# Patient Record
Sex: Female | Born: 1960 | Race: Black or African American | Hispanic: No | Smoking: Former smoker
Health system: Southern US, Community
[De-identification: ages and names within clinical notes are randomized; demographics above are authoritative.]

## PROBLEM LIST (undated history)

## (undated) DIAGNOSIS — F329 Major depressive disorder, single episode, unspecified: Secondary | ICD-10-CM

## (undated) DIAGNOSIS — M1990 Unspecified osteoarthritis, unspecified site: Secondary | ICD-10-CM

## (undated) DIAGNOSIS — N183 Chronic kidney disease, stage 3 unspecified: Secondary | ICD-10-CM

## (undated) DIAGNOSIS — Z9889 Other specified postprocedural states: Secondary | ICD-10-CM

## (undated) DIAGNOSIS — I2699 Other pulmonary embolism without acute cor pulmonale: Secondary | ICD-10-CM

## (undated) DIAGNOSIS — I639 Cerebral infarction, unspecified: Secondary | ICD-10-CM

## (undated) DIAGNOSIS — F4310 Post-traumatic stress disorder, unspecified: Secondary | ICD-10-CM

## (undated) DIAGNOSIS — E119 Type 2 diabetes mellitus without complications: Secondary | ICD-10-CM

## (undated) DIAGNOSIS — J45909 Unspecified asthma, uncomplicated: Secondary | ICD-10-CM

## (undated) DIAGNOSIS — Z86711 Personal history of pulmonary embolism: Secondary | ICD-10-CM

## (undated) DIAGNOSIS — J189 Pneumonia, unspecified organism: Secondary | ICD-10-CM

## (undated) DIAGNOSIS — F319 Bipolar disorder, unspecified: Secondary | ICD-10-CM

## (undated) DIAGNOSIS — T7840XA Allergy, unspecified, initial encounter: Secondary | ICD-10-CM

## (undated) DIAGNOSIS — H409 Unspecified glaucoma: Secondary | ICD-10-CM

## (undated) DIAGNOSIS — G4730 Sleep apnea, unspecified: Secondary | ICD-10-CM

## (undated) DIAGNOSIS — I10 Essential (primary) hypertension: Secondary | ICD-10-CM

## (undated) DIAGNOSIS — I2510 Atherosclerotic heart disease of native coronary artery without angina pectoris: Secondary | ICD-10-CM

## (undated) DIAGNOSIS — I219 Acute myocardial infarction, unspecified: Secondary | ICD-10-CM

## (undated) DIAGNOSIS — F419 Anxiety disorder, unspecified: Secondary | ICD-10-CM

## (undated) DIAGNOSIS — K219 Gastro-esophageal reflux disease without esophagitis: Secondary | ICD-10-CM

## (undated) DIAGNOSIS — G709 Myoneural disorder, unspecified: Secondary | ICD-10-CM

## (undated) DIAGNOSIS — G629 Polyneuropathy, unspecified: Secondary | ICD-10-CM

## (undated) DIAGNOSIS — F32A Depression, unspecified: Secondary | ICD-10-CM

## (undated) DIAGNOSIS — H269 Unspecified cataract: Secondary | ICD-10-CM

## (undated) DIAGNOSIS — M549 Dorsalgia, unspecified: Secondary | ICD-10-CM

## (undated) DIAGNOSIS — D649 Anemia, unspecified: Secondary | ICD-10-CM

## (undated) DIAGNOSIS — F1410 Cocaine abuse, uncomplicated: Secondary | ICD-10-CM

## (undated) HISTORY — DX: Gastro-esophageal reflux disease without esophagitis: K21.9

## (undated) HISTORY — DX: Chronic kidney disease, stage 3 unspecified: N18.30

## (undated) HISTORY — DX: Unspecified cataract: H26.9

## (undated) HISTORY — DX: Allergy, unspecified, initial encounter: T78.40XA

## (undated) HISTORY — DX: Major depressive disorder, single episode, unspecified: F32.9

## (undated) HISTORY — PX: COLONOSCOPY W/ POLYPECTOMY: SHX1380

## (undated) HISTORY — DX: Cocaine abuse, uncomplicated: F14.10

## (undated) HISTORY — PX: COLONOSCOPY: SHX174

## (undated) HISTORY — DX: Cerebral infarction, unspecified: I63.9

## (undated) HISTORY — DX: Unspecified glaucoma: H40.9

## (undated) HISTORY — DX: Type 2 diabetes mellitus without complications: E11.9

## (undated) HISTORY — DX: Chronic kidney disease, stage 3 (moderate): N18.3

## (undated) HISTORY — DX: Anemia, unspecified: D64.9

## (undated) HISTORY — DX: Depression, unspecified: F32.A

## (undated) HISTORY — DX: Atherosclerotic heart disease of native coronary artery without angina pectoris: I25.10

## (undated) HISTORY — PX: DENTAL SURGERY: SHX609

## (undated) HISTORY — PX: TONSILLECTOMY: SUR1361

## (undated) HISTORY — PX: TUBAL LIGATION: SHX77

## (undated) HISTORY — DX: Sleep apnea, unspecified: G47.30

## (undated) HISTORY — DX: Other pulmonary embolism without acute cor pulmonale: I26.99

## (undated) HISTORY — DX: Dorsalgia, unspecified: M54.9

## (undated) HISTORY — PX: POLYPECTOMY: SHX149

## (undated) HISTORY — DX: Personal history of pulmonary embolism: Z86.711

## (undated) NOTE — Telephone Encounter (Signed)
Formatting of this note might be different from the original.  Received new consult from [facility] for worsening renal function. Room 136A  Electronically signed by Eddie Dibbles, LPN at 78/29/5621 12:31 PM EST

## (undated) NOTE — Telephone Encounter (Signed)
Formatting of this note might be different from the original.  Dr. Jomarie Longs of [facility]'s patient   Electronically signed by Officer, Maisie Fus, MD at 11/14/2021  4:14 PM EST

---

## 2003-09-12 DIAGNOSIS — I639 Cerebral infarction, unspecified: Secondary | ICD-10-CM

## 2003-09-12 HISTORY — DX: Cerebral infarction, unspecified: I63.9

## 2012-10-25 ENCOUNTER — Emergency Department (HOSPITAL_COMMUNITY): Payer: Medicaid - Out of State

## 2012-10-25 ENCOUNTER — Emergency Department (HOSPITAL_COMMUNITY)
Admission: EM | Admit: 2012-10-25 | Discharge: 2012-10-26 | Disposition: A | Discharge status: Home / Self Care | Payer: Medicaid - Out of State | Attending: Emergency Medicine | Admitting: Emergency Medicine

## 2012-10-25 ENCOUNTER — Encounter (HOSPITAL_COMMUNITY): Payer: Self-pay | Admitting: Emergency Medicine

## 2012-10-25 DIAGNOSIS — R51 Headache: Secondary | ICD-10-CM | POA: Insufficient documentation

## 2012-10-25 DIAGNOSIS — K219 Gastro-esophageal reflux disease without esophagitis: Secondary | ICD-10-CM | POA: Insufficient documentation

## 2012-10-25 DIAGNOSIS — R197 Diarrhea, unspecified: Secondary | ICD-10-CM | POA: Insufficient documentation

## 2012-10-25 DIAGNOSIS — I10 Essential (primary) hypertension: Secondary | ICD-10-CM | POA: Insufficient documentation

## 2012-10-25 DIAGNOSIS — E119 Type 2 diabetes mellitus without complications: Secondary | ICD-10-CM | POA: Insufficient documentation

## 2012-10-25 DIAGNOSIS — Z87891 Personal history of nicotine dependence: Secondary | ICD-10-CM | POA: Insufficient documentation

## 2012-10-25 DIAGNOSIS — Z8701 Personal history of pneumonia (recurrent): Secondary | ICD-10-CM | POA: Insufficient documentation

## 2012-10-25 DIAGNOSIS — E7800 Pure hypercholesterolemia, unspecified: Secondary | ICD-10-CM | POA: Insufficient documentation

## 2012-10-25 DIAGNOSIS — R42 Dizziness and giddiness: Secondary | ICD-10-CM | POA: Insufficient documentation

## 2012-10-25 DIAGNOSIS — Z760 Encounter for issue of repeat prescription: Secondary | ICD-10-CM

## 2012-10-25 DIAGNOSIS — Z8669 Personal history of other diseases of the nervous system and sense organs: Secondary | ICD-10-CM | POA: Insufficient documentation

## 2012-10-25 DIAGNOSIS — Z79899 Other long term (current) drug therapy: Secondary | ICD-10-CM | POA: Insufficient documentation

## 2012-10-25 HISTORY — DX: Gastro-esophageal reflux disease without esophagitis: K21.9

## 2012-10-25 HISTORY — DX: Pneumonia, unspecified organism: J18.9

## 2012-10-25 HISTORY — DX: Polyneuropathy, unspecified: G62.9

## 2012-10-25 HISTORY — DX: Essential (primary) hypertension: I10

## 2012-10-25 MED ORDER — HYDROMORPHONE HCL PF 1 MG/ML IJ SOLN
1.0000 mg | Freq: Once | INTRAMUSCULAR | Status: AC
  Administered 2012-10-26: 1 mg via INTRAVENOUS
  Filled 2012-10-25: qty 1

## 2012-10-25 MED ORDER — SODIUM CHLORIDE 0.9 % IV SOLN: INTRAVENOUS | Status: DC

## 2012-10-25 MED ORDER — ONDANSETRON HCL 4 MG/2ML IJ SOLN
4.0000 mg | Freq: Once | INTRAMUSCULAR | Status: AC
  Administered 2012-10-26: 4 mg via INTRAVENOUS
  Filled 2012-10-25: qty 2

## 2012-10-25 MED ORDER — SODIUM CHLORIDE 0.9 % IV BOLUS (SEPSIS)
1000.0000 mL | Freq: Once | INTRAVENOUS | Status: AC
  Administered 2012-10-26: 1000 mL via INTRAVENOUS

## 2012-10-25 NOTE — ED Notes (Signed)
PT reports out of medication x 3 weeks.  Recently moved to [facility]- no PCP and No refills on medication.  Reports dizziness x 2 days that pt relates to not taking medication.  No neuro deficits noted.

## 2012-10-25 NOTE — ED Notes (Addendum)
Pt out of all medications, c/o dizziness today "and I about blacked out yesterday but I didn't come." Denies pain. Pt states dizziness is intermittent, denies any dizziness today. Pt states that nothing makes dizziness better or worse.

## 2012-10-26 LAB — CBC WITH DIFFERENTIAL/PLATELET
Eosinophils Absolute: 0.1 10*3/uL (ref 0.0–0.7)
Eosinophils Relative: 1 % (ref 0–5)
HCT: 35.5 % — ABNORMAL LOW (ref 36.0–46.0)
Hemoglobin: 11.9 g/dL — ABNORMAL LOW (ref 12.0–15.0)
Lymphs Abs: 3.1 10*3/uL (ref 0.7–4.0)
MCH: 29.7 pg (ref 26.0–34.0)
MCV: 88.5 fL (ref 78.0–100.0)
Monocytes Absolute: 0.7 10*3/uL (ref 0.1–1.0)
Monocytes Relative: 6 % (ref 3–12)
Neutrophils Relative %: 62 % (ref 43–77)
RBC: 4.01 MIL/uL (ref 3.87–5.11)

## 2012-10-26 LAB — BASIC METABOLIC PANEL
BUN: 27 mg/dL — ABNORMAL HIGH (ref 6–23)
GFR calc non Af Amer: 71 mL/min — ABNORMAL LOW (ref >90)
Glucose, Bld: 208 mg/dL — ABNORMAL HIGH (ref 70–99)
Potassium: 3.9 mEq/L (ref 3.5–5.1)

## 2012-10-26 LAB — URINALYSIS, ROUTINE W REFLEX MICROSCOPIC
Bilirubin Urine: NEGATIVE
Glucose, UA: NEGATIVE mg/dL
Hgb urine dipstick: NEGATIVE
Specific Gravity, Urine: 1.019 (ref 1.005–1.030)
pH: 5 (ref 5.0–8.0)

## 2012-10-26 LAB — URINE MICROSCOPIC-ADD ON

## 2012-10-26 MED ORDER — OMEPRAZOLE 20 MG PO CPDR: 20.0000 mg | DELAYED_RELEASE_CAPSULE | Freq: Every day | ORAL | Status: DC

## 2012-10-26 MED ORDER — HYDROMORPHONE HCL PF 1 MG/ML IJ SOLN: 1.0000 mg | Freq: Once | INTRAMUSCULAR | Status: DC

## 2012-10-26 MED ORDER — AMLODIPINE BESYLATE 5 MG PO TABS
5.0000 mg | ORAL_TABLET | Freq: Once | ORAL | Status: AC
  Administered 2012-10-26: 5 mg via ORAL
  Filled 2012-10-26: qty 1

## 2012-10-26 MED ORDER — GLYBURIDE 5 MG PO TABS: 2.5000 mg | ORAL_TABLET | Freq: Every day | ORAL | Status: DC

## 2012-10-26 MED ORDER — AMLODIPINE BESYLATE 10 MG PO TABS: 5.0000 mg | ORAL_TABLET | Freq: Every day | ORAL | Status: DC

## 2012-10-26 MED ORDER — ONDANSETRON HCL 8 MG PO TABS
4.0000 mg | ORAL_TABLET | Freq: Once | ORAL | Status: AC
  Administered 2012-10-26: 02:00:00 via ORAL
  Filled 2012-10-26: qty 2

## 2012-10-26 NOTE — ED Provider Notes (Signed)
History     CSN: [id number]  Arrival date & time 10/25/12  7829   First MD Initiated Contact with Patient 10/25/12 2309      Chief Complaint  Patient presents with  . Dizziness  . out of medication     (Consider location/radiation/quality/duration/timing/severity/associated sxs/prior treatment) The history is provided by the patient.   52 year old female new to the area is been a cone of her medications for the past 3 weeks. Has had gradual onset of dizziness and mild headache for the past several days. Worse yesterday. No chest pain no shortness of breath no abdominal pain. Has baseline loose bowel movements described as diarrhea. No nausea and vomiting positive urinary frequency. Headache is described as mild 5/10. All of the head.   Patient has a history of hypertension and diabetes. She does not have a doctor locally.  Past Medical History  Diagnosis Date  . Hypertension   . High cholesterol   . Diabetes mellitus without complication   . Neuropathy   . Acid reflux   . Pneumonia     Past Surgical History  Procedure Laterality Date  . Tubal ligation      No family history on file.  History  Substance Use Topics  . Smoking status: Former Games developer  . Smokeless tobacco: Not on file  . Alcohol Use: No    OB History   Grav Para Term Preterm Abortions TAB SAB Ect Mult Living                  Review of Systems  Constitutional: Negative for fever and diaphoresis.  HENT: Negative for ear pain, congestion and neck pain.   Eyes: Negative for visual disturbance.  Respiratory: Negative for shortness of breath.   Cardiovascular: Negative for chest pain and leg swelling.  Gastrointestinal: Positive for diarrhea. Negative for nausea, vomiting and abdominal pain.  Genitourinary: Positive for frequency. Negative for dysuria.  Musculoskeletal: Negative for back pain.  Skin: Negative for rash.  Neurological: Positive for dizziness and headaches. Negative for syncope, speech  difficulty, weakness and numbness.    Allergies  Lisinopril; Metformin and related; and Sulfa antibiotics  Home Medications   Current Outpatient Rx  Name  Route  Sig  Dispense  Refill  . amLODipine (NORVASC) 5 MG tablet   Oral   Take 5 mg by mouth daily.         . ATORVASTATIN CALCIUM PO   Oral   Take 1 tablet by mouth daily.         Marland Kitchen glyBURIDE (DIABETA) 2.5 MG tablet   Oral   Take 2.5 mg by mouth daily with breakfast.         . loperamide (IMODIUM) 2 MG capsule   Oral   Take 2 mg by mouth 4 (four) times daily as needed for diarrhea or loose stools.         Marland Kitchen MAGNESIUM PO   Oral   Take 1 tablet by mouth daily.         Marland Kitchen omeprazole (PRILOSEC) 20 MG capsule   Oral   Take 20 mg by mouth daily.         Marland Kitchen amLODipine (NORVASC) 10 MG tablet   Oral   Take 0.5 tablets (5 mg total) by mouth daily.   30 tablet   3   . glyBURIDE (DIABETA) 5 MG tablet   Oral   Take 0.5 tablets (2.5 mg total) by mouth daily with breakfast.   30 tablet  3   . omeprazole (PRILOSEC) 20 MG capsule   Oral   Take 1 capsule (20 mg total) by mouth daily.   30 capsule   3     BP 150/94  Pulse 93  Temp(Src) 97.7 F (36.5 C) (Oral)  Resp 18  SpO2 97%  LMP 10/18/2012  Physical Exam  Constitutional: She is oriented to person, place, and time. She appears well-developed and well-nourished.  HENT:  Head: Normocephalic and atraumatic.  Eyes: Conjunctivae and EOM are normal. Pupils are equal, round, and reactive to light.  Neck: Normal range of motion. Neck supple.  Cardiovascular: Normal rate, regular rhythm, normal heart sounds and intact distal pulses.   No murmur heard. Pulmonary/Chest: Effort normal and breath sounds normal. No respiratory distress.  Abdominal: Soft. Bowel sounds are normal. There is no tenderness.  Musculoskeletal: Normal range of motion. She exhibits no edema.  Neurological: She is alert and oriented to person, place, and time. No cranial nerve deficit.  She exhibits normal muscle tone. Coordination normal.  Skin: Skin is warm.    ED Course  Procedures (including critical care time)  Labs Reviewed  GLUCOSE, CAPILLARY - Abnormal; Notable for the following:    Glucose-Capillary 154 (*)    All other components within normal limits  URINALYSIS, ROUTINE W REFLEX MICROSCOPIC - Abnormal; Notable for the following:    Protein, ur 100 (*)    All other components within normal limits  CBC WITH DIFFERENTIAL - Abnormal; Notable for the following:    Hemoglobin 11.9 (*)    HCT 35.5 (*)    All other components within normal limits  BASIC METABOLIC PANEL - Abnormal; Notable for the following:    Sodium 133 (*)    Glucose, Bld 208 (*)    BUN 27 (*)    GFR calc non Af Amer 71 (*)    GFR calc Af Amer 82 (*)    All other components within normal limits  URINE MICROSCOPIC-ADD ON - Abnormal; Notable for the following:    Squamous Epithelial / LPF MANY (*)    All other components within normal limits   Ct Head Wo Contrast  10/26/2012  *RADIOLOGY REPORT*  Clinical Data: Frontal headache and dizziness; blurred vision.  CT HEAD WITHOUT CONTRAST  Technique:  Contiguous axial images were obtained from the base of the skull through the vertex without contrast.  Comparison: None.  Findings: There is no evidence of acute infarction, mass lesion, or intra- or extra-axial hemorrhage on CT.  The posterior fossa, including the cerebellum, brainstem and fourth ventricle, is within normal limits.  The third and lateral ventricles, and basal ganglia are unremarkable in appearance.  The cerebral hemispheres are symmetric in appearance, with normal gray- white differentiation.  No mass effect or midline shift is seen.  There is no evidence of fracture; visualized osseous structures are unremarkable in appearance.  The visualized portions of the orbits are within normal limits.  The paranasal sinuses and mastoid air cells are well-aerated.  No significant soft tissue  abnormalities are seen.  IMPRESSION: Unremarkable noncontrast CT of the head.   Original Report Authenticated By: Tonia Ghent, M.D.    Results for orders placed during the hospital encounter of 10/25/12  GLUCOSE, CAPILLARY      Result Value Range   Glucose-Capillary 154 (*) 70 - 99 mg/dL   Comment 1 Notify RN     Comment 2 Documented in Chart    URINALYSIS, ROUTINE W REFLEX MICROSCOPIC  Result Value Range   Color, Urine YELLOW  YELLOW   APPearance CLEAR  CLEAR   Specific Gravity, Urine 1.019  1.005 - 1.030   pH 5.0  5.0 - 8.0   Glucose, UA NEGATIVE  NEGATIVE mg/dL   Hgb urine dipstick NEGATIVE  NEGATIVE   Bilirubin Urine NEGATIVE  NEGATIVE   Ketones, ur NEGATIVE  NEGATIVE mg/dL   Protein, ur 161 (*) NEGATIVE mg/dL   Urobilinogen, UA 0.2  0.0 - 1.0 mg/dL   Nitrite NEGATIVE  NEGATIVE   Leukocytes, UA NEGATIVE  NEGATIVE  CBC WITH DIFFERENTIAL      Result Value Range   WBC 10.3  4.0 - 10.5 K/uL   RBC 4.01  3.87 - 5.11 MIL/uL   Hemoglobin 11.9 (*) 12.0 - 15.0 g/dL   HCT 09.6 (*) 04.5 - 40.9 %   MCV 88.5  78.0 - 100.0 fL   MCH 29.7  26.0 - 34.0 pg   MCHC 33.5  30.0 - 36.0 g/dL   RDW 81.1  91.4 - 78.2 %   Platelets 214  150 - 400 K/uL   Neutrophils Relative 62  43 - 77 %   Neutro Abs 6.4  1.7 - 7.7 K/uL   Lymphocytes Relative 30  12 - 46 %   Lymphs Abs 3.1  0.7 - 4.0 K/uL   Monocytes Relative 6  3 - 12 %   Monocytes Absolute 0.7  0.1 - 1.0 K/uL   Eosinophils Relative 1  0 - 5 %   Eosinophils Absolute 0.1  0.0 - 0.7 K/uL   Basophils Relative 0  0 - 1 %   Basophils Absolute 0.0  0.0 - 0.1 K/uL  BASIC METABOLIC PANEL      Result Value Range   Sodium 133 (*) 135 - 145 mEq/L   Potassium 3.9  3.5 - 5.1 mEq/L   Chloride 99  96 - 112 mEq/L   CO2 19  19 - 32 mEq/L   Glucose, Bld 208 (*) 70 - 99 mg/dL   BUN 27 (*) 6 - 23 mg/dL   Creatinine, Ser 9.56  0.50 - 1.10 mg/dL   Calcium 8.8  8.4 - 21.3 mg/dL   GFR calc non Af Amer 71 (*) >90 mL/min   GFR calc Af Amer 82 (*) >90  mL/min  URINE MICROSCOPIC-ADD ON      Result Value Range   Squamous Epithelial / LPF MANY (*) RARE   WBC, UA 0-2  <3 WBC/hpf   RBC / HPF 0-2  <3 RBC/hpf   Bacteria, UA RARE  RARE     1. Dizziness   2. Hypertension   3. Medication refill       MDM   Patient new to area out of medications for the past 3 weeks. Complaint of mild headache and dizziness for the past 2 days. No focal neuro deficits. Head CT was negative a sig labs without significant abnormalities. Patient's medications renewed that her Norvasc for her hypertension the parietal her diabetes and Prilosec. Resource guide provided. Patient in no acute distress nontoxic. Blood pressure was elevated to improve some in the ED without treatment. Patient given first dose in Norvasc here in the emergency department prior to discharge. She'll start taking her diabetic medications in the morning. No vertigo.     Shelda Jakes, MD 10/26/12 [phone]

## 2012-12-12 ENCOUNTER — Encounter (HOSPITAL_COMMUNITY): Payer: Self-pay | Admitting: Emergency Medicine

## 2012-12-12 ENCOUNTER — Emergency Department (HOSPITAL_COMMUNITY): Payer: Medicaid - Out of State

## 2012-12-12 ENCOUNTER — Emergency Department (HOSPITAL_COMMUNITY)
Admission: EM | Admit: 2012-12-12 | Discharge: 2012-12-12 | Disposition: A | Discharge status: Home / Self Care | Payer: Medicaid - Out of State | Attending: Emergency Medicine | Admitting: Emergency Medicine

## 2012-12-12 DIAGNOSIS — Z3202 Encounter for pregnancy test, result negative: Secondary | ICD-10-CM | POA: Insufficient documentation

## 2012-12-12 DIAGNOSIS — R0602 Shortness of breath: Secondary | ICD-10-CM | POA: Insufficient documentation

## 2012-12-12 DIAGNOSIS — Q674 Other congenital deformities of skull, face and jaw: Secondary | ICD-10-CM | POA: Insufficient documentation

## 2012-12-12 DIAGNOSIS — K219 Gastro-esophageal reflux disease without esophagitis: Secondary | ICD-10-CM | POA: Insufficient documentation

## 2012-12-12 DIAGNOSIS — Z7982 Long term (current) use of aspirin: Secondary | ICD-10-CM | POA: Insufficient documentation

## 2012-12-12 DIAGNOSIS — Z87828 Personal history of other (healed) physical injury and trauma: Secondary | ICD-10-CM | POA: Insufficient documentation

## 2012-12-12 DIAGNOSIS — M79609 Pain in unspecified limb: Secondary | ICD-10-CM | POA: Insufficient documentation

## 2012-12-12 DIAGNOSIS — E1139 Type 2 diabetes mellitus with other diabetic ophthalmic complication: Secondary | ICD-10-CM | POA: Insufficient documentation

## 2012-12-12 DIAGNOSIS — E7800 Pure hypercholesterolemia, unspecified: Secondary | ICD-10-CM | POA: Insufficient documentation

## 2012-12-12 DIAGNOSIS — E11319 Type 2 diabetes mellitus with unspecified diabetic retinopathy without macular edema: Secondary | ICD-10-CM | POA: Insufficient documentation

## 2012-12-12 DIAGNOSIS — Z8701 Personal history of pneumonia (recurrent): Secondary | ICD-10-CM | POA: Insufficient documentation

## 2012-12-12 DIAGNOSIS — R079 Chest pain, unspecified: Secondary | ICD-10-CM | POA: Insufficient documentation

## 2012-12-12 DIAGNOSIS — I10 Essential (primary) hypertension: Secondary | ICD-10-CM | POA: Insufficient documentation

## 2012-12-12 DIAGNOSIS — E669 Obesity, unspecified: Secondary | ICD-10-CM | POA: Insufficient documentation

## 2012-12-12 DIAGNOSIS — Z79899 Other long term (current) drug therapy: Secondary | ICD-10-CM | POA: Insufficient documentation

## 2012-12-12 DIAGNOSIS — Z87891 Personal history of nicotine dependence: Secondary | ICD-10-CM | POA: Insufficient documentation

## 2012-12-12 DIAGNOSIS — Z8669 Personal history of other diseases of the nervous system and sense organs: Secondary | ICD-10-CM | POA: Insufficient documentation

## 2012-12-12 LAB — CBC
MCH: 28.9 pg (ref 26.0–34.0)
Platelets: 228 10*3/uL (ref 150–400)
RBC: 4.18 MIL/uL (ref 3.87–5.11)
RDW: 13.8 % (ref 11.5–15.5)
WBC: 7.4 10*3/uL (ref 4.0–10.5)

## 2012-12-12 LAB — RAPID URINE DRUG SCREEN, HOSP PERFORMED
Cocaine: NOT DETECTED
Opiates: POSITIVE — AB
Tetrahydrocannabinol: NOT DETECTED

## 2012-12-12 LAB — POCT PREGNANCY, URINE: Preg Test, Ur: NEGATIVE

## 2012-12-12 LAB — TROPONIN I: Troponin I: <0.3 ng/mL (ref <0.30)

## 2012-12-12 LAB — BASIC METABOLIC PANEL
CO2: 23 mEq/L (ref 19–32)
Calcium: 8.8 mg/dL (ref 8.4–10.5)
Creatinine, Ser: 0.79 mg/dL (ref 0.50–1.10)
GFR calc Af Amer: >90 mL/min (ref >90)
Sodium: 132 mEq/L — ABNORMAL LOW (ref 135–145)

## 2012-12-12 LAB — POCT I-STAT TROPONIN I

## 2012-12-12 MED ORDER — MORPHINE SULFATE 4 MG/ML IJ SOLN
INTRAMUSCULAR | Status: AC
  Filled 2012-12-12: qty 1

## 2012-12-12 MED ORDER — MORPHINE SULFATE 4 MG/ML IJ SOLN
4.0000 mg | Freq: Once | INTRAMUSCULAR | Status: AC
  Administered 2012-12-12: 4 mg via INTRAVENOUS
  Filled 2012-12-12: qty 1

## 2012-12-12 MED ORDER — MORPHINE SULFATE 4 MG/ML IJ SOLN
4.0000 mg | Freq: Once | INTRAMUSCULAR | Status: AC
  Administered 2012-12-12: 4 mg via INTRAVENOUS

## 2012-12-12 MED ORDER — ASPIRIN 81 MG PO CHEW
324.0000 mg | CHEWABLE_TABLET | Freq: Once | ORAL | Status: AC
  Administered 2012-12-12: 324 mg via ORAL
  Filled 2012-12-12: qty 4

## 2012-12-12 MED ORDER — POTASSIUM CHLORIDE 20 MEQ/15ML (10%) PO LIQD
40.0000 meq | Freq: Once | ORAL | Status: AC
  Administered 2012-12-12: 40 meq via ORAL

## 2012-12-12 MED ORDER — NITROGLYCERIN 0.4 MG SL SUBL
0.4000 mg | SUBLINGUAL_TABLET | SUBLINGUAL | Status: AC | PRN
  Administered 2012-12-12 (×3): 0.4 mg via SUBLINGUAL

## 2012-12-12 MED ORDER — POTASSIUM CHLORIDE 20 MEQ/15ML (10%) PO LIQD
ORAL | Status: AC
  Filled 2012-12-12: qty 30

## 2012-12-12 MED ORDER — HYDROCODONE-ACETAMINOPHEN 5-325 MG PO TABS: 1.0000 | ORAL_TABLET | Freq: Four times a day (QID) | ORAL | Status: DC | PRN

## 2012-12-12 NOTE — ED Notes (Signed)
Patient transported to X-ray 

## 2012-12-12 NOTE — ED Notes (Signed)
Patient c/o of substernal left sided chest pain that radiates there her left arm and neck.  Patient reports chest pain started yesterday evening but got progressively worse this morning. Patient complaining of nausea and SOB.  Hx of hypertension, diabetes elevated cholesterol.

## 2012-12-12 NOTE — ED Notes (Signed)
Pt dc'd home w/all belongings, alert and ambulatory upon dc, pt verbalizes understanding of dc instructions, 1 new rx prescribed, bus pass given

## 2012-12-12 NOTE — ED Notes (Signed)
Ice chips given per EDPA Cammy Copa

## 2012-12-12 NOTE — ED Notes (Signed)
Patient is sleeping comfortably. This RN woke pt to assess her pain, pt reports pain a 7/10. Pt originally answered "better" and when asked for a number scale pt states "7"
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
## 2012-12-16 NOTE — ED Provider Notes (Signed)
Medical screening examination/treatment/procedure(s) were performed by non-physician practitioner and as supervising physician I was immediately available for consultation/collaboration.  Juliet Rude. Rubin Payor, MD 12/16/12 [phone]

## 2013-04-23 ENCOUNTER — Encounter (HOSPITAL_COMMUNITY): Payer: Self-pay | Admitting: Emergency Medicine

## 2013-04-23 ENCOUNTER — Emergency Department (HOSPITAL_COMMUNITY)
Admission: EM | Admit: 2013-04-23 | Discharge: 2013-04-24 | Disposition: A | Discharge status: Home / Self Care | Payer: Self-pay | Attending: Emergency Medicine | Admitting: Emergency Medicine

## 2013-04-23 ENCOUNTER — Emergency Department (HOSPITAL_COMMUNITY): Payer: Self-pay

## 2013-04-23 DIAGNOSIS — R197 Diarrhea, unspecified: Secondary | ICD-10-CM | POA: Insufficient documentation

## 2013-04-23 DIAGNOSIS — R1110 Vomiting, unspecified: Secondary | ICD-10-CM

## 2013-04-23 DIAGNOSIS — R05 Cough: Secondary | ICD-10-CM

## 2013-04-23 DIAGNOSIS — Z8669 Personal history of other diseases of the nervous system and sense organs: Secondary | ICD-10-CM | POA: Insufficient documentation

## 2013-04-23 DIAGNOSIS — Z79899 Other long term (current) drug therapy: Secondary | ICD-10-CM | POA: Insufficient documentation

## 2013-04-23 DIAGNOSIS — Z7982 Long term (current) use of aspirin: Secondary | ICD-10-CM | POA: Insufficient documentation

## 2013-04-23 DIAGNOSIS — R112 Nausea with vomiting, unspecified: Secondary | ICD-10-CM | POA: Insufficient documentation

## 2013-04-23 DIAGNOSIS — I10 Essential (primary) hypertension: Secondary | ICD-10-CM | POA: Insufficient documentation

## 2013-04-23 DIAGNOSIS — E7800 Pure hypercholesterolemia, unspecified: Secondary | ICD-10-CM | POA: Insufficient documentation

## 2013-04-23 DIAGNOSIS — Z87891 Personal history of nicotine dependence: Secondary | ICD-10-CM | POA: Insufficient documentation

## 2013-04-23 DIAGNOSIS — R059 Cough, unspecified: Secondary | ICD-10-CM | POA: Insufficient documentation

## 2013-04-23 DIAGNOSIS — K219 Gastro-esophageal reflux disease without esophagitis: Secondary | ICD-10-CM | POA: Insufficient documentation

## 2013-04-23 DIAGNOSIS — E119 Type 2 diabetes mellitus without complications: Secondary | ICD-10-CM | POA: Insufficient documentation

## 2013-04-23 DIAGNOSIS — Z8701 Personal history of pneumonia (recurrent): Secondary | ICD-10-CM | POA: Insufficient documentation

## 2013-04-23 LAB — URINALYSIS, ROUTINE W REFLEX MICROSCOPIC
Glucose, UA: 100 mg/dL — AB
Ketones, ur: NEGATIVE mg/dL
Nitrite: NEGATIVE
Specific Gravity, Urine: 1.018 (ref 1.005–1.030)
pH: 5.5 (ref 5.0–8.0)

## 2013-04-23 LAB — COMPREHENSIVE METABOLIC PANEL
ALT: 12 U/L (ref 0–35)
Alkaline Phosphatase: 101 U/L (ref 39–117)
BUN: 17 mg/dL (ref 6–23)
CO2: 19 mEq/L (ref 19–32)
Chloride: 100 mEq/L (ref 96–112)
GFR calc Af Amer: 76 mL/min — ABNORMAL LOW (ref >90)
Glucose, Bld: 297 mg/dL — ABNORMAL HIGH (ref 70–99)
Potassium: 3.8 mEq/L (ref 3.5–5.1)
Sodium: 133 mEq/L — ABNORMAL LOW (ref 135–145)
Total Bilirubin: 0.1 mg/dL — ABNORMAL LOW (ref 0.3–1.2)
Total Protein: 7.3 g/dL (ref 6.0–8.3)

## 2013-04-23 LAB — URINE MICROSCOPIC-ADD ON

## 2013-04-23 LAB — CBC WITH DIFFERENTIAL/PLATELET
Hemoglobin: 12.6 g/dL (ref 12.0–15.0)
Lymphocytes Relative: 32 % (ref 12–46)
Lymphs Abs: 3.1 10*3/uL (ref 0.7–4.0)
MCH: 29.8 pg (ref 26.0–34.0)
Monocytes Relative: 5 % (ref 3–12)
Neutro Abs: 6 10*3/uL (ref 1.7–7.7)
Neutrophils Relative %: 62 % (ref 43–77)
Platelets: 228 10*3/uL (ref 150–400)
RBC: 4.23 MIL/uL (ref 3.87–5.11)
WBC: 9.7 10*3/uL (ref 4.0–10.5)

## 2013-04-23 MED ORDER — BENZONATATE 100 MG PO CAPS
200.0000 mg | ORAL_CAPSULE | Freq: Three times a day (TID) | ORAL | Status: DC | PRN
  Administered 2013-04-24: 200 mg via ORAL
  Filled 2013-04-23: qty 2

## 2013-04-23 MED ORDER — GUAIFENESIN ER 600 MG PO TB12
1200.0000 mg | ORAL_TABLET | Freq: Once | ORAL | Status: AC
Start: 2013-04-24 — End: 2013-04-24
  Administered 2013-04-24: 1200 mg via ORAL
  Filled 2013-04-23: qty 2

## 2013-04-23 NOTE — ED Notes (Signed)
Lower abd pain x 2 weeks  Hurts to pee and lots of diarrhea

## 2013-04-23 NOTE — ED Notes (Signed)
NURSE FIRST ROUNDS : NURSE EXPLAINED DELAY , WAIT TIME AND PROCESS TO PT. RESPIRATIONS UNLABORED / DENIES PAIN AT THIS TIME .  

## 2013-04-24 MED ORDER — GUAIFENESIN ER 600 MG PO TB12: 1200.0000 mg | ORAL_TABLET | Freq: Two times a day (BID) | ORAL | Status: DC

## 2013-04-24 MED ORDER — BENZONATATE 200 MG PO CAPS: 200.0000 mg | ORAL_CAPSULE | Freq: Three times a day (TID) | ORAL | Status: DC | PRN

## 2013-04-24 MED ORDER — HYDROCOD POLST-CHLORPHEN POLST 10-8 MG/5ML PO LQCR: 5.0000 mL | Freq: Two times a day (BID) | ORAL | Status: DC | PRN

## 2013-04-24 NOTE — ED Provider Notes (Signed)
CSN: [id number]     Arrival date & time 04/23/13  1809 History     First MD Initiated Contact with Patient 04/23/13 2259     Chief Complaint  Patient presents with  . Nausea  . Emesis  . Abdominal Pain   (Consider location/radiation/quality/duration/timing/severity/associated sxs/prior Treatment) HPI 52 yo female presents to the ER with complaint of nausea, post tussive emesis, diarrhea, and lower abdominal pain ongoing for the last 2 weeks.  Pt is tolerating food and liquids, only vomiting up clear sputum/phlegm after coughing.  Stools are loose, mucousy.  She reports she was seen by her doctor at [facility] family practice today who sent her to the ER for "xrays to rule out a blockage".  No fevers.  Coughing and phlegm is worse with lying down.  No OTC cold medications taken over the last 2 weeks.  Pt is not a smoker.  Pain in lower abdomen is in bilateral lower abd.  She has slight pain with urination.   Past Medical History  Diagnosis Date  . Hypertension   . High cholesterol   . Diabetes mellitus without complication   . Neuropathy   . Acid reflux   . Pneumonia    Past Surgical History  Procedure Laterality Date  . Tubal ligation     No family history on file. History  Substance Use Topics  . Smoking status: Former Games developer  . Smokeless tobacco: Not on file  . Alcohol Use: No   OB History   Grav Para Term Preterm Abortions TAB SAB Ect Mult Living                 Review of Systems  All other systems reviewed and are negative.    Allergies  Lisinopril; Metformin and related; and Sulfa antibiotics  Home Medications   Current Outpatient Rx  Name  Route  Sig  Dispense  Refill  . amLODipine (NORVASC) 5 MG tablet   Oral   Take 5 mg by mouth daily.         Marland Kitchen aspirin 325 MG tablet   Oral   Take 325 mg by mouth daily.         . cholecalciferol (VITAMIN D) 1000 UNITS tablet   Oral   Take 1,000 Units by mouth 2 (two) times daily.         Marland Kitchen gabapentin  (NEURONTIN) 300 MG capsule   Oral   Take 300 mg by mouth 2 (two) times daily.         Marland Kitchen glipiZIDE (GLUCOTROL) 10 MG tablet   Oral   Take 10 mg by mouth 2 (two) times daily before a meal.         . MAGNESIUM PO   Oral   Take 1 tablet by mouth daily.         . Melatonin 3 MG CAPS   Oral   Take 3 mg by mouth at bedtime.         . Multiple Vitamins-Calcium (ONE-A-DAY WOMENS PO)   Oral   Take 1 tablet by mouth daily.         . Multiple Vitamins-Minerals (MULTIVITAMIN WITH MINERALS) tablet   Oral   Take 1 tablet by mouth daily.         Marland Kitchen ofloxacin (OCUFLOX) 0.3 % ophthalmic solution   Left Eye   Place 1 drop into the left eye 4 (four) times daily.         Marland Kitchen omeprazole (PRILOSEC) 20 MG capsule  Oral   Take 20 mg by mouth daily.         Marland Kitchen PARoxetine (PAXIL) 20 MG tablet   Oral   Take 20 mg by mouth every morning.          BP 144/85  Pulse 88  Temp(Src) 98 F (36.7 C) (Oral)  Resp 22  SpO2 99%  LMP 04/09/2013 Physical Exam  Nursing note and vitals reviewed. Constitutional: She is oriented to person, place, and time. She appears well-developed and well-nourished.  HENT:  Head: Normocephalic and atraumatic.  Nose: Nose normal.  Mouth/Throat: Oropharynx is clear and moist.  Eyes: Conjunctivae and EOM are normal. Pupils are equal, round, and reactive to light.  Neck: Normal range of motion. Neck supple. No JVD present. No tracheal deviation present. No thyromegaly present.  Cardiovascular: Normal rate, regular rhythm, normal heart sounds and intact distal pulses.  Exam reveals no gallop and no friction rub.   No murmur heard. Pulmonary/Chest: Effort normal and breath sounds normal. No stridor. No respiratory distress. She has no wheezes. She has no rales. She exhibits no tenderness.  Abdominal: Soft. Bowel sounds are normal. She exhibits no distension and no mass. There is tenderness (mild lower abd pain, RLQ and LLQ, not so much suprapubic). There is no  rebound and no guarding.  Musculoskeletal: Normal range of motion. She exhibits no edema and no tenderness.  Lymphadenopathy:    She has no cervical adenopathy.  Neurological: She is alert and oriented to person, place, and time. She exhibits normal muscle tone. Coordination normal.  Skin: Skin is warm and dry. No rash noted. No erythema. No pallor.  Psychiatric: She has a normal mood and affect. Her behavior is normal. Judgment and thought content normal.    ED Course   Procedures (including critical care time)  Labs Reviewed  COMPREHENSIVE METABOLIC PANEL - Abnormal; Notable for the following:    Sodium 133 (*)    Glucose, Bld 297 (*)    Albumin 2.9 (*)    Total Bilirubin 0.1 (*)    GFR calc non Af Amer 66 (*)    GFR calc Af Amer 76 (*)    All other components within normal limits  URINALYSIS, ROUTINE W REFLEX MICROSCOPIC - Abnormal; Notable for the following:    Glucose, UA 100 (*)    Hgb urine dipstick SMALL (*)    Leukocytes, UA TRACE (*)    All other components within normal limits  URINE MICROSCOPIC-ADD ON - Abnormal; Notable for the following:    Squamous Epithelial / LPF FEW (*)    Bacteria, UA FEW (*)    All other components within normal limits  URINE CULTURE  CBC WITH DIFFERENTIAL  LIPASE, BLOOD   Dg Chest 2 View  04/23/2013   *RADIOLOGY REPORT*  Clinical Data: Cough and shortness of breath.  Abdominal pain.  CHEST - 2 VIEW  Comparison: Chest x-ray 12/12/2012.  Findings: Lung volumes are normal.  No consolidative airspace disease.  No pleural effusions.  No pneumothorax.  No pulmonary nodule or mass noted.  Pulmonary vasculature and the cardiomediastinal silhouette are within normal limits.  IMPRESSION: 1. No radiographic evidence of acute cardiopulmonary disease.   Original Report Authenticated By: Trudie Reed, M.D.   Dg Abd 2 Views  04/23/2013   *RADIOLOGY REPORT*  Clinical Data: Abdominal pain.  Nausea and vomiting.  ABDOMEN - 2 VIEW  Comparison: No priors.   Findings: Gas and stool are seen scattered throughout the colon extending to the level  of the distal rectum.  No pathologic distension of small bowel is noted.  No gross evidence of pneumoperitoneum.  IMPRESSION: 1.  Nonobstructive bowel gas pattern. 2.  No pneumoperitoneum.   Original Report Authenticated By: Trudie Reed, M.D.   1. Diarrhea   2. Post-tussive emesis   3. Cough     MDM  52 yo female with 2 weeks of cough, post tussive emesis along with lower abdominal pain, diarrhea.  Labs reassuring.  Some LE, few wbc, will hold off on treatment of UTI until culture is positive.  No pna or sbo noted.  Will treat with tessalon, tussinex, mucinex.  Tamara Mackie, MD 04/24/13 0030

## 2013-04-25 LAB — URINE CULTURE

## 2013-06-20 ENCOUNTER — Emergency Department (INDEPENDENT_AMBULATORY_CARE_PROVIDER_SITE_OTHER)
Admission: EM | Admit: 2013-06-20 | Discharge: 2013-06-20 | Disposition: A | Discharge status: Home / Self Care | Payer: No Typology Code available for payment source | Source: Home / Self Care | Attending: Family Medicine | Admitting: Family Medicine

## 2013-06-20 ENCOUNTER — Encounter (HOSPITAL_COMMUNITY): Payer: Self-pay | Admitting: Emergency Medicine

## 2013-06-20 ENCOUNTER — Emergency Department (HOSPITAL_COMMUNITY)
Admission: EM | Admit: 2013-06-20 | Discharge: 2013-06-20 | Disposition: A | Discharge status: Home / Self Care | Payer: No Typology Code available for payment source | Attending: Emergency Medicine | Admitting: Emergency Medicine

## 2013-06-20 ENCOUNTER — Emergency Department (HOSPITAL_COMMUNITY): Payer: No Typology Code available for payment source

## 2013-06-20 DIAGNOSIS — R05 Cough: Secondary | ICD-10-CM | POA: Insufficient documentation

## 2013-06-20 DIAGNOSIS — I10 Essential (primary) hypertension: Secondary | ICD-10-CM | POA: Insufficient documentation

## 2013-06-20 DIAGNOSIS — R112 Nausea with vomiting, unspecified: Secondary | ICD-10-CM | POA: Insufficient documentation

## 2013-06-20 DIAGNOSIS — K219 Gastro-esophageal reflux disease without esophagitis: Secondary | ICD-10-CM | POA: Insufficient documentation

## 2013-06-20 DIAGNOSIS — R197 Diarrhea, unspecified: Secondary | ICD-10-CM | POA: Insufficient documentation

## 2013-06-20 DIAGNOSIS — Z87891 Personal history of nicotine dependence: Secondary | ICD-10-CM | POA: Insufficient documentation

## 2013-06-20 DIAGNOSIS — Z3202 Encounter for pregnancy test, result negative: Secondary | ICD-10-CM | POA: Insufficient documentation

## 2013-06-20 DIAGNOSIS — Z79899 Other long term (current) drug therapy: Secondary | ICD-10-CM | POA: Insufficient documentation

## 2013-06-20 DIAGNOSIS — R059 Cough, unspecified: Secondary | ICD-10-CM | POA: Insufficient documentation

## 2013-06-20 DIAGNOSIS — E119 Type 2 diabetes mellitus without complications: Secondary | ICD-10-CM | POA: Insufficient documentation

## 2013-06-20 DIAGNOSIS — R1110 Vomiting, unspecified: Secondary | ICD-10-CM

## 2013-06-20 DIAGNOSIS — Z7982 Long term (current) use of aspirin: Secondary | ICD-10-CM | POA: Insufficient documentation

## 2013-06-20 DIAGNOSIS — Z8701 Personal history of pneumonia (recurrent): Secondary | ICD-10-CM | POA: Insufficient documentation

## 2013-06-20 DIAGNOSIS — Z8669 Personal history of other diseases of the nervous system and sense organs: Secondary | ICD-10-CM | POA: Insufficient documentation

## 2013-06-20 LAB — URINALYSIS, ROUTINE W REFLEX MICROSCOPIC
Bilirubin Urine: NEGATIVE
Glucose, UA: 500 mg/dL — AB
Ketones, ur: NEGATIVE mg/dL
Protein, ur: >300 mg/dL — AB
pH: 5 (ref 5.0–8.0)

## 2013-06-20 LAB — COMPREHENSIVE METABOLIC PANEL
AST: 12 U/L (ref 0–37)
Albumin: 3.2 g/dL — ABNORMAL LOW (ref 3.5–5.2)
Alkaline Phosphatase: 112 U/L (ref 39–117)
BUN: 24 mg/dL — ABNORMAL HIGH (ref 6–23)
CO2: 20 mEq/L (ref 19–32)
Chloride: 97 mEq/L (ref 96–112)
Creatinine, Ser: 1.15 mg/dL — ABNORMAL HIGH (ref 0.50–1.10)
GFR calc non Af Amer: 55 mL/min — ABNORMAL LOW (ref >90)
Potassium: 3.8 mEq/L (ref 3.5–5.1)
Total Bilirubin: 0.2 mg/dL — ABNORMAL LOW (ref 0.3–1.2)

## 2013-06-20 LAB — URINE MICROSCOPIC-ADD ON

## 2013-06-20 LAB — CBC WITH DIFFERENTIAL/PLATELET
Basophils Relative: 0 % (ref 0–1)
HCT: 36.6 % (ref 36.0–46.0)
Hemoglobin: 12.2 g/dL (ref 12.0–15.0)
Lymphocytes Relative: 29 % (ref 12–46)
Monocytes Absolute: 0.6 10*3/uL (ref 0.1–1.0)
Monocytes Relative: 6 % (ref 3–12)
Neutro Abs: 6 10*3/uL (ref 1.7–7.7)
Neutrophils Relative %: 64 % (ref 43–77)
RBC: 4.15 MIL/uL (ref 3.87–5.11)
WBC: 9.4 10*3/uL (ref 4.0–10.5)

## 2013-06-20 LAB — LIPASE, BLOOD: Lipase: 41 U/L (ref 11–59)

## 2013-06-20 MED ORDER — ONDANSETRON HCL 4 MG PO TABS: 4.0000 mg | ORAL_TABLET | Freq: Four times a day (QID) | ORAL | Status: DC

## 2013-06-20 MED ORDER — SODIUM CHLORIDE 0.9 % IV BOLUS (SEPSIS)
500.0000 mL | Freq: Once | INTRAVENOUS | Status: AC
  Administered 2013-06-20: 500 mL via INTRAVENOUS

## 2013-06-20 MED ORDER — ONDANSETRON HCL 4 MG/2ML IJ SOLN
4.0000 mg | Freq: Once | INTRAMUSCULAR | Status: AC
  Administered 2013-06-20: 4 mg via INTRAVENOUS
  Filled 2013-06-20: qty 2

## 2013-06-20 NOTE — ED Notes (Signed)
Pt sent from [facility] for further eval. Pt reports vomiting mucous for past several months, states she vomits so hard it makes her incontinent of stool and urine. Pt had scans and tests at her PCP with no diagnosis. States symptoms are worse today

## 2013-06-20 NOTE — ED Notes (Signed)
vomiting for several months; unable to get in to see regular MD today; sleeping w trash can because she vomits so often

## 2013-06-20 NOTE — ED Notes (Signed)
Diet tray ordered 

## 2013-06-20 NOTE — ED Provider Notes (Signed)
CSN: [id number]     Arrival date & time 06/20/13  1332 History   First MD Initiated Contact with Patient 06/20/13 1410     Chief Complaint  Patient presents with  . Emesis   (Consider location/radiation/quality/duration/timing/severity/associated sxs/prior Treatment) Patient is a 52 y.o. female presenting with cough. The history is provided by the patient.  Cough Cough characteristics:  Productive Sputum characteristics:  Clear Severity:  Severe Onset quality:  Gradual Duration: since jan 2014. Timing:  Constant Progression:  Unchanged Chronicity:  Chronic Smoker: no   Context comment:  Liquids trigger a tickle in throat which causes a cough assoc with copious clear mucous, no blood, no vomiting from stomach, sx 24/7.   Past Medical History  Diagnosis Date  . Hypertension   . High cholesterol   . Diabetes mellitus without complication   . Neuropathy   . Acid reflux   . Pneumonia    Past Surgical History  Procedure Laterality Date  . Tubal ligation     History reviewed. No pertinent family history. History  Substance Use Topics  . Smoking status: Former Games developer  . Smokeless tobacco: Not on file  . Alcohol Use: No   OB History   Grav Para Term Preterm Abortions TAB SAB Ect Mult Living                 Review of Systems  Constitutional: Negative.   Respiratory: Positive for cough.     Allergies  Lisinopril; Metformin and related; and Sulfa antibiotics  Home Medications   Current Outpatient Rx  Name  Route  Sig  Dispense  Refill  . amLODipine (NORVASC) 5 MG tablet   Oral   Take 5 mg by mouth daily.         Marland Kitchen aspirin 325 MG tablet   Oral   Take 325 mg by mouth daily.         . benzonatate (TESSALON) 200 MG capsule   Oral   Take 1 capsule (200 mg total) by mouth 3 (three) times daily as needed for cough.   20 capsule   0   . chlorpheniramine-HYDROcodone (TUSSIONEX PENNKINETIC ER) 10-8 MG/5ML LQCR   Oral   Take 5 mL by mouth every 12 (twelve)  hours as needed.   140 mL   0   . cholecalciferol (VITAMIN D) 1000 UNITS tablet   Oral   Take 1,000 Units by mouth 2 (two) times daily.         Marland Kitchen gabapentin (NEURONTIN) 300 MG capsule   Oral   Take 300 mg by mouth 2 (two) times daily.         Marland Kitchen glipiZIDE (GLUCOTROL) 10 MG tablet   Oral   Take 10 mg by mouth 2 (two) times daily before a meal.         . guaiFENesin (MUCINEX) 600 MG 12 hr tablet   Oral   Take 2 tablets (1,200 mg total) by mouth 2 (two) times daily.   30 tablet   0   . MAGNESIUM PO   Oral   Take 1 tablet by mouth daily.         . Melatonin 3 MG CAPS   Oral   Take 3 mg by mouth at bedtime.         . Multiple Vitamins-Calcium (ONE-A-DAY WOMENS PO)   Oral   Take 1 tablet by mouth daily.         . Multiple Vitamins-Minerals (MULTIVITAMIN WITH MINERALS) tablet  Oral   Take 1 tablet by mouth daily.         Marland Kitchen ofloxacin (OCUFLOX) 0.3 % ophthalmic solution   Left Eye   Place 1 drop into the left eye 4 (four) times daily.         Marland Kitchen omeprazole (PRILOSEC) 20 MG capsule   Oral   Take 20 mg by mouth daily.         Marland Kitchen PARoxetine (PAXIL) 20 MG tablet   Oral   Take 20 mg by mouth every morning.          BP 175/89  Pulse 127  Temp(Src) 98.6 F (37 C) (Oral)  Resp 16  SpO2 98% Physical Exam  Nursing note and vitals reviewed. Constitutional: She is oriented to person, place, and time. She appears well-developed and well-nourished. She appears distressed.  Spasmodic coughing.  HENT:  Head: Normocephalic.  Right Ear: External ear normal.  Left Ear: External ear normal.  Mouth/Throat: Oropharynx is clear and moist.  Eyes: Conjunctivae are normal. Pupils are equal, round, and reactive to light.  Neck: Normal range of motion. Neck supple. No thyromegaly present.  Cardiovascular: Normal heart sounds, intact distal pulses and normal pulses.  Tachycardia present.   Pulmonary/Chest: Effort normal and breath sounds normal.  Musculoskeletal: She  exhibits no edema.  Lymphadenopathy:    She has no cervical adenopathy.  Neurological: She is alert and oriented to person, place, and time.  Skin: Skin is warm and dry.    ED Course  Procedures (including critical care time) Labs Review Labs Reviewed - No data to display Imaging Review No results found.  EKG Interpretation     Ventricular Rate:    PR Interval:    QRS Duration:   QT Interval:    QTC Calculation:   R Axis:     Text Interpretation:              MDM  Sent for ent eval of throat irritation with assoc cough and copious mucous production, no blood since jan., constant daily sx.    Linna Hoff, MD 06/20/13 [phone]

## 2013-06-20 NOTE — ED Provider Notes (Signed)
CSN: [id number]     Arrival date & time 06/20/13  1458 History   First MD Initiated Contact with Patient 06/20/13 1524     Chief Complaint  Patient presents with  . Emesis   (Consider location/radiation/quality/duration/timing/severity/associated sxs/prior Treatment) HPI Comments: Patient presents to the ER for evaluation. She was seen previously at urgent care and referred to the ER for further evaluation. Patient reports that she has had nausea and vomiting for the past several months. This reports that the symptoms began with a tickle in her throat and that she starts having coughing spells. She then vomits and has had incontinence of urine and stool secondary to forceful coughing. Patient denies chest pain. She is not short of breath. She has not had any abdominal pain associated with the symptoms. There is no fever.  Patient is a 52 y.o. female presenting with vomiting.  Emesis Associated symptoms: diarrhea   Associated symptoms: no abdominal pain     Past Medical History  Diagnosis Date  . Hypertension   . High cholesterol   . Diabetes mellitus without complication   . Neuropathy   . Acid reflux   . Pneumonia    Past Surgical History  Procedure Laterality Date  . Tubal ligation     History reviewed. No pertinent family history. History  Substance Use Topics  . Smoking status: Former Games developer  . Smokeless tobacco: Not on file  . Alcohol Use: No   OB History   Grav Para Term Preterm Abortions TAB SAB Ect Mult Living                 Review of Systems  Constitutional: Negative for fever.  Respiratory: Positive for cough.   Cardiovascular: Negative for chest pain.  Gastrointestinal: Positive for vomiting and diarrhea. Negative for abdominal pain.  All other systems reviewed and are negative.    Allergies  Lisinopril; Metformin and related; and Sulfa antibiotics  Home Medications   Current Outpatient Rx  Name  Route  Sig  Dispense  Refill  . amLODipine  (NORVASC) 5 MG tablet   Oral   Take 5 mg by mouth daily.         Marland Kitchen aspirin 325 MG tablet   Oral   Take 325 mg by mouth daily.         . cholecalciferol (VITAMIN D) 1000 UNITS tablet   Oral   Take 1,000 Units by mouth 2 (two) times daily.         Marland Kitchen gabapentin (NEURONTIN) 300 MG capsule   Oral   Take 300 mg by mouth 2 (two) times daily.         Marland Kitchen glipiZIDE (GLUCOTROL) 10 MG tablet   Oral   Take 10 mg by mouth 2 (two) times daily before a meal.         . guaiFENesin (MUCINEX) 600 MG 12 hr tablet   Oral   Take 2 tablets (1,200 mg total) by mouth 2 (two) times daily.   30 tablet   0   . MAGNESIUM PO   Oral   Take 1 tablet by mouth daily.         . Melatonin 3 MG CAPS   Oral   Take 3 mg by mouth at bedtime.         . Multiple Vitamins-Calcium (ONE-A-DAY WOMENS PO)   Oral   Take 1 tablet by mouth daily.         . Multiple Vitamins-Minerals (MULTIVITAMIN WITH MINERALS) tablet  Oral   Take 1 tablet by mouth daily.         Marland Kitchen ofloxacin (OCUFLOX) 0.3 % ophthalmic solution   Left Eye   Place 1 drop into the left eye 4 (four) times daily.         Marland Kitchen omeprazole (PRILOSEC) 20 MG capsule   Oral   Take 20 mg by mouth daily.         Marland Kitchen PARoxetine (PAXIL) 20 MG tablet   Oral   Take 20 mg by mouth every morning.          BP 159/73  Pulse 98  Temp(Src) 97.8 F (36.6 C) (Oral)  Resp 24  Ht 5\' 4"  (1.626 m)  Wt 213 lb 8 oz (96.843 kg)  BMI 36.63 kg/m2  SpO2 98% Physical Exam  Constitutional: She is oriented to person, place, and time. She appears well-developed and well-nourished. No distress.  HENT:  Head: Normocephalic and atraumatic.  Right Ear: Hearing normal.  Left Ear: Hearing normal.  Nose: Nose normal.  Mouth/Throat: Oropharynx is clear and moist and mucous membranes are normal.  Eyes: Conjunctivae and EOM are normal. Pupils are equal, round, and reactive to light.  Neck: Normal range of motion. Neck supple.  Cardiovascular: Regular  rhythm, S1 normal and S2 normal.  Exam reveals no gallop and no friction rub.   No murmur heard. Pulmonary/Chest: Effort normal and breath sounds normal. No respiratory distress. She exhibits no tenderness.  Abdominal: Soft. Normal appearance and bowel sounds are normal. There is no hepatosplenomegaly. There is no tenderness. There is no rebound, no guarding, no tenderness at McBurney's point and negative Murphy's sign. No hernia.  Musculoskeletal: Normal range of motion.  Neurological: She is alert and oriented to person, place, and time. She has normal strength. No cranial nerve deficit or sensory deficit. Coordination normal. GCS eye subscore is 4. GCS verbal subscore is 5. GCS motor subscore is 6.  Skin: Skin is warm, dry and intact. No rash noted. No cyanosis.  Psychiatric: She has a normal mood and affect. Her speech is normal and behavior is normal. Thought content normal.    ED Course  Procedures (including critical care time) Labs Review Labs Reviewed  COMPREHENSIVE METABOLIC PANEL - Abnormal; Notable for the following:    Sodium 131 (*)    Glucose, Bld 330 (*)    BUN 24 (*)    Creatinine, Ser 1.15 (*)    Albumin 3.2 (*)    Total Bilirubin 0.2 (*)    GFR calc non Af Amer 55 (*)    GFR calc Af Amer 64 (*)    All other components within normal limits  CBC WITH DIFFERENTIAL  LIPASE, BLOOD  URINALYSIS, ROUTINE W REFLEX MICROSCOPIC  POCT PREGNANCY, URINE   Imaging Review No results found.  EKG Interpretation   None       MDM   1. Vomiting    Patient has had persistent symptoms of nausea, vomiting and cough for several weeks or longer. Patient has had nausea and vomiting which has been associated with her cough. Chest x-ray does not show any evidence of pneumonia. Remainder of the workup was unremarkable. Patient will be treated symptomatically.   Gilda Crease, MD 06/22/13 [phone]

## 2013-12-18 DIAGNOSIS — IMO0002 Reserved for concepts with insufficient information to code with codable children: Secondary | ICD-10-CM | POA: Insufficient documentation

## 2013-12-18 DIAGNOSIS — E119 Type 2 diabetes mellitus without complications: Secondary | ICD-10-CM | POA: Insufficient documentation

## 2014-02-24 DIAGNOSIS — E113599 Type 2 diabetes mellitus with proliferative diabetic retinopathy without macular edema, unspecified eye: Secondary | ICD-10-CM | POA: Insufficient documentation

## 2015-01-13 ENCOUNTER — Encounter (HOSPITAL_COMMUNITY): Payer: Self-pay | Admitting: *Deleted

## 2015-01-13 ENCOUNTER — Emergency Department (HOSPITAL_COMMUNITY): Payer: Self-pay

## 2015-01-13 ENCOUNTER — Encounter (HOSPITAL_COMMUNITY): Payer: Self-pay | Admitting: Emergency Medicine

## 2015-01-13 ENCOUNTER — Emergency Department (INDEPENDENT_AMBULATORY_CARE_PROVIDER_SITE_OTHER)
Admission: EM | Admit: 2015-01-13 | Discharge: 2015-01-13 | Payer: No Typology Code available for payment source | Source: Home / Self Care | Attending: Family Medicine | Admitting: Family Medicine

## 2015-01-13 ENCOUNTER — Inpatient Hospital Stay (HOSPITAL_COMMUNITY)
Admission: EM | Admit: 2015-01-13 | Discharge: 2015-01-15 | DRG: 074 | Disposition: A | Discharge status: Home / Self Care | Payer: Self-pay | Attending: Internal Medicine | Admitting: Internal Medicine

## 2015-01-13 DIAGNOSIS — R339 Retention of urine, unspecified: Secondary | ICD-10-CM | POA: Diagnosis present

## 2015-01-13 DIAGNOSIS — Z87891 Personal history of nicotine dependence: Secondary | ICD-10-CM

## 2015-01-13 DIAGNOSIS — E119 Type 2 diabetes mellitus without complications: Secondary | ICD-10-CM | POA: Insufficient documentation

## 2015-01-13 DIAGNOSIS — E1122 Type 2 diabetes mellitus with diabetic chronic kidney disease: Secondary | ICD-10-CM | POA: Diagnosis present

## 2015-01-13 DIAGNOSIS — E780 Pure hypercholesterolemia: Secondary | ICD-10-CM | POA: Diagnosis present

## 2015-01-13 DIAGNOSIS — M549 Dorsalgia, unspecified: Secondary | ICD-10-CM | POA: Diagnosis present

## 2015-01-13 DIAGNOSIS — Z888 Allergy status to other drugs, medicaments and biological substances status: Secondary | ICD-10-CM

## 2015-01-13 DIAGNOSIS — W19XXXA Unspecified fall, initial encounter: Secondary | ICD-10-CM

## 2015-01-13 DIAGNOSIS — R159 Full incontinence of feces: Secondary | ICD-10-CM | POA: Diagnosis present

## 2015-01-13 DIAGNOSIS — G834 Cauda equina syndrome: Secondary | ICD-10-CM

## 2015-01-13 DIAGNOSIS — R946 Abnormal results of thyroid function studies: Secondary | ICD-10-CM | POA: Diagnosis present

## 2015-01-13 DIAGNOSIS — I10 Essential (primary) hypertension: Secondary | ICD-10-CM | POA: Diagnosis present

## 2015-01-13 DIAGNOSIS — M4804 Spinal stenosis, thoracic region: Secondary | ICD-10-CM | POA: Diagnosis present

## 2015-01-13 DIAGNOSIS — W109XXA Fall (on) (from) unspecified stairs and steps, initial encounter: Secondary | ICD-10-CM | POA: Diagnosis present

## 2015-01-13 DIAGNOSIS — E1165 Type 2 diabetes mellitus with hyperglycemia: Secondary | ICD-10-CM | POA: Diagnosis present

## 2015-01-13 DIAGNOSIS — I129 Hypertensive chronic kidney disease with stage 1 through stage 4 chronic kidney disease, or unspecified chronic kidney disease: Secondary | ICD-10-CM | POA: Diagnosis present

## 2015-01-13 DIAGNOSIS — M4806 Spinal stenosis, lumbar region: Secondary | ICD-10-CM | POA: Diagnosis present

## 2015-01-13 DIAGNOSIS — E860 Dehydration: Secondary | ICD-10-CM | POA: Diagnosis present

## 2015-01-13 DIAGNOSIS — W010XXA Fall on same level from slipping, tripping and stumbling without subsequent striking against object, initial encounter: Secondary | ICD-10-CM | POA: Diagnosis present

## 2015-01-13 DIAGNOSIS — R200 Anesthesia of skin: Secondary | ICD-10-CM | POA: Diagnosis present

## 2015-01-13 DIAGNOSIS — G950 Syringomyelia and syringobulbia: Secondary | ICD-10-CM | POA: Diagnosis present

## 2015-01-13 DIAGNOSIS — E785 Hyperlipidemia, unspecified: Secondary | ICD-10-CM | POA: Diagnosis present

## 2015-01-13 DIAGNOSIS — M4802 Spinal stenosis, cervical region: Secondary | ICD-10-CM | POA: Diagnosis present

## 2015-01-13 DIAGNOSIS — Z9181 History of falling: Secondary | ICD-10-CM

## 2015-01-13 DIAGNOSIS — R202 Paresthesia of skin: Secondary | ICD-10-CM

## 2015-01-13 DIAGNOSIS — N183 Chronic kidney disease, stage 3 (moderate): Secondary | ICD-10-CM | POA: Diagnosis present

## 2015-01-13 DIAGNOSIS — M533 Sacrococcygeal disorders, not elsewhere classified: Secondary | ICD-10-CM | POA: Diagnosis present

## 2015-01-13 DIAGNOSIS — R29898 Other symptoms and signs involving the musculoskeletal system: Secondary | ICD-10-CM | POA: Diagnosis present

## 2015-01-13 DIAGNOSIS — M792 Neuralgia and neuritis, unspecified: Principal | ICD-10-CM | POA: Diagnosis present

## 2015-01-13 DIAGNOSIS — N179 Acute kidney failure, unspecified: Secondary | ICD-10-CM | POA: Diagnosis present

## 2015-01-13 DIAGNOSIS — Z7982 Long term (current) use of aspirin: Secondary | ICD-10-CM

## 2015-01-13 DIAGNOSIS — K219 Gastro-esophageal reflux disease without esophagitis: Secondary | ICD-10-CM | POA: Diagnosis present

## 2015-01-13 DIAGNOSIS — Z882 Allergy status to sulfonamides status: Secondary | ICD-10-CM

## 2015-01-13 LAB — I-STAT CHEM 8, ED
BUN: 34 mg/dL — AB (ref 6–20)
Calcium, Ion: 1.15 mmol/L (ref 1.12–1.23)
Chloride: 103 mmol/L (ref 101–111)
Creatinine, Ser: 1.6 mg/dL — ABNORMAL HIGH (ref 0.44–1.00)
GLUCOSE: 150 mg/dL — AB (ref 70–99)
HCT: 43 % (ref 36.0–46.0)
HEMOGLOBIN: 14.6 g/dL (ref 12.0–15.0)
Potassium: 3.5 mmol/L (ref 3.5–5.1)
Sodium: 139 mmol/L (ref 135–145)
TCO2: 22 mmol/L (ref 0–100)

## 2015-01-13 LAB — CBC WITH DIFFERENTIAL/PLATELET
BASOS ABS: 0 10*3/uL (ref 0.0–0.1)
Basophils Relative: 0 % (ref 0–1)
EOS ABS: 0.1 10*3/uL (ref 0.0–0.7)
EOS PCT: 1 % (ref 0–5)
HCT: 38.8 % (ref 36.0–46.0)
Hemoglobin: 12.6 g/dL (ref 12.0–15.0)
LYMPHS ABS: 2.5 10*3/uL (ref 0.7–4.0)
Lymphocytes Relative: 25 % (ref 12–46)
MCH: 28.3 pg (ref 26.0–34.0)
MCHC: 32.5 g/dL (ref 30.0–36.0)
MCV: 87 fL (ref 78.0–100.0)
Monocytes Absolute: 0.6 10*3/uL (ref 0.1–1.0)
Monocytes Relative: 6 % (ref 3–12)
Neutro Abs: 6.8 10*3/uL (ref 1.7–7.7)
Neutrophils Relative %: 68 % (ref 43–77)
PLATELETS: 233 10*3/uL (ref 150–400)
RBC: 4.46 MIL/uL (ref 3.87–5.11)
RDW: 13.7 % (ref 11.5–15.5)
WBC: 10 10*3/uL (ref 4.0–10.5)

## 2015-01-13 LAB — URINE MICROSCOPIC-ADD ON

## 2015-01-13 LAB — URINALYSIS, ROUTINE W REFLEX MICROSCOPIC
Bilirubin Urine: NEGATIVE
Glucose, UA: NEGATIVE mg/dL
Hgb urine dipstick: NEGATIVE
Ketones, ur: NEGATIVE mg/dL
Leukocytes, UA: NEGATIVE
Nitrite: NEGATIVE
Protein, ur: 100 mg/dL — AB
Specific Gravity, Urine: 1.016 (ref 1.005–1.030)
Urobilinogen, UA: 0.2 mg/dL (ref 0.0–1.0)
pH: 5 (ref 5.0–8.0)

## 2015-01-13 LAB — POC OCCULT BLOOD, ED: Fecal Occult Bld: NEGATIVE

## 2015-01-13 MED ORDER — MORPHINE SULFATE 4 MG/ML IJ SOLN
6.0000 mg | Freq: Once | INTRAMUSCULAR | Status: AC
  Administered 2015-01-13: 6 mg via INTRAVENOUS
  Filled 2015-01-13: qty 2

## 2015-01-13 MED ORDER — SODIUM CHLORIDE 0.9 % IV SOLN
INTRAVENOUS | Status: DC
  Administered 2015-01-13: 19:00:00 via INTRAVENOUS

## 2015-01-13 MED ORDER — HYDROMORPHONE HCL 1 MG/ML IJ SOLN
INTRAMUSCULAR | Status: AC
  Filled 2015-01-13: qty 1

## 2015-01-13 MED ORDER — HYDROMORPHONE HCL 1 MG/ML IJ SOLN
1.0000 mg | Freq: Once | INTRAMUSCULAR | Status: AC
  Administered 2015-01-13: 1 mg via INTRAMUSCULAR

## 2015-01-13 NOTE — ED Notes (Addendum)
Pt slipped down 5 stairs on bottom; has tailbone pain tingling in R arm now. Lower spine pain. Also states having watering diarr. Since the fall. Fell Thurs morning. Is able to walk but states standing up and sitting down is very hard.

## 2015-01-13 NOTE — ED Provider Notes (Signed)
CSN: [id number]     Arrival date & time 01/13/15  1038 History   First MD Initiated Contact with Patient 01/13/15 1232     Chief Complaint  Patient presents with  . Fall  . Tailbone Pain   (Consider location/radiation/quality/duration/timing/severity/associated sxs/prior Treatment) HPI     54 year old female presents complaining of a fall, now with pain in her back and tailbone, also paresthesias in her right arm. Fall was 6 days ago. Immediately she felt okay but the next day she began to feel very sore. She has bad pain in her tailbone and lower back and right arm paresthesias. This all started developing the next day. Also she admits to not having any control of her bowels in the past few days, she has started wearing diapers because of this. No loss of bladder control. No leg numbness or weakness. No history of back injuries or surgeries.  Past Medical History  Diagnosis Date  . Hypertension   . High cholesterol   . Diabetes mellitus without complication   . Neuropathy   . Acid reflux   . Pneumonia    Past Surgical History  Procedure Laterality Date  . Tubal ligation     No family history on file. History  Substance Use Topics  . Smoking status: Former Research scientist (life sciences)  . Smokeless tobacco: Not on file  . Alcohol Use: No   OB History    No data available     Review of Systems  Gastrointestinal:       Bowel incontinence  Musculoskeletal: Positive for back pain.       Pain in low back and coccyx  Neurological: Positive for numbness.  All other systems reviewed and are negative.   Allergies  Lisinopril; Metformin and related; and Sulfa antibiotics  Home Medications   Prior to Admission medications   Medication Sig Start Date End Date Taking? Authorizing Provider  amLODipine (NORVASC) 5 MG tablet Take 5 mg by mouth daily.   Yes Historical Provider, MD  aspirin 325 MG tablet Take 325 mg by mouth daily.   Yes Historical Provider, MD  cholecalciferol (VITAMIN D) 1000 UNITS  tablet Take 1,000 Units by mouth 2 (two) times daily.   Yes Historical Provider, MD  gabapentin (NEURONTIN) 300 MG capsule Take 300 mg by mouth 2 (two) times daily.   Yes Historical Provider, MD  glipiZIDE (GLUCOTROL) 10 MG tablet Take 10 mg by mouth 2 (two) times daily before a meal.   Yes Historical Provider, MD  omeprazole (PRILOSEC) 20 MG capsule Take 20 mg by mouth daily. 10/26/12  Yes Fredia Sorrow, MD  PARoxetine (PAXIL) 20 MG tablet Take 20 mg by mouth every morning.   Yes Historical Provider, MD  guaiFENesin (MUCINEX) 600 MG 12 hr tablet Take 2 tablets (1,200 mg total) by mouth 2 (two) times daily. 04/24/13   Linton Flemings, MD  MAGNESIUM PO Take 1 tablet by mouth daily.    Historical Provider, MD  Melatonin 3 MG CAPS Take 3 mg by mouth at bedtime.    Historical Provider, MD  Multiple Vitamins-Calcium (ONE-A-DAY WOMENS PO) Take 1 tablet by mouth daily.    Historical Provider, MD  Multiple Vitamins-Minerals (MULTIVITAMIN WITH MINERALS) tablet Take 1 tablet by mouth daily.    Historical Provider, MD  ofloxacin (OCUFLOX) 0.3 % ophthalmic solution Place 1 drop into the left eye 4 (four) times daily.    Historical Provider, MD  ondansetron (ZOFRAN) 4 MG tablet Take 1 tablet (4 mg total) by mouth every 6 (  six) hours. 06/20/13   Orpah Greek, MD   BP 142/85 mmHg  Pulse 110  Temp(Src) 97.6 F (36.4 C) (Oral)  Resp 18  SpO2 95%  LMP 12/15/2014 Physical Exam  Constitutional: She is oriented to person, place, and time. Vital signs are normal. She appears well-developed and well-nourished. She appears distressed (uncomfortable appearing ,laying on right side).  HENT:  Head: Normocephalic and atraumatic.  Pulmonary/Chest: Effort normal. No respiratory distress.  Neurological: She is alert and oriented to person, place, and time. She has normal strength. Coordination normal.  Skin: Skin is warm and dry. No rash noted. She is not diaphoretic.  Psychiatric: She has a normal mood and affect.  Judgment normal.  Nursing note and vitals reviewed.   ED Course  Procedures (including critical care time) Labs Review Labs Reviewed - No data to display  Imaging Review No results found.   MDM   1. Fall, initial encounter   2. Bowel incontinence   3. Arm paresthesia, right    Back pain with bowel incontinence, needs MRI to rule out cauda equina syndrome, transferred to ED. She is ambulatory and this was 6 days ago, we will transfer via [place], PA-C 01/13/15 1254

## 2015-01-13 NOTE — ED Notes (Signed)
C/o coccyx pain onset 1 week; fell down steps Also reports numbness on right arm Pain increases w/activity Alert, no signs of acute distress.

## 2015-01-13 NOTE — ED Provider Notes (Signed)
CSN: [id number]     Arrival date & time 01/13/15  1325 History   First MD Initiated Contact with Patient 01/13/15 1430     Chief Complaint  Patient presents with  . Fall    HPI    54 year old female presents today status post fall. Patient reports that 7 days ago she was walking down concrete steps and she fell landing backwards. She reports immediate pain to her lower lumbar sacral area with acute onset of loss of bowel control. She reports that since the fall she has been wearing diapers for the persistent diarrhea and bowel incontinence. She reports that she has no loss of distal strength, sensation, perfusion, or function. She reports full flexion and extension of hip knee ankles with minimal pain. Patient reports pain is worse when sitting down or laying on her back. Patient denies saddle anesthesia. Patient reports that she's had normal urinary habits frequencies until today reporting she has been unable to urinate even with increasing bladder discomfort. Pt notes that approx. 1 month ago she began two new medications, but was unable to recall their names during my evaluation. She denies fever, chills, HA, chest pain, SOB, abdominal pain or distention, exposure to abnormal food or drink, or close contact experiencing similar symptoms.    Past Medical History  Diagnosis Date  . Hypertension   . High cholesterol   . Diabetes mellitus without complication   . Neuropathy   . Acid reflux   . Pneumonia    Past Surgical History  Procedure Laterality Date  . Tubal ligation     No family history on file. History  Substance Use Topics  . Smoking status: Former Research scientist (life sciences)  . Smokeless tobacco: Not on file  . Alcohol Use: No   OB History    No data available     Review of Systems  All other systems reviewed and are negative.   Allergies  Lisinopril; Metformin and related; and Sulfa antibiotics  Home Medications   Prior to Admission medications   Medication Sig Start Date End Date  Taking? Authorizing Provider  amLODipine (NORVASC) 5 MG tablet Take 5 mg by mouth daily.    Historical Provider, MD  aspirin 325 MG tablet Take 325 mg by mouth daily.    Historical Provider, MD  cholecalciferol (VITAMIN D) 1000 UNITS tablet Take 1,000 Units by mouth 2 (two) times daily.    Historical Provider, MD  gabapentin (NEURONTIN) 300 MG capsule Take 300 mg by mouth 2 (two) times daily.    Historical Provider, MD  glipiZIDE (GLUCOTROL) 10 MG tablet Take 10 mg by mouth 2 (two) times daily before a meal.    Historical Provider, MD  guaiFENesin (MUCINEX) 600 MG 12 hr tablet Take 2 tablets (1,200 mg total) by mouth 2 (two) times daily. 04/24/13   Linton Flemings, MD  MAGNESIUM PO Take 1 tablet by mouth daily.    Historical Provider, MD  Melatonin 3 MG CAPS Take 3 mg by mouth at bedtime.    Historical Provider, MD  Multiple Vitamins-Calcium (ONE-A-DAY WOMENS PO) Take 1 tablet by mouth daily.    Historical Provider, MD  Multiple Vitamins-Minerals (MULTIVITAMIN WITH MINERALS) tablet Take 1 tablet by mouth daily.    Historical Provider, MD  ofloxacin (OCUFLOX) 0.3 % ophthalmic solution Place 1 drop into the left eye 4 (four) times daily.    Historical Provider, MD  omeprazole (PRILOSEC) 20 MG capsule Take 20 mg by mouth daily. 10/26/12   Fredia Sorrow, MD  ondansetron [facility])  4 MG tablet Take 1 tablet (4 mg total) by mouth every 6 (six) hours. 06/20/13   Orpah Greek, MD  PARoxetine (PAXIL) 20 MG tablet Take 20 mg by mouth every morning.    Historical Provider, MD   BP 157/86 mmHg  Pulse 72  Temp(Src) 99.4 F (37.4 C) (Oral)  Resp 16  SpO2 92%  LMP 12/15/2014   Physical Exam  Constitutional: She is oriented to person, place, and time. She appears well-developed and well-nourished.  HENT:  Head: Normocephalic and atraumatic.  Eyes: Conjunctivae are normal. Pupils are equal, round, and reactive to light. Right eye exhibits no discharge. Left eye exhibits no discharge. No scleral icterus.   Neck: Normal range of motion. No JVD present. No tracheal deviation present.  Cardiovascular: Normal rate, regular rhythm, normal heart sounds and intact distal pulses.   Pulmonary/Chest: Effort normal. No stridor.  Abdominal: Soft. She exhibits no distension and no mass. There is no tenderness. There is no rebound and no guarding.  Genitourinary: Rectal exam shows anal tone abnormal. Rectal exam shows no mass and no tenderness.  Musculoskeletal:  No C-spine, T-spine tenderness. Patient is tender to palpation lumbar T-spine and paraspinal muscles, with increasing tenderness down into the sacrum. Bruising left midline sacral area. Patient has full flexion and extension and hip knee and ankle, full strength 5/5. Sensation intact to her back hip groin and extremities. Patellar reflexes 2+ No pain with palpation of head neck or right shoulder, no signs of trauma. Sensation, perfusion, and strength intact to left upper extremity, equal bilateral.   Neurological: She is alert and oriented to person, place, and time. Coordination normal.  Psychiatric: She has a normal mood and affect. Her behavior is normal. Judgment and thought content normal.  Nursing note and vitals reviewed.   ED Course  Procedures (including critical care time) Labs Review Labs Reviewed  CBC WITH DIFFERENTIAL/PLATELET  POC OCCULT BLOOD, ED  I-STAT CHEM 8, ED    Imaging Review Dg Lumbar Spine Complete  01/13/2015   CLINICAL DATA:  54 year old female who fell downstairs this morning. Pain. Initial encounter.  EXAM: LUMBAR SPINE - COMPLETE 4+ VIEW  COMPARISON:  Abdominal radiographs 06/20/2013 and earlier.  FINDINGS: Bone mineralization is within normal limits. Normal lumbar segmentation. Normal lumbar vertebral height and alignment and preserved disc spaces. Suboptimal oblique views, no pars fracture identified. Sacral ala and SI joints appear stable and within normal limits. Visible lower thoracic levels appear intact.   IMPRESSION: No acute fracture or listhesis identified in the lumbar spine.   Electronically Signed   By: Genevie Ann M.D.   On: 01/13/2015 16:07   Dg Sacrum/coccyx  01/13/2015   CLINICAL DATA:  54 year old female who fell downstairs this morning. Pain. Initial encounter.  EXAM: SACRUM AND COCCYX - 2+ VIEW  COMPARISON:  Lumbar radiographs from today reported separately. Abdominal radiographs 06/20/2013 and earlier.  FINDINGS: Bone mineralization is within normal limits. Sacral ala and SI joints appear stable and intact. Visible pelvis appears intact. Chronic pelvic phleboliths appear stable. Sacral and coccygeal segments on the lateral view appear within normal limits.  IMPRESSION: No acute fracture or dislocation identified about the sacrum or coccyx.   Electronically Signed   By: Genevie Ann M.D.   On: 01/13/2015 16:08   Dg Hips Bilat With Pelvis 3-4 Views  01/13/2015   CLINICAL DATA:  Fall downstairs this morning with persistent back and leg pain, initial encounter  EXAM: BILATERAL HIP (WITH PELVIS) 3-4 VIEWS  COMPARISON:  None.  FINDINGS: Pelvic ring is intact. No fracture or dislocation is seen. No gross soft tissue abnormality is noted.  IMPRESSION: No acute abnormality noted.   Electronically Signed   By: Inez Catalina M.D.   On: 01/13/2015 16:07     EKG Interpretation None      MDM   Final diagnoses:  Fall  Bowel incontinence    Labs: CBC  Imaging: CT cervical spine, DG lumbar spine, DG hips bilateral- no significant findings  MRI lumbar- pending  Consults: Dr. Saintclair Halsted neurosurgery  Therapeutics: Dilaudid at urgent care  Assessment: Fall   Plan: Pt presents with pain from fall with subsequent bowel incontinence. Today she presents with the addition of urinary retention. Plain films of lumbar and hips show no acute findings. Bladder scan and foley with MRI study pending. Pt was signed out to Domenic Moras PA-C pending MRI. I spoke with Dr. Saintclair Halsted of neurology who asked to be contacted if concerns  for CE were found, if no MRI findings no need for neurosurgical intervention.  If MRI is negative for cauda equina, urology consult for urinary retention. Pt was also found to have elevated creatinine, no recent labs for comparison so unsure if this is due to urinary retention or another process.       Okey Regal, PA-C 01/14/15 [place], DO 01/17/15 1531

## 2015-01-13 NOTE — ED Notes (Signed)
Patient placed on 3L oxygen via n.c.

## 2015-01-13 NOTE — ED Notes (Signed)
Patient returned to room from MRI, PA in room to speak with patient.

## 2015-01-13 NOTE — ED Provider Notes (Signed)
Pt here with bowel incontinence x 6 days and now new urinary retention.  This all started when she had a mechanical fall 6 days ago.  Lspine MRI without evidence of cord compression.  No caudal equina.  Does report R arm tinglings.  5/5 strength to all 4 extremities.  Subjective paresthesia to R arm as compare to L arm.  Abdomen non tender but bladder scan with >600cc retained urine.  Will place foley.    9:32 PM UA shows no evidence of urinary tract infection. I have consult with on-call neurologist, Dr. Aram Beecham and discussed patient's symptoms including right arm paresthesia, urinary retention and bowel incontinence. He recommended thoracic MRI for further evaluation. Patient agrees with plan.  11:24 PM Thoracic MRI shows no acute fractures are malalignment. There is equivocal finding for cervical spinal cord edema/myelomalacia, this could be artifact though giving symptoms of right arm pain. Trapezius MRI of the cervical spine should be considered. 3 cm left thyroid nodule for which follow-up thyroid sonogram is recommended on a nonemergent basis.  Patient has been in the ED for 10 hours. I do not think an additional MRI tonight would be appropriate. I will consult neurology for recommendation. Care discussed with Dr. Tomi Bamberger.  11:41 PM Appreciate consultation from neurologist, Dr. Aram Beecham who acknowledged the finding of the thoracic MRI and recommend medicine admission and he will follow-up with patient to determine appropriate plan. Patient voiced understanding. Will consult for admission.  12:31 AM Appreciate consultation from hospitalist Dr. Roel Cluck who agrees to admit pt for further care.  Pt agrees with plan.    BP 132/63 mmHg  Pulse 92  Temp(Src) 98.3 F (36.8 C) (Oral)  Resp 16  SpO2 97%  LMP 12/15/2014  I have reviewed nursing notes and vital signs. I personally reviewed the imaging tests through PACS system  I reviewed available ER/hospitalization records thought the  EMR  Results for orders placed or performed during the hospital encounter of 01/13/15  CBC with Differential  Result Value Ref Range   WBC 10.0 4.0 - 10.5 K/uL   RBC 4.46 3.87 - 5.11 MIL/uL   Hemoglobin 12.6 12.0 - 15.0 g/dL   HCT 38.8 36.0 - 46.0 %   MCV 87.0 78.0 - 100.0 fL   MCH 28.3 26.0 - 34.0 pg   MCHC 32.5 30.0 - 36.0 g/dL   RDW 13.7 11.5 - 15.5 %   Platelets 233 150 - 400 K/uL   Neutrophils Relative % 68 43 - 77 %   Neutro Abs 6.8 1.7 - 7.7 K/uL   Lymphocytes Relative 25 12 - 46 %   Lymphs Abs 2.5 0.7 - 4.0 K/uL   Monocytes Relative 6 3 - 12 %   Monocytes Absolute 0.6 0.1 - 1.0 K/uL   Eosinophils Relative 1 0 - 5 %   Eosinophils Absolute 0.1 0.0 - 0.7 K/uL   Basophils Relative 0 0 - 1 %   Basophils Absolute 0.0 0.0 - 0.1 K/uL  Urinalysis, Routine w reflex microscopic  Result Value Ref Range   Color, Urine GREEN (A) YELLOW   APPearance CLEAR CLEAR   Specific Gravity, Urine 1.016 1.005 - 1.030   pH 5.0 5.0 - 8.0   Glucose, UA NEGATIVE NEGATIVE mg/dL   Hgb urine dipstick NEGATIVE NEGATIVE   Bilirubin Urine NEGATIVE NEGATIVE   Ketones, ur NEGATIVE NEGATIVE mg/dL   Protein, ur 100 (A) NEGATIVE mg/dL   Urobilinogen, UA 0.2 0.0 - 1.0 mg/dL   Nitrite NEGATIVE NEGATIVE   Leukocytes, UA  NEGATIVE NEGATIVE  Urine microscopic-add on  Result Value Ref Range   Squamous Epithelial / LPF RARE RARE   WBC, UA 0-2 <3 WBC/hpf   Bacteria, UA FEW (A) RARE   Urine-Other AMORPHOUS URATES/PHOSPHATES   POC occult blood, ED  Result Value Ref Range   Fecal Occult Bld NEGATIVE NEGATIVE  I-stat Chem 8, ED  Result Value Ref Range   Sodium 139 135 - 145 mmol/L   Potassium 3.5 3.5 - 5.1 mmol/L   Chloride 103 101 - 111 mmol/L   BUN 34 (H) 6 - 20 mg/dL   Creatinine, Ser 1.60 (H) 0.44 - 1.00 mg/dL   Glucose, Bld 150 (H) 70 - 99 mg/dL   Calcium, Ion 1.15 1.12 - 1.23 mmol/L   TCO2 22 0 - 100 mmol/L   Hemoglobin 14.6 12.0 - 15.0 g/dL   HCT 43.0 36.0 - 46.0 %   Dg Lumbar Spine  Complete  01/13/2015   CLINICAL DATA:  54 year old female who fell downstairs this morning. Pain. Initial encounter.  EXAM: LUMBAR SPINE - COMPLETE 4+ VIEW  COMPARISON:  Abdominal radiographs 06/20/2013 and earlier.  FINDINGS: Bone mineralization is within normal limits. Normal lumbar segmentation. Normal lumbar vertebral height and alignment and preserved disc spaces. Suboptimal oblique views, no pars fracture identified. Sacral ala and SI joints appear stable and within normal limits. Visible lower thoracic levels appear intact.  IMPRESSION: No acute fracture or listhesis identified in the lumbar spine.   Electronically Signed   By: Genevie Ann M.D.   On: 01/13/2015 16:07   Dg Sacrum/coccyx  01/13/2015   CLINICAL DATA:  54 year old female who fell downstairs this morning. Pain. Initial encounter.  EXAM: SACRUM AND COCCYX - 2+ VIEW  COMPARISON:  Lumbar radiographs from today reported separately. Abdominal radiographs 06/20/2013 and earlier.  FINDINGS: Bone mineralization is within normal limits. Sacral ala and SI joints appear stable and intact. Visible pelvis appears intact. Chronic pelvic phleboliths appear stable. Sacral and coccygeal segments on the lateral view appear within normal limits.  IMPRESSION: No acute fracture or dislocation identified about the sacrum or coccyx.   Electronically Signed   By: Genevie Ann M.D.   On: 01/13/2015 16:08   Ct Cervical Spine Wo Contrast  01/13/2015   CLINICAL DATA:  Fall down steps 6 days ago. Right arm numbness. Initial encounter.  EXAM: CT CERVICAL SPINE WITHOUT CONTRAST  TECHNIQUE: Multidetector CT imaging of the cervical spine was performed without intravenous contrast. Multiplanar CT image reconstructions were also generated.  COMPARISON:  None.  FINDINGS: There is straightening of the normal cervical lordosis. There is no listhesis. Vertebral body heights are preserved. Mild disc space narrowing is present at C2-3 and C6-7. Anterior osteophytosis is present at C5-6 and  C6-7. Degenerative changes are noted at the atlantodental articulation including narrowing of the atlantodental interval, sclerosis, and cystic/erosive changes. Mild pannus is noted about the dens. No cervical spine fracture is identified. There is at least mild left neural foraminal stenosis at C6-7. A 1.4 cm left thyroid lobe nodule is incidentally noted.  IMPRESSION: 1. No evidence of acute osseous abnormality in the cervical spine. 2. Mild lower cervical disc degeneration. Arthropathic changes about the dens.   Electronically Signed   By: Logan Bores   On: 01/13/2015 16:31   Mr Thoracic Spine Wo Contrast  01/13/2015   CLINICAL DATA:  Bowel incontinence (watery diarrhea) after fall, RIGHT arm paresthesias. Fell down steps 1 week ago.  EXAM: MRI THORACIC SPINE WITHOUT CONTRAST  TECHNIQUE: Multiplanar,  multisequence MR imaging of the thoracic spine was performed. No intravenous contrast was administered.  COMPARISON:  MRI of the lumbar spine Jan 13, 2015  FINDINGS: Thoracic vertebral bodies and posterior elements are intact and aligned with maintenance of thoracic kyphosis. Intervertebral discs demonstrate normal morphology and signal characteristics. Mild upper thoracic chronic discogenic endplate changes without STIR signal abnormality to suggest acute osseous process. Mild congenital canal narrowing on the basis of foreshortened pedicles.  Equivocal findings for cervical spinal cord T2 bright signal/ myelomalacia partially imaged at the extreme edge of the field of view. 2 mm dilated central spinal canal at T5. Conus medullaris terminates at L1 appears normal morphology and signal characteristics. 3 cm LEFT thyroid nodule partially imaged. Paraspinal soft tissues unremarkable.  At T4-5 is a 2 mm the central broad-based disc protrusion resulting in moderate canal stenosis and ventral cord deformity.  Small T5-6 through T7-8 broad-based disc bulges resulting in mild canal stenosis. No neural foraminal narrowing  at any thoracic level.  IMPRESSION: No acute fracture nor malalignment.  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  Equivocal findings for cervical spinal cord edema/myelomalacia, this could be artifact though, given symptoms of RIGHT arm paresthesias, MRI of the cervical spine should be considered.  3 cm LEFT thyroid nodule for which follow up thyroid sonogram is recommended on a nonemergent basis.   Electronically Signed   By: Elon Alas   On: 01/13/2015 23:06   Mr Lumbar Spine Wo Contrast  01/13/2015   CLINICAL DATA:  Cauda equina compression. Coccyx pain beginning 1 week ago after fall down the steps. Pain is worse with activity.  EXAM: MRI LUMBAR SPINE WITHOUT CONTRAST  TECHNIQUE: Multiplanar, multisequence MR imaging of the lumbar spine was performed. No intravenous contrast was administered.  COMPARISON:  Lumbar spine and sacrum radiographs from the same day.  FINDINGS: Normal signal is present in the conus medullaris which terminates at L1, within normal limits. Marrow signal, vertebral body heights, and alignment are normal.  The disc levels at L1-2 and above are normal.  L2-3: Moderate facet hypertrophy is present bilaterally. There is some crowding of the canal posteriorly. Normal disc signal is present without a focal protrusion.  L3-4: A mild broad-based disc protrusion is present. Moderate facet hypertrophy and short pedicles are noted. This results in mild foraminal narrowing on both sides.  L4-5: A broad-based disc protrusion is present. Moderate facet hypertrophy is present bilaterally. This results in mild subarticular and foraminal stenosis.  L5-S1: A leftward disc protrusion is present. Moderate left and mild right subarticular stenosis is present. Facet hypertrophy is worse on the left. Mild foraminal narrowing is worse on the left.  IMPRESSION: 1. Multilevel acquired and  congenital spinal stenosis as described. 2. Moderate facet hypertrophy at L2-3 without significant focal protrusion or stenosis. 3. Mild foraminal narrowing bilaterally at L3-4 secondary to facet hypertrophy and short pedicles. 4. Mild subarticular and foraminal narrowing bilaterally at L4-5. 5. Find moderate left and mild right subarticular stenosis at L5-S1. 6. Mild foraminal narrowing bilaterally at L5-S1 is worse on the left.   Electronically Signed   By: San Morelle M.D.   On: 01/13/2015 18:01   Dg Hips Bilat With Pelvis 3-4 Views  01/13/2015   CLINICAL DATA:  Fall downstairs this morning with persistent back and leg pain, initial encounter  EXAM: BILATERAL HIP (WITH PELVIS) 3-4 VIEWS  COMPARISON:  None.  FINDINGS: Pelvic  ring is intact. No fracture or dislocation is seen. No gross soft tissue abnormality is noted.  IMPRESSION: No acute abnormality noted.   Electronically Signed   By: Inez Catalina M.D.   On: 01/13/2015 16:07      Domenic Moras, PA-C 01/14/15 0031

## 2015-01-14 ENCOUNTER — Emergency Department (HOSPITAL_COMMUNITY): Payer: No Typology Code available for payment source

## 2015-01-14 ENCOUNTER — Encounter (HOSPITAL_COMMUNITY): Payer: Self-pay | Admitting: Internal Medicine

## 2015-01-14 DIAGNOSIS — R159 Full incontinence of feces: Secondary | ICD-10-CM | POA: Diagnosis present

## 2015-01-14 DIAGNOSIS — R200 Anesthesia of skin: Secondary | ICD-10-CM

## 2015-01-14 DIAGNOSIS — I10 Essential (primary) hypertension: Secondary | ICD-10-CM | POA: Diagnosis present

## 2015-01-14 DIAGNOSIS — R29898 Other symptoms and signs involving the musculoskeletal system: Secondary | ICD-10-CM | POA: Diagnosis present

## 2015-01-14 DIAGNOSIS — E119 Type 2 diabetes mellitus without complications: Secondary | ICD-10-CM

## 2015-01-14 DIAGNOSIS — R339 Retention of urine, unspecified: Secondary | ICD-10-CM

## 2015-01-14 DIAGNOSIS — R202 Paresthesia of skin: Secondary | ICD-10-CM

## 2015-01-14 DIAGNOSIS — N179 Acute kidney failure, unspecified: Secondary | ICD-10-CM | POA: Diagnosis present

## 2015-01-14 LAB — COMPREHENSIVE METABOLIC PANEL
ALT: 16 U/L (ref 14–54)
AST: 18 U/L (ref 15–41)
Albumin: 2.6 g/dL — ABNORMAL LOW (ref 3.5–5.0)
Alkaline Phosphatase: 113 U/L (ref 38–126)
Anion gap: 8 (ref 5–15)
BILIRUBIN TOTAL: 0.6 mg/dL (ref 0.3–1.2)
BUN: 23 mg/dL — ABNORMAL HIGH (ref 6–20)
CALCIUM: 8.5 mg/dL — AB (ref 8.9–10.3)
CHLORIDE: 105 mmol/L (ref 101–111)
CO2: 26 mmol/L (ref 22–32)
CREATININE: 1.28 mg/dL — AB (ref 0.44–1.00)
GFR calc non Af Amer: 48 mL/min — ABNORMAL LOW (ref >60)
GFR, EST AFRICAN AMERICAN: 55 mL/min — AB (ref >60)
GLUCOSE: 132 mg/dL — AB (ref 70–99)
Potassium: 3.6 mmol/L (ref 3.5–5.1)
SODIUM: 139 mmol/L (ref 135–145)
Total Protein: 7.1 g/dL (ref 6.5–8.1)

## 2015-01-14 LAB — CBC
HCT: 36.7 % (ref 36.0–46.0)
HEMOGLOBIN: 11.8 g/dL — AB (ref 12.0–15.0)
MCH: 28.4 pg (ref 26.0–34.0)
MCHC: 32.2 g/dL (ref 30.0–36.0)
MCV: 88.4 fL (ref 78.0–100.0)
Platelets: 215 10*3/uL (ref 150–400)
RBC: 4.15 MIL/uL (ref 3.87–5.11)
RDW: 13.8 % (ref 11.5–15.5)
WBC: 7.8 10*3/uL (ref 4.0–10.5)

## 2015-01-14 LAB — GLUCOSE, CAPILLARY
GLUCOSE-CAPILLARY: 105 mg/dL — AB (ref 70–99)
GLUCOSE-CAPILLARY: 123 mg/dL — AB (ref 70–99)
GLUCOSE-CAPILLARY: 147 mg/dL — AB (ref 70–99)
GLUCOSE-CAPILLARY: 163 mg/dL — AB (ref 70–99)
GLUCOSE-CAPILLARY: 157 mg/dL — AB (ref 70–99)
Glucose-Capillary: 184 mg/dL — ABNORMAL HIGH (ref 70–99)

## 2015-01-14 LAB — URINE CULTURE
COLONY COUNT: NO GROWTH
Culture: NO GROWTH

## 2015-01-14 LAB — T4, FREE: FREE T4: 0.85 ng/dL (ref 0.61–1.12)

## 2015-01-14 LAB — TSH: TSH: 0.307 u[IU]/mL — ABNORMAL LOW (ref 0.350–4.500)

## 2015-01-14 LAB — PHOSPHORUS: PHOSPHORUS: 4 mg/dL (ref 2.5–4.6)

## 2015-01-14 LAB — MAGNESIUM: Magnesium: 1.7 mg/dL (ref 1.7–2.4)

## 2015-01-14 MED ORDER — ACETAMINOPHEN 650 MG RE SUPP: 650.0000 mg | Freq: Four times a day (QID) | RECTAL | Status: DC | PRN

## 2015-01-14 MED ORDER — SODIUM CHLORIDE 0.9 % IV SOLN
INTRAVENOUS | Status: AC
  Administered 2015-01-14: 04:00:00 via INTRAVENOUS

## 2015-01-14 MED ORDER — ACETAMINOPHEN 325 MG PO TABS
650.0000 mg | ORAL_TABLET | Freq: Four times a day (QID) | ORAL | Status: DC | PRN
  Administered 2015-01-14 – 2015-01-15 (×2): 650 mg via ORAL
  Filled 2015-01-14 (×2): qty 2

## 2015-01-14 MED ORDER — AMLODIPINE BESYLATE 5 MG PO TABS
5.0000 mg | ORAL_TABLET | Freq: Every day | ORAL | Status: DC
  Administered 2015-01-14 – 2015-01-15 (×2): 5 mg via ORAL
  Filled 2015-01-14 (×2): qty 1

## 2015-01-14 MED ORDER — INSULIN ASPART 100 UNIT/ML ~~LOC~~ SOLN
0.0000 [IU] | SUBCUTANEOUS | Status: DC
  Administered 2015-01-14: 1 [IU] via SUBCUTANEOUS
  Administered 2015-01-14: 2 [IU] via SUBCUTANEOUS

## 2015-01-14 MED ORDER — INSULIN ASPART 100 UNIT/ML ~~LOC~~ SOLN
0.0000 [IU] | Freq: Three times a day (TID) | SUBCUTANEOUS | Status: DC
  Administered 2015-01-14: 2 [IU] via SUBCUTANEOUS
  Administered 2015-01-15: 1 [IU] via SUBCUTANEOUS
  Administered 2015-01-15: 5 [IU] via SUBCUTANEOUS

## 2015-01-14 MED ORDER — NYSTATIN 100000 UNIT/GM EX POWD
Freq: Three times a day (TID) | CUTANEOUS | Status: DC
  Administered 2015-01-15: 10:00:00 via TOPICAL
  Filled 2015-01-14: qty 15

## 2015-01-14 MED ORDER — PAROXETINE HCL 20 MG PO TABS
20.0000 mg | ORAL_TABLET | Freq: Every day | ORAL | Status: DC
  Administered 2015-01-14 – 2015-01-15 (×2): 20 mg via ORAL
  Filled 2015-01-14 (×2): qty 1

## 2015-01-14 MED ORDER — ONDANSETRON HCL 4 MG/2ML IJ SOLN: 4.0000 mg | Freq: Four times a day (QID) | INTRAMUSCULAR | Status: DC | PRN

## 2015-01-14 MED ORDER — PNEUMOCOCCAL VAC POLYVALENT 25 MCG/0.5ML IJ INJ
0.5000 mL | INJECTION | INTRAMUSCULAR | Status: AC
  Administered 2015-01-15: 0.5 mL via INTRAMUSCULAR
  Filled 2015-01-14: qty 0.5

## 2015-01-14 MED ORDER — GLIPIZIDE 10 MG PO TABS
10.0000 mg | ORAL_TABLET | Freq: Two times a day (BID) | ORAL | Status: DC
  Administered 2015-01-14 – 2015-01-15 (×2): 10 mg via ORAL
  Filled 2015-01-14 (×4): qty 1

## 2015-01-14 MED ORDER — SODIUM CHLORIDE 0.9 % IJ SOLN
3.0000 mL | Freq: Two times a day (BID) | INTRAMUSCULAR | Status: DC
  Administered 2015-01-14 – 2015-01-15 (×3): 3 mL via INTRAVENOUS

## 2015-01-14 MED ORDER — ENOXAPARIN SODIUM 40 MG/0.4ML ~~LOC~~ SOLN
40.0000 mg | SUBCUTANEOUS | Status: DC
  Administered 2015-01-14: 40 mg via SUBCUTANEOUS
  Filled 2015-01-14 (×2): qty 0.4

## 2015-01-14 MED ORDER — ASPIRIN 325 MG PO TABS
325.0000 mg | ORAL_TABLET | Freq: Every day | ORAL | Status: DC
  Administered 2015-01-14 – 2015-01-15 (×2): 325 mg via ORAL
  Filled 2015-01-14 (×2): qty 1

## 2015-01-14 MED ORDER — GABAPENTIN 300 MG PO CAPS
300.0000 mg | ORAL_CAPSULE | Freq: Two times a day (BID) | ORAL | Status: DC
  Administered 2015-01-14 – 2015-01-15 (×4): 300 mg via ORAL
  Filled 2015-01-14 (×5): qty 1

## 2015-01-14 MED ORDER — HYDROCODONE-ACETAMINOPHEN 5-325 MG PO TABS
1.0000 | ORAL_TABLET | ORAL | Status: DC | PRN
  Administered 2015-01-14: 1 via ORAL
  Administered 2015-01-14 (×2): 2 via ORAL
  Administered 2015-01-15 (×2): 1 via ORAL
  Filled 2015-01-14: qty 1
  Filled 2015-01-14: qty 2
  Filled 2015-01-14 (×2): qty 1
  Filled 2015-01-14: qty 2

## 2015-01-14 MED ORDER — FLUCONAZOLE 100 MG PO TABS
100.0000 mg | ORAL_TABLET | Freq: Once | ORAL | Status: AC
  Administered 2015-01-14: 100 mg via ORAL
  Filled 2015-01-14: qty 1

## 2015-01-14 MED ORDER — MORPHINE SULFATE 4 MG/ML IJ SOLN
4.0000 mg | INTRAMUSCULAR | Status: DC | PRN
  Administered 2015-01-14: 4 mg via INTRAVENOUS
  Filled 2015-01-14: qty 1

## 2015-01-14 MED ORDER — PANTOPRAZOLE SODIUM 40 MG PO TBEC
40.0000 mg | DELAYED_RELEASE_TABLET | Freq: Every day | ORAL | Status: DC
  Administered 2015-01-14 – 2015-01-15 (×2): 40 mg via ORAL
  Filled 2015-01-14 (×2): qty 1

## 2015-01-14 MED ORDER — OFLOXACIN 0.3 % OP SOLN
1.0000 [drp] | Freq: Three times a day (TID) | OPHTHALMIC | Status: DC
  Administered 2015-01-14 – 2015-01-15 (×6): 1 [drp] via OPHTHALMIC
  Filled 2015-01-14: qty 5

## 2015-01-14 MED ORDER — ONDANSETRON HCL 4 MG PO TABS: 4.0000 mg | ORAL_TABLET | Freq: Four times a day (QID) | ORAL | Status: DC | PRN

## 2015-01-14 MED ORDER — INSULIN ASPART 100 UNIT/ML ~~LOC~~ SOLN: 0.0000 [IU] | SUBCUTANEOUS | Status: DC

## 2015-01-14 NOTE — H&P (Signed)
PCP: Basilio Cairo    Referring provider Rona Ravens   Chief Complaint:  Numbness in right arm and coccyx pain   HPI: Tamara Henry is a 54 y.o. female   has a past medical history of Hypertension; High cholesterol; Diabetes mellitus without complication; Neuropathy; Acid reflux; and Pneumonia.   Presented with  1 week  Ago patient has slipped down 5 stairs on her buttocks after falling. Says since then she been having some right right numbness. Endorses watery diarrhea for the past 5 days. At first it was 10 times a day but has slowed down since. She have not had much PO.    In ER she was found to be retaining urine. She reports arm tingling in all 5 fingers going up the front of the arm. IN ER She was seen by Neurology who recommended MRI of thoracic and lumbar spine. Showing multiple chronic findings but not explaining her symptoms. 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 Hospitalist was called for admission for right arm tingling and urinary retention resulting in   acute renal failure  Review of Systems:    Pertinent positives include:   Tingling, diarrhea, back pain  Constitutional:  No weight loss, night sweats, Fevers, chills, fatigue, weight loss  HEENT:  No headaches, Difficulty swallowing,Tooth/dental problems,Sore throat,  No sneezing, itching, ear ache, nasal congestion, post nasal drip,  Cardio-vascular:  No chest pain, Orthopnea, PND, anasarca, dizziness, palpitations.no Bilateral lower extremity swelling  GI:  No heartburn, indigestion, abdominal pain, nausea, vomiting, change in bowel habits, loss of appetite, melena, blood in stool, hematemesis Resp:  no shortness of breath at rest. No dyspnea on exertion, No excess mucus, no productive cough, No non-productive cough, No coughing up of blood.No change in color of mucus.No wheezing. Skin:  no rash or lesions. No jaundice GU:  no dysuria, change in color of urine, no urgency or frequency. No straining to urinate.  No  flank pain.  Musculoskeletal:  No joint pain or no joint swelling. No decreased range of motion. No back pain.  Psych:  No change in mood or affect. No depression or anxiety. No memory loss.  Neuro: no localizing neurological complaints, no, no weakness, no double vision, no gait abnormality, no slurred speech, no confusion  Otherwise ROS are negative except for above, 10 systems were reviewed  Past Medical History: Past Medical History  Diagnosis Date  . Hypertension   . High cholesterol   . Diabetes mellitus without complication   . Neuropathy   . Acid reflux   . Pneumonia    Past Surgical History  Procedure Laterality Date  . Tubal ligation       Medications: Prior to Admission medications   Medication Sig Start Date End Date Taking? Authorizing Provider  amLODipine (NORVASC) 5 MG tablet Take 5 mg by mouth daily.   Yes Historical Provider, MD  aspirin 325 MG tablet Take 325 mg by mouth daily.   Yes Historical Provider, MD  Ezetimibe-Simvastatin (VYTORIN PO) Take 1 tablet by mouth daily.   Yes Historical Provider, MD  gabapentin (NEURONTIN) 300 MG capsule Take 300 mg by mouth 2 (two) times daily.   Yes Historical Provider, MD  glipiZIDE (GLUCOTROL) 10 MG tablet Take 10 mg by mouth 2 (two) times daily before a meal.   Yes Historical Provider, MD  MAGNESIUM PO Take 1 tablet by mouth daily.   Yes Historical Provider, MD  Melatonin 3 MG CAPS Take 3 mg by mouth at bedtime.  Yes Historical Provider, MD  Multiple Vitamins-Calcium (ONE-A-DAY WOMENS PO) Take 1 tablet by mouth daily.   Yes Historical Provider, MD  ofloxacin (OCUFLOX) 0.3 % ophthalmic solution Place 1 drop into the left eye 4 (four) times daily.   Yes Historical Provider, MD  omeprazole (PRILOSEC) 20 MG capsule Take 20 mg by mouth daily. 10/26/12  Yes Fredia Sorrow, MD  ondansetron (ZOFRAN) 4 MG tablet Take 1 tablet (4 mg total) by mouth every 6 (six) hours. 06/20/13  Yes Orpah Greek, MD  PARoxetine (PAXIL)  20 MG tablet Take 20 mg by mouth every morning.   Yes Historical Provider, MD  SitaGLIPtin Phosphate (JANUVIA PO) Take 1 tablet by mouth daily.   Yes Historical Provider, MD  guaiFENesin (MUCINEX) 600 MG 12 hr tablet Take 2 tablets (1,200 mg total) by mouth 2 (two) times daily. Patient not taking: Reported on 01/13/2015 04/24/13   Linton Flemings, MD    Allergies:   Allergies  Allergen Reactions  . Lisinopril Other (See Comments)    Inflammation, coughing  . Metformin And Related Diarrhea  . Sulfa Antibiotics Itching    Social History:  Ambulatory  independently  Lives at home   With family    reports that she has quit smoking. She does not have any smokeless tobacco history on file. She reports that she does not drink alcohol or use illicit drugs.    Family History: family history is not on file.    Physical Exam: Patient Vitals for the past 24 hrs:  BP Temp Temp src Pulse Resp SpO2  01/13/15 2321 - - - 92 - 97 %  01/13/15 2312 - - - 97 - 99 %  01/13/15 2303 - - - 98 - 99 %  01/13/15 2130 132/63 mmHg - - 99 - -  01/13/15 2015 138/67 mmHg - - 101 - 98 %  01/13/15 1945 129/83 mmHg - - 103 - 95 %  01/13/15 1930 141/73 mmHg - - 105 - 95 %  01/13/15 1915 129/68 mmHg - - 100 - 93 %  01/13/15 1900 151/82 mmHg - - 97 - 95 %  01/13/15 1626 155/86 mmHg 98.3 F (36.8 C) Oral 105 16 93 %  01/13/15 1530 144/75 mmHg - - 108 - 90 %  01/13/15 1500 157/86 mmHg - - 72 - 92 %  01/13/15 1445 154/84 mmHg - - 107 16 91 %  01/13/15 1331 152/89 mmHg 99.4 F (37.4 C) Oral 114 18 95 %    1. General:  in No Acute distress 2. Psychological: Alert and Oriented 3. Head/ENT:    Dry Mucous Membranes                          Head Non traumatic, neck supple                          Normal Dentition 4. SKIN: decreased Skin turgor,  Skin clean Dry and intact no rash 5. Heart: Regular rate and rhythm no Murmur, Rub or gallop 6. Lungs: Clear to auscultation bilaterally, no wheezes or crackles   7.  Abdomen: Soft, non-tender, Non distended 8. Lower extremities: no clubbing, cyanosis, or edema 9. Neurologicstrength 5 out of 5 in all 4 extremities cranial nerves II through XII intact sensation intact  10. MSK: Normal range of motion  body mass index is unknown because there is no weight on file.   Labs on Admission:  Results for orders placed or performed during the hospital encounter of 01/13/15 (from the past 24 hour(s))  CBC with Differential     Status: None   Collection Time: 01/13/15  4:43 PM  Result Value Ref Range   WBC 10.0 4.0 - 10.5 K/uL   RBC 4.46 3.87 - 5.11 MIL/uL   Hemoglobin 12.6 12.0 - 15.0 g/dL   HCT 38.8 36.0 - 46.0 %   MCV 87.0 78.0 - 100.0 fL   MCH 28.3 26.0 - 34.0 pg   MCHC 32.5 30.0 - 36.0 g/dL   RDW 13.7 11.5 - 15.5 %   Platelets 233 150 - 400 K/uL   Neutrophils Relative % 68 43 - 77 %   Neutro Abs 6.8 1.7 - 7.7 K/uL   Lymphocytes Relative 25 12 - 46 %   Lymphs Abs 2.5 0.7 - 4.0 K/uL   Monocytes Relative 6 3 - 12 %   Monocytes Absolute 0.6 0.1 - 1.0 K/uL   Eosinophils Relative 1 0 - 5 %   Eosinophils Absolute 0.1 0.0 - 0.7 K/uL   Basophils Relative 0 0 - 1 %   Basophils Absolute 0.0 0.0 - 0.1 K/uL  I-stat Chem 8, ED     Status: Abnormal   Collection Time: 01/13/15  5:13 PM  Result Value Ref Range   Sodium 139 135 - 145 mmol/L   Potassium 3.5 3.5 - 5.1 mmol/L   Chloride 103 101 - 111 mmol/L   BUN 34 (H) 6 - 20 mg/dL   Creatinine, Ser 1.60 (H) 0.44 - 1.00 mg/dL   Glucose, Bld 150 (H) 70 - 99 mg/dL   Calcium, Ion 1.15 1.12 - 1.23 mmol/L   TCO2 22 0 - 100 mmol/L   Hemoglobin 14.6 12.0 - 15.0 g/dL   HCT 43.0 36.0 - 46.0 %  POC occult blood, ED     Status: None   Collection Time: 01/13/15  6:57 PM  Result Value Ref Range   Fecal Occult Bld NEGATIVE NEGATIVE  Urinalysis, Routine w reflex microscopic     Status: Abnormal   Collection Time: 01/13/15  8:00 PM  Result Value Ref Range   Color, Urine GREEN (A) YELLOW   APPearance CLEAR CLEAR    Specific Gravity, Urine 1.016 1.005 - 1.030   pH 5.0 5.0 - 8.0   Glucose, UA NEGATIVE NEGATIVE mg/dL   Hgb urine dipstick NEGATIVE NEGATIVE   Bilirubin Urine NEGATIVE NEGATIVE   Ketones, ur NEGATIVE NEGATIVE mg/dL   Protein, ur 100 (A) NEGATIVE mg/dL   Urobilinogen, UA 0.2 0.0 - 1.0 mg/dL   Nitrite NEGATIVE NEGATIVE   Leukocytes, UA NEGATIVE NEGATIVE  Urine microscopic-add on     Status: Abnormal   Collection Time: 01/13/15  8:00 PM  Result Value Ref Range   Squamous Epithelial / LPF RARE RARE   WBC, UA 0-2 <3 WBC/hpf   Bacteria, UA FEW (A) RARE   Urine-Other AMORPHOUS URATES/PHOSPHATES     UA no evidence of UTI  No results found for: HGBA1C  CrCl cannot be calculated (Unknown ideal weight.).  BNP (last 3 results) No results for input(s): PROBNP in the last 8760 hours.  Other results:  I have pearsonaly reviewed this: ECG REPORT not obtained   There were no vitals filed for this visit.   Cultures:    Component Value Date/Time   SDES URINE, CLEAN CATCH 04/23/2013 1831   SPECREQUEST NONE 04/23/2013 1831   CULT NO GROWTH Performed at [facility] 04/23/2013 1831  REPTSTATUS 04/25/2013 FINAL 04/23/2013 1831     Radiological Exams on Admission: Dg Lumbar Spine Complete  01/13/2015   CLINICAL DATA:  54 year old female who fell downstairs this morning. Pain. Initial encounter.  EXAM: LUMBAR SPINE - COMPLETE 4+ VIEW  COMPARISON:  Abdominal radiographs 06/20/2013 and earlier.  FINDINGS: Bone mineralization is within normal limits. Normal lumbar segmentation. Normal lumbar vertebral height and alignment and preserved disc spaces. Suboptimal oblique views, no pars fracture identified. Sacral ala and SI joints appear stable and within normal limits. Visible lower thoracic levels appear intact.  IMPRESSION: No acute fracture or listhesis identified in the lumbar spine.   Electronically Signed   By: Genevie Ann M.D.   On: 01/13/2015 16:07   Dg Sacrum/coccyx  01/13/2015    CLINICAL DATA:  54 year old female who fell downstairs this morning. Pain. Initial encounter.  EXAM: SACRUM AND COCCYX - 2+ VIEW  COMPARISON:  Lumbar radiographs from today reported separately. Abdominal radiographs 06/20/2013 and earlier.  FINDINGS: Bone mineralization is within normal limits. Sacral ala and SI joints appear stable and intact. Visible pelvis appears intact. Chronic pelvic phleboliths appear stable. Sacral and coccygeal segments on the lateral view appear within normal limits.  IMPRESSION: No acute fracture or dislocation identified about the sacrum or coccyx.   Electronically Signed   By: Genevie Ann M.D.   On: 01/13/2015 16:08   Ct Cervical Spine Wo Contrast  01/13/2015   CLINICAL DATA:  Fall down steps 6 days ago. Right arm numbness. Initial encounter.  EXAM: CT CERVICAL SPINE WITHOUT CONTRAST  TECHNIQUE: Multidetector CT imaging of the cervical spine was performed without intravenous contrast. Multiplanar CT image reconstructions were also generated.  COMPARISON:  None.  FINDINGS: There is straightening of the normal cervical lordosis. There is no listhesis. Vertebral body heights are preserved. Mild disc space narrowing is present at C2-3 and C6-7. Anterior osteophytosis is present at C5-6 and C6-7. Degenerative changes are noted at the atlantodental articulation including narrowing of the atlantodental interval, sclerosis, and cystic/erosive changes. Mild pannus is noted about the dens. No cervical spine fracture is identified. There is at least mild left neural foraminal stenosis at C6-7. A 1.4 cm left thyroid lobe nodule is incidentally noted.  IMPRESSION: 1. No evidence of acute osseous abnormality in the cervical spine. 2. Mild lower cervical disc degeneration. Arthropathic changes about the dens.   Electronically Signed   By: Logan Bores   On: 01/13/2015 16:31   Mr Thoracic Spine Wo Contrast  01/13/2015   CLINICAL DATA:  Bowel incontinence (watery diarrhea) after fall, RIGHT arm  paresthesias. Fell down steps 1 week ago.  EXAM: MRI THORACIC SPINE WITHOUT CONTRAST  TECHNIQUE: Multiplanar, multisequence MR imaging of the thoracic spine was performed. No intravenous contrast was administered.  COMPARISON:  MRI of the lumbar spine Jan 13, 2015  FINDINGS: Thoracic vertebral bodies and posterior elements are intact and aligned with maintenance of thoracic kyphosis. Intervertebral discs demonstrate normal morphology and signal characteristics. Mild upper thoracic chronic discogenic endplate changes without STIR signal abnormality to suggest acute osseous process. Mild congenital canal narrowing on the basis of foreshortened pedicles.  Equivocal findings for cervical spinal cord T2 bright signal/ myelomalacia partially imaged at the extreme edge of the field of view. 2 mm dilated central spinal canal at T5. Conus medullaris terminates at L1 appears normal morphology and signal characteristics. 3 cm LEFT thyroid nodule partially imaged. Paraspinal soft tissues unremarkable.  At T4-5 is a 2 mm the central broad-based disc protrusion resulting  in moderate canal stenosis and ventral cord deformity.  Small T5-6 through T7-8 broad-based disc bulges resulting in mild canal stenosis. No neural foraminal narrowing at any thoracic level.  IMPRESSION: No acute fracture nor malalignment.  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  Equivocal findings for cervical spinal cord edema/myelomalacia, this could be artifact though, given symptoms of RIGHT arm paresthesias, MRI of the cervical spine should be considered.  3 cm LEFT thyroid nodule for which follow up thyroid sonogram is recommended on a nonemergent basis.   Electronically Signed   By: Elon Alas   On: 01/13/2015 23:06   Mr Lumbar Spine Wo Contrast  01/13/2015   CLINICAL DATA:  Cauda equina compression. Coccyx pain beginning 1 week ago  after fall down the steps. Pain is worse with activity.  EXAM: MRI LUMBAR SPINE WITHOUT CONTRAST  TECHNIQUE: Multiplanar, multisequence MR imaging of the lumbar spine was performed. No intravenous contrast was administered.  COMPARISON:  Lumbar spine and sacrum radiographs from the same day.  FINDINGS: Normal signal is present in the conus medullaris which terminates at L1, within normal limits. Marrow signal, vertebral body heights, and alignment are normal.  The disc levels at L1-2 and above are normal.  L2-3: Moderate facet hypertrophy is present bilaterally. There is some crowding of the canal posteriorly. Normal disc signal is present without a focal protrusion.  L3-4: A mild broad-based disc protrusion is present. Moderate facet hypertrophy and short pedicles are noted. This results in mild foraminal narrowing on both sides.  L4-5: A broad-based disc protrusion is present. Moderate facet hypertrophy is present bilaterally. This results in mild subarticular and foraminal stenosis.  L5-S1: A leftward disc protrusion is present. Moderate left and mild right subarticular stenosis is present. Facet hypertrophy is worse on the left. Mild foraminal narrowing is worse on the left.  IMPRESSION: 1. Multilevel acquired and congenital spinal stenosis as described. 2. Moderate facet hypertrophy at L2-3 without significant focal protrusion or stenosis. 3. Mild foraminal narrowing bilaterally at L3-4 secondary to facet hypertrophy and short pedicles. 4. Mild subarticular and foraminal narrowing bilaterally at L4-5. 5. Find moderate left and mild right subarticular stenosis at L5-S1. 6. Mild foraminal narrowing bilaterally at L5-S1 is worse on the left.   Electronically Signed   By: San Morelle M.D.   On: 01/13/2015 18:01   Dg Hips Bilat With Pelvis 3-4 Views  01/13/2015   CLINICAL DATA:  Fall downstairs this morning with persistent back and leg pain, initial encounter  EXAM: BILATERAL HIP (WITH PELVIS) 3-4 VIEWS   COMPARISON:  None.  FINDINGS: Pelvic ring is intact. No fracture or dislocation is seen. No gross soft tissue abnormality is noted.  IMPRESSION: No acute abnormality noted.   Electronically Signed   By: Inez Catalina M.D.   On: 01/13/2015 16:07    Chart has been reviewed  Family not at  Bedside    Assessment/Plan  54 year old female with history of diabetes and hypertension presents with a fall resulting severe back pain was found to have urinary retention. Symptoms was also associated with diarrhea severe enough to cause some stool incontinence. Patient also was reporting right hand tingling after extensive evaluation was found that she has C7 neuritis. neurology is aware on consulting. Diarrhea at this point improved patient appears to be dehydrated   Present on Admission:  . Right arm numbness and tingling - likely secondary to neuritis, management  as per neurology  . Urinary retention - possibly pain related. We'll treat back pain at this point continue 40 catheter. If persist will need urology consult  . Acute renal failure likely secondary to dehydration and urinary retention will give IV fluids continue with Foley  . Hypertension continue home medications diarrhea   diarrhea  - improving obtain stool cultures   dehydration will provide IV fluids in check orthostatics   significant back pain after fall. We'll have physical therapy evaluate prior to discharge  Prophylaxis: SCD   CODE STATUS:  FULL CODE   as per patient    Disposition:    To home once workup is complete and patient is stable  Other plan as per orders.  I have spent a total of 55 min on this admission  Elery Cadenhead 01/14/2015, 12:43 AM  [facility]  Pager [phone]   after 2 AM please page floor coverage PA If 7AM-7PM, please contact the day team taking care of the patient  [email or web address]  Password [email or web address]

## 2015-01-14 NOTE — Evaluation (Signed)
Physical Therapy Evaluation Patient Details Name: Tamara Henry MRN: [id number] DOB: 1961/05/25 Today's Date: 01/14/2015   History of Present Illness  Tamara Henry is an 54 y.o. female with a past medical history significant for DM, HTN, hypercholesterolemia, neuropathy, comes in for further evaluation of RUE weakness, bowel/bladder dysfunction, and back pain. She reports symptoms started after falling several days ago.  Clinical Impression  Pt admitted with above diagnosis. Pt currently with functional limitations due to the deficits listed below (see PT Problem List). On eval, pt was supervision for safety only with transfers. Min guard assist for gait without A.D. 225 feet, with pain limiting gait distance. Pt will benefit from skilled PT to increase their independence and safety with mobility to allow discharge to the venue listed below.       Follow Up Recommendations Home health PT;Supervision - Intermittent (pt has no transportation to OPPT)    Equipment Recommendations  None recommended by PT    Recommendations for Other Services       Precautions / Restrictions Precautions Precautions: Fall Restrictions Weight Bearing Restrictions: No      Mobility  Bed Mobility Overal bed mobility: Modified Independent                Transfers Overall transfer level: Needs assistance Equipment used: None Transfers: Sit to/from [employer] Transfers Sit to Stand: Supervision Stand pivot transfers: Supervision       General transfer comment: supervision for safety  Ambulation/Gait Ambulation/Gait assistance: Min guard Ambulation Distance (Feet): 225 Feet Assistive device: None Gait Pattern/deviations: Step-through pattern;Antalgic Gait velocity: mildly decreased   General Gait Details: Pt began experiencing increased pain in L buttocks with ambulation radiating posteriorly to her knee. Suspect possible sciatica. Pt rated this pain as 10/10, requiring return to  room.  Stairs            Wheelchair Mobility    Modified Rankin (Stroke Patients Only)       Balance Overall balance assessment: No apparent balance deficits (not formally assessed)                                           Pertinent Vitals/Pain Pain Assessment: 0-10 Pain Score: 8  Pain Location: low back/left buttock Pain Intervention(s): Monitored during session;Premedicated before session;Repositioned    Home Living Family/patient expects to be discharged to:: Private residence Living Arrangements: Children;Other relatives (lives with daughter and 6 grandchildren) Available Help at Discharge: Family;Available PRN/intermittently Type of Home: Mobile home Home Access: Stairs to enter Entrance Stairs-Rails: Right Entrance Stairs-Number of Steps: 5 Home Layout: One level Home Equipment: None      Prior Function Level of Independence: Independent               Hand Dominance        Extremity/Trunk Assessment   Upper Extremity Assessment: Overall WFL for tasks assessed;RUE deficits/detail RUE Deficits / Details: pt reports occassional tingling but numbness has subsided         Lower Extremity Assessment: Overall WFL for tasks assessed      Cervical / Trunk Assessment: Normal  Communication   Communication: No difficulties  Cognition Arousal/Alertness: Awake/alert Behavior During Therapy: WFL for tasks assessed/performed Overall Cognitive Status: Within Functional Limits for tasks assessed                      General Comments  Exercises        Assessment/Plan    PT Assessment Patient needs continued PT services  PT Diagnosis Difficulty walking;Acute pain   PT Problem List Decreased activity tolerance;Decreased mobility;Pain  PT Treatment Interventions Gait training;Stair training;Functional mobility training;Therapeutic activities;Therapeutic exercise;Patient/family education;Balance training   PT Goals  (Current goals can be found in the Care Plan section) Acute Rehab PT Goals Patient Stated Goal: decrease pain PT Goal Formulation: With patient Time For Goal Achievement: 01/28/15 Potential to Achieve Goals: Good    Frequency Min 3X/week   Barriers to discharge        Co-evaluation               End of Session Equipment Utilized During Treatment: Gait belt Activity Tolerance: Patient tolerated treatment well Patient left: in bed;with call bell/phone within reach Nurse Communication: Mobility status         Time: 8413-2440 PT Time Calculation (min) (ACUTE ONLY): 16 min   Charges:   PT Evaluation $Initial PT Evaluation Tier I: 1 Procedure     PT G CodesLorriane Shire 01/14/2015, 11:53 AM

## 2015-01-14 NOTE — Care Management Note (Signed)
Case Management Note  Patient Details  Name: Zani Kyllonen MRN: [id number] Date of Birth: Nov 05, 1960  Subjective/Objective:               NCM scheduled patient an f/u apt with [facility] for 01/20/15 at 10 am.  NCM will cont to follow for dc f/u.  Patient will be able to go there to get her medications at dc if she is having problems affording her meds.  The [facility] clinic closes at 6 pm and they are not open on the weekends.      Action/Plan:   Expected Discharge Date:  01/16/15               Expected Discharge Plan:  Home/Self Care  In-House Referral:     Discharge planning Services  CM Consult, [facility], Follow-up appt scheduled  Post Acute Care Choice:    Choice offered to:     DME Arranged:    DME Agency:     HH Arranged:    HH Agency:     Status of Service:  In process, will continue to follow  Medicare Important Message Given:  No Date Medicare IM Given:    Medicare IM give by:    Date Additional Medicare IM Given:    Additional Medicare Important Message give by:     If discussed at [place] of Stay Meetings, dates discussed:    Additional Comments:  Zenon Mayo, RN 01/14/2015, 1:46 PM

## 2015-01-14 NOTE — Progress Notes (Addendum)
[facility] Progress Note   Tamara Henry OJJ:[id number] DOB: [birth year] DOA: 01/13/2015 PCP: Default, Provider, MD  Brief narrative: Tamara Henry is a 54 y.o. female with past medical history of hypertension, hyperlipidemia, diabetes mellitus who fell down a flight of stairs about 5 days ago and has since had severe sacral pain. In addition she developed watery diarrhea with incontinence of stool. She did not have any abdominal pain or nausea. She continued to have sacral pain and then developed difficulty with urinating and therefore presented to the ER. In addition she had numbness and tingling in her right arm. MRI of the neck performed in the ER revealed a C7 neuritis. A Foley catheter had to be placed due to urinary retention.   Subjective: Currently no complaints of pain. States that current IV and oral pain medicines are keeping her pain under control. She continues to have some numbness and tingling in her right hand and forearm. She has no weakness in this arm or elsewhere. Her diarrhea has resolved completely and she has not had a bowel movement since being admitted to the hospital  Assessment/Plan: Principal Problem:   Urinary retention/fecal incontinence - have reviewed MRI reports and there is no structural abnormality that could be causing urinary retention. Fecal incontinence has resolved. Suspect retention of urine may have been secondary to severe sacral pain. We'll ask for Foley to be removed.  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- awaiting input from neurology on how to treat this- continue Neurontin    Acute renal failure -Possibly in relation to urinary retention in addition to dehydration from perfuse diarrhea -She has been hydrated and creatinine is improving-by mouth intake is adequate therefore, I will stop IV fluids today and follow creatinine    DM II (diabetes mellitus, type II), controlled -Takes glipizide  and Januvia at home which were not resumed-will resume glipizide today-continue sliding scale insulin  Abnormal TSH - free T4 normal  Code Status: Full code Family Communication:  Disposition Plan: Home when stable DVT prophylaxis: Lovenox Consultants: Neurology Procedures:  Antibiotics: Anti-infectives    None      Objective: Filed Weights   01/14/15 0259  Weight: 105.96 kg (233 lb 9.6 oz)    Intake/Output Summary (Last 24 hours) at 01/14/15 1431 Last data filed at 01/14/15 0928  Gross per 24 hour  Intake 676.67 ml  Output   1150 ml  Net -473.33 ml     Vitals Filed Vitals:   01/13/15 2321 01/14/15 0259 01/14/15 0518 01/14/15 0922  BP:  143/81 142/79 138/68  Pulse: 92 92 93 91  Temp:  98.4 F (36.9 C) 98.3 F (36.8 C) 98.2 F (36.8 C)  TempSrc:  Oral Oral Oral  Resp:  18 18   Height:  5\' 4"  (1.626 m)    Weight:  105.96 kg (233 lb 9.6 oz)    SpO2: 97% 97% 94% 97%    Exam:  General:  Pt is alert, not in acute distress  HEENT: No icterus, No thrush  Cardiovascular: regular rate and rhythm, S1/S2 No murmur  Respiratory: clear to auscultation bilaterally   Abdomen: Soft, +Bowel sounds, non tender, non distended, no guarding  MSK: No LE edema, cyanosis or clubbing  Neuro: Cranial nerves II through XII intact, strength 5 over 5 in all 4 extremities, subjective numbness in right hand and arm  Data Reviewed: Basic Metabolic Panel:  Recent Labs Lab 01/13/15 1713 01/14/15 0617  NA  139 139  K 3.5 3.6  CL 103 105  CO2  --  26  GLUCOSE 150* 132*  BUN 34* 23*  CREATININE 1.60* 1.28*  CALCIUM  --  8.5*  MG  --  1.7  PHOS  --  4.0   Liver Function Tests:  Recent Labs Lab 01/14/15 0617  AST 18  ALT 16  ALKPHOS 113  BILITOT 0.6  PROT 7.1  ALBUMIN 2.6*   No results for input(s): LIPASE, AMYLASE in the last 168 hours. No results for input(s): AMMONIA in the last 168 hours. CBC:  Recent Labs Lab 01/13/15 1643 01/13/15 1713 01/14/15 0617   WBC 10.0  --  7.8  NEUTROABS 6.8  --   --   HGB 12.6 14.6 11.8*  HCT 38.8 43.0 36.7  MCV 87.0  --  88.4  PLT 233  --  215   Cardiac Enzymes: No results for input(s): CKTOTAL, CKMB, CKMBINDEX, TROPONINI in the last 168 hours. BNP (last 3 results) No results for input(s): BNP in the last 8760 hours.  ProBNP (last 3 results) No results for input(s): PROBNP in the last 8760 hours.  CBG:  Recent Labs Lab 01/14/15 0320 01/14/15 0802 01/14/15 1219  GLUCAP 105* 123* 184*    No results found for this or any previous visit (from the past 240 hour(s)).   Studies: Dg Lumbar Spine Complete  01/13/2015   CLINICAL DATA:  54 year old female who fell downstairs this morning. Pain. Initial encounter.  EXAM: LUMBAR SPINE - COMPLETE 4+ VIEW  COMPARISON:  Abdominal radiographs 06/20/2013 and earlier.  FINDINGS: Bone mineralization is within normal limits. Normal lumbar segmentation. Normal lumbar vertebral height and alignment and preserved disc spaces. Suboptimal oblique views, no pars fracture identified. Sacral ala and SI joints appear stable and within normal limits. Visible lower thoracic levels appear intact.  IMPRESSION: No acute fracture or listhesis identified in the lumbar spine.   Electronically Signed   By: Genevie Ann M.D.   On: 01/13/2015 16:07   Dg Sacrum/coccyx  01/13/2015   CLINICAL DATA:  54 year old female who fell downstairs this morning. Pain. Initial encounter.  EXAM: SACRUM AND COCCYX - 2+ VIEW  COMPARISON:  Lumbar radiographs from today reported separately. Abdominal radiographs 06/20/2013 and earlier.  FINDINGS: Bone mineralization is within normal limits. Sacral ala and SI joints appear stable and intact. Visible pelvis appears intact. Chronic pelvic phleboliths appear stable. Sacral and coccygeal segments on the lateral view appear within normal limits.  IMPRESSION: No acute fracture or dislocation identified about the sacrum or coccyx.   Electronically Signed   By: Genevie Ann M.D.    On: 01/13/2015 16:08   Ct Cervical Spine Wo Contrast  01/13/2015   CLINICAL DATA:  Fall down steps 6 days ago. Right arm numbness. Initial encounter.  EXAM: CT CERVICAL SPINE WITHOUT CONTRAST  TECHNIQUE: Multidetector CT imaging of the cervical spine was performed without intravenous contrast. Multiplanar CT image reconstructions were also generated.  COMPARISON:  None.  FINDINGS: There is straightening of the normal cervical lordosis. There is no listhesis. Vertebral body heights are preserved. Mild disc space narrowing is present at C2-3 and C6-7. Anterior osteophytosis is present at C5-6 and C6-7. Degenerative changes are noted at the atlantodental articulation including narrowing of the atlantodental interval, sclerosis, and cystic/erosive changes. Mild pannus is noted about the dens. No cervical spine fracture is identified. There is at least mild left neural foraminal stenosis at C6-7. A 1.4 cm left thyroid lobe nodule is incidentally noted.  IMPRESSION: 1. No evidence of acute osseous abnormality in the cervical spine. 2. Mild lower cervical disc degeneration. Arthropathic changes about the dens.   Electronically Signed   By: Logan Bores   On: 01/13/2015 16:31   Mr Cervical Spine Wo Contrast  01/14/2015   CLINICAL DATA:  Slip and fall down 5 stairs 1 week ago. Bowel incontinence (watery diarrhea) after fall, RIGHT arm paresthesias.  EXAM: MRI CERVICAL SPINE WITHOUT CONTRAST  TECHNIQUE: Multiplanar, multisequence MR imaging of the cervical spine was performed. No intravenous contrast was administered.  COMPARISON:  CT of the cervical spine Jan 13, 2015 and MRI of the thoracic spine Jan 13, 2015  FINDINGS: Cervical vertebral bodies and posterior elements intact and aligned, straightened cervical lordosis patent. Mild C6-7 disc height loss, with decreased T2 signal within all disc consistent mild desiccation. Mild to moderate chronic discogenic endplate changes at B7-1, minimal at C4-5 and C5-6 without STIR  signal abnormality to suggest acute osseous process. Mild congenital canal narrowing on the basis of foreshortened pedicles.  Cervical spinal cord appears normal morphology and signal characteristics from the cervical medullary junction to level of T2-3, the most caudal well visualized level. Low signal about the atlantodental interval consistent with osteoarthrosis which was present on prior CT. 3 cm LEFT thyroid nodule incompletely imaged.  Level by level evaluation (moderately motion degraded axial sequences limit evaluation):  C2-3: Small broad-based disc bulge, uncovertebral hypertrophy and minimal facet arthropathy without canal stenosis. Mild RIGHT neural foraminal narrowing.  C3-4: Uncovertebral hypertrophy, 2 mm central disc protrusion deforming the ventral spinal cord, uncovertebral hypertrophy and minimal facet arthropathy. Mild canal stenosis without neural foraminal narrowing.  C4-5: Small broad-based disc bulge and central disc protrusion measure up to 2-3 mm, contacting the ventral spinal cord. Uncovertebral hypertrophy and minimal facet arthropathy. Mild canal stenosis. Mild RIGHT neural foraminal narrowing.  C5-6: Small 2 mm broad-based disc protrusion asymmetric to the RIGHT. Uncovertebral hypertrophy and minimal facet arthropathy. No canal stenosis. Mild to moderate RIGHT neural foraminal narrowing.  C6-7: Moderate broad-based disc bulge asymmetric to the RIGHT, uncovertebral hypertrophy. Mild canal stenosis. Moderate RIGHT neural foraminal narrowing. Somewhat bright, enlarged exiting RIGHT C7 nerve.  C7-T1: No disc bulge, canal stenosis nor neural foraminal narrowing.  IMPRESSION: Straightened cervical lordosis without acute fracture nor malalignment.  Motion degraded examination. Multilevel disc bulges/protrusions, resulting in moderate RIGHT C6-7 neural foraminal narrowing associated with enlarged, edematous exiting RIGHT C7 nerve concerning for neuritis.  Mild canal stenosis C3-4, C4-5 and  C6-7.  3 cm LEFT thyroid nodule for which follow up thyroid sonogram is recommended on a nonemergent basis.   Electronically Signed   By: Elon Alas   On: 01/14/2015 02:30   Mr Thoracic Spine Wo Contrast  01/13/2015   CLINICAL DATA:  Bowel incontinence (watery diarrhea) after fall, RIGHT arm paresthesias. Fell down steps 1 week ago.  EXAM: MRI THORACIC SPINE WITHOUT CONTRAST  TECHNIQUE: Multiplanar, multisequence MR imaging of the thoracic spine was performed. No intravenous contrast was administered.  COMPARISON:  MRI of the lumbar spine Jan 13, 2015  FINDINGS: Thoracic vertebral bodies and posterior elements are intact and aligned with maintenance of thoracic kyphosis. Intervertebral discs demonstrate normal morphology and signal characteristics. Mild upper thoracic chronic discogenic endplate changes without STIR signal abnormality to suggest acute osseous process. Mild congenital canal narrowing on the basis of foreshortened pedicles.  Equivocal findings for cervical spinal cord T2 bright signal/ myelomalacia partially imaged at the extreme edge of the field of view. 2  mm dilated central spinal canal at T5. Conus medullaris terminates at L1 appears normal morphology and signal characteristics. 3 cm LEFT thyroid nodule partially imaged. Paraspinal soft tissues unremarkable.  At T4-5 is a 2 mm the central broad-based disc protrusion resulting in moderate canal stenosis and ventral cord deformity.  Small T5-6 through T7-8 broad-based disc bulges resulting in mild canal stenosis. No neural foraminal narrowing at any thoracic level.  IMPRESSION: No acute fracture nor malalignment.  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  Equivocal findings for cervical spinal cord edema/myelomalacia, this could be artifact though, given symptoms of RIGHT arm paresthesias, MRI of the cervical spine should be  considered.  3 cm LEFT thyroid nodule for which follow up thyroid sonogram is recommended on a nonemergent basis.   Electronically Signed   By: Elon Alas   On: 01/13/2015 23:06   Mr Lumbar Spine Wo Contrast  01/13/2015   CLINICAL DATA:  Cauda equina compression. Coccyx pain beginning 1 week ago after fall down the steps. Pain is worse with activity.  EXAM: MRI LUMBAR SPINE WITHOUT CONTRAST  TECHNIQUE: Multiplanar, multisequence MR imaging of the lumbar spine was performed. No intravenous contrast was administered.  COMPARISON:  Lumbar spine and sacrum radiographs from the same day.  FINDINGS: Normal signal is present in the conus medullaris which terminates at L1, within normal limits. Marrow signal, vertebral body heights, and alignment are normal.  The disc levels at L1-2 and above are normal.  L2-3: Moderate facet hypertrophy is present bilaterally. There is some crowding of the canal posteriorly. Normal disc signal is present without a focal protrusion.  L3-4: A mild broad-based disc protrusion is present. Moderate facet hypertrophy and short pedicles are noted. This results in mild foraminal narrowing on both sides.  L4-5: A broad-based disc protrusion is present. Moderate facet hypertrophy is present bilaterally. This results in mild subarticular and foraminal stenosis.  L5-S1: A leftward disc protrusion is present. Moderate left and mild right subarticular stenosis is present. Facet hypertrophy is worse on the left. Mild foraminal narrowing is worse on the left.  IMPRESSION: 1. Multilevel acquired and congenital spinal stenosis as described. 2. Moderate facet hypertrophy at L2-3 without significant focal protrusion or stenosis. 3. Mild foraminal narrowing bilaterally at L3-4 secondary to facet hypertrophy and short pedicles. 4. Mild subarticular and foraminal narrowing bilaterally at L4-5. 5. Find moderate left and mild right subarticular stenosis at L5-S1. 6. Mild foraminal narrowing bilaterally at  L5-S1 is worse on the left.   Electronically Signed   By: San Morelle M.D.   On: 01/13/2015 18:01   Dg Hips Bilat With Pelvis 3-4 Views  01/13/2015   CLINICAL DATA:  Fall downstairs this morning with persistent back and leg pain, initial encounter  EXAM: BILATERAL HIP (WITH PELVIS) 3-4 VIEWS  COMPARISON:  None.  FINDINGS: Pelvic ring is intact. No fracture or dislocation is seen. No gross soft tissue abnormality is noted.  IMPRESSION: No acute abnormality noted.   Electronically Signed   By: Inez Catalina M.D.   On: 01/13/2015 16:07    Scheduled Meds:  Scheduled Meds: . amLODipine  5 mg Oral Daily  . aspirin  325 mg Oral Daily  . gabapentin  300 mg Oral BID  . insulin aspart  0-9 Units Subcutaneous 6 times per day  . nystatin   Topical TID  . ofloxacin  1 drop Left Eye TID [facility]  . pantoprazole  40 mg Oral Daily  . PARoxetine  20 mg Oral Daily  . [START ON 01/15/2015] pneumococcal 23 valent vaccine  0.5 mL Intramuscular Tomorrow-1000  . sodium chloride  3 mL Intravenous Q12H   Continuous Infusions:   Time spent on care of this patient: 35 minutes   Glen Raven, MD 01/14/2015, 2:31 PM  LOS: 0 days   [facility] Office  [phone] Pager - Text Page per [email or web address]  If 7PM-7AM, please contact night-coverage [email or web address]

## 2015-01-14 NOTE — Progress Notes (Signed)
Utilization review complete 

## 2015-01-14 NOTE — Consult Note (Signed)
NEURO HOSPITALIST CONSULT NOTE   Referring physician: Dr. Tomi Bamberger Reason for Consult: right arm numbness, bladder/bowel dysfunction, back pain  HPI:                                                                                                                                          Tamara Henry is an 54 y.o. female with a past medical history significant for DM, HTN, hypercholesterolemia, neuropathy, comes in for further evaluation of the above stated symptoms. Stated that she never had similar problems before and that all the above mentioned symptoms developed after sustaining a fall few days ago. Tamara Henry said that sheslipped down 5 stairs on bottom and almost immediately started having sharp lower back and tailbone pain that had worsened ever since, as well as off and on right arm numbness-tingling that developed the next day after falling. The pain remains localized to the lower back and is worsened by movements. Further, she indicated that she lost control of her bladder when she fell and states she is having no control of her bowels in the past few days, she has started wearing diapers because of this. No loss of bladder control, weakness of lower or upper limbs, imbalance, HA, vertigo, double vision, difficulty swallowing, slurred speech, language or vision impairment. Denies recent fever, vaccination, or infection. Her symptoms prompted MRI lumbar and T spine that I personally reviewed and showed multilevel spinal stenosis with mild foraminal narrowing and no frank thoracic cord involvement. However, there is equivocal findings for cervical spinal cord edema/myelomalacia. Exam by ED attending demonstrated lack of rectal tone. Serologies significant for Na 131, Cr 1.60, UA negative.   Past Medical History  Diagnosis Date  . Hypertension   . High cholesterol   . Diabetes mellitus without complication   . Neuropathy   . Acid reflux   . Pneumonia     Past  Surgical History  Procedure Laterality Date  . Tubal ligation      No family history on file.  Family History: no brain tumors, epilepsy, MS   Social History:  reports that she has quit smoking. She does not have any smokeless tobacco history on file. She reports that she does not drink alcohol or use illicit drugs.  Allergies  Allergen Reactions  . Lisinopril Other (See Comments)    Inflammation, coughing  . Metformin And Related Diarrhea  . Sulfa Antibiotics Itching    MEDICATIONS:  I have reviewed the patient's current medications.   ROS:                                                                                                                                       History obtained from the patient and chart review.  General ROS: negative for - chills, fatigue, fever, night sweats, or weight loss Psychological ROS: negative for - behavioral disorder, hallucinations, memory difficulties, mood swings or suicidal ideation Ophthalmic ROS: negative for - blurry vision, double vision, eye pain or loss of vision ENT ROS: negative for - epistaxis, nasal discharge, oral lesions, sore throat, tinnitus or vertigo Allergy and Immunology ROS: negative for - hives or itchy/watery eyes Hematological and Lymphatic ROS: negative for - bleeding problems, bruising or swollen lymph nodes Endocrine ROS: negative for - galactorrhea, hair pattern changes, polydipsia/polyuria or temperature intolerance Respiratory ROS: negative for - cough, hemoptysis, shortness of breath or wheezing Cardiovascular ROS: negative for - chest pain, dyspnea on exertion, edema or irregular heartbeat Gastrointestinal ROS: negative for - abdominal pain, diarrhea, hematemesis, nausea/vomiting or stool incontinence Genito-Urinary ROS: negative for - dysuria or hematuria Musculoskeletal ROS: negative for -  joint swelling or muscular weakness Neurological ROS: as noted in HPI Dermatological ROS: negative for rash and skin lesion changes   Physical exam: pleasant female in no apparent distress. Blood pressure 132/63, pulse 92, temperature 98.3 F (36.8 C), temperature source Oral, resp. rate 16, last menstrual period 12/15/2014, SpO2 97 %. Head: normocephalic. Neck: supple, no bruits, no JVD. Cardiac: no murmurs. Lungs: clear. Abdomen: soft, no tender, no mass. Extremities: no edema, clubbing, or cyanosis. Skin: no rash  Neurologic Examination:                                                                                                      General: Mental Status: Alert, oriented, thought content appropriate.  Speech fluent without evidence of aphasia.  Able to follow 3 step commands without difficulty. Cranial Nerves: II: Discs flat bilaterally; Visual fields grossly normal, pupils equal, round, reactive to light and accommodation III,IV, VI: ptosis not present, extra-ocular motions intact bilaterally V,VII: smile symmetric, facial light touch sensation normal bilaterally VIII: hearing normal bilaterally IX,X: uvula rises symmetrically XI: bilateral shoulder shrug XII: midline tongue extension without atrophy or fasciculations  Motor: Right : Upper extremity   5/5    Left:     Upper extremity   5/5  Lower extremity   5/5     Lower  extremity   5/5 Tone and bulk:normal tone throughout; no atrophy noted Sensory: Pinprick and light touch intact throughout, bilaterally Deep Tendon Reflexes:  1+ upper extremities, 2 at the patella. Plantars: Right: downgoing   Left: downgoing Cerebellar: normal finger-to-nose,  normal heel-to-shin test Gait:  Unable to test due to multiple leads CV: pulses palpable throughout    No results found for: CHOL  Results for orders placed or performed during the hospital encounter of 01/13/15 (from the past 48 hour(s))  CBC with Differential      Status: None   Collection Time: 01/13/15  4:43 PM  Result Value Ref Range   WBC 10.0 4.0 - 10.5 K/uL   RBC 4.46 3.87 - 5.11 MIL/uL   Hemoglobin 12.6 12.0 - 15.0 g/dL   HCT 38.8 36.0 - 46.0 %   MCV 87.0 78.0 - 100.0 fL   MCH 28.3 26.0 - 34.0 pg   MCHC 32.5 30.0 - 36.0 g/dL   RDW 13.7 11.5 - 15.5 %   Platelets 233 150 - 400 K/uL   Neutrophils Relative % 68 43 - 77 %   Neutro Abs 6.8 1.7 - 7.7 K/uL   Lymphocytes Relative 25 12 - 46 %   Lymphs Abs 2.5 0.7 - 4.0 K/uL   Monocytes Relative 6 3 - 12 %   Monocytes Absolute 0.6 0.1 - 1.0 K/uL   Eosinophils Relative 1 0 - 5 %   Eosinophils Absolute 0.1 0.0 - 0.7 K/uL   Basophils Relative 0 0 - 1 %   Basophils Absolute 0.0 0.0 - 0.1 K/uL  I-stat Chem 8, ED     Status: Abnormal   Collection Time: 01/13/15  5:13 PM  Result Value Ref Range   Sodium 139 135 - 145 mmol/L   Potassium 3.5 3.5 - 5.1 mmol/L   Chloride 103 101 - 111 mmol/L   BUN 34 (H) 6 - 20 mg/dL   Creatinine, Ser 1.60 (H) 0.44 - 1.00 mg/dL   Glucose, Bld 150 (H) 70 - 99 mg/dL   Calcium, Ion 1.15 1.12 - 1.23 mmol/L   TCO2 22 0 - 100 mmol/L   Hemoglobin 14.6 12.0 - 15.0 g/dL   HCT 43.0 36.0 - 46.0 %  POC occult blood, ED     Status: None   Collection Time: 01/13/15  6:57 PM  Result Value Ref Range   Fecal Occult Bld NEGATIVE NEGATIVE  Urinalysis, Routine w reflex microscopic     Status: Abnormal   Collection Time: 01/13/15  8:00 PM  Result Value Ref Range   Color, Urine GREEN (A) YELLOW    Comment: BIOCHEMICALS MAY BE AFFECTED BY COLOR   APPearance CLEAR CLEAR   Specific Gravity, Urine 1.016 1.005 - 1.030   pH 5.0 5.0 - 8.0   Glucose, UA NEGATIVE NEGATIVE mg/dL   Hgb urine dipstick NEGATIVE NEGATIVE   Bilirubin Urine NEGATIVE NEGATIVE   Ketones, ur NEGATIVE NEGATIVE mg/dL   Protein, ur 100 (A) NEGATIVE mg/dL   Urobilinogen, UA 0.2 0.0 - 1.0 mg/dL   Nitrite NEGATIVE NEGATIVE   Leukocytes, UA NEGATIVE NEGATIVE  Urine microscopic-add on     Status: Abnormal    Collection Time: 01/13/15  8:00 PM  Result Value Ref Range   Squamous Epithelial / LPF RARE RARE   WBC, UA 0-2 <3 WBC/hpf   Bacteria, UA FEW (A) RARE   Urine-Other AMORPHOUS URATES/PHOSPHATES     Dg Lumbar Spine Complete  01/13/2015   CLINICAL DATA:  54 year old female who fell  downstairs this morning. Pain. Initial encounter.  EXAM: LUMBAR SPINE - COMPLETE 4+ VIEW  COMPARISON:  Abdominal radiographs 06/20/2013 and earlier.  FINDINGS: Bone mineralization is within normal limits. Normal lumbar segmentation. Normal lumbar vertebral height and alignment and preserved disc spaces. Suboptimal oblique views, no pars fracture identified. Sacral ala and SI joints appear stable and within normal limits. Visible lower thoracic levels appear intact.  IMPRESSION: No acute fracture or listhesis identified in the lumbar spine.   Electronically Signed   By: Genevie Ann M.D.   On: 01/13/2015 16:07   Dg Sacrum/coccyx  01/13/2015   CLINICAL DATA:  54 year old female who fell downstairs this morning. Pain. Initial encounter.  EXAM: SACRUM AND COCCYX - 2+ VIEW  COMPARISON:  Lumbar radiographs from today reported separately. Abdominal radiographs 06/20/2013 and earlier.  FINDINGS: Bone mineralization is within normal limits. Sacral ala and SI joints appear stable and intact. Visible pelvis appears intact. Chronic pelvic phleboliths appear stable. Sacral and coccygeal segments on the lateral view appear within normal limits.  IMPRESSION: No acute fracture or dislocation identified about the sacrum or coccyx.   Electronically Signed   By: Genevie Ann M.D.   On: 01/13/2015 16:08   Ct Cervical Spine Wo Contrast  01/13/2015   CLINICAL DATA:  Fall down steps 6 days ago. Right arm numbness. Initial encounter.  EXAM: CT CERVICAL SPINE WITHOUT CONTRAST  TECHNIQUE: Multidetector CT imaging of the cervical spine was performed without intravenous contrast. Multiplanar CT image reconstructions were also generated.  COMPARISON:  None.  FINDINGS:  There is straightening of the normal cervical lordosis. There is no listhesis. Vertebral body heights are preserved. Mild disc space narrowing is present at C2-3 and C6-7. Anterior osteophytosis is present at C5-6 and C6-7. Degenerative changes are noted at the atlantodental articulation including narrowing of the atlantodental interval, sclerosis, and cystic/erosive changes. Mild pannus is noted about the dens. No cervical spine fracture is identified. There is at least mild left neural foraminal stenosis at C6-7. A 1.4 cm left thyroid lobe nodule is incidentally noted.  IMPRESSION: 1. No evidence of acute osseous abnormality in the cervical spine. 2. Mild lower cervical disc degeneration. Arthropathic changes about the dens.   Electronically Signed   By: Logan Bores   On: 01/13/2015 16:31   Mr Thoracic Spine Wo Contrast  01/13/2015   CLINICAL DATA:  Bowel incontinence (watery diarrhea) after fall, RIGHT arm paresthesias. Fell down steps 1 week ago.  EXAM: MRI THORACIC SPINE WITHOUT CONTRAST  TECHNIQUE: Multiplanar, multisequence MR imaging of the thoracic spine was performed. No intravenous contrast was administered.  COMPARISON:  MRI of the lumbar spine Jan 13, 2015  FINDINGS: Thoracic vertebral bodies and posterior elements are intact and aligned with maintenance of thoracic kyphosis. Intervertebral discs demonstrate normal morphology and signal characteristics. Mild upper thoracic chronic discogenic endplate changes without STIR signal abnormality to suggest acute osseous process. Mild congenital canal narrowing on the basis of foreshortened pedicles.  Equivocal findings for cervical spinal cord T2 bright signal/ myelomalacia partially imaged at the extreme edge of the field of view. 2 mm dilated central spinal canal at T5. Conus medullaris terminates at L1 appears normal morphology and signal characteristics. 3 cm LEFT thyroid nodule partially imaged. Paraspinal soft tissues unremarkable.  At T4-5 is a 2 mm  the central broad-based disc protrusion resulting in moderate canal stenosis and ventral cord deformity.  Small T5-6 through T7-8 broad-based disc bulges resulting in mild canal stenosis. No neural foraminal narrowing at any thoracic  level.  IMPRESSION: No acute fracture nor malalignment.  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  Equivocal findings for cervical spinal cord edema/myelomalacia, this could be artifact though, given symptoms of RIGHT arm paresthesias, MRI of the cervical spine should be considered.  3 cm LEFT thyroid nodule for which follow up thyroid sonogram is recommended on a nonemergent basis.   Electronically Signed   By: Elon Alas   On: 01/13/2015 23:06   Mr Lumbar Spine Wo Contrast  01/13/2015   CLINICAL DATA:  Cauda equina compression. Coccyx pain beginning 1 week ago after fall down the steps. Pain is worse with activity.  EXAM: MRI LUMBAR SPINE WITHOUT CONTRAST  TECHNIQUE: Multiplanar, multisequence MR imaging of the lumbar spine was performed. No intravenous contrast was administered.  COMPARISON:  Lumbar spine and sacrum radiographs from the same day.  FINDINGS: Normal signal is present in the conus medullaris which terminates at L1, within normal limits. Marrow signal, vertebral body heights, and alignment are normal.  The disc levels at L1-2 and above are normal.  L2-3: Moderate facet hypertrophy is present bilaterally. There is some crowding of the canal posteriorly. Normal disc signal is present without a focal protrusion.  L3-4: A mild broad-based disc protrusion is present. Moderate facet hypertrophy and short pedicles are noted. This results in mild foraminal narrowing on both sides.  L4-5: A broad-based disc protrusion is present. Moderate facet hypertrophy is present bilaterally. This results in mild subarticular and foraminal stenosis.  L5-S1: A leftward disc  protrusion is present. Moderate left and mild right subarticular stenosis is present. Facet hypertrophy is worse on the left. Mild foraminal narrowing is worse on the left.  IMPRESSION: 1. Multilevel acquired and congenital spinal stenosis as described. 2. Moderate facet hypertrophy at L2-3 without significant focal protrusion or stenosis. 3. Mild foraminal narrowing bilaterally at L3-4 secondary to facet hypertrophy and short pedicles. 4. Mild subarticular and foraminal narrowing bilaterally at L4-5. 5. Find moderate left and mild right subarticular stenosis at L5-S1. 6. Mild foraminal narrowing bilaterally at L5-S1 is worse on the left.   Electronically Signed   By: San Morelle M.D.   On: 01/13/2015 18:01   Dg Hips Bilat With Pelvis 3-4 Views  01/13/2015   CLINICAL DATA:  Fall downstairs this morning with persistent back and leg pain, initial encounter  EXAM: BILATERAL HIP (WITH PELVIS) 3-4 VIEWS  COMPARISON:  None.  FINDINGS: Pelvic ring is intact. No fracture or dislocation is seen. No gross soft tissue abnormality is noted.  IMPRESSION: No acute abnormality noted.   Electronically Signed   By: Inez Catalina M.D.   On: 01/13/2015 16:07   Assessment/Plan: 54 y/o with complains of back pain, intermittent right arm numbness-tingling, and lack of bowel control since a mechanical fall few days ago in which she said did not sustain head or neck trauma. Reportedly had absent rectal tone, but otherwise unremarkable neuro exam. MRI lumbar spine without abnormality to explain patient's symptoms/signs, MRI thoracic spine without frank cord involvement but questionable fndings for cervical spinal cord edema/myelomalacia.  Patient has no cord signs on exam , thus will require a dedicated cervical spine MRI to better elucidate such findings. Will follow up.  Dorian Pod, MD 01/14/2015, 12:14 AM  [facility]

## 2015-01-15 DIAGNOSIS — R159 Full incontinence of feces: Secondary | ICD-10-CM

## 2015-01-15 DIAGNOSIS — M792 Neuralgia and neuritis, unspecified: Principal | ICD-10-CM

## 2015-01-15 LAB — BASIC METABOLIC PANEL
Anion gap: 7 (ref 5–15)
BUN: 15 mg/dL (ref 6–20)
CALCIUM: 8.7 mg/dL — AB (ref 8.9–10.3)
CO2: 27 mmol/L (ref 22–32)
Chloride: 103 mmol/L (ref 101–111)
Creatinine, Ser: 1.14 mg/dL — ABNORMAL HIGH (ref 0.44–1.00)
GFR calc non Af Amer: 55 mL/min — ABNORMAL LOW (ref >60)
GLUCOSE: 150 mg/dL — AB (ref 70–99)
POTASSIUM: 4.2 mmol/L (ref 3.5–5.1)
Sodium: 137 mmol/L (ref 135–145)

## 2015-01-15 LAB — HEMOGLOBIN A1C
Hgb A1c MFr Bld: 10.3 % — ABNORMAL HIGH (ref 4.8–5.6)
Mean Plasma Glucose: 249 mg/dL

## 2015-01-15 LAB — GLUCOSE, CAPILLARY
GLUCOSE-CAPILLARY: 129 mg/dL — AB (ref 70–99)
GLUCOSE-CAPILLARY: 257 mg/dL — AB (ref 70–99)

## 2015-01-15 MED ORDER — PREDNISONE 10 MG PO TABS: 60.0000 mg | ORAL_TABLET | Freq: Every day | ORAL | Status: DC

## 2015-01-15 MED ORDER — DEXAMETHASONE SODIUM PHOSPHATE 4 MG/ML IJ SOLN
8.0000 mg | Freq: Once | INTRAMUSCULAR | Status: AC
  Administered 2015-01-15: 8 mg via INTRAVENOUS
  Filled 2015-01-15: qty 2

## 2015-01-15 MED ORDER — ZOLPIDEM TARTRATE 5 MG PO TABS: 5.0000 mg | ORAL_TABLET | Freq: Once | ORAL | Status: DC

## 2015-01-15 MED ORDER — SENNA 8.6 MG PO TABS: 1.0000 | ORAL_TABLET | Freq: Every day | ORAL | Status: DC | PRN

## 2015-01-15 MED ORDER — LIVING WELL WITH DIABETES BOOK
Freq: Once | Status: DC
  Filled 2015-01-15: qty 1

## 2015-01-15 MED ORDER — FREESTYLE SYSTEM KIT: 1.0000 | PACK | Freq: Three times a day (TID) | Status: DC

## 2015-01-15 MED ORDER — DOCUSATE SODIUM 100 MG PO CAPS: 100.0000 mg | ORAL_CAPSULE | Freq: Two times a day (BID) | ORAL | Status: DC

## 2015-01-15 MED ORDER — HYDROCODONE-ACETAMINOPHEN 5-325 MG PO TABS: 1.0000 | ORAL_TABLET | ORAL | Status: DC | PRN

## 2015-01-15 NOTE — Progress Notes (Signed)
NCM received referral to set up hhpt for patient, patient does not have any insurance and can not afford to pay out of pocket for hhpt, will not be able to set up for patient.

## 2015-01-15 NOTE — Care Management Note (Signed)
Case Management Note  Patient Details  Name: [name] MRN: [id number] Date of Birth: 11/11/1960  Subjective/Objective:          Patient goes to the [facility], her follow up apt is on 5/18 at 3:30, NCM spoke with Maudie Mercury and she states that patient has an orange card to use at the [facility].  Patient states she needs a glucometer to check blood sugars , Kim at the [facility] states they would not be able to get it for patient until Monday after 11 am, she states patient can go to [facility] and get the reli on meter because this is the same thing they use.            Action/Plan:   Expected Discharge Date:  01/16/15               Expected Discharge Plan:  Home/Self Care  In-House Referral:     Discharge planning Services  CM Consult, [facility], Follow-up appt scheduled  Post Acute Care Choice:    Choice offered to:     DME Arranged:    DME Agency:     HH Arranged:    HH Agency:     Status of Service:  In process, will continue to follow  Medicare Important Message Given:  No Date Medicare IM Given:    Medicare IM give by:    Date Additional Medicare IM Given:    Additional Medicare Important Message give by:     If discussed at [place] of Stay Meetings, dates discussed:    Additional Comments:  Zenon Mayo, RN 01/15/2015, 12:09 PM

## 2015-01-15 NOTE — Progress Notes (Signed)
Subjective: Patient continues to be in pain.  Foley now out and able to void spontaneously.  Continues to complain of numbness in the entire RUE and in the entire LLE.  LLE numbness is intermittent.    Objective: Current vital signs: BP 128/78 mmHg  Pulse 87  Temp(Src) 98.4 F (36.9 C) (Oral)  Resp 18  Ht 5\' 4"  (1.626 m)  Wt 104.962 kg (231 lb 6.4 oz)  BMI 39.70 kg/m2  SpO2 94%  LMP 12/15/2014 Vital signs in last 24 hours: Temp:  [98.4 F (36.9 C)-98.6 F (37 C)] 98.4 F (36.9 C) (05/06 0538) Pulse Rate:  [81-91] 87 (05/06 0538) Resp:  [17-18] 18 (05/06 0538) BP: (128-145)/(68-78) 128/78 mmHg (05/06 0538) SpO2:  [94 %-96 %] 94 % (05/06 0538) Weight:  [104.962 kg (231 lb 6.4 oz)] 104.962 kg (231 lb 6.4 oz) (05/06 0538)  Intake/Output from previous day: 05/05 0701 - 05/06 0700 In: 676.7 [P.O.:240; I.V.:436.7] Out: 3350 [Urine:3350] Intake/Output this shift:   Nutritional status: Diet Carb Modified Fluid consistency:: Thin; Room service appropriate?: Yes  Neurologic Exam: Mental Status: Alert, oriented, thought content appropriate. Speech fluent without evidence of aphasia. Able to follow 3 step commands without difficulty. Cranial Nerves: II: Discs flat bilaterally; Visual fields grossly normal, pupils equal, round, reactive to light and accommodation III,IV, VI: ptosis not present, extra-ocular motions intact bilaterally V,VII: smile symmetric, facial light touch sensation normal bilaterally VIII: hearing normal bilaterally IX,X: uvula rises symmetrically XI: bilateral shoulder shrug XII: midline tongue extension without atrophy or fasciculations Motor: Right :Upper extremity 5/5Left: Upper extremity 5/5 Lower extremity 5/5Lower extremity 5/5 Tone and bulk:normal tone throughout; no atrophy noted Sensory: Pinprick and light touch decreased in the RUE Deep  Tendon Reflexes:  1+ in the upper extremities and at the knees, absent at the ankles.   Plantars: Right: downgoingLeft: downgoing   Lab Results: Basic Metabolic Panel:  Recent Labs Lab 01/13/15 1713 01/14/15 0617 01/15/15 0547  NA 139 139 137  K 3.5 3.6 4.2  CL 103 105 103  CO2  --  26 27  GLUCOSE 150* 132* 150*  BUN 34* 23* 15  CREATININE 1.60* 1.28* 1.14*  CALCIUM  --  8.5* 8.7*  MG  --  1.7  --   PHOS  --  4.0  --     Liver Function Tests:  Recent Labs Lab 01/14/15 0617  AST 18  ALT 16  ALKPHOS 113  BILITOT 0.6  PROT 7.1  ALBUMIN 2.6*   No results for input(s): LIPASE, AMYLASE in the last 168 hours. No results for input(s): AMMONIA in the last 168 hours.  CBC:  Recent Labs Lab 01/13/15 1643 01/13/15 1713 01/14/15 0617  WBC 10.0  --  7.8  NEUTROABS 6.8  --   --   HGB 12.6 14.6 11.8*  HCT 38.8 43.0 36.7  MCV 87.0  --  88.4  PLT 233  --  215    Cardiac Enzymes: No results for input(s): CKTOTAL, CKMB, CKMBINDEX, TROPONINI in the last 168 hours.  Lipid Panel: No results for input(s): CHOL, TRIG, HDL, CHOLHDL, VLDL, LDLCALC in the last 168 hours.  CBG:  Recent Labs Lab 01/14/15 1219 01/14/15 1747 01/14/15 1955 01/14/15 2153 01/15/15 0815  GLUCAP 184* 147* 163* 157* 23*    Microbiology: Results for orders placed or performed during the hospital encounter of 01/13/15  Urine culture     Status: None   Collection Time: 01/13/15  8:00 PM  Result Value Ref Range Status  Specimen Description URINE, RANDOM  Final   Special Requests NONE  Final   Colony Count NO GROWTH Performed at [facility]   Final   Culture NO GROWTH Performed at [facility]   Final   Report Status 01/14/2015 FINAL  Final    Coagulation Studies: No results for input(s): LABPROT, INR in the last 72 hours.  Imaging: Dg Lumbar Spine Complete  01/13/2015   CLINICAL DATA:  54 year old female who fell downstairs this  morning. Pain. Initial encounter.  EXAM: LUMBAR SPINE - COMPLETE 4+ VIEW  COMPARISON:  Abdominal radiographs 06/20/2013 and earlier.  FINDINGS: Bone mineralization is within normal limits. Normal lumbar segmentation. Normal lumbar vertebral height and alignment and preserved disc spaces. Suboptimal oblique views, no pars fracture identified. Sacral ala and SI joints appear stable and within normal limits. Visible lower thoracic levels appear intact.  IMPRESSION: No acute fracture or listhesis identified in the lumbar spine.   Electronically Signed   By: Genevie Ann M.D.   On: 01/13/2015 16:07   Dg Sacrum/coccyx  01/13/2015   CLINICAL DATA:  54 year old female who fell downstairs this morning. Pain. Initial encounter.  EXAM: SACRUM AND COCCYX - 2+ VIEW  COMPARISON:  Lumbar radiographs from today reported separately. Abdominal radiographs 06/20/2013 and earlier.  FINDINGS: Bone mineralization is within normal limits. Sacral ala and SI joints appear stable and intact. Visible pelvis appears intact. Chronic pelvic phleboliths appear stable. Sacral and coccygeal segments on the lateral view appear within normal limits.  IMPRESSION: No acute fracture or dislocation identified about the sacrum or coccyx.   Electronically Signed   By: Genevie Ann M.D.   On: 01/13/2015 16:08   Ct Cervical Spine Wo Contrast  01/13/2015   CLINICAL DATA:  Fall down steps 6 days ago. Right arm numbness. Initial encounter.  EXAM: CT CERVICAL SPINE WITHOUT CONTRAST  TECHNIQUE: Multidetector CT imaging of the cervical spine was performed without intravenous contrast. Multiplanar CT image reconstructions were also generated.  COMPARISON:  None.  FINDINGS: There is straightening of the normal cervical lordosis. There is no listhesis. Vertebral body heights are preserved. Mild disc space narrowing is present at C2-3 and C6-7. Anterior osteophytosis is present at C5-6 and C6-7. Degenerative changes are noted at the atlantodental articulation including  narrowing of the atlantodental interval, sclerosis, and cystic/erosive changes. Mild pannus is noted about the dens. No cervical spine fracture is identified. There is at least mild left neural foraminal stenosis at C6-7. A 1.4 cm left thyroid lobe nodule is incidentally noted.  IMPRESSION: 1. No evidence of acute osseous abnormality in the cervical spine. 2. Mild lower cervical disc degeneration. Arthropathic changes about the dens.   Electronically Signed   By: Logan Bores   On: 01/13/2015 16:31   Mr Cervical Spine Wo Contrast  01/14/2015   CLINICAL DATA:  Slip and fall down 5 stairs 1 week ago. Bowel incontinence (watery diarrhea) after fall, RIGHT arm paresthesias.  EXAM: MRI CERVICAL SPINE WITHOUT CONTRAST  TECHNIQUE: Multiplanar, multisequence MR imaging of the cervical spine was performed. No intravenous contrast was administered.  COMPARISON:  CT of the cervical spine Jan 13, 2015 and MRI of the thoracic spine Jan 13, 2015  FINDINGS: Cervical vertebral bodies and posterior elements intact and aligned, straightened cervical lordosis patent. Mild C6-7 disc height loss, with decreased T2 signal within all disc consistent mild desiccation. Mild to moderate chronic discogenic endplate changes at P5-3, minimal at C4-5 and C5-6 without STIR signal abnormality to suggest acute  osseous process. Mild congenital canal narrowing on the basis of foreshortened pedicles.  Cervical spinal cord appears normal morphology and signal characteristics from the cervical medullary junction to level of T2-3, the most caudal well visualized level. Low signal about the atlantodental interval consistent with osteoarthrosis which was present on prior CT. 3 cm LEFT thyroid nodule incompletely imaged.  Level by level evaluation (moderately motion degraded axial sequences limit evaluation):  C2-3: Small broad-based disc bulge, uncovertebral hypertrophy and minimal facet arthropathy without canal stenosis. Mild RIGHT neural foraminal  narrowing.  C3-4: Uncovertebral hypertrophy, 2 mm central disc protrusion deforming the ventral spinal cord, uncovertebral hypertrophy and minimal facet arthropathy. Mild canal stenosis without neural foraminal narrowing.  C4-5: Small broad-based disc bulge and central disc protrusion measure up to 2-3 mm, contacting the ventral spinal cord. Uncovertebral hypertrophy and minimal facet arthropathy. Mild canal stenosis. Mild RIGHT neural foraminal narrowing.  C5-6: Small 2 mm broad-based disc protrusion asymmetric to the RIGHT. Uncovertebral hypertrophy and minimal facet arthropathy. No canal stenosis. Mild to moderate RIGHT neural foraminal narrowing.  C6-7: Moderate broad-based disc bulge asymmetric to the RIGHT, uncovertebral hypertrophy. Mild canal stenosis. Moderate RIGHT neural foraminal narrowing. Somewhat bright, enlarged exiting RIGHT C7 nerve.  C7-T1: No disc bulge, canal stenosis nor neural foraminal narrowing.  IMPRESSION: Straightened cervical lordosis without acute fracture nor malalignment.  Motion degraded examination. Multilevel disc bulges/protrusions, resulting in moderate RIGHT C6-7 neural foraminal narrowing associated with enlarged, edematous exiting RIGHT C7 nerve concerning for neuritis.  Mild canal stenosis C3-4, C4-5 and C6-7.  3 cm LEFT thyroid nodule for which follow up thyroid sonogram is recommended on a nonemergent basis.   Electronically Signed   By: Elon Alas   On: 01/14/2015 02:30   Mr Thoracic Spine Wo Contrast  01/13/2015   CLINICAL DATA:  Bowel incontinence (watery diarrhea) after fall, RIGHT arm paresthesias. Fell down steps 1 week ago.  EXAM: MRI THORACIC SPINE WITHOUT CONTRAST  TECHNIQUE: Multiplanar, multisequence MR imaging of the thoracic spine was performed. No intravenous contrast was administered.  COMPARISON:  MRI of the lumbar spine Jan 13, 2015  FINDINGS: Thoracic vertebral bodies and posterior elements are intact and aligned with maintenance of thoracic  kyphosis. Intervertebral discs demonstrate normal morphology and signal characteristics. Mild upper thoracic chronic discogenic endplate changes without STIR signal abnormality to suggest acute osseous process. Mild congenital canal narrowing on the basis of foreshortened pedicles.  Equivocal findings for cervical spinal cord T2 bright signal/ myelomalacia partially imaged at the extreme edge of the field of view. 2 mm dilated central spinal canal at T5. Conus medullaris terminates at L1 appears normal morphology and signal characteristics. 3 cm LEFT thyroid nodule partially imaged. Paraspinal soft tissues unremarkable.  At T4-5 is a 2 mm the central broad-based disc protrusion resulting in moderate canal stenosis and ventral cord deformity.  Small T5-6 through T7-8 broad-based disc bulges resulting in mild canal stenosis. No neural foraminal narrowing at any thoracic level.  IMPRESSION: No acute fracture nor malalignment.  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  Equivocal findings for cervical spinal cord edema/myelomalacia, this could be artifact though, given symptoms of RIGHT arm paresthesias, MRI of the cervical spine should be considered.  3 cm LEFT thyroid nodule for which follow up thyroid sonogram is recommended on a nonemergent basis.   Electronically Signed   By: Elon Alas   On: 01/13/2015 23:06   Mr Lumbar  Spine Wo Contrast  01/13/2015   CLINICAL DATA:  Cauda equina compression. Coccyx pain beginning 1 week ago after fall down the steps. Pain is worse with activity.  EXAM: MRI LUMBAR SPINE WITHOUT CONTRAST  TECHNIQUE: Multiplanar, multisequence MR imaging of the lumbar spine was performed. No intravenous contrast was administered.  COMPARISON:  Lumbar spine and sacrum radiographs from the same day.  FINDINGS: Normal signal is present in the conus medullaris which terminates at L1,  within normal limits. Marrow signal, vertebral body heights, and alignment are normal.  The disc levels at L1-2 and above are normal.  L2-3: Moderate facet hypertrophy is present bilaterally. There is some crowding of the canal posteriorly. Normal disc signal is present without a focal protrusion.  L3-4: A mild broad-based disc protrusion is present. Moderate facet hypertrophy and short pedicles are noted. This results in mild foraminal narrowing on both sides.  L4-5: A broad-based disc protrusion is present. Moderate facet hypertrophy is present bilaterally. This results in mild subarticular and foraminal stenosis.  L5-S1: A leftward disc protrusion is present. Moderate left and mild right subarticular stenosis is present. Facet hypertrophy is worse on the left. Mild foraminal narrowing is worse on the left.  IMPRESSION: 1. Multilevel acquired and congenital spinal stenosis as described. 2. Moderate facet hypertrophy at L2-3 without significant focal protrusion or stenosis. 3. Mild foraminal narrowing bilaterally at L3-4 secondary to facet hypertrophy and short pedicles. 4. Mild subarticular and foraminal narrowing bilaterally at L4-5. 5. Find moderate left and mild right subarticular stenosis at L5-S1. 6. Mild foraminal narrowing bilaterally at L5-S1 is worse on the left.   Electronically Signed   By: San Morelle M.D.   On: 01/13/2015 18:01   Dg Hips Bilat With Pelvis 3-4 Views  01/13/2015   CLINICAL DATA:  Fall downstairs this morning with persistent back and leg pain, initial encounter  EXAM: BILATERAL HIP (WITH PELVIS) 3-4 VIEWS  COMPARISON:  None.  FINDINGS: Pelvic ring is intact. No fracture or dislocation is seen. No gross soft tissue abnormality is noted.  IMPRESSION: No acute abnormality noted.   Electronically Signed   By: Inez Catalina M.D.   On: 01/13/2015 16:07    Medications:  I have reviewed the patient's current medications. Scheduled: . amLODipine  5 mg Oral Daily  . aspirin  325  mg Oral Daily  . enoxaparin (LOVENOX) injection  40 mg Subcutaneous Q24H  . gabapentin  300 mg Oral BID  . glipiZIDE  10 mg Oral BID AC  . insulin aspart  0-9 Units Subcutaneous TID AC & HS  . nystatin   Topical TID  . ofloxacin  1 drop Left Eye TID AC & HS  . pantoprazole  40 mg Oral Daily  . PARoxetine  20 mg Oral Daily  . pneumococcal 23 valent vaccine  0.5 mL Intramuscular Tomorrow-1000  . sodium chloride  3 mL Intravenous Q12H  . zolpidem  5 mg Oral Once    Assessment/Plan: Patient continues with pain and numbness intermittently in the LLE and more persistently in the RUE.  No further bowel incontinence or urinary retention.  On both extremities there is no radicular distribution-symptoms involve the entire extremity.  MRI of the thoracic, lumbar and cervical spines show multiple areas of disc bulge and neuroforaminal stenosis.  Unclear how this may be contributing to her symptoms.  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  This would not cause UE symptoms.  Recommendations: 1.  Decadron 8mg  IV now with steroid  taper to start tomorrow at 60mg  of Prednisone a day to decrease by 10mg  over other day until off.   2.  Follow up with neurology as an outpatient for determination of need for NCV/EMG and follow up of syringomyelia.   LOS: 1 day   Alexis Goodell, MD [facility] [phone] 01/15/2015  9:31 AM

## 2015-01-15 NOTE — Discharge Summary (Signed)
Physician Discharge Summary  Tamara Henry QIH:[id number] DOB: 02-06-1961 DOA: 01/13/2015  PCP: Elbert Ewings, FNP  Admit date: 01/13/2015 Discharge date: 01/15/2015  Time spent: 50 minutes  Recommendations for Outpatient Follow-up:  1. Follow-up with [facility] neurology  Discharge Condition: Stable Diet recommendation: Heart healthy low-sodium diabetic  Discharge Diagnoses:  Principal Problem:   Urinary retention/ Bowel incontinence Active Problems:   Numbness and tingling of right arm   Neuritis of upper extremity, C7   Acute renal failure   DM type 2, uncontrolled, with renal complications   Hypertension     History of present illness:  Tamara Henry is a 54 y.o. female with past medical history of hypertension, hyperlipidemia, diabetes mellitus who fell down a flight of stairs about 5 days ago and has since had severe sacral pain. In addition she developed watery diarrhea with incontinence of stool. She did not have any abdominal pain or nausea. She continued to have sacral pain and then developed difficulty with urinating and therefore presented to the ER. In addition she had numbness and tingling in her right arm. MRI of the neck performed in the ER revealed a C7 neuritis. A Foley catheter had to be placed due to urinary retention.  Hospital Course:  Principal Problem:  Urinary retention/fecal incontinence - have reviewed MRI reports (see reports below) and there is no structural abnormality that could be causing urinary retention or fecal incontinence.  -Fecal incontinence resolved soon after admission with control of sacral pain - Suspect retention of urine may have been secondary to severe sacral pain as well.  Foley catheter placed in the ER was removed on 5/5 and patient has been voiding normally since then.  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  Sacral pain -This occurred after a fall down the stairs 5 days prior to admission -X-rays of the lumbar and sacral spine do not reveal any fractures-suspect she has muscle injury or contusion in relation to her fall-at this time her pain is controlled with Vicodin for which I have given her prescription   Acute renal failure- CKD3 -Possibly in relation to urinary retention in addition to dehydration from perfuse diarrhea -She has been hydrated and creatinine is improving-by mouth intake is adequate therefore,  IV fluids stopped on 5/5 -Creatinine improved back to her baseline today   DM II (diabetes mellitus, type II), controlled -He can continue to take glipizide and Januvia as previously prescribed- although not listed on her home medications, she also takes 50 units of Lantus daily -A1c performed on 01/14/15 is 10.3 -Since she is being started on steroids, I have discussed with her in detail that her sugars will be higher than normal-she knows how to titrate her Lantus dose up for high sugars-I have also advised her to stay in touch with her family practitioner to decide on the amount of Lantus she will need based on how elevated her glucose is-she does not have a glucometer and we are  working with case management to help her obtain a glucometer prior to discharge today  Abnormal TSH - free T4 normal   Consultations:  Neurology  Discharge Exam: Filed Weights   01/14/15 0259 01/15/15 0538  Weight: 105.96 kg (233 lb 9.6 oz) 104.962 kg (231 lb 6.4 oz)   Filed Vitals:    01/15/15 0951  BP: 131/60  Pulse:   Temp:   Resp:     General: AAO x 3, no distress Cardiovascular: RRR, no murmurs  Respiratory: clear to auscultation bilaterally GI: soft, non-tender, non-distended, bowel sound positive  Discharge Instructions You were cared for by a hospitalist during your hospital stay. If you have any questions about your discharge medications or the care you received while you were in the hospital after you are discharged, you can call the unit and asked to speak with the hospitalist on call if the hospitalist that took care of you is not available. Once you are discharged, your primary care physician will handle any further medical issues. Please note that NO REFILLS for any discharge medications will be authorized once you are discharged, as it is imperative that you return to your primary care physician (or establish a relationship with a primary care physician if you do not have one) for your aftercare needs so that they can reassess your need for medications and monitor your lab values.      Discharge Instructions    Discharge instructions    Complete by:  As directed   Low sodium, heart healthy, diabetic diet     Increase activity slowly    Complete by:  As directed             Medication List    TAKE these medications        amLODipine 5 MG tablet  Commonly known as:  NORVASC  Take 5 mg by mouth daily.     aspirin 325 MG tablet  Take 325 mg by mouth daily.     docusate sodium 100 MG capsule  Commonly known as:  COLACE  Take 1 capsule (100 mg total) by mouth 2 (two) times daily.     gabapentin 300 MG capsule  Commonly known as:  NEURONTIN  Take 300 mg by mouth 2 (two) times daily.     glipiZIDE 10 MG tablet  Commonly known as:  GLUCOTROL  Take 10 mg by mouth 2 (two) times daily before a meal.     glucose monitoring kit monitoring kit  1 each by Does not apply route 4 (four) times daily - after meals and at bedtime. 1 month Diabetic  Testing Supplies for QAC-QHS accuchecks.     guaiFENesin 600 MG 12 hr tablet  Commonly known as:  MUCINEX  Take 2 tablets (1,200 mg total) by mouth 2 (two) times daily.     HYDROcodone-acetaminophen 5-325 MG per tablet  Commonly known as:  NORCO/VICODIN  Take 1-2 tablets by mouth every 4 (four) hours as needed for moderate pain.     JANUVIA PO  Take 1 tablet by mouth daily.     MAGNESIUM PO  Take 1 tablet by mouth daily.     Melatonin 3 MG Caps  Take 3 mg by mouth at bedtime.     ofloxacin 0.3 % ophthalmic solution  Commonly known as:  OCUFLOX  Place 1 drop into the left eye 4 (four) times daily.     omeprazole 20 MG capsule  Commonly known as:  PRILOSEC  Take 20 mg by  mouth daily.     ondansetron 4 MG tablet  Commonly known as:  ZOFRAN  Take 1 tablet (4 mg total) by mouth every 6 (six) hours.     ONE-A-DAY WOMENS PO  Take 1 tablet by mouth daily.     PARoxetine 20 MG tablet  Commonly known as:  PAXIL  Take 20 mg by mouth every morning.     predniSONE 10 MG tablet  Commonly known as:  DELTASONE  Take 6 tablets (60 mg total) by mouth daily with breakfast.     senna 8.6 MG Tabs tablet  Commonly known as:  SENOKOT  Take 1 tablet (8.6 mg total) by mouth daily as needed for mild constipation.     VYTORIN PO  Take 1 tablet by mouth daily.       also taking Lantus 50 units daily   Allergies  Allergen Reactions  . Lisinopril Other (See Comments)    Inflammation, coughing  . Metformin And Related Diarrhea  . Sulfa Antibiotics Itching   Follow-up Information    Follow up with [facility]     On 01/20/2015.   Why:  10 am for hospital follow up   Contact information:   [address] [place] [address] [phone]      Follow up with [facility] On 01/15/2015.   Why:  NS called to schedule appointment, NS was unable to schedule appointment. NS left voicemail for  "DIANE" to return a call back  to [facility] @ [phone]. NS alert nurse of [facility] at 10:15 on 01/15/15. Nurse acknowledges.   Contact information:   [address] [place] [address] [phone]       The results of significant diagnostics from this hospitalization (including imaging, microbiology, ancillary and laboratory) are listed below for reference.    Significant Diagnostic Studies: Dg Lumbar Spine Complete  01/13/2015   CLINICAL DATA:  54 year old female who fell downstairs this morning. Pain. Initial encounter.  EXAM: LUMBAR SPINE - COMPLETE 4+ VIEW  COMPARISON:  Abdominal radiographs 06/20/2013 and earlier.  FINDINGS: Bone mineralization is within normal limits. Normal lumbar segmentation. Normal lumbar vertebral height and alignment and preserved disc spaces. Suboptimal oblique views, no pars fracture identified. Sacral ala and SI joints appear stable and within normal limits. Visible lower thoracic levels appear intact.  IMPRESSION: No acute fracture or listhesis identified in the lumbar spine.   Electronically Signed   By: Genevie Ann M.D.   On: 01/13/2015 16:07   Dg Sacrum/coccyx  01/13/2015   CLINICAL DATA:  54 year old female who fell downstairs this morning. Pain. Initial encounter.  EXAM: SACRUM AND COCCYX - 2+ VIEW  COMPARISON:  Lumbar radiographs from today reported separately. Abdominal radiographs 06/20/2013 and earlier.  FINDINGS: Bone mineralization is within normal limits. Sacral ala and SI joints appear stable and intact. Visible pelvis appears intact. Chronic pelvic phleboliths appear stable. Sacral and coccygeal segments on the lateral view appear within normal limits.  IMPRESSION: No acute fracture or dislocation identified about the sacrum or coccyx.   Electronically Signed   By: Genevie Ann M.D.   On: 01/13/2015 16:08   Ct Cervical Spine Wo Contrast  01/13/2015   CLINICAL DATA:  Fall down steps 6 days ago. Right arm numbness. Initial encounter.  EXAM: CT CERVICAL SPINE WITHOUT CONTRAST   TECHNIQUE: Multidetector CT imaging of the cervical spine was performed without intravenous contrast. Multiplanar CT image reconstructions were also generated.  COMPARISON:  None.  FINDINGS: There is straightening of the normal cervical lordosis. There is no listhesis. Vertebral body heights are preserved. Mild disc space narrowing is present at C2-3 and C6-7. Anterior osteophytosis is present at C5-6 and C6-7. Degenerative changes are noted at the atlantodental articulation including narrowing of the atlantodental interval, sclerosis, and cystic/erosive changes. Mild pannus is noted about the dens. No cervical spine fracture is identified. There is at least mild left neural foraminal stenosis at C6-7. A 1.4 cm left thyroid lobe nodule is incidentally noted.  IMPRESSION: 1. No evidence of acute osseous abnormality in the cervical spine. 2. Mild lower cervical disc degeneration. Arthropathic changes about the dens.   Electronically Signed   By: Logan Bores   On: 01/13/2015 16:31   Mr Cervical Spine Wo Contrast  01/14/2015   CLINICAL DATA:  Slip and fall down 5 stairs 1 week ago. Bowel incontinence (watery diarrhea) after fall, RIGHT arm paresthesias.  EXAM: MRI CERVICAL SPINE WITHOUT CONTRAST  TECHNIQUE: Multiplanar, multisequence MR imaging of the cervical spine was performed. No intravenous contrast was administered.  COMPARISON:  CT of the cervical spine Jan 13, 2015 and MRI of the thoracic spine Jan 13, 2015  FINDINGS: Cervical vertebral bodies and posterior elements intact and aligned, straightened cervical lordosis patent. Mild C6-7 disc height loss, with decreased T2 signal within all disc consistent mild desiccation. Mild to moderate chronic discogenic endplate changes at G9-2, minimal at C4-5 and C5-6 without STIR signal abnormality to suggest acute osseous process. Mild congenital canal narrowing on the basis of foreshortened pedicles.  Cervical spinal cord appears normal morphology and signal  characteristics from the cervical medullary junction to level of T2-3, the most caudal well visualized level. Low signal about the atlantodental interval consistent with osteoarthrosis which was present on prior CT. 3 cm LEFT thyroid nodule incompletely imaged.  Level by level evaluation (moderately motion degraded axial sequences limit evaluation):  C2-3: Small broad-based disc bulge, uncovertebral hypertrophy and minimal facet arthropathy without canal stenosis. Mild RIGHT neural foraminal narrowing.  C3-4: Uncovertebral hypertrophy, 2 mm central disc protrusion deforming the ventral spinal cord, uncovertebral hypertrophy and minimal facet arthropathy. Mild canal stenosis without neural foraminal narrowing.  C4-5: Small broad-based disc bulge and central disc protrusion measure up to 2-3 mm, contacting the ventral spinal cord. Uncovertebral hypertrophy and minimal facet arthropathy. Mild canal stenosis. Mild RIGHT neural foraminal narrowing.  C5-6: Small 2 mm broad-based disc protrusion asymmetric to the RIGHT. Uncovertebral hypertrophy and minimal facet arthropathy. No canal stenosis. Mild to moderate RIGHT neural foraminal narrowing.  C6-7: Moderate broad-based disc bulge asymmetric to the RIGHT, uncovertebral hypertrophy. Mild canal stenosis. Moderate RIGHT neural foraminal narrowing. Somewhat bright, enlarged exiting RIGHT C7 nerve.  C7-T1: No disc bulge, canal stenosis nor neural foraminal narrowing.  IMPRESSION: Straightened cervical lordosis without acute fracture nor malalignment.  Motion degraded examination. Multilevel disc bulges/protrusions, resulting in moderate RIGHT C6-7 neural foraminal narrowing associated with enlarged, edematous exiting RIGHT C7 nerve concerning for neuritis.  Mild canal stenosis C3-4, C4-5 and C6-7.  3 cm LEFT thyroid nodule for which follow up thyroid sonogram is recommended on a nonemergent basis.   Electronically Signed   By: Elon Alas   On: 01/14/2015 02:30   Mr  Thoracic Spine Wo Contrast  01/13/2015   CLINICAL DATA:  Bowel incontinence (watery diarrhea) after fall, RIGHT arm paresthesias. Fell down steps 1 week ago.  EXAM: MRI THORACIC SPINE WITHOUT CONTRAST  TECHNIQUE: Multiplanar, multisequence MR imaging of the thoracic spine was  performed. No intravenous contrast was administered.  COMPARISON:  MRI of the lumbar spine Jan 13, 2015  FINDINGS: Thoracic vertebral bodies and posterior elements are intact and aligned with maintenance of thoracic kyphosis. Intervertebral discs demonstrate normal morphology and signal characteristics. Mild upper thoracic chronic discogenic endplate changes without STIR signal abnormality to suggest acute osseous process. Mild congenital canal narrowing on the basis of foreshortened pedicles.  Equivocal findings for cervical spinal cord T2 bright signal/ myelomalacia partially imaged at the extreme edge of the field of view. 2 mm dilated central spinal canal at T5. Conus medullaris terminates at L1 appears normal morphology and signal characteristics. 3 cm LEFT thyroid nodule partially imaged. Paraspinal soft tissues unremarkable.  At T4-5 is a 2 mm the central broad-based disc protrusion resulting in moderate canal stenosis and ventral cord deformity.  Small T5-6 through T7-8 broad-based disc bulges resulting in mild canal stenosis. No neural foraminal narrowing at any thoracic level.  IMPRESSION: No acute fracture nor malalignment.  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  Equivocal findings for cervical spinal cord edema/myelomalacia, this could be artifact though, given symptoms of RIGHT arm paresthesias, MRI of the cervical spine should be considered.  3 cm LEFT thyroid nodule for which follow up thyroid sonogram is recommended on a nonemergent basis.   Electronically Signed   By: Elon Alas   On: 01/13/2015 23:06   Mr  Lumbar Spine Wo Contrast  01/13/2015   CLINICAL DATA:  Cauda equina compression. Coccyx pain beginning 1 week ago after fall down the steps. Pain is worse with activity.  EXAM: MRI LUMBAR SPINE WITHOUT CONTRAST  TECHNIQUE: Multiplanar, multisequence MR imaging of the lumbar spine was performed. No intravenous contrast was administered.  COMPARISON:  Lumbar spine and sacrum radiographs from the same day.  FINDINGS: Normal signal is present in the conus medullaris which terminates at L1, within normal limits. Marrow signal, vertebral body heights, and alignment are normal.  The disc levels at L1-2 and above are normal.  L2-3: Moderate facet hypertrophy is present bilaterally. There is some crowding of the canal posteriorly. Normal disc signal is present without a focal protrusion.  L3-4: A mild broad-based disc protrusion is present. Moderate facet hypertrophy and short pedicles are noted. This results in mild foraminal narrowing on both sides.  L4-5: A broad-based disc protrusion is present. Moderate facet hypertrophy is present bilaterally. This results in mild subarticular and foraminal stenosis.  L5-S1: A leftward disc protrusion is present. Moderate left and mild right subarticular stenosis is present. Facet hypertrophy is worse on the left. Mild foraminal narrowing is worse on the left.  IMPRESSION: 1. Multilevel acquired and congenital spinal stenosis as described. 2. Moderate facet hypertrophy at L2-3 without significant focal protrusion or stenosis. 3. Mild foraminal narrowing bilaterally at L3-4 secondary to facet hypertrophy and short pedicles. 4. Mild subarticular and foraminal narrowing bilaterally at L4-5. 5. Find moderate left and mild right subarticular stenosis at L5-S1. 6. Mild foraminal narrowing bilaterally at L5-S1 is worse on the left.   Electronically Signed   By: San Morelle M.D.   On: 01/13/2015 18:01   Dg Hips Bilat With Pelvis 3-4 Views  01/13/2015   CLINICAL DATA:  Fall  downstairs this morning with persistent back and leg pain, initial encounter  EXAM: BILATERAL HIP (WITH PELVIS) 3-4 VIEWS  COMPARISON:  None.  FINDINGS: Pelvic ring is intact. No fracture or dislocation is  seen. No gross soft tissue abnormality is noted.  IMPRESSION: No acute abnormality noted.   Electronically Signed   By: Inez Catalina M.D.   On: 01/13/2015 16:07    Microbiology: Recent Results (from the past 240 hour(s))  Urine culture     Status: None   Collection Time: 01/13/15  8:00 PM  Result Value Ref Range Status   Specimen Description URINE, RANDOM  Final   Special Requests NONE  Final   Colony Count NO GROWTH Performed at [facility]   Final   Culture NO GROWTH Performed at [facility]   Final   Report Status 01/14/2015 FINAL  Final     Labs: Basic Metabolic Panel:  Recent Labs Lab 01/13/15 1713 01/14/15 0617 01/15/15 0547  NA 139 139 137  K 3.5 3.6 4.2  CL 103 105 103  CO2  --  26 27  GLUCOSE 150* 132* 150*  BUN 34* 23* 15  CREATININE 1.60* 1.28* 1.14*  CALCIUM  --  8.5* 8.7*  MG  --  1.7  --   PHOS  --  4.0  --    Liver Function Tests:  Recent Labs Lab 01/14/15 0617  AST 18  ALT 16  ALKPHOS 113  BILITOT 0.6  PROT 7.1  ALBUMIN 2.6*   No results for input(s): LIPASE, AMYLASE in the last 168 hours. No results for input(s): AMMONIA in the last 168 hours. CBC:  Recent Labs Lab 01/13/15 1643 01/13/15 1713 01/14/15 0617  WBC 10.0  --  7.8  NEUTROABS 6.8  --   --   HGB 12.6 14.6 11.8*  HCT 38.8 43.0 36.7  MCV 87.0  --  88.4  PLT 233  --  215   Cardiac Enzymes: No results for input(s): CKTOTAL, CKMB, CKMBINDEX, TROPONINI in the last 168 hours. BNP: BNP (last 3 results) No results for input(s): BNP in the last 8760 hours.  ProBNP (last 3 results) No results for input(s): PROBNP in the last 8760 hours.  CBG:  Recent Labs Lab 01/14/15 1219 01/14/15 1747 01/14/15 1955 01/14/15 2153 01/15/15 0815  GLUCAP 184* 147*  163* 157* 129*       Signed:  Debbe Odea, MD [facility] 01/15/2015, 10:42 AM

## 2015-01-15 NOTE — Progress Notes (Signed)
Chaplain responded to consult.  Met with pt in room.  Pt shared that she has 6 grandchildren and she moved here to help her daughter who is a single mother take care of children.  Since pt has been in hospital, pt's pastor has been helping to care for the children (grandchildren).  Pt is member of [employer] here in [place] and faith is of great importance to pt.  Pt is grateful for her church and pastor's support.  Pt said she is ready to go home but really needed the rest.  Chaplain provided emotional and spiritual support as well as ministry of presence, empathetic listening and prayer.  Chaplain will follow up as needed.    01/15/15 0900  Clinical Encounter Type  Visited With Patient  Visit Type Initial;Spiritual support  Referral From Nurse  Spiritual Encounters  Spiritual Needs Prayer;Emotional  Stress Factors  Patient Stress Factors Family relationships   Tamara Henry 01/15/2015 9:50 AM

## 2015-01-15 NOTE — Progress Notes (Signed)
Nsg Discharge Note  Admit Date:  01/13/2015 Discharge date: 01/15/2015   Tamara Henry to be D/C'd Home per MD order.  AVS completed.  Copy for chart, and copy for patient signed, and dated. Patient/caregiver able to verbalize understanding.  Discharge Medication:   Medication List    TAKE these medications        amLODipine 5 MG tablet  Commonly known as:  NORVASC  Take 5 mg by mouth daily.     aspirin 325 MG tablet  Take 325 mg by mouth daily.     docusate sodium 100 MG capsule  Commonly known as:  COLACE  Take 1 capsule (100 mg total) by mouth 2 (two) times daily.     gabapentin 300 MG capsule  Commonly known as:  NEURONTIN  Take 300 mg by mouth 2 (two) times daily.     glipiZIDE 10 MG tablet  Commonly known as:  GLUCOTROL  Take 10 mg by mouth 2 (two) times daily before a meal.     glucose monitoring kit monitoring kit  1 each by Does not apply route 4 (four) times daily - after meals and at bedtime. 1 month Diabetic Testing Supplies for QAC-QHS accuchecks.     guaiFENesin 600 MG 12 hr tablet  Commonly known as:  MUCINEX  Take 2 tablets (1,200 mg total) by mouth 2 (two) times daily.     HYDROcodone-acetaminophen 5-325 MG per tablet  Commonly known as:  NORCO/VICODIN  Take 1-2 tablets by mouth every 4 (four) hours as needed for moderate pain.     JANUVIA PO  Take 1 tablet by mouth daily.     MAGNESIUM PO  Take 1 tablet by mouth daily.     Melatonin 3 MG Caps  Take 3 mg by mouth at bedtime.     ofloxacin 0.3 % ophthalmic solution  Commonly known as:  OCUFLOX  Place 1 drop into the left eye 4 (four) times daily.     omeprazole 20 MG capsule  Commonly known as:  PRILOSEC  Take 20 mg by mouth daily.     ondansetron 4 MG tablet  Commonly known as:  ZOFRAN  Take 1 tablet (4 mg total) by mouth every 6 (six) hours.     ONE-A-DAY WOMENS PO  Take 1 tablet by mouth daily.     PARoxetine 20 MG tablet  Commonly known as:  PAXIL  Take 20 mg by mouth every morning.      predniSONE 10 MG tablet  Commonly known as:  DELTASONE  Take 6 tablets (60 mg total) by mouth daily with breakfast.     senna 8.6 MG Tabs tablet  Commonly known as:  SENOKOT  Take 1 tablet (8.6 mg total) by mouth daily as needed for mild constipation.     VYTORIN PO  Take 1 tablet by mouth daily.        Discharge Assessment: Filed Vitals:   01/15/15 0951  BP: 131/60  Pulse:   Temp:   Resp:    Skin clean, dry and intact without evidence of skin break down, no evidence of skin tears noted. IV catheter discontinued intact. Site without signs and symptoms of complications - no redness or edema noted at insertion site, patient denies c/o pain - only slight tenderness at site.  Dressing with slight pressure applied.  D/c Instructions-Education: Discharge instructions given to patient/family with verbalized understanding. D/c education completed with patient/family including follow up instructions, medication list, d/c activities limitations if indicated, with other  d/c instructions as indicated by MD - patient able to verbalize understanding, all questions fully answered. Patient instructed to return to ED, call 911, or call MD for any changes in condition.  Patient escorted via [place], and D/C home via private auto.  [name] Margaretha Sheffield, RN 01/15/2015 3:08 PM

## 2015-01-20 ENCOUNTER — Inpatient Hospital Stay: Payer: No Typology Code available for payment source | Admitting: Family Medicine

## 2015-01-26 ENCOUNTER — Encounter: Payer: Self-pay | Admitting: Diagnostic Neuroimaging

## 2015-01-26 ENCOUNTER — Ambulatory Visit: Payer: MEDICAID | Admitting: Diagnostic Neuroimaging

## 2015-02-05 ENCOUNTER — Encounter: Payer: Self-pay | Admitting: Diagnostic Neuroimaging

## 2015-02-05 ENCOUNTER — Ambulatory Visit (INDEPENDENT_AMBULATORY_CARE_PROVIDER_SITE_OTHER): Payer: Self-pay | Admitting: Diagnostic Neuroimaging

## 2015-02-05 VITALS — BP 129/81 | HR 96 | Ht 64.0 in | Wt 240.8 lb

## 2015-02-05 DIAGNOSIS — M545 Low back pain, unspecified: Secondary | ICD-10-CM

## 2015-02-05 DIAGNOSIS — M5412 Radiculopathy, cervical region: Secondary | ICD-10-CM

## 2015-02-05 DIAGNOSIS — E1142 Type 2 diabetes mellitus with diabetic polyneuropathy: Secondary | ICD-10-CM

## 2015-02-05 NOTE — Patient Instructions (Signed)
Increase gabapentin to 300mg  three times per day.  Try cyclobenzaprine 10mg  up to three times per day as needed.  Try physical therapy.  Apply for health insurance.

## 2015-02-05 NOTE — Progress Notes (Signed)
[facility]  PATIENT: Tamara Henry DOB: Sep 13, 1960  REFERRING CLINICIAN: [facility] HISTORY FROM: patient  REASON FOR VISIT: new consult    HISTORICAL  CHIEF COMPLAINT:  Chief Complaint  Patient presents with  . New Evaluation    after d/c from the hospital, right arm numbness and tingling    HISTORY OF PRESENT ILLNESS:   54 year old female with hypertension, hypercholesterolemia, diabetes, diabetic neuropathy, depression, anxiety, here for evaluation of right arm pain, back pain, after slipping and falling from her front steps in April 2016. Patient was leaving her home, and walking down her front steps, in the rain, when she slipped on the steps and fell down onto her buttocks. She had significant pain in her bottom, sacral region. She had some pain in her right arm. She waited a few days and symptoms continued. Patient went to the emergency room for evaluation and was admitted to the hospital. She had developed some loose motions/fecal incontinence and therefore concern for spinal cord injury was raised. 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  Patient has been on hydrocodone for some pain management. Unfortunately she has no insurance and has not been able to receive home health physical therapy. She has been on gabapentin from before for diabetic neuropathy. Patient still has pain in her lower back with some spasms. She has some numbness in her right hand.    REVIEW OF SYSTEMS: Full 14 system review of systems performed and notable only for depression anxiety not asleep decreased energy incontinence diarrhea urination problems.  ALLERGIES: Allergies  Allergen Reactions  . Lisinopril Other (See Comments)    Inflammation, coughing  . Metformin And Related Diarrhea  . Sulfa  Antibiotics Itching    HOME MEDICATIONS: Outpatient Prescriptions Prior to Visit  Medication Sig Dispense Refill  . amLODipine (NORVASC) 5 MG tablet Take 5 mg by mouth daily.    Marland Kitchen aspirin 325 MG tablet Take 325 mg by mouth daily.    Marland Kitchen docusate sodium (COLACE) 100 MG capsule Take 1 capsule (100 mg total) by mouth 2 (two) times daily. 10 capsule 0  . Ezetimibe-Simvastatin (VYTORIN PO) Take 1 tablet by mouth daily.    Marland Kitchen gabapentin (NEURONTIN) 300 MG capsule Take 300 mg by mouth 2 (two) times daily.    Marland Kitchen glipiZIDE (GLUCOTROL) 10 MG tablet Take 10 mg by mouth 2 (two) times daily before a meal.    . glucose monitoring kit (FREESTYLE) monitoring kit 1 each by Does not apply route 4 (four) times daily - after meals and at bedtime. 1 month Diabetic Testing Supplies for QAC-QHS accuchecks. 1 each 1  . guaiFENesin (MUCINEX) 600 MG 12 hr tablet Take 2 tablets (1,200 mg total) by mouth 2 (two) times daily. 30 tablet 0  . HYDROcodone-acetaminophen (NORCO/VICODIN) 5-325 MG per tablet Take 1-2 tablets by mouth every 4 (four) hours as needed for moderate pain. 30 tablet 0  . MAGNESIUM PO Take 1 tablet by mouth daily.    . Melatonin 3 MG CAPS Take 3 mg by mouth at bedtime.    . Multiple Vitamins-Calcium (ONE-A-DAY WOMENS PO) Take 1 tablet by mouth daily.    Marland Kitchen ofloxacin (OCUFLOX) 0.3 % ophthalmic solution Place 1 drop into the left eye 4 (four) times daily.    Marland Kitchen omeprazole (PRILOSEC) 20 MG capsule Take 20 mg by mouth daily.    . ondansetron (  ZOFRAN) 4 MG tablet Take 1 tablet (4 mg total) by mouth every 6 (six) hours. 12 tablet 0  . PARoxetine (PAXIL) 20 MG tablet Take 20 mg by mouth every morning.    . predniSONE (DELTASONE) 10 MG tablet Take 6 tablets (60 mg total) by mouth daily with breakfast. 43 tablet 0  . senna (SENOKOT) 8.6 MG TABS tablet Take 1 tablet (8.6 mg total) by mouth daily as needed for mild constipation. 120 each 0  . SitaGLIPtin Phosphate (JANUVIA PO) Take 1 tablet by mouth daily.     No  facility-administered medications prior to visit.    PAST MEDICAL HISTORY: Past Medical History  Diagnosis Date  . Hypertension   . High cholesterol   . Diabetes mellitus without complication   . Neuropathy   . Acid reflux   . Pneumonia     PAST SURGICAL HISTORY: Past Surgical History  Procedure Laterality Date  . Tubal ligation      FAMILY HISTORY: Family History  Problem Relation Age of Onset  . Pancreatitis Mother   . Diabetes type II Mother   . CAD Father   . Diabetes type II Father   . Diabetes type II Sister   . Diabetes type II Brother     SOCIAL HISTORY:  History   Social History  . Marital Status: Married    Spouse Name: N/A  . Number of Children: 4  . Years of Education: 12   Occupational History  . disabled    Social History Main Topics  . Smoking status: Former Research scientist (life sciences)  . Smokeless tobacco: Not on file  . Alcohol Use: No  . Drug Use: No  . Sexual Activity: Not on file   Other Topics Concern  . Not on file   Social History Narrative   Lives at home with daughter Lanelle Bal   Drinks no caffeine      PHYSICAL EXAM  Filed Vitals:   02/05/15 1105  BP: 129/81  Pulse: 96  Height: 5' 4" (1.626 m)  Weight: 240 lb 12.8 oz (109.226 kg)    Body mass index is 41.31 kg/(m^2).   Visual Acuity Screening   Right eye Left eye Both eyes  Without correction: 20/100 20/200   With correction:       No flowsheet data found.  GENERAL EXAM: Patient is in no distress; well developed, nourished and groomed; neck is supple  CARDIOVASCULAR: Regular rate and rhythm, no murmurs, no carotid bruits  NEUROLOGIC: MENTAL STATUS: awake, alert, oriented to person, place and time, recent and remote memory intact, normal attention and concentration, language fluent, comprehension intact, naming intact, fund of knowledge appropriate CRANIAL NERVE: no papilledema on fundoscopic exam, pupils equal and reactive to light, visual fields full to confrontation,  extraocular muscles intact, no nystagmus, facial sensation; SLIGHT ASYMM OF FACE AT REST (DECR RIGHT NL FOLD); BUT NORMAL UPPER LOWER FACIAL STRENGTH TESTING; hearing intact, palate elevates symmetrically, uvula midline, shoulder shrug symmetric, tongue midline. HOARSE VOICE. MOTOR: normal bulk and tone, full strength in the BUE, BLE SENSORY: normal and symmetric to light touch; VIB < 8 SEC AT TOES; DECR PP IN TOES/FEET COORDINATION: finger-nose-finger, fine finger movements  normal REFLEXES: deep tendon reflexes present and symmetric; TRACE AT ANKLES GAIT/STATION: narrow based gait; romberg is negative    DIAGNOSTIC DATA (LABS, IMAGING, TESTING) - I reviewed patient records, labs, notes, testing and imaging myself where available.  Lab Results  Component Value Date   WBC 7.8 01/14/2015   HGB 11.8*  01/14/2015   HCT 36.7 01/14/2015   MCV 88.4 01/14/2015   PLT 215 01/14/2015      Component Value Date/Time   NA 137 01/15/2015 0547   K 4.2 01/15/2015 0547   CL 103 01/15/2015 0547   CO2 27 01/15/2015 0547   GLUCOSE 150* 01/15/2015 0547   BUN 15 01/15/2015 0547   CREATININE 1.14* 01/15/2015 0547   CALCIUM 8.7* 01/15/2015 0547   PROT 7.1 01/14/2015 0617   ALBUMIN 2.6* 01/14/2015 0617   AST 18 01/14/2015 0617   ALT 16 01/14/2015 0617   ALKPHOS 113 01/14/2015 0617   BILITOT 0.6 01/14/2015 0617   GFRNONAA 55* 01/15/2015 0547   GFRAA >60 01/15/2015 0547   No results found for: CHOL, HDL, LDLCALC, LDLDIRECT, TRIG, CHOLHDL Lab Results  Component Value Date   HGBA1C 10.3* 01/14/2015   No results found for: TAVWPVXY80 Lab Results  Component Value Date   TSH 0.307* 01/14/2015    01/13/15 CT cervical  1. No evidence of acute osseous abnormality in the cervical spine. 2. Mild lower cervical disc degeneration. Arthropathic changes about the dens.  01/13/15 lumbar xray - No acute fracture or listhesis identified in the lumbar spine.  01/13/15 hip/pelvis xray - No acute abnormality  noted.  01/13/15 MRI thoracic [I reviewed images myself and agree with interpretation. -VRP]  - No acute fracture nor malalignment.  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 - Equivocal findings for cervical spinal cord edema/myelomalacia, this could be artifact though, given symptoms of RIGHT arm paresthesias, MRI of the cervical spine should be considered. - 3 cm LEFT thyroid nodule for which follow up thyroid sonogram is recommended on a nonemergent basis.  01/13/15 MRI lumbar spine [I reviewed images myself and agree with interpretation. -VRP]  1. Multilevel acquired and congenital spinal stenosis as described. 2. Moderate facet hypertrophy at L2-3 without significant focal protrusion or stenosis. 3. Mild foraminal narrowing bilaterally at L3-4 secondary to facet hypertrophy and short pedicles. 4. Mild subarticular and foraminal narrowing bilaterally at L4-5. 5. Find moderate left and mild right subarticular stenosis at L5-S1. 6. Mild foraminal narrowing bilaterally at L5-S1 is worse on the left.  01/14/15 MRI cervical spine [I reviewed images myself and agree with interpretation. -VRP]  - Straightened cervical lordosis without acute fracture nor malalignment. - Motion degraded examination. Multilevel disc bulges/protrusions,resulting in moderate RIGHT C6-7 neural foraminal narrowing associated with enlarged, edematous exiting RIGHT C7 nerve concerning for neuritis. - Mild canal stenosis C3-4, C4-5 and C6-7. - 3 cm LEFT thyroid nodule for which follow up thyroid sonogram is recommended on a nonemergent basis.     ASSESSMENT AND PLAN  54 y.o. year old female here with slip and fall from front steps in and of April 2016, with resultant low back pain and right arm numbness. Patient was admitted for evaluation. I reviewed imaging myself. Patient has multilevel degenerative  changes, diabetic neuropathy, with superimposed right cervical and lumbar radicular pain symptoms related to trauma. I advised patient to continue with conservative management with pain medications, physical therapy, stretching and strengthening exercises, anti-inflammatory's over-the-counter ibuprofen or Aleve.   Due to patient's lack of insurance and financial constraints, I advised her to follow-up with PCP and discuss options of increasing gabapentin as well as adding cyclobenzaprine as needed. She may follow-up with me as needed. Patient understands and agrees with plan.   Dx:   Bilateral low back pain  without sciatica  Cervical radiculitis  Diabetic polyneuropathy associated with type 2 diabetes mellitus    PLAN: - increase gabapentin to 346m three times per day - try cyclobenzaprine 166mTID prn spasm  Return for return to PCP.    VIPenni BombardMD 5/0/34/[id number]:63M Certified in Neurology, Neurophysiology and Neuroimaging  [facility] [address]Su[place] [phone]

## 2015-02-10 ENCOUNTER — Telehealth: Payer: Self-pay | Admitting: *Deleted

## 2015-02-10 NOTE — Telephone Encounter (Signed)
Left message for patient to call the office, patient requesting a copy of her medical records.

## 2015-03-24 ENCOUNTER — Other Ambulatory Visit: Payer: No Typology Code available for payment source

## 2015-03-25 ENCOUNTER — Ambulatory Visit
Admission: RE | Admit: 2015-03-25 | Discharge: 2015-03-25 | Disposition: A | Discharge status: Home / Self Care | Payer: No Typology Code available for payment source | Source: Ambulatory Visit | Attending: Otolaryngology | Admitting: Otolaryngology

## 2015-03-25 ENCOUNTER — Other Ambulatory Visit: Payer: Self-pay | Admitting: Otolaryngology

## 2015-03-25 DIAGNOSIS — D34 Benign neoplasm of thyroid gland: Secondary | ICD-10-CM

## 2015-03-31 ENCOUNTER — Ambulatory Visit
Admission: RE | Admit: 2015-03-31 | Discharge: 2015-03-31 | Disposition: A | Discharge status: Home / Self Care | Payer: No Typology Code available for payment source | Source: Ambulatory Visit | Attending: Otolaryngology | Admitting: Otolaryngology

## 2015-03-31 ENCOUNTER — Other Ambulatory Visit (HOSPITAL_COMMUNITY)
Admission: RE | Admit: 2015-03-31 | Discharge: 2015-03-31 | Disposition: A | Discharge status: Home / Self Care | Payer: No Typology Code available for payment source | Source: Ambulatory Visit | Attending: Interventional Radiology | Admitting: Interventional Radiology

## 2015-03-31 DIAGNOSIS — E041 Nontoxic single thyroid nodule: Secondary | ICD-10-CM | POA: Insufficient documentation

## 2015-03-31 DIAGNOSIS — D34 Benign neoplasm of thyroid gland: Secondary | ICD-10-CM

## 2015-08-16 ENCOUNTER — Ambulatory Visit: Payer: Self-pay | Attending: Podiatry

## 2015-08-31 ENCOUNTER — Telehealth (HOSPITAL_COMMUNITY): Payer: Self-pay | Admitting: *Deleted

## 2015-08-31 NOTE — Telephone Encounter (Signed)
Telephoned patient at home # and left message to return call to [facility] 

## 2015-09-12 DIAGNOSIS — I219 Acute myocardial infarction, unspecified: Secondary | ICD-10-CM

## 2015-09-12 HISTORY — DX: Acute myocardial infarction, unspecified: I21.9

## 2015-09-12 HISTORY — PX: PULMONARY EMBOLISM SURGERY: SHX752

## 2015-09-15 ENCOUNTER — Other Ambulatory Visit: Payer: Self-pay | Admitting: Obstetrics and Gynecology

## 2015-09-15 ENCOUNTER — Telehealth (HOSPITAL_COMMUNITY): Payer: Self-pay | Admitting: *Deleted

## 2015-09-15 DIAGNOSIS — Z1231 Encounter for screening mammogram for malignant neoplasm of breast: Secondary | ICD-10-CM

## 2015-09-15 NOTE — Telephone Encounter (Signed)
Telephoned patient at home # and left message on voicemail reminding patient of appointment with [facility] on Jan 5 10:30

## 2015-09-16 ENCOUNTER — Encounter (HOSPITAL_COMMUNITY): Payer: Self-pay

## 2015-09-16 ENCOUNTER — Ambulatory Visit
Admission: RE | Admit: 2015-09-16 | Discharge: 2015-09-16 | Disposition: A | Discharge status: Home / Self Care | Payer: No Typology Code available for payment source | Source: Ambulatory Visit | Attending: Obstetrics and Gynecology | Admitting: Obstetrics and Gynecology

## 2015-09-16 ENCOUNTER — Ambulatory Visit (HOSPITAL_COMMUNITY)
Admission: RE | Admit: 2015-09-16 | Discharge: 2015-09-16 | Disposition: A | Discharge status: Home / Self Care | Payer: Self-pay | Source: Ambulatory Visit | Attending: Obstetrics and Gynecology | Admitting: Obstetrics and Gynecology

## 2015-09-16 VITALS — BP 120/76 | Temp 98.0°F | Ht 64.0 in | Wt 240.0 lb

## 2015-09-16 DIAGNOSIS — Z1231 Encounter for screening mammogram for malignant neoplasm of breast: Secondary | ICD-10-CM

## 2015-09-16 DIAGNOSIS — Z1239 Encounter for other screening for malignant neoplasm of breast: Secondary | ICD-10-CM

## 2015-09-16 NOTE — Progress Notes (Addendum)
No complaints today.  Pap Smear:  Pap smear not completed today. Last Pap smear was in December 2016 at [facility] and normal per patient. Per patient has no history of an abnormal Pap smear. No Pap smear results in EPIC. Physical exam: Breasts Breasts symmetrical. No skin abnormalities bilateral breasts. No nipple retraction bilateral breasts. No nipple discharge bilateral breasts. No lymphadenopathy. No lumps palpated bilateral breasts. No complaints of pain or tenderness on exam. Referred patient to the [facility] for a screening mammogram. Appointment scheduled for Thursday, September 15, 2014 at 1320.        Pelvic/Bimanual No Pap smear completed today since last Pap smear was in December 2016 per patient. Pap smear not indicated per [facility] guidelines.   Smoking History: Patient has never smoked.  Patient Navigation: Patient education provided. Access to services provided for patient through [facility] program. Patient given taxi voucher to get to appointment at the [facility] and a bus pass to get home from the [employer].  Colorectal Screening: Patient stated she had a colonoscopy 1 year ago.

## 2015-09-16 NOTE — Addendum Note (Signed)
Encounter addended by: Loletta Parish, RN on: 09/16/2015  2:23 PM<BR>     Documentation filed: ED Follow-Up, Patient Instructions Section, Notes Section

## 2015-09-16 NOTE — Patient Instructions (Addendum)
Educational materials on self breast awareness given. Explained to Tamara Henry that she did not need a Pap smear today due to last Pap smear was in December 2016 per patient. Let her know [facility] will cover Pap smears every 3 years unless has a history of abnormal Pap smears. Referred patient to the [facility] for a screening mammogram. Appointment scheduled for Thursday, September 15, 2014 at 1320. Patient aware of appointment and will be there. Let [facility] know [facility] will follow up with her within the next couple weeks with results by letter or phone. Tamara Henry verbalized understanding.  Tamara Henry, Arvil Chaco, RN 2:16 PM

## 2015-09-17 ENCOUNTER — Other Ambulatory Visit: Payer: Self-pay | Admitting: Obstetrics and Gynecology

## 2015-09-17 DIAGNOSIS — R928 Other abnormal and inconclusive findings on diagnostic imaging of breast: Secondary | ICD-10-CM

## 2015-09-23 ENCOUNTER — Ambulatory Visit
Admission: RE | Admit: 2015-09-23 | Discharge: 2015-09-23 | Disposition: A | Discharge status: Home / Self Care | Payer: No Typology Code available for payment source | Source: Ambulatory Visit | Attending: Obstetrics and Gynecology | Admitting: Obstetrics and Gynecology

## 2015-09-23 DIAGNOSIS — R928 Other abnormal and inconclusive findings on diagnostic imaging of breast: Secondary | ICD-10-CM

## 2015-12-13 ENCOUNTER — Ambulatory Visit: Payer: Self-pay

## 2016-02-23 ENCOUNTER — Encounter (HOSPITAL_COMMUNITY): Payer: Self-pay

## 2016-02-23 ENCOUNTER — Emergency Department (HOSPITAL_COMMUNITY)
Admission: EM | Admit: 2016-02-23 | Discharge: 2016-02-24 | Disposition: A | Discharge status: Home / Self Care | Payer: Medicare Other | Attending: Emergency Medicine | Admitting: Emergency Medicine

## 2016-02-23 DIAGNOSIS — Z7982 Long term (current) use of aspirin: Secondary | ICD-10-CM | POA: Insufficient documentation

## 2016-02-23 DIAGNOSIS — E119 Type 2 diabetes mellitus without complications: Secondary | ICD-10-CM | POA: Insufficient documentation

## 2016-02-23 DIAGNOSIS — M5416 Radiculopathy, lumbar region: Secondary | ICD-10-CM | POA: Diagnosis not present

## 2016-02-23 DIAGNOSIS — Z87891 Personal history of nicotine dependence: Secondary | ICD-10-CM | POA: Insufficient documentation

## 2016-02-23 DIAGNOSIS — Z7984 Long term (current) use of oral hypoglycemic drugs: Secondary | ICD-10-CM | POA: Diagnosis not present

## 2016-02-23 DIAGNOSIS — Z79899 Other long term (current) drug therapy: Secondary | ICD-10-CM | POA: Diagnosis not present

## 2016-02-23 DIAGNOSIS — M545 Low back pain: Secondary | ICD-10-CM | POA: Diagnosis not present

## 2016-02-23 DIAGNOSIS — M79605 Pain in left leg: Secondary | ICD-10-CM | POA: Diagnosis present

## 2016-02-23 DIAGNOSIS — I10 Essential (primary) hypertension: Secondary | ICD-10-CM | POA: Diagnosis not present

## 2016-02-23 NOTE — ED Notes (Addendum)
Patient states that has had pain in left leg for last x2 months.  Patient states that pain shoots from ankle up to her groin area.  Patient describes the pain as sharp and burning.  Patient states that has neuropathy and relates some of the pain to that but, pain is worse today.  Patient denies chest pain, denies SOB, denies N/V.  Denies trauma to the leg. Patient rates pain 10/10.

## 2016-02-24 ENCOUNTER — Emergency Department (HOSPITAL_COMMUNITY): Payer: Medicare Other

## 2016-02-24 DIAGNOSIS — M5416 Radiculopathy, lumbar region: Secondary | ICD-10-CM | POA: Diagnosis not present

## 2016-02-24 DIAGNOSIS — M545 Low back pain: Secondary | ICD-10-CM | POA: Diagnosis not present

## 2016-02-24 MED ORDER — HYDROCODONE-ACETAMINOPHEN 5-325 MG PO TABS: 1.0000 | ORAL_TABLET | ORAL | Status: DC | PRN

## 2016-02-24 MED ORDER — PREDNISONE 20 MG PO TABS
60.0000 mg | ORAL_TABLET | Freq: Once | ORAL | Status: AC
  Administered 2016-02-24: 60 mg via ORAL
  Filled 2016-02-24: qty 3

## 2016-02-24 MED ORDER — PREDNISONE 10 MG PO TABS: 20.0000 mg | ORAL_TABLET | Freq: Every day | ORAL | Status: DC

## 2016-02-24 MED ORDER — HYDROCODONE-ACETAMINOPHEN 5-325 MG PO TABS
1.0000 | ORAL_TABLET | Freq: Once | ORAL | Status: AC
  Administered 2016-02-24: 1 via ORAL
  Filled 2016-02-24: qty 1

## 2016-02-24 NOTE — ED Provider Notes (Signed)
CSN: [id number]     Arrival date & time 02/23/16  2108 History   First MD Initiated Contact with Patient 02/24/16 0104     Chief Complaint  Patient presents with  . Leg Pain     (Consider location/radiation/quality/duration/timing/severity/associated sxs/prior Treatment) HPI Comments:  morbidly obese African American female with presenting history of 2 months of left leg pain.  She states the pain goes from her toes to her groin.  It's tingling in nature and painful.  She has not done anything specifically to alleviate her discomfort.  She states she is between physicians right now and has sats in the mid specifically for her leg pain.  She does have a history of sciatica, hypertension, high cholesterol, diabetic neuropathy, diabetes, depression, GERD, constipation.  Patient is a 55 y.o. female presenting with leg pain. The history is provided by the patient.  Leg Pain Location:  Leg Injury: no   Leg location:  L leg and L upper leg Pain details:    Quality:  Aching   Radiates to:  Groin   Severity:  Moderate   Onset quality:  Gradual   Timing:  Constant   Progression:  Worsening Chronicity:  Recurrent Dislocation: no   Foreign body present:  No foreign bodies Prior injury to area:  No Relieved by:  Nothing Worsened by:  Nothing tried Ineffective treatments:  None tried Associated symptoms: numbness   Associated symptoms: no fatigue, no fever and no muscle weakness     Past Medical History  Diagnosis Date  . Hypertension   . High cholesterol   . Diabetes mellitus without complication ([place])   . Neuropathy ([place])   . Acid reflux   . Pneumonia    Past Surgical History  Procedure Laterality Date  . Tubal ligation     Family History  Problem Relation Age of Onset  . Pancreatitis Mother   . Diabetes type II Mother   . CAD Father   . Diabetes type II Father   . Diabetes type II Sister   . Diabetes type II Brother    Social History  Substance Use Topics  . Smoking  status: Former Smoker    Quit date: 09/11/2005  . Smokeless tobacco: Never Used  . Alcohol Use: No   OB History    Gravida Para Term Preterm AB TAB SAB Ectopic Multiple Living   5 4 4  1 1    4      Review of Systems  Constitutional: Negative for fever and fatigue.  Respiratory: Negative for shortness of breath.   Cardiovascular: Negative for chest pain and leg swelling.  Musculoskeletal: Positive for arthralgias.  Skin: Negative for wound.  All other systems reviewed and are negative.     Allergies  Lisinopril; Metformin and related; and Sulfa antibiotics  Home Medications   Prior to Admission medications   Medication Sig Start Date End Date Taking? Authorizing Provider  amLODipine (NORVASC) 5 MG tablet Take 5 mg by mouth daily. Reported on 09/16/2015    Historical Provider, MD  aspirin 325 MG tablet Take 325 mg by mouth daily. Reported on 09/16/2015    Historical Provider, MD  docusate sodium (COLACE) 100 MG capsule Take 1 capsule (100 mg total) by mouth 2 (two) times daily. Patient not taking: Reported on 09/16/2015 01/15/15   Debbe Odea, MD  Ezetimibe-Simvastatin (VYTORIN PO) Take 1 tablet by mouth daily. Reported on 09/16/2015    Historical Provider, MD  gabapentin (NEURONTIN) 300 MG capsule Take 300 mg by  mouth 2 (two) times daily. Reported on 09/16/2015    Historical Provider, MD  glipiZIDE (GLUCOTROL) 10 MG tablet Take 10 mg by mouth 2 (two) times daily before a meal. Reported on 09/16/2015    Historical Provider, MD  glucose monitoring kit (FREESTYLE) monitoring kit 1 each by Does not apply route 4 (four) times daily - after meals and at bedtime. 1 month Diabetic Testing Supplies for QAC-QHS accuchecks. Patient not taking: Reported on 09/16/2015 01/15/15   Debbe Odea, MD  guaiFENesin (MUCINEX) 600 MG 12 hr tablet Take 2 tablets (1,200 mg total) by mouth 2 (two) times daily. Patient not taking: Reported on 09/16/2015 04/24/13   Linton Flemings, MD  HYDROcodone-acetaminophen (NORCO/VICODIN)  5-325 MG tablet Take 1-2 tablets by mouth every 4 (four) hours as needed for moderate pain. 02/24/16   Junius Creamer, NP  MAGNESIUM PO Take 1 tablet by mouth daily. Reported on 09/16/2015    Historical Provider, MD  Melatonin 3 MG CAPS Take 3 mg by mouth at bedtime. Reported on 09/16/2015    Historical Provider, MD  Multiple Vitamins-Calcium (ONE-A-DAY WOMENS PO) Take 1 tablet by mouth daily. Reported on 09/16/2015    Historical Provider, MD  ofloxacin (OCUFLOX) 0.3 % ophthalmic solution Place 1 drop into the left eye 4 (four) times daily. Reported on 09/16/2015    Historical Provider, MD  omeprazole (PRILOSEC) 20 MG capsule Take 20 mg by mouth daily. Reported on 09/16/2015 10/26/12   Fredia Sorrow, MD  ondansetron (ZOFRAN) 4 MG tablet Take 1 tablet (4 mg total) by mouth every 6 (six) hours. Patient not taking: Reported on 09/16/2015 06/20/13   Orpah Greek, MD  PARoxetine (PAXIL) 20 MG tablet Take 20 mg by mouth every morning. Reported on 09/16/2015    Historical Provider, MD  predniSONE (DELTASONE) 10 MG tablet Take 2 tablets (20 mg total) by mouth daily. 02/24/16   Junius Creamer, NP  senna (SENOKOT) 8.6 MG TABS tablet Take 1 tablet (8.6 mg total) by mouth daily as needed for mild constipation. Patient not taking: Reported on 09/16/2015 01/15/15   Debbe Odea, MD  SitaGLIPtin Phosphate (JANUVIA PO) Take 1 tablet by mouth daily. Reported on 09/16/2015    Historical Provider, MD   BP 140/83 mmHg  Pulse 91  Temp(Src) 98.4 F (36.9 C) (Oral)  Resp 16  SpO2 93%  LMP 02/10/2016 (Exact Date) Physical Exam  Constitutional: She appears well-developed and well-nourished.  HENT:  Head: Normocephalic.  Eyes: Pupils are equal, round, and reactive to light.  Neck: Normal range of motion.  Cardiovascular: Normal rate and regular rhythm.   Pulmonary/Chest: Effort normal and breath sounds normal.  Musculoskeletal: Normal range of motion. She exhibits no edema or tenderness.       Left hip: She exhibits normal range  of motion, normal strength, no swelling and no deformity.       Legs: Neurological: She is alert.  Skin: Skin is warm.  Nursing note and vitals reviewed.   ED Course  Procedures (including critical care time) Labs Review Labs Reviewed - No data to display  Imaging Review Dg Lumbar Spine Complete  02/24/2016  CLINICAL DATA:  Low back pain radiating down the legs for years. No known injury. Pain is worse today. Neuropathy. EXAM: LUMBAR SPINE - COMPLETE 4+ VIEW COMPARISON:  MRI lumbar spine 01/13/2015.  Lumbar spine 01/13/2015. FINDINGS: Normal alignment of the lumbar spine. Mild endplate hypertrophic changes demonstrated at multiple levels. No vertebral compression deformities. Intervertebral disc space heights are preserved. No focal  bone lesion or bone destruction. Appearances are similar to previous study. IMPRESSION: Normal alignment. Mild degenerative changes. No acute displaced fractures are demonstrated. Electronically Signed   By: Lucienne Capers M.D.   On: 02/24/2016 02:05   I have personally reviewed and evaluated these images and lab results as part of my medical decision-making.   EKG Interpretation None      MDM   Final diagnoses:  Radiculopathy of lumbar region         Junius Creamer, NP 28/24/17 5301  Delora Fuel, MD 12/13/57 1368

## 2016-02-24 NOTE — Discharge Instructions (Signed)
Your physical examination and x-ray of your lower spine to the diagnosis of lumbar radiculopathy.  He been given a prescription for pain control as well as pericolic anti-inflammatory in the form of prednisone.  Please take this as directed until, completed please make an appointment with your primary care physician for further evaluation and treatment

## 2016-03-10 DIAGNOSIS — F330 Major depressive disorder, recurrent, mild: Secondary | ICD-10-CM | POA: Diagnosis not present

## 2016-05-12 DIAGNOSIS — I2699 Other pulmonary embolism without acute cor pulmonale: Secondary | ICD-10-CM

## 2016-05-12 HISTORY — DX: Other pulmonary embolism without acute cor pulmonale: I26.99

## 2016-05-17 ENCOUNTER — Inpatient Hospital Stay (HOSPITAL_COMMUNITY)
Admission: EM | Admit: 2016-05-17 | Discharge: 2016-05-20 | DRG: 176 | Disposition: A | Discharge status: Home Health | Payer: Medicare Other | Attending: Internal Medicine | Admitting: Internal Medicine

## 2016-05-17 ENCOUNTER — Emergency Department (HOSPITAL_COMMUNITY): Payer: Medicare Other

## 2016-05-17 ENCOUNTER — Encounter (HOSPITAL_COMMUNITY): Payer: Self-pay | Admitting: Emergency Medicine

## 2016-05-17 DIAGNOSIS — S01511A Laceration without foreign body of lip, initial encounter: Secondary | ICD-10-CM | POA: Diagnosis present

## 2016-05-17 DIAGNOSIS — Z7984 Long term (current) use of oral hypoglycemic drugs: Secondary | ICD-10-CM | POA: Diagnosis not present

## 2016-05-17 DIAGNOSIS — R55 Syncope and collapse: Secondary | ICD-10-CM

## 2016-05-17 DIAGNOSIS — I10 Essential (primary) hypertension: Secondary | ICD-10-CM | POA: Diagnosis not present

## 2016-05-17 DIAGNOSIS — E1122 Type 2 diabetes mellitus with diabetic chronic kidney disease: Secondary | ICD-10-CM | POA: Diagnosis not present

## 2016-05-17 DIAGNOSIS — I2699 Other pulmonary embolism without acute cor pulmonale: Secondary | ICD-10-CM | POA: Diagnosis not present

## 2016-05-17 DIAGNOSIS — D72829 Elevated white blood cell count, unspecified: Secondary | ICD-10-CM | POA: Diagnosis present

## 2016-05-17 DIAGNOSIS — R197 Diarrhea, unspecified: Secondary | ICD-10-CM | POA: Diagnosis present

## 2016-05-17 DIAGNOSIS — Z8249 Family history of ischemic heart disease and other diseases of the circulatory system: Secondary | ICD-10-CM

## 2016-05-17 DIAGNOSIS — E119 Type 2 diabetes mellitus without complications: Secondary | ICD-10-CM

## 2016-05-17 DIAGNOSIS — R404 Transient alteration of awareness: Secondary | ICD-10-CM | POA: Diagnosis not present

## 2016-05-17 DIAGNOSIS — I129 Hypertensive chronic kidney disease with stage 1 through stage 4 chronic kidney disease, or unspecified chronic kidney disease: Secondary | ICD-10-CM | POA: Diagnosis not present

## 2016-05-17 DIAGNOSIS — Z6841 Body Mass Index (BMI) 40.0 and over, adult: Secondary | ICD-10-CM | POA: Diagnosis not present

## 2016-05-17 DIAGNOSIS — Z87891 Personal history of nicotine dependence: Secondary | ICD-10-CM | POA: Diagnosis not present

## 2016-05-17 DIAGNOSIS — Z79899 Other long term (current) drug therapy: Secondary | ICD-10-CM | POA: Diagnosis not present

## 2016-05-17 DIAGNOSIS — Z6839 Body mass index (BMI) 39.0-39.9, adult: Secondary | ICD-10-CM

## 2016-05-17 DIAGNOSIS — N179 Acute kidney failure, unspecified: Secondary | ICD-10-CM | POA: Diagnosis not present

## 2016-05-17 DIAGNOSIS — E785 Hyperlipidemia, unspecified: Secondary | ICD-10-CM | POA: Diagnosis present

## 2016-05-17 DIAGNOSIS — K219 Gastro-esophageal reflux disease without esophagitis: Secondary | ICD-10-CM | POA: Diagnosis not present

## 2016-05-17 DIAGNOSIS — N182 Chronic kidney disease, stage 2 (mild): Secondary | ICD-10-CM | POA: Diagnosis present

## 2016-05-17 DIAGNOSIS — Z794 Long term (current) use of insulin: Secondary | ICD-10-CM | POA: Diagnosis not present

## 2016-05-17 DIAGNOSIS — G629 Polyneuropathy, unspecified: Secondary | ICD-10-CM | POA: Diagnosis present

## 2016-05-17 DIAGNOSIS — F329 Major depressive disorder, single episode, unspecified: Secondary | ICD-10-CM | POA: Diagnosis present

## 2016-05-17 DIAGNOSIS — R7989 Other specified abnormal findings of blood chemistry: Secondary | ICD-10-CM | POA: Diagnosis not present

## 2016-05-17 DIAGNOSIS — S02401A Maxillary fracture, unspecified, initial encounter for closed fracture: Secondary | ICD-10-CM

## 2016-05-17 DIAGNOSIS — E1142 Type 2 diabetes mellitus with diabetic polyneuropathy: Secondary | ICD-10-CM | POA: Diagnosis present

## 2016-05-17 DIAGNOSIS — E11649 Type 2 diabetes mellitus with hypoglycemia without coma: Secondary | ICD-10-CM | POA: Diagnosis present

## 2016-05-17 DIAGNOSIS — S0242XA Fracture of alveolus of maxilla, initial encounter for closed fracture: Secondary | ICD-10-CM | POA: Diagnosis present

## 2016-05-17 DIAGNOSIS — S0990XA Unspecified injury of head, initial encounter: Secondary | ICD-10-CM | POA: Diagnosis not present

## 2016-05-17 DIAGNOSIS — R079 Chest pain, unspecified: Secondary | ICD-10-CM | POA: Diagnosis not present

## 2016-05-17 DIAGNOSIS — E7800 Pure hypercholesterolemia, unspecified: Secondary | ICD-10-CM | POA: Diagnosis present

## 2016-05-17 DIAGNOSIS — E869 Volume depletion, unspecified: Secondary | ICD-10-CM | POA: Diagnosis present

## 2016-05-17 DIAGNOSIS — K1370 Unspecified lesions of oral mucosa: Secondary | ICD-10-CM | POA: Diagnosis not present

## 2016-05-17 DIAGNOSIS — E860 Dehydration: Secondary | ICD-10-CM | POA: Diagnosis present

## 2016-05-17 DIAGNOSIS — F32A Depression, unspecified: Secondary | ICD-10-CM | POA: Diagnosis present

## 2016-05-17 DIAGNOSIS — Z833 Family history of diabetes mellitus: Secondary | ICD-10-CM

## 2016-05-17 HISTORY — DX: Other specified postprocedural states: Z98.890

## 2016-05-17 LAB — CBC WITH DIFFERENTIAL/PLATELET
BASOS ABS: 0 10*3/uL (ref 0.0–0.1)
Basophils Relative: 0 %
EOS PCT: 1 %
Eosinophils Absolute: 0.1 10*3/uL (ref 0.0–0.7)
HCT: 44.5 % (ref 36.0–46.0)
Hemoglobin: 14.6 g/dL (ref 12.0–15.0)
Lymphocytes Relative: 19 %
Lymphs Abs: 2 10*3/uL (ref 0.7–4.0)
MCH: 29.6 pg (ref 26.0–34.0)
MCHC: 32.8 g/dL (ref 30.0–36.0)
MCV: 90.1 fL (ref 78.0–100.0)
MONO ABS: 0.5 10*3/uL (ref 0.1–1.0)
MONOS PCT: 5 %
Neutro Abs: 8 10*3/uL — ABNORMAL HIGH (ref 1.7–7.7)
Neutrophils Relative %: 75 %
PLATELETS: 272 10*3/uL (ref 150–400)
RBC: 4.94 MIL/uL (ref 3.87–5.11)
RDW: 13.9 % (ref 11.5–15.5)
WBC: 10.6 10*3/uL — ABNORMAL HIGH (ref 4.0–10.5)

## 2016-05-17 LAB — URINE MICROSCOPIC-ADD ON

## 2016-05-17 LAB — URINALYSIS, ROUTINE W REFLEX MICROSCOPIC
Bilirubin Urine: NEGATIVE
GLUCOSE, UA: NEGATIVE mg/dL
Ketones, ur: NEGATIVE mg/dL
LEUKOCYTES UA: NEGATIVE
Nitrite: NEGATIVE
PH: 6 (ref 5.0–8.0)
Protein, ur: >300 mg/dL — AB
Specific Gravity, Urine: 1.015 (ref 1.005–1.030)

## 2016-05-17 LAB — COMPREHENSIVE METABOLIC PANEL
ALBUMIN: 3.3 g/dL — AB (ref 3.5–5.0)
ALK PHOS: 110 U/L (ref 38–126)
ALT: 21 U/L (ref 14–54)
AST: 23 U/L (ref 15–41)
Anion gap: 9 (ref 5–15)
BILIRUBIN TOTAL: 0.5 mg/dL (ref 0.3–1.2)
BUN: 20 mg/dL (ref 6–20)
CALCIUM: 9 mg/dL (ref 8.9–10.3)
CO2: 23 mmol/L (ref 22–32)
Chloride: 105 mmol/L (ref 101–111)
Creatinine, Ser: 1.32 mg/dL — ABNORMAL HIGH (ref 0.44–1.00)
GFR calc Af Amer: 53 mL/min — ABNORMAL LOW (ref >60)
GFR calc non Af Amer: 45 mL/min — ABNORMAL LOW (ref >60)
GLUCOSE: 152 mg/dL — AB (ref 65–99)
Potassium: 4.2 mmol/L (ref 3.5–5.1)
Sodium: 137 mmol/L (ref 135–145)
Total Protein: 8.2 g/dL — ABNORMAL HIGH (ref 6.5–8.1)

## 2016-05-17 LAB — POC URINE PREG, ED: PREG TEST UR: NEGATIVE

## 2016-05-17 LAB — I-STAT TROPONIN, ED: Troponin i, poc: 0 ng/mL (ref 0.00–0.08)

## 2016-05-17 LAB — APTT: aPTT: 24 seconds (ref 24–36)

## 2016-05-17 LAB — PROTIME-INR
INR: 1
PROTHROMBIN TIME: 13.2 s (ref 11.4–15.2)

## 2016-05-17 LAB — D-DIMER, QUANTITATIVE: D-Dimer, Quant: 2.18 ug/mL-FEU — ABNORMAL HIGH (ref 0.00–0.50)

## 2016-05-17 MED ORDER — IOPAMIDOL (ISOVUE-370) INJECTION 76%
100.0000 mL | Freq: Once | INTRAVENOUS | Status: AC | PRN
  Administered 2016-05-17: 100 mL via INTRAVENOUS

## 2016-05-17 MED ORDER — SODIUM CHLORIDE 0.9 % IV SOLN
INTRAVENOUS | Status: DC
  Administered 2016-05-18: via INTRAVENOUS

## 2016-05-17 MED ORDER — EZETIMIBE-SIMVASTATIN 10-10 MG PO TABS: 1.0000 | ORAL_TABLET | Freq: Every day | ORAL | Status: DC

## 2016-05-17 MED ORDER — PAROXETINE HCL 20 MG PO TABS
40.0000 mg | ORAL_TABLET | Freq: Every day | ORAL | Status: DC
  Administered 2016-05-18 – 2016-05-20 (×3): 40 mg via ORAL
  Filled 2016-05-17 (×3): qty 2

## 2016-05-17 MED ORDER — VITAMIN D3 25 MCG (1000 UNIT) PO TABS
1000.0000 [IU] | ORAL_TABLET | Freq: Two times a day (BID) | ORAL | Status: DC
  Administered 2016-05-18 – 2016-05-20 (×6): 1000 [IU] via ORAL
  Filled 2016-05-17 (×6): qty 1

## 2016-05-17 MED ORDER — AMLODIPINE BESYLATE 5 MG PO TABS
5.0000 mg | ORAL_TABLET | Freq: Every day | ORAL | Status: DC
  Administered 2016-05-18 – 2016-05-20 (×3): 5 mg via ORAL
  Filled 2016-05-17 (×3): qty 1

## 2016-05-17 MED ORDER — METHOCARBAMOL 500 MG PO TABS
1500.0000 mg | ORAL_TABLET | Freq: Two times a day (BID) | ORAL | Status: DC | PRN
  Administered 2016-05-18: 1500 mg via ORAL
  Filled 2016-05-17 (×2): qty 3

## 2016-05-17 MED ORDER — MORPHINE SULFATE (PF) 4 MG/ML IV SOLN
4.0000 mg | Freq: Once | INTRAVENOUS | Status: AC
  Administered 2016-05-17: 4 mg via INTRAVENOUS
  Filled 2016-05-17: qty 1

## 2016-05-17 MED ORDER — INSULIN GLARGINE 100 UNIT/ML ~~LOC~~ SOLN
20.0000 [IU] | Freq: Every day | SUBCUTANEOUS | Status: DC
  Administered 2016-05-18: 20 [IU] via SUBCUTANEOUS
  Filled 2016-05-17: qty 0.2

## 2016-05-17 MED ORDER — SODIUM CHLORIDE 0.9% FLUSH
3.0000 mL | Freq: Two times a day (BID) | INTRAVENOUS | Status: DC
  Administered 2016-05-18 – 2016-05-19 (×3): 3 mL via INTRAVENOUS

## 2016-05-17 MED ORDER — KETOROLAC TROMETHAMINE 30 MG/ML IJ SOLN
30.0000 mg | Freq: Once | INTRAMUSCULAR | Status: AC
  Administered 2016-05-18: 30 mg via INTRAVENOUS
  Filled 2016-05-17: qty 1

## 2016-05-17 MED ORDER — LIDOCAINE HCL (PF) 1 % IJ SOLN
5.0000 mL | Freq: Once | INTRAMUSCULAR | Status: AC
  Administered 2016-05-17: 5 mL
  Filled 2016-05-17: qty 30

## 2016-05-17 MED ORDER — INSULIN GLARGINE 100 UNIT/ML ~~LOC~~ SOLN
65.0000 [IU] | Freq: Every day | SUBCUTANEOUS | Status: DC
Start: 2016-05-18 — End: 2016-05-18
  Filled 2016-05-17 (×2): qty 0.65

## 2016-05-17 MED ORDER — PANTOPRAZOLE SODIUM 40 MG PO TBEC
40.0000 mg | DELAYED_RELEASE_TABLET | Freq: Every day | ORAL | Status: DC
  Administered 2016-05-18 – 2016-05-20 (×3): 40 mg via ORAL
  Filled 2016-05-17 (×3): qty 1

## 2016-05-17 MED ORDER — HEPARIN (PORCINE) IN NACL 100-0.45 UNIT/ML-% IJ SOLN
1450.0000 [IU]/h | INTRAMUSCULAR | Status: DC
  Administered 2016-05-18: 1450 [IU]/h via INTRAVENOUS
  Filled 2016-05-17: qty 250

## 2016-05-17 MED ORDER — HEPARIN BOLUS VIA INFUSION
2500.0000 [IU] | Freq: Once | INTRAVENOUS | Status: AC
  Administered 2016-05-17: 2500 [IU] via INTRAVENOUS
  Filled 2016-05-17: qty 2500

## 2016-05-17 MED ORDER — GABAPENTIN 400 MG PO CAPS
400.0000 mg | ORAL_CAPSULE | Freq: Three times a day (TID) | ORAL | Status: DC
  Administered 2016-05-18 – 2016-05-20 (×8): 400 mg via ORAL
  Filled 2016-05-17 (×8): qty 1

## 2016-05-17 MED ORDER — SODIUM CHLORIDE 0.9% FLUSH
3.0000 mL | Freq: Two times a day (BID) | INTRAVENOUS | Status: DC
  Administered 2016-05-18 – 2016-05-19 (×5): 3 mL via INTRAVENOUS

## 2016-05-17 MED ORDER — GLIPIZIDE 10 MG PO TABS
10.0000 mg | ORAL_TABLET | Freq: Two times a day (BID) | ORAL | Status: DC
  Administered 2016-05-18: 10 mg via ORAL
  Filled 2016-05-17: qty 1

## 2016-05-17 MED ORDER — METOPROLOL TARTRATE 25 MG PO TABS
25.0000 mg | ORAL_TABLET | Freq: Two times a day (BID) | ORAL | Status: DC
  Administered 2016-05-18 – 2016-05-20 (×6): 25 mg via ORAL
  Filled 2016-05-17 (×6): qty 1

## 2016-05-17 MED ORDER — LINAGLIPTIN 5 MG PO TABS
5.0000 mg | ORAL_TABLET | Freq: Every day | ORAL | Status: DC
  Administered 2016-05-18: 5 mg via ORAL
  Filled 2016-05-17: qty 1

## 2016-05-17 MED ORDER — SIMVASTATIN 10 MG PO TABS
10.0000 mg | ORAL_TABLET | Freq: Every day | ORAL | Status: DC
  Administered 2016-05-18 – 2016-05-19 (×2): 10 mg via ORAL
  Filled 2016-05-17 (×2): qty 1

## 2016-05-17 MED ORDER — HYDROCHLOROTHIAZIDE 12.5 MG PO CAPS
12.5000 mg | ORAL_CAPSULE | Freq: Every day | ORAL | Status: DC
  Administered 2016-05-18 – 2016-05-20 (×3): 12.5 mg via ORAL
  Filled 2016-05-17 (×3): qty 1

## 2016-05-17 MED ORDER — EZETIMIBE 10 MG PO TABS
10.0000 mg | ORAL_TABLET | Freq: Every day | ORAL | Status: DC
  Administered 2016-05-18 – 2016-05-19 (×2): 10 mg via ORAL
  Filled 2016-05-17 (×2): qty 1

## 2016-05-17 MED ORDER — OXYCODONE-ACETAMINOPHEN 5-325 MG PO TABS
1.0000 | ORAL_TABLET | Freq: Once | ORAL | Status: DC
  Filled 2016-05-17: qty 1

## 2016-05-17 MED ORDER — ONDANSETRON HCL 4 MG/2ML IJ SOLN
4.0000 mg | Freq: Once | INTRAMUSCULAR | Status: AC
  Administered 2016-05-17: 4 mg via INTRAVENOUS
  Filled 2016-05-17: qty 2

## 2016-05-17 NOTE — ED Notes (Signed)
Bed: [id number] Expected date:  Expected time:  Means of arrival:  Comments: EMS- 55yo F, syncope in shower/mouth pain

## 2016-05-17 NOTE — Progress Notes (Addendum)
ANTICOAGULATION CONSULT NOTE - Initial Consult  Pharmacy Consult for IV heparin Indication: pulmonary embolus  Allergies  Allergen Reactions  . Lisinopril Other (See Comments)    Inflammation, coughing  . Metformin And Related Diarrhea  . Reglan [Metoclopramide] Other (See Comments)    Pt stated having lock jaw as a side effect.   . Sulfa Antibiotics Itching  . Wellbutrin [Bupropion] Cough    Patient Measurements: Height: 5\' 4"  (162.6 cm) Weight: 240 lb (108.9 kg) IBW/kg (Calculated) : 54.7 Heparin Dosing Weight: 80.5 kg (used [place] nomogram)  Vital Signs: Temp: 99 F (37.2 C) (09/06 1930) Temp Source: Oral (09/06 1930) BP: 151/89 (09/06 1930) Pulse Rate: 77 (09/06 1930)  Labs:  Recent Labs  05/17/16 2031  HGB 14.6  HCT 44.5  PLT 272  CREATININE 1.32*    Estimated Creatinine Clearance: 60.1 mL/min (by C-G formula based on SCr of 1.32 mg/dL).   Medical History: Past Medical History:  Diagnosis Date  . Acid reflux   . Diabetes mellitus without complication ([place])   . High cholesterol   . Hypertension   . Neuropathy ([place])   . Pneumonia     Medications:   (Not in a hospital admission) Scheduled:  . heparin  2,500 Units Intravenous Once  . lidocaine (PF)  5 mL Infiltration Once    Assessment: 11 yoF with Hx HTN, DM presents 05/17/16 with syncope; endorses recent SOB with exertion. D-dimer elevated; CT angio shows bilateral LL segmental/subsegmental PE w/o R heart strain.  Pharmacy consulted to start IV heparin.   Baseline INR, aPTT: pending  Prior anticoagulation: none, on ASA 81 PTA  Significant events:  Today, 05/17/2016:  CBC: wnl  No bleeding or infusion issues per nursing; note patient did have bleeding around gums/teeth earlier today after striking jaw during fall, but this appears to be well controlled  CrCl: 60 ml/min  Goal of Therapy: Heparin level 0.3-0.7 units/ml Monitor platelets by anticoagulation protocol: Yes  Plan:    Delay heparin start until after anticoag labs drawn  Heparin 2500 units IV bolus x 1  Heparin 1450 units/hr IV infusion  Check heparin level 6 hrs after start  Daily CBC, daily heparin level once stable  Monitor for signs of bleeding or thrombosis   Reuel Boom, PharmD Pager: [phone] 05/17/2016, 10:20 PM

## 2016-05-17 NOTE — ED Triage Notes (Signed)
Per EMS pt from home, had episode of diarrhea then got into shower, felt hot then woke up lying against toilet. Right side mouth (gums) bleeding and painful, denies neck or back pain. Denies nausea or dizziness now. CBG of 123 with EMS pt states is low for her.

## 2016-05-17 NOTE — H&P (Signed)
History and Physical    Tamara Henry FXT:[id number] DOB: May 29, 1961 DOA: 05/17/2016  PCP: Elbert Ewings, FNP   Patient coming from: Home.  Chief Complaint: Syncope  HPI: Tamara Henry is a 55 y.o. female with medical history significant of GERD, type 2 diabetes, hyperlipidemia, hypertension, diabetic neuropathy, pneumonia who comes to the emergency department after loosing consciousness at home in her bathroom.  Per patient, earlier today she had 4 episodes of diarrhea. She denies abdominal pain, nausea, emesis, melena or hematochezia. After the fourth episode of loose stool, the patient decided to take a shower. She states that she had a long shower and after a while, all of a sudden, she started feeling hot. She is states that she turned the water from hot to cold trying to get some relief, but then felt short of breath and her heart pounding. Next thing she remembers is waking up with her face on the toilet bleeding profusely from her lip and mouth. She denies chest pain, dizziness, nausea, emesis, increasing pitting edema of the lower extremities PND orthopnea.   Her LOC lasted about less than 2 minutes, since her daughter, who was at home with the patient, states that she heard a loud sound and went to check her mother in the bathroom finding her on the toilet bleeding from her mouth.  The patient also states that she has been having occasional pressure in her chest associated with dyspnea with exertion, which relieves with rest. She has not been taking her amlodipine the last 3 months. She has not taken aspirin, Vytorin and gabapentin for the last 2 weeks.   ED Course: D-dimer was 2.18 ug/mL, blood glucose was 152 and creatinine 1.32 mg/dL, The patient was started on heparin infusion after CT angiogram of the chest revealed bilateral pulmonary embolism.   Review of Systems: As per HPI otherwise 10 point review of systems negative.    Past Medical History:  Diagnosis Date  . Acid  reflux   . Diabetes mellitus without complication ([place])   . High cholesterol   . Hypertension   . Neuropathy ([place])   . Pneumonia     Past Surgical History:  Procedure Laterality Date  . TUBAL LIGATION       reports that she quit smoking about 10 years ago. She has never used smokeless tobacco. She reports that she does not drink alcohol or use drugs.  Allergies  Allergen Reactions  . Lisinopril Other (See Comments)    Inflammation, coughing  . Metformin And Related Diarrhea  . Reglan [Metoclopramide] Other (See Comments)    Pt stated having lock jaw as a side effect.   . Sulfa Antibiotics Itching  . Wellbutrin [Bupropion] Cough    Family History  Problem Relation Age of Onset  . Pancreatitis Mother   . Diabetes type II Mother   . CAD Father   . Diabetes type II Father   . Diabetes type II Sister   . Diabetes type II Brother      Prior to Admission medications   Medication Sig Start Date End Date Taking? Authorizing Provider  amLODipine (NORVASC) 5 MG tablet Take 5 mg by mouth daily. Reported on 09/16/2015   Yes Historical Provider, MD  aspirin EC 81 MG tablet Take 81 mg by mouth daily.   Yes Historical Provider, MD  cholecalciferol (VITAMIN D) 1000 units tablet Take 1,000 Units by mouth 2 (two) times daily.   Yes Historical Provider, MD  ezetimibe-simvastatin (VYTORIN) 10-10 MG tablet Take 1  tablet by mouth daily.   Yes Historical Provider, MD  gabapentin (NEURONTIN) 400 MG capsule Take 400 mg by mouth 3 (three) times daily. 03/16/16  Yes Historical Provider, MD  glipiZIDE (GLUCOTROL) 10 MG tablet Take 10 mg by mouth 2 (two) times daily before a meal. Reported on 09/16/2015   Yes Historical Provider, MD  glucose monitoring kit (FREESTYLE) monitoring kit 1 each by Does not apply route 4 (four) times daily - after meals and at bedtime. 1 month Diabetic Testing Supplies for QAC-QHS accuchecks. 01/15/15  Yes Debbe Odea, MD  hydrochlorothiazide (MICROZIDE) 12.5 MG capsule Take 12.5  mg by mouth daily.   Yes Historical Provider, MD  insulin aspart (NOVOLOG) 100 UNIT/ML injection Inject 5 Units into the skin 2 (two) times daily.   Yes Historical Provider, MD  insulin glargine (LANTUS) 100 UNIT/ML injection Inject 20-65 Units into the skin 2 (two) times daily. Takes 20 units in the morning and 65 units at bedtime   Yes Historical Provider, MD  JANUVIA 100 MG tablet Take 100 mg by mouth 2 (two) times daily. 04/27/16  Yes Historical Provider, MD  loperamide (IMODIUM) 2 MG capsule Take 8 mg by mouth daily.   Yes Historical Provider, MD  MELATONIN PO Take 1 tablet by mouth daily.   Yes Historical Provider, MD  methocarbamol (ROBAXIN) 750 MG tablet Take 1,500 mg by mouth 2 (two) times daily as needed for muscle spasms.   Yes Historical Provider, MD  metoprolol tartrate (LOPRESSOR) 25 MG tablet Take 25 mg by mouth 2 (two) times daily.   Yes Historical Provider, MD  omeprazole (PRILOSEC) 40 MG capsule Take 40 mg by mouth daily. 03/16/16  Yes Historical Provider, MD  PARoxetine (PAXIL) 40 MG tablet Take 40 mg by mouth daily with breakfast.   Yes Historical Provider, MD  HYDROcodone-acetaminophen (NORCO/VICODIN) 5-325 MG tablet Take 1-2 tablets by mouth every 4 (four) hours as needed for moderate pain. Patient not taking: Reported on 05/17/2016 02/24/16   Junius Creamer, NP  predniSONE (DELTASONE) 10 MG tablet Take 2 tablets (20 mg total) by mouth daily. Patient not taking: Reported on 05/17/2016 02/24/16   Junius Creamer, NP    Physical Exam: Vitals:   05/17/16 1911 05/17/16 1930 05/17/16 2151  BP:  151/89   Pulse:  77   Resp:  20   Temp:  99 F (37.2 C)   TempSrc:  Oral   SpO2: 97%    Weight:   108.9 kg (240 lb)  Height:   '5\' 4"'$  (1.626 m)      Constitutional: NAD, calm, comfortable Vitals:   05/17/16 1911 05/17/16 1930 05/17/16 2151  BP:  151/89   Pulse:  77   Resp:  20   Temp:  99 F (37.2 C)   TempSrc:  Oral   SpO2: 97%    Weight:   108.9 kg (240 lb)  Height:   '5\' 4"'$  (1.626  m)   Eyes: PERRL, lids and conjunctivae normal ENMT: Positive lacerations and edema on lower lip left area. Fractured superior incisors. Mucous membranes are moist. Posterior pharynx clear of any exudate or lesions. Neck: normal, supple, no masses, no thyromegaly Respiratory: clear to auscultation bilaterally, no wheezing, no crackles. Normal respiratory effort. No accessory muscle use.  Cardiovascular: Regular rate and rhythm, no murmurs / rubs / gallops. No extremity edema. 2+ pedal pulses. No carotid bruits.  Abdomen: Bowel sounds positive. Soft, no tenderness, no masses palpated. No hepatosplenomegaly.  Musculoskeletal: no clubbing / cyanosis. No joint deformity  upper and lower extremities. Good ROM, no contractures. Normal muscle tone.  Skin: no rashes, lesions, on limited skin exam. Neuro: CN 2-12 grossly intact. Sensation intact, DTR normal. Strength 5/5 in all 4.  Psychiatric: Normal judgment and insight. Alert and oriented x 4. Normal mood.     Labs on Admission: I have personally reviewed following labs and imaging studies  CBC:  Recent Labs Lab 05/17/16 2031  WBC 10.6*  NEUTROABS 8.0*  HGB 14.6  HCT 44.5  MCV 90.1  PLT 585   Basic Metabolic Panel:  Recent Labs Lab 05/17/16 2031  NA 137  K 4.2  CL 105  CO2 23  GLUCOSE 152*  BUN 20  CREATININE 1.32*  CALCIUM 9.0   GFR: Estimated Creatinine Clearance: 60.1 mL/min (by C-G formula based on SCr of 1.32 mg/dL). Liver Function Tests:  Recent Labs Lab 05/17/16 2031  AST 23  ALT 21  ALKPHOS 110  BILITOT 0.5  PROT 8.2*  ALBUMIN 3.3*    Urine analysis:    Component Value Date/Time   COLORURINE YELLOW 05/17/2016 1940   APPEARANCEUR CLEAR 05/17/2016 1940   LABSPEC 1.015 05/17/2016 1940   PHURINE 6.0 05/17/2016 1940   GLUCOSEU NEGATIVE 05/17/2016 1940   HGBUR TRACE (A) 05/17/2016 1940   BILIRUBINUR NEGATIVE 05/17/2016 1940   KETONESUR NEGATIVE 05/17/2016 1940   PROTEINUR >300 (A) 05/17/2016 1940    UROBILINOGEN 0.2 01/13/2015 2000   NITRITE NEGATIVE 05/17/2016 1940   LEUKOCYTESUR NEGATIVE 05/17/2016 1940     Radiological Exams on Admission: Dg Chest 2 View  Result Date: 05/17/2016 CLINICAL DATA:  Recent fall with chest pain EXAM: CHEST  2 VIEW COMPARISON:  None. FINDINGS: The heart size and mediastinal contours are within normal limits. Both lungs are clear. The visualized skeletal structures are unremarkable. IMPRESSION: No active cardiopulmonary disease. Electronically Signed   By: Inez Catalina M.D.   On: 05/17/2016 20:17   Ct Head Wo Contrast  Result Date: 05/17/2016 CLINICAL DATA:  55 year old female status post syncope and fall EXAM: CT HEAD WITHOUT CONTRAST CT MAXILLOFACIAL WITHOUT CONTRAST TECHNIQUE: Multidetector CT imaging of the head and maxillofacial structures were performed using the standard protocol without intravenous contrast. Multiplanar CT image reconstructions of the maxillofacial structures were also generated. COMPARISON:  Prior CT scan of the cervical spine 01/13/2015 ; prior head CT 10/25/2012 FINDINGS: CT HEAD FINDINGS Brain: No evidence of acute infarction, hemorrhage, hydrocephalus, extra-axial collection or mass lesion/mass effect. Vascular: No hyperdense vessel or unexpected calcification. Skull: Normal. Negative for fracture or focal lesion. Hyperostosis frontalis interna. Sinuses/Orbits: No acute finding. Other: None CT MAXILLOFACIAL FINDINGS Osseous: The right maxillary central incisor is dislodged and there are small adjacent fracture fragments from the injured alveolar ridge. No additional facial fracture identified. The mandible is intact. Orbits: Negative. No traumatic or inflammatory finding. Sinuses: Clear. Soft tissues: Focal edema of the right aspect of the lower lip. There are 3 high density fragments within the lower lip soft tissues measuring 6, 2 and 3 mm respectively. These fragments likely represent tooth fragments. Large carie in the right mandibular  second molar. Additionally, the right maxillary second premolar is broken. Limited intracranial: No significant or unexpected finding. IMPRESSION: CT HEAD 1. Negative CT FACE 1. Anterior and superior displacement of the right maxillary central incisor with fracture of the anterior alveolar ridge. No additional facial fractures identified. 2. Soft tissue injury to the right lower lip which contains 3 high-density fragments favored to represent tooth fragments. The fragments range in size from 2-6  mm. 3. Broken versus carious right maxillary second premolar. 4. Carious right mandibular second molar. Electronically Signed   By: Jacqulynn Cadet M.D.   On: 05/17/2016 20:34   Ct Angio Chest Pe W And/or Wo Contrast  Result Date: 05/17/2016 CLINICAL DATA:  55 year old female status post syncope and fall EXAM: CT ANGIOGRAPHY CHEST WITH CONTRAST TECHNIQUE: Multidetector CT imaging of the chest was performed using the standard protocol during bolus administration of intravenous contrast. Multiplanar CT image reconstructions and MIPs were obtained to evaluate the vascular anatomy. CONTRAST:  100 mL Isovue 370 COMPARISON:  Chest x-ray 05/17/2016 FINDINGS: Cardiovascular: Adequate opacification of the pulmonary arteries to the proximal subsegmental level. There are multifocal filling defects within right lower lobe segmental and subsegmental pulmonary arteries consistent with acute PE. On the left, there are also multiple filling defects in the segmental and subsegmental pulmonary arteries. No evidence of right heart strain. The RV/ LV ratio is normal. The heart itself is normal in size. No pericardial effusion. Atherosclerotic calcifications are present in the coronary arteries. Mediastinum/Nodes: Approximately 2.2 cm heterogeneous left thyroid nodule. No suspicious mediastinal or hilar adenopathy. Unremarkable thoracic aorta. Lungs/Pleura: The lungs are clear.  No pleural effusion. Upper Abdomen: Visualized upper abdominal  organs are unremarkable. Musculoskeletal: No acute fracture or aggressive appearing lytic or blastic osseous lesion. Review of the MIP images confirms the above findings. IMPRESSION: 1. Positive for bilateral lower lobe segmental and subsegmental pulmonary emboli. No evidence of right heart strain. 2. Incidentally detected approximately 2.2 cm heterogeneous left thyroid nodule. This has been previously imaged and biopsied. 3. Coronary artery calcifications. Please note that although the presence of coronary artery calcium documents the presence of coronary artery disease, the severity of this disease and any potential stenosis cannot be assessed on this non-gated CT examination. Assessment for potential risk factor modification, dietary therapy or pharmacologic therapy may be warranted, if clinically indicated. These results were called by telephone at the time of interpretation on 05/17/2016 at 9:35 pm to Dr. Delrae Rend , who verbally acknowledged these results. Electronically Signed   By: Jacqulynn Cadet M.D.   On: 05/17/2016 21:38   Ct Maxillofacial Wo Contrast  Result Date: 05/17/2016 CLINICAL DATA:  55 year old female status post syncope and fall EXAM: CT HEAD WITHOUT CONTRAST CT MAXILLOFACIAL WITHOUT CONTRAST TECHNIQUE: Multidetector CT imaging of the head and maxillofacial structures were performed using the standard protocol without intravenous contrast. Multiplanar CT image reconstructions of the maxillofacial structures were also generated. COMPARISON:  Prior CT scan of the cervical spine 01/13/2015 ; prior head CT 10/25/2012 FINDINGS: CT HEAD FINDINGS Brain: No evidence of acute infarction, hemorrhage, hydrocephalus, extra-axial collection or mass lesion/mass effect. Vascular: No hyperdense vessel or unexpected calcification. Skull: Normal. Negative for fracture or focal lesion. Hyperostosis frontalis interna. Sinuses/Orbits: No acute finding. Other: None CT MAXILLOFACIAL FINDINGS Osseous: The right  maxillary central incisor is dislodged and there are small adjacent fracture fragments from the injured alveolar ridge. No additional facial fracture identified. The mandible is intact. Orbits: Negative. No traumatic or inflammatory finding. Sinuses: Clear. Soft tissues: Focal edema of the right aspect of the lower lip. There are 3 high density fragments within the lower lip soft tissues measuring 6, 2 and 3 mm respectively. These fragments likely represent tooth fragments. Large carie in the right mandibular second molar. Additionally, the right maxillary second premolar is broken. Limited intracranial: No significant or unexpected finding. IMPRESSION: CT HEAD 1. Negative CT FACE 1. Anterior and superior displacement of the right maxillary central  incisor with fracture of the anterior alveolar ridge. No additional facial fractures identified. 2. Soft tissue injury to the right lower lip which contains 3 high-density fragments favored to represent tooth fragments. The fragments range in size from 2-6 mm. 3. Broken versus carious right maxillary second premolar. 4. Carious right mandibular second molar. Electronically Signed   By: Jacqulynn Cadet M.D.   On: 05/17/2016 20:34    EKG: Independently reviewed. Vent. rate 82 BPM PR interval * ms QRS duration 88 ms QT/QTc 383/448 ms P-R-T axes 67 51 60 Sinus Rhythm.  Assessment/Plan Principal Problem:   Bilateral pulmonary embolism ([place]) Admit to telemetry/inpatient. Continue heparin infusion per pharmacy. We'll not resume daily aspirin 81 mg. Follow-up thrombophilia panel labs. Check echocardiogram in the morning. Discussed with the patient briefly the need for at least 6 months of anticoagulation.  Active Problems:   Syncope Likely due to a combination of hot shower induced vasodilation and volume depletion secondary to diarrhea. Telemetry monitoring. Normal saline 100 mL per hour 10 hours. Serial troponin levels. Check carotid  Doppler. Check echocardiogram in the morning.    Hypertension Resume amlodipine 5 mg by mouth daily. Continue metoprolol 25 mg by mouth twice a day. Continue hydrochlorothiazide 12.5 mg by mouth daily. Monitor blood pressure periodically.    Hyperlipidemia Continue daily Vytorin 10-10 orally. Monitor LFTs periodically.    Depression Continue paroxetine 40 mg by mouth daily.    Diabetic peripheral neuropathy ([facility]) Resume gabapentin 400 mg by mouth 3 times a day.    Type 2 diabetes mellitus ([facility]) Carbohydrate modified diet. Continue Lantus 20 units in the morning and 65 units at bedtime. Continue glipizide 10 mg by mouth daily Continue Januvia or formulary alternative.      GERD Pantoprazole 40 mg by mouth daily.    DVT prophylaxis: On heparin infusion. Code Status: Full code. Family Communication: Her daughter and son were present in the room. Disposition Plan: Admit for syncope workup and PE treatment. Consults called:  Admission status: Inpatient/telemetry.    Reubin Milan MD [facility] Pager [phone].  If 7PM-7AM, please contact night-coverage [email or web address] Password [email or web address]  05/17/2016, 10:30 PM

## 2016-05-17 NOTE — ED Provider Notes (Signed)
[facility] DEPT Provider Note   CSN: [id number] Arrival date & time: 05/17/16  1907  History   Chief Complaint Chief Complaint  Patient presents with  . Loss of Consciousness   HPI  Tamara Henry is an 55 y.o. female with history of HTN and DM who presents to the ED for evaluation after a syncopal episode. She states all day she has been feeling a bit unwell with four episodes of very watery diarrhea. She states just prior to arrival she had an episode of diarrhea and then took a shower. She states she had been in the shower for "a while" and was finishing up when she suddenly started to feel hot. She states she had palpitations and shortness of breath. She states the next thing she knew she woke up laying against the toilet with bleeding from her mouth. She states she has never passed out completely before. States she did not have chest pain today. She denies abdominal pain, nausea, vomiting. Ms. Pain does state that for the past several months she has had intermittent exertional chest pain and dyspnea on exertion. She states sometimes she gets winded walking down the hallway. She states she also feels short of breath and lightheaded anytime she changes positions, such as going from sitting to standing. She states she has not talked to a healthcare provider regarding her complaints because she just established primary care and has her first appointment next week. Denies history of heart disease. Denies history of malignancy or blood clots. Father has CAD. Does not smoke. Denies recent travel. Denies leg pain or swelling.  Past Medical History:  Diagnosis Date  . Acid reflux   . Diabetes mellitus without complication ([place])   . High cholesterol   . Hypertension   . Neuropathy ([place])   . Pneumonia     Patient Active Problem List   Diagnosis Date Noted  . Neuritis of upper extremity, C7 01/15/2015  . Urinary retention 01/14/2015  . Acute renal failure ([place]) 01/14/2015  . DM type 2,  uncontrolled, with renal complications ([place]) 88/50/2774  . Hypertension 01/14/2015  . Bowel incontinence 01/14/2015  . Numbness and tingling of right arm 01/14/2015    Past Surgical History:  Procedure Laterality Date  . TUBAL LIGATION      OB History    Gravida Para Term Preterm AB Living   _0 SAB TAB Ectopic Multiple Live Births     1             Home Medications    Prior to Admission medications   Medication Sig Start Date End Date Taking? Authorizing Provider  amLODipine (NORVASC) 5 MG tablet Take 5 mg by mouth daily. Reported on 09/16/2015    Historical Provider, MD  aspirin 325 MG tablet Take 325 mg by mouth daily. Reported on 09/16/2015    Historical Provider, MD  docusate sodium (COLACE) 100 MG capsule Take 1 capsule (100 mg total) by mouth 2 (two) times daily. Patient not taking: Reported on 09/16/2015 01/15/15   Debbe Odea, MD  Ezetimibe-Simvastatin (VYTORIN PO) Take 1 tablet by mouth daily. Reported on 09/16/2015    Historical Provider, MD  gabapentin (NEURONTIN) 300 MG capsule Take 300 mg by mouth 2 (two) times daily. Reported on 09/16/2015    Historical Provider, MD  glipiZIDE (GLUCOTROL) 10 MG tablet Take 10 mg by mouth 2 (two) times daily before a meal. Reported on 09/16/2015    Historical Provider, MD  glucose  monitoring kit (FREESTYLE) monitoring kit 1 each by Does not apply route 4 (four) times daily - after meals and at bedtime. 1 month Diabetic Testing Supplies for QAC-QHS accuchecks. Patient not taking: Reported on 09/16/2015 01/15/15   Debbe Odea, MD  guaiFENesin (MUCINEX) 600 MG 12 hr tablet Take 2 tablets (1,200 mg total) by mouth 2 (two) times daily. Patient not taking: Reported on 09/16/2015 04/24/13   Linton Flemings, MD  HYDROcodone-acetaminophen (NORCO/VICODIN) 5-325 MG tablet Take 1-2 tablets by mouth every 4 (four) hours as needed for moderate pain. 02/24/16   Junius Creamer, NP  MAGNESIUM PO Take 1 tablet by mouth daily. Reported on 09/16/2015    Historical  Provider, MD  Melatonin 3 MG CAPS Take 3 mg by mouth at bedtime. Reported on 09/16/2015    Historical Provider, MD  Multiple Vitamins-Calcium (ONE-A-DAY WOMENS PO) Take 1 tablet by mouth daily. Reported on 09/16/2015    Historical Provider, MD  ofloxacin (OCUFLOX) 0.3 % ophthalmic solution Place 1 drop into the left eye 4 (four) times daily. Reported on 09/16/2015    Historical Provider, MD  omeprazole (PRILOSEC) 20 MG capsule Take 20 mg by mouth daily. Reported on 09/16/2015 10/26/12   Fredia Sorrow, MD  ondansetron (ZOFRAN) 4 MG tablet Take 1 tablet (4 mg total) by mouth every 6 (six) hours. Patient not taking: Reported on 09/16/2015 06/20/13   Orpah Greek, MD  PARoxetine (PAXIL) 20 MG tablet Take 20 mg by mouth every morning. Reported on 09/16/2015    Historical Provider, MD  predniSONE (DELTASONE) 10 MG tablet Take 2 tablets (20 mg total) by mouth daily. 02/24/16   Junius Creamer, NP  senna (SENOKOT) 8.6 MG TABS tablet Take 1 tablet (8.6 mg total) by mouth daily as needed for mild constipation. Patient not taking: Reported on 09/16/2015 01/15/15   Debbe Odea, MD  SitaGLIPtin Phosphate (JANUVIA PO) Take 1 tablet by mouth daily. Reported on 09/16/2015    Historical Provider, MD    Family History Family History  Problem Relation Age of Onset  . Pancreatitis Mother   . Diabetes type II Mother   . CAD Father   . Diabetes type II Father   . Diabetes type II Sister   . Diabetes type II Brother     Social History Social History  Substance Use Topics  . Smoking status: Former Smoker    Quit date: 09/11/2005  . Smokeless tobacco: Never Used  . Alcohol use No     Allergies   Lisinopril; Metformin and related; and Sulfa antibiotics   Review of Systems Review of Systems  All other systems reviewed and are negative.    Physical Exam Updated Vital Signs BP 151/89 (BP Location: Right Arm)   Pulse 77   Temp 99 F (37.2 C) (Oral)   Resp 20   SpO2 97%   Physical Exam  Constitutional: She  is oriented to person, place, and time.  HENT:  Right Ear: External ear normal.  Left Ear: External ear normal.  Dried blood around lips. Teeth #8, 9 are chipped. Lower lip on 1cm laceration and three 0.5 cm lacerations (not through and through). Bleeding well controlled. Superior teeth tender to palpation though not obviously loose. 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 Tolerating secretions No other facial tenderness or crepitus  Eyes: Conjunctivae and EOM are normal. Pupils are equal, round, and reactive to light.  Neck: Normal range of motion. Neck supple.  No c-spine tenderness  Cardiovascular: Normal  rate, regular rhythm, normal heart sounds and intact distal pulses.   Pulmonary/Chest: Effort normal and breath sounds normal. No respiratory distress. She has no wheezes.  Abdominal: Soft. Bowel sounds are normal. She exhibits no distension. There is no tenderness. There is no rebound and no guarding.  Musculoskeletal: She exhibits no edema.  No LE edema or tenderness  Lymphadenopathy:    She has no cervical adenopathy.  Neurological: She is alert and oriented to person, place, and time. No cranial nerve deficit.  Skin: Skin is warm and dry.  Psychiatric: She has a normal mood and affect.  Nursing note and vitals reviewed.    ED Treatments / Results  Labs (all labs ordered are listed, but only abnormal results are displayed) Labs Reviewed  COMPREHENSIVE METABOLIC PANEL - Abnormal; Notable for the following:       Result Value   Glucose, Bld 152 (*)    Creatinine, Ser 1.32 (*)    Total Protein 8.2 (*)    Albumin 3.3 (*)    GFR calc non Af Amer 45 (*)    GFR calc Af Amer 53 (*)    All other components within normal limits  CBC WITH DIFFERENTIAL/PLATELET - Abnormal; Notable for the following:    WBC 10.6 (*)    Neutro Abs 8.0 (*)    All other components within normal limits  URINALYSIS, ROUTINE W REFLEX MICROSCOPIC (NOT AT [facility]) - Abnormal; Notable  for the following:    Hgb urine dipstick TRACE (*)    Protein, ur >300 (*)    All other components within normal limits  D-DIMER, QUANTITATIVE (NOT AT [facility]) - Abnormal; Notable for the following:    D-Dimer, Quant 2.18 (*)    All other components within normal limits  URINE MICROSCOPIC-ADD ON - Abnormal; Notable for the following:    Squamous Epithelial / LPF 6-30 (*)    Bacteria, UA RARE (*)    All other components within normal limits  ANTITHROMBIN III  PROTEIN C ACTIVITY  PROTEIN C, TOTAL  PROTEIN S ACTIVITY  PROTEIN S, TOTAL  LUPUS ANTICOAGULANT PANEL  BETA-2-GLYCOPROTEIN I ABS, IGG/M/A  HOMOCYSTEINE  FACTOR 5 LEIDEN  PROTHROMBIN GENE MUTATION  CARDIOLIPIN ANTIBODIES, IGG, IGM, IGA  APTT  PROTIME-INR  HEPARIN LEVEL (UNFRACTIONATED)  CBC  I-STAT TROPOININ, ED  POC URINE PREG, ED    EKG  EKG Interpretation  Date/Time:  Wednesday May 17 2016 20:16:36 EDT Ventricular Rate:  82 PR Interval:    QRS Duration: 88 QT Interval:  383 QTC Calculation: 448 R Axis:   51 Text Interpretation:  Sinus rhythm Confirmed by Alvino Chapel  MD, NATHAN [phone]) on 05/17/2016 9:02:32 PM       Radiology Dg Chest 2 View  Result Date: 05/17/2016 CLINICAL DATA:  Recent fall with chest pain EXAM: CHEST  2 VIEW COMPARISON:  None. FINDINGS: The heart size and mediastinal contours are within normal limits. Both lungs are clear. The visualized skeletal structures are unremarkable. IMPRESSION: No active cardiopulmonary disease. Electronically Signed   By: Inez Catalina M.D.   On: 05/17/2016 20:17   Ct Head Wo Contrast  Result Date: 05/17/2016 CLINICAL DATA:  55 year old female status post syncope and fall EXAM: CT HEAD WITHOUT CONTRAST CT MAXILLOFACIAL WITHOUT CONTRAST TECHNIQUE: Multidetector CT imaging of the head and maxillofacial structures were performed using the standard protocol without intravenous contrast. Multiplanar CT image reconstructions of the maxillofacial structures were also  generated. COMPARISON:  Prior CT scan of the cervical spine 01/13/2015 ; prior head  CT 10/25/2012 FINDINGS: CT HEAD FINDINGS Brain: No evidence of acute infarction, hemorrhage, hydrocephalus, extra-axial collection or mass lesion/mass effect. Vascular: No hyperdense vessel or unexpected calcification. Skull: Normal. Negative for fracture or focal lesion. Hyperostosis frontalis interna. Sinuses/Orbits: No acute finding. Other: None CT MAXILLOFACIAL FINDINGS Osseous: The right maxillary central incisor is dislodged and there are small adjacent fracture fragments from the injured alveolar ridge. No additional facial fracture identified. The mandible is intact. Orbits: Negative. No traumatic or inflammatory finding. Sinuses: Clear. Soft tissues: Focal edema of the right aspect of the lower lip. There are 3 high density fragments within the lower lip soft tissues measuring 6, 2 and 3 mm respectively. These fragments likely represent tooth fragments. Large carie in the right mandibular second molar. Additionally, the right maxillary second premolar is broken. Limited intracranial: No significant or unexpected finding. IMPRESSION: CT HEAD 1. Negative CT FACE 1. Anterior and superior displacement of the right maxillary central incisor with fracture of the anterior alveolar ridge. No additional facial fractures identified. 2. Soft tissue injury to the right lower lip which contains 3 high-density fragments favored to represent tooth fragments. The fragments range in size from 2-6 mm. 3. Broken versus carious right maxillary second premolar. 4. Carious right mandibular second molar. Electronically Signed   By: Jacqulynn Cadet M.D.   On: 05/17/2016 20:34   Ct Angio Chest Pe W And/or Wo Contrast  Result Date: 05/17/2016 CLINICAL DATA:  55 year old female status post syncope and fall EXAM: CT ANGIOGRAPHY CHEST WITH CONTRAST TECHNIQUE: Multidetector CT imaging of the chest was performed using the standard protocol during  bolus administration of intravenous contrast. Multiplanar CT image reconstructions and MIPs were obtained to evaluate the vascular anatomy. CONTRAST:  100 mL Isovue 370 COMPARISON:  Chest x-ray 05/17/2016 FINDINGS: Cardiovascular: Adequate opacification of the pulmonary arteries to the proximal subsegmental level. There are multifocal filling defects within right lower lobe segmental and subsegmental pulmonary arteries consistent with acute PE. On the left, there are also multiple filling defects in the segmental and subsegmental pulmonary arteries. No evidence of right heart strain. The RV/ LV ratio is normal. The heart itself is normal in size. No pericardial effusion. Atherosclerotic calcifications are present in the coronary arteries. Mediastinum/Nodes: Approximately 2.2 cm heterogeneous left thyroid nodule. No suspicious mediastinal or hilar adenopathy. Unremarkable thoracic aorta. Lungs/Pleura: The lungs are clear.  No pleural effusion. Upper Abdomen: Visualized upper abdominal organs are unremarkable. Musculoskeletal: No acute fracture or aggressive appearing lytic or blastic osseous lesion. Review of the MIP images confirms the above findings. IMPRESSION: 1. Positive for bilateral lower lobe segmental and subsegmental pulmonary emboli. No evidence of right heart strain. 2. Incidentally detected approximately 2.2 cm heterogeneous left thyroid nodule. This has been previously imaged and biopsied. 3. Coronary artery calcifications. Please note that although the presence of coronary artery calcium documents the presence of coronary artery disease, the severity of this disease and any potential stenosis cannot be assessed on this non-gated CT examination. Assessment for potential risk factor modification, dietary therapy or pharmacologic therapy may be warranted, if clinically indicated. These results were called by telephone at the time of interpretation on 05/17/2016 at 9:35 pm to Dr. Delrae Rend , who verbally  acknowledged these results. Electronically Signed   By: Jacqulynn Cadet M.D.   On: 05/17/2016 21:38   Ct Maxillofacial Wo Contrast  Result Date: 05/17/2016 CLINICAL DATA:  55 year old female status post syncope and fall EXAM: CT HEAD WITHOUT CONTRAST CT MAXILLOFACIAL WITHOUT CONTRAST TECHNIQUE: Multidetector CT imaging  of the head and maxillofacial structures were performed using the standard protocol without intravenous contrast. Multiplanar CT image reconstructions of the maxillofacial structures were also generated. COMPARISON:  Prior CT scan of the cervical spine 01/13/2015 ; prior head CT 10/25/2012 FINDINGS: CT HEAD FINDINGS Brain: No evidence of acute infarction, hemorrhage, hydrocephalus, extra-axial collection or mass lesion/mass effect. Vascular: No hyperdense vessel or unexpected calcification. Skull: Normal. Negative for fracture or focal lesion. Hyperostosis frontalis interna. Sinuses/Orbits: No acute finding. Other: None CT MAXILLOFACIAL FINDINGS Osseous: The right maxillary central incisor is dislodged and there are small adjacent fracture fragments from the injured alveolar ridge. No additional facial fracture identified. The mandible is intact. Orbits: Negative. No traumatic or inflammatory finding. Sinuses: Clear. Soft tissues: Focal edema of the right aspect of the lower lip. There are 3 high density fragments within the lower lip soft tissues measuring 6, 2 and 3 mm respectively. These fragments likely represent tooth fragments. Large carie in the right mandibular second molar. Additionally, the right maxillary second premolar is broken. Limited intracranial: No significant or unexpected finding. IMPRESSION: CT HEAD 1. Negative CT FACE 1. Anterior and superior displacement of the right maxillary central incisor with fracture of the anterior alveolar ridge. No additional facial fractures identified. 2. Soft tissue injury to the right lower lip which contains 3 high-density fragments favored  to represent tooth fragments. The fragments range in size from 2-6 mm. 3. Broken versus carious right maxillary second premolar. 4. Carious right mandibular second molar. Electronically Signed   By: Jacqulynn Cadet M.D.   On: 05/17/2016 20:34    Procedures Procedures (including critical care time)  NERVE BLOCK Performed by: Delrae Rend Consent: Verbal consent obtained. Required items: required blood products, implants, devices, and special equipment available Time out: Immediately prior to procedure a "time out" was called to verify the correct patient, procedure, equipment, support staff and site/side marked as required.  Indication: lip laceration Nerve block body site: right mental nerve  Preparation: Patient was prepped and draped in the usual sterile fashion. Needle gauge: 24 G Location technique: anatomical landmarks  Local anesthetic: 1% lidocaine without epi  Anesthetic total: 3 ml  Outcome: pain improved Patient tolerance: Patient tolerated the procedure well with no immediate complications.    Medications Ordered in ED Medications  heparin bolus via infusion 2,500 Units (not administered)  heparin ADULT infusion 100 units/mL (25000 units/231m sodium chloride 0.45%) (not administered)  morphine 4 MG/ML injection 4 mg (4 mg Intravenous Given 05/17/16 2031)  ondansetron (ZOFRAN) injection 4 mg (4 mg Intravenous Given 05/17/16 2031)  lidocaine (PF) (XYLOCAINE) 1 % injection 5 mL (5 mLs Infiltration Given 05/17/16 2225)  iopamidol (ISOVUE-370) 76 % injection 100 mL (100 mLs Intravenous Contrast Given 05/17/16 2119)     Initial Impression / Assessment and Plan / ED Course  I have reviewed the triage vital signs and the nursing notes.  Pertinent labs & imaging results that were available during my care of the patient were reviewed by me and considered in my medical decision making (see chart for details).  Clinical Course    Pt presenting after syncopal episode. Diarrhea x  4 today. Was in the shower, felt hot, palpitations, shortness of breath. Then passed out, woke up having hit her face on toilet. Reports months of dyspnea on exertion, lightheadedness, and exertional chest pain. No other risk factors for PE. Ultimately found to have bilateral PEs in the ED. Started on heparin. She also has a displaced maxillary incisor with associated alveolar  ridge fracture; initially had planned to recommend outpatient f/u pending workup here. However, given medical admission I was asked to consult maxillofacial surgery for recs. I spoke with Dr. Iran Planas (plastic surgery) who recommends soft diet for now. In daytime hrs pt needs to have dental consult ([facility] usually has daytime dentist on call). Otherwise, I performed a mental nerve block and helped pt wash out her lacerations.  I spoke to Dr. Olevia Bowens who will admit pt to the hospitalist service.   Final Clinical Impressions(s) / ED Diagnoses   Final diagnoses:  Bilateral pulmonary embolism (HCC)  Syncope, unspecified syncope type  Closed fracture of alveolar ridge of maxilla, initial encounter  AKI (acute kidney injury) [facility])    New Prescriptions New Prescriptions   No medications on file     Anne Ng, Hershal Coria 05/17/16 2235    Davonna Belling, MD 05/18/16 [id number]

## 2016-05-18 ENCOUNTER — Inpatient Hospital Stay (HOSPITAL_COMMUNITY): Payer: Medicare Other

## 2016-05-18 ENCOUNTER — Encounter (HOSPITAL_COMMUNITY): Payer: Self-pay

## 2016-05-18 DIAGNOSIS — R55 Syncope and collapse: Secondary | ICD-10-CM | POA: Diagnosis present

## 2016-05-18 LAB — CBC WITH DIFFERENTIAL/PLATELET
BASOS ABS: 0 10*3/uL (ref 0.0–0.1)
BASOS PCT: 0 %
EOS ABS: 0.1 10*3/uL (ref 0.0–0.7)
Eosinophils Relative: 1 %
HCT: 39.5 % (ref 36.0–46.0)
Hemoglobin: 12.2 g/dL (ref 12.0–15.0)
Lymphocytes Relative: 29 %
Lymphs Abs: 3.1 10*3/uL (ref 0.7–4.0)
MCH: 28.3 pg (ref 26.0–34.0)
MCHC: 30.9 g/dL (ref 30.0–36.0)
MCV: 91.6 fL (ref 78.0–100.0)
MONO ABS: 0.5 10*3/uL (ref 0.1–1.0)
MONOS PCT: 5 %
NEUTROS ABS: 6.9 10*3/uL (ref 1.7–7.7)
NEUTROS PCT: 65 %
Platelets: 229 10*3/uL (ref 150–400)
RBC: 4.31 MIL/uL (ref 3.87–5.11)
RDW: 14 % (ref 11.5–15.5)
WBC: 10.6 10*3/uL — ABNORMAL HIGH (ref 4.0–10.5)

## 2016-05-18 LAB — CBC
HCT: 40.1 % (ref 36.0–46.0)
HEMOGLOBIN: 12.7 g/dL (ref 12.0–15.0)
MCH: 28.6 pg (ref 26.0–34.0)
MCHC: 31.7 g/dL (ref 30.0–36.0)
MCV: 90.3 fL (ref 78.0–100.0)
Platelets: 248 10*3/uL (ref 150–400)
RBC: 4.44 MIL/uL (ref 3.87–5.11)
RDW: 14 % (ref 11.5–15.5)
WBC: 11.5 10*3/uL — ABNORMAL HIGH (ref 4.0–10.5)

## 2016-05-18 LAB — VAS US CAROTID
LCCADSYS: -90 cm/s
LCCAPDIAS: 21 cm/s
LCCAPSYS: 99 cm/s
LEFT ECA DIAS: -11 cm/s
LEFT VERTEBRAL DIAS: 6 cm/s
LICADDIAS: -35 cm/s
LICADSYS: -96 cm/s
Left CCA dist dias: -25 cm/s
Left ICA prox dias: -27 cm/s
Left ICA prox sys: -75 cm/s
RCCADSYS: -69 cm/s
RCCAPDIAS: 16 cm/s
RIGHT ECA DIAS: -12 cm/s
RIGHT VERTEBRAL DIAS: -10 cm/s
Right CCA prox sys: 68 cm/s

## 2016-05-18 LAB — COMPREHENSIVE METABOLIC PANEL
ALK PHOS: 89 U/L (ref 38–126)
ALT: 17 U/L (ref 14–54)
ANION GAP: 9 (ref 5–15)
AST: 21 U/L (ref 15–41)
Albumin: 2.7 g/dL — ABNORMAL LOW (ref 3.5–5.0)
BILIRUBIN TOTAL: 0.2 mg/dL — AB (ref 0.3–1.2)
BUN: 21 mg/dL — ABNORMAL HIGH (ref 6–20)
CALCIUM: 8.2 mg/dL — AB (ref 8.9–10.3)
CO2: 24 mmol/L (ref 22–32)
CREATININE: 1.8 mg/dL — AB (ref 0.44–1.00)
Chloride: 101 mmol/L (ref 101–111)
GFR calc non Af Amer: 31 mL/min — ABNORMAL LOW (ref >60)
GFR, EST AFRICAN AMERICAN: 36 mL/min — AB (ref >60)
Glucose, Bld: 115 mg/dL — ABNORMAL HIGH (ref 65–99)
Potassium: 4 mmol/L (ref 3.5–5.1)
SODIUM: 134 mmol/L — AB (ref 135–145)
TOTAL PROTEIN: 7 g/dL (ref 6.5–8.1)

## 2016-05-18 LAB — GLUCOSE, CAPILLARY
GLUCOSE-CAPILLARY: 69 mg/dL (ref 65–99)
GLUCOSE-CAPILLARY: 88 mg/dL (ref 65–99)
GLUCOSE-CAPILLARY: 49 mg/dL — AB (ref 65–99)
Glucose-Capillary: 102 mg/dL — ABNORMAL HIGH (ref 65–99)
Glucose-Capillary: 60 mg/dL — ABNORMAL LOW (ref 65–99)
Glucose-Capillary: 68 mg/dL (ref 65–99)
Glucose-Capillary: 87 mg/dL (ref 65–99)
Glucose-Capillary: 90 mg/dL (ref 65–99)

## 2016-05-18 LAB — ECHOCARDIOGRAM COMPLETE
HEIGHTINCHES: 64 in
WEIGHTICAEL: 3664.93 [oz_av]

## 2016-05-18 LAB — HEPARIN LEVEL (UNFRACTIONATED)
Heparin Unfractionated: 0.87 IU/mL — ABNORMAL HIGH (ref 0.30–0.70)
Heparin Unfractionated: 0.86 IU/mL — ABNORMAL HIGH (ref 0.30–0.70)

## 2016-05-18 LAB — TROPONIN I
Troponin I: <0.03 ng/mL (ref <0.03)
Troponin I: <0.03 ng/mL (ref <0.03)

## 2016-05-18 LAB — ANTITHROMBIN III: AntiThromb III Func: 121 % — ABNORMAL HIGH (ref 75–120)

## 2016-05-18 MED ORDER — HEPARIN (PORCINE) IN NACL 100-0.45 UNIT/ML-% IJ SOLN: 1350.0000 [IU]/h | INTRAMUSCULAR | Status: DC

## 2016-05-18 MED ORDER — OXYCODONE HCL 5 MG PO TABS: 10.0000 mg | ORAL_TABLET | ORAL | Status: DC | PRN

## 2016-05-18 MED ORDER — ZOLPIDEM TARTRATE 5 MG PO TABS
5.0000 mg | ORAL_TABLET | Freq: Every evening | ORAL | Status: DC | PRN
  Administered 2016-05-18 – 2016-05-19 (×2): 5 mg via ORAL
  Filled 2016-05-18 (×2): qty 1

## 2016-05-18 MED ORDER — RIVAROXABAN 20 MG PO TABS: 20.0000 mg | ORAL_TABLET | Freq: Every day | ORAL | Status: DC

## 2016-05-18 MED ORDER — RIVAROXABAN 15 MG PO TABS
15.0000 mg | ORAL_TABLET | Freq: Two times a day (BID) | ORAL | Status: DC
  Administered 2016-05-18 – 2016-05-20 (×4): 15 mg via ORAL
  Filled 2016-05-18 (×4): qty 1

## 2016-05-18 MED ORDER — OXYCODONE HCL 5 MG PO TABS
5.0000 mg | ORAL_TABLET | ORAL | Status: DC | PRN
  Administered 2016-05-18 (×2): 5 mg via ORAL
  Filled 2016-05-18 (×2): qty 1

## 2016-05-18 MED ORDER — HEPARIN (PORCINE) IN NACL 100-0.45 UNIT/ML-% IJ SOLN
1200.0000 [IU]/h | INTRAMUSCULAR | Status: AC
  Administered 2016-05-18 (×2): 1200 [IU]/h via INTRAVENOUS
  Filled 2016-05-18: qty 250

## 2016-05-18 MED ORDER — LOPERAMIDE HCL 2 MG PO CAPS
2.0000 mg | ORAL_CAPSULE | ORAL | Status: DC | PRN
  Administered 2016-05-18: 2 mg via ORAL
  Filled 2016-05-18: qty 1

## 2016-05-18 MED ORDER — INSULIN ASPART 100 UNIT/ML ~~LOC~~ SOLN
0.0000 [IU] | Freq: Three times a day (TID) | SUBCUTANEOUS | Status: DC
  Administered 2016-05-19: 2 [IU] via SUBCUTANEOUS
  Administered 2016-05-19: 3 [IU] via SUBCUTANEOUS
  Administered 2016-05-19: 2 [IU] via SUBCUTANEOUS
  Administered 2016-05-20 (×2): 3 [IU] via SUBCUTANEOUS

## 2016-05-18 MED ORDER — INSULIN GLARGINE 100 UNIT/ML ~~LOC~~ SOLN
10.0000 [IU] | Freq: Every day | SUBCUTANEOUS | Status: DC
  Administered 2016-05-19 – 2016-05-20 (×2): 10 [IU] via SUBCUTANEOUS
  Filled 2016-05-18 (×2): qty 0.1

## 2016-05-18 MED ORDER — OXYCODONE HCL 5 MG PO TABS
10.0000 mg | ORAL_TABLET | ORAL | Status: DC | PRN
  Administered 2016-05-18 – 2016-05-20 (×12): 10 mg via ORAL
  Filled 2016-05-18 (×12): qty 2

## 2016-05-18 NOTE — Progress Notes (Signed)
ANTICOAGULATION CONSULT NOTE - F/u Consult  Pharmacy Consult for Xarelto Indication: pulmonary embolus  Allergies  Allergen Reactions  . Lisinopril Other (See Comments)    Inflammation, coughing  . Metformin And Related Diarrhea  . Reglan [Metoclopramide] Other (See Comments)    Pt stated having lock jaw as a side effect.   . Sulfa Antibiotics Itching  . Wellbutrin [Bupropion] Cough   Patient Measurements: Height: 5' 4"  (162.6 cm) Weight: 229 lb 0.9 oz (103.9 kg) IBW/kg (Calculated) : 54.7 Heparin Dosing Weight: 80.5 kg (used [place] nomogram)  Vital Signs: Temp: 97.7 F (36.5 C) (09/07 1505) Temp Source: Oral (09/07 1505) BP: 139/85 (09/07 1505) Pulse Rate: 73 (09/07 1505)  Labs:  Recent Labs  05/17/16 2031 05/17/16 2156 05/17/16 2230 05/18/16 0314 05/18/16 0505 05/18/16 0855 05/18/16 1213  HGB 14.6  --   --  12.7  --  12.2  --   HCT 44.5  --   --  40.1  --  39.5  --   PLT 272  --   --  248  --  229  --   APTT  --  24  --   --   --   --   --   LABPROT  --   --  13.2  --   --   --   --   INR  --   --  1.00  --   --   --   --   HEPARINUNFRC  --   --   --   --  0.87*  --  0.86*  CREATININE 1.32*  --   --   --   --  1.80*  --   TROPONINI  --   --   --  <0.03  --  <0.03  --    Estimated Creatinine Clearance: 42.9 mL/min (by C-G formula based on SCr of 1.8 mg/dL).  Medical History: Past Medical History:  Diagnosis Date  . Acid reflux   . Diabetes mellitus without complication ([place])   . High cholesterol   . Hx of chest tube placement right   . Hypertension   . Neuropathy ([place])   . Pneumonia    Medications:  Prescriptions Prior to Admission  Medication Sig Dispense Refill Last Dose  . amLODipine (NORVASC) 5 MG tablet Take 5 mg by mouth daily. Reported on 09/16/2015   3 months ago  . aspirin EC 81 MG tablet Take 81 mg by mouth daily.   2 weeks ago  . cholecalciferol (VITAMIN D) 1000 units tablet Take 1,000 Units by mouth 2 (two) times daily.   05/17/2016 at  1000  . ezetimibe-simvastatin (VYTORIN) 10-10 MG tablet Take 1 tablet by mouth daily.   2 weeks ago  . gabapentin (NEURONTIN) 400 MG capsule Take 400 mg by mouth 3 (three) times daily.   2 weeks ago  . glipiZIDE (GLUCOTROL) 10 MG tablet Take 10 mg by mouth 2 (two) times daily before a meal. Reported on 09/16/2015   05/17/2016 at 1000  . glucose monitoring kit (FREESTYLE) monitoring kit 1 each by Does not apply route 4 (four) times daily - after meals and at bedtime. 1 month Diabetic Testing Supplies for QAC-QHS accuchecks. 1 each 1 Not Taking  . hydrochlorothiazide (MICROZIDE) 12.5 MG capsule Take 12.5 mg by mouth daily.   05/17/2016 at 1000  . insulin aspart (NOVOLOG) 100 UNIT/ML injection Inject 5 Units into the skin 2 (two) times daily.   05/17/2016 at 1000  . insulin glargine (  LANTUS) 100 UNIT/ML injection Inject 20-65 Units into the skin 2 (two) times daily. Takes 20 units in the morning and 65 units at bedtime   05/17/2016 at 1000  . JANUVIA 100 MG tablet Take 100 mg by mouth 2 (two) times daily.   05/17/2016 at 1000  . loperamide (IMODIUM) 2 MG capsule Take 8 mg by mouth daily.   05/16/2016 at Unknown time  . MELATONIN PO Take 1 tablet by mouth daily.   05/16/2016 at Unknown time  . methocarbamol (ROBAXIN) 750 MG tablet Take 1,500 mg by mouth 2 (two) times daily as needed for muscle spasms.   05/17/2016 at Unknown time  . metoprolol tartrate (LOPRESSOR) 25 MG tablet Take 25 mg by mouth 2 (two) times daily.   05/17/2016 at 1000  . omeprazole (PRILOSEC) 40 MG capsule Take 40 mg by mouth daily.   05/17/2016 at Unknown time  . PARoxetine (PAXIL) 40 MG tablet Take 40 mg by mouth daily with breakfast.   05/17/2016 at 1000  . HYDROcodone-acetaminophen (NORCO/VICODIN) 5-325 MG tablet Take 1-2 tablets by mouth every 4 (four) hours as needed for moderate pain. (Patient not taking: Reported on 05/17/2016) 12 tablet 0 Not Taking at Unknown time  . predniSONE (DELTASONE) 10 MG tablet Take 2 tablets (20 mg total) by mouth daily.  (Patient not taking: Reported on 05/17/2016) 15 tablet 0 Not Taking at Unknown time   Scheduled:  . amLODipine  5 mg Oral Daily  . cholecalciferol  1,000 Units Oral BID  . ezetimibe  10 mg Oral q1800   And  . simvastatin  10 mg Oral q1800  . gabapentin  400 mg Oral TID  . glipiZIDE  10 mg Oral BID AC  . hydrochlorothiazide  12.5 mg Oral Daily  . insulin aspart  0-9 Units Subcutaneous TID WC  . insulin glargine  20 Units Subcutaneous Daily  . insulin glargine  65 Units Subcutaneous QHS  . linagliptin  5 mg Oral Daily  . metoprolol tartrate  25 mg Oral BID  . pantoprazole  40 mg Oral Daily  . PARoxetine  40 mg Oral Q breakfast  . Rivaroxaban  15 mg Oral BID WC  . [START ON 06/09/2016] rivaroxaban  20 mg Oral Q supper  . sodium chloride flush  3 mL Intravenous Q12H  . sodium chloride flush  3 mL Intravenous Q12H   Assessment: 15 yoF with Hx HTN, DM presents 05/17/16 with syncope; endorses recent SOB with exertion. D-dimer elevated; CT angio shows bilateral LL segmental/subsegmental PE w/o R heart strain.  Pharmacy consulted to start IV heparin.   Baseline INR, aPTT: pending  Prior anticoagulation: none, on ASA 81 PTA  Significant events: - bleeding around gums/teeth earlier today after striking jaw during fall, but this appears to be well controlled  Today, 05/18/2016:  Heparin level = 0.86 (supratherapeutic) following rate reduction this morning for supratherapeutic level.  Heparin infusing at ordered rate  CBC: hgb and pltc WNL  Transition to Xarelto today  Goal of Therapy: Heparin level 0.3-0.7 units/ml Monitor platelets by anticoagulation protocol: Yes  Plan:   Diiscontinue heparin infusion at 1700  Begin Xarelto 34m bid this afternoon x 21 days, then Xarelto 281mdaily beginning 9/29  Monitor for signs of bleeding or thrombosis  Xarelto Education  GrMinda DittoharmD Pager [phone]/03/2016, 4:02 PM

## 2016-05-18 NOTE — Progress Notes (Addendum)
ANTICOAGULATION CONSULT NOTE - F/u Consult  Pharmacy Consult for IV heparin Indication: pulmonary embolus  Allergies  Allergen Reactions  . Lisinopril Other (See Comments)    Inflammation, coughing  . Metformin And Related Diarrhea  . Reglan [Metoclopramide] Other (See Comments)    Pt stated having lock jaw as a side effect.   . Sulfa Antibiotics Itching  . Wellbutrin [Bupropion] Cough    Patient Measurements: Height: 5' 4"  (162.6 cm) Weight: 229 lb 0.9 oz (103.9 kg) IBW/kg (Calculated) : 54.7 Heparin Dosing Weight: 80.5 kg (used [place] nomogram)  Vital Signs: Temp: 98.1 F (36.7 C) (09/07 1018) Temp Source: Oral (09/07 1018) BP: 142/75 (09/07 1025) Pulse Rate: 74 (09/07 1025)  Labs:  Recent Labs  05/17/16 2031 05/17/16 2156 05/17/16 2230 05/18/16 0314 05/18/16 0505 05/18/16 0855 05/18/16 1213  HGB 14.6  --   --  12.7  --  12.2  --   HCT 44.5  --   --  40.1  --  39.5  --   PLT 272  --   --  248  --  229  --   APTT  --  24  --   --   --   --   --   LABPROT  --   --  13.2  --   --   --   --   INR  --   --  1.00  --   --   --   --   HEPARINUNFRC  --   --   --   --  0.87*  --  0.86*  CREATININE 1.32*  --   --   --   --  1.80*  --   TROPONINI  --   --   --  <0.03  --  <0.03  --     Estimated Creatinine Clearance: 42.9 mL/min (by C-G formula based on SCr of 1.8 mg/dL).   Medical History: Past Medical History:  Diagnosis Date  . Acid reflux   . Diabetes mellitus without complication ([place])   . High cholesterol   . Hx of chest tube placement right   . Hypertension   . Neuropathy ([place])   . Pneumonia     Medications:  Prescriptions Prior to Admission  Medication Sig Dispense Refill Last Dose  . amLODipine (NORVASC) 5 MG tablet Take 5 mg by mouth daily. Reported on 09/16/2015   3 months ago  . aspirin EC 81 MG tablet Take 81 mg by mouth daily.   2 weeks ago  . cholecalciferol (VITAMIN D) 1000 units tablet Take 1,000 Units by mouth 2 (two) times daily.    05/17/2016 at 1000  . ezetimibe-simvastatin (VYTORIN) 10-10 MG tablet Take 1 tablet by mouth daily.   2 weeks ago  . gabapentin (NEURONTIN) 400 MG capsule Take 400 mg by mouth 3 (three) times daily.   2 weeks ago  . glipiZIDE (GLUCOTROL) 10 MG tablet Take 10 mg by mouth 2 (two) times daily before a meal. Reported on 09/16/2015   05/17/2016 at 1000  . glucose monitoring kit (FREESTYLE) monitoring kit 1 each by Does not apply route 4 (four) times daily - after meals and at bedtime. 1 month Diabetic Testing Supplies for QAC-QHS accuchecks. 1 each 1 Not Taking  . hydrochlorothiazide (MICROZIDE) 12.5 MG capsule Take 12.5 mg by mouth daily.   05/17/2016 at 1000  . insulin aspart (NOVOLOG) 100 UNIT/ML injection Inject 5 Units into the skin 2 (two) times daily.   05/17/2016 at  1000  . insulin glargine (LANTUS) 100 UNIT/ML injection Inject 20-65 Units into the skin 2 (two) times daily. Takes 20 units in the morning and 65 units at bedtime   05/17/2016 at 1000  . JANUVIA 100 MG tablet Take 100 mg by mouth 2 (two) times daily.   05/17/2016 at 1000  . loperamide (IMODIUM) 2 MG capsule Take 8 mg by mouth daily.   05/16/2016 at Unknown time  . MELATONIN PO Take 1 tablet by mouth daily.   05/16/2016 at Unknown time  . methocarbamol (ROBAXIN) 750 MG tablet Take 1,500 mg by mouth 2 (two) times daily as needed for muscle spasms.   05/17/2016 at Unknown time  . metoprolol tartrate (LOPRESSOR) 25 MG tablet Take 25 mg by mouth 2 (two) times daily.   05/17/2016 at 1000  . omeprazole (PRILOSEC) 40 MG capsule Take 40 mg by mouth daily.   05/17/2016 at Unknown time  . PARoxetine (PAXIL) 40 MG tablet Take 40 mg by mouth daily with breakfast.   05/17/2016 at 1000  . HYDROcodone-acetaminophen (NORCO/VICODIN) 5-325 MG tablet Take 1-2 tablets by mouth every 4 (four) hours as needed for moderate pain. (Patient not taking: Reported on 05/17/2016) 12 tablet 0 Not Taking at Unknown time  . predniSONE (DELTASONE) 10 MG tablet Take 2 tablets (20 mg total) by  mouth daily. (Patient not taking: Reported on 05/17/2016) 15 tablet 0 Not Taking at Unknown time   Scheduled:  . amLODipine  5 mg Oral Daily  . cholecalciferol  1,000 Units Oral BID  . ezetimibe  10 mg Oral q1800   And  . simvastatin  10 mg Oral q1800  . gabapentin  400 mg Oral TID  . glipiZIDE  10 mg Oral BID AC  . hydrochlorothiazide  12.5 mg Oral Daily  . insulin glargine  20 Units Subcutaneous Daily  . insulin glargine  65 Units Subcutaneous QHS  . linagliptin  5 mg Oral Daily  . metoprolol tartrate  25 mg Oral BID  . pantoprazole  40 mg Oral Daily  . PARoxetine  40 mg Oral Q breakfast  . sodium chloride flush  3 mL Intravenous Q12H  . sodium chloride flush  3 mL Intravenous Q12H    Assessment: 58 yoF with Hx HTN, DM presents 05/17/16 with syncope; endorses recent SOB with exertion. D-dimer elevated; CT angio shows bilateral LL segmental/subsegmental PE w/o R heart strain.  Pharmacy consulted to start IV heparin.   Baseline INR, aPTT: pending  Prior anticoagulation: none, on ASA 81 PTA  Significant events: - bleeding around gums/teeth earlier today after striking jaw during fall, but this appears to be well controlled  Today, 05/18/2016:  Heparin level = 0.86 (supratherapeutic) following rate reduction this morning for supratherapeutic level.  Heparin infusing at ordered rate  CBC: hgb and pltc WNL  Goal of Therapy: Heparin level 0.3-0.7 units/ml Monitor platelets by anticoagulation protocol: Yes  Plan:   Decrease heparin drip to 1200 units/hr  Check heparin level 6 hrs after rate change  Daily CBC, daily heparin level once stable  Monitor for signs of bleeding or thrombosis  Likely change to xarelto tomorrow, on medicaid so cost should not be issue   Doreene Eland, PharmD, BCPS.   Pager: [phone] 05/18/2016 12:40 PM

## 2016-05-18 NOTE — Progress Notes (Addendum)
hypoglycemia  CBG 69. Juice given Recheck ____90_______

## 2016-05-18 NOTE — Plan of Care (Signed)
Problem: Physical Regulation: Goal: Will remain free from infection Outcome: Progressing Continue.   Problem: Nutrition: Goal: Adequate nutrition will be maintained Outcome: Progressing Pt with excellent PO intake.  Continue

## 2016-05-18 NOTE — Progress Notes (Addendum)
Patient ID: Tamara Henry, female   DOB: 09-Jun-1961, 55 y.o.   MRN: [place]:[id number]     PROGRESS NOTE    Tamara Henry  [id number] DOB: [birth year] DOA: 05/17/2016  PCP: Elbert Ewings, FNP   Brief Narrative:  55 y.o. female with medical history significant of GERD, type 2 diabetes, hyperlipidemia, hypertension, diabetic neuropathy, who presented to the emergency department after loosing consciousness at home in her bathroom. Prior to this episodes she reports have several episodes of watery diarrhea and some nausea but no vomiting. She did not eat much due to lack of appetite.   ED Course: D-dimer was 2.18 ug/mL, blood glucose was 152 and creatinine 1.32 mg/dL, The patient was started on heparin infusion after CT angiogram of the chest revealed bilateral pulmonary embolism.  Assessment & Plan:   Principal Problem:   Bilateral pulmonary embolism ([place]) - unclear what is the etiology of PE - she is currently on heparin drip and will transition to Xarelto - pt will need total of 6 months of anticoagulation therapy - stop heparin drip, troponins have been negative  - follow up on ECHO to rule out right heart strain   Active Problems:   Hypertension, essential  - reasonable inpatient control     Hyperlipidemia - continue statin per home medical regimen     Leukocytosis - reactive for acute bilateral PE - no need for abx at this time as no clear evidence of an infections etiology - CBC in AM    Acute on CKD stage II - review of records indicate baseline Cr in the past year has been ~ 1.2 - 1.3 - GFR in 50's - Cr is up now due to pre renal etiology in the setting of the acute event outlined above - will continue to trend Cr     Type 2 diabetes mellitus ([place]) with complications of nephropathy and neuropathy - HgA1C in may 2017 was 10.3 - will check A1C on this hospitalization  - continue with insulin lantus but lower the dose to 10 U due to hypoglycemia  - hold oral  antihyperglycemic regimen with glipizide and linagliptin until CBG stabilize     Syncope - suspect related to dehydration for diarrhea and acute PE - ambulate with assistance, PT eval requested     Diarrhea - improving, unclear if viral etiology - monitor for now     Obesity, morbid (meets criteria with BMI > 35 and underlying HLD, HTN, DM) - Body mass index is 39.32 kg/m.   DVT prophylaxis: change heparin to xarelto Code Status: Full  Family Communication: Patient at bedside  Disposition Plan: Home in AM if ECHO stable   Consultants:   None   Procedures:   ECHO   Antimicrobials:   None   Subjective: Some chest discomfort with deep breaths and left shoulder pain, not present this AM but was present yesterday evening.   Objective: Vitals:   05/18/16 1018 05/18/16 1025 05/18/16 1335 05/18/16 1505  BP: (!) 160/77 (!) 142/75 (!) 165/87 139/85  Pulse: 76 74 72 73  Resp: 20  16 18   Temp: 98.1 F (36.7 C)  98.1 F (36.7 C) 97.7 F (36.5 C)  TempSrc: Oral  Oral Oral  SpO2: 99%  100% 100%  Weight:      Height:        Intake/Output Summary (Last 24 hours) at 05/18/16 1513 Last data filed at 05/18/16 1330  Gross per 24 hour  Intake  1179.39 ml  Output              351 ml  Net           828.39 ml   Filed Weights   05/17/16 2151 05/17/16 2331 05/18/16 0500  Weight: 108.9 kg (240 lb) 103.9 kg (229 lb 0.9 oz) 103.9 kg (229 lb 0.9 oz)    Examination:  General exam: Appears calm and comfortable  Respiratory system: Respiratory effort normal. Diminished breath sounds at bases  Cardiovascular system: S1 & S2 heard, RRR. No JVD, rubs, gallops or clicks. No pedal edema. Gastrointestinal system: Abdomen is nondistended, soft and nontender. No organomegaly or masses felt. Normal bowel sounds heard. Central nervous system: Alert and oriented. No focal neurological deficits.  Data Reviewed: I have personally reviewed following labs and imaging  studies  CBC:  Recent Labs Lab 05/17/16 2031 05/18/16 0314 05/18/16 0855  WBC 10.6* 11.5* 10.6*  NEUTROABS 8.0*  --  6.9  HGB 14.6 12.7 12.2  HCT 44.5 40.1 39.5  MCV 90.1 90.3 91.6  PLT 272 248 [id number]   Basic Metabolic Panel:  Recent Labs Lab 05/17/16 2031 05/18/16 0855  NA 137 134*  K 4.2 4.0  CL 105 101  CO2 23 24  GLUCOSE 152* 115*  BUN 20 21*  CREATININE 1.32* 1.80*  CALCIUM 9.0 8.2*   Liver Function Tests:  Recent Labs Lab 05/17/16 2031 05/18/16 0855  AST 23 21  ALT 21 17  ALKPHOS 110 89  BILITOT 0.5 0.2*  PROT 8.2* 7.0  ALBUMIN 3.3* 2.7*   Coagulation Profile:  Recent Labs Lab 05/17/16 2230  INR 1.00   Cardiac Enzymes:  Recent Labs Lab 05/18/16 0314 05/18/16 0855  TROPONINI <0.03 <0.03   CBG:  Recent Labs Lab 05/18/16 0013 05/18/16 0741 05/18/16 0805 05/18/16 1126  GLUCAP 68 69 90 88   Urine analysis:    Component Value Date/Time   COLORURINE YELLOW 05/17/2016 1940   APPEARANCEUR CLEAR 05/17/2016 1940   LABSPEC 1.015 05/17/2016 1940   PHURINE 6.0 05/17/2016 1940   GLUCOSEU NEGATIVE 05/17/2016 1940   HGBUR TRACE (A) 05/17/2016 1940   BILIRUBINUR NEGATIVE 05/17/2016 1940   KETONESUR NEGATIVE 05/17/2016 1940   PROTEINUR >300 (A) 05/17/2016 1940   UROBILINOGEN 0.2 01/13/2015 2000   NITRITE NEGATIVE 05/17/2016 1940   LEUKOCYTESUR NEGATIVE 05/17/2016 1940   Radiology Studies: Dg Chest 2 View  Result Date: 05/17/2016 CLINICAL DATA:  Recent fall with chest pain EXAM: CHEST  2 VIEW COMPARISON:  None. FINDINGS: The heart size and mediastinal contours are within normal limits. Both lungs are clear. The visualized skeletal structures are unremarkable. IMPRESSION: No active cardiopulmonary disease. Electronically Signed   By: Inez Catalina M.D.   On: 05/17/2016 20:17   Ct Head Wo Contrast  Result Date: 05/17/2016 CLINICAL DATA:  55 year old female status post syncope and fall EXAM: CT HEAD WITHOUT CONTRAST CT MAXILLOFACIAL WITHOUT  CONTRAST TECHNIQUE: Multidetector CT imaging of the head and maxillofacial structures were performed using the standard protocol without intravenous contrast. Multiplanar CT image reconstructions of the maxillofacial structures were also generated. COMPARISON:  Prior CT scan of the cervical spine 01/13/2015 ; prior head CT 10/25/2012 FINDINGS: CT HEAD FINDINGS Brain: No evidence of acute infarction, hemorrhage, hydrocephalus, extra-axial collection or mass lesion/mass effect. Vascular: No hyperdense vessel or unexpected calcification. Skull: Normal. Negative for fracture or focal lesion. Hyperostosis frontalis interna. Sinuses/Orbits: No acute finding. Other: None CT MAXILLOFACIAL FINDINGS Osseous: The right maxillary central incisor is dislodged and there  are small adjacent fracture fragments from the injured alveolar ridge. No additional facial fracture identified. The mandible is intact. Orbits: Negative. No traumatic or inflammatory finding. Sinuses: Clear. Soft tissues: Focal edema of the right aspect of the lower lip. There are 3 high density fragments within the lower lip soft tissues measuring 6, 2 and 3 mm respectively. These fragments likely represent tooth fragments. Large carie in the right mandibular second molar. Additionally, the right maxillary second premolar is broken. Limited intracranial: No significant or unexpected finding. IMPRESSION: CT HEAD 1. Negative CT FACE 1. Anterior and superior displacement of the right maxillary central incisor with fracture of the anterior alveolar ridge. No additional facial fractures identified. 2. Soft tissue injury to the right lower lip which contains 3 high-density fragments favored to represent tooth fragments. The fragments range in size from 2-6 mm. 3. Broken versus carious right maxillary second premolar. 4. Carious right mandibular second molar. Electronically Signed   By: Jacqulynn Cadet M.D.   On: 05/17/2016 20:34   Ct Angio Chest Pe W And/or Wo  Contrast  Result Date: 05/17/2016 CLINICAL DATA:  55 year old female status post syncope and fall EXAM: CT ANGIOGRAPHY CHEST WITH CONTRAST TECHNIQUE: Multidetector CT imaging of the chest was performed using the standard protocol during bolus administration of intravenous contrast. Multiplanar CT image reconstructions and MIPs were obtained to evaluate the vascular anatomy. CONTRAST:  100 mL Isovue 370 COMPARISON:  Chest x-ray 05/17/2016 FINDINGS: Cardiovascular: Adequate opacification of the pulmonary arteries to the proximal subsegmental level. There are multifocal filling defects within right lower lobe segmental and subsegmental pulmonary arteries consistent with acute PE. On the left, there are also multiple filling defects in the segmental and subsegmental pulmonary arteries. No evidence of right heart strain. The RV/ LV ratio is normal. The heart itself is normal in size. No pericardial effusion. Atherosclerotic calcifications are present in the coronary arteries. Mediastinum/Nodes: Approximately 2.2 cm heterogeneous left thyroid nodule. No suspicious mediastinal or hilar adenopathy. Unremarkable thoracic aorta. Lungs/Pleura: The lungs are clear.  No pleural effusion. Upper Abdomen: Visualized upper abdominal organs are unremarkable. Musculoskeletal: No acute fracture or aggressive appearing lytic or blastic osseous lesion. Review of the MIP images confirms the above findings. IMPRESSION: 1. Positive for bilateral lower lobe segmental and subsegmental pulmonary emboli. No evidence of right heart strain. 2. Incidentally detected approximately 2.2 cm heterogeneous left thyroid nodule. This has been previously imaged and biopsied. 3. Coronary artery calcifications. Please note that although the presence of coronary artery calcium documents the presence of coronary artery disease, the severity of this disease and any potential stenosis cannot be assessed on this non-gated CT examination. Assessment for potential  risk factor modification, dietary therapy or pharmacologic therapy may be warranted, if clinically indicated. These results were called by telephone at the time of interpretation on 05/17/2016 at 9:35 pm to Dr. Delrae Rend , who verbally acknowledged these results. Electronically Signed   By: Jacqulynn Cadet M.D.   On: 05/17/2016 21:38   Ct Maxillofacial Wo Contrast  Result Date: 05/17/2016 CLINICAL DATA:  55 year old female status post syncope and fall EXAM: CT HEAD WITHOUT CONTRAST CT MAXILLOFACIAL WITHOUT CONTRAST TECHNIQUE: Multidetector CT imaging of the head and maxillofacial structures were performed using the standard protocol without intravenous contrast. Multiplanar CT image reconstructions of the maxillofacial structures were also generated. COMPARISON:  Prior CT scan of the cervical spine 01/13/2015 ; prior head CT 10/25/2012 FINDINGS: CT HEAD FINDINGS Brain: No evidence of acute infarction, hemorrhage, hydrocephalus, extra-axial collection or mass  lesion/mass effect. Vascular: No hyperdense vessel or unexpected calcification. Skull: Normal. Negative for fracture or focal lesion. Hyperostosis frontalis interna. Sinuses/Orbits: No acute finding. Other: None CT MAXILLOFACIAL FINDINGS Osseous: The right maxillary central incisor is dislodged and there are small adjacent fracture fragments from the injured alveolar ridge. No additional facial fracture identified. The mandible is intact. Orbits: Negative. No traumatic or inflammatory finding. Sinuses: Clear. Soft tissues: Focal edema of the right aspect of the lower lip. There are 3 high density fragments within the lower lip soft tissues measuring 6, 2 and 3 mm respectively. These fragments likely represent tooth fragments. Large carie in the right mandibular second molar. Additionally, the right maxillary second premolar is broken. Limited intracranial: No significant or unexpected finding. IMPRESSION: CT HEAD 1. Negative CT FACE 1. Anterior and superior  displacement of the right maxillary central incisor with fracture of the anterior alveolar ridge. No additional facial fractures identified. 2. Soft tissue injury to the right lower lip which contains 3 high-density fragments favored to represent tooth fragments. The fragments range in size from 2-6 mm. 3. Broken versus carious right maxillary second premolar. 4. Carious right mandibular second molar. Electronically Signed   By: Jacqulynn Cadet M.D.   On: 05/17/2016 20:34      Scheduled Meds: . amLODipine  5 mg Oral Daily  . cholecalciferol  1,000 Units Oral BID  . ezetimibe  10 mg Oral q1800   And  . simvastatin  10 mg Oral q1800  . gabapentin  400 mg Oral TID  . glipiZIDE  10 mg Oral BID AC  . hydrochlorothiazide  12.5 mg Oral Daily  . insulin glargine  20 Units Subcutaneous Daily  . insulin glargine  65 Units Subcutaneous QHS  . linagliptin  5 mg Oral Daily  . metoprolol tartrate  25 mg Oral BID  . pantoprazole  40 mg Oral Daily  . PARoxetine  40 mg Oral Q breakfast  . sodium chloride flush  3 mL Intravenous Q12H  . sodium chloride flush  3 mL Intravenous Q12H   Continuous Infusions: . heparin 1,200 Units/hr (05/18/16 1425)     LOS: 1 day    Time spent: 20 minutes    Faye Ramsay, MD [facility] Pager [phone]  If 7PM-7AM, please contact night-coverage [email or web address] Password [email or web address] 05/18/2016, 3:13 PM

## 2016-05-18 NOTE — Progress Notes (Signed)
Utilization Complete. 

## 2016-05-18 NOTE — Progress Notes (Signed)
  Echocardiogram 2D Echocardiogram has been performed.  Darlina Sicilian M 05/18/2016, 3:52 PM

## 2016-05-18 NOTE — Progress Notes (Signed)
VASCULAR LAB PRELIMINARY  PRELIMINARY  PRELIMINARY  PRELIMINARY  Carotid duplex completed.    Preliminary report:  Right - no evidence of significant  Stenosis. Vertebral artery flow is antegrade. Left - 1% to 39% ICA stenosis. Vertebral artery flow is antegrade    Rishit Burkhalter, RVS 05/18/2016 10:25 AM

## 2016-05-18 NOTE — Progress Notes (Signed)
Hypoglycemic Event  CBG: 60  Treatment: 15 GM carbohydrate snack  Symptoms: Hungry  Follow-up CBG: [id number] CBG Result:102  Possible Reasons for Event: Inadequate meal intake  Comments/MD notified: Pt RN notified    Royetta Crochet Lizandro Spellman

## 2016-05-18 NOTE — Progress Notes (Signed)
Pt had a CBG of 68, Called Dr. Olevia Bowens to make him aware as pt had a scheduled dose of lantus, MD stated hold lantus this evening as pt has been having some diarrhea and poor appetite.

## 2016-05-18 NOTE — Progress Notes (Signed)
Inpatient Diabetes Program Recommendations  AACE/ADA: New Consensus Statement on Inpatient Glycemic Control (2015)  Target Ranges:  Prepandial:   less than 140 mg/dL      Peak postprandial:   less than 180 mg/dL (1-2 hours)      Critically ill patients:  140 - 180 mg/dL   Results for Tamara Henry, Tamara Henry (MRN [place]:[id number]) as of 05/18/2016 08:59  Ref. Range 05/18/2016 00:13 05/18/2016 07:41 05/18/2016 08:05  Glucose-Capillary Latest Ref Range: 65 - 99 mg/dL 68 69 90    Admit with: Syncope/ Bilateral PEs  History: DM  Home DM Meds: Lantus 20 units AM/ 65 units PM       Novolog 5 units bid       Glipizide 10 mg bid       Januvia 100 mg bid  Current Insulin Orders: Lantus 20 units AM/ 65 units PM      Glipizide 10 mg bid      Tradjenta 5 mg daily      MD- Lantus 65 units held last PM per MD order to hold as CBG was 68 mg/dl at midnight.  Patient with Hypoglycemia again this AM.  Please consider the following in-hospital insulin adjustments:  1. Hold Glipizide for now until patient closer to discharge  2. Reduce Lantus to 1/3 total home dose for now: Lantus 7 units AM/ Lantus 21 units PM  3. Start Novolog Sensitive Correction Scale/ SSI (0-9 units) TID AC + HS      --Will follow patient during hospitalization--  Wyn Quaker RN, MSN, CDE Diabetes Coordinator Inpatient Glycemic Control Team Team Pager: [phone] (8a-5p)

## 2016-05-18 NOTE — Progress Notes (Addendum)
Hypoglycemic Event  CBG: 49  Treatment: 15 GM carbohydrate snack  Symptoms: Hungry  Follow-up CBG: Time:1631 CBG Result:60  Possible Reasons for Event: Inadequate meal intake  Comments/MD notified: Pt RN notified    Cathie Hoops

## 2016-05-18 NOTE — Progress Notes (Signed)
ANTICOAGULATION CONSULT NOTE - F/u Consult  Pharmacy Consult for IV heparin Indication: pulmonary embolus  Allergies  Allergen Reactions  . Lisinopril Other (See Comments)    Inflammation, coughing  . Metformin And Related Diarrhea  . Reglan [Metoclopramide] Other (See Comments)    Pt stated having lock jaw as a side effect.   . Sulfa Antibiotics Itching  . Wellbutrin [Bupropion] Cough    Patient Measurements: Height: 5' 4"  (162.6 cm) Weight: 229 lb 0.9 oz (103.9 kg) IBW/kg (Calculated) : 54.7 Heparin Dosing Weight: 80.5 kg (used [place] nomogram)  Vital Signs: Temp: 98.3 F (36.8 C) (09/07 0520) Temp Source: Oral (09/07 0520) BP: 159/84 (09/07 0520) Pulse Rate: 67 (09/07 0520)  Labs:  Recent Labs  05/17/16 2031 05/17/16 2156 05/17/16 2230 05/18/16 0314 05/18/16 0505  HGB 14.6  --   --  12.7  --   HCT 44.5  --   --  40.1  --   PLT 272  --   --  248  --   APTT  --  24  --   --   --   LABPROT  --   --  13.2  --   --   INR  --   --  1.00  --   --   HEPARINUNFRC  --   --   --   --  0.87*  CREATININE 1.32*  --   --   --   --   TROPONINI  --   --   --  <0.03  --     Estimated Creatinine Clearance: 58.6 mL/min (by C-G formula based on SCr of 1.32 mg/dL).   Medical History: Past Medical History:  Diagnosis Date  . Acid reflux   . Diabetes mellitus without complication ([place])   . High cholesterol   . Hx of chest tube placement right   . Hypertension   . Neuropathy ([place])   . Pneumonia     Medications:  Prescriptions Prior to Admission  Medication Sig Dispense Refill Last Dose  . amLODipine (NORVASC) 5 MG tablet Take 5 mg by mouth daily. Reported on 09/16/2015   3 months ago  . aspirin EC 81 MG tablet Take 81 mg by mouth daily.   2 weeks ago  . cholecalciferol (VITAMIN D) 1000 units tablet Take 1,000 Units by mouth 2 (two) times daily.   05/17/2016 at 1000  . ezetimibe-simvastatin (VYTORIN) 10-10 MG tablet Take 1 tablet by mouth daily.   2 weeks ago  .  gabapentin (NEURONTIN) 400 MG capsule Take 400 mg by mouth 3 (three) times daily.   2 weeks ago  . glipiZIDE (GLUCOTROL) 10 MG tablet Take 10 mg by mouth 2 (two) times daily before a meal. Reported on 09/16/2015   05/17/2016 at 1000  . glucose monitoring kit (FREESTYLE) monitoring kit 1 each by Does not apply route 4 (four) times daily - after meals and at bedtime. 1 month Diabetic Testing Supplies for QAC-QHS accuchecks. 1 each 1 Not Taking  . hydrochlorothiazide (MICROZIDE) 12.5 MG capsule Take 12.5 mg by mouth daily.   05/17/2016 at 1000  . insulin aspart (NOVOLOG) 100 UNIT/ML injection Inject 5 Units into the skin 2 (two) times daily.   05/17/2016 at 1000  . insulin glargine (LANTUS) 100 UNIT/ML injection Inject 20-65 Units into the skin 2 (two) times daily. Takes 20 units in the morning and 65 units at bedtime   05/17/2016 at 1000  . JANUVIA 100 MG tablet Take 100 mg by mouth  2 (two) times daily.   05/17/2016 at 1000  . loperamide (IMODIUM) 2 MG capsule Take 8 mg by mouth daily.   05/16/2016 at Unknown time  . MELATONIN PO Take 1 tablet by mouth daily.   05/16/2016 at Unknown time  . methocarbamol (ROBAXIN) 750 MG tablet Take 1,500 mg by mouth 2 (two) times daily as needed for muscle spasms.   05/17/2016 at Unknown time  . metoprolol tartrate (LOPRESSOR) 25 MG tablet Take 25 mg by mouth 2 (two) times daily.   05/17/2016 at 1000  . omeprazole (PRILOSEC) 40 MG capsule Take 40 mg by mouth daily.   05/17/2016 at Unknown time  . PARoxetine (PAXIL) 40 MG tablet Take 40 mg by mouth daily with breakfast.   05/17/2016 at 1000  . HYDROcodone-acetaminophen (NORCO/VICODIN) 5-325 MG tablet Take 1-2 tablets by mouth every 4 (four) hours as needed for moderate pain. (Patient not taking: Reported on 05/17/2016) 12 tablet 0 Not Taking at Unknown time  . predniSONE (DELTASONE) 10 MG tablet Take 2 tablets (20 mg total) by mouth daily. (Patient not taking: Reported on 05/17/2016) 15 tablet 0 Not Taking at Unknown time   Scheduled:  .  amLODipine  5 mg Oral Daily  . cholecalciferol  1,000 Units Oral BID  . ezetimibe  10 mg Oral q1800   And  . simvastatin  10 mg Oral q1800  . gabapentin  400 mg Oral TID  . glipiZIDE  10 mg Oral BID AC  . hydrochlorothiazide  12.5 mg Oral Daily  . insulin glargine  20 Units Subcutaneous Daily  . insulin glargine  65 Units Subcutaneous QHS  . linagliptin  5 mg Oral Daily  . metoprolol tartrate  25 mg Oral BID  . pantoprazole  40 mg Oral Daily  . PARoxetine  40 mg Oral Q breakfast  . sodium chloride flush  3 mL Intravenous Q12H  . sodium chloride flush  3 mL Intravenous Q12H    Assessment: 33 yoF with Hx HTN, DM presents 05/17/16 with syncope; endorses recent SOB with exertion. D-dimer elevated; CT angio shows bilateral LL segmental/subsegmental PE w/o R heart strain.  Pharmacy consulted to start IV heparin.   Baseline INR, aPTT: pending  Prior anticoagulation: none, on ASA 81 PTA  Significant events:  9/6:  CBC: wnl  No bleeding or infusion issues per nursing; note patient did have bleeding around gums/teeth earlier today after striking jaw during fall, but this appears to be well controlled  CrCl: 60 ml/min Today, 9/7  0505 HL=0.87 (drawn @ ~ 5 hours, no infusion problems, slight bleeding from gums still)  Goal of Therapy: Heparin level 0.3-0.7 units/ml Monitor platelets by anticoagulation protocol: Yes  Plan:   Decrease heparin drip to 1350 units/hr  Check heparin level 6 hrs after rate change  Daily CBC, daily heparin level once stable  Monitor for signs of bleeding or thrombosis    Dorrene German 05/18/2016, 5:51 AM

## 2016-05-19 ENCOUNTER — Inpatient Hospital Stay (HOSPITAL_COMMUNITY): Payer: Medicare Other

## 2016-05-19 DIAGNOSIS — I2699 Other pulmonary embolism without acute cor pulmonale: Principal | ICD-10-CM

## 2016-05-19 LAB — CBC WITH DIFFERENTIAL/PLATELET
BASOS ABS: 0 10*3/uL (ref 0.0–0.1)
Basophils Relative: 0 %
EOS PCT: 1 %
Eosinophils Absolute: 0 10*3/uL (ref 0.0–0.7)
HEMATOCRIT: 31.6 % — AB (ref 36.0–46.0)
Hemoglobin: 10.2 g/dL — ABNORMAL LOW (ref 12.0–15.0)
LYMPHS ABS: 1.6 10*3/uL (ref 0.7–4.0)
LYMPHS PCT: 20 %
MCH: 30.1 pg (ref 26.0–34.0)
MCHC: 32.3 g/dL (ref 30.0–36.0)
MCV: 93.2 fL (ref 78.0–100.0)
MONO ABS: 1.2 10*3/uL — AB (ref 0.1–1.0)
Monocytes Relative: 15 %
NEUTROS ABS: 5.4 10*3/uL (ref 1.7–7.7)
Neutrophils Relative %: 64 %
PLATELETS: 216 10*3/uL (ref 150–400)
RBC: 3.39 MIL/uL — AB (ref 3.87–5.11)
RDW: 17 % — ABNORMAL HIGH (ref 11.5–15.5)
WBC: 8.2 10*3/uL (ref 4.0–10.5)

## 2016-05-19 LAB — GLUCOSE, CAPILLARY
GLUCOSE-CAPILLARY: 207 mg/dL — AB (ref 65–99)
GLUCOSE-CAPILLARY: 193 mg/dL — AB (ref 65–99)
Glucose-Capillary: 245 mg/dL — ABNORMAL HIGH (ref 65–99)

## 2016-05-19 LAB — COMPREHENSIVE METABOLIC PANEL
ALT: 19 U/L (ref 14–54)
AST: 18 U/L (ref 15–41)
Albumin: 2.7 g/dL — ABNORMAL LOW (ref 3.5–5.0)
Alkaline Phosphatase: 96 U/L (ref 38–126)
Anion gap: 8 (ref 5–15)
BILIRUBIN TOTAL: 0.5 mg/dL (ref 0.3–1.2)
BUN: 24 mg/dL — AB (ref 6–20)
CALCIUM: 8.1 mg/dL — AB (ref 8.9–10.3)
CO2: 25 mmol/L (ref 22–32)
CREATININE: 1.98 mg/dL — AB (ref 0.44–1.00)
Chloride: 100 mmol/L — ABNORMAL LOW (ref 101–111)
GFR, EST AFRICAN AMERICAN: 32 mL/min — AB (ref >60)
GFR, EST NON AFRICAN AMERICAN: 28 mL/min — AB (ref >60)
Glucose, Bld: 183 mg/dL — ABNORMAL HIGH (ref 65–99)
Potassium: 4.6 mmol/L (ref 3.5–5.1)
Sodium: 133 mmol/L — ABNORMAL LOW (ref 135–145)
TOTAL PROTEIN: 7 g/dL (ref 6.5–8.1)

## 2016-05-19 LAB — HOMOCYSTEINE: Homocysteine: 10.9 umol/L (ref 0.0–15.0)

## 2016-05-19 LAB — PROTEIN C, TOTAL: Protein C, Total: 191 % — ABNORMAL HIGH (ref 60–150)

## 2016-05-19 MED ORDER — BLOOD GLUCOSE METER KIT: PACK | 0 refills | Status: DC

## 2016-05-19 MED ORDER — FREESTYLE SYSTEM KIT
1.0000 | PACK | Freq: Three times a day (TID) | 1 refills | Status: DC
Start: 2016-05-19 — End: 2016-05-19

## 2016-05-19 MED ORDER — RIVAROXABAN (XARELTO) VTE STARTER PACK (15 & 20 MG): ORAL_TABLET | ORAL | 0 refills | Status: DC

## 2016-05-19 MED ORDER — OXYCODONE HCL 10 MG PO TABS: 10.0000 mg | ORAL_TABLET | Freq: Four times a day (QID) | ORAL | 0 refills | Status: DC | PRN

## 2016-05-19 MED ORDER — INSULIN GLARGINE 100 UNIT/ML ~~LOC~~ SOLN
15.0000 [IU] | Freq: Two times a day (BID) | SUBCUTANEOUS | 11 refills | Status: DC
Start: 2016-05-19 — End: 2016-08-30

## 2016-05-19 MED ORDER — SODIUM CHLORIDE 0.9 % IV SOLN
INTRAVENOUS | Status: AC
  Administered 2016-05-19 – 2016-05-20 (×3): via INTRAVENOUS

## 2016-05-19 NOTE — Progress Notes (Signed)
Patient ID: Tamara Henry, female   DOB: [birth year], 55 y.o.   MRN: [id number]     PROGRESS NOTE    Tamara Henry  [id number] DOB: 09-Feb-1961 DOA: 05/17/2016  PCP: Tamara Ewings, FNP   Brief Narrative:  55 y.o. female with medical history significant of GERD, type 2 diabetes, hyperlipidemia, hypertension, diabetic neuropathy, who presented to the emergency department after loosing consciousness at home in her bathroom. Prior to this episodes she reports have several episodes of watery diarrhea and some nausea but no vomiting. She did not eat much due to lack of appetite.   ED Course: D-dimer was 2.18 ug/mL, blood glucose was 152 and creatinine 1.32 mg/dL, The patient was started on heparin infusion after CT angiogram of the chest revealed bilateral pulmonary embolism.  Assessment & Plan:   Principal Problem:   Bilateral pulmonary embolism ([place]) - unclear what is the etiology of PE - she is currently tolerating Xarelto - pt will need total of 6 months of anticoagulation therapy - ECHO no right heart strain   Active Problems:   Hypertension, essential  - reasonable inpatient control   Hyperlipidemia - continue statin per home medical regimen   Leukocytosis - reactive for acute bilateral PE - no need for abx at this time as no clear evidence of an infections etiology - CBC in AM  Acute on CKD stage II - review of records indicate baseline Cr in the past year has been ~ 1.2 - 1.3 - Cr is up now due to pre renal etiology in the setting of the acute event outlined above, but increasing today so discharge has been held - hydrate - check Renal US to rule out obstruction  Type 2 diabetes mellitus ([place]) with complications of nephropathy and neuropathy - HgA1C in may 2017 was 10.3 - will check A1C on this hospitalization  - continue with insulin lantus but lower the dose to 10 U BID due to hypoglycemia  - hold oral antihyperglycemic regimen with glipizide and linagliptin until  CBG stabilize   Syncope - suspect related to dehydration for diarrhea and acute PE - ambulate with assistance, PT eval requested   Diarrhea - improving, unclear if viral etiology - monitor for now   Obesity, morbid (meets criteria with BMI > 35 and underlying HLD, HTN, DM) - Body mass index is 39.32 kg/m.  DVT prophylaxis: now on Xarelto Code Status: Full  Family Communication: Patient at bedside   Disposition Plan: Home in AM if Cr improving  Consultants:   None   Procedures:   ECHO   Antimicrobials:   None  Subjective: Denies chest discomfort, shortness of breath. Lip swelling is improving.  Objective: Vitals:   05/18/16 2037 05/19/16 0400 05/19/16 0914 05/19/16 1320  BP: (!) 149/86 (!) 142/74 (!) 145/70   Pulse: 86 93 93   Resp: 18 18    Temp: 99.1 F (37.3 C) 99.4 F (37.4 C)    TempSrc: Oral Oral    SpO2: 93% 94%  93%  Weight:      Height:        Intake/Output Summary (Last 24 hours) at 05/19/16 1426 Last data filed at 05/19/16 1200  Gross per 24 hour  Intake              480 ml  Output              900 ml  Net             -420 ml  Filed Weights   05/17/16 2151 05/17/16 2331 05/18/16 0500  Weight: 108.9 kg (240 lb) 103.9 kg (229 lb 0.9 oz) 103.9 kg (229 lb 0.9 oz)    Examination: General exam: Appears calm and comfortable  HEENT: significat right lip swelling, improved per pateint, no throat swelling, stridor Respiratory system: Respiratory effort normal. Diminished breath sounds at bases  Cardiovascular system: S1 & S2 heard, RRR. No JVD, rubs, gallops or clicks. No pedal edema. Gastrointestinal system: Abdomen is nondistended, soft and nontender. No organomegaly or masses felt. Normal bowel sounds heard. Central nervous system: Alert and oriented. No focal neurological deficits.  Data Reviewed: I have personally reviewed following labs and imaging studies  CBC:  Recent Labs Lab 05/17/16 2031 05/18/16 0314 05/18/16 0855  05/19/16 0500  WBC 10.6* 11.5* 10.6* 8.2  NEUTROABS 8.0*  --  6.9 5.4  HGB 14.6 12.7 12.2 10.2*  HCT 44.5 40.1 39.5 31.6*  MCV 90.1 90.3 91.6 93.2  PLT 272 248 229 [id number]   Basic Metabolic Panel:  Recent Labs Lab 05/17/16 2031 05/18/16 0855 05/19/16 0658  NA 137 134* 133*  K 4.2 4.0 4.6  CL 105 101 100*  CO2 23 24 25   GLUCOSE 152* 115* 183*  BUN 20 21* 24*  CREATININE 1.32* 1.80* 1.98*  CALCIUM 9.0 8.2* 8.1*   Liver Function Tests:  Recent Labs Lab 05/17/16 2031 05/18/16 0855 05/19/16 0658  AST 23 21 18   ALT 21 17 19   ALKPHOS 110 89 96  BILITOT 0.5 0.2* 0.5  PROT 8.2* 7.0 7.0  ALBUMIN 3.3* 2.7* 2.7*   Coagulation Profile:  Recent Labs Lab 05/17/16 2230  INR 1.00   Cardiac Enzymes:  Recent Labs Lab 05/18/16 0314 05/18/16 0855  TROPONINI <0.03 <0.03   CBG:  Recent Labs Lab 05/18/16 1631 05/18/16 1647 05/18/16 2036 05/19/16 0757 05/19/16 1144  GLUCAP 60* 102* 87 207* 193*   Urine analysis:    Component Value Date/Time   COLORURINE YELLOW 05/17/2016 1940   APPEARANCEUR CLEAR 05/17/2016 1940   LABSPEC 1.015 05/17/2016 1940   PHURINE 6.0 05/17/2016 1940   GLUCOSEU NEGATIVE 05/17/2016 1940   HGBUR TRACE (A) 05/17/2016 1940   BILIRUBINUR NEGATIVE 05/17/2016 1940   KETONESUR NEGATIVE 05/17/2016 1940   PROTEINUR >300 (A) 05/17/2016 1940   UROBILINOGEN 0.2 01/13/2015 2000   NITRITE NEGATIVE 05/17/2016 1940   LEUKOCYTESUR NEGATIVE 05/17/2016 1940   Radiology Studies: Dg Chest 2 View  Result Date: 05/17/2016 CLINICAL DATA:  Recent fall with chest pain EXAM: CHEST  2 VIEW COMPARISON:  None. FINDINGS: The heart size and mediastinal contours are within normal limits. Both lungs are clear. The visualized skeletal structures are unremarkable. IMPRESSION: No active cardiopulmonary disease. Electronically Signed   By: Inez Catalina M.D.   On: 05/17/2016 20:17   Ct Head Wo Contrast  Result Date: 05/17/2016 CLINICAL DATA:  55 year old female status post  syncope and fall EXAM: CT HEAD WITHOUT CONTRAST CT MAXILLOFACIAL WITHOUT CONTRAST TECHNIQUE: Multidetector CT imaging of the head and maxillofacial structures were performed using the standard protocol without intravenous contrast. Multiplanar CT image reconstructions of the maxillofacial structures were also generated. COMPARISON:  Prior CT scan of the cervical spine 01/13/2015 ; prior head CT 10/25/2012 FINDINGS: CT HEAD FINDINGS Brain: No evidence of acute infarction, hemorrhage, hydrocephalus, extra-axial collection or mass lesion/mass effect. Vascular: No hyperdense vessel or unexpected calcification. Skull: Normal. Negative for fracture or focal lesion. Hyperostosis frontalis interna. Sinuses/Orbits: No acute finding. Other: None CT MAXILLOFACIAL FINDINGS Osseous: The right maxillary central  incisor is dislodged and there are small adjacent fracture fragments from the injured alveolar ridge. No additional facial fracture identified. The mandible is intact. Orbits: Negative. No traumatic or inflammatory finding. Sinuses: Clear. Soft tissues: Focal edema of the right aspect of the lower lip. There are 3 high density fragments within the lower lip soft tissues measuring 6, 2 and 3 mm respectively. These fragments likely represent tooth fragments. Large carie in the right mandibular second molar. Additionally, the right maxillary second premolar is broken. Limited intracranial: No significant or unexpected finding. IMPRESSION: CT HEAD 1. Negative CT FACE 1. Anterior and superior displacement of the right maxillary central incisor with fracture of the anterior alveolar ridge. No additional facial fractures identified. 2. Soft tissue injury to the right lower lip which contains 3 high-density fragments favored to represent tooth fragments. The fragments range in size from 2-6 mm. 3. Broken versus carious right maxillary second premolar. 4. Carious right mandibular second molar. Electronically Signed   By: Jacqulynn Cadet M.D.   On: 05/17/2016 20:34   Ct Angio Chest Pe W And/or Wo Contrast  Result Date: 05/17/2016 CLINICAL DATA:  55 year old female status post syncope and fall EXAM: CT ANGIOGRAPHY CHEST WITH CONTRAST TECHNIQUE: Multidetector CT imaging of the chest was performed using the standard protocol during bolus administration of intravenous contrast. Multiplanar CT image reconstructions and MIPs were obtained to evaluate the vascular anatomy. CONTRAST:  100 mL Isovue 370 COMPARISON:  Chest x-ray 05/17/2016 FINDINGS: Cardiovascular: Adequate opacification of the pulmonary arteries to the proximal subsegmental level. There are multifocal filling defects within right lower lobe segmental and subsegmental pulmonary arteries consistent with acute PE. On the left, there are also multiple filling defects in the segmental and subsegmental pulmonary arteries. No evidence of right heart strain. The RV/ LV ratio is normal. The heart itself is normal in size. No pericardial effusion. Atherosclerotic calcifications are present in the coronary arteries. Mediastinum/Nodes: Approximately 2.2 cm heterogeneous left thyroid nodule. No suspicious mediastinal or hilar adenopathy. Unremarkable thoracic aorta. Lungs/Pleura: The lungs are clear.  No pleural effusion. Upper Abdomen: Visualized upper abdominal organs are unremarkable. Musculoskeletal: No acute fracture or aggressive appearing lytic or blastic osseous lesion. Review of the MIP images confirms the above findings. IMPRESSION: 1. Positive for bilateral lower lobe segmental and subsegmental pulmonary emboli. No evidence of right heart strain. 2. Incidentally detected approximately 2.2 cm heterogeneous left thyroid nodule. This has been previously imaged and biopsied. 3. Coronary artery calcifications. Please note that although the presence of coronary artery calcium documents the presence of coronary artery disease, the severity of this disease and any potential stenosis  cannot be assessed on this non-gated CT examination. Assessment for potential risk factor modification, dietary therapy or pharmacologic therapy may be warranted, if clinically indicated. These results were called by telephone at the time of interpretation on 05/17/2016 at 9:35 pm to Dr. Delrae Rend , who verbally acknowledged these results. Electronically Signed   By: Jacqulynn Cadet M.D.   On: 05/17/2016 21:38   Ct Maxillofacial Wo Contrast  Result Date: 05/17/2016 CLINICAL DATA:  55 year old female status post syncope and fall EXAM: CT HEAD WITHOUT CONTRAST CT MAXILLOFACIAL WITHOUT CONTRAST TECHNIQUE: Multidetector CT imaging of the head and maxillofacial structures were performed using the standard protocol without intravenous contrast. Multiplanar CT image reconstructions of the maxillofacial structures were also generated. COMPARISON:  Prior CT scan of the cervical spine 01/13/2015 ; prior head CT 10/25/2012 FINDINGS: CT HEAD FINDINGS Brain: No evidence of acute infarction, hemorrhage,  hydrocephalus, extra-axial collection or mass lesion/mass effect. Vascular: No hyperdense vessel or unexpected calcification. Skull: Normal. Negative for fracture or focal lesion. Hyperostosis frontalis interna. Sinuses/Orbits: No acute finding. Other: None CT MAXILLOFACIAL FINDINGS Osseous: The right maxillary central incisor is dislodged and there are small adjacent fracture fragments from the injured alveolar ridge. No additional facial fracture identified. The mandible is intact. Orbits: Negative. No traumatic or inflammatory finding. Sinuses: Clear. Soft tissues: Focal edema of the right aspect of the lower lip. There are 3 high density fragments within the lower lip soft tissues measuring 6, 2 and 3 mm respectively. These fragments likely represent tooth fragments. Large carie in the right mandibular second molar. Additionally, the right maxillary second premolar is broken. Limited intracranial: No significant or  unexpected finding. IMPRESSION: CT HEAD 1. Negative CT FACE 1. Anterior and superior displacement of the right maxillary central incisor with fracture of the anterior alveolar ridge. No additional facial fractures identified. 2. Soft tissue injury to the right lower lip which contains 3 high-density fragments favored to represent tooth fragments. The fragments range in size from 2-6 mm. 3. Broken versus carious right maxillary second premolar. 4. Carious right mandibular second molar. Electronically Signed   By: Jacqulynn Cadet M.D.   On: 05/17/2016 20:34   Scheduled Meds: . amLODipine  5 mg Oral Daily  . cholecalciferol  1,000 Units Oral BID  . ezetimibe  10 mg Oral q1800   And  . simvastatin  10 mg Oral q1800  . gabapentin  400 mg Oral TID  . hydrochlorothiazide  12.5 mg Oral Daily  . insulin aspart  0-9 Units Subcutaneous TID WC  . insulin glargine  10 Units Subcutaneous Daily  . metoprolol tartrate  25 mg Oral BID  . pantoprazole  40 mg Oral Daily  . PARoxetine  40 mg Oral Q breakfast  . Rivaroxaban  15 mg Oral BID WC  . [START ON 06/09/2016] rivaroxaban  20 mg Oral Q supper  . sodium chloride flush  3 mL Intravenous Q12H  . sodium chloride flush  3 mL Intravenous Q12H   Continuous Infusions: . sodium chloride 100 mL/hr at 05/19/16 1349    LOS: 2 days   Time spent: 22 minutes    Tamara Eaves Marry Guan, MD [facility] Pager [phone]  If 7PM-7AM, please contact night-coverage [email or web address] Password [email or web address] 05/19/2016, 2:26 PM

## 2016-05-19 NOTE — Discharge Instructions (Signed)
Information on my medicine - XARELTO (rivaroxaban)  This medication education was reviewed with me or my healthcare representative as part of my discharge preparation.  The pharmacist that spoke with me during my hospital stay was:  Clovis Riley, [place]? Xarelto was prescribed to treat blood clots that may have been found in the veins of your legs (deep vein thrombosis) or in your lungs (pulmonary embolism) and to reduce the risk of them occurring again.  What do you need to know about Xarelto? The starting dose is one 15 mg tablet taken TWICE daily with food for the FIRST 21 DAYS then on September 29th  the dose is changed to one 20 mg tablet taken ONCE A DAY with your evening meal.  DO NOT stop taking Xarelto without talking to the health care provider who prescribed the medication.  Refill your prescription for 20 mg tablets before you run out.  After discharge, you should have regular check-up appointments with your healthcare provider that is prescribing your Xarelto.  In the future your dose may need to be changed if your kidney function changes by a significant amount.  What do you do if you miss a dose? If you are taking Xarelto TWICE DAILY and you miss a dose, take it as soon as you remember. You may take two 15 mg tablets (total 30 mg) at the same time then resume your regularly scheduled 15 mg twice daily the next day.  If you are taking Xarelto ONCE DAILY and you miss a dose, take it as soon as you remember on the same day then continue your regularly scheduled once daily regimen the next day. Do not take two doses of Xarelto at the same time.   Important Safety Information Xarelto is a blood thinner medicine that can cause bleeding. You should call your healthcare provider right away if you experience any of the following: ? Bleeding from an injury or your nose that does not stop. ? Unusual colored urine (red or dark brown) or  unusual colored stools (red or black). ? Unusual bruising for unknown reasons. ? A serious fall or if you hit your head (even if there is no bleeding).  Some medicines may interact with Xarelto and might increase your risk of bleeding while on Xarelto. To help avoid this, consult your healthcare provider or pharmacist prior to using any new prescription or non-prescription medications, including herbals, vitamins, non-steroidal anti-inflammatory drugs (NSAIDs) and supplements.  This website has more information on Xarelto: [email or web address]

## 2016-05-19 NOTE — Plan of Care (Signed)
Problem: Pain Managment: Goal: General experience of comfort will improve Continue.    Problem: Activity: Goal: Risk for activity intolerance will decrease Continue.   Problem: Nutrition: Goal: Adequate nutrition will be maintained Outcome: Completed/Met Date Met: 05/19/16 .

## 2016-05-19 NOTE — Care Management Note (Signed)
Case Management Note  Patient Details  Name: Tamara Henry MRN: [id number] Date of Birth: 31-Jul-1961  Subjective/Objective:  Nsg asked CM to talk to patient about d/c plans.  Patient has pcp, a+0x3,can, & will call to change her current pcp appt.MD paged to provide script for glucometer,strips,lancets, provided patient w/dental listing. Patient voiced understanding. MD updated.No further CM needs.                  Action/Plan:d/c home no needs or orders.   Expected Discharge Date:                  Expected Discharge Plan:  Home/Self Care  In-House Referral:     Discharge planning Services  CM Consult  Post Acute Care Choice:    Choice offered to:     DME Arranged:    DME Agency:     HH Arranged:    [place] Agency:     Status of Service:  Completed, signed off  If discussed at [employer] of Stay Meetings, dates discussed:    Additional Comments:  Dessa Phi, RN 05/19/2016, 12:09 PM

## 2016-05-19 NOTE — Evaluation (Signed)
Physical Therapy Evaluation Patient Details Name: Saidie Layer MRN: [id number] DOB: 1961-08-28 Today's Date: 05/19/2016   History of Present Illness  55 yo female admitted with bil PE, syncope. Hx of DM, HTN, neuropathy.   Clinical Impression  On eval, pt required Min guard assist for mobility. She walked ~200 feet with a RW. Pt denied dizziness but she did c/o fatigue. Recommend daily ambulation in hallway with nursing supervision. Recommend HHPT follow up and RW use for ambulation until stability improves.     Follow Up Recommendations Home health PT;Supervision for mobility/OOB    Equipment Recommendations  Rolling walker with 5" wheels    Recommendations for Other Services       Precautions / Restrictions Precautions Precautions: Fall Restrictions Weight Bearing Restrictions: No      Mobility  Bed Mobility Overal bed mobility: Modified Independent                Transfers Overall transfer level: Modified independent                  Ambulation/Gait Ambulation/Gait assistance: Min guard Ambulation Distance (Feet): 200 Feet Assistive device: Rolling walker (2 wheeled) Gait Pattern/deviations: Step-through pattern     General Gait Details: close guard for safety. Also followed with recliner for safety. Pt denied dizziness. She did c/o fatigue.  Walked a short distance in room with 1 HHA. Pt was unsteady and also noted she was reaching out for other objects in environment to hold on to. Stability improved with RW use.   Stairs            Wheelchair Mobility    Modified Rankin (Stroke Patients Only)       Balance Overall balance assessment: Needs assistance;History of Falls         Standing balance support: Single extremity supported Standing balance-Leahy Scale: Poor Standing balance comment: Unsteady without at least 1 UE support                             Pertinent Vitals/Pain Pain Assessment: 0-10 Pain Score: 9  Pain  Location: face, lips Pain Descriptors / Indicators: Sore Pain Intervention(s): Monitored during session;Repositioned    Home Living Family/patient expects to be discharged to:: Private residence Living Arrangements: Children Available Help at Discharge: Family Type of Home: Mobile home Home Access: Stairs to enter Entrance Stairs-Rails: Right Entrance Stairs-Number of Steps: 4   Home Equipment: None      Prior Function Level of Independence: Independent               Hand Dominance        Extremity/Trunk Assessment   Upper Extremity Assessment: Overall WFL for tasks assessed           Lower Extremity Assessment: Overall WFL for tasks assessed      Cervical / Trunk Assessment: Normal  Communication   Communication: No difficulties  Cognition Arousal/Alertness: Awake/alert Behavior During Therapy: WFL for tasks assessed/performed Overall Cognitive Status: Within Functional Limits for tasks assessed                      General Comments      Exercises        Assessment/Plan    PT Assessment Patient needs continued PT services  PT Diagnosis Difficulty walking;Generalized weakness;Acute pain   PT Problem List Decreased strength;Decreased activity tolerance;Decreased balance;Decreased mobility;Decreased knowledge of use of DME;Pain  PT Treatment Interventions DME instruction;Gait  training;Functional mobility training;Therapeutic activities;Therapeutic exercise;Balance training;Patient/family education   PT Goals (Current goals can be found in the Care Plan section) Acute Rehab PT Goals Patient Stated Goal: to regain independence PT Goal Formulation: With patient Time For Goal Achievement: 06/02/16 Potential to Achieve Goals: Good    Frequency Min 3X/week   Barriers to discharge        Co-evaluation               End of Session Equipment Utilized During Treatment: Gait belt Activity Tolerance: Patient tolerated treatment  well Patient left: in bed;with call bell/phone within reach;with bed alarm set;with family/visitor present           Time: [id number] PT Time Calculation (min) (ACUTE ONLY): 18 min   Charges:   PT Evaluation $PT Eval Low Complexity: 1 Procedure     PT G Codes:        Weston Anna, MPT Pager: [phone]

## 2016-05-20 LAB — BETA-2-GLYCOPROTEIN I ABS, IGG/M/A
Beta-2 Glyco I IgG: <9 GPI IgG units (ref 0–20)
Beta-2-Glycoprotein I IgM: <9 GPI IgM units (ref 0–32)

## 2016-05-20 LAB — PROTEIN C ACTIVITY

## 2016-05-20 LAB — GLUCOSE, CAPILLARY
GLUCOSE-CAPILLARY: 193 mg/dL — AB (ref 65–99)
Glucose-Capillary: 236 mg/dL — ABNORMAL HIGH (ref 65–99)
Glucose-Capillary: 245 mg/dL — ABNORMAL HIGH (ref 65–99)

## 2016-05-20 LAB — HEMOGLOBIN A1C
HEMOGLOBIN A1C: 9.9 % — AB (ref 4.8–5.6)
MEAN PLASMA GLUCOSE: 237 mg/dL

## 2016-05-20 LAB — BASIC METABOLIC PANEL
ANION GAP: 7 (ref 5–15)
BUN: 29 mg/dL — ABNORMAL HIGH (ref 6–20)
CO2: 23 mmol/L (ref 22–32)
Calcium: 8.2 mg/dL — ABNORMAL LOW (ref 8.9–10.3)
Chloride: 100 mmol/L — ABNORMAL LOW (ref 101–111)
Creatinine, Ser: 1.52 mg/dL — ABNORMAL HIGH (ref 0.44–1.00)
GFR calc Af Amer: 44 mL/min — ABNORMAL LOW (ref >60)
GFR calc non Af Amer: 38 mL/min — ABNORMAL LOW (ref >60)
GLUCOSE: 253 mg/dL — AB (ref 65–99)
POTASSIUM: 4.4 mmol/L (ref 3.5–5.1)
Sodium: 130 mmol/L — ABNORMAL LOW (ref 135–145)

## 2016-05-20 LAB — CARDIOLIPIN ANTIBODIES, IGG, IGM, IGA

## 2016-05-20 LAB — DRVVT MIX: dRVVT Mix: 47 s (ref 0.0–47.0)

## 2016-05-20 LAB — LUPUS ANTICOAGULANT PANEL
DRVVT: 47.4 s — ABNORMAL HIGH (ref 0.0–47.0)
PTT Lupus Anticoagulant: 31 s (ref 0.0–51.9)

## 2016-05-20 LAB — PROTEIN S ACTIVITY: PROTEIN S ACTIVITY: 122 % (ref 63–140)

## 2016-05-20 LAB — PROTEIN S, TOTAL

## 2016-05-20 MED ORDER — INSULIN GLARGINE 100 UNIT/ML ~~LOC~~ SOLN: 15.0000 [IU] | Freq: Every day | SUBCUTANEOUS | Status: DC

## 2016-05-20 NOTE — Care Management Important Message (Signed)
Important Message  Patient Details  Name: Tinlee Sessum MRN: [id number] Date of Birth: 1960/10/06   Medicare Important Message Given:  Yes    Erenest Rasher, RN 05/20/2016, 3:20 PM

## 2016-05-20 NOTE — Progress Notes (Signed)
Patient's daughter stated she was told by RN yesterday that her mother's Rx for Xarelto was 'at [facility]'.  Patient's daughter was very frustrated that she drove to 'every pharmacy on [facility] and no pharmacy has a Rx for her'.  I spoke with pharmacist who clarified that the Xarelto starter kit was only available at the [facility] which closes at 6pm today and at the [facility] off [address] after that time.  Informed patient's daughter of this.  Patient left unit after receiving discharge instructions and [place] [facility] prior to speaking with CM and receiving information and financial assistance from CM.  CM spoke with patient's daughter who stated patient's son had dropped off Rx at [facility] but was unable to pick them up prior to the pharmacy closing at [place] volunteered to pick up Rx for patient so she would have them tonight.  This RN witnessed exchange of patient's medications with daughter and reiterated patient's medication instructions to patient as CM witnessed.  Patient's daughter left with patient's medications with instructions to call with any questions and follow-up with PCP.

## 2016-05-20 NOTE — Care Management Note (Addendum)
Case Management Note  Patient Details  Name: Tamara Henry MRN: [id number] Date of Birth: July 25, 1961  Subjective/Objective:                    Action/Plan: Discharge Planning: AVS reviewed:  1520 NCM spoke to pt. Pt gave permission to speak to Tamara Henry # [phone]. Offered choice/HH list provided for [facility]. Agreeable to [facility] for [facility]. Requesting RW and tub bench for home. Contacted [facility] for [facility], and DME for home. Dtr states she was told Unit RN Tamara Henry to go to pharmacy on [facility]. States she went to every pharmacy looking for Xarelto Rx. Pt's son scheduled to pick up at dc. Dtr states she has appt on 05/22/2016 with new PCP, Tamara Henry. Will fax dc summary and  PCS application to PCP's office.   [place] to provided information on Xarelto 30 day free trial card. Pt's son came to pharmacy but did not have money to pay copay. He left Rx at [facility] to dtr and states she will not be able to make it back to pick up meds from [facility] before 6 pm. She lives in [place]. NCM went to [facility] to pick up meds for dtr.   1817 Dtr pick up meds from Unit and Unit RN and NCM went over with dtr. Dtr was given Xarelto, Insulin and Oxycodone Rx. Meds totalled $4.02.    Expected Discharge Date:  05/20/2016               Expected Discharge Plan:  [place]  In-House Referral:  NA  Discharge planning Services  CM Consult  Post Acute Care Choice:  Home Health Choice offered to:  Adult Children  DME Arranged:  3-N-1, [place] rolling DME Agency:  [place] Arranged:  PT, OT [place] Agency:  [place]  Status of Service:  Completed, signed off  If discussed at [place] of Stay Meetings, dates discussed:    Additional Comments:  Erenest Rasher, RN 05/20/2016, 6:03 PM

## 2016-05-20 NOTE — Discharge Summary (Signed)
Discharge Summary  Tamara Henry [id number]:[id number] DOB: June 17, 1961  PCP: Elbert Ewings, FNP  Admit date: 05/17/2016 Discharge date: 05/20/2016   Recommendations for Outpatient Follow-up:  1. PCP 1-2 weeks 2. Dental follow up within 2 weeks, given list of dentists.   Discharge Diagnoses:  Active Hospital Problems   Diagnosis Date Noted  . Bilateral pulmonary embolism ([place]) 05/17/2016  . Syncope 05/18/2016  . Hyperlipidemia 05/17/2016  . Depression 05/17/2016  . Diabetic peripheral neuropathy ([place]) 05/17/2016  . Type 2 diabetes mellitus ([place]) 05/17/2016  . Hypertension 01/14/2015    Resolved Hospital Problems   Diagnosis Date Noted Date Resolved  No resolved problems to display.    Discharge Condition: Stable   Diet recommendation: Diabetic.   Vitals:   05/19/16 2206 05/20/16 0444  BP: 134/76 127/72  Pulse: 96 (!) 106  Resp: 20 16  Temp: 99.9 F (37.7 C) 99.9 F (37.7 C)    History of present illness:  55 y.o.femalewith medical history significant of GERD, type 2 diabetes, hyperlipidemia, hypertension, diabetic neuropathy, who presented to the emergency department after loosing consciousness at home in her bathroom. Prior to this episodes she reports have several episodes of watery diarrhea and some nausea but no vomiting. She did not eat much due to lack of appetite. She was found to have submassive bilateral PE of unclear etiology.   Hospital Course:  Principal Problem:   Bilateral pulmonary embolism (HCC) Active Problems:   Hypertension   Hyperlipidemia   Depression   Diabetic peripheral neuropathy (HCC)   Type 2 diabetes mellitus ([place])   Syncope  Bilateral pulmonary embolism (HCC) - unclear what is the etiology of PE - initially treated with heparin drip and was transition to Xarelto - pt will need total of 6 months of anticoagulation therapy - ECHO to ruled out right heart strain   Active Problems: Hypertension, essential  - reasonable inpatient  control   Hyperlipidemia - continue statin per home medical regimen   Leukocytosis - reactive for acute bilateral PE - no need for abx at this time as no clear evidence of an infections etiology  Acute on CKD stage II - review of records indicate baseline Cr in the past year has been ~ 1.2 - 1.3 - GFR in 50's - Cr is up now due to pre renal etiology in the setting of the acute event outlined above - making good urine, Cr improved today with hydration. 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  Procedures:  Echo  CT PE protocol   Consultations:  None    Discharge Exam: BP 127/72 (BP Location: Right Arm)   Pulse (!) 106   Temp 99.9 F (37.7 C) (Oral)   Resp 16   Ht 5' 4"  (1.626 m)   Wt 108.9 kg (240 lb 1.3 oz)   LMP 04/26/2016 Comment: U preg neg 05/17/16  SpO2 93%   BMI 41.21 kg/m  General:  Alert,  oriented, calm, in no acute distress  HEENT: Lip swelling better once again today. Eyes: pupils round and reactive to light and accomodation, clear sclerea Neck: supple, no masses, trachea mildline  Cardiovascular: RRR, no murmurs or rubs, no peripheral edema  Respiratory: clear to auscultation bilaterally, no wheezes, no crackles  Abdomen: soft, nontender, nondistended, normal bowel tones heard  Skin: dry, no rashes  Musculoskeletal: no joint effusions, normal range of motion    Psychiatric: appropriate affect, normal speech  Neurologic: extraocular muscles intact, clear speech, moving all extremities with intact sensorium    Discharge Instructions You were cared for by a hospitalist during your hospital stay. If you have any questions about your discharge medications or the care you received while you were in the hospital after you are discharged, you can call the unit and asked to speak with the hospitalist on call if the hospitalist that took care of you is not available. Once you are discharged, your primary care physician will handle any further medical issues. Please note that NO REFILLS for any discharge medications will be authorized once you are discharged, as it is imperative that you return to your primary care physician (or establish a relationship with a primary care physician if you do not have one) for your aftercare needs so that they can reassess your need for medications and monitor your lab values.  Discharge Instructions    Diet - low sodium heart healthy    Complete by:  As directed   Diet - low sodium heart healthy    Complete by:  As directed   Increase activity slowly    Complete by:  As directed   Increase activity slowly    Complete by:  As directed       Medication List    STOP taking these medications   aspirin EC 81 MG tablet   glipiZIDE 10 MG tablet Commonly known as:  GLUCOTROL   glucose monitoring kit monitoring kit   HYDROcodone-acetaminophen 5-325 MG tablet Commonly known as:  NORCO/VICODIN   JANUVIA 100 MG tablet Generic drug:  sitaGLIPtin   predniSONE 10 MG tablet Commonly known as:  DELTASONE     TAKE these medications   amLODipine 5 MG tablet Commonly known as:  NORVASC Take 5 mg by mouth daily. Reported on 09/16/2015   blood glucose meter kit and supplies Dispense based on patient and insurance preference. Use four times with each meal and at bedtime. (FOR ICD-9 250.00, 250.01).   cholecalciferol 1000 units  tablet Commonly known as:  VITAMIN D Take 1,000 Units by mouth 2 (two) times daily.   ezetimibe-simvastatin 10-10 MG tablet Commonly known as:  VYTORIN Take 1 tablet by mouth daily.   gabapentin 400 MG capsule Commonly known as:  NEURONTIN Take 400 mg by mouth 3 (three) times daily.   hydrochlorothiazide 12.5 MG capsule Commonly known as:  MICROZIDE Take 12.5 mg by mouth daily.   insulin aspart 100 UNIT/ML injection Commonly known as:  novoLOG Inject 5 Units into the skin 2 (two) times daily.   insulin glargine 100 UNIT/ML injection Commonly known as:  LANTUS Inject 0.15 mLs (15 Units total) into the skin 2 (two) times daily. What changed:  how much to take  additional instructions   loperamide 2 MG capsule Commonly known as:  IMODIUM Take 8 mg by mouth daily.   MELATONIN PO Take 1 tablet by mouth daily.   methocarbamol 750 MG tablet Commonly known as:  ROBAXIN Take 1,500 mg by mouth  2 (two) times daily as needed for muscle spasms.   metoprolol tartrate 25 MG tablet Commonly known as:  LOPRESSOR Take 25 mg by mouth 2 (two) times daily.   omeprazole 40 MG capsule Commonly known as:  PRILOSEC Take 40 mg by mouth daily.   Oxycodone HCl 10 MG Tabs Take 1 tablet (10 mg total) by mouth every 6 (six) hours as needed for moderate pain.   PARoxetine 40 MG tablet Commonly known as:  PAXIL Take 40 mg by mouth daily with breakfast.   Rivaroxaban 15 & 20 MG Tbpk Take as directed on package: Start with one 14m tablet by mouth twice a day with food. On Day 22, switch to one 266mtablet once a day with food.      Allergies  Allergen Reactions  . Lisinopril Other (See Comments)    Inflammation, coughing  . Metformin And Related Diarrhea  . Reglan [Metoclopramide] Other (See Comments)    Pt stated having lock jaw as a side effect.   . Sulfa Antibiotics Itching  . Wellbutrin [Bupropion] Cough   Follow-up Information    AnElbert EwingsFNP. Call in 2 week(s).     Specialty:  Nurse Practitioner Why:  Call to get appt within 2 weeks due to new blood thinner and need for dental referral. Also need repeat BMP due to acute on chronic elevation in Creatinine. Contact information: [phone]          The results of significant diagnostics from this hospitalization (including imaging, microbiology, ancillary and laboratory) are listed below for reference.    Significant Diagnostic Studies: Dg Chest 2 View  Result Date: 05/17/2016 CLINICAL DATA:  Recent fall with chest pain EXAM: CHEST  2 VIEW COMPARISON:  None. FINDINGS: The heart size and mediastinal contours are within normal limits. Both lungs are clear. The visualized skeletal structures are unremarkable. IMPRESSION: No active cardiopulmonary disease. Electronically Signed   By: MaInez Catalina.D.   On: 05/17/2016 20:17   Ct Head Wo Contrast  Result Date: 05/17/2016 CLINICAL DATA:  [age]ear old female status post syncope and fall EXAM: CT HEAD WITHOUT CONTRAST CT MAXILLOFACIAL WITHOUT CONTRAST TECHNIQUE: Multidetector CT imaging of the head and maxillofacial structures were performed using the standard protocol without intravenous contrast. Multiplanar CT image reconstructions of the maxillofacial structures were also generated. COMPARISON:  Prior CT scan of the cervical spine 01/13/2015 ; prior head CT 10/25/2012 FINDINGS: CT HEAD FINDINGS Brain: No evidence of acute infarction, hemorrhage, hydrocephalus, extra-axial collection or mass lesion/mass effect. Vascular: No hyperdense vessel or unexpected calcification. Skull: Normal. Negative for fracture or focal lesion. Hyperostosis frontalis interna. Sinuses/Orbits: No acute finding. Other: None CT MAXILLOFACIAL FINDINGS Osseous: The right maxillary central incisor is dislodged and there are small adjacent fracture fragments from the injured alveolar ridge. No additional facial fracture identified. The  mandible is intact. Orbits: Negative. No traumatic or inflammatory finding. Sinuses: Clear. Soft tissues: Focal edema of the right aspect of the lower lip. There are 3 high density fragments within the lower lip soft tissues measuring 6, 2 and 3 mm respectively. These fragments likely represent tooth fragments. Large carie in the right mandibular second molar. Additionally, the right maxillary second premolar is broken. Limited intracranial: No significant or unexpected finding. IMPRESSION: CT HEAD 1. Negative CT FACE 1. Anterior and superior displacement of the right maxillary central incisor with fracture of the anterior alveolar ridge. No additional facial fractures identified. 2. Soft tissue  injury to the right lower lip which contains 3 high-density fragments favored to represent tooth fragments. The fragments range in size from 2-6 mm. 3. Broken versus carious right maxillary second premolar. 4. Carious right mandibular second molar. Electronically Signed   By: Jacqulynn Cadet M.D.   On: 05/17/2016 20:34   Ct Angio Chest Pe W And/or Wo Contrast  Result Date: 05/17/2016 CLINICAL DATA:  55 year old female status post syncope and fall EXAM: CT ANGIOGRAPHY CHEST WITH CONTRAST TECHNIQUE: Multidetector CT imaging of the chest was performed using the standard protocol during bolus administration of intravenous contrast. Multiplanar CT image reconstructions and MIPs were obtained to evaluate the vascular anatomy. CONTRAST:  100 mL Isovue 370 COMPARISON:  Chest x-ray 05/17/2016 FINDINGS: Cardiovascular: Adequate opacification of the pulmonary arteries to the proximal subsegmental level. There are multifocal filling defects within right lower lobe segmental and subsegmental pulmonary arteries consistent with acute PE. On the left, there are also multiple filling defects in the segmental and subsegmental pulmonary arteries. No evidence of right heart strain. The RV/ LV ratio is normal. The heart itself is normal in  size. No pericardial effusion. Atherosclerotic calcifications are present in the coronary arteries. Mediastinum/Nodes: Approximately 2.2 cm heterogeneous left thyroid nodule. No suspicious mediastinal or hilar adenopathy. Unremarkable thoracic aorta. Lungs/Pleura: The lungs are clear.  No pleural effusion. Upper Abdomen: Visualized upper abdominal organs are unremarkable. Musculoskeletal: No acute fracture or aggressive appearing lytic or blastic osseous lesion. Review of the MIP images confirms the above findings. IMPRESSION: 1. Positive for bilateral lower lobe segmental and subsegmental pulmonary emboli. No evidence of right heart strain. 2. Incidentally detected approximately 2.2 cm heterogeneous left thyroid nodule. This has been previously imaged and biopsied. 3. Coronary artery calcifications. Please note that although the presence of coronary artery calcium documents the presence of coronary artery disease, the severity of this disease and any potential stenosis cannot be assessed on this non-gated CT examination. Assessment for potential risk factor modification, dietary therapy or pharmacologic therapy may be warranted, if clinically indicated. These results were called by telephone at the time of interpretation on 05/17/2016 at 9:35 pm to Dr. Delrae Rend , who verbally acknowledged these results. Electronically Signed   By: Jacqulynn Cadet M.D.   On: 05/17/2016 21:38   US Renal  Result Date: 05/19/2016 CLINICAL DATA:  55 y/o  F; elevated creatinine. EXAM: RENAL / URINARY TRACT ULTRASOUND COMPLETE COMPARISON:  None. FINDINGS: Right Kidney: Length: 10.5. Echogenicity within normal limits. No mass or hydronephrosis visualized. Left Kidney: Length: 12.7. Echogenicity within normal limits. No mass or hydronephrosis visualized. Bladder: Appears normal for degree of bladder distention. IMPRESSION: Normal renal ultrasound. Electronically Signed   By: Kristine Garbe M.D.   On: 05/19/2016 15:12   Ct  Maxillofacial Wo Contrast  Result Date: 05/17/2016 CLINICAL DATA:  55 year old female status post syncope and fall EXAM: CT HEAD WITHOUT CONTRAST CT MAXILLOFACIAL WITHOUT CONTRAST TECHNIQUE: Multidetector CT imaging of the head and maxillofacial structures were performed using the standard protocol without intravenous contrast. Multiplanar CT image reconstructions of the maxillofacial structures were also generated. COMPARISON:  Prior CT scan of the cervical spine 01/13/2015 ; prior head CT 10/25/2012 FINDINGS: CT HEAD FINDINGS Brain: No evidence of acute infarction, hemorrhage, hydrocephalus, extra-axial collection or mass lesion/mass effect. Vascular: No hyperdense vessel or unexpected calcification. Skull: Normal. Negative for fracture or focal lesion. Hyperostosis frontalis interna. Sinuses/Orbits: No acute finding. Other: None CT MAXILLOFACIAL FINDINGS Osseous: The right maxillary central incisor is dislodged and there are small  adjacent fracture fragments from the injured alveolar ridge. No additional facial fracture identified. The mandible is intact. Orbits: Negative. No traumatic or inflammatory finding. Sinuses: Clear. Soft tissues: Focal edema of the right aspect of the lower lip. There are 3 high density fragments within the lower lip soft tissues measuring 6, 2 and 3 mm respectively. These fragments likely represent tooth fragments. Large carie in the right mandibular second molar. Additionally, the right maxillary second premolar is broken. Limited intracranial: No significant or unexpected finding. IMPRESSION: CT HEAD 1. Negative CT FACE 1. Anterior and superior displacement of the right maxillary central incisor with fracture of the anterior alveolar ridge. No additional facial fractures identified. 2. Soft tissue injury to the right lower lip which contains 3 high-density fragments favored to represent tooth fragments. The fragments range in size from 2-6 mm. 3. Broken versus carious right  maxillary second premolar. 4. Carious right mandibular second molar. Electronically Signed   By: Jacqulynn Cadet M.D.   On: 05/17/2016 20:34    Microbiology: No results found for this or any previous visit (from the past 240 hour(s)).   Labs: Basic Metabolic Panel:  Recent Labs Lab 05/17/16 2031 05/18/16 0855 05/19/16 0658 05/20/16 1015  NA 137 134* 133* 130*  K 4.2 4.0 4.6 4.4  CL 105 101 100* 100*  CO2 23 24 25 23   GLUCOSE 152* 115* 183* 253*  BUN 20 21* 24* 29*  CREATININE 1.32* 1.80* 1.98* 1.52*  CALCIUM 9.0 8.2* 8.1* 8.2*   Liver Function Tests:  Recent Labs Lab 05/17/16 2031 05/18/16 0855 05/19/16 0658  AST 23 21 18   ALT 21 17 19   ALKPHOS 110 89 96  BILITOT 0.5 0.2* 0.5  PROT 8.2* 7.0 7.0  ALBUMIN 3.3* 2.7* 2.7*   No results for input(s): LIPASE, AMYLASE in the last 168 hours. No results for input(s): AMMONIA in the last 168 hours. CBC:  Recent Labs Lab 05/17/16 2031 05/18/16 0314 05/18/16 0855 05/19/16 0500  WBC 10.6* 11.5* 10.6* 8.2  NEUTROABS 8.0*  --  6.9 5.4  HGB 14.6 12.7 12.2 10.2*  HCT 44.5 40.1 39.5 31.6*  MCV 90.1 90.3 91.6 93.2  PLT 272 248 229 216   Cardiac Enzymes:  Recent Labs Lab 05/18/16 0314 05/18/16 0855  TROPONINI <0.03 <0.03   BNP: BNP (last 3 results) No results for input(s): BNP in the last 8760 hours.  ProBNP (last 3 results) No results for input(s): PROBNP in the last 8760 hours.  CBG:  Recent Labs Lab 05/18/16 2036 05/19/16 0757 05/19/16 1144 05/19/16 2140 05/20/16 0732  GLUCAP 87 207* 193* 245* 236*    Time spent: 35 minutes were spent in preparing this discharge including medication reconciliation, counseling, and coordination of care.  Signed:  Mako Pelfrey [employer]  [facility] 05/20/2016, 11:13 AM

## 2016-05-21 DIAGNOSIS — D72829 Elevated white blood cell count, unspecified: Secondary | ICD-10-CM | POA: Diagnosis not present

## 2016-05-21 DIAGNOSIS — R51 Headache: Secondary | ICD-10-CM | POA: Diagnosis not present

## 2016-05-21 DIAGNOSIS — R220 Localized swelling, mass and lump, head: Secondary | ICD-10-CM | POA: Diagnosis not present

## 2016-05-21 DIAGNOSIS — S025XXA Fracture of tooth (traumatic), initial encounter for closed fracture: Secondary | ICD-10-CM | POA: Diagnosis not present

## 2016-05-21 DIAGNOSIS — R443 Hallucinations, unspecified: Secondary | ICD-10-CM | POA: Diagnosis not present

## 2016-05-21 DIAGNOSIS — I2699 Other pulmonary embolism without acute cor pulmonale: Secondary | ICD-10-CM | POA: Diagnosis not present

## 2016-05-21 DIAGNOSIS — R918 Other nonspecific abnormal finding of lung field: Secondary | ICD-10-CM | POA: Diagnosis not present

## 2016-05-21 DIAGNOSIS — I129 Hypertensive chronic kidney disease with stage 1 through stage 4 chronic kidney disease, or unspecified chronic kidney disease: Secondary | ICD-10-CM | POA: Diagnosis not present

## 2016-05-21 DIAGNOSIS — S0240CA Maxillary fracture, right side, initial encounter for closed fracture: Secondary | ICD-10-CM | POA: Diagnosis not present

## 2016-05-21 DIAGNOSIS — E1122 Type 2 diabetes mellitus with diabetic chronic kidney disease: Secondary | ICD-10-CM | POA: Diagnosis not present

## 2016-05-21 DIAGNOSIS — R55 Syncope and collapse: Secondary | ICD-10-CM | POA: Diagnosis not present

## 2016-05-21 DIAGNOSIS — S0242XA Fracture of alveolus of maxilla, initial encounter for closed fracture: Secondary | ICD-10-CM | POA: Diagnosis not present

## 2016-05-21 DIAGNOSIS — R0602 Shortness of breath: Secondary | ICD-10-CM | POA: Diagnosis not present

## 2016-05-21 DIAGNOSIS — K047 Periapical abscess without sinus: Secondary | ICD-10-CM | POA: Diagnosis not present

## 2016-05-22 DIAGNOSIS — K047 Periapical abscess without sinus: Secondary | ICD-10-CM | POA: Diagnosis not present

## 2016-05-22 DIAGNOSIS — R55 Syncope and collapse: Secondary | ICD-10-CM | POA: Diagnosis present

## 2016-05-22 DIAGNOSIS — K122 Cellulitis and abscess of mouth: Secondary | ICD-10-CM | POA: Diagnosis not present

## 2016-05-22 DIAGNOSIS — I2699 Other pulmonary embolism without acute cor pulmonale: Secondary | ICD-10-CM | POA: Diagnosis not present

## 2016-05-22 DIAGNOSIS — E1122 Type 2 diabetes mellitus with diabetic chronic kidney disease: Secondary | ICD-10-CM | POA: Diagnosis not present

## 2016-05-22 DIAGNOSIS — S0993XA Unspecified injury of face, initial encounter: Secondary | ICD-10-CM | POA: Diagnosis not present

## 2016-05-22 DIAGNOSIS — G629 Polyneuropathy, unspecified: Secondary | ICD-10-CM | POA: Diagnosis present

## 2016-05-22 DIAGNOSIS — Z7901 Long term (current) use of anticoagulants: Secondary | ICD-10-CM | POA: Diagnosis not present

## 2016-05-22 DIAGNOSIS — Z888 Allergy status to other drugs, medicaments and biological substances status: Secondary | ICD-10-CM | POA: Diagnosis not present

## 2016-05-22 DIAGNOSIS — E860 Dehydration: Secondary | ICD-10-CM | POA: Diagnosis present

## 2016-05-22 DIAGNOSIS — K029 Dental caries, unspecified: Secondary | ICD-10-CM | POA: Diagnosis not present

## 2016-05-22 DIAGNOSIS — H409 Unspecified glaucoma: Secondary | ICD-10-CM | POA: Diagnosis not present

## 2016-05-22 DIAGNOSIS — M272 Inflammatory conditions of jaws: Secondary | ICD-10-CM | POA: Diagnosis not present

## 2016-05-22 DIAGNOSIS — R600 Localized edema: Secondary | ICD-10-CM | POA: Diagnosis not present

## 2016-05-22 DIAGNOSIS — E11319 Type 2 diabetes mellitus with unspecified diabetic retinopathy without macular edema: Secondary | ICD-10-CM | POA: Diagnosis not present

## 2016-05-22 DIAGNOSIS — Z8701 Personal history of pneumonia (recurrent): Secondary | ICD-10-CM | POA: Diagnosis not present

## 2016-05-22 DIAGNOSIS — I129 Hypertensive chronic kidney disease with stage 1 through stage 4 chronic kidney disease, or unspecified chronic kidney disease: Secondary | ICD-10-CM | POA: Diagnosis not present

## 2016-05-22 DIAGNOSIS — E119 Type 2 diabetes mellitus without complications: Secondary | ICD-10-CM | POA: Diagnosis not present

## 2016-05-22 DIAGNOSIS — I2782 Chronic pulmonary embolism: Secondary | ICD-10-CM | POA: Diagnosis not present

## 2016-05-22 DIAGNOSIS — Z882 Allergy status to sulfonamides status: Secondary | ICD-10-CM | POA: Diagnosis not present

## 2016-05-22 DIAGNOSIS — M1990 Unspecified osteoarthritis, unspecified site: Secondary | ICD-10-CM | POA: Diagnosis present

## 2016-05-22 DIAGNOSIS — I4581 Long QT syndrome: Secondary | ICD-10-CM | POA: Diagnosis not present

## 2016-05-22 DIAGNOSIS — Z87891 Personal history of nicotine dependence: Secondary | ICD-10-CM | POA: Diagnosis not present

## 2016-05-22 DIAGNOSIS — E1139 Type 2 diabetes mellitus with other diabetic ophthalmic complication: Secondary | ICD-10-CM | POA: Diagnosis not present

## 2016-05-22 DIAGNOSIS — Z7902 Long term (current) use of antithrombotics/antiplatelets: Secondary | ICD-10-CM | POA: Diagnosis not present

## 2016-05-22 DIAGNOSIS — S0280XA Fracture of other specified skull and facial bones, unspecified side, initial encounter for closed fracture: Secondary | ICD-10-CM | POA: Diagnosis not present

## 2016-05-22 DIAGNOSIS — R930 Abnormal findings on diagnostic imaging of skull and head, not elsewhere classified: Secondary | ICD-10-CM | POA: Diagnosis not present

## 2016-05-22 DIAGNOSIS — R946 Abnormal results of thyroid function studies: Secondary | ICD-10-CM | POA: Diagnosis not present

## 2016-05-22 DIAGNOSIS — D72829 Elevated white blood cell count, unspecified: Secondary | ICD-10-CM | POA: Diagnosis not present

## 2016-05-22 DIAGNOSIS — N189 Chronic kidney disease, unspecified: Secondary | ICD-10-CM | POA: Diagnosis not present

## 2016-05-22 DIAGNOSIS — S098XXA Other specified injuries of head, initial encounter: Secondary | ICD-10-CM | POA: Diagnosis not present

## 2016-05-22 DIAGNOSIS — H269 Unspecified cataract: Secondary | ICD-10-CM | POA: Diagnosis present

## 2016-05-22 DIAGNOSIS — E1142 Type 2 diabetes mellitus with diabetic polyneuropathy: Secondary | ICD-10-CM | POA: Diagnosis not present

## 2016-05-22 DIAGNOSIS — Z794 Long term (current) use of insulin: Secondary | ICD-10-CM | POA: Diagnosis not present

## 2016-05-22 DIAGNOSIS — I10 Essential (primary) hypertension: Secondary | ICD-10-CM | POA: Diagnosis not present

## 2016-05-22 DIAGNOSIS — S0181XA Laceration without foreign body of other part of head, initial encounter: Secondary | ICD-10-CM | POA: Diagnosis not present

## 2016-05-22 DIAGNOSIS — S0242XA Fracture of alveolus of maxilla, initial encounter for closed fracture: Secondary | ICD-10-CM | POA: Diagnosis not present

## 2016-05-22 DIAGNOSIS — R51 Headache: Secondary | ICD-10-CM | POA: Diagnosis not present

## 2016-05-22 DIAGNOSIS — R778 Other specified abnormalities of plasma proteins: Secondary | ICD-10-CM | POA: Diagnosis not present

## 2016-05-22 DIAGNOSIS — S025XXA Fracture of tooth (traumatic), initial encounter for closed fracture: Secondary | ICD-10-CM | POA: Diagnosis not present

## 2016-05-22 DIAGNOSIS — R220 Localized swelling, mass and lump, head: Secondary | ICD-10-CM | POA: Diagnosis not present

## 2016-05-22 DIAGNOSIS — E1136 Type 2 diabetes mellitus with diabetic cataract: Secondary | ICD-10-CM | POA: Diagnosis not present

## 2016-05-22 DIAGNOSIS — K219 Gastro-esophageal reflux disease without esophagitis: Secondary | ICD-10-CM | POA: Diagnosis not present

## 2016-05-22 DIAGNOSIS — E785 Hyperlipidemia, unspecified: Secondary | ICD-10-CM | POA: Diagnosis not present

## 2016-05-22 DIAGNOSIS — S0281XA Fracture of other specified skull and facial bones, right side, initial encounter for closed fracture: Secondary | ICD-10-CM | POA: Diagnosis not present

## 2016-05-23 LAB — FACTOR 5 LEIDEN

## 2016-05-24 LAB — PROTHROMBIN GENE MUTATION

## 2016-06-01 DIAGNOSIS — Z79899 Other long term (current) drug therapy: Secondary | ICD-10-CM | POA: Diagnosis not present

## 2016-06-01 DIAGNOSIS — Z882 Allergy status to sulfonamides status: Secondary | ICD-10-CM | POA: Diagnosis not present

## 2016-06-01 DIAGNOSIS — S025XXA Fracture of tooth (traumatic), initial encounter for closed fracture: Secondary | ICD-10-CM | POA: Diagnosis not present

## 2016-06-01 DIAGNOSIS — I10 Essential (primary) hypertension: Secondary | ICD-10-CM | POA: Diagnosis not present

## 2016-06-01 DIAGNOSIS — Z794 Long term (current) use of insulin: Secondary | ICD-10-CM | POA: Diagnosis not present

## 2016-06-01 DIAGNOSIS — E11319 Type 2 diabetes mellitus with unspecified diabetic retinopathy without macular edema: Secondary | ICD-10-CM | POA: Diagnosis not present

## 2016-06-01 DIAGNOSIS — G8911 Acute pain due to trauma: Secondary | ICD-10-CM | POA: Diagnosis not present

## 2016-06-01 DIAGNOSIS — S025XXS Fracture of tooth (traumatic), sequela: Secondary | ICD-10-CM | POA: Diagnosis not present

## 2016-06-01 DIAGNOSIS — Z888 Allergy status to other drugs, medicaments and biological substances status: Secondary | ICD-10-CM | POA: Diagnosis not present

## 2016-06-01 DIAGNOSIS — B379 Candidiasis, unspecified: Secondary | ICD-10-CM | POA: Diagnosis not present

## 2016-06-03 ENCOUNTER — Encounter (HOSPITAL_COMMUNITY): Payer: Self-pay | Admitting: Family Medicine

## 2016-06-03 ENCOUNTER — Ambulatory Visit (HOSPITAL_COMMUNITY)
Admission: EM | Admit: 2016-06-03 | Discharge: 2016-06-03 | Disposition: A | Discharge status: Home / Self Care | Payer: Medicare Other | Attending: Internal Medicine | Admitting: Internal Medicine

## 2016-06-03 DIAGNOSIS — B372 Candidiasis of skin and nail: Secondary | ICD-10-CM | POA: Diagnosis not present

## 2016-06-03 MED ORDER — FLUCONAZOLE 200 MG PO TABS: ORAL_TABLET | ORAL | 0 refills | Status: DC

## 2016-06-03 MED ORDER — ALIGN 4 MG PO CAPS: 1.0000 | ORAL_CAPSULE | Freq: Every day | ORAL | 2 refills | Status: DC

## 2016-06-03 MED ORDER — CLOTRIMAZOLE 1 % EX CREA: TOPICAL_CREAM | CUTANEOUS | 0 refills | Status: DC

## 2016-06-03 NOTE — ED Provider Notes (Signed)
CSN: [id number]     Arrival date & time 06/03/16  1512 History   First MD Initiated Contact with Patient 06/03/16 1713     Chief Complaint  Patient presents with  . Rash   (Consider location/radiation/quality/duration/timing/severity/associated sxs/prior Treatment)  HPI  Patient is a 55 year old female presents tonight with complaints of an uncomfortable occasionally itchy rash under her armpits and in her groin following discharge from the hospital last week. Patient is concerned that it is an allergic reaction to the amoxicillin prescription that she finished 2 days ago. Patient has significant medical history including recent hospitalization for bilateral pulmonary embolus. She has edema of the lips on the right side of her face which are the result of a recent fall and not related to her complaint of allergic reaction.  Past Medical History:  Diagnosis Date  . Acid reflux   . Diabetes mellitus without complication ([place])   . High cholesterol   . Hx of chest tube placement right   . Hypertension   . Neuropathy ([place])   . Pneumonia    Past Surgical History:  Procedure Laterality Date  . TUBAL LIGATION     Family History  Problem Relation Age of Onset  . Pancreatitis Mother   . Diabetes type II Mother   . CAD Father   . Diabetes type II Father   . Diabetes type II Sister   . Diabetes type II Brother    Social History  Substance Use Topics  . Smoking status: Former Smoker    Quit date: 09/11/2005  . Smokeless tobacco: Never Used  . Alcohol use No   OB History    Gravida Para Term Preterm AB Living   5 4 4   1 4    SAB TAB Ectopic Multiple Live Births     1           Review of Systems  Constitutional: Negative.  Negative for fatigue and fever.  HENT: Negative.   Eyes: Negative.  Negative for visual disturbance.  Respiratory: Positive for shortness of breath. Negative for cough.        The patient reports shortness of breath. However, patient states shortness breath is  equivalent and approximately the same as it was at the time of her discharge from the hospital and has not changed with antibiotic.  Cardiovascular: Negative.  Negative for chest pain and leg swelling.  Gastrointestinal: Negative.   Endocrine: Negative.   Genitourinary: Negative.   Musculoskeletal: Negative.  Negative for gait problem and neck stiffness.  Skin: Positive for rash.  Allergic/Immunologic: Negative.   Neurological: Negative.  Negative for dizziness and headaches.  Hematological: Negative.   Psychiatric/Behavioral: Negative.     Allergies  Lisinopril; Metformin and related; Reglan [metoclopramide]; Sulfa antibiotics; and Wellbutrin [bupropion]  Home Medications   Prior to Admission medications   Medication Sig Start Date End Date Taking? Authorizing Provider  amLODipine (NORVASC) 5 MG tablet Take 5 mg by mouth daily. Reported on 09/16/2015    Historical Provider, MD  blood glucose meter kit and supplies Dispense based on patient and insurance preference. Use four times with each meal and at bedtime. (FOR ICD-9 250.00, 250.01). 05/19/16   Mir Marry Guan, MD  cholecalciferol (VITAMIN D) 1000 units tablet Take 1,000 Units by mouth 2 (two) times daily.    Historical Provider, MD  clotrimazole (LOTRIMIN) 1 % cream Apply to affected area 2 times daily 06/03/16   Nehemiah Settle, NP  ezetimibe-simvastatin (VYTORIN) 10-10 MG tablet Take 1  tablet by mouth daily.    Historical Provider, MD  fluconazole (DIFLUCAN) 200 MG tablet Take one 276m tablet on day one and take second 2051mtablet on day 4. 06/03/16   CaNehemiah SettleNP  gabapentin (NEURONTIN) 400 MG capsule Take 400 mg by mouth 3 (three) times daily. 03/16/16   Historical Provider, MD  hydrochlorothiazide (MICROZIDE) 12.5 MG capsule Take 12.5 mg by mouth daily.    Historical Provider, MD  insulin aspart (NOVOLOG) 100 UNIT/ML injection Inject 5 Units into the skin 2 (two) times daily.    Historical Provider, MD  insulin  glargine (LANTUS) 100 UNIT/ML injection Inject 0.15 mLs (15 Units total) into the skin 2 (two) times daily. 05/19/16   Mir MoMarry GuanMD  loperamide (IMODIUM) 2 MG capsule Take 8 mg by mouth daily.    Historical Provider, MD  MELATONIN PO Take 1 tablet by mouth daily.    Historical Provider, MD  methocarbamol (ROBAXIN) 750 MG tablet Take 1,500 mg by mouth 2 (two) times daily as needed for muscle spasms.    Historical Provider, MD  metoprolol tartrate (LOPRESSOR) 25 MG tablet Take 25 mg by mouth 2 (two) times daily.    Historical Provider, MD  omeprazole (PRILOSEC) 40 MG capsule Take 40 mg by mouth daily. 03/16/16   Historical Provider, MD  oxyCODONE 10 MG TABS Take 1 tablet (10 mg total) by mouth every 6 (six) hours as needed for moderate pain. 05/19/16   Mir MoMarry GuanMD  PARoxetine (PAXIL) 40 MG tablet Take 40 mg by mouth daily with breakfast.    Historical Provider, MD  Probiotic Product (ALIGN) 4 MG CAPS Take 1 capsule (4 mg total) by mouth daily. 06/03/16   CaNehemiah SettleNP  Rivaroxaban 15 & 20 MG TBPK Take as directed on package: Start with one 1514mablet by mouth twice a day with food. On Day 22, switch to one 17m46mblet once a day with food. 05/19/16   Mir MohaMarry Guan   Meds Ordered and Administered this Visit  Medications - No data to display  BP 156/68 (BP Location: Left Arm)   Pulse 83   Temp 97.8 F (36.6 C) (Oral)   Resp 15   LMP 04/26/2016 Comment: U preg neg 05/17/16  SpO2 100%  No data found.   Physical Exam  Constitutional: She appears well-developed and well-nourished. No distress.  Eyes: Conjunctivae are normal. Pupils are equal, round, and reactive to light. Right eye exhibits no discharge. Left eye exhibits no discharge. No scleral icterus.  Neck: Normal range of motion. Neck supple. No thyromegaly present.  Cardiovascular: Normal rate, regular rhythm, normal heart sounds and intact distal pulses.  Exam reveals no gallop and no friction  rub.   No murmur heard. Pulmonary/Chest: Effort normal and breath sounds normal. No respiratory distress. She has no wheezes. She has no rales. She exhibits no tenderness.  No respiratory distress or adventitious breath sounds noted. Air exchange heard in all fields and lobes.  Skin: Skin is warm and dry. Rash noted. She is not diaphoretic.  Patient has excoriated areas under bilateral arms and in the folds of her groin and thighs consistent with yeast. Most likely resulting from recent antibiotic administration. Patient is a diabetic and susceptible.  Nursing note and vitals reviewed.   Urgent Care Course   Clinical Course    Procedures (including critical care time)  Labs Review Labs Reviewed - No data to display  Imaging Review No results found.  Discussed the importance of eating yogurt daily and prescribed a probiotic to facilitate resolution. Patient prescribed 2 doses of Diflucan as pulse therapy on day one and day 4 and discussed importance of reporting muscle aching with concurrent statin use.  MDM   1. Yeast dermatitis    Meds ordered this encounter  Medications  . Probiotic Product (ALIGN) 4 MG CAPS    Sig: Take 1 capsule (4 mg total) by mouth daily.    Dispense:  30 capsule    Refill:  2  . fluconazole (DIFLUCAN) 200 MG tablet    Sig: Take one 23m tablet on day one and take second 2051mtablet on day 4.    Dispense:  2 tablet    Refill:  0  . clotrimazole (LOTRIMIN) 1 % cream    Sig: Apply to affected area 2 times daily    Dispense:  15 g    Refill:  0   The usual and customary discharge instructions and warnings were given.  The patient verbalizes understanding and agrees to plan of care.       CaNehemiah SettleNP 06/03/16 1824

## 2016-06-03 NOTE — Discharge Instructions (Signed)
Follow up with your Primary Care provider if continues or fails to improve.  If you experience any intense muscle aches discontinue medication and contact your doctor or go to Emergency Department.

## 2016-06-03 NOTE — ED Triage Notes (Signed)
Pt here for possible allergic reaction to abx. sts she recently finished prescription for amoxicillan and was itching and rash the whole time. sts also that she has been SOB. sts she is on blood thinners for recent dx of PE.

## 2016-06-09 DIAGNOSIS — I10 Essential (primary) hypertension: Secondary | ICD-10-CM | POA: Diagnosis not present

## 2016-06-09 DIAGNOSIS — L97521 Non-pressure chronic ulcer of other part of left foot limited to breakdown of skin: Secondary | ICD-10-CM | POA: Diagnosis not present

## 2016-06-09 DIAGNOSIS — G8929 Other chronic pain: Secondary | ICD-10-CM | POA: Diagnosis not present

## 2016-06-09 DIAGNOSIS — E1142 Type 2 diabetes mellitus with diabetic polyneuropathy: Secondary | ICD-10-CM | POA: Diagnosis not present

## 2016-06-09 DIAGNOSIS — Z794 Long term (current) use of insulin: Secondary | ICD-10-CM | POA: Diagnosis not present

## 2016-06-09 DIAGNOSIS — Z888 Allergy status to other drugs, medicaments and biological substances status: Secondary | ICD-10-CM | POA: Diagnosis not present

## 2016-06-09 DIAGNOSIS — Z9181 History of falling: Secondary | ICD-10-CM | POA: Diagnosis not present

## 2016-06-09 DIAGNOSIS — Z882 Allergy status to sulfonamides status: Secondary | ICD-10-CM | POA: Diagnosis not present

## 2016-06-09 DIAGNOSIS — M549 Dorsalgia, unspecified: Secondary | ICD-10-CM | POA: Diagnosis not present

## 2016-06-09 DIAGNOSIS — Z9851 Tubal ligation status: Secondary | ICD-10-CM | POA: Diagnosis not present

## 2016-06-09 DIAGNOSIS — K219 Gastro-esophageal reflux disease without esophagitis: Secondary | ICD-10-CM | POA: Insufficient documentation

## 2016-06-09 DIAGNOSIS — E785 Hyperlipidemia, unspecified: Secondary | ICD-10-CM | POA: Diagnosis not present

## 2016-06-09 DIAGNOSIS — Z79899 Other long term (current) drug therapy: Secondary | ICD-10-CM | POA: Diagnosis not present

## 2016-06-09 DIAGNOSIS — Z832 Family history of diseases of the blood and blood-forming organs and certain disorders involving the immune mechanism: Secondary | ICD-10-CM | POA: Diagnosis not present

## 2016-06-09 DIAGNOSIS — Z79891 Long term (current) use of opiate analgesic: Secondary | ICD-10-CM | POA: Diagnosis not present

## 2016-06-09 DIAGNOSIS — E113293 Type 2 diabetes mellitus with mild nonproliferative diabetic retinopathy without macular edema, bilateral: Secondary | ICD-10-CM | POA: Diagnosis not present

## 2016-06-09 DIAGNOSIS — Z87891 Personal history of nicotine dependence: Secondary | ICD-10-CM | POA: Diagnosis not present

## 2016-06-09 DIAGNOSIS — Z9889 Other specified postprocedural states: Secondary | ICD-10-CM | POA: Diagnosis not present

## 2016-06-09 DIAGNOSIS — Z86711 Personal history of pulmonary embolism: Secondary | ICD-10-CM | POA: Diagnosis not present

## 2016-06-09 DIAGNOSIS — E669 Obesity, unspecified: Secondary | ICD-10-CM | POA: Insufficient documentation

## 2016-06-09 DIAGNOSIS — E11621 Type 2 diabetes mellitus with foot ulcer: Secondary | ICD-10-CM | POA: Diagnosis not present

## 2016-06-09 DIAGNOSIS — Z7901 Long term (current) use of anticoagulants: Secondary | ICD-10-CM | POA: Diagnosis not present

## 2016-06-09 DIAGNOSIS — Z23 Encounter for immunization: Secondary | ICD-10-CM | POA: Diagnosis not present

## 2016-06-09 DIAGNOSIS — L97529 Non-pressure chronic ulcer of other part of left foot with unspecified severity: Secondary | ICD-10-CM | POA: Diagnosis not present

## 2016-06-09 DIAGNOSIS — E11319 Type 2 diabetes mellitus with unspecified diabetic retinopathy without macular edema: Secondary | ICD-10-CM | POA: Diagnosis not present

## 2016-06-09 DIAGNOSIS — I2699 Other pulmonary embolism without acute cor pulmonale: Secondary | ICD-10-CM | POA: Diagnosis not present

## 2016-06-09 DIAGNOSIS — R0602 Shortness of breath: Secondary | ICD-10-CM | POA: Diagnosis not present

## 2016-06-09 DIAGNOSIS — K1379 Other lesions of oral mucosa: Secondary | ICD-10-CM | POA: Diagnosis not present

## 2016-06-09 DIAGNOSIS — Z0000 Encounter for general adult medical examination without abnormal findings: Secondary | ICD-10-CM | POA: Diagnosis not present

## 2016-07-07 DIAGNOSIS — Z9181 History of falling: Secondary | ICD-10-CM | POA: Diagnosis not present

## 2016-07-07 DIAGNOSIS — S025XXD Fracture of tooth (traumatic), subsequent encounter for fracture with routine healing: Secondary | ICD-10-CM | POA: Diagnosis not present

## 2016-07-07 DIAGNOSIS — E119 Type 2 diabetes mellitus without complications: Secondary | ICD-10-CM | POA: Diagnosis not present

## 2016-07-07 DIAGNOSIS — H2513 Age-related nuclear cataract, bilateral: Secondary | ICD-10-CM | POA: Diagnosis not present

## 2016-07-07 DIAGNOSIS — S01502D Unspecified open wound of oral cavity, subsequent encounter: Secondary | ICD-10-CM | POA: Diagnosis not present

## 2016-07-12 DIAGNOSIS — D649 Anemia, unspecified: Secondary | ICD-10-CM

## 2016-07-12 HISTORY — DX: Anemia, unspecified: D64.9

## 2016-07-18 DIAGNOSIS — S025XXD Fracture of tooth (traumatic), subsequent encounter for fracture with routine healing: Secondary | ICD-10-CM | POA: Diagnosis not present

## 2016-07-18 DIAGNOSIS — E119 Type 2 diabetes mellitus without complications: Secondary | ICD-10-CM | POA: Diagnosis not present

## 2016-07-18 DIAGNOSIS — Z9181 History of falling: Secondary | ICD-10-CM | POA: Diagnosis not present

## 2016-07-18 DIAGNOSIS — S01502D Unspecified open wound of oral cavity, subsequent encounter: Secondary | ICD-10-CM | POA: Diagnosis not present

## 2016-07-18 DIAGNOSIS — H2513 Age-related nuclear cataract, bilateral: Secondary | ICD-10-CM | POA: Diagnosis not present

## 2016-07-19 DIAGNOSIS — E119 Type 2 diabetes mellitus without complications: Secondary | ICD-10-CM | POA: Diagnosis not present

## 2016-07-19 DIAGNOSIS — Z9181 History of falling: Secondary | ICD-10-CM | POA: Diagnosis not present

## 2016-07-19 DIAGNOSIS — S025XXD Fracture of tooth (traumatic), subsequent encounter for fracture with routine healing: Secondary | ICD-10-CM | POA: Diagnosis not present

## 2016-07-19 DIAGNOSIS — S01502D Unspecified open wound of oral cavity, subsequent encounter: Secondary | ICD-10-CM | POA: Diagnosis not present

## 2016-07-19 DIAGNOSIS — H2513 Age-related nuclear cataract, bilateral: Secondary | ICD-10-CM | POA: Diagnosis not present

## 2016-07-21 DIAGNOSIS — E119 Type 2 diabetes mellitus without complications: Secondary | ICD-10-CM | POA: Diagnosis not present

## 2016-07-21 DIAGNOSIS — S025XXD Fracture of tooth (traumatic), subsequent encounter for fracture with routine healing: Secondary | ICD-10-CM | POA: Diagnosis not present

## 2016-07-21 DIAGNOSIS — H2513 Age-related nuclear cataract, bilateral: Secondary | ICD-10-CM | POA: Diagnosis not present

## 2016-07-21 DIAGNOSIS — Z9181 History of falling: Secondary | ICD-10-CM | POA: Diagnosis not present

## 2016-07-21 DIAGNOSIS — S01502D Unspecified open wound of oral cavity, subsequent encounter: Secondary | ICD-10-CM | POA: Diagnosis not present

## 2016-07-23 ENCOUNTER — Encounter (HOSPITAL_COMMUNITY): Payer: Self-pay

## 2016-07-23 ENCOUNTER — Inpatient Hospital Stay (HOSPITAL_COMMUNITY)
Admission: EM | Admit: 2016-07-23 | Discharge: 2016-08-07 | DRG: 233 | Disposition: A | Discharge status: Home Health | Payer: Medicare Other | Attending: Cardiothoracic Surgery | Admitting: Cardiothoracic Surgery

## 2016-07-23 ENCOUNTER — Emergency Department (HOSPITAL_COMMUNITY): Payer: Medicare Other

## 2016-07-23 DIAGNOSIS — I2699 Other pulmonary embolism without acute cor pulmonale: Secondary | ICD-10-CM | POA: Diagnosis not present

## 2016-07-23 DIAGNOSIS — E739 Lactose intolerance, unspecified: Secondary | ICD-10-CM | POA: Diagnosis present

## 2016-07-23 DIAGNOSIS — Z882 Allergy status to sulfonamides status: Secondary | ICD-10-CM

## 2016-07-23 DIAGNOSIS — R0789 Other chest pain: Secondary | ICD-10-CM | POA: Diagnosis not present

## 2016-07-23 DIAGNOSIS — E785 Hyperlipidemia, unspecified: Secondary | ICD-10-CM | POA: Diagnosis not present

## 2016-07-23 DIAGNOSIS — E1165 Type 2 diabetes mellitus with hyperglycemia: Secondary | ICD-10-CM | POA: Diagnosis present

## 2016-07-23 DIAGNOSIS — N990 Postprocedural (acute) (chronic) kidney failure: Secondary | ICD-10-CM | POA: Diagnosis not present

## 2016-07-23 DIAGNOSIS — Z0000 Encounter for general adult medical examination without abnormal findings: Secondary | ICD-10-CM

## 2016-07-23 DIAGNOSIS — I214 Non-ST elevation (NSTEMI) myocardial infarction: Secondary | ICD-10-CM | POA: Diagnosis not present

## 2016-07-23 DIAGNOSIS — I5022 Chronic systolic (congestive) heart failure: Secondary | ICD-10-CM | POA: Diagnosis present

## 2016-07-23 DIAGNOSIS — I130 Hypertensive heart and chronic kidney disease with heart failure and stage 1 through stage 4 chronic kidney disease, or unspecified chronic kidney disease: Secondary | ICD-10-CM | POA: Diagnosis present

## 2016-07-23 DIAGNOSIS — N183 Chronic kidney disease, stage 3 (moderate): Secondary | ICD-10-CM | POA: Diagnosis present

## 2016-07-23 DIAGNOSIS — R079 Chest pain, unspecified: Secondary | ICD-10-CM | POA: Diagnosis present

## 2016-07-23 DIAGNOSIS — E871 Hypo-osmolality and hyponatremia: Secondary | ICD-10-CM | POA: Diagnosis not present

## 2016-07-23 DIAGNOSIS — D62 Acute posthemorrhagic anemia: Secondary | ICD-10-CM | POA: Diagnosis not present

## 2016-07-23 DIAGNOSIS — I200 Unstable angina: Secondary | ICD-10-CM

## 2016-07-23 DIAGNOSIS — N170 Acute kidney failure with tubular necrosis: Secondary | ICD-10-CM | POA: Diagnosis present

## 2016-07-23 DIAGNOSIS — R0602 Shortness of breath: Secondary | ICD-10-CM

## 2016-07-23 DIAGNOSIS — J9811 Atelectasis: Secondary | ICD-10-CM

## 2016-07-23 DIAGNOSIS — E1121 Type 2 diabetes mellitus with diabetic nephropathy: Secondary | ICD-10-CM | POA: Diagnosis not present

## 2016-07-23 DIAGNOSIS — I10 Essential (primary) hypertension: Secondary | ICD-10-CM | POA: Diagnosis present

## 2016-07-23 DIAGNOSIS — Z87891 Personal history of nicotine dependence: Secondary | ICD-10-CM

## 2016-07-23 DIAGNOSIS — Z888 Allergy status to other drugs, medicaments and biological substances status: Secondary | ICD-10-CM

## 2016-07-23 DIAGNOSIS — I4581 Long QT syndrome: Secondary | ICD-10-CM | POA: Diagnosis present

## 2016-07-23 DIAGNOSIS — I2584 Coronary atherosclerosis due to calcified coronary lesion: Secondary | ICD-10-CM | POA: Diagnosis present

## 2016-07-23 DIAGNOSIS — F339 Major depressive disorder, recurrent, unspecified: Secondary | ICD-10-CM | POA: Diagnosis present

## 2016-07-23 DIAGNOSIS — R197 Diarrhea, unspecified: Secondary | ICD-10-CM | POA: Diagnosis present

## 2016-07-23 DIAGNOSIS — Z794 Long term (current) use of insulin: Secondary | ICD-10-CM

## 2016-07-23 DIAGNOSIS — E782 Mixed hyperlipidemia: Secondary | ICD-10-CM

## 2016-07-23 DIAGNOSIS — Z91040 Latex allergy status: Secondary | ICD-10-CM

## 2016-07-23 DIAGNOSIS — K219 Gastro-esophageal reflux disease without esophagitis: Secondary | ICD-10-CM | POA: Diagnosis present

## 2016-07-23 DIAGNOSIS — Z833 Family history of diabetes mellitus: Secondary | ICD-10-CM

## 2016-07-23 DIAGNOSIS — E041 Nontoxic single thyroid nodule: Secondary | ICD-10-CM | POA: Diagnosis present

## 2016-07-23 DIAGNOSIS — E1122 Type 2 diabetes mellitus with diabetic chronic kidney disease: Secondary | ICD-10-CM | POA: Diagnosis present

## 2016-07-23 DIAGNOSIS — I25110 Atherosclerotic heart disease of native coronary artery with unstable angina pectoris: Secondary | ICD-10-CM | POA: Diagnosis present

## 2016-07-23 DIAGNOSIS — R262 Difficulty in walking, not elsewhere classified: Secondary | ICD-10-CM

## 2016-07-23 DIAGNOSIS — Z880 Allergy status to penicillin: Secondary | ICD-10-CM

## 2016-07-23 DIAGNOSIS — E1142 Type 2 diabetes mellitus with diabetic polyneuropathy: Secondary | ICD-10-CM | POA: Diagnosis present

## 2016-07-23 DIAGNOSIS — Z818 Family history of other mental and behavioral disorders: Secondary | ICD-10-CM

## 2016-07-23 DIAGNOSIS — I249 Acute ischemic heart disease, unspecified: Secondary | ICD-10-CM | POA: Diagnosis present

## 2016-07-23 DIAGNOSIS — Z98818 Other dental procedure status: Secondary | ICD-10-CM

## 2016-07-23 DIAGNOSIS — Z951 Presence of aortocoronary bypass graft: Secondary | ICD-10-CM

## 2016-07-23 DIAGNOSIS — Z79899 Other long term (current) drug therapy: Secondary | ICD-10-CM

## 2016-07-23 DIAGNOSIS — I2510 Atherosclerotic heart disease of native coronary artery without angina pectoris: Secondary | ICD-10-CM

## 2016-07-23 DIAGNOSIS — Z6841 Body Mass Index (BMI) 40.0 and over, adult: Secondary | ICD-10-CM

## 2016-07-23 DIAGNOSIS — Z7901 Long term (current) use of anticoagulants: Secondary | ICD-10-CM

## 2016-07-23 DIAGNOSIS — Z91048 Other nonmedicinal substance allergy status: Secondary | ICD-10-CM

## 2016-07-23 DIAGNOSIS — Z79891 Long term (current) use of opiate analgesic: Secondary | ICD-10-CM

## 2016-07-23 DIAGNOSIS — A599 Trichomoniasis, unspecified: Secondary | ICD-10-CM | POA: Diagnosis present

## 2016-07-23 DIAGNOSIS — Z8249 Family history of ischemic heart disease and other diseases of the circulatory system: Secondary | ICD-10-CM

## 2016-07-23 DIAGNOSIS — E7800 Pure hypercholesterolemia, unspecified: Secondary | ICD-10-CM | POA: Diagnosis present

## 2016-07-23 DIAGNOSIS — I2782 Chronic pulmonary embolism: Secondary | ICD-10-CM

## 2016-07-23 DIAGNOSIS — D72828 Other elevated white blood cell count: Secondary | ICD-10-CM | POA: Diagnosis not present

## 2016-07-23 DIAGNOSIS — I2601 Septic pulmonary embolism with acute cor pulmonale: Secondary | ICD-10-CM

## 2016-07-23 DIAGNOSIS — E119 Type 2 diabetes mellitus without complications: Secondary | ICD-10-CM | POA: Diagnosis present

## 2016-07-23 LAB — CBC
HCT: 33.1 % — ABNORMAL LOW (ref 36.0–46.0)
HEMATOCRIT: 32.8 % — AB (ref 36.0–46.0)
Hemoglobin: 10.9 g/dL — ABNORMAL LOW (ref 12.0–15.0)
Hemoglobin: 10.4 g/dL — ABNORMAL LOW (ref 12.0–15.0)
MCH: 29.4 pg (ref 26.0–34.0)
MCH: 28.9 pg (ref 26.0–34.0)
MCHC: 32.9 g/dL (ref 30.0–36.0)
MCHC: 31.7 g/dL (ref 30.0–36.0)
MCV: 89.2 fL (ref 78.0–100.0)
MCV: 91.1 fL (ref 78.0–100.0)
PLATELETS: 240 10*3/uL (ref 150–400)
Platelets: 215 10*3/uL (ref 150–400)
RBC: 3.71 MIL/uL — ABNORMAL LOW (ref 3.87–5.11)
RBC: 3.6 MIL/uL — AB (ref 3.87–5.11)
RDW: 13.4 % (ref 11.5–15.5)
RDW: 13.3 % (ref 11.5–15.5)
WBC: 9.1 10*3/uL (ref 4.0–10.5)
WBC: 7.9 10*3/uL (ref 4.0–10.5)

## 2016-07-23 LAB — TSH: TSH: 0.382 u[IU]/mL (ref 0.350–4.500)

## 2016-07-23 LAB — HEPATIC FUNCTION PANEL
ALBUMIN: 2.6 g/dL — AB (ref 3.5–5.0)
ALT: 13 U/L — ABNORMAL LOW (ref 14–54)
AST: 18 U/L (ref 15–41)
Alkaline Phosphatase: 112 U/L (ref 38–126)
Bilirubin, Direct: <0.1 mg/dL — ABNORMAL LOW (ref 0.1–0.5)
TOTAL PROTEIN: 6.4 g/dL — AB (ref 6.5–8.1)
Total Bilirubin: 0.4 mg/dL (ref 0.3–1.2)

## 2016-07-23 LAB — PROTIME-INR
INR: 1.57
Prothrombin Time: 18.9 seconds — ABNORMAL HIGH (ref 11.4–15.2)

## 2016-07-23 LAB — LIPID PANEL
CHOL/HDL RATIO: 6.5 ratio
CHOLESTEROL: 265 mg/dL — AB (ref 0–200)
HDL: 41 mg/dL (ref >40)
LDL Cholesterol: 169 mg/dL — ABNORMAL HIGH (ref 0–99)
TRIGLYCERIDES: 277 mg/dL — AB (ref <150)
VLDL: 55 mg/dL — ABNORMAL HIGH (ref 0–40)

## 2016-07-23 LAB — BASIC METABOLIC PANEL
ANION GAP: 9 (ref 5–15)
BUN: 29 mg/dL — AB (ref 6–20)
CALCIUM: 8.3 mg/dL — AB (ref 8.9–10.3)
CO2: 22 mmol/L (ref 22–32)
CREATININE: 1.66 mg/dL — AB (ref 0.44–1.00)
Chloride: 106 mmol/L (ref 101–111)
GFR calc Af Amer: 40 mL/min — ABNORMAL LOW (ref >60)
GFR, EST NON AFRICAN AMERICAN: 34 mL/min — AB (ref >60)
GLUCOSE: 331 mg/dL — AB (ref 65–99)
Potassium: 4.1 mmol/L (ref 3.5–5.1)
Sodium: 137 mmol/L (ref 135–145)

## 2016-07-23 LAB — LIPASE, BLOOD: LIPASE: 28 U/L (ref 11–51)

## 2016-07-23 LAB — I-STAT TROPONIN, ED: TROPONIN I, POC: 0.02 ng/mL (ref 0.00–0.08)

## 2016-07-23 LAB — T4, FREE: Free T4: 1.14 ng/dL — ABNORMAL HIGH (ref 0.61–1.12)

## 2016-07-23 LAB — TROPONIN I
TROPONIN I: 0.04 ng/mL — AB (ref <0.03)
Troponin I: 0.04 ng/mL (ref <0.03)
Troponin I: 0.04 ng/mL (ref <0.03)

## 2016-07-23 LAB — APTT
APTT: 61 s — AB (ref 24–36)
aPTT: 51 seconds — ABNORMAL HIGH (ref 24–36)

## 2016-07-23 LAB — GLUCOSE, CAPILLARY
GLUCOSE-CAPILLARY: 311 mg/dL — AB (ref 65–99)
GLUCOSE-CAPILLARY: 241 mg/dL — AB (ref 65–99)
GLUCOSE-CAPILLARY: 209 mg/dL — AB (ref 65–99)
Glucose-Capillary: 182 mg/dL — ABNORMAL HIGH (ref 65–99)
Glucose-Capillary: 273 mg/dL — ABNORMAL HIGH (ref 65–99)

## 2016-07-23 LAB — MRSA PCR SCREENING: MRSA by PCR: NEGATIVE

## 2016-07-23 LAB — HEPARIN LEVEL (UNFRACTIONATED): HEPARIN UNFRACTIONATED: 1 [IU]/mL — AB (ref 0.30–0.70)

## 2016-07-23 MED ORDER — ONDANSETRON HCL 4 MG/2ML IJ SOLN
4.0000 mg | Freq: Four times a day (QID) | INTRAMUSCULAR | Status: DC | PRN
  Filled 2016-07-23: qty 2

## 2016-07-23 MED ORDER — INSULIN GLARGINE 100 UNIT/ML ~~LOC~~ SOLN: 15.0000 [IU] | Freq: Two times a day (BID) | SUBCUTANEOUS | Status: DC

## 2016-07-23 MED ORDER — METOPROLOL TARTRATE 25 MG PO TABS
25.0000 mg | ORAL_TABLET | Freq: Two times a day (BID) | ORAL | Status: DC
  Administered 2016-07-23 – 2016-07-24 (×4): 25 mg via ORAL
  Filled 2016-07-23 (×4): qty 1

## 2016-07-23 MED ORDER — NITROGLYCERIN 2 % TD OINT
1.0000 [in_us] | TOPICAL_OINTMENT | Freq: Once | TRANSDERMAL | Status: AC
  Administered 2016-07-23: 1 [in_us] via TOPICAL
  Filled 2016-07-23: qty 1

## 2016-07-23 MED ORDER — MORPHINE SULFATE (PF) 2 MG/ML IV SOLN
2.0000 mg | INTRAVENOUS | Status: DC | PRN
Start: 2016-07-23 — End: 2016-07-28
  Administered 2016-07-23 – 2016-07-24 (×5): 2 mg via INTRAVENOUS
  Filled 2016-07-23 (×5): qty 1

## 2016-07-23 MED ORDER — ACETAMINOPHEN 325 MG PO TABS: 650.0000 mg | ORAL_TABLET | ORAL | Status: DC | PRN

## 2016-07-23 MED ORDER — AMLODIPINE BESYLATE 10 MG PO TABS
10.0000 mg | ORAL_TABLET | Freq: Every day | ORAL | Status: DC
  Administered 2016-07-23 – 2016-07-27 (×5): 10 mg via ORAL
  Filled 2016-07-23 (×5): qty 1

## 2016-07-23 MED ORDER — ATORVASTATIN CALCIUM 80 MG PO TABS
80.0000 mg | ORAL_TABLET | Freq: Every day | ORAL | Status: DC
  Administered 2016-07-23 – 2016-07-27 (×5): 80 mg via ORAL
  Filled 2016-07-23 (×5): qty 1

## 2016-07-23 MED ORDER — ASPIRIN EC 81 MG PO TBEC
81.0000 mg | DELAYED_RELEASE_TABLET | Freq: Every day | ORAL | Status: DC
  Administered 2016-07-23 – 2016-07-27 (×4): 81 mg via ORAL
  Filled 2016-07-23 (×4): qty 1

## 2016-07-23 MED ORDER — HEPARIN BOLUS VIA INFUSION
4000.0000 [IU] | Freq: Once | INTRAVENOUS | Status: AC
  Administered 2016-07-23: 4000 [IU] via INTRAVENOUS
  Filled 2016-07-23: qty 4000

## 2016-07-23 MED ORDER — OXYCODONE HCL 5 MG PO TABS: 10.0000 mg | ORAL_TABLET | Freq: Four times a day (QID) | ORAL | Status: DC | PRN

## 2016-07-23 MED ORDER — MORPHINE SULFATE (PF) 4 MG/ML IV SOLN
INTRAVENOUS | Status: AC
  Filled 2016-07-23: qty 1

## 2016-07-23 MED ORDER — MORPHINE SULFATE (PF) 4 MG/ML IV SOLN
2.0000 mg | Freq: Once | INTRAVENOUS | Status: AC
  Administered 2016-07-23: 2 mg via INTRAVENOUS

## 2016-07-23 MED ORDER — INSULIN GLARGINE 100 UNIT/ML ~~LOC~~ SOLN
15.0000 [IU] | Freq: Every day | SUBCUTANEOUS | Status: DC
  Administered 2016-07-23 – 2016-07-27 (×5): 15 [IU] via SUBCUTANEOUS
  Filled 2016-07-23 (×6): qty 0.15

## 2016-07-23 MED ORDER — ACETAMINOPHEN 325 MG PO TABS: 650.0000 mg | ORAL_TABLET | Freq: Four times a day (QID) | ORAL | Status: DC | PRN

## 2016-07-23 MED ORDER — NITROGLYCERIN 0.4 MG SL SUBL: 0.4000 mg | SUBLINGUAL_TABLET | SUBLINGUAL | Status: DC | PRN

## 2016-07-23 MED ORDER — PAROXETINE HCL 20 MG PO TABS
40.0000 mg | ORAL_TABLET | Freq: Every day | ORAL | Status: DC
  Administered 2016-07-23 – 2016-07-27 (×5): 40 mg via ORAL
  Filled 2016-07-23 (×5): qty 2

## 2016-07-23 MED ORDER — NITROGLYCERIN 2 % TD OINT
1.0000 [in_us] | TOPICAL_OINTMENT | Freq: Four times a day (QID) | TRANSDERMAL | Status: DC
  Administered 2016-07-23 – 2016-07-28 (×18): 1 [in_us] via TOPICAL
  Filled 2016-07-23: qty 30

## 2016-07-23 MED ORDER — DIPHENHYDRAMINE HCL 25 MG PO CAPS
25.0000 mg | ORAL_CAPSULE | ORAL | Status: DC | PRN
  Administered 2016-07-23: 25 mg via ORAL
  Filled 2016-07-23: qty 1

## 2016-07-23 MED ORDER — INSULIN GLARGINE 100 UNIT/ML ~~LOC~~ SOLN: 25.0000 [IU] | Freq: Every day | SUBCUTANEOUS | Status: DC

## 2016-07-23 MED ORDER — SODIUM CHLORIDE 0.9 % IV SOLN
INTRAVENOUS | Status: DC
  Administered 2016-07-23 – 2016-07-25 (×3): via INTRAVENOUS

## 2016-07-23 MED ORDER — GABAPENTIN 400 MG PO CAPS
400.0000 mg | ORAL_CAPSULE | Freq: Three times a day (TID) | ORAL | Status: DC
  Administered 2016-07-23 – 2016-07-27 (×14): 400 mg via ORAL
  Filled 2016-07-23 (×14): qty 1

## 2016-07-23 MED ORDER — HEPARIN (PORCINE) IN NACL 100-0.45 UNIT/ML-% IJ SOLN
1550.0000 [IU]/h | INTRAMUSCULAR | Status: DC
  Administered 2016-07-23: 1000 [IU]/h via INTRAVENOUS
  Administered 2016-07-24: 1550 [IU]/h via INTRAVENOUS
  Filled 2016-07-23 (×3): qty 250

## 2016-07-23 MED ORDER — INSULIN ASPART 100 UNIT/ML ~~LOC~~ SOLN
0.0000 [IU] | SUBCUTANEOUS | Status: DC
  Administered 2016-07-23: 2 [IU] via SUBCUTANEOUS
  Administered 2016-07-23: 5 [IU] via SUBCUTANEOUS
  Administered 2016-07-23: 7 [IU] via SUBCUTANEOUS
  Administered 2016-07-23 – 2016-07-24 (×3): 3 [IU] via SUBCUTANEOUS
  Administered 2016-07-24: 7 [IU] via SUBCUTANEOUS
  Administered 2016-07-24: 5 [IU] via SUBCUTANEOUS
  Administered 2016-07-24 (×2): 3 [IU] via SUBCUTANEOUS
  Administered 2016-07-25: 1 [IU] via SUBCUTANEOUS
  Administered 2016-07-25: 3 [IU] via SUBCUTANEOUS
  Administered 2016-07-26: 2 [IU] via SUBCUTANEOUS
  Administered 2016-07-26: 1 [IU] via SUBCUTANEOUS

## 2016-07-23 NOTE — ED Provider Notes (Addendum)
[facility] DEPT Provider Note   CSN: [id number] Arrival date & time: 07/23/16  0241  By signing my name below, I, Irene Pap, attest that this documentation has been prepared under the direction and in the presence of Merryl Hacker, MD. Electronically Signed: Irene Pap, ED Scribe. 07/23/16. 2:59 AM.  History   Chief Complaint Chief Complaint  Patient presents with  . Chest Pain   The history is provided by the patient. No language interpreter was used.   HPI Comments: Tamara Henry is a 55 y.o. female with a hx of DM, HTN, and PE brought in by EMS who presents to the Emergency Department complaining of throbbing left sided chest pain that radiates down the left arm onset 1 hour ago. Pt reports associated tingling in the left hand. Pt says that she attempted to walk around to relieve the pain but did not notice a difference, which prompted her to call EMS.  She currently rates her pain 5/10. She was given ASA and 3 nitro en route to mild relief. Pt was diagnosed with PE two months ago. Pt is on Xarelto daily. She reports hx of stress test, but no other significant heart hx. She denies diaphoresis, SOB, nausea, or vomiting.   Past Medical History:  Diagnosis Date  . Acid reflux   . Diabetes mellitus without complication ([place])   . High cholesterol   . Hx of chest tube placement right   . Hypertension   . Neuropathy ([place])   . Pneumonia     Patient Active Problem List   Diagnosis Date Noted  . Syncope 05/18/2016  . Bilateral pulmonary embolism ([place]) 05/17/2016  . Hyperlipidemia 05/17/2016  . Depression 05/17/2016  . Diabetic peripheral neuropathy ([place]) 05/17/2016  . Type 2 diabetes mellitus ([place]) 05/17/2016  . Neuritis of upper extremity, C7 01/15/2015  . Urinary retention 01/14/2015  . Acute renal failure ([place]) 01/14/2015  . DM type 2, uncontrolled, with renal complications ([place]) 88/32/5498  . Hypertension 01/14/2015  . Bowel incontinence 01/14/2015  .  Numbness and tingling of right arm 01/14/2015    Past Surgical History:  Procedure Laterality Date  . TUBAL LIGATION      OB History    Gravida Para Term Preterm AB Living   5 4 4   1 4    SAB TAB Ectopic Multiple Live Births     1             Home Medications    Prior to Admission medications   Medication Sig Start Date End Date Taking? Authorizing Provider  amLODipine (NORVASC) 5 MG tablet Take 5 mg by mouth daily. Reported on 09/16/2015    Historical Provider, MD  blood glucose meter kit and supplies Dispense based on patient and insurance preference. Use four times with each meal and at bedtime. (FOR ICD-9 250.00, 250.01). 05/19/16   Mir Marry Guan, MD  cholecalciferol (VITAMIN D) 1000 units tablet Take 1,000 Units by mouth 2 (two) times daily.    Historical Provider, MD  clotrimazole (LOTRIMIN) 1 % cream Apply to affected area 2 times daily 06/03/16   Nehemiah Settle, NP  ezetimibe-simvastatin (VYTORIN) 10-10 MG tablet Take 1 tablet by mouth daily.    Historical Provider, MD  fluconazole (DIFLUCAN) 200 MG tablet Take one 251m tablet on day one and take second 2038mtablet on day 4. 06/03/16   CaNehemiah SettleNP  gabapentin (NEURONTIN) 400 MG capsule Take 400 mg by mouth 3 (three) times daily. 03/16/16  Historical Provider, MD  hydrochlorothiazide (MICROZIDE) 12.5 MG capsule Take 12.5 mg by mouth daily.    Historical Provider, MD  insulin aspart (NOVOLOG) 100 UNIT/ML injection Inject 5 Units into the skin 2 (two) times daily.    Historical Provider, MD  insulin glargine (LANTUS) 100 UNIT/ML injection Inject 0.15 mLs (15 Units total) into the skin 2 (two) times daily. 05/19/16   Mir Marry Guan, MD  loperamide (IMODIUM) 2 MG capsule Take 8 mg by mouth daily.    Historical Provider, MD  MELATONIN PO Take 1 tablet by mouth daily.    Historical Provider, MD  methocarbamol (ROBAXIN) 750 MG tablet Take 1,500 mg by mouth 2 (two) times daily as needed for muscle spasms.     Historical Provider, MD  metoprolol tartrate (LOPRESSOR) 25 MG tablet Take 25 mg by mouth 2 (two) times daily.    Historical Provider, MD  omeprazole (PRILOSEC) 40 MG capsule Take 40 mg by mouth daily. 03/16/16   Historical Provider, MD  oxyCODONE 10 MG TABS Take 1 tablet (10 mg total) by mouth every 6 (six) hours as needed for moderate pain. 05/19/16   Mir Marry Guan, MD  PARoxetine (PAXIL) 40 MG tablet Take 40 mg by mouth daily with breakfast.    Historical Provider, MD  Probiotic Product (ALIGN) 4 MG CAPS Take 1 capsule (4 mg total) by mouth daily. 06/03/16   Nehemiah Settle, NP  Rivaroxaban 15 & 20 MG TBPK Take as directed on package: Start with one 23m tablet by mouth twice a day with food. On Day 22, switch to one 221mtablet once a day with food. 05/19/16   Mir MoMarry GuanMD    Family History Family History  Problem Relation Age of Onset  . Pancreatitis Mother   . Diabetes type II Mother   . CAD Father   . Diabetes type II Father   . Diabetes type II Sister   . Diabetes type II Brother     Social History Social History  Substance Use Topics  . Smoking status: Former Smoker    Quit date: 09/11/2005  . Smokeless tobacco: Never Used  . Alcohol use No     Allergies   Lisinopril; Metformin and related; Reglan [metoclopramide]; Sulfa antibiotics; and Wellbutrin [bupropion]   Review of Systems Review of Systems  Constitutional: Negative for diaphoresis.  Respiratory: Negative for shortness of breath.   Cardiovascular: Positive for chest pain. Negative for leg swelling.  Gastrointestinal: Negative for nausea and vomiting.  All other systems reviewed and are negative.  Physical Exam Updated Vital Signs BP 148/90   Pulse 101   Temp 98 F (36.7 C) (Oral)   Resp 20   Ht 5' 4"  (1.626 m)   Wt 230 lb (104.3 kg)   SpO2 100%   BMI 39.48 kg/m   Physical Exam  Constitutional: She is oriented to person, place, and time. She appears well-developed and  well-nourished.  Obese  HENT:  Head: Normocephalic and atraumatic.  Cardiovascular: Regular rhythm and normal heart sounds.   Tachycardia  Pulmonary/Chest: Effort normal. No respiratory distress. She has no wheezes.  Abdominal: Soft. Bowel sounds are normal. There is no tenderness. There is no guarding.  Neurological: She is alert and oriented to person, place, and time.  Skin: Skin is warm and dry.  Psychiatric: She has a normal mood and affect.  Nursing note and vitals reviewed.  ED Treatments / Results  DIAGNOSTIC STUDIES: Oxygen Saturation is 99% on RA, normal by my  interpretation.    COORDINATION OF CARE: 2:59 AM-Discussed treatment plan which includes labs, EKG, and x-ray with pt at bedside and pt agreed to plan.    Labs (all labs ordered are listed, but only abnormal results are displayed) Labs Reviewed  BASIC METABOLIC PANEL - Abnormal; Notable for the following:       Result Value   Glucose, Bld 331 (*)    BUN 29 (*)    Creatinine, Ser 1.66 (*)    Calcium 8.3 (*)    GFR calc non Af Amer 34 (*)    GFR calc Af Amer 40 (*)    All other components within normal limits  CBC - Abnormal; Notable for the following:    RBC 3.71 (*)    Hemoglobin 10.9 (*)    HCT 33.1 (*)    All other components within normal limits  I-STAT TROPOININ, ED    EKG  EKG Interpretation  Date/Time:  Sunday July 23 2016 02:42:22 EST Ventricular Rate:  118 PR Interval:    QRS Duration: 87 QT Interval:  343 QTC Calculation: 481 R Axis:   68 Text Interpretation:  Sinus tachycardia Confirmed by Dina Rich  MD, [place] ([id number]) on 07/23/2016 2:45:00 AM       Radiology Dg Chest 2 View  Result Date: 07/23/2016 CLINICAL DATA:  55 y/o F; left-sided chest pain radiating down the left arm for 2 hours. Shortness of breath. EXAM: CHEST  2 VIEW COMPARISON:  05/17/2016 chest radiograph. FINDINGS: The heart size and mediastinal contours are within normal limits and stable. Both lungs are clear. The  visualized skeletal structures are unremarkable. IMPRESSION: No active cardiopulmonary disease. Electronically Signed   By: Kristine Garbe M.D.   On: 07/23/2016 03:16    Procedures Procedures (including critical care time)  Medications Ordered in ED Medications  nitroGLYCERIN (NITROGLYN) 2 % ointment 1 inch (1 inch Topical Given 07/23/16 0336)     Initial Impression / Assessment and Plan / ED Course  I have reviewed the triage vital signs and the nursing notes.  Pertinent labs & imaging results that were available during my care of the patient were reviewed by me and considered in my medical decision making (see chart for details).  Clinical Course as of Jul 23 413  Sun Jul 23, 2016  0408 Chest pain now 0 out of 10 after applying nitroglycerin paste. Heart score is 3-4 given risk factors and story. However, she also has PE is tachycardic. Plan for admission for chest pain rule out. Will discuss repeat CT angio with admitting hospitalist.  [CH]    Clinical Course User Index [CH] Merryl Hacker, MD    Patient presents with chest pain. Reports pressure for the last hour. History of PE but reports compliance with her Xarelto.  Improved with nitroglycerin in route. Nitroglycerin paste applied. She was given aspirin. EKG is sinus tachycardia without acute ischemic changes. Initial troponin negative. See course above. Patient improved following nitroglycerin paste. Remains mildly tachycardic. Given factors and heart score, feel she needs to be admitted for formal rule out. Will discuss repeat CT angio with the admitting hospitalist.Of note, on patient's CT in September, she was noted to have coronary artery calcifications.  4:29 AM Discussed with Dr. Eulas Post. We will hold off imaging given patient's renal function.  Final Clinical Impressions(s) / ED Diagnoses   Final diagnoses:  Other chest pain   I personally performed the services described in this documentation, which was  scribed in my presence. The recorded  information has been reviewed and is accurate.   New Prescriptions New Prescriptions   No medications on file     Merryl Hacker, MD 07/23/16 2230    Merryl Hacker, MD 07/23/16 0430

## 2016-07-23 NOTE — Progress Notes (Addendum)
Patient seen and examined   55 y.o. female with a PMH significant for morbid obesity, IDDM, HTN, dyslipidemia, coronary calcifications on CT, GERD, and recently diagnosed bilateral PEs who presented with acute onset chest pain.  The pt has been in a limited stable of health over the past 2 months following her diagnosis of PE, notably ambulating with the assistance of a walker and requiring homecare nursing. As per cardiology chest pain consistent with unstable angina. Currently on a heparin drip Plan of care reviewed. CVG uncontrolled, increase Lantus, hemoglobin A1c 9.9 on 05/19/16 continue to cycle cardiac enzymes. Check lipid panel, currently chest pain free, may need a  Cath in am

## 2016-07-23 NOTE — ED Triage Notes (Signed)
Patient comes by EMS for chest pain that start at 0200. Pain rated 5/10.  EMS gave 750 of fluids for tachycardia of 150.  ASA given and 3 nitro.  Patient A&Ox4 at this time.

## 2016-07-23 NOTE — Consult Note (Signed)
CARDIOLOGY CONSULT NOTE   Patient ID: Tamara Henry MRN: [id number] DOB/AGE: April 26, 1961 55 y.o.  Admit date: 07/23/2016  Requesting Physician: Primary Physician:   Philis Fendt, MD Primary Cardiologist:   None Reason for Consultation:   Chest Pain  HPI: Tamara Henry is a 55 y.o. female with a PMH significant for morbid obesity, IDDM, HTN, dyslipidemia, coronary calcifications on CT, GERD, and recently diagnosed bilateral PEs who presented with acute onset chest pain.  The pt has been in a limited stable of health over the past 2 months following her diagnosis of PE, notably ambulating with the assistance of a walker and requiring homecare nursing.  She has had DOE since that time, but notices mild improvement over recent weeks.  She was watching television at 3am when she experienced acute onset chest discomfort in the left upper chest with radiation down the left arm accompanied by parasthesias.  The discomfort was in the same location and of the same character, "sharp, stabbing", as the discomfort she experienced with her PE but was of greater intensity last night.  The discomfort was persistent until approximately 30 minutes ago, after receiving several SLNG and then 1in of nitro paste.  She was sleeping in bed at the time of my encounter and when awoken she denied active CP.  She has not experienced exertional chest discomfort over recent days, and endorses adherence to her prescribed medication regimen although notably her blood sugar and BP have been more poorly controlled.   Past Medical History:  Diagnosis Date  . Acid reflux   . Diabetes mellitus without complication ([place])   . High cholesterol   . Hx of chest tube placement right   . Hypertension   . Neuropathy ([place])   . Pneumonia      Past Surgical History:  Procedure Laterality Date  . TUBAL LIGATION      Allergies  Allergen Reactions  . Lisinopril Other (See Comments)    Inflammation, coughing  . Metformin  And Related Diarrhea  . Reglan [Metoclopramide] Other (See Comments)    Pt stated having lock jaw as a side effect.   . Sulfa Antibiotics Itching  . Wellbutrin [Bupropion] Cough    I have reviewed the patient's current medications     Prior to Admission medications   Medication Sig Start Date End Date Taking? Authorizing Provider  amLODipine (NORVASC) 5 MG tablet Take 5 mg by mouth daily. Reported on 09/16/2015    Historical Provider, MD  blood glucose meter kit and supplies Dispense based on patient and insurance preference. Use four times with each meal and at bedtime. (FOR ICD-9 250.00, 250.01). 05/19/16   Mir Marry Guan, MD  cholecalciferol (VITAMIN D) 1000 units tablet Take 1,000 Units by mouth 2 (two) times daily.    Historical Provider, MD  clotrimazole (LOTRIMIN) 1 % cream Apply to affected area 2 times daily 06/03/16   Nehemiah Settle, NP  ezetimibe-simvastatin (VYTORIN) 10-10 MG tablet Take 1 tablet by mouth daily.    Historical Provider, MD  fluconazole (DIFLUCAN) 200 MG tablet Take one 227m tablet on day one and take second 2035mtablet on day 4. 06/03/16   CaNehemiah SettleNP  gabapentin (NEURONTIN) 400 MG capsule Take 400 mg by mouth 3 (three) times daily. 03/16/16   Historical Provider, MD  hydrochlorothiazide (MICROZIDE) 12.5 MG capsule Take 12.5 mg by mouth daily.    Historical Provider, MD  insulin aspart (NOVOLOG) 100 UNIT/ML injection Inject 5 Units into  the skin 2 (two) times daily.    Historical Provider, MD  insulin glargine (LANTUS) 100 UNIT/ML injection Inject 0.15 mLs (15 Units total) into the skin 2 (two) times daily. 05/19/16   Mir Marry Guan, MD  loperamide (IMODIUM) 2 MG capsule Take 8 mg by mouth daily.    Historical Provider, MD  MELATONIN PO Take 1 tablet by mouth daily.    Historical Provider, MD  methocarbamol (ROBAXIN) 750 MG tablet Take 1,500 mg by mouth 2 (two) times daily as needed for muscle spasms.    Historical Provider, MD  metoprolol  tartrate (LOPRESSOR) 25 MG tablet Take 25 mg by mouth 2 (two) times daily.    Historical Provider, MD  omeprazole (PRILOSEC) 40 MG capsule Take 40 mg by mouth daily. 03/16/16   Historical Provider, MD  oxyCODONE 10 MG TABS Take 1 tablet (10 mg total) by mouth every 6 (six) hours as needed for moderate pain. 05/19/16   Mir Marry Guan, MD  PARoxetine (PAXIL) 40 MG tablet Take 40 mg by mouth daily with breakfast.    Historical Provider, MD  Probiotic Product (ALIGN) 4 MG CAPS Take 1 capsule (4 mg total) by mouth daily. 06/03/16   Nehemiah Settle, NP  Rivaroxaban 15 & 20 MG TBPK Take as directed on package: Start with one 19m tablet by mouth twice a day with food. On Day 22, switch to one 227mtablet once a day with food. 05/19/16   Mir MoMarry GuanMD     Social History   Social History  . Marital status: Married    Spouse name: Tamara Henry  . Number of children: 4  . Years of education: 12   Occupational History  . disabled    Social History Main Topics  . Smoking status: Former Smoker    Quit date: 09/11/2005  . Smokeless tobacco: Never Used  . Alcohol use No  . Drug use: No  . Sexual activity: Yes    Birth control/ protection: Surgical   Other Topics Concern  . Not on file   Social History Narrative   Lives at home with daughter NaLanelle Bal Drinks no caffeine     Family Status  Relation Status  . Mother Deceased  . Father Deceased  . Sister Alive  . Brother Alive   Family History  Problem Relation Age of Onset  . Pancreatitis Mother   . Diabetes type II Mother   . CAD Father   . Diabetes type II Father   . Diabetes type II Sister   . Diabetes type II Brother     ROS:  Full 14 point review of systems complete and found to be negative unless listed above.  Physical Exam: Blood pressure 149/94, pulse 101, temperature 98 F (36.7 C), temperature source Oral, resp. rate 21, height 5' 4"  (1.626 m), weight 104.3 kg (230 lb), SpO2 97 %.  General: morbidly obese, no  acute distress, AAOx3 Head: Eyes PERRLA, No xanthomas.   Normocephalic and atraumatic, oropharynx without edema or exudate. Dentition:  Lungs: CTAB, no w/r/c Heart: tachycardic, regular rhythm, +S1 +S2, no appreciable m/r/g, no JVD, no pitting LE edema  Neck: No carotid bruits. No lymphadenopathy.  JVD. Abdomen: obese but soft, NTND Msk:  No spine or cva tenderness. No weakness, no joint deformities or effusions. Extremities: No clubbing or cyanosis.  edema.  Neuro: Alert and oriented X 3. No focal deficits noted. Psych:  Good affect, responds appropriately Skin: No rashes or lesions noted.  Labs:  Lab Results  Component Value Date   WBC 9.1 07/23/2016   HGB 10.9 (L) 07/23/2016   HCT 33.1 (L) 07/23/2016   MCV 89.2 07/23/2016   PLT 240 07/23/2016   No results for input(s): INR in the last 72 hours.  Recent Labs Lab 07/23/16 0250  NA 137  K 4.1  CL 106  CO2 22  BUN 29*  CREATININE 1.66*  CALCIUM 8.3*  GLUCOSE 331*   Magnesium  Date Value Ref Range Status  01/14/2015 1.7 1.7 - 2.4 mg/dL Final    Recent Labs  07/23/16 0519  TROPONINI 0.04*    Recent Labs  07/23/16 0305  TROPIPOC 0.02   No results found for: PROBNP No results found for: CHOL, HDL, LDLCALC, TRIG Lab Results  Component Value Date   DDIMER 2.18 (H) 05/17/2016   Lipase  Date/Time Value Ref Range Status  06/20/2013 03:05 PM 41 11 - 59 U/L Final   TSH  Date/Time Value Ref Range Status  01/14/2015 06:13 AM 0.307 (L) 0.350 - 4.500 uIU/mL Final   No results found for: VITAMINB12, FOLATE, FERRITIN, TIBC, IRON, RETICCTPCT  Echo: PENDING  ECG:  Per my review, both tracings obtained since arrival demonstrate sinus tachycardia with prolonged QTc but otherwise normal intervals and normal axis, no ST or Twave changes indicative of ischemia, no pathologic Qwaves  Radiology:  Dg Chest 2 View  Result Date: 07/23/2016 CLINICAL DATA:  55 y/o F; left-sided chest pain radiating down the left arm for 2  hours. Shortness of breath. EXAM: CHEST  2 VIEW COMPARISON:  05/17/2016 chest radiograph. FINDINGS: The heart size and mediastinal contours are within normal limits and stable. Both lungs are clear. The visualized skeletal structures are unremarkable. IMPRESSION: No active cardiopulmonary disease. Electronically Signed   By: Kristine Garbe M.D.   On: 07/23/2016 03:16    ASSESSMENT AND PLAN:    Principal Problem:   Unstable angina ([place]) Active Problems:   DM type 2, uncontrolled, with renal complications ([place])   Hypertension   Bilateral pulmonary embolism (H[facility])   Hyperlipidemia   Diabetic peripheral neuropathy ([place])   Chest pain  Kennley Schwandt is a 54 y.o. female with a PMH significant for morbid obesity, IDDM, HTN, dyslipidemia, coronary calcifications on CT, GERD, and recently diagnosed bilateral PEs who presented with acute onset chest pain.  She has a high pretest probability of CAD given her obesity, poorly controlled DM, HTN, and dyslipidemia and on my review recent CT chest demonstrates coronary calcium in all 3 epicardial vessels.  The second troponin will be helpful in guiding active management as another negative value despite > 3 hours of continuous pain prior to relief would be reassuring.  In the meantime agree with Dr. Vale Haven plan to transition from rivaroxaban to a heparin gtt, and I would suggest other management for possible UA as per below.  If troponin remains negative then could also consider repeat CTPA to evaluate for progression of pulmonary arterial clot burden given the similarity of chest pain this presentation to last, although clearly IV contrast is an issue given GFR.  # Chest Pain c/f UA; significant risk factors and known coronary calcification as per above - ASA 340m by EMS, start 872mPO QDAY now - heparin gtt as per Dr. CaEulas Post hold P2Y12 inhibitor given possibility for multi-vessel disease as a poorly controlled diabetic and possible need for CABG -  continue metoprolol 251mO BID and if tolerated by BP consider increasing to reduce rate of sinus tachycardia -  discontinue Simvastatin, start Atorvastatin 76m PO QHS.  Indication for high intensity statin therapy for possible ACS but also given DM and ASCVD risk >7.5% - agree with nitro paste for now - repeat troponin 4 hours after first draw to determine trend - repeat ECG if CP recurs - continuous telemetry - TTE - NPO for possible cath if troponin increases   # HTN; SBP>1517mg, patient states this is her recent baseline, current ambulatory regimen is not maximized as there is room to titrate all 3 current prescribed medications - increase amlodipine to 10 mg PO QDAY - continue metoprolol as noted above - increase HCTZ to 25 mg PO QDAY  # Dyslipidemia - meets criteria for high-intensity statin therapy but AHA/ACC guidelines and has no clear contraindication, suggest Atorvastatin 8069mO QDAY - continue ezetimibe  # IDDM; poorly controlled, last A1C=9.9 and BG>300 today - management per primary team  # Multilobar segmental and subsegmental PE; if cardiac w/u is unrevealing, then consideration of repeat CTPA to evaluate clot progression - TTE in the setting of possible ACS as per above, but also to evaluate RV for strain/dilatation  Signed: PauClayborne DanaD 07/23/2016 6:25 AM

## 2016-07-23 NOTE — H&P (Signed)
History and Physical    Tamara Henry JZP:[id number] DOB: 1961-02-08 DOA: 07/23/2016  PCP: Philis Fendt, MD   Patient coming from: Home  Chief Complaint: Chest pain  HPI: Tamara Henry is a 55 y.o. woman with a history of uncontrolled Type 2 DM (A1c in September 10%) with neuropathy and CKD 3, HTN, dyslipidemia, morbid obesity, and recent diagnosis of bilateral subsegmental PE (diagnosed in September, she reports compliance with Xarelto therapy) who presents to the ED complaining of chest pain that is left sided (she points to her left pectoral region) that radiates to her left arm and is associated with numbness in that arm as well as shortness of breath and nausea.  Onset while watching TV.  She called 911, and received full strength aspirin as well as 2 SL NTG en route.  However, she was not chest pain free until one inch of nitropaste was placed in the ED.  EKG showed sinus tachycardia, that has improved after NS bolus.  No acute ST segment changes.  First troponin negative.  Creatinine 1.6 (appears to be a new baseline).  Of note, she denies chest pain associated with her diagnosis of PE in September.  That presentation was for syncope, which occurred while she was in the shower.  She had a positive D-Dimer at that time, which led to the CTA.  There were also incidental findings of coronary calcifications, as well as a thyroid nodule, on that study.    She has not had new leg pain or swelling.  However, she also reports two months of exertional dyspnea that has never been evaluated.  ED Course: During my examination, the patient developed recurrent chest pain with radiation to her left shoulder.  EKG unchanged.  Repeat troponin pending.  We will give IV morphine x 1.  Will plan to admit to the stepdown unit due to concerns for unstable angina.  Will also plan to anticoagulate with IV heparin per protocol for now (HOLD Xarelto).  Review of Systems: As per HPI otherwise 10 point review of  systems negative.    Past Medical History:  Diagnosis Date  . Acid reflux   . Diabetes mellitus without complication ([place])   . High cholesterol   . Hx of chest tube placement right   . Hypertension   . Neuropathy ([place])   . Pneumonia     Past Surgical History:  Procedure Laterality Date  . TUBAL LIGATION       reports that she quit smoking about 10 years ago. She has never used smokeless tobacco. She reports that she does not drink alcohol or use drugs.  Allergies  Allergen Reactions  . Lisinopril Other (See Comments)    Inflammation, coughing  . Metformin And Related Diarrhea  . Reglan [Metoclopramide] Other (See Comments)    Pt stated having lock jaw as a side effect.   . Sulfa Antibiotics Itching  . Wellbutrin [Bupropion] Cough    Family History  Problem Relation Age of Onset  . Pancreatitis Mother   . Diabetes type II Mother   . CAD Father   . Diabetes type II Father   . Diabetes type II Sister   . Diabetes type II Brother   Father died of complications related to MI in his 88s. Her son has had recurrent PE and MI at age 2.  She also reports that he has been noncompliant with anticoagulation therapy.   Prior to Admission medications   Medication Sig Start Date End Date Taking? Authorizing  Provider  amLODipine (NORVASC) 5 MG tablet Take 5 mg by mouth daily. Reported on 09/16/2015    Historical Provider, MD  blood glucose meter kit and supplies Dispense based on patient and insurance preference. Use four times with each meal and at bedtime. (FOR ICD-9 250.00, 250.01). 05/19/16   Mir Marry Guan, MD  cholecalciferol (VITAMIN D) 1000 units tablet Take 1,000 Units by mouth 2 (two) times daily.    Historical Provider, MD  clotrimazole (LOTRIMIN) 1 % cream Apply to affected area 2 times daily 06/03/16   Nehemiah Settle, NP  ezetimibe-simvastatin (VYTORIN) 10-10 MG tablet Take 1 tablet by mouth daily.    Historical Provider, MD  fluconazole (DIFLUCAN) 200 MG tablet  Take one 259m tablet on day one and take second 205mtablet on day 4. 06/03/16   CaNehemiah SettleNP  gabapentin (NEURONTIN) 400 MG capsule Take 400 mg by mouth 3 (three) times daily. 03/16/16   Historical Provider, MD  hydrochlorothiazide (MICROZIDE) 12.5 MG capsule Take 12.5 mg by mouth daily.    Historical Provider, MD  insulin aspart (NOVOLOG) 100 UNIT/ML injection Inject 5 Units into the skin 2 (two) times daily.    Historical Provider, MD  insulin glargine (LANTUS) 100 UNIT/ML injection Inject 0.15 mLs (15 Units total) into the skin 2 (two) times daily. 05/19/16   Mir MoMarry GuanMD  loperamide (IMODIUM) 2 MG capsule Take 8 mg by mouth daily.    Historical Provider, MD  MELATONIN PO Take 1 tablet by mouth daily.    Historical Provider, MD  methocarbamol (ROBAXIN) 750 MG tablet Take 1,500 mg by mouth 2 (two) times daily as needed for muscle spasms.    Historical Provider, MD  metoprolol tartrate (LOPRESSOR) 25 MG tablet Take 25 mg by mouth 2 (two) times daily.    Historical Provider, MD  omeprazole (PRILOSEC) 40 MG capsule Take 40 mg by mouth daily. 03/16/16   Historical Provider, MD  oxyCODONE 10 MG TABS Take 1 tablet (10 mg total) by mouth every 6 (six) hours as needed for moderate pain. 05/19/16   Mir MoMarry GuanMD  PARoxetine (PAXIL) 40 MG tablet Take 40 mg by mouth daily with breakfast.    Historical Provider, MD  Probiotic Product (ALIGN) 4 MG CAPS Take 1 capsule (4 mg total) by mouth daily. 06/03/16   CaNehemiah SettleNP  Rivaroxaban 15 & 20 MG TBPK Take as directed on package: Start with one 1521mablet by mouth twice a day with food. On Day 22, switch to one 48m78mblet once a day with food. 05/19/16   Mir MohaMarry Guan    Physical Exam: Vitals:   07/23/16 0300 07/23/16 0330 07/23/16 0400 07/23/16 0530  BP: 123/74 148/90 142/86 140/87  Pulse: 107 101 102 104  Resp:  _0 Temp:      TempSrc:      SpO2: 100% 100% 97% 98%  Weight:      Height:           Constitutional: NAD, calm, grimacing with active chest pain Vitals:   07/23/16 0300 07/23/16 0330 07/23/16 0400 07/23/16 0530  BP: 123/74 148/90 142/86 140/87  Pulse: 107 101 102 104  Resp:  _1 Temp:      TempSrc:      SpO2: 100% 100% 97% 98%  Weight:      Height:       Eyes: PERRL, lids and conjunctivae normal ENMT: Mucous membranes are very dry.  Posterior pharynx clear of any exudate or lesions. Poor dentition.  Neck: normal appearance, supple, no masses Respiratory: clear to auscultation bilaterally, no wheezing, no crackles. Normal respiratory effort. No accessory muscle use.  Cardiovascular: Mildly tachycardic but regular.  No murmurs / rubs / gallops. No extremity edema. 2+ pedal pulses. No carotid bruits.  GI: abdomen is obese but soft and compressible.  No distention.  No tenderness.  Bowel sounds are present. Musculoskeletal:  No joint deformity in upper and lower extremities. Good ROM, no contractures. Normal muscle tone.  Skin: no rashes, warm and dry Neurologic: No focal deficits. Psychiatric: Normal judgment and insight. Alert and oriented x 3. Normal mood.     Labs on Admission: I have personally reviewed following labs and imaging studies  CBC:  Recent Labs Lab 07/23/16 0250  WBC 9.1  HGB 10.9*  HCT 33.1*  MCV 89.2  PLT 272   Basic Metabolic Panel:  Recent Labs Lab 07/23/16 0250  NA 137  K 4.1  CL 106  CO2 22  GLUCOSE 331*  BUN 29*  CREATININE 1.66*  CALCIUM 8.3*   GFR: Estimated Creatinine Clearance: 46.1 mL/min (by C-G formula based on SCr of 1.66 mg/dL (H)).  Radiological Exams on Admission: Dg Chest 2 View  Result Date: 07/23/2016 CLINICAL DATA:  55 y/o F; left-sided chest pain radiating down the left arm for 2 hours. Shortness of breath. EXAM: CHEST  2 VIEW COMPARISON:  05/17/2016 chest radiograph. FINDINGS: The heart size and mediastinal contours are within normal limits and stable. Both lungs are clear. The visualized  skeletal structures are unremarkable. IMPRESSION: No active cardiopulmonary disease. Electronically Signed   By: Kristine Garbe M.D.   On: 07/23/2016 03:16    EKG: Independently reviewed. Sinus Tachycardia.  No acute ST segment changes.  Assessment/Plan Principal Problem:   Unstable angina ([facility]) Active Problems:   DM type 2, uncontrolled, with renal complications ([facility])   Hypertension   Bilateral pulmonary embolism ([facility])   Hyperlipidemia   Diabetic peripheral neuropathy ([facility])   Chest pain      Unstable angina --Observe in stepdown unit --Will switch to IV heparin infusion per ACS protocol (last dose of Xarelto yesterday morning around 10AM).  Discussed with pharmacist. --Cardiology consult requested (I think patient is high risk based on known risk factors, clinical history, and coronary calcifications on CTA chest in September). --She has received full strength aspirin.  Continue baby aspirin daily for now. --She is on a BB and statin at baseline. --Continue 1 inch nitropaste to chest wall for now --IV morphine prn for chest pain refractory to nitroglycerin --Repeat echo in the AM to assess for wall motion changes compared to September (echo done then to rule out right heart strain as it related to PE) --NPO for now  Uncontrolled Type 2 DM --Continue home dose of levemir --Modest sliding scale since NPO and renal insufficiency  Diabetic neuropathy --Continue home dose of neurotin  CKD 3, I think Creatinine around 1.5-1.6 may be her new baseline --HOLD HCTZ --Gentle hydration --Monitor renal function  History of depression --Continue Paxil  HTN --Amlodipine, metoprolol  Bilateral subsegmental PE --HOLD Xarelto for now (though patient reports compliance with therapy).  Anticoagulate with IV heparin due to concerns for ACS at this time. --Low index of suspicion for recurrent PE at this time.     DVT prophylaxis: FULL anticoagulation with IV heparin  infusion per protocol, HOLD Xarelto Code Status: FULL Family Communication: Patient alone in the ED at time of  admission. Disposition Plan: To be determined. Consults called: Cardiology (fellow) Admission status: Place in observation, stepdown unit due to active chest pain   TIME SPENT: 70 minutes   Eber Jones MD [facility] Pager [phone]  If 7PM-7AM, please contact night-coverage [email or web address] Password [facility]  07/23/2016, 5:34 AM

## 2016-07-23 NOTE — ED Notes (Signed)
Daughter Holley Bouche  [phone]

## 2016-07-23 NOTE — Progress Notes (Signed)
ANTICOAGULATION CONSULT NOTE - Initial Consult  Pharmacy Consult for heparin Indication: chest pain/ACS  Allergies  Allergen Reactions  . Lisinopril Other (See Comments)    Inflammation, coughing  . Metformin And Related Diarrhea  . Reglan [Metoclopramide] Other (See Comments)    Pt stated having lock jaw as a side effect.   . Sulfa Antibiotics Itching  . Wellbutrin [Bupropion] Cough    Patient Measurements: Height: 5\' 4"  (162.6 cm) Weight: 230 lb (104.3 kg) IBW/kg (Calculated) : 54.7 Heparin Dosing Weight: 79.2kg  Vital Signs: Temp: 97.3 F (36.3 C) (11/12 0648) Temp Source: Oral (11/12 0648) BP: 149/94 (11/12 0600) Pulse Rate: 101 (11/12 0600)  Labs:  Recent Labs  07/23/16 0250 07/23/16 0519  HGB 10.9*  --   HCT 33.1*  --   PLT 240  --   CREATININE 1.66*  --   TROPONINI  --  0.04*    Estimated Creatinine Clearance: 46.1 mL/min (by C-G formula based on SCr of 1.66 mg/dL (H)).   Medical History: Past Medical History:  Diagnosis Date  . Acid reflux   . Diabetes mellitus without complication ([place])   . High cholesterol   . Hx of chest tube placement right   . Hypertension   . Neuropathy ([place])   . Pneumonia     Assessment: -On Xarelto at home for h/o bilateral PE, heparin per pharmacy currently -Hgb 10.9, Plts WNL -No signs of bleeding  Goal of Therapy:  Heparin level 0.3-0.7 units/ml Monitor platelets by anticoagulation protocol: Yes   Plan:  -Heparin 4000 units bolus x1 -Start heparin infusion at 1000 units/hr -6 hour HL at 1400 -Daily CBC, HL -Monitor S/Sx of bleeding  Myer Peer Grayland Ormond), PharmD  PGY1 Pharmacy Resident Pager: [phone] 07/23/2016 7:43 AM

## 2016-07-23 NOTE — ED Notes (Signed)
Updated floor that patient will be upgraded to stepdown, plan to go to same room.

## 2016-07-23 NOTE — Progress Notes (Signed)
ANTICOAGULATION CONSULT NOTE - Initial Consult  Pharmacy Consult for heparin Indication: chest pain/ACS  Allergies  Allergen Reactions  . Lactose Intolerance (Gi) Diarrhea  . Lisinopril Other (See Comments) and Cough    Inflammation, coughing  . Metformin And Related Diarrhea  . Reglan [Metoclopramide] Other (See Comments)    Pt stated having lock jaw as a side effect  . Sulfa Antibiotics Itching  . Wellbutrin [Bupropion] Nausea And Vomiting and Cough  . Latex Rash  . Penicillins Rash    ALL -CILINS Has patient had a PCN reaction causing immediate rash, facial/tongue/throat swelling, SOB or lightheadedness with hypotension: Yes Has patient had a PCN reaction causing severe rash involving mucus membranes or skin necrosis: No Has patient had a PCN reaction that required hospitalization No Has patient had a PCN reaction occurring within the last 10 years: Yes If all of the above answers are "NO", then may proceed with Cephalosporin use.   . Tape Rash    Patient Measurements: Height: 5\' 4"  (162.6 cm) Weight: 230 lb (104.3 kg) IBW/kg (Calculated) : 54.7 Heparin Dosing Weight: 79.2kg  Vital Signs: Temp: 98.3 F (36.8 C) (11/12 1627) Temp Source: Oral (11/12 1627) BP: 166/91 (11/12 1136) Pulse Rate: 104 (11/12 1136)  Labs:  Recent Labs  07/23/16 0250 07/23/16 0519 07/23/16 0913 07/23/16 1403  HGB 10.9*  --  10.4*  --   HCT 33.1*  --  32.8*  --   PLT 240  --  215  --   APTT  --   --   --  61*  LABPROT  --   --  18.9*  --   INR  --   --  1.57  --   HEPARINUNFRC  --   --   --  1.00*  CREATININE 1.66*  --   --   --   TROPONINI  --  0.04* 0.04* 0.04*    Estimated Creatinine Clearance: 46.1 mL/min (by C-G formula based on SCr of 1.66 mg/dL (H)).  Assessment: 3 YOF admitted with chest pain from home that radiates to left arm and associated with numbness, SOB, and nausea.   PMH: T2DM with neuropathy, stage 3 CKD, HTN, HLD, recent bilateral PE on Xarelto  On xarelto  at home with last dose yesterday am. Effects likely still in patient  Initial HL 1 with aptt of 61 - not correlating so need to follow aptt for now  Goal of Therapy:  aptt 66-102 s  Heparin level 0.3-0.7 units/ml Monitor platelets by anticoagulation protocol: Yes   Plan:  Increase heparin to 1150 units/hr 2300 aptt Daily HL, aptt (until correlating)  Levester Fresh, PharmD, BCPS, [facility] Clinical Pharmacist Pager [phone] 07/23/2016 4:33 PM

## 2016-07-24 ENCOUNTER — Observation Stay (HOSPITAL_COMMUNITY): Payer: Medicare Other

## 2016-07-24 DIAGNOSIS — J90 Pleural effusion, not elsewhere classified: Secondary | ICD-10-CM | POA: Diagnosis not present

## 2016-07-24 DIAGNOSIS — F339 Major depressive disorder, recurrent, unspecified: Secondary | ICD-10-CM | POA: Diagnosis not present

## 2016-07-24 DIAGNOSIS — I25119 Atherosclerotic heart disease of native coronary artery with unspecified angina pectoris: Secondary | ICD-10-CM | POA: Diagnosis not present

## 2016-07-24 DIAGNOSIS — I2699 Other pulmonary embolism without acute cor pulmonale: Secondary | ICD-10-CM | POA: Diagnosis not present

## 2016-07-24 DIAGNOSIS — I249 Acute ischemic heart disease, unspecified: Secondary | ICD-10-CM | POA: Diagnosis present

## 2016-07-24 DIAGNOSIS — I4581 Long QT syndrome: Secondary | ICD-10-CM | POA: Diagnosis present

## 2016-07-24 DIAGNOSIS — E1142 Type 2 diabetes mellitus with diabetic polyneuropathy: Secondary | ICD-10-CM | POA: Diagnosis not present

## 2016-07-24 DIAGNOSIS — I5022 Chronic systolic (congestive) heart failure: Secondary | ICD-10-CM | POA: Diagnosis not present

## 2016-07-24 DIAGNOSIS — E1121 Type 2 diabetes mellitus with diabetic nephropathy: Secondary | ICD-10-CM | POA: Diagnosis not present

## 2016-07-24 DIAGNOSIS — I200 Unstable angina: Secondary | ICD-10-CM

## 2016-07-24 DIAGNOSIS — Z8489 Family history of other specified conditions: Secondary | ICD-10-CM | POA: Diagnosis not present

## 2016-07-24 DIAGNOSIS — E782 Mixed hyperlipidemia: Secondary | ICD-10-CM | POA: Diagnosis not present

## 2016-07-24 DIAGNOSIS — J9811 Atelectasis: Secondary | ICD-10-CM | POA: Diagnosis not present

## 2016-07-24 DIAGNOSIS — I10 Essential (primary) hypertension: Secondary | ICD-10-CM | POA: Diagnosis not present

## 2016-07-24 DIAGNOSIS — E041 Nontoxic single thyroid nodule: Secondary | ICD-10-CM | POA: Diagnosis present

## 2016-07-24 DIAGNOSIS — R0602 Shortness of breath: Secondary | ICD-10-CM | POA: Diagnosis not present

## 2016-07-24 DIAGNOSIS — R079 Chest pain, unspecified: Secondary | ICD-10-CM | POA: Diagnosis not present

## 2016-07-24 DIAGNOSIS — E785 Hyperlipidemia, unspecified: Secondary | ICD-10-CM | POA: Diagnosis not present

## 2016-07-24 DIAGNOSIS — E7800 Pure hypercholesterolemia, unspecified: Secondary | ICD-10-CM

## 2016-07-24 DIAGNOSIS — E1165 Type 2 diabetes mellitus with hyperglycemia: Secondary | ICD-10-CM | POA: Diagnosis not present

## 2016-07-24 DIAGNOSIS — E1122 Type 2 diabetes mellitus with diabetic chronic kidney disease: Secondary | ICD-10-CM | POA: Diagnosis present

## 2016-07-24 DIAGNOSIS — I340 Nonrheumatic mitral (valve) insufficiency: Secondary | ICD-10-CM | POA: Diagnosis not present

## 2016-07-24 DIAGNOSIS — J984 Other disorders of lung: Secondary | ICD-10-CM | POA: Diagnosis not present

## 2016-07-24 DIAGNOSIS — Z833 Family history of diabetes mellitus: Secondary | ICD-10-CM | POA: Diagnosis not present

## 2016-07-24 DIAGNOSIS — Z87891 Personal history of nicotine dependence: Secondary | ICD-10-CM | POA: Diagnosis not present

## 2016-07-24 DIAGNOSIS — E1140 Type 2 diabetes mellitus with diabetic neuropathy, unspecified: Secondary | ICD-10-CM | POA: Diagnosis not present

## 2016-07-24 DIAGNOSIS — I2584 Coronary atherosclerosis due to calcified coronary lesion: Secondary | ICD-10-CM | POA: Diagnosis present

## 2016-07-24 DIAGNOSIS — I214 Non-ST elevation (NSTEMI) myocardial infarction: Secondary | ICD-10-CM | POA: Diagnosis not present

## 2016-07-24 DIAGNOSIS — K219 Gastro-esophageal reflux disease without esophagitis: Secondary | ICD-10-CM | POA: Diagnosis present

## 2016-07-24 DIAGNOSIS — Z6841 Body Mass Index (BMI) 40.0 and over, adult: Secondary | ICD-10-CM | POA: Diagnosis not present

## 2016-07-24 DIAGNOSIS — D62 Acute posthemorrhagic anemia: Secondary | ICD-10-CM | POA: Diagnosis not present

## 2016-07-24 DIAGNOSIS — N990 Postprocedural (acute) (chronic) kidney failure: Secondary | ICD-10-CM | POA: Diagnosis not present

## 2016-07-24 DIAGNOSIS — Z951 Presence of aortocoronary bypass graft: Secondary | ICD-10-CM | POA: Diagnosis not present

## 2016-07-24 DIAGNOSIS — I130 Hypertensive heart and chronic kidney disease with heart failure and stage 1 through stage 4 chronic kidney disease, or unspecified chronic kidney disease: Secondary | ICD-10-CM | POA: Diagnosis not present

## 2016-07-24 DIAGNOSIS — I2510 Atherosclerotic heart disease of native coronary artery without angina pectoris: Secondary | ICD-10-CM | POA: Diagnosis not present

## 2016-07-24 DIAGNOSIS — N183 Chronic kidney disease, stage 3 (moderate): Secondary | ICD-10-CM | POA: Diagnosis present

## 2016-07-24 DIAGNOSIS — Z01810 Encounter for preprocedural cardiovascular examination: Secondary | ICD-10-CM | POA: Diagnosis not present

## 2016-07-24 DIAGNOSIS — I25110 Atherosclerotic heart disease of native coronary artery with unstable angina pectoris: Secondary | ICD-10-CM | POA: Diagnosis not present

## 2016-07-24 DIAGNOSIS — F331 Major depressive disorder, recurrent, moderate: Secondary | ICD-10-CM | POA: Diagnosis not present

## 2016-07-24 DIAGNOSIS — R05 Cough: Secondary | ICD-10-CM | POA: Diagnosis not present

## 2016-07-24 DIAGNOSIS — E7801 Familial hypercholesterolemia: Secondary | ICD-10-CM | POA: Diagnosis not present

## 2016-07-24 DIAGNOSIS — E871 Hypo-osmolality and hyponatremia: Secondary | ICD-10-CM | POA: Diagnosis not present

## 2016-07-24 DIAGNOSIS — N170 Acute kidney failure with tubular necrosis: Secondary | ICD-10-CM | POA: Diagnosis not present

## 2016-07-24 DIAGNOSIS — R0789 Other chest pain: Secondary | ICD-10-CM | POA: Diagnosis not present

## 2016-07-24 DIAGNOSIS — A599 Trichomoniasis, unspecified: Secondary | ICD-10-CM | POA: Diagnosis not present

## 2016-07-24 LAB — CBC
HCT: 33.5 % — ABNORMAL LOW (ref 36.0–46.0)
Hemoglobin: 11.1 g/dL — ABNORMAL LOW (ref 12.0–15.0)
MCH: 29.4 pg (ref 26.0–34.0)
MCHC: 33.1 g/dL (ref 30.0–36.0)
MCV: 88.6 fL (ref 78.0–100.0)
PLATELETS: 222 10*3/uL (ref 150–400)
RBC: 3.78 MIL/uL — ABNORMAL LOW (ref 3.87–5.11)
RDW: 13.9 % (ref 11.5–15.5)
WBC: 8.4 10*3/uL (ref 4.0–10.5)

## 2016-07-24 LAB — GLUCOSE, CAPILLARY
GLUCOSE-CAPILLARY: 323 mg/dL — AB (ref 65–99)
GLUCOSE-CAPILLARY: 281 mg/dL — AB (ref 65–99)
GLUCOSE-CAPILLARY: 212 mg/dL — AB (ref 65–99)
GLUCOSE-CAPILLARY: 326 mg/dL — AB (ref 65–99)
Glucose-Capillary: 204 mg/dL — ABNORMAL HIGH (ref 65–99)
Glucose-Capillary: 250 mg/dL — ABNORMAL HIGH (ref 65–99)

## 2016-07-24 LAB — COMPREHENSIVE METABOLIC PANEL
ALBUMIN: 2.7 g/dL — AB (ref 3.5–5.0)
ALT: 15 U/L (ref 14–54)
ANION GAP: 12 (ref 5–15)
AST: 27 U/L (ref 15–41)
Alkaline Phosphatase: 124 U/L (ref 38–126)
BUN: 27 mg/dL — ABNORMAL HIGH (ref 6–20)
CALCIUM: 8.6 mg/dL — AB (ref 8.9–10.3)
CHLORIDE: 105 mmol/L (ref 101–111)
CO2: 19 mmol/L — AB (ref 22–32)
Creatinine, Ser: 1.19 mg/dL — ABNORMAL HIGH (ref 0.44–1.00)
GFR calc non Af Amer: 51 mL/min — ABNORMAL LOW (ref >60)
GFR, EST AFRICAN AMERICAN: 59 mL/min — AB (ref >60)
Glucose, Bld: 304 mg/dL — ABNORMAL HIGH (ref 65–99)
POTASSIUM: 4.6 mmol/L (ref 3.5–5.1)
SODIUM: 136 mmol/L (ref 135–145)
Total Bilirubin: 0.8 mg/dL (ref 0.3–1.2)
Total Protein: 6.3 g/dL — ABNORMAL LOW (ref 6.5–8.1)

## 2016-07-24 LAB — HEPARIN LEVEL (UNFRACTIONATED): Heparin Unfractionated: 0.64 IU/mL (ref 0.30–0.70)

## 2016-07-24 LAB — APTT
aPTT: 62 seconds — ABNORMAL HIGH (ref 24–36)
aPTT: 72 seconds — ABNORMAL HIGH (ref 24–36)

## 2016-07-24 MED ORDER — ASPIRIN 81 MG PO CHEW
81.0000 mg | CHEWABLE_TABLET | ORAL | Status: AC
  Administered 2016-07-25: 81 mg via ORAL
  Filled 2016-07-24: qty 1

## 2016-07-24 MED ORDER — OXYCODONE-ACETAMINOPHEN 5-325 MG PO TABS
1.0000 | ORAL_TABLET | ORAL | Status: DC | PRN
  Administered 2016-07-24 – 2016-07-27 (×7): 1 via ORAL
  Filled 2016-07-24 (×7): qty 1

## 2016-07-24 MED ORDER — INSULIN GLARGINE 100 UNIT/ML ~~LOC~~ SOLN
10.0000 [IU] | Freq: Once | SUBCUTANEOUS | Status: AC
  Administered 2016-07-24: 10 [IU] via SUBCUTANEOUS
  Filled 2016-07-24: qty 0.1

## 2016-07-24 MED ORDER — INFLUENZA VAC SPLIT QUAD 0.5 ML IM SUSY: 0.5000 mL | PREFILLED_SYRINGE | INTRAMUSCULAR | Status: DC

## 2016-07-24 MED ORDER — HEPARIN BOLUS VIA INFUSION
1500.0000 [IU] | Freq: Once | INTRAVENOUS | Status: AC
  Administered 2016-07-24: 1500 [IU] via INTRAVENOUS
  Filled 2016-07-24: qty 1500

## 2016-07-24 MED ORDER — FLUCONAZOLE 100 MG PO TABS
100.0000 mg | ORAL_TABLET | Freq: Once | ORAL | Status: AC
  Administered 2016-07-24: 100 mg via ORAL
  Filled 2016-07-24: qty 1

## 2016-07-24 MED ORDER — SODIUM CHLORIDE 0.9 % IV SOLN: INTRAVENOUS | Status: DC

## 2016-07-24 NOTE — Progress Notes (Signed)
ANTICOAGULATION CONSULT NOTE - Follow Up Consult  Pharmacy Consult for Heparin  Indication: chest pain/ACS, Hx PE (Xarelto on hold)  Patient Measurements: Height: 5\' 4"  (162.6 cm) Weight: 235 lb 14.4 oz (107 kg) IBW/kg (Calculated) : 54.7  Adjusted body weight: 80 kg  Vital Signs: Temp: 97.8 F (36.6 C) (11/13 1108) Temp Source: Oral (11/13 1108) BP: 160/95 (11/13 1108) Pulse Rate: 82 (11/13 1108)  Labs:  Recent Labs  07/23/16 0250 07/23/16 0519 07/23/16 0913 07/23/16 1403 07/23/16 2302 07/24/16 0519 07/24/16 1033  HGB 10.9*  --  10.4*  --   --  11.1*  --   HCT 33.1*  --  32.8*  --   --  33.5*  --   PLT 240  --  215  --   --  222  --   APTT  --   --   --  61* 51*  --  62*  LABPROT  --   --  18.9*  --   --   --   --   INR  --   --  1.57  --   --   --   --   HEPARINUNFRC  --   --   --  1.00*  --   --  0.64  CREATININE 1.66*  --   --   --   --  1.19*  --   TROPONINI  --  0.04* 0.04* 0.04*  --   --   --     Estimated Creatinine Clearance: 65.3 mL/min (by C-G formula based on SCr of 1.19 mg/dL (H)).   Assessment: Heparin for chest pain, undergoing cardiac work-up. Using aPTT to dose for now given outpatient Xarelto influence on heparin levels. Hgb 11.1 stable, pltc within normal.  APTT = 62 seconds, heparin level = 0.64 on IV heparin drip at 1350 units/hr. PTT is still subtherapeutic for h/o recent bilateral PE.  Xarelto last taken PTA on morning of 07/22/16 (MD notes last dose taken 11/12. I confirmed with patient that she last took Xarelto on 11/11 AM).  Plan for cardiac cath tomorrow 07/25/16.  Goal of Therapy:  Heparin level 0.3-0.7 units/ml aPTT 66-102 seconds      Plan:  Give heparin bolus 1500 unit IV bolus Increase Heparin drip to1550 units/hr Check 6 hr aPTT. Daily aPTT/HL   Nicole Cella, RPh Clinical Pharmacist Pager: [phone] 07/24/2016,11:17 AM

## 2016-07-24 NOTE — Progress Notes (Addendum)
ANTICOAGULATION CONSULT NOTE - Follow Up Consult  Pharmacy Consult for Heparin  Indication: chest pain/ACS, Hx PE (Xarelto on hold)  Patient Measurements: Height: 5\' 4"  (162.6 cm) Weight: 230 lb (104.3 kg) IBW/kg (Calculated) : 54.7  Vital Signs: Temp: 97.5 F (36.4 C) (11/13 0008) Temp Source: Oral (11/13 0008) BP: 143/89 (11/13 0008) Pulse Rate: 83 (11/13 0008)  Labs:  Recent Labs  07/23/16 0250 07/23/16 0519 07/23/16 0913 07/23/16 1403 07/23/16 2302  HGB 10.9*  --  10.4*  --   --   HCT 33.1*  --  32.8*  --   --   PLT 240  --  215  --   --   APTT  --   --   --  61* 51*  LABPROT  --   --  18.9*  --   --   INR  --   --  1.57  --   --   HEPARINUNFRC  --   --   --  1.00*  --   CREATININE 1.66*  --   --   --   --   TROPONINI  --  0.04* 0.04* 0.04*  --     Estimated Creatinine Clearance: 46.1 mL/min (by C-G formula based on SCr of 1.66 mg/dL (H)).   Assessment: Heparin for chest pain, undergoing cardiac work-up, aPTT remains low despite rate increase, using aPTT to dose for now given Xarelto influence on heparin levels. No issues per RN.   Goal of Therapy:  Heparin level 0.3-0.7 units/ml aPTT 66-102 seconds      Plan:  -Inc heparin to 1350 units/hr -0900 aPTT/HL  Narda Bonds 07/24/2016,12:54 AM

## 2016-07-24 NOTE — Progress Notes (Signed)
[facility] PROGRESS NOTE  Tamara Henry [id number] DOB: 31-Aug-1961 DOA: 07/23/2016   PCP: Philis Fendt, MD     Assessment/Plan: Principal Problem:   Unstable angina ([place]) Active Problems:   DM type 2, uncontrolled, with renal complications ([place])   Hypertension   Bilateral pulmonary embolism (HCC)   Hyperlipidemia   Diabetic peripheral neuropathy (HCC)   Chest pain   55 y.o.femalewith a PMH significant for morbid obesity, IDDM, HTN, dyslipidemia, coronary calcifications on CT, GERD, and recently diagnosed bilateral PEs who presented with acute onset chest pain. The pt has been in a limited stable of health over the past 2 months following her diagnosis of PE, notably ambulating with the assistance of a walker and requiring homecare nursing. As per cardiology chest pain consistent with unstable angina. Currently on a heparin drip  Assessment and plan Unstable angina/chest pain Continue heparin drip  (last dose of Xarelto 11/11 morning around 10AM).    --Cardiology consulted,recent CT chest demonstrates coronary calcium in all 3 epicardial vessels.   Continue aspirin, statin, metoprolol, Awaiting further recommendations from cardiology --Continue 1 inch nitropaste to chest wall for now --IV morphine prn for chest pain refractory to nitroglycerin 2-D echo pending --NPO for now  Dyslipidemia-continue statin  Insulin-dependent Type 2 DM --Continue home dose of levemir, hemoglobin A1c 9.9 on 05/19/16 Continue to adjust insulin regimen  Diabetic neuropathy --Continue home dose of neurotin  CKD 3, I think Creatinine around 1.5-1.6 , improved after IV fluids --HOLD HCTZ  continue IV fluids if the patient needs cardiac cath --Monitor renal function  History of depression --Continue Paxil  HTN --Amlodipine, metoprolol  Bilateral subsegmental PE --HOLD Xarelto for now  .  Anticoagulate with IV heparin due to concerns for ACS at this time. --Low  index of suspicion for recurrent PE at this time.     DVT prophylaxsis heparin drip  Code Status:  Full code      Family Communication: Discussed in detail with the patient, all imaging results, lab results explained to the patient   Disposition Plan: Disposition as per cardiology      Consultants:  Cardiology    Procedures:  None  Antibiotics: Anti-infectives    None         HPI/Subjective: Chest pain free  Objective: Vitals:   07/23/16 2058 07/24/16 0008 07/24/16 0500 07/24/16 0754  BP: (!) 146/82 (!) 143/89 (!) 151/77 (!) 149/93  Pulse: 82 83 84 91  Resp: 16 17 17 16   Temp: 97.6 F (36.4 C) 97.5 F (36.4 C) 98.9 F (37.2 C) 97.8 F (36.6 C)  TempSrc: Oral Oral Oral Oral  SpO2: 96% 97% 95% 99%  Weight:   107 kg (235 lb 14.4 oz)   Height:        Intake/Output Summary (Last 24 hours) at 07/24/16 0836 Last data filed at 07/23/16 2348  Gross per 24 hour  Intake           2013.9 ml  Output             1100 ml  Net            913.9 ml    Exam:  Examination:  General exam: Appears calm and comfortable  Respiratory system: Clear to auscultation. Respiratory effort normal. Cardiovascular system: S1 & S2 heard, RRR. No JVD, murmurs, rubs, gallops or clicks. No pedal edema. Gastrointestinal system: Abdomen is nondistended, soft and nontender. No organomegaly or masses felt. Normal bowel sounds heard.  Central nervous system: Alert and oriented. No focal neurological deficits. Extremities: Symmetric 5 x 5 power. Skin: No rashes, lesions or ulcers Psychiatry: Judgement and insight appear normal. Mood & affect appropriate.     Data Reviewed: I have personally reviewed following labs and imaging studies  Micro Results Recent Results (from the past 240 hour(s))  MRSA PCR Screening     Status: None   Collection Time: 07/23/16  7:22 AM  Result Value Ref Range Status   MRSA by PCR NEGATIVE NEGATIVE Final    Comment:        The GeneXpert MRSA  Assay (FDA approved for NASAL specimens only), is one component of a comprehensive MRSA colonization surveillance program. It is not intended to diagnose MRSA infection nor to guide or monitor treatment for MRSA infections.     Radiology Reports Dg Chest 2 View  Result Date: 07/23/2016 CLINICAL DATA:  55 y/o F; left-sided chest pain radiating down the left arm for 2 hours. Shortness of breath. EXAM: CHEST  2 VIEW COMPARISON:  05/17/2016 chest radiograph. FINDINGS: The heart size and mediastinal contours are within normal limits and stable. Both lungs are clear. The visualized skeletal structures are unremarkable. IMPRESSION: No active cardiopulmonary disease. Electronically Signed   By: Kristine Garbe M.D.   On: 07/23/2016 03:16     CBC  Recent Labs Lab 07/23/16 0250 07/23/16 0913 07/24/16 0519  WBC 9.1 7.9 8.4  HGB 10.9* 10.4* 11.1*  HCT 33.1* 32.8* 33.5*  PLT 240 215 222  MCV 89.2 91.1 88.6  MCH 29.4 28.9 29.4  MCHC 32.9 31.7 33.1  RDW 13.4 13.3 13.9    Chemistries   Recent Labs Lab 07/23/16 0250 07/23/16 0913 07/24/16 0519  NA 137  --  136  K 4.1  --  4.6  CL 106  --  105  CO2 22  --  19*  GLUCOSE 331*  --  304*  BUN 29*  --  27*  CREATININE 1.66*  --  1.19*  CALCIUM 8.3*  --  8.6*  AST  --  18 27  ALT  --  13* 15  ALKPHOS  --  112 124  BILITOT  --  0.4 0.8   ------------------------------------------------------------------------------------------------------------------ estimated creatinine clearance is 65.3 mL/min (by C-G formula based on SCr of 1.19 mg/dL (H)). ------------------------------------------------------------------------------------------------------------------ No results for input(s): HGBA1C in the last 72 hours. ------------------------------------------------------------------------------------------------------------------  Recent Labs  07/23/16 1210  CHOL 265*  HDL 41  LDLCALC 169*  TRIG 277*  CHOLHDL 6.5    ------------------------------------------------------------------------------------------------------------------  Recent Labs  07/23/16 0913  TSH 0.382   ------------------------------------------------------------------------------------------------------------------ No results for input(s): VITAMINB12, FOLATE, FERRITIN, TIBC, IRON, RETICCTPCT in the last 72 hours.  Coagulation profile  Recent Labs Lab 07/23/16 0913  INR 1.57    No results for input(s): DDIMER in the last 72 hours.  Cardiac Enzymes  Recent Labs Lab 07/23/16 0519 07/23/16 0913 07/23/16 1403  TROPONINI 0.04* 0.04* 0.04*   ------------------------------------------------------------------------------------------------------------------ Invalid input(s): POCBNP   CBG:  Recent Labs Lab 07/23/16 1623 07/23/16 2006 07/23/16 2327 07/24/16 0408 07/24/16 0740  GLUCAP 209* 182* 273* 323* 281*       Studies: Dg Chest 2 View  Result Date: 07/23/2016 CLINICAL DATA:  55 y/o F; left-sided chest pain radiating down the left arm for 2 hours. Shortness of breath. EXAM: CHEST  2 VIEW COMPARISON:  05/17/2016 chest radiograph. FINDINGS: The heart size and mediastinal contours are within normal limits and stable. Both lungs are clear. The visualized skeletal structures  are unremarkable. IMPRESSION: No active cardiopulmonary disease. Electronically Signed   By: Kristine Garbe M.D.   On: 07/23/2016 03:16      Lab Results  Component Value Date   HGBA1C 9.9 (H) 05/19/2016   HGBA1C 10.3 (H) 01/14/2015   Lab Results  Component Value Date   LDLCALC 169 (H) 07/23/2016   CREATININE 1.19 (H) 07/24/2016       Scheduled Meds: . amLODipine  10 mg Oral Daily  . aspirin EC  81 mg Oral Daily  . atorvastatin  80 mg Oral q1800  . gabapentin  400 mg Oral TID  . [START ON 07/25/2016] Influenza vac split quadrivalent PF  0.5 mL Intramuscular Tomorrow-1000  . insulin aspart  0-9 Units Subcutaneous Q4H   . insulin glargine  15 Units Subcutaneous QHS  . metoprolol tartrate  25 mg Oral BID  . nitroGLYCERIN  1 inch Topical Q6H  . PARoxetine  40 mg Oral Q breakfast   Continuous Infusions: . sodium chloride 75 mL/hr at 07/23/16 0842  . heparin 1,350 Units/hr (07/24/16 0144)     LOS: 0 days    Time spent: >30 MINS    [facility]  [facility] Pager [phone]. If 7PM-7AM, please contact night-coverage at [email or web address], password [facility] 07/24/2016, 8:36 AM  LOS: 0 days

## 2016-07-24 NOTE — Progress Notes (Signed)
Inpatient Diabetes Program Recommendations  AACE/ADA: New Consensus Statement on Inpatient Glycemic Control (2015)  Target Ranges:  Prepandial:   less than 140 mg/dL      Peak postprandial:   less than 180 mg/dL (1-2 hours)      Critically ill patients:  140 - 180 mg/dL   Lab Results  Component Value Date   GLUCAP 281 (H) 07/24/2016   HGBA1C 9.9 (H) 05/19/2016    Review of Glycemic Control  Diabetes history: DM 2 Outpatient Diabetes medications: Lantus 15 units, Novolog 5 TID Current orders for Inpatient glycemic control: Lantus 15, Novolog Sensitive Q4hours  Inpatient Diabetes Program Recommendations:   A1c 9.9 in September. Uncontrolled. 300's glucose on admission. Fasting glucose 281 mg/dl after Lantus 15 units given last night. Consider increasing Lantus to 20 units during hospital stay.  Thanks,  Tama Headings RN, MSN, [facility] Inpatient Diabetes Coordinator Team Pager [phone] (8a-5p)

## 2016-07-24 NOTE — Care Management Note (Signed)
Case Management Note  Patient Details  Name: Tamara Henry MRN: [place]:[id number] Date of Birth: 07/27/1961  Subjective/Objective:   Pt presented fr unstable angina. Pt is from home with daughter. Pt is currently active with [facility] and has [facility] RN, PT, OT, ST. Pt will need resumption orders if the plan continues to be home once stable. Annamarie with [facility] is aware that pt is hospitalized.             Action/Plan: CM did speak with pt in regards to DME. Pt states she will benefit from a new RW with [facility]. CM will continue to monitor for additional needs.   Expected Discharge Date:                  Expected Discharge Plan:  [place]  In-House Referral:  NA  Discharge planning Services  CM Consult  Post Acute Care Choice:  Home Health, Resumption of Svcs/PTA Provider Choice offered to:  Patient  DME Arranged:    DME Agency:     HH Arranged: RN, PT, OT ST  HH Agency:  [facility]  Status of Service:  In process, will continue to follow  If discussed at Long Length of Stay Meetings, dates discussed:    Additional Comments:  Bethena Roys, RN 07/24/2016, 11:48 AM

## 2016-07-24 NOTE — Progress Notes (Signed)
ANTICOAGULATION CONSULT NOTE - Follow Up Consult  Pharmacy Consult for Heparin  Indication: chest pain/ACS, Hx PE (Xarelto on hold)  Patient Measurements: Height: 5\' 4"  (162.6 cm) Weight: 235 lb 14.4 oz (107 kg) IBW/kg (Calculated) : 54.7  Adjusted body weight: 80 kg  Vital Signs: Temp: 98.3 F (36.8 C) (11/13 1950) Temp Source: Axillary (11/13 1950) BP: 145/85 (11/13 1950) Pulse Rate: 91 (11/13 1950)  Labs:  Recent Labs  07/23/16 0250 07/23/16 0519 07/23/16 0913  07/23/16 1403 07/23/16 2302 07/24/16 0519 07/24/16 1033 07/24/16 1950  HGB 10.9*  --  10.4*  --   --   --  11.1*  --   --   HCT 33.1*  --  32.8*  --   --   --  33.5*  --   --   PLT 240  --  215  --   --   --  222  --   --   APTT  --   --   --   < > 61* 51*  --  62* 72*  LABPROT  --   --  18.9*  --   --   --   --   --   --   INR  --   --  1.57  --   --   --   --   --   --   HEPARINUNFRC  --   --   --   --  1.00*  --   --  0.64  --   CREATININE 1.66*  --   --   --   --   --  1.19*  --   --   TROPONINI  --  0.04* 0.04*  --  0.04*  --   --   --   --   < > = values in this interval not displayed.  Estimated Creatinine Clearance: 65.3 mL/min (by C-G formula based on SCr of 1.19 mg/dL (H)).   Assessment: 55 y/o female on Xarelto PTA for bilateral PE admitted with chest pain. Xarelto is being held and heparin drip started. Plan is for cath tomorrow. Last Xarelto 11/11 am per patient.  aPTT is therapeutic at 72 on 1550 units/hr. No bleeding noted.   Goal of Therapy:  Heparin level 0.3-0.7 units/ml aPTT 66-102 seconds      Plan:  Continue heparin drip at 1550 units/hr Daily heparin level and aPTT until correlating Daily CBC Monitor for s/sx of bleeding   Renold Genta, PharmD, BCPS Clinical Pharmacist Phone for tonight - [place] - [phone] 07/24/2016 9:24 PM

## 2016-07-24 NOTE — Progress Notes (Signed)
Pt reports she is very depressed and feeling not right emotionally, denies intent to harm self, but states she really doesn't feel right and is having a lot of stress right now, states she and her family are being evicted from their home today, pt lives with her daughter, her boyfriend and 6 grandchildren, states she has been trying to get help for depression and that she had an appointment today for an evaluation with [facility] which she will be missing, had recent fall with loss of several teeth required surgery after,  Still very swollen, husband died 2 years ago, reports children do not work and ask for money frequently, spiritual care consulted, social work consulted, MD notified of pt situation.  Edward Qualia RN

## 2016-07-24 NOTE — Progress Notes (Signed)
Patient Name: Katelee Gimeno Date of Encounter: 07/24/2016  Primary Cardiologist: new   Hospital Problem List     Principal Problem:   Unstable angina [facility]) Active Problems:   DM type 2, uncontrolled, with renal complications ([place])   Hypertension   Bilateral pulmonary embolism (HCC)   Hyperlipidemia   Diabetic peripheral neuropathy (HCC)   Chest pain     Subjective     Inpatient Medications    Scheduled Meds: . amLODipine  10 mg Oral Daily  . aspirin EC  81 mg Oral Daily  . atorvastatin  80 mg Oral q1800  . gabapentin  400 mg Oral TID  . [START ON 07/25/2016] Influenza vac split quadrivalent PF  0.5 mL Intramuscular Tomorrow-1000  . insulin aspart  0-9 Units Subcutaneous Q4H  . insulin glargine  10 Units Subcutaneous Once  . insulin glargine  15 Units Subcutaneous QHS  . metoprolol tartrate  25 mg Oral BID  . nitroGLYCERIN  1 inch Topical Q6H  . PARoxetine  40 mg Oral Q breakfast   Continuous Infusions: . sodium chloride 75 mL/hr at 07/23/16 0842  . heparin 1,350 Units/hr (07/24/16 0144)   PRN Meds: acetaminophen, diphenhydrAMINE, morphine injection, nitroGLYCERIN, ondansetron (ZOFRAN) IV   Vital Signs    Vitals:   07/23/16 2058 07/24/16 0008 07/24/16 0500 07/24/16 0754  BP: (!) 146/82 (!) 143/89 (!) 151/77 (!) 149/93  Pulse: 82 83 84 91  Resp: 16 17 17 16   Temp: 97.6 F (36.4 C) 97.5 F (36.4 C) 98.9 F (37.2 C) 97.8 F (36.6 C)  TempSrc: Oral Oral Oral Oral  SpO2: 96% 97% 95% 99%  Weight:   235 lb 14.4 oz (107 kg)   Height:        Intake/Output Summary (Last 24 hours) at 07/24/16 0928 Last data filed at 07/24/16 0905  Gross per 24 hour  Intake           2013.9 ml  Output             1550 ml  Net            463.9 ml   Filed Weights   07/23/16 0242 07/24/16 0500  Weight: 230 lb (104.3 kg) 235 lb 14.4 oz (107 kg)    Physical Exam    GEN: Well nourished, well developed, in no acute distress.  HEENT: Grossly normal.  Neck: Supple, no JVD,  carotid bruits, or masses. Cardiac: RRR, no murmurs, rubs, or gallops. No clubbing, cyanosis, edema.  Radials/DP/PT 2+ and equal bilaterally.  Respiratory:  Respirations regular and unlabored, clear to auscultation bilaterally. GI: Soft, nontender, nondistended, BS + x 4. MS: no deformity or atrophy. Skin: warm and dry, no rash. Neuro:  Strength and sensation are intact. Psych: AAOx3.  Normal affect.  Labs    CBC  Recent Labs  07/23/16 0913 07/24/16 0519  WBC 7.9 8.4  HGB 10.4* 11.1*  HCT 32.8* 33.5*  MCV 91.1 88.6  PLT 215 [id number]   Basic Metabolic Panel  Recent Labs  07/23/16 0250 07/24/16 0519  NA 137 136  K 4.1 4.6  CL 106 105  CO2 22 19*  GLUCOSE 331* 304*  BUN 29* 27*  CREATININE 1.66* 1.19*  CALCIUM 8.3* 8.6*   Liver Function Tests  Recent Labs  07/23/16 0913 07/24/16 0519  AST 18 27  ALT 13* 15  ALKPHOS 112 124  BILITOT 0.4 0.8  PROT 6.4* 6.3*  ALBUMIN 2.6* 2.7*    Recent Labs  07/23/16 0913  LIPASE 28   Cardiac Enzymes  Recent Labs  07/23/16 0519 07/23/16 0913 07/23/16 1403  TROPONINI 0.04* 0.04* 0.04*   Fasting Lipid Panel  Recent Labs  07/23/16 1210  CHOL 265*  HDL 41  LDLCALC 169*  TRIG 277*  CHOLHDL 6.5   Thyroid Function Tests  Recent Labs  07/23/16 0913  TSH 0.382    Telemetry     NSR- Personally Reviewed  ECG    NSR with inferior ST depression - Personally Reviewed  Radiology    Dg Chest 2 View  Result Date: 07/23/2016 CLINICAL DATA:  55 y/o F; left-sided chest pain radiating down the left arm for 2 hours. Shortness of breath. EXAM: CHEST  2 VIEW COMPARISON:  05/17/2016 chest radiograph. FINDINGS: The heart size and mediastinal contours are within normal limits and stable. Both lungs are clear. The visualized skeletal structures are unremarkable. IMPRESSION: No active cardiopulmonary disease. Electronically Signed   By: Kristine Garbe M.D.   On: 07/23/2016 03:16    Patient Profile     Ms.  Delfino is a 55 year old female with a past medical history of obesity, DM, HTN, HLD, coronary calcifications on CT, GERD and recently diagnosed bilateral PE's. She presented to the ED on 07/23/16 with chest pain, troponin 0.04 x 3.   Assessment & Plan    1. Chest pain concerning for unstable angina: Patient had chest pain that she describes as a squeezing, toothache like feeling in her chest with radiation to her left arm. Denies associated SOB and diaphoresis.   She was recently diagnosed with bilateral PE's but says that this pain is different that when she had her PE's. She is on Xarelto outpatient, last dose was 07/23/16, on heparin gtt now.   She has risk factors for CAD, will need stress test vs. Cath. MD to advise.   2. HTN: Remains hypertensive, her amlodipine was increased to 10mg  yesterday, HCTZ was increased to 25mg  yesterday, and on metoprolol which we can titrate up today as well.   3. HLD: Started on atorvastatin 80mg  based on AHA/ACC guidelines. Continue ezetimibe.   4. Poorly controlled IDDM: last A1c was 9.9.   5. History of bilateral PE: consider repeat CTPA to evaluate clot progression if cardiac work up is unrevealing.    Signed, Arbutus Leas, NP  07/24/2016, 9:28 AM

## 2016-07-24 NOTE — Care Management Obs Status (Signed)
[place] NOTIFICATION   Patient Details  Name: Tamara Henry MRN: [place]:[id number] Date of Birth: Jun 11, 1961   Medicare Observation Status Notification Given:  Yes    Bethena Roys, RN 07/24/2016, 11:43 AM

## 2016-07-25 ENCOUNTER — Encounter (HOSPITAL_COMMUNITY): Payer: Self-pay | Admitting: Cardiology

## 2016-07-25 ENCOUNTER — Inpatient Hospital Stay (HOSPITAL_COMMUNITY): Payer: Medicare Other

## 2016-07-25 ENCOUNTER — Encounter (HOSPITAL_COMMUNITY)
Admission: EM | Disposition: A | Discharge status: Home Health | Payer: Self-pay | Source: Home / Self Care | Attending: Cardiothoracic Surgery

## 2016-07-25 ENCOUNTER — Other Ambulatory Visit: Payer: Self-pay | Admitting: *Deleted

## 2016-07-25 DIAGNOSIS — I25110 Atherosclerotic heart disease of native coronary artery with unstable angina pectoris: Secondary | ICD-10-CM

## 2016-07-25 DIAGNOSIS — I2510 Atherosclerotic heart disease of native coronary artery without angina pectoris: Secondary | ICD-10-CM

## 2016-07-25 DIAGNOSIS — I10 Essential (primary) hypertension: Secondary | ICD-10-CM

## 2016-07-25 DIAGNOSIS — E7801 Familial hypercholesterolemia: Secondary | ICD-10-CM

## 2016-07-25 DIAGNOSIS — R079 Chest pain, unspecified: Secondary | ICD-10-CM

## 2016-07-25 HISTORY — PX: CARDIAC CATHETERIZATION: SHX172

## 2016-07-25 LAB — GLUCOSE, CAPILLARY
GLUCOSE-CAPILLARY: 147 mg/dL — AB (ref 65–99)
GLUCOSE-CAPILLARY: 164 mg/dL — AB (ref 65–99)
GLUCOSE-CAPILLARY: 129 mg/dL — AB (ref 65–99)
Glucose-Capillary: 142 mg/dL — ABNORMAL HIGH (ref 65–99)
Glucose-Capillary: 176 mg/dL — ABNORMAL HIGH (ref 65–99)
Glucose-Capillary: 247 mg/dL — ABNORMAL HIGH (ref 65–99)

## 2016-07-25 LAB — ECHOCARDIOGRAM COMPLETE
HEIGHTINCHES: 64 in
WEIGHTICAEL: 3761.6 [oz_av]

## 2016-07-25 LAB — COMPREHENSIVE METABOLIC PANEL
ALBUMIN: 2.5 g/dL — AB (ref 3.5–5.0)
ALT: 14 U/L (ref 14–54)
AST: 32 U/L (ref 15–41)
Alkaline Phosphatase: 93 U/L (ref 38–126)
Anion gap: 8 (ref 5–15)
BUN: 23 mg/dL — AB (ref 6–20)
CHLORIDE: 109 mmol/L (ref 101–111)
CO2: 21 mmol/L — AB (ref 22–32)
CREATININE: 1.07 mg/dL — AB (ref 0.44–1.00)
Calcium: 8.4 mg/dL — ABNORMAL LOW (ref 8.9–10.3)
GFR calc Af Amer: >60 mL/min (ref >60)
GFR, EST NON AFRICAN AMERICAN: 58 mL/min — AB (ref >60)
GLUCOSE: 145 mg/dL — AB (ref 65–99)
POTASSIUM: 5.2 mmol/L — AB (ref 3.5–5.1)
SODIUM: 138 mmol/L (ref 135–145)
Total Bilirubin: 1.4 mg/dL — ABNORMAL HIGH (ref 0.3–1.2)
Total Protein: 6 g/dL — ABNORMAL LOW (ref 6.5–8.1)

## 2016-07-25 LAB — CBC
HCT: 31.7 % — ABNORMAL LOW (ref 36.0–46.0)
Hemoglobin: 10.4 g/dL — ABNORMAL LOW (ref 12.0–15.0)
MCH: 29.2 pg (ref 26.0–34.0)
MCHC: 32.8 g/dL (ref 30.0–36.0)
MCV: 89 fL (ref 78.0–100.0)
PLATELETS: 235 10*3/uL (ref 150–400)
RBC: 3.56 MIL/uL — AB (ref 3.87–5.11)
RDW: 13.9 % (ref 11.5–15.5)
WBC: 10.4 10*3/uL (ref 4.0–10.5)

## 2016-07-25 LAB — BLOOD GAS, ARTERIAL
Acid-Base Excess: 1.4 mmol/L (ref 0.0–2.0)
Bicarbonate: 26 mmol/L (ref 20.0–28.0)
Drawn by: 27407
FIO2: 21
O2 Saturation: 86 %
Patient temperature: 98.6
pCO2 arterial: 45.3 mmHg (ref 32.0–48.0)
pH, Arterial: 7.377 (ref 7.350–7.450)
pO2, Arterial: 54.1 mmHg — ABNORMAL LOW (ref 83.0–108.0)

## 2016-07-25 LAB — HEPARIN LEVEL (UNFRACTIONATED): Heparin Unfractionated: 0.72 IU/mL — ABNORMAL HIGH (ref 0.30–0.70)

## 2016-07-25 SURGERY — LEFT HEART CATH AND CORONARY ANGIOGRAPHY

## 2016-07-25 MED ORDER — HEPARIN SODIUM (PORCINE) 1000 UNIT/ML IJ SOLN
INTRAMUSCULAR | Status: DC | PRN
  Administered 2016-07-25: 5000 [IU] via INTRAVENOUS

## 2016-07-25 MED ORDER — FENTANYL CITRATE (PF) 100 MCG/2ML IJ SOLN
INTRAMUSCULAR | Status: DC | PRN
  Administered 2016-07-25: 50 ug via INTRAVENOUS

## 2016-07-25 MED ORDER — HEPARIN (PORCINE) IN NACL 100-0.45 UNIT/ML-% IJ SOLN
1350.0000 [IU]/h | INTRAMUSCULAR | Status: DC
  Administered 2016-07-25 – 2016-07-26 (×3): 1500 [IU]/h via INTRAVENOUS
  Administered 2016-07-27: 1350 [IU]/h via INTRAVENOUS
  Filled 2016-07-25 (×4): qty 250

## 2016-07-25 MED ORDER — LIDOCAINE HCL (PF) 1 % IJ SOLN
INTRAMUSCULAR | Status: DC | PRN
  Administered 2016-07-25: 2 mL via SUBCUTANEOUS

## 2016-07-25 MED ORDER — SODIUM CHLORIDE 0.9% FLUSH
3.0000 mL | Freq: Two times a day (BID) | INTRAVENOUS | Status: DC
  Administered 2016-07-25 – 2016-07-27 (×3): 3 mL via INTRAVENOUS

## 2016-07-25 MED ORDER — HEPARIN SODIUM (PORCINE) 1000 UNIT/ML IJ SOLN
INTRAMUSCULAR | Status: AC
  Filled 2016-07-25: qty 1

## 2016-07-25 MED ORDER — MIDAZOLAM HCL 2 MG/2ML IJ SOLN
INTRAMUSCULAR | Status: AC
  Filled 2016-07-25: qty 2

## 2016-07-25 MED ORDER — VERAPAMIL HCL 2.5 MG/ML IV SOLN
INTRAVENOUS | Status: AC
  Filled 2016-07-25: qty 2

## 2016-07-25 MED ORDER — IOPAMIDOL (ISOVUE-370) INJECTION 76%
INTRAVENOUS | Status: DC | PRN
  Administered 2016-07-25: 110 mL via INTRA_ARTERIAL

## 2016-07-25 MED ORDER — LIDOCAINE HCL (PF) 1 % IJ SOLN
INTRAMUSCULAR | Status: AC
  Filled 2016-07-25: qty 30

## 2016-07-25 MED ORDER — SODIUM CHLORIDE 0.9% FLUSH: 3.0000 mL | INTRAVENOUS | Status: DC | PRN

## 2016-07-25 MED ORDER — HEPARIN (PORCINE) IN NACL 2-0.9 UNIT/ML-% IJ SOLN
INTRAMUSCULAR | Status: DC | PRN
  Administered 2016-07-25: 1000 mL

## 2016-07-25 MED ORDER — FENTANYL CITRATE (PF) 100 MCG/2ML IJ SOLN
INTRAMUSCULAR | Status: AC
  Filled 2016-07-25: qty 2

## 2016-07-25 MED ORDER — IOPAMIDOL (ISOVUE-370) INJECTION 76%
INTRAVENOUS | Status: AC
  Filled 2016-07-25: qty 100

## 2016-07-25 MED ORDER — METOPROLOL TARTRATE 50 MG PO TABS
50.0000 mg | ORAL_TABLET | Freq: Two times a day (BID) | ORAL | Status: DC
  Administered 2016-07-25 – 2016-07-27 (×5): 50 mg via ORAL
  Filled 2016-07-25 (×5): qty 1

## 2016-07-25 MED ORDER — ZOLPIDEM TARTRATE 5 MG PO TABS
5.0000 mg | ORAL_TABLET | Freq: Every evening | ORAL | Status: DC | PRN
  Administered 2016-07-25 – 2016-07-26 (×2): 5 mg via ORAL
  Filled 2016-07-25 (×2): qty 1

## 2016-07-25 MED ORDER — HEPARIN (PORCINE) IN NACL 2-0.9 UNIT/ML-% IJ SOLN
INTRAMUSCULAR | Status: AC
  Filled 2016-07-25: qty 1000

## 2016-07-25 MED ORDER — MIDAZOLAM HCL 2 MG/2ML IJ SOLN
INTRAMUSCULAR | Status: DC | PRN
  Administered 2016-07-25: 1 mg via INTRAVENOUS

## 2016-07-25 MED ORDER — VERAPAMIL HCL 2.5 MG/ML IV SOLN
INTRAVENOUS | Status: DC | PRN
  Administered 2016-07-25: 10 mL via INTRA_ARTERIAL

## 2016-07-25 MED ORDER — SODIUM CHLORIDE 0.9 % IV SOLN: 250.0000 mL | INTRAVENOUS | Status: DC | PRN

## 2016-07-25 MED ORDER — SODIUM CHLORIDE 0.9 % WEIGHT BASED INFUSION: 1.0000 mL/kg/h | INTRAVENOUS | Status: AC

## 2016-07-25 SURGICAL SUPPLY — 12 items
CATH 5FR JL3.5 JR4 ANG PIG MP (CATHETERS) ×3 IMPLANT
CATH INFINITI 5 FR 3DRC (CATHETERS) ×3 IMPLANT
CATH INFINITI 5 FR AR1 MOD (CATHETERS) ×3 IMPLANT
DEVICE RAD COMP TR BAND LRG (VASCULAR PRODUCTS) ×3 IMPLANT
GLIDESHEATH SLEND SS 6F .021 (SHEATH) ×3 IMPLANT
GUIDEWIRE INQWIRE 1.5J.035X260 (WIRE) ×1 IMPLANT
INQWIRE 1.5J .035X260CM (WIRE) ×3
KIT HEART LEFT (KITS) ×3 IMPLANT
PACK CARDIAC CATHETERIZATION (CUSTOM PROCEDURE TRAY) ×3 IMPLANT
SYR MEDRAD MARK V 150ML (SYRINGE) ×3 IMPLANT
TRANSDUCER W/STOPCOCK (MISCELLANEOUS) ×3 IMPLANT
TUBING CIL FLEX 10 FLL-RA (TUBING) ×3 IMPLANT

## 2016-07-25 NOTE — Progress Notes (Signed)
   07/25/16 1005  Clinical Encounter Type  Visited With Patient  Visit Type Initial;Spiritual support  Referral From Chaplain  Consult/Referral To Chaplain  Chaplain has attempted to see patient two days in a row, patient was gone out for testings, will follow-up again.  [employer] [phone]

## 2016-07-25 NOTE — Progress Notes (Signed)
[facility] PROGRESS NOTE  Tamara Henry [id number] DOB: 21-Jan-1961 DOA: 07/23/2016   PCP: Philis Fendt, MD     Assessment/Plan: Principal Problem:   Unstable angina ([place]) Active Problems:   DM type 2, uncontrolled, with renal complications ([place])   Hypertension   Bilateral pulmonary embolism (HCC)   Hyperlipidemia   Diabetic peripheral neuropathy (HCC)   Chest pain   55 y.o.femalewith a PMH significant for morbid obesity, IDDM, HTN, dyslipidemia, coronary calcifications on CT, GERD, and recently diagnosed bilateral PEs who  presented to the ED on 07/23/16 with chest pain, troponin 0.04 x 3.. The pt has been in a limited stable of health over the past 2 months following her diagnosis of PE, notably ambulating with the assistance of a walker and requiring homecare nursing. As per cardiology chest pain consistent with unstable angina. Currently on a heparin drip, cardiac cath 11/14.  Assessment and plan Unstable angina/chest pain Continue heparin drip  (last dose of Xarelto 11/11 morning around 10AM).    --Cardiology consulted,recent CT chest demonstrates coronary calcium in all 3 epicardial vessels.   Continue aspirin, statin, metoprolol, Awaiting further recommendations from cardiology,for cath today Severe 3 vessel obstructive CAD, CT surgery consult for CABG --IV morphine prn for chest pain refractory to nitroglycerin 2-D echo pending    Dyslipidemia-continue statin  Insulin-dependent Type 2 DM --Continue home dose of levemir, hemoglobin A1c 9.9 on 05/19/16 Continue to adjust insulin regimen  Diabetic neuropathy --Continue home dose of neurotin  CKD 3, I think Creatinine around 1.5-1.6 , improved after IV fluids --HOLD HCTZ  continue IV fluids if the patient needs cardiac cath --Monitor renal function   History of depression --Continue Paxil, patient is requesting psyche consult , for stressors in her life . This has been  requested  HTN -increase metoprolol to 50mg  BID and continue amlodipine  Bilateral subsegmental PE --Resume Xarelto after cardiac cath  .   currently on IV heparin due to concerns for ACS at this time. --Low index of suspicion for recurrent PE at this time.     DVT prophylaxsis heparin drip  Code Status:  Full code      Family Communication: Discussed in detail with the patient, all imaging results, lab results explained to the patient   Disposition Plan: Disposition as per cardiology, CABG?      Consultants:  Cardiology    Procedures:  Cardiac cath 11/14  Antibiotics: Anti-infectives    Start     Dose/Rate Route Frequency Ordered Stop   07/24/16 1515  fluconazole (DIFLUCAN) tablet 100 mg     100 mg Oral  Once 07/24/16 1508 07/24/16 1744         HPI/Subjective: Chest pain free currently,post cath  Objective: Vitals:   07/25/16 0041 07/25/16 0047 07/25/16 0439 07/25/16 0813  BP: (!) 146/79  (!) 147/84 (!) 146/85  Pulse: 69  69   Resp:  16 17   Temp: 97.7 F (36.5 C)  97.9 F (36.6 C) 97.5 F (36.4 C)  TempSrc: Oral  Axillary Oral  SpO2:  95% 97% 96%  Weight:   106.6 kg (235 lb 1.6 oz)   Height:        Intake/Output Summary (Last 24 hours) at 07/25/16 0834 Last data filed at 07/25/16 0700  Gross per 24 hour  Intake          3133.75 ml  Output             3100 ml  Net            33.75 ml    Exam:  Examination:  General exam: Appears calm and comfortable  Respiratory system: Clear to auscultation. Respiratory effort normal. Cardiovascular system: S1 & S2 heard, RRR. No JVD, murmurs, rubs, gallops or clicks. No pedal edema. Gastrointestinal system: Abdomen is nondistended, soft and nontender. No organomegaly or masses felt. Normal bowel sounds heard. Central nervous system: Alert and oriented. No focal neurological deficits. Extremities: Symmetric 5 x 5 power. Skin: No rashes, lesions or ulcers Psychiatry: Judgement and insight appear  normal. Mood & affect appropriate.     Data Reviewed: I have personally reviewed following labs and imaging studies  Micro Results Recent Results (from the past 240 hour(s))  MRSA PCR Screening     Status: None   Collection Time: 07/23/16  7:22 AM  Result Value Ref Range Status   MRSA by PCR NEGATIVE NEGATIVE Final    Comment:        The GeneXpert MRSA Assay (FDA approved for NASAL specimens only), is one component of a comprehensive MRSA colonization surveillance program. It is not intended to diagnose MRSA infection nor to guide or monitor treatment for MRSA infections.     Radiology Reports Dg Chest 2 View  Result Date: 07/23/2016 CLINICAL DATA:  55 y/o F; left-sided chest pain radiating down the left arm for 2 hours. Shortness of breath. EXAM: CHEST  2 VIEW COMPARISON:  05/17/2016 chest radiograph. FINDINGS: The heart size and mediastinal contours are within normal limits and stable. Both lungs are clear. The visualized skeletal structures are unremarkable. IMPRESSION: No active cardiopulmonary disease. Electronically Signed   By: Kristine Garbe M.D.   On: 07/23/2016 03:16     CBC  Recent Labs Lab 07/23/16 0250 07/23/16 0913 07/24/16 0519 07/25/16 0329  WBC 9.1 7.9 8.4 10.4  HGB 10.9* 10.4* 11.1* 10.4*  HCT 33.1* 32.8* 33.5* 31.7*  PLT 240 215 222 235  MCV 89.2 91.1 88.6 89.0  MCH 29.4 28.9 29.4 29.2  MCHC 32.9 31.7 33.1 32.8  RDW 13.4 13.3 13.9 13.9    Chemistries   Recent Labs Lab 07/23/16 0250 07/23/16 0913 07/24/16 0519 07/25/16 0329  NA 137  --  136 138  K 4.1  --  4.6 5.2*  CL 106  --  105 109  CO2 22  --  19* 21*  GLUCOSE 331*  --  304* 145*  BUN 29*  --  27* 23*  CREATININE 1.66*  --  1.19* 1.07*  CALCIUM 8.3*  --  8.6* 8.4*  AST  --  18 27 32  ALT  --  13* 15 14  ALKPHOS  --  112 124 93  BILITOT  --  0.4 0.8 1.4*    ------------------------------------------------------------------------------------------------------------------ estimated creatinine clearance is 72.5 mL/min (by C-G formula based on SCr of 1.07 mg/dL (H)). ------------------------------------------------------------------------------------------------------------------ No results for input(s): HGBA1C in the last 72 hours. ------------------------------------------------------------------------------------------------------------------  Recent Labs  07/23/16 1210  CHOL 265*  HDL 41  LDLCALC 169*  TRIG 277*  CHOLHDL 6.5   ------------------------------------------------------------------------------------------------------------------  Recent Labs  07/23/16 0913  TSH 0.382   ------------------------------------------------------------------------------------------------------------------ No results for input(s): VITAMINB12, FOLATE, FERRITIN, TIBC, IRON, RETICCTPCT in the last 72 hours.  Coagulation profile  Recent Labs Lab 07/23/16 0913  INR 1.57    No results for input(s): DDIMER in the last 72 hours.  Cardiac Enzymes  Recent Labs Lab 07/23/16 0519 07/23/16 0913 07/23/16 1403  TROPONINI 0.04* 0.04* 0.04*   ------------------------------------------------------------------------------------------------------------------  Invalid input(s): POCBNP   CBG:  Recent Labs Lab 07/24/16 1948 07/24/16 2213 07/25/16 0045 07/25/16 0439 07/25/16 0746  GLUCAP 326* 250* 176* 147* 164*       Studies: No results found.    Lab Results  Component Value Date   HGBA1C 9.9 (H) 05/19/2016   HGBA1C 10.3 (H) 01/14/2015   Lab Results  Component Value Date   LDLCALC 169 (H) 07/23/2016   CREATININE 1.07 (H) 07/25/2016       Scheduled Meds: . amLODipine  10 mg Oral Daily  . aspirin EC  81 mg Oral Daily  . atorvastatin  80 mg Oral q1800  . gabapentin  400 mg Oral TID  . Influenza vac split quadrivalent PF   0.5 mL Intramuscular Tomorrow-1000  . insulin aspart  0-9 Units Subcutaneous Q4H  . insulin glargine  15 Units Subcutaneous QHS  . metoprolol tartrate  50 mg Oral BID  . nitroGLYCERIN  1 inch Topical Q6H  . PARoxetine  40 mg Oral Q breakfast   Continuous Infusions: . sodium chloride Stopped (07/25/16 0535)  . sodium chloride 125 mL/hr at 07/25/16 0535  . heparin 1,550 Units/hr (07/24/16 2035)     LOS: 1 day    Time spent: >30 MINS    [facility]  [facility] Pager [phone]. If 7PM-7AM, please contact night-coverage at [email or web address], password [email or web address] 07/25/2016, 8:34 AM  LOS: 1 day

## 2016-07-25 NOTE — Consult Note (Signed)
[address]       [place] [address]             [phone]        Tamara Henry [place] Medical Record [id number] Date of Birth: Aug 07, 1961  Referring: Geanie Cooley Primary Care: Philis Fendt, MD  Chief Complaint:    Chief Complaint  Patient presents with  . Chest Pain  `Patient examined, cardiac catheterization and recent 2-D echocardiogram personally reviewed and counseled with patient  History of Present Illness:     55 year old morbidly obese diabetic AA female admitted to the hospital this past weekend with unstable angina. Cardiac enzymes were  minimally elevated. The patient had poorly controlled diabetes and hypertension and was known to have significant coronary calcification on recent CTA of thorax when she was hospitalized and treated for severe acute pulmonary emboli September 2017. The patient recently had oral and dental surgery and felt chest pain shortly after the procedure. The patient has been on  Xarelto since she was diagnosed with bilateral pulmonary emboli September 2017. She did not complain of shortness of breath hemoptysis or low oxygen saturation which presented with chest pain. A repeat CTA was not performed.  The patient has been on IV heparin since current admission and last dose of Xarelto was November 11.   Echocardiogram at the time of pulmonary embolus diagnosis showed normal biventricular function no valvular abnormalities, no right heart strain. Repeat echo is pending showing her presentation with non-STEMI.  Cardiac catheterization today by Dr. Martinique demonstrates severe multivessel CAD and a diabetic pattern with LVEDP 27 mmHg and EF 55%. Coronary anatomy is not consistent with PCI and cardiothoracic surgical evaluation for CABG has been recommended.. The catheterization was done via right radial artery. No unexpected bleeding from the arterial puncture. The patient is right-hand dominant.  The patient denies any  history of DVT or venous disease of the lower extremities. Repeat duplex scan of her lower extremity veins is pending. Current Activity/ Functional Status: Patient uses a rolling walker at home She is disabled because of her recent severe bilateral pulmonary emboli. She had a syncopal event and a fall in her bathroom at the time she was diagnosed with bilateral pulmonary emboli. She sustained facial and dental injury from the fall and had recent restorative oral and dental surgery.   Zubrod Score: At the time of surgery this patient's most appropriate activity status/level should be described as: []     0    Normal activity, no symptoms []     1    Restricted in physical strenuous activity but ambulatory, able to do out light work []     2    Ambulatory and capable of self care, unable to do work activities, up and about                 more than 50%  Of the time                            [x]     3    Only limited self care, in bed greater than 50% of waking hours []     4    Completely disabled, no self care, confined to bed or chair []     5    Moribund  Past Medical History:  Diagnosis Date  . Acid reflux   . Diabetes mellitus without complication ([place])   . High cholesterol   .  Hx of chest tube placement right   . Hypertension   . Neuropathy ([place])   . Pneumonia     Past Surgical History:  Procedure Laterality Date  . CARDIAC CATHETERIZATION N/A 07/25/2016   Procedure: Left Heart Cath and Coronary Angiography;  Surgeon: Peter M Martinique, MD;  Location: [facility];  Service: Cardiovascular;  Laterality: N/A;  . TUBAL LIGATION      History  Smoking Status  . Former Smoker  . Quit date: 09/11/2005  Smokeless Tobacco  . Never Used    History  Alcohol Use No    Social History   Social History  . Marital status: Married    Spouse name: N/A  . Number of children: 4  . Years of education: 12   Occupational History  . disabled    Social History Main Topics  . Smoking  status: Former Smoker    Quit date: 09/11/2005  . Smokeless tobacco: Never Used  . Alcohol use No  . Drug use: No  . Sexual activity: Yes    Birth control/ protection: Surgical   Other Topics Concern  . Not on file   Social History Narrative   Lives at home with daughter Roena Malady no caffeine     Allergies  Allergen Reactions  . Lactose Intolerance (Gi) Diarrhea  . Lisinopril Other (See Comments) and Cough    Inflammation, coughing  . Metformin And Related Diarrhea  . Reglan [Metoclopramide] Other (See Comments)    Pt stated having lock jaw as a side effect  . Sulfa Antibiotics Itching  . Wellbutrin [Bupropion] Nausea And Vomiting and Cough  . Latex Rash  . Penicillins Rash    ALL -CILINS Has patient had a PCN reaction causing immediate rash, facial/tongue/throat swelling, SOB or lightheadedness with hypotension: Yes Has patient had a PCN reaction causing severe rash involving mucus membranes or skin necrosis: No Has patient had a PCN reaction that required hospitalization No Has patient had a PCN reaction occurring within the last 10 years: Yes If all of the above answers are "NO", then may proceed with Cephalosporin use.   . Tape Rash    Current Facility-Administered Medications  Medication Dose Route Frequency Provider Last Rate Last Dose  . 0.9 %  sodium chloride infusion  250 mL Intravenous PRN Peter M Martinique, MD      . 0.9% sodium chloride infusion  1 mL/kg/hr Intravenous Continuous Peter M Martinique, MD      . acetaminophen (TYLENOL) tablet 650 mg  650 mg Oral Q4H PRN Lily Kocher, MD      . amLODipine (NORVASC) tablet 10 mg  10 mg Oral Daily Lily Kocher, MD   10 mg at 07/25/16 1341  . aspirin EC tablet 81 mg  81 mg Oral Daily Lily Kocher, MD   81 mg at 07/24/16 0812  . atorvastatin (LIPITOR) tablet 80 mg  80 mg Oral q1800 Lily Kocher, MD   80 mg at 07/25/16 1341  . diphenhydrAMINE (BENADRYL) capsule 25 mg  25 mg Oral Q4H PRN Jeryl Columbia, NP   25 mg at  07/23/16 2347  . gabapentin (NEURONTIN) capsule 400 mg  400 mg Oral TID Lily Kocher, MD   400 mg at 07/25/16 1341  . heparin ADULT infusion 100 units/mL (25000 units/230mL sodium chloride 0.45%)  1,500 Units/hr Intravenous Continuous Peter M Martinique, MD      . Influenza vac split quadrivalent PF (FLUARIX) injection 0.5 mL  0.5 mL Intramuscular Tomorrow-1000 Nayana  Abrol, MD      . insulin aspart (novoLOG) injection 0-9 Units  0-9 Units Subcutaneous Q4H Lily Kocher, MD   1 Units at 07/25/16 0443  . insulin glargine (LANTUS) injection 15 Units  15 Units Subcutaneous QHS Reyne Dumas, MD   15 Units at 07/24/16 2214  . metoprolol (LOPRESSOR) tablet 50 mg  50 mg Oral BID Arbutus Leas, NP      . morphine 2 MG/ML injection 2 mg  2 mg Intravenous Q4H PRN Lily Kocher, MD   2 mg at 07/24/16 0827  . nitroGLYCERIN (NITROGLYN) 2 % ointment 1 inch  1 inch Topical Q6H Lily Kocher, MD   1 inch at 07/25/16 1342  . nitroGLYCERIN (NITROSTAT) SL tablet 0.4 mg  0.4 mg Sublingual Q5 Min x 3 PRN Lily Kocher, MD      . ondansetron [facility]) injection 4 mg  4 mg Intravenous Q6H PRN Lily Kocher, MD      . oxyCODONE-acetaminophen (PERCOCET/ROXICET) 5-325 MG per tablet 1 tablet  1 tablet Oral Q4H PRN Reyne Dumas, MD   1 tablet at 07/25/16 1443  . PARoxetine (PAXIL) tablet 40 mg  40 mg Oral Q breakfast Lily Kocher, MD   40 mg at 07/25/16 1341  . sodium chloride flush (NS) 0.9 % injection 3 mL  3 mL Intravenous Q12H Peter M Martinique, MD      . sodium chloride flush (NS) 0.9 % injection 3 mL  3 mL Intravenous PRN Peter M Martinique, MD          Family History  Problem Relation Age of Onset  . Pancreatitis Mother   . Diabetes type II Mother   . CAD Father   . Diabetes type II Father   . Diabetes type II Sister   . Diabetes type II Brother      Review of Systems:       Cardiac Review of Systems: Y or N  Chest Pain [  Yes  ]  Resting SOB [   ] Exertional SOB  [ yes ]  Orthopnea [  ]   Pedal Edema [   ]      Palpitations [place]  ] Syncope  [ yes ]   Presyncope [   ]  General Review of Systems: [Y] = yes [  ]=no Constitional: recent weight change [ increase in weight ]; anorexia [  ]; fatigue [  ]; nausea [  ]; night sweats [  ]; fever [  ]; or chills [  ]                                                               Dental: poor dentition[  ]; Last Dentist visit: Recent dental surgery November 2017  Eye : blurred vision [  ]; diplopia [   ]; vision changes [  ];  Amaurosis fugax[  ]; Resp: cough [  ];  wheezing[  ];  hemoptysis[  ]; shortness of breath[ yes  ]; paroxysmal nocturnal dyspnea[  ]; dyspnea on exertion[  ]; or orthopnea[  ]; patient with history of right pleural effusion drained with chest tube at a [facility] 5 years ago-no apparent VATS GI:  gallstones[  ], vomiting[  ];  dysphagia[  ]; melena[  ];  hematochezia [  ]; heartburn[  ];   Hx of  Colonoscopy[  ]; GU: kidney stones [  ]; hemat yes uria[  ];   dysuria [  ];  nocturia[  ];  history of     obstruction [  ]; urinary frequency [  ]             Skin: rash, swelling[  ];, hair loss[  ];  peripheral edema[  ];  or itching[  ]; Musculosketetal: myalgias[  ];  joint swelling[  ];  joint erythema[  ];  joint pain[  ];  back pain[  ];  Heme/Lymph: bruising[  ];  bleeding[  ];  anemia[  ];  Neuro: TIA[  ];  headaches[  ];  stroke[  ];  vertigo[  ];  seizures[  ];   paresthesias[  ];  difficulty walking[  ];  Psych:depression[  ]; anxiety[  ]; neuropathy of feet  Endocrine: diabetes[ yes  ];  thyroid dysfunction[  ];  Immunizations: Flu [  ]; Pneumococcal[  ];  Other: Right-hand dominant  Physical Exam: BP 140/66 (BP Location: Left Arm)   Pulse 74   Temp 98.2 F (36.8 C) (Oral)   Resp 16   Ht 5\' 4"  (1.626 m)   Wt 235 lb 1.6 oz (106.6 kg)   SpO2 100%   BMI 40.35 kg/m        Physical Exam  General: Obese middle-aged AA female laying supine in bed in no acute distress  HEENT: Normocephalic pupils equal , dentition  adequate-Recent dental extraction  Neck: Supple without JVD, adenopathy, or bruit Chest: Clear to auscultation, symmetrical breath sounds, no rhonchi, no tenderness             or deformity Cardiovascular: Regular rate and rhythm, no murmur, no gallop, peripheral pulses             palpable in all extremities Abdomen:  Soft, nontender, no palpable mass or organomegaly Extremities: Warm, well-perfused, no clubbing cyanosis edema or tenderness,              no venous stasis changes of the legs Rectal/GU: Deferred Neuro: Grossly non--focal and symmetrical throughout Skin: Clean and dry without rash or ulceration   Diagnostic Studies & Laboratory data:     Recent Radiology Findings:   No results found.   I have independently reviewed the above radiologic studies.  Recent Lab Findings: Lab Results  Component Value Date   WBC 10.4 07/25/2016   HGB 10.4 (L) 07/25/2016   HCT 31.7 (L) 07/25/2016   PLT 235 07/25/2016   GLUCOSE 145 (H) 07/25/2016   CHOL 265 (H) 07/23/2016   TRIG 277 (H) 07/23/2016   HDL 41 07/23/2016   LDLCALC 169 (H) 07/23/2016   ALT 14 07/25/2016   AST 32 07/25/2016   NA 138 07/25/2016   K 5.2 (H) 07/25/2016   CL 109 07/25/2016   CREATININE 1.07 (H) 07/25/2016   BUN 23 (H) 07/25/2016   CO2 21 (L) 07/25/2016   TSH 0.382 07/23/2016   INR 1.57 07/23/2016   HGBA1C 9.9 (H) 05/19/2016      Assessment / Plan:      Unstable angina with non-ST elevation MI Severe multivessel CAD and a diabetic pattern not conducive to PCI Preserved LV systolic function Recent bilateral severe pulmonary emboli treated with oral Xarelto since hospitalization September 2017 Poorly controlled diabetes mellitus-hemoglobin A1c 10.0 Morbid obesity History of right pleural effusion treated with chest tube Hypertension GERD  Although at increased risk because recent pulmonary embolus and morbid obesity the patient would benefit from multivessel CABG with bypass grafts planned to the  LAD, diagonal, OM, and RCA. I discussed the procedure in detail the patient and her family including expected benefits, the alternatives, and the potential risks as well as the expected postoperative recovery. She understands that the operation is at increased risk because of her comorbid medical problems. We'll proceed with preoperative workup and Xarelto washout    07/25/2016 2:48 PM

## 2016-07-25 NOTE — Progress Notes (Signed)
Patient Name: Tamara Henry Date of Encounter: 07/25/2016  Primary Cardiologist: New, Dr. Sundra Aland Problem List     Principal Problem:   Unstable angina [facility]) Active Problems:   DM type 2, uncontrolled, with renal complications ([place])   Hypertension   Bilateral pulmonary embolism (HCC)   Hyperlipidemia   Diabetic peripheral neuropathy (HCC)   Chest pain     Subjective   Feels ok, tired. Denies chest pain.   Inpatient Medications    Scheduled Meds: . amLODipine  10 mg Oral Daily  . aspirin EC  81 mg Oral Daily  . atorvastatin  80 mg Oral q1800  . gabapentin  400 mg Oral TID  . Influenza vac split quadrivalent PF  0.5 mL Intramuscular Tomorrow-1000  . insulin aspart  0-9 Units Subcutaneous Q4H  . insulin glargine  15 Units Subcutaneous QHS  . metoprolol tartrate  25 mg Oral BID  . nitroGLYCERIN  1 inch Topical Q6H  . PARoxetine  40 mg Oral Q breakfast   Continuous Infusions: . sodium chloride Stopped (07/25/16 0535)  . sodium chloride 125 mL/hr at 07/25/16 0535  . heparin 1,550 Units/hr (07/24/16 2035)   PRN Meds: acetaminophen, diphenhydrAMINE, morphine injection, nitroGLYCERIN, ondansetron (ZOFRAN) IV, oxyCODONE-acetaminophen   Vital Signs    Vitals:   07/24/16 1950 07/25/16 0041 07/25/16 0047 07/25/16 0439  BP: (!) 145/85 (!) 146/79  (!) 147/84  Pulse: 91 69  69  Resp: 18  16 17   Temp: 98.3 F (36.8 C) 97.7 F (36.5 C)  97.9 F (36.6 C)  TempSrc: Axillary Oral  Axillary  SpO2: 94%  95% 97%  Weight:    235 lb 1.6 oz (106.6 kg)  Height:        Intake/Output Summary (Last 24 hours) at 07/25/16 0813 Last data filed at 07/25/16 0700  Gross per 24 hour  Intake          3133.75 ml  Output             3100 ml  Net            33.75 ml   Filed Weights   07/23/16 0242 07/24/16 0500 07/25/16 0439  Weight: 230 lb (104.3 kg) 235 lb 14.4 oz (107 kg) 235 lb 1.6 oz (106.6 kg)    Physical Exam   GEN: Well nourished, well developed, obese female in  no acute distress.  HEENT: Grossly normal.  Neck: Supple, no JVD, carotid bruits, or masses. Cardiac: RRR, no murmurs, rubs, or gallops. No clubbing, cyanosis, edema.  Radials/DP/PT 2+ and equal bilaterally.  Respiratory:  Respirations regular and unlabored, clear to auscultation bilaterally. GI: Soft, nontender, nondistended, BS + x 4. MS: no deformity or atrophy. Skin: warm and dry, no rash. Neuro:  Strength and sensation are intact. Psych: AAOx3.  Normal affect.  Labs    CBC  Recent Labs  07/24/16 0519 07/25/16 0329  WBC 8.4 10.4  HGB 11.1* 10.4*  HCT 33.5* 31.7*  MCV 88.6 89.0  PLT 222 [id number]   Basic Metabolic Panel  Recent Labs  07/24/16 0519 07/25/16 0329  NA 136 138  K 4.6 5.2*  CL 105 109  CO2 19* 21*  GLUCOSE 304* 145*  BUN 27* 23*  CREATININE 1.19* 1.07*  CALCIUM 8.6* 8.4*   Liver Function Tests  Recent Labs  07/24/16 0519 07/25/16 0329  AST 27 32  ALT 15 14  ALKPHOS 124 93  BILITOT 0.8 1.4*  PROT 6.3* 6.0*  ALBUMIN 2.7* 2.5*  Recent Labs  07/23/16 0913  LIPASE 28   Cardiac Enzymes  Recent Labs  07/23/16 0519 07/23/16 0913 07/23/16 1403  TROPONINI 0.04* 0.04* 0.04*   Fasting Lipid Panel  Recent Labs  07/23/16 1210  CHOL 265*  HDL 41  LDLCALC 169*  TRIG 277*  CHOLHDL 6.5   Thyroid Function Tests  Recent Labs  07/23/16 0913  TSH 0.382    Telemetry     NSR- Personally Reviewed  ECG    NSR with inferior ST depression- Personally Reviewed  Radiology    No results found.  Patient Profile     Ms. Tamara Henry is a 55 year old female with a past medical history of obesity, DM, HTN, HLD, coronary calcifications on CT, GERD and recently diagnosed bilateral PE's. She presented to the ED on 07/23/16 with chest pain, troponin 0.04 x 3.   Assessment & Plan    1. Chest pain concerning for unstable angina: Patient had chest pain that she describes as a squeezing, toothache like feeling in her chest with radiation to her left  arm. Denies associated SOB and diaphoresis.   She was recently diagnosed with bilateral PE's but says that this pain is different that when she had her PE's. She is on Xarelto outpatient, last dose was 07/23/16, on heparin gtt now.   She has risk factors for CAD, for cath today.   2. HTN: Remains hypertensive, her amlodipine was increased to 10mg  yesterday, will increase metoprolol to 50mg  BID.  3. HLD: Started on atorvastatin 80mg  based on AHA/ACC guidelines. Continue ezetimibe.   4. Poorly controlled IDDM: last A1c was 9.9.   5. History of bilateral PE: consider repeat CTPA to evaluate clot progression if cardiac work up is unrevealing.     Signed, Arbutus Leas, NP  07/25/2016, 8:13 AM

## 2016-07-25 NOTE — H&P (View-Only) (Signed)
Patient Name: Tamara Henry Date of Encounter: 07/24/2016  Primary Cardiologist: new   Hospital Problem List     Principal Problem:   Unstable angina [facility]) Active Problems:   DM type 2, uncontrolled, with renal complications ([place])   Hypertension   Bilateral pulmonary embolism (HCC)   Hyperlipidemia   Diabetic peripheral neuropathy (HCC)   Chest pain     Subjective     Inpatient Medications    Scheduled Meds: . amLODipine  10 mg Oral Daily  . aspirin EC  81 mg Oral Daily  . atorvastatin  80 mg Oral q1800  . gabapentin  400 mg Oral TID  . [START ON 07/25/2016] Influenza vac split quadrivalent PF  0.5 mL Intramuscular Tomorrow-1000  . insulin aspart  0-9 Units Subcutaneous Q4H  . insulin glargine  10 Units Subcutaneous Once  . insulin glargine  15 Units Subcutaneous QHS  . metoprolol tartrate  25 mg Oral BID  . nitroGLYCERIN  1 inch Topical Q6H  . PARoxetine  40 mg Oral Q breakfast   Continuous Infusions: . sodium chloride 75 mL/hr at 07/23/16 0842  . heparin 1,350 Units/hr (07/24/16 0144)   PRN Meds: acetaminophen, diphenhydrAMINE, morphine injection, nitroGLYCERIN, ondansetron (ZOFRAN) IV   Vital Signs    Vitals:   07/23/16 2058 07/24/16 0008 07/24/16 0500 07/24/16 0754  BP: (!) 146/82 (!) 143/89 (!) 151/77 (!) 149/93  Pulse: 82 83 84 91  Resp: 16 17 17 16   Temp: 97.6 F (36.4 C) 97.5 F (36.4 C) 98.9 F (37.2 C) 97.8 F (36.6 C)  TempSrc: Oral Oral Oral Oral  SpO2: 96% 97% 95% 99%  Weight:   235 lb 14.4 oz (107 kg)   Height:        Intake/Output Summary (Last 24 hours) at 07/24/16 0928 Last data filed at 07/24/16 0905  Gross per 24 hour  Intake           2013.9 ml  Output             1550 ml  Net            463.9 ml   Filed Weights   07/23/16 0242 07/24/16 0500  Weight: 230 lb (104.3 kg) 235 lb 14.4 oz (107 kg)    Physical Exam    GEN: Well nourished, well developed, in no acute distress.  HEENT: Grossly normal.  Neck: Supple, no JVD,  carotid bruits, or masses. Cardiac: RRR, no murmurs, rubs, or gallops. No clubbing, cyanosis, edema.  Radials/DP/PT 2+ and equal bilaterally.  Respiratory:  Respirations regular and unlabored, clear to auscultation bilaterally. GI: Soft, nontender, nondistended, BS + x 4. MS: no deformity or atrophy. Skin: warm and dry, no rash. Neuro:  Strength and sensation are intact. Psych: AAOx3.  Normal affect.  Labs    CBC  Recent Labs  07/23/16 0913 07/24/16 0519  WBC 7.9 8.4  HGB 10.4* 11.1*  HCT 32.8* 33.5*  MCV 91.1 88.6  PLT 215 [id number]   Basic Metabolic Panel  Recent Labs  07/23/16 0250 07/24/16 0519  NA 137 136  K 4.1 4.6  CL 106 105  CO2 22 19*  GLUCOSE 331* 304*  BUN 29* 27*  CREATININE 1.66* 1.19*  CALCIUM 8.3* 8.6*   Liver Function Tests  Recent Labs  07/23/16 0913 07/24/16 0519  AST 18 27  ALT 13* 15  ALKPHOS 112 124  BILITOT 0.4 0.8  PROT 6.4* 6.3*  ALBUMIN 2.6* 2.7*    Recent Labs  07/23/16 0913  LIPASE 28   Cardiac Enzymes  Recent Labs  07/23/16 0519 07/23/16 0913 07/23/16 1403  TROPONINI 0.04* 0.04* 0.04*   Fasting Lipid Panel  Recent Labs  07/23/16 1210  CHOL 265*  HDL 41  LDLCALC 169*  TRIG 277*  CHOLHDL 6.5   Thyroid Function Tests  Recent Labs  07/23/16 0913  TSH 0.382    Telemetry     NSR- Personally Reviewed  ECG    NSR with inferior ST depression - Personally Reviewed  Radiology    Dg Chest 2 View  Result Date: 07/23/2016 CLINICAL DATA:  55 y/o F; left-sided chest pain radiating down the left arm for 2 hours. Shortness of breath. EXAM: CHEST  2 VIEW COMPARISON:  05/17/2016 chest radiograph. FINDINGS: The heart size and mediastinal contours are within normal limits and stable. Both lungs are clear. The visualized skeletal structures are unremarkable. IMPRESSION: No active cardiopulmonary disease. Electronically Signed   By: Kristine Garbe M.D.   On: 07/23/2016 03:16    Patient Profile     Ms.  Tamara Henry is a 55 year old female with a past medical history of obesity, DM, HTN, HLD, coronary calcifications on CT, GERD and recently diagnosed bilateral PE's. She presented to the ED on 07/23/16 with chest pain, troponin 0.04 x 3.   Assessment & Plan    1. Chest pain concerning for unstable angina: Patient had chest pain that she describes as a squeezing, toothache like feeling in her chest with radiation to her left arm. Denies associated SOB and diaphoresis.   She was recently diagnosed with bilateral PE's but says that this pain is different that when she had her PE's. She is on Xarelto outpatient, last dose was 07/23/16, on heparin gtt now.   She has risk factors for CAD, will need stress test vs. Cath. MD to advise.   2. HTN: Remains hypertensive, her amlodipine was increased to 10mg  yesterday, HCTZ was increased to 25mg  yesterday, and on metoprolol which we can titrate up today as well.   3. HLD: Started on atorvastatin 80mg  based on AHA/ACC guidelines. Continue ezetimibe.   4. Poorly controlled IDDM: last A1c was 9.9.   5. History of bilateral PE: consider repeat CTPA to evaluate clot progression if cardiac work up is unrevealing.    Signed, Arbutus Leas, NP  07/24/2016, 9:28 AM

## 2016-07-25 NOTE — Interval H&P Note (Signed)
History and Physical Interval Note:  07/25/2016 9:51 AM  Tamara Henry  has presented today for surgery, with the diagnosis of unstable angina  The various methods of treatment have been discussed with the patient and family. After consideration of risks, benefits and other options for treatment, the patient has consented to  Procedure(s): Left Heart Cath and Coronary Angiography (N/A) as a surgical intervention .  The patient's history has been reviewed, patient examined, no change in status, stable for surgery.  I have reviewed the patient's chart and labs.  Questions were answered to the patient's satisfaction.    Cath Lab Visit (complete for each Cath Lab visit)  Clinical Evaluation Leading to the Procedure:   ACS: Yes.    Non-ACS:    Anginal Classification: CCS III  Anti-ischemic medical therapy: Maximal Therapy (2 or more classes of medications)  Non-Invasive Test Results: No non-invasive testing performed  Prior CABG: No previous CABG       Collier Salina [facility] 07/25/2016 9:51 AM

## 2016-07-25 NOTE — Progress Notes (Signed)
ANTICOAGULATION CONSULT NOTE - Follow Up Consult  Pharmacy Consult for Heparin  Indication: chest pain/ACS, Hx PE (Xarelto on hold)  Patient Measurements: Height: 5\' 4"  (162.6 cm) Weight: 235 lb 1.6 oz (106.6 kg) IBW/kg (Calculated) : 54.7  Adjusted body weight: 80 kg  Vital Signs: Temp: 98.3 F (36.8 C) (11/14 1140) Temp Source: Oral (11/14 1140) BP: 155/79 (11/14 1140) Pulse Rate: 70 (11/14 1041)  Labs:  Recent Labs  07/23/16 0250 07/23/16 0519 07/23/16 0913  07/23/16 1403 07/23/16 2302 07/24/16 0519 07/24/16 1033 07/24/16 1950 07/25/16 0329  HGB 10.9*  --  10.4*  --   --   --  11.1*  --   --  10.4*  HCT 33.1*  --  32.8*  --   --   --  33.5*  --   --  31.7*  PLT 240  --  215  --   --   --  222  --   --  235  APTT  --   --   --   < > 61* 51*  --  62* 72*  --   LABPROT  --   --  18.9*  --   --   --   --   --   --   --   INR  --   --  1.57  --   --   --   --   --   --   --   HEPARINUNFRC  --   --   --   --  1.00*  --   --  0.64  --  0.72*  CREATININE 1.66*  --   --   --   --   --  1.19*  --   --  1.07*  TROPONINI  --  0.04* 0.04*  --  0.04*  --   --   --   --   --   < > = values in this interval not displayed.  Estimated Creatinine Clearance: 72.5 mL/min (by C-G formula based on SCr of 1.07 mg/dL (H)).   Assessment: 55 y/o female on Xarelto PTA (started 05/18/16) for bilateral PE (diagnosed 05/17/16) admitted with chest pain. Xarelto held on admit 07/23/16 and started on IV heparin drip on 07/23/16 . Last Xarelto 11/11 AM dose patient. He was on the 20 mg daily with supper dosage.  Heparin level this AM = 0.72 on heparin 1550 units/hr. 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 CTsurgery consulted to consider CABG Heparin drip to be resumed 8 hours after sheath removal. Sheath documented removed at 10:38 this AM.  No bleeding , no hematoma noted.  Hgb 10.4, Hct 31.7, pltc  235K  Goal of Therapy:  Heparin level 0.3-0.7 units/ml aPTT 66-102 seconds      Plan:  Resume heparin drip 8hr post sheath removal, thus at 18:30 resume heparin drip 1500 units/hr.  Check 6 hour heparin level/aPTT/CBC Daily heparin level and aPTT until correlating Daily CBC Monitor for s/sx of bleeding  Nicole Cella, RPh Clinical Pharmacist Phone 8am-3:30p- [phone] Phone 330p-1030p- [place] - [phone] 07/25/2016 12:00 PM

## 2016-07-26 ENCOUNTER — Inpatient Hospital Stay (HOSPITAL_COMMUNITY): Payer: Medicare Other

## 2016-07-26 DIAGNOSIS — I2699 Other pulmonary embolism without acute cor pulmonale: Secondary | ICD-10-CM

## 2016-07-26 DIAGNOSIS — Z888 Allergy status to other drugs, medicaments and biological substances status: Secondary | ICD-10-CM

## 2016-07-26 DIAGNOSIS — Z880 Allergy status to penicillin: Secondary | ICD-10-CM

## 2016-07-26 DIAGNOSIS — E1121 Type 2 diabetes mellitus with diabetic nephropathy: Secondary | ICD-10-CM

## 2016-07-26 DIAGNOSIS — Z833 Family history of diabetes mellitus: Secondary | ICD-10-CM

## 2016-07-26 DIAGNOSIS — Z01810 Encounter for preprocedural cardiovascular examination: Secondary | ICD-10-CM

## 2016-07-26 DIAGNOSIS — Z8489 Family history of other specified conditions: Secondary | ICD-10-CM

## 2016-07-26 DIAGNOSIS — F331 Major depressive disorder, recurrent, moderate: Secondary | ICD-10-CM

## 2016-07-26 DIAGNOSIS — Z7982 Long term (current) use of aspirin: Secondary | ICD-10-CM

## 2016-07-26 DIAGNOSIS — Z79899 Other long term (current) drug therapy: Secondary | ICD-10-CM

## 2016-07-26 DIAGNOSIS — I2510 Atherosclerotic heart disease of native coronary artery without angina pectoris: Secondary | ICD-10-CM

## 2016-07-26 DIAGNOSIS — Z87891 Personal history of nicotine dependence: Secondary | ICD-10-CM

## 2016-07-26 DIAGNOSIS — E1165 Type 2 diabetes mellitus with hyperglycemia: Secondary | ICD-10-CM

## 2016-07-26 DIAGNOSIS — Z794 Long term (current) use of insulin: Secondary | ICD-10-CM

## 2016-07-26 DIAGNOSIS — Z91040 Latex allergy status: Secondary | ICD-10-CM

## 2016-07-26 DIAGNOSIS — Z79891 Long term (current) use of opiate analgesic: Secondary | ICD-10-CM

## 2016-07-26 DIAGNOSIS — E785 Hyperlipidemia, unspecified: Secondary | ICD-10-CM

## 2016-07-26 LAB — CBC
HEMATOCRIT: 33.5 % — AB (ref 36.0–46.0)
HEMOGLOBIN: 10.7 g/dL — AB (ref 12.0–15.0)
MCH: 28.5 pg (ref 26.0–34.0)
MCHC: 31.9 g/dL (ref 30.0–36.0)
MCV: 89.1 fL (ref 78.0–100.0)
PLATELETS: 247 10*3/uL (ref 150–400)
RBC: 3.76 MIL/uL — AB (ref 3.87–5.11)
RDW: 13.6 % (ref 11.5–15.5)
WBC: 8.3 10*3/uL (ref 4.0–10.5)

## 2016-07-26 LAB — URINE MICROSCOPIC-ADD ON: Bacteria, UA: NONE SEEN

## 2016-07-26 LAB — APTT: APTT: 88 s — AB (ref 24–36)

## 2016-07-26 LAB — COMPREHENSIVE METABOLIC PANEL
ALK PHOS: 99 U/L (ref 38–126)
ALT: 13 U/L — AB (ref 14–54)
AST: 18 U/L (ref 15–41)
Albumin: 2.5 g/dL — ABNORMAL LOW (ref 3.5–5.0)
Anion gap: 9 (ref 5–15)
BUN: 16 mg/dL (ref 6–20)
CALCIUM: 8.6 mg/dL — AB (ref 8.9–10.3)
CHLORIDE: 104 mmol/L (ref 101–111)
CO2: 24 mmol/L (ref 22–32)
CREATININE: 1.14 mg/dL — AB (ref 0.44–1.00)
GFR calc Af Amer: >60 mL/min (ref >60)
GFR, EST NON AFRICAN AMERICAN: 54 mL/min — AB (ref >60)
Glucose, Bld: 144 mg/dL — ABNORMAL HIGH (ref 65–99)
Potassium: 4.2 mmol/L (ref 3.5–5.1)
Sodium: 137 mmol/L (ref 135–145)
Total Bilirubin: 0.4 mg/dL (ref 0.3–1.2)
Total Protein: 6.3 g/dL — ABNORMAL LOW (ref 6.5–8.1)

## 2016-07-26 LAB — URINALYSIS, ROUTINE W REFLEX MICROSCOPIC
Bilirubin Urine: NEGATIVE
Glucose, UA: NEGATIVE mg/dL
Ketones, ur: NEGATIVE mg/dL
Leukocytes, UA: NEGATIVE
Nitrite: NEGATIVE
Protein, ur: NEGATIVE mg/dL
Specific Gravity, Urine: 1.011 (ref 1.005–1.030)
pH: 6 (ref 5.0–8.0)

## 2016-07-26 LAB — PROTIME-INR
INR: 1
Prothrombin Time: 13.2 seconds (ref 11.4–15.2)

## 2016-07-26 LAB — T4, FREE: Free T4: 0.76 ng/dL (ref 0.61–1.12)

## 2016-07-26 LAB — GLUCOSE, CAPILLARY
GLUCOSE-CAPILLARY: 141 mg/dL — AB (ref 65–99)
Glucose-Capillary: 137 mg/dL — ABNORMAL HIGH (ref 65–99)
Glucose-Capillary: 177 mg/dL — ABNORMAL HIGH (ref 65–99)
Glucose-Capillary: 168 mg/dL — ABNORMAL HIGH (ref 65–99)
Glucose-Capillary: 183 mg/dL — ABNORMAL HIGH (ref 65–99)

## 2016-07-26 LAB — SURGICAL PCR SCREEN
MRSA, PCR: NEGATIVE
Staphylococcus aureus: NEGATIVE

## 2016-07-26 LAB — TSH: TSH: 0.197 u[IU]/mL — ABNORMAL LOW (ref 0.350–4.500)

## 2016-07-26 LAB — HEPARIN LEVEL (UNFRACTIONATED): Heparin Unfractionated: 0.7 IU/mL (ref 0.30–0.70)

## 2016-07-26 MED ORDER — LOSARTAN POTASSIUM 25 MG PO TABS
25.0000 mg | ORAL_TABLET | Freq: Every day | ORAL | Status: DC
  Administered 2016-07-26 – 2016-07-27 (×2): 25 mg via ORAL
  Filled 2016-07-26 (×2): qty 1

## 2016-07-26 MED ORDER — INSULIN ASPART 100 UNIT/ML ~~LOC~~ SOLN
0.0000 [IU] | Freq: Three times a day (TID) | SUBCUTANEOUS | Status: DC
  Administered 2016-07-26: 2 [IU] via SUBCUTANEOUS
  Administered 2016-07-27: 3 [IU] via SUBCUTANEOUS
  Administered 2016-07-27 (×2): 5 [IU] via SUBCUTANEOUS

## 2016-07-26 MED ORDER — INSULIN ASPART 100 UNIT/ML ~~LOC~~ SOLN
0.0000 [IU] | Freq: Every day | SUBCUTANEOUS | Status: DC
  Administered 2016-07-27: 2 [IU] via SUBCUTANEOUS

## 2016-07-26 MED ORDER — METRONIDAZOLE 500 MG PO TABS
2000.0000 mg | ORAL_TABLET | Freq: Once | ORAL | Status: AC
  Administered 2016-07-26: 2000 mg via ORAL
  Filled 2016-07-26: qty 4

## 2016-07-26 MED ORDER — ALBUTEROL SULFATE (2.5 MG/3ML) 0.083% IN NEBU: 2.5000 mg | INHALATION_SOLUTION | Freq: Once | RESPIRATORY_TRACT | Status: DC

## 2016-07-26 NOTE — Progress Notes (Signed)
Pre-op Cardiac Surgery  Carotid Findings:  Carotid duplex completed 05/2016- results in [facility].  Upper Extremity Right Left  Brachial Pressures 143-Triphasic Triphasic  Radial Waveforms Triphasic Triphasic  Ulnar Waveforms Triphasic Triphasic  Palmar Arch (Allen's Test) Signal decreases >50% with radial compression, is unaffected with ulnar compression. Signal is unaffected with radial compression, obliterates with ulnar compression.   Lower  Extremity Right Left  Dorsalis Pedis Triphasic Triphasic  Anterior Tibial    Posterior Tibial Triphasic Triphasic  Ankle/Brachial Indices 1.3 1.27    Findings:   ABIs are within normal limits at rest bilaterally.    Bilateral lower extremity venous duplex completed. Bilateral lower extremities are negative for deep vein thrombosis. There is no evidence of Baker's cyst bilaterally.  07/26/2016 4:37 PM Maudry Mayhew, BS, RVT, RDCS, RDMS

## 2016-07-26 NOTE — Consult Note (Signed)
[place] [facility]iat[facility] Consult   Reason for Consult:  depression Referring Physician:  Dr. Harlow Ohms Patient Identification: Tamara Henry MRN:  [id number] Principal Diagnosis: Unstable angina [facility]) Diagnosis:   Patient Active Problem List   Diagnosis Date Noted  . Coronary artery disease involving native heart without angina pectoris [I25.10]   . Chest pain [R07.9] 07/23/2016  . Unstable angina ([place]) [I20.0] 07/23/2016  . Syncope [R55] 05/18/2016  . Bilateral pulmonary embolism ([place]) [I26.99] 05/17/2016  . Hyperlipidemia [E78.5] 05/17/2016  . Depression [F32.9] 05/17/2016  . Diabetic peripheral neuropathy ([place]) [E11.42] 05/17/2016  . Type 2 diabetes mellitus ([place]) [E11.9] 05/17/2016  . Neuritis of upper extremity, C7 [M79.2] 01/15/2015  . Urinary retention [R33.9] 01/14/2015  . Acute renal failure ([place]) [N17.9] 01/14/2015  . DM type 2, uncontrolled, with renal complications ([place]) [O97.35, E11.65] 01/14/2015  . Hypertension [I10] 01/14/2015  . Bowel incontinence [R15.9] 01/14/2015  . Numbness and tingling of right arm [R20.0, R20.2] 01/14/2015    Total Time spent with patient: 45 minutes  Subjective:   Tamara Henry is a 55 y.o. female patient admitted with chest pain  HPI:  Tamara Henry is a 55 y.o. Female admitted to [facility] with the chest pain secondary to unstable angina. Patient has been scheduled to cardiac procedure CABG tomorrow. Patient requested psychiatric consultation for depression medication management. Patient reportedly suffering with depression for long time and has been receiving outpatient medication management from family service of [place] who recently told her they won't be able to continue her because she was able to acquired Medicare and Medicaid. Patient reportedly scheduled to seek psychiatric medications and counseling services at [facility] but not able to keep that appointment secondary to being hospitalized. Patient know that she needed  to be cardiac rehabilitation services after surgery and worried about getting her psychiatric medication while in the hospital and rehabilitation services. Patient denied current symptoms of depression anxiety, mania, psychosis and has no suicidal/homicidal ideation, intention or plans. Patient was issued that she's going to receive her home psychiatric medication while being in hospital. Patient also notified that she can seek [facility] outpatient medical services as a walk-in Monday to Friday.   She agreed for the advice and treatment plan.   Past Psychiatric History:  Patient has been diagnosed with major depressive disorder, recurrent and denied acute psychiatric hospitalization. Patient has been taking several medications including paxil, Neurontin, Valium and Xanax.  Risk to Self: Is patient at risk for suicide?: No Risk to Others:   Prior Inpatient Therapy:   Prior Outpatient Therapy:    Past Medical History:  Past Medical History:  Diagnosis Date  . Acid reflux   . Diabetes mellitus without complication ([place])   . High cholesterol   . Hx of chest tube placement right   . Hypertension   . Neuropathy ([place])   . Pneumonia     Past Surgical History:  Procedure Laterality Date  . CARDIAC CATHETERIZATION N/A 07/25/2016   Procedure: Left Heart Cath and Coronary Angiography;  Surgeon: Peter M Martinique, MD;  Location: [facility];  Service: Cardiovascular;  Laterality: N/A;  . TUBAL LIGATION     Family History:  Family History  Problem Relation Age of Onset  . Pancreatitis Mother   . Diabetes type II Mother   . CAD Father   . Diabetes type II Father   . Diabetes type II Sister   . Diabetes type II Brother    Family Psychiatric  History: Family history of depression is  significant especially in her daughter but does not seek treatment. Social History:  History  Alcohol Use No     History  Drug Use No    Social History   Social History  . Marital status: Married     Spouse name: N/A  . Number of children: 4  . Years of education: 12   Occupational History  . disabled    Social History Main Topics  . Smoking status: Former Smoker    Quit date: 09/11/2005  . Smokeless tobacco: Never Used  . Alcohol use No  . Drug use: No  . Sexual activity: Yes    Birth control/ protection: Surgical   Other Topics Concern  . None   Social History Narrative   Lives at home with daughter Lanelle Bal   Drinks no caffeine    Additional Social History:Patient has been living with her daughter, daughter's boyfriend and 4 children at home .    Allergies:   Allergies  Allergen Reactions  . Lactose Intolerance (Gi) Diarrhea  . Lisinopril Other (See Comments) and Cough    Inflammation, coughing  . Metformin And Related Diarrhea  . Reglan [Metoclopramide] Other (See Comments)    Pt stated having lock jaw as a side effect  . Sulfa Antibiotics Itching  . Wellbutrin [Bupropion] Nausea And Vomiting and Cough  . Latex Rash  . Penicillins Rash    ALL -CILINS Has patient had a PCN reaction causing immediate rash, facial/tongue/throat swelling, SOB or lightheadedness with hypotension: Yes Has patient had a PCN reaction causing severe rash involving mucus membranes or skin necrosis: No Has patient had a PCN reaction that required hospitalization No Has patient had a PCN reaction occurring within the last 10 years: Yes If all of the above answers are "NO", then may proceed with Cephalosporin use.   . Tape Rash    Labs:  Results for orders placed or performed during the hospital encounter of 07/23/16 (from the past 48 hour(s))  Glucose, capillary     Status: Abnormal   Collection Time: 07/24/16  4:40 PM  Result Value Ref Range   Glucose-Capillary 212 (H) 65 - 99 mg/dL  Glucose, capillary     Status: Abnormal   Collection Time: 07/24/16  7:48 PM  Result Value Ref Range   Glucose-Capillary 326 (H) 65 - 99 mg/dL  APTT     Status: Abnormal   Collection Time: 07/24/16   7:50 PM  Result Value Ref Range   aPTT 72 (H) 24 - 36 seconds    Comment:        IF BASELINE aPTT IS ELEVATED, SUGGEST PATIENT RISK ASSESSMENT BE USED TO DETERMINE APPROPRIATE ANTICOAGULANT THERAPY.   Glucose, capillary     Status: Abnormal   Collection Time: 07/24/16 10:13 PM  Result Value Ref Range   Glucose-Capillary 250 (H) 65 - 99 mg/dL  Glucose, capillary     Status: Abnormal   Collection Time: 07/25/16 12:45 AM  Result Value Ref Range   Glucose-Capillary 176 (H) 65 - 99 mg/dL  CBC     Status: Abnormal   Collection Time: 07/25/16  3:29 AM  Result Value Ref Range   WBC 10.4 4.0 - 10.5 K/uL    Comment: REPEATED TO VERIFY WHITE COUNT CONFIRMED ON SMEAR    RBC 3.56 (L) 3.87 - 5.11 MIL/uL   Hemoglobin 10.4 (L) 12.0 - 15.0 g/dL   HCT 31.7 (L) 36.0 - 46.0 %   MCV 89.0 78.0 - 100.0 fL   MCH 29.2  26.0 - 34.0 pg   MCHC 32.8 30.0 - 36.0 g/dL   RDW 13.9 11.5 - 15.5 %   Platelets 235 150 - 400 K/uL    Comment: SPECIMEN CHECKED FOR CLOTS REPEATED TO VERIFY PLATELET COUNT CONFIRMED BY SMEAR   Comprehensive metabolic panel     Status: Abnormal   Collection Time: 07/25/16  3:29 AM  Result Value Ref Range   Sodium 138 135 - 145 mmol/L   Potassium 5.2 (H) 3.5 - 5.1 mmol/L    Comment: SPECIMEN HEMOLYZED. HEMOLYSIS MAY AFFECT INTEGRITY OF RESULTS.   Chloride 109 101 - 111 mmol/L   CO2 21 (L) 22 - 32 mmol/L   Glucose, Bld 145 (H) 65 - 99 mg/dL   BUN 23 (H) 6 - 20 mg/dL   Creatinine, Ser 1.07 (H) 0.44 - 1.00 mg/dL   Calcium 8.4 (L) 8.9 - 10.3 mg/dL   Total Protein 6.0 (L) 6.5 - 8.1 g/dL   Albumin 2.5 (L) 3.5 - 5.0 g/dL   AST 32 15 - 41 U/L   ALT 14 14 - 54 U/L   Alkaline Phosphatase 93 38 - 126 U/L   Total Bilirubin 1.4 (H) 0.3 - 1.2 mg/dL   GFR calc non Af Amer 58 (L) >60 mL/min   GFR calc Af Amer >60 >60 mL/min    Comment: (NOTE) The eGFR has been calculated using the CKD EPI equation. This calculation has not been validated in all clinical situations. eGFR's persistently  <60 mL/min signify possible Chronic Kidney Disease.    Anion gap 8 5 - 15  Heparin level (unfractionated)     Status: Abnormal   Collection Time: 07/25/16  3:29 AM  Result Value Ref Range   Heparin Unfractionated 0.72 (H) 0.30 - 0.70 IU/mL    Comment:        IF HEPARIN RESULTS ARE BELOW EXPECTED VALUES, AND PATIENT DOSAGE HAS BEEN CONFIRMED, SUGGEST FOLLOW UP TESTING OF ANTITHROMBIN III LEVELS.   Glucose, capillary     Status: Abnormal   Collection Time: 07/25/16  4:39 AM  Result Value Ref Range   Glucose-Capillary 147 (H) 65 - 99 mg/dL  Glucose, capillary     Status: Abnormal   Collection Time: 07/25/16  7:46 AM  Result Value Ref Range   Glucose-Capillary 164 (H) 65 - 99 mg/dL  Urinalysis, Routine w reflex microscopic (not at [facility])     Status: Abnormal   Collection Time: 07/25/16  8:07 AM  Result Value Ref Range   Color, Urine YELLOW YELLOW   APPearance CLEAR CLEAR   Specific Gravity, Urine 1.011 1.005 - 1.030   pH 6.0 5.0 - 8.0   Glucose, UA NEGATIVE NEGATIVE mg/dL   Hgb urine dipstick TRACE (A) NEGATIVE   Bilirubin Urine NEGATIVE NEGATIVE   Ketones, ur NEGATIVE NEGATIVE mg/dL   Protein, ur NEGATIVE NEGATIVE mg/dL   Nitrite NEGATIVE NEGATIVE   Leukocytes, UA NEGATIVE NEGATIVE  Urine microscopic-add on     Status: Abnormal   Collection Time: 07/25/16  8:07 AM  Result Value Ref Range   Squamous Epithelial / LPF 6-30 (A) NONE SEEN   WBC, UA 0-5 0 - 5 WBC/hpf   RBC / HPF 0-5 0 - 5 RBC/hpf   Bacteria, UA NONE SEEN NONE SEEN   Urine-Other TRICHOMONAS PRESENT   Glucose, capillary     Status: Abnormal   Collection Time: 07/25/16 11:37 AM  Result Value Ref Range   Glucose-Capillary 129 (H) 65 - 99 mg/dL  Blood gas, arterial  Status: Abnormal   Collection Time: 07/25/16  3:20 PM  Result Value Ref Range   FIO2 21.00    Delivery systems ROOM AIR    pH, Arterial 7.377 7.350 - 7.450   pCO2 arterial 45.3 32.0 - 48.0 mmHg   pO2, Arterial 54.1 (L) 83.0 - 108.0 mmHg    Bicarbonate 26.0 20.0 - 28.0 mmol/L   Acid-Base Excess 1.4 0.0 - 2.0 mmol/L   O2 Saturation 86.0 %   Patient temperature 98.6    Collection site LEFT BRACHIAL    Drawn [phone]    Sample type ARTERIAL DRAW    Allens test (pass/fail) PASS PASS  Glucose, capillary     Status: Abnormal   Collection Time: 07/25/16  4:24 PM  Result Value Ref Range   Glucose-Capillary 142 (H) 65 - 99 mg/dL  Glucose, capillary     Status: Abnormal   Collection Time: 07/25/16  9:38 PM  Result Value Ref Range   Glucose-Capillary 247 (H) 65 - 99 mg/dL  CBC     Status: Abnormal   Collection Time: 07/26/16  4:22 AM  Result Value Ref Range   WBC 8.3 4.0 - 10.5 K/uL   RBC 3.76 (L) 3.87 - 5.11 MIL/uL   Hemoglobin 10.7 (L) 12.0 - 15.0 g/dL   HCT 33.5 (L) 36.0 - 46.0 %   MCV 89.1 78.0 - 100.0 fL   MCH 28.5 26.0 - 34.0 pg   MCHC 31.9 30.0 - 36.0 g/dL   RDW 13.6 11.5 - 15.5 %   Platelets 247 150 - 400 K/uL  Comprehensive metabolic panel     Status: Abnormal   Collection Time: 07/26/16  4:22 AM  Result Value Ref Range   Sodium 137 135 - 145 mmol/L   Potassium 4.2 3.5 - 5.1 mmol/L    Comment: DELTA CHECK NOTED   Chloride 104 101 - 111 mmol/L   CO2 24 22 - 32 mmol/L   Glucose, Bld 144 (H) 65 - 99 mg/dL   BUN 16 6 - 20 mg/dL   Creatinine, Ser 1.14 (H) 0.44 - 1.00 mg/dL   Calcium 8.6 (L) 8.9 - 10.3 mg/dL   Total Protein 6.3 (L) 6.5 - 8.1 g/dL   Albumin 2.5 (L) 3.5 - 5.0 g/dL   AST 18 15 - 41 U/L   ALT 13 (L) 14 - 54 U/L   Alkaline Phosphatase 99 38 - 126 U/L   Total Bilirubin 0.4 0.3 - 1.2 mg/dL   GFR calc non Af Amer 54 (L) >60 mL/min   GFR calc Af Amer >60 >60 mL/min    Comment: (NOTE) The eGFR has been calculated using the CKD EPI equation. This calculation has not been validated in all clinical situations. eGFR's persistently <60 mL/min signify possible Chronic Kidney Disease.    Anion gap 9 5 - 15  APTT     Status: Abnormal   Collection Time: 07/26/16  4:22 AM  Result Value Ref Range   aPTT 88  (H) 24 - 36 seconds    Comment:        IF BASELINE aPTT IS ELEVATED, SUGGEST PATIENT RISK ASSESSMENT BE USED TO DETERMINE APPROPRIATE ANTICOAGULANT THERAPY.   Heparin level (unfractionated)     Status: None   Collection Time: 07/26/16  4:22 AM  Result Value Ref Range   Heparin Unfractionated 0.70 0.30 - 0.70 IU/mL    Comment:        IF HEPARIN RESULTS ARE BELOW EXPECTED VALUES, AND PATIENT DOSAGE HAS BEEN CONFIRMED,  SUGGEST FOLLOW UP TESTING OF ANTITHROMBIN III LEVELS.   Protime-INR     Status: None   Collection Time: 07/26/16  4:22 AM  Result Value Ref Range   Prothrombin Time 13.2 11.4 - 15.2 seconds   INR 1.00   TSH     Status: Abnormal   Collection Time: 07/26/16  4:22 AM  Result Value Ref Range   TSH 0.197 (L) 0.350 - 4.500 uIU/mL    Comment: Performed by a 3rd Generation assay with a functional sensitivity of <=0.01 uIU/mL.  Glucose, capillary     Status: Abnormal   Collection Time: 07/26/16  5:15 AM  Result Value Ref Range   Glucose-Capillary 141 (H) 65 - 99 mg/dL  Surgical pcr screen     Status: None   Collection Time: 07/26/16  5:30 AM  Result Value Ref Range   MRSA, PCR NEGATIVE NEGATIVE   Staphylococcus aureus NEGATIVE NEGATIVE    Comment:        The Xpert SA Assay (FDA approved for NASAL specimens in patients over 61 years of age), is one component of a comprehensive surveillance program.  Test performance has been validated by [facility] for patients greater than or equal to 89 year old. It is not intended to diagnose infection nor to guide or monitor treatment.   Glucose, capillary     Status: Abnormal   Collection Time: 07/26/16  7:47 AM  Result Value Ref Range   Glucose-Capillary 137 (H) 65 - 99 mg/dL   Comment 1 Document in Chart   Glucose, capillary     Status: Abnormal   Collection Time: 07/26/16 11:59 AM  Result Value Ref Range   Glucose-Capillary 177 (H) 65 - 99 mg/dL   Comment 1 Document in Chart     Current Facility-Administered  Medications  Medication Dose Route Frequency Provider Last Rate Last Dose  . 0.9 %  sodium chloride infusion  250 mL Intravenous PRN Peter M Martinique, MD      . acetaminophen (TYLENOL) tablet 650 mg  650 mg Oral Q4H PRN Lily Kocher, MD      . amLODipine (NORVASC) tablet 10 mg  10 mg Oral Daily Lily Kocher, MD   10 mg at 07/26/16 1112  . aspirin EC tablet 81 mg  81 mg Oral Daily Lily Kocher, MD   81 mg at 07/26/16 1111  . atorvastatin (LIPITOR) tablet 80 mg  80 mg Oral q1800 Lily Kocher, MD   80 mg at 07/25/16 1341  . diphenhydrAMINE (BENADRYL) capsule 25 mg  25 mg Oral Q4H PRN Jeryl Columbia, NP   25 mg at 07/23/16 2347  . gabapentin (NEURONTIN) capsule 400 mg  400 mg Oral TID Lily Kocher, MD   400 mg at 07/26/16 1111  . heparin ADULT infusion 100 units/mL (25000 units/272m sodium chloride 0.45%)  1,500 Units/hr Intravenous Continuous Peter M JMartinique MD 15 mL/hr at 07/25/16 2315 1,500 Units/hr at 07/25/16 2315  . Influenza vac split quadrivalent PF (FLUARIX) injection 0.5 mL  0.5 mL Intramuscular Tomorrow-1000 NReyne Dumas MD      . insulin aspart (novoLOG) injection 0-9 Units  0-9 Units Subcutaneous Q4H NLily Kocher MD   1 Units at 07/26/16 0542  . insulin glargine (LANTUS) injection 15 Units  15 Units Subcutaneous QHS NReyne Dumas MD   15 Units at 07/25/16 2149  . losartan (COZAAR) tablet 25 mg  25 mg Oral Daily TSueanne Margarita MD      . metoprolol (LOPRESSOR) tablet 50 mg  50 mg Oral BID Arbutus Leas, NP   50 mg at 07/26/16 1112  . morphine 2 MG/ML injection 2 mg  2 mg Intravenous Q4H PRN Lily Kocher, MD   2 mg at 07/24/16 0827  . nitroGLYCERIN (NITROGLYN) 2 % ointment 1 inch  1 inch Topical Q6H Lily Kocher, MD   1 inch at 07/26/16 0529  . nitroGLYCERIN (NITROSTAT) SL tablet 0.4 mg  0.4 mg Sublingual Q5 Min x 3 PRN Lily Kocher, MD      . ondansetron [facility]) injection 4 mg  4 mg Intravenous Q6H PRN Lily Kocher, MD      . oxyCODONE-acetaminophen (PERCOCET/ROXICET) 5-325 MG per  tablet 1 tablet  1 tablet Oral Q4H PRN Reyne Dumas, MD   1 tablet at 07/26/16 1110  . PARoxetine (PAXIL) tablet 40 mg  40 mg Oral Q breakfast Lily Kocher, MD   40 mg at 07/25/16 1341  . sodium chloride flush (NS) 0.9 % injection 3 mL  3 mL Intravenous Q12H Peter M Martinique, MD   3 mL at 07/25/16 2149  . sodium chloride flush (NS) 0.9 % injection 3 mL  3 mL Intravenous PRN Peter M Martinique, MD      . zolpidem [facility]) tablet 5 mg  5 mg Oral QHS PRN Gardiner Barefoot, NP   5 mg at 07/25/16 2156    Musculoskeletal: Strength & Muscle Tone: decreased Gait & Station: unable to stand Patient leans: N/A  Psychiatric Specialty Exam: Physical Exam as per history and physical   ROS denied symptoms of depression, anxiety and psychosis. Patient is willing to follow with current medications and outpatient ligation management. Patient has chest pain on arrival which has been controlled with medication treatment. No Fever-chills, No Headache, No changes with Vision or hearing, reports vertigo No problems swallowing food or Liquids, No Chest pain, Cough or Shortness of Breath, No Abdominal pain, No Nausea or Vommitting, Bowel movements are regular, No Blood in stool or Urine, No dysuria, No new skin rashes or bruises, No new joints pains-aches,  No new weakness, tingling, numbness in any extremity, No recent weight gain or loss, No polyuria, polydypsia or polyphagia,   A full 10 point Review of Systems was done, except as stated above, all other Review of Systems were negative.  Blood pressure (!) 142/74, pulse 78, temperature 98.3 F (36.8 C), temperature source Oral, resp. rate 16, height 5' 4"  (1.626 m), weight 106.9 kg (235 lb 9.6 oz), SpO2 96 %.Body mass index is 40.44 kg/m.  General Appearance: Casual  Eye Contact:  Good  Speech:  Clear and Coherent  Volume:  Normal  Mood:  Depressed  Affect:  Appropriate and Congruent  Thought Process:  Coherent and Goal Directed  Orientation:  Full  (Time, Place, and Person)  Thought Content:  WDL  Suicidal Thoughts:  No  Homicidal Thoughts:  No  Memory:  Immediate;   Good Recent;   Fair Remote;   Fair  Judgement:  Intact  Insight:  Good  Psychomotor Activity:  Decreased  Concentration:  Concentration: Good and Attention Span: Good  Recall:  Good  Fund of Knowledge:  Good  Language:  Good  Akathisia:  Yes  Handed:  Right  AIMS (if indicated):     Assets:  Communication Skills Desire for Improvement Financial Resources/Insurance Housing Leisure Time Resilience Social Support Transportation  ADL's:  Intact  Cognition:  WNL  Sleep:        Treatment Plan Summary: 55 years old female with  a history of major depressive disorder disorder recurrent and admitted for chest pain. Patient denied current symptoms of depression, anxiety, mania, psychosis and has no suicidal/homicidal ideation, intention or plans.  Daily contact with patient to assess and evaluate symptoms and progress in treatment and Medication management  Continue home psychiatric medication without any changes as below Paxil 40 mg daily for depression Neurontin 400 mg 3 times daily chronic pains Xanax 0.25 mg, 1-2 tablets every 4 hours as needed for anxiety Appreciate psychiatric consultation and we sign off as of today Please contact [phone] or [phone] if needs further assistance  Referred to the unit social service regarding providing information about [facility] outpatient psychiatric services when medically cleared and discharged  Disposition: No evidence of imminent risk to self or others at present.   Patient does not meet criteria for psychiatric inpatient admission. Supportive therapy provided about ongoing stressors.  Ambrose Finland, MD 07/26/2016 12:37 PM

## 2016-07-26 NOTE — Progress Notes (Signed)
ANTICOAGULATION CONSULT NOTE - Follow Up Consult  Pharmacy Consult for Heparin  Indication: chest pain/ACS, Hx PE (Xarelto on hold)  Patient Measurements: Height: 5\' 4"  (162.6 cm) Weight: 235 lb 9.6 oz (106.9 kg) IBW/kg (Calculated) : 54.7  Adjusted body weight: 80 kg  Vital Signs: Temp: 98.3 F (36.8 C) (11/15 1137) Temp Source: Oral (11/15 1137) BP: 142/74 (11/15 1137) Pulse Rate: 78 (11/15 1137)  Labs:  Recent Labs  07/23/16 1403  07/24/16 0519 07/24/16 1033 07/24/16 1950 07/25/16 0329 07/26/16 0422  HGB  --   < > 11.1*  --   --  10.4* 10.7*  HCT  --   --  33.5*  --   --  31.7* 33.5*  PLT  --   --  222  --   --  235 247  APTT 61*  < >  --  62* 72*  --  88*  LABPROT  --   --   --   --   --   --  13.2  INR  --   --   --   --   --   --  1.00  HEPARINUNFRC 1.00*  --   --  0.64  --  0.72* 0.70  CREATININE  --   --  1.19*  --   --  1.07* 1.14*  TROPONINI 0.04*  --   --   --   --   --   --   < > = values in this interval not displayed.  Estimated Creatinine Clearance: 68.1 mL/min (by C-G formula based on SCr of 1.14 mg/dL (H)).   Assessment: 55 y/o female now on IV heparin infusion for h/o Bilateral PE diagnosed 05/17/16 and CAD, s/p cath 07/25/16= severe 3 vessel obstructive CAD, plan for CABG on Friday 11/17th. Patient was on Xarelto PTA for h/o PE --on hold.. Today the HL 0.7 and aPTT = 88 seconds , heparin drip resumed last night 8hr post sheath removal.  Therapeutic HL, PTT.  We can stop monitoring aPTTs as HL correlating. Last Xarelto 11/11 AM No bleeding , no hematoma noted.  CBC stable, hgb 10.7 stable, pltc wnl  Goal of Therapy:  Heparin level 0.3-0.7 units/ml      Plan:  Continue IV heparin drip 1500 units/hr.  Daily heparin level and CBC Monitor for s/sx of bleeding  Thank you for allowing pharmacy to be part of this patients care team.  Nicole Cella, RPh Clinical Pharmacist Phone 8am-3:30p- [phone] Phone 330p-1030p- [place] -  [phone] 07/26/2016 1:20 PM

## 2016-07-26 NOTE — Progress Notes (Signed)
Patient ID: Tamara Henry, female   DOB: [birth year], 55 y.o.   MRN: [id number]  Patient was not able to evaluate today because she's been out of the unit for cardiology evaluation. Patient best friend is in room reported she may take sometime to come back to the room. Patient will be evaluated for tomorrow.   Isiac Breighner 07/26/2016 6:06 PM

## 2016-07-26 NOTE — Progress Notes (Signed)
Patient Name: Tamara Henry Date of Encounter: 07/26/2016  Primary Cardiologist: Dr. Sundra Aland Problem List     Principal Problem:   Unstable angina [facility]) Active Problems:   DM type 2, uncontrolled, with renal complications ([place])   Hypertension   Bilateral pulmonary embolism (HCC)   Hyperlipidemia   Diabetic peripheral neuropathy (HCC)   Chest pain     Subjective   Feels well this am. Denies chest pain. Says she slept well.   Inpatient Medications    Scheduled Meds: . amLODipine  10 mg Oral Daily  . aspirin EC  81 mg Oral Daily  . atorvastatin  80 mg Oral q1800  . gabapentin  400 mg Oral TID  . Influenza vac split quadrivalent PF  0.5 mL Intramuscular Tomorrow-1000  . insulin aspart  0-9 Units Subcutaneous Q4H  . insulin glargine  15 Units Subcutaneous QHS  . metoprolol tartrate  50 mg Oral BID  . nitroGLYCERIN  1 inch Topical Q6H  . PARoxetine  40 mg Oral Q breakfast  . sodium chloride flush  3 mL Intravenous Q12H   Continuous Infusions: . heparin 1,500 Units/hr (07/25/16 2315)   PRN Meds: sodium chloride, acetaminophen, diphenhydrAMINE, morphine injection, nitroGLYCERIN, ondansetron (ZOFRAN) IV, oxyCODONE-acetaminophen, sodium chloride flush, zolpidem   Vital Signs    Vitals:   07/25/16 2132 07/25/16 2134 07/26/16 0455 07/26/16 0752  BP:  (!) 168/87 (!) 145/77 (!) 155/91  Pulse: 87  72 78  Resp: (!) 22  20 18   Temp:   97.9 F (36.6 C) 98.1 F (36.7 C)  TempSrc:   Oral Oral  SpO2: 95%  93% 95%  Weight:   235 lb 9.6 oz (106.9 kg)   Height:        Intake/Output Summary (Last 24 hours) at 07/26/16 0941 Last data filed at 07/26/16 0929  Gross per 24 hour  Intake             2000 ml  Output             2000 ml  Net                0 ml   Filed Weights   07/24/16 0500 07/25/16 0439 07/26/16 0455  Weight: 235 lb 14.4 oz (107 kg) 235 lb 1.6 oz (106.6 kg) 235 lb 9.6 oz (106.9 kg)    Physical Exam   GEN: Well nourished, well developed, in no  acute distress.  HEENT: Grossly normal.  Neck: Supple, no JVD, carotid bruits, or masses. Cardiac: RRR, no murmurs, rubs, or gallops. No clubbing, cyanosis, edema.  Radials/DP/PT 2+ and equal bilaterally.  Respiratory:  Respirations regular and unlabored, clear to auscultation bilaterally. GI: Soft, nontender, nondistended, BS + x 4. MS: no deformity or atrophy. Skin: warm and dry, no rash. Neuro:  Strength and sensation are intact. Psych: AAOx3.  Normal affect.  Labs    CBC  Recent Labs  07/25/16 0329 07/26/16 0422  WBC 10.4 8.3  HGB 10.4* 10.7*  HCT 31.7* 33.5*  MCV 89.0 89.1  PLT 235 [id number]   Basic Metabolic Panel  Recent Labs  07/25/16 0329 07/26/16 0422  NA 138 137  K 5.2* 4.2  CL 109 104  CO2 21* 24  GLUCOSE 145* 144*  BUN 23* 16  CREATININE 1.07* 1.14*  CALCIUM 8.4* 8.6*   Liver Function Tests  Recent Labs  07/25/16 0329 07/26/16 0422  AST 32 18  ALT 14 13*  ALKPHOS 93 99  BILITOT 1.4* 0.4  PROT 6.0* 6.3*  ALBUMIN 2.5* 2.5*   Cardiac Enzymes  Recent Labs  07/23/16 1403  TROPONINI 0.04*   Fasting Lipid Panel  Recent Labs  07/23/16 1210  CHOL 265*  HDL 41  LDLCALC 169*  TRIG 277*  CHOLHDL 6.5   Thyroid Function Tests  Recent Labs  07/26/16 0422  TSH 0.197*    Telemetry    NSR - Personally Reviewed  ECG    NSR with inferior ST depression - Personally Reviewed  Radiology    No results found.  Cardiac Studies   Left Heart Cath and Coronary Angiography 07/25/16  The left ventricular systolic function is normal.  LV end diastolic pressure is normal.  The left ventricular ejection fraction is 55-65% by visual estimate.  Prox LAD to Mid LAD lesion, 85 %stenosed.  Mid LAD to Dist LAD lesion, 30 %stenosed.  Ost 1st Diag to 1st Diag lesion, 80 %stenosed.  Ost 2nd Diag to 2nd Diag lesion, 50 %stenosed.  Ost 1st Mrg to 1st Mrg lesion, 95 %stenosed.  2nd Mrg lesion, 70 %stenosed.  Prox Cx to Mid Cx lesion, 50  %stenosed.  Mid RCA lesion, 90 %stenosed.   1. Severe 3 vessel obstructive CAD 2. Normal LV function 3. Normal LV EDP  Diagnostic Diagram         Patient Profile     Ms. Reinholtz is a 55 year old female with a past medical history of obesity, DM, HTN, HLD, coronary calcifications on CT, GERD and recently diagnosed bilateral PE's. She presented to the ED on 07/23/16 with chest pain, troponin 0.04 x 3. Cath done, needs CABG for multi-vessel disease.   Assessment & Plan    1. CAD: Cath reveals multi-vessel disease, plan for CABG on Friday morning. Plan for bypass grafts to the LAD, diagonal, OM, and RCA.   Will continue heparin gtt.   2. HTN: Metoprolol increased yesterday to 50mg  BID, on amlodipine 10mg .  Remains hypertensive. MD to advise on increasing metoprolol or adding ARB.   3. HLD: Started on atorvastatin 80mg  based on AHA/ACC guidelines. Continue ezetimibe.   4. Poorly controlled IDDM: last A1c was 9.9. On sliding scale   5. History of bilateral PE: On Xarelto as an outpatient. On hold now.  Signed, Arbutus Leas, NP  07/26/2016, 9:41 AM

## 2016-07-26 NOTE — Progress Notes (Signed)
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 The pt has been in a limited stable of health over the past 2 months following her diagnosis of PE, notably ambulating with the assistance of a walker and requiring homecare nursing. As per cardiology chest pain consistent with unstable angina. Currently on a heparin drip, underwent cardiac cath 11/14 which showed multivessel disease and thoracic surgery was consulted and planned for CABG on 07/28/16..  Assessment and plan Unstable angina/chest pain/CAD Continue heparin drip  (last dose of Xarelto 11/11 morning around 10AM).    --Cardiology consulted,recent CT chest demonstrates coronary calcium in all 3 epicardial vessels.   Continue aspirin, statin, metoprolol, Cardiac cath revealed multivessel disease. Cardiothoracic surgery input appreciated and plan for CABG on 07/28/16.  Dyslipidemia-continue statin and Zetia.  Insulin-dependent Type 2 DM --Continue home dose of levemir, hemoglobin A1c 9.9 on 05/19/16 Continue to adjust insulin regimen. Mildly fluctuating but reasonable inpatient control. Will need better control of her DM as outpatient.  Diabetic neuropathy --Continue home dose of neurotin  CKD 3, baseline Creatinine around 1.5-1.6 , improved after IV fluids --HOLD HCTZ  continue IV  fluids if the patient needs cardiac cath --Creatinine has normalized.  History of depression --Continue Paxil, patient is requesting psyche consult , for stressors in her life . This has been requested and input pending.  HTN -increased metoprolol to 50mg  BID and continue amlodipine. Uncontrolled. Losartan 25 MG daily started 11/15. Monitor  Bilateral subsegmental PE -- On Xarelto as OP, on hold now due to impending CABG. Continue IV heparin drip per pharmacy. --Low index of suspicion for recurrent PE at this time.  Abnormal TFTs - TSH 0.197 (low), free T4: 0.76 (normal). Clinically euthyroid. Check free T3. Follow TFTs in 4-6 weeks.  Trichomoniasis - treat with Flagyl 2gm PO once.     DVT prophylaxsis heparin drip Code Status:  Full code Family Communication: Discussed in detail with patient's son at bedside. Disposition Plan: To be determined post CABG. Patient will likely transfer to ICU post procedure on Friday.      Consultants:  Cardiology  Cardiothoracic surgery  Procedures:  Cardiac cath 11/14  Antibiotics: Anti-infectives    Start     Dose/Rate Route Frequency Ordered Stop   07/24/16 1515  fluconazole (DIFLUCAN) tablet 100 mg     100 mg Oral  Once 07/24/16 1508 07/24/16 1744         HPI/Subjective: Patient denies chest pain, dyspnea or palpitations. Feels anxious and nervous about her upcoming surgery-reassured. No suicidal or homicidal ideations expressed.  Objective: Vitals:   07/25/16 2134 07/26/16 0455 07/26/16 0752 07/26/16 1137  BP: (!) 168/87 (!) 145/77 (!) 155/91 (!) 142/74  Pulse:  72 78 78  Resp:  20 18 16   Temp:  97.9 F (36.6 C) 98.1 F (36.7 C) 98.3 F (36.8 C)  TempSrc:  Oral  Oral Oral  SpO2:  93% 95% 96%  Weight:  106.9 kg (235 lb 9.6 oz)    Height:        Intake/Output Summary (Last 24 hours) at 07/26/16 1623 Last data filed at 07/26/16 1300  Gross per 24 hour  Intake             2220 ml  Output             2000 ml   Net              220 ml    Exam:  Examination:  General exam: Appears calm and comfortable  Respiratory system: Clear to auscultation. Respiratory effort normal. Cardiovascular system: S1 & S2 heard, RRR. No JVD, murmurs, rubs, gallops or clicks. No pedal edema.Telemetry: Sinus rhythm. Gastrointestinal system: Abdomen is nondistended, soft and nontender. No organomegaly or masses felt. Normal bowel sounds heard. Central nervous system: Alert and oriented. No focal neurological deficits. Extremities: Symmetric 5 x 5 power. Skin: No rashes, lesions or ulcers Psychiatry: Judgement and insight appear normal. Mood & affect appropriate.     Data Reviewed: I have personally reviewed following labs and imaging studies  Micro Results Recent Results (from the past 240 hour(s))  MRSA PCR Screening     Status: None   Collection Time: 07/23/16  7:22 AM  Result Value Ref Range Status   MRSA by PCR NEGATIVE NEGATIVE Final    Comment:        The GeneXpert MRSA Assay (FDA approved for NASAL specimens only), is one component of a comprehensive MRSA colonization surveillance program. It is not intended to diagnose MRSA infection nor to guide or monitor treatment for MRSA infections.   Surgical pcr screen     Status: None   Collection Time: 07/26/16  5:30 AM  Result Value Ref Range Status   MRSA, PCR NEGATIVE NEGATIVE Final   Staphylococcus aureus NEGATIVE NEGATIVE Final    Comment:        The Xpert SA Assay (FDA approved for NASAL specimens in patients over 29 years of age), is one component of a comprehensive surveillance program.  Test performance has been validated by [facility] for patients greater than or equal to 70 year old. It is not intended to diagnose infection nor to guide or monitor treatment.     Radiology Reports Dg Chest 2 View  Result Date: 07/23/2016 CLINICAL DATA:  55 y/o F; left-sided chest pain radiating down the left arm for 2 hours. Shortness of  breath. EXAM: CHEST  2 VIEW COMPARISON:  05/17/2016 chest radiograph. FINDINGS: The heart size and mediastinal contours are within normal limits and stable. Both lungs are clear. The visualized skeletal structures are unremarkable. IMPRESSION: No active cardiopulmonary disease. Electronically Signed   By: Kristine Garbe M.D.   On: 07/23/2016 03:16     CBC  Recent Labs Lab 07/23/16 0250 07/23/16 0913 07/24/16 0519 07/25/16 0329 07/26/16 0422  WBC 9.1 7.9 8.4 10.4 8.3  HGB 10.9* 10.4* 11.1* 10.4* 10.7*  HCT 33.1* 32.8* 33.5* 31.7* 33.5*  PLT 240 215 222 235 247  MCV 89.2 91.1 88.6 89.0 89.1  MCH 29.4 28.9 29.4 29.2 28.5  MCHC 32.9 31.7 33.1 32.8 31.9  RDW 13.4 13.3 13.9 13.9 13.6    Chemistries   Recent Labs Lab 07/23/16 0250 07/23/16 0913 07/24/16 0519 07/25/16 0329 07/26/16 0422  NA 137  --  136 138 137  K 4.1  --  4.6 5.2* 4.2  CL 106  --  105 109 104  CO2 22  --  19* 21* 24  GLUCOSE 331*  --  304* 145* 144*  BUN 29*  --  27* 23* 16  CREATININE 1.66*  --  1.19* 1.07* 1.14*  CALCIUM 8.3*  --  8.6* 8.4* 8.6*  AST  --  18 27 32 18  ALT  --  13* 15 14 13*  ALKPHOS  --  112 124 93 99  BILITOT  --  0.4 0.8 1.4* 0.4   ------------------------------------------------------------------------------------------------------------------ estimated creatinine clearance is 68.1 mL/min (by C-G formula based on SCr of 1.14 mg/dL (H)). ------------------------------------------------------------------------------------------------------------------ No results for input(s): HGBA1C in the last 72 hours. ------------------------------------------------------------------------------------------------------------------ No results for input(s): CHOL, HDL, LDLCALC, TRIG, CHOLHDL, LDLDIRECT in the last 72 hours. ------------------------------------------------------------------------------------------------------------------  Recent Labs  07/26/16 0422  TSH 0.197*    ------------------------------------------------------------------------------------------------------------------ No results for input(s): VITAMINB12, FOLATE, FERRITIN, TIBC, IRON, RETICCTPCT in the last 72 hours.  Coagulation profile  Recent Labs Lab 07/23/16 0913 07/26/16 0422  INR 1.57 1.00    No results for input(s): DDIMER in the last 72 hours.  Cardiac Enzymes  Recent Labs Lab 07/23/16 0519 07/23/16 0913 07/23/16 1403  TROPONINI 0.04* 0.04* 0.04*   ------------------------------------------------------------------------------------------------------------------ Invalid input(s): POCBNP   CBG:  Recent Labs Lab 07/25/16 2138 07/26/16 0515 07/26/16 0747 07/26/16 1159 07/26/16 1620  GLUCAP 247* 141* 137* 177* 168*       Studies: No results found.    Lab Results  Component Value Date   HGBA1C 9.9 (H) 05/19/2016   HGBA1C 10.3 (H) 01/14/2015   Lab Results  Component Value Date   LDLCALC 169 (H) 07/23/2016   CREATININE 1.14 (H) 07/26/2016       Scheduled Meds: . amLODipine  10 mg Oral Daily  . aspirin EC  81 mg Oral Daily  . atorvastatin  80 mg Oral q1800  . gabapentin  400 mg Oral TID  . Influenza vac split quadrivalent PF  0.5 mL Intramuscular Tomorrow-1000  . insulin aspart  0-9 Units Subcutaneous Q4H  . insulin glargine  15 Units Subcutaneous QHS  . losartan  25 mg Oral Daily  . metoprolol tartrate  50 mg Oral BID  . nitroGLYCERIN  1 inch Topical Q6H  . PARoxetine  40 mg Oral Q breakfast  . sodium chloride flush  3 mL Intravenous Q12H   Continuous Infusions: . heparin 1,500 Units/hr (07/25/16 2315)     LOS: 2 days    Tamara Cihlar, MD, FACP, FHM. [facility] Pager [phone]  If 7PM-7AM, please contact night-coverage [email or web address] Password [facility] 07/26/2016, 4:35 PM

## 2016-07-26 NOTE — Progress Notes (Signed)
CARDIAC REHAB PHASE I   PRE:  Rate/Rhythm: 77 SR  BP:  Sitting: 133/78        SaO2: 94 RA  MODE:  Ambulation: 300 ft   POST:  Rate/Rhythm: 98 SR  BP:  Sitting: 157/78         SaO2: 98 RA  Pt ambulated 300 ft on RA, IV, rolling walker, assist x1, steady gait, tolerated well with no complaints other than some fatigue. Cardiac surgery pre-op education completed. Reviewed IS, sternal precautions, activity progression, cardiac surgery booklet and cardiac surgery guidelines.Left instructions to view cardiac surgery videos. Pt verbalized understanding. Pt to recliner after walk, feet elevated, call bell within reach. Will follow.   [id number] Lenna Sciara, RN, BSN 07/26/2016 11:12 AM

## 2016-07-26 NOTE — Progress Notes (Signed)
   07/26/16 1508  Clinical Encounter Type  Visited With Patient  Visit Type Follow-up;Spiritual support  Consult/Referral To Chaplain  Recommendations (follow)  Spiritual Encounters  Spiritual Needs Emotional  Stress Factors  Patient Stress Factors Family relationships;Financial concerns;Health changes;Major life changes  Family Stress Factors Family relationships;Financial concerns;Health changes;Major life changes  Chaplain was able to consult with pt., pt. Requested a follow-up visit. Pt has many stressors financial, insurance, family concerns surrounding these stressors, pt.has a strong faith but has some stress and frustration over things that are out of pt.control, but also has a Social research officer, government support from [employer], etc that has also visited with patient. Pt is going for open heart surgery, has some natural fears, concerns, and doubts about the surgery.  Chaplain was able to give an emotional and spiritual support, ministry of presence, will also still follow-up with patient.  [employer] [phone]

## 2016-07-26 NOTE — Progress Notes (Signed)
   07/26/16 1008  Clinical Encounter Type  Visited With Family;Patient not available  Visit Type Follow-up;Spiritual support  Referral From Chaplain  Consult/Referral To Chaplain  Spiritual Encounters  Spiritual Needs Emotional  Stress Factors  Patient Stress Factors Family relationships;Financial concerns;Health changes  Family Stress Factors Family relationships;Financial concerns;Health changes  Pt. Was not in the room, however, Chaplain was able to speak with family member, a Son is present.  Chaplain informed the Son of recent missed visit for spiritual consult, will still follow-up later today once patient returns back to room from test, suggested another visit.  Pastoral Care and communication with Son, no real concerns at the moment.  [employer] [phone]

## 2016-07-26 NOTE — Plan of Care (Signed)
Problem: Education: Goal: Knowledge of [facility] Education information/materials will improve Outcome: Progressing Patient aware of plan of care.  RN provided medication education to patient on all medications administered thus far this shift.  Patient stated understanding.  RN instructed patient to call and wait for staff assistance prior to getting out of bed so staff could assist with managing tethering equipment.  Patient stated understanding and has called appropriately and waited for staff assistance when needing to get out of bed.

## 2016-07-26 NOTE — Progress Notes (Signed)
Pt was brought down to the lab for a PFT. Pt was unable to perform test at all due to unable to get on the mouthpiece correctly. She just had oral surgery & is unable to close her mouth around mouthpiece to create a good seal. Tried to do it without the mouthpiece & just have her close her lips around the pneumotach, but same result. Called & spoke with RN at [place] office to inform the MD we were unable to do PFT.  Kathie Dike RRT

## 2016-07-27 ENCOUNTER — Inpatient Hospital Stay (HOSPITAL_COMMUNITY): Payer: Medicare Other

## 2016-07-27 DIAGNOSIS — I25110 Atherosclerotic heart disease of native coronary artery with unstable angina pectoris: Secondary | ICD-10-CM

## 2016-07-27 LAB — GLUCOSE, CAPILLARY
GLUCOSE-CAPILLARY: 286 mg/dL — AB (ref 65–99)
GLUCOSE-CAPILLARY: 234 mg/dL — AB (ref 65–99)
Glucose-Capillary: 181 mg/dL — ABNORMAL HIGH (ref 65–99)
Glucose-Capillary: 212 mg/dL — ABNORMAL HIGH (ref 65–99)
Glucose-Capillary: 298 mg/dL — ABNORMAL HIGH (ref 65–99)

## 2016-07-27 LAB — PREPARE RBC (CROSSMATCH)

## 2016-07-27 LAB — CBC
HCT: 34.2 % — ABNORMAL LOW (ref 36.0–46.0)
Hemoglobin: 10.9 g/dL — ABNORMAL LOW (ref 12.0–15.0)
MCH: 28.5 pg (ref 26.0–34.0)
MCHC: 31.9 g/dL (ref 30.0–36.0)
MCV: 89.3 fL (ref 78.0–100.0)
PLATELETS: 243 10*3/uL (ref 150–400)
RBC: 3.83 MIL/uL — AB (ref 3.87–5.11)
RDW: 13.6 % (ref 11.5–15.5)
WBC: 8.3 10*3/uL (ref 4.0–10.5)

## 2016-07-27 LAB — HEPARIN LEVEL (UNFRACTIONATED)
HEPARIN UNFRACTIONATED: 0.86 [IU]/mL — AB (ref 0.30–0.70)
Heparin Unfractionated: 0.68 IU/mL (ref 0.30–0.70)
Heparin Unfractionated: 0.65 IU/mL (ref 0.30–0.70)

## 2016-07-27 LAB — BASIC METABOLIC PANEL
Anion gap: 7 (ref 5–15)
BUN: 23 mg/dL — ABNORMAL HIGH (ref 6–20)
CO2: 25 mmol/L (ref 22–32)
Calcium: 8.5 mg/dL — ABNORMAL LOW (ref 8.9–10.3)
Chloride: 106 mmol/L (ref 101–111)
Creatinine, Ser: 1.45 mg/dL — ABNORMAL HIGH (ref 0.44–1.00)
GFR calc Af Amer: 47 mL/min — ABNORMAL LOW (ref >60)
GFR calc non Af Amer: 40 mL/min — ABNORMAL LOW (ref >60)
Glucose, Bld: 277 mg/dL — ABNORMAL HIGH (ref 65–99)
Potassium: 4.2 mmol/L (ref 3.5–5.1)
Sodium: 138 mmol/L (ref 135–145)

## 2016-07-27 LAB — HEMOGLOBIN A1C
Hgb A1c MFr Bld: 9.2 % — ABNORMAL HIGH (ref 4.8–5.6)
Mean Plasma Glucose: 217 mg/dL

## 2016-07-27 LAB — ABO/RH: ABO/RH(D): B POS

## 2016-07-27 LAB — APTT: aPTT: 117 seconds — ABNORMAL HIGH (ref 24–36)

## 2016-07-27 MED ORDER — CHLORHEXIDINE GLUCONATE 4 % EX LIQD
60.0000 mL | Freq: Once | CUTANEOUS | Status: AC
  Administered 2016-07-27: 4 via TOPICAL
  Filled 2016-07-27: qty 60

## 2016-07-27 MED ORDER — TRANEXAMIC ACID (OHS) BOLUS VIA INFUSION
15.0000 mg/kg | INTRAVENOUS | Status: AC
  Administered 2016-07-28: 1585.5 mg via INTRAVENOUS
  Filled 2016-07-27 (×2): qty 1586

## 2016-07-27 MED ORDER — PLASMA-LYTE 148 IV SOLN
INTRAVENOUS | Status: AC
  Administered 2016-07-28: 500 mL
  Filled 2016-07-27 (×2): qty 2.5

## 2016-07-27 MED ORDER — SODIUM CHLORIDE 0.9 % IV SOLN
INTRAVENOUS | Status: AC
  Administered 2016-07-27: 1000 mL via INTRAVENOUS

## 2016-07-27 MED ORDER — SODIUM CHLORIDE 0.9 % IV SOLN
1.5000 mg/kg/h | INTRAVENOUS | Status: AC
  Administered 2016-07-28: 1.5 mg/kg/h via INTRAVENOUS
  Filled 2016-07-27 (×2): qty 25

## 2016-07-27 MED ORDER — DEXMEDETOMIDINE HCL IN NACL 400 MCG/100ML IV SOLN
0.1000 ug/kg/h | INTRAVENOUS | Status: AC
  Administered 2016-07-28: 14:00:00 via INTRAVENOUS
  Administered 2016-07-28: .3 ug/kg/h via INTRAVENOUS
  Filled 2016-07-27: qty 100

## 2016-07-27 MED ORDER — DIAZEPAM 5 MG PO TABS
5.0000 mg | ORAL_TABLET | Freq: Once | ORAL | Status: AC
  Administered 2016-07-28: 5 mg via ORAL
  Filled 2016-07-27: qty 1

## 2016-07-27 MED ORDER — SODIUM CHLORIDE 0.9 % IV SOLN
1500.0000 mg | INTRAVENOUS | Status: AC
  Administered 2016-07-28: 1500 mg via INTRAVENOUS
  Filled 2016-07-27 (×2): qty 1500

## 2016-07-27 MED ORDER — MAGNESIUM SULFATE 50 % IJ SOLN
40.0000 meq | INTRAMUSCULAR | Status: DC
  Filled 2016-07-27: qty 10

## 2016-07-27 MED ORDER — PHENYLEPHRINE HCL 10 MG/ML IJ SOLN
30.0000 ug/min | INTRAVENOUS | Status: AC
  Administered 2016-07-28: 20 ug/min via INTRAVENOUS
  Filled 2016-07-27 (×2): qty 2

## 2016-07-27 MED ORDER — ALPRAZOLAM 0.25 MG PO TABS
0.2500 mg | ORAL_TABLET | ORAL | Status: DC | PRN
  Administered 2016-07-27: 0.5 mg via ORAL
  Filled 2016-07-27: qty 2

## 2016-07-27 MED ORDER — CHLORHEXIDINE GLUCONATE 0.12 % MT SOLN
15.0000 mL | Freq: Once | OROMUCOSAL | Status: AC
  Administered 2016-07-28: 15 mL via OROMUCOSAL
  Filled 2016-07-27: qty 15

## 2016-07-27 MED ORDER — EPINEPHRINE PF 1 MG/ML IJ SOLN
0.0000 ug/min | INTRAVENOUS | Status: DC
  Filled 2016-07-27 (×2): qty 4

## 2016-07-27 MED ORDER — TECHNETIUM TC 99M DIETHYLENETRIAME-PENTAACETIC ACID: 31.9000 | Freq: Once | INTRAVENOUS | Status: DC | PRN

## 2016-07-27 MED ORDER — LEVOFLOXACIN IN D5W 500 MG/100ML IV SOLN
500.0000 mg | INTRAVENOUS | Status: AC
  Administered 2016-07-28: 500 mg via INTRAVENOUS
  Filled 2016-07-27 (×2): qty 100

## 2016-07-27 MED ORDER — DOPAMINE-DEXTROSE 3.2-5 MG/ML-% IV SOLN
0.0000 ug/kg/min | INTRAVENOUS | Status: DC
  Filled 2016-07-27 (×2): qty 250

## 2016-07-27 MED ORDER — BISACODYL 5 MG PO TBEC
5.0000 mg | DELAYED_RELEASE_TABLET | Freq: Once | ORAL | Status: AC
  Administered 2016-07-27: 5 mg via ORAL
  Filled 2016-07-27: qty 1

## 2016-07-27 MED ORDER — POTASSIUM CHLORIDE 2 MEQ/ML IV SOLN
80.0000 meq | INTRAVENOUS | Status: DC
  Filled 2016-07-27 (×2): qty 40

## 2016-07-27 MED ORDER — NITROGLYCERIN IN D5W 200-5 MCG/ML-% IV SOLN
2.0000 ug/min | INTRAVENOUS | Status: AC
  Administered 2016-07-28: 16.6 ug/min via INTRAVENOUS
  Filled 2016-07-27 (×2): qty 250

## 2016-07-27 MED ORDER — SODIUM CHLORIDE 0.9 % IV SOLN
INTRAVENOUS | Status: DC
  Filled 2016-07-27 (×2): qty 30

## 2016-07-27 MED ORDER — SODIUM CHLORIDE 0.9 % IV SOLN
INTRAVENOUS | Status: AC
  Administered 2016-07-28: 1 [IU]/h via INTRAVENOUS
  Filled 2016-07-27 (×2): qty 2.5

## 2016-07-27 MED ORDER — TEMAZEPAM 15 MG PO CAPS
15.0000 mg | ORAL_CAPSULE | Freq: Once | ORAL | Status: AC | PRN
  Administered 2016-07-28: 15 mg via ORAL
  Filled 2016-07-27: qty 1

## 2016-07-27 MED ORDER — METOPROLOL TARTRATE 12.5 MG HALF TABLET
12.5000 mg | ORAL_TABLET | Freq: Once | ORAL | Status: AC
  Administered 2016-07-28: 12.5 mg via ORAL
  Filled 2016-07-27: qty 1

## 2016-07-27 MED ORDER — TECHNETIUM TO 99M ALBUMIN AGGREGATED
4.2000 | Freq: Once | INTRAVENOUS | Status: AC | PRN
  Administered 2016-07-27: 4.2 via INTRAVENOUS

## 2016-07-27 MED ORDER — TRANEXAMIC ACID (OHS) PUMP PRIME SOLUTION
2.0000 mg/kg | INTRAVENOUS | Status: DC
  Filled 2016-07-27 (×2): qty 2.11

## 2016-07-27 MED ORDER — CHLORHEXIDINE GLUCONATE 4 % EX LIQD
60.0000 mL | Freq: Once | CUTANEOUS | Status: AC
  Administered 2016-07-28: 4 via TOPICAL
  Filled 2016-07-27: qty 60

## 2016-07-27 NOTE — Progress Notes (Signed)
2 Days Post-Op Procedure(s) (LRB): Left Heart Cath and Coronary Angiography (N/A) Subjective: Stable on iv heparin for unstable angina, Xarelto washout prior to CABG Creat 1.4 , V/Q scan pending to assess degree of current pulmonary mal perfusion after bilateral PE 9- 2017 Room air PO2 584 Objective: Vital signs in last 24 hours: Temp:  [97.7 F (36.5 C)-98.8 F (37.1 C)] 98.4 F (36.9 C) (11/16 1213) Pulse Rate:  [65-73] 65 (11/16 1213) Cardiac Rhythm: Normal sinus rhythm (11/16 0847) Resp:  [16-20] 18 (11/16 1213) BP: (123-131)/(59-70) 124/70 (11/16 1213) SpO2:  [94 %-98 %] 98 % (11/16 0017) Weight:  [233 lb 1.6 oz (105.7 kg)] 233 lb 1.6 oz (105.7 kg) (11/16 0438)  Hemodynamic parameters for last 24 hours:  nsr  Intake/Output from previous day: 11/15 0701 - 11/16 0700 In: 1215 [P.O.:900; I.V.:315] Out: 2000 [Urine:2000] Intake/Output this shift: No intake/output data recorded.       Exam    General- alert and comfortable   Lungs- clear without rales, wheezes   Cor- regular rate and rhythm, no murmur , gallop   Abdomen- soft, non-tender   Extremities - warm, non-tender, minimal edema   Neuro- oriented, appropriate, no focal weakness   Lab Results:  Recent Labs  07/26/16 0422 07/27/16 0420  WBC 8.3 8.3  HGB 10.7* 10.9*  HCT 33.5* 34.2*  PLT 247 243   BMET:  Recent Labs  07/26/16 0422 07/27/16 0420  NA 137 138  K 4.2 4.2  CL 104 106  CO2 24 25  GLUCOSE 144* 277*  BUN 16 23*  CREATININE 1.14* 1.45*  CALCIUM 8.6* 8.5*    PT/INR:  Recent Labs  07/26/16 0422  LABPROT 13.2  INR 1.00   ABG    Component Value Date/Time   PHART 7.377 07/25/2016 1520   HCO3 26.0 07/25/2016 1520   TCO2 22 01/13/2015 1713   O2SAT 86.0 07/25/2016 1520   CBG (last 3)   Recent Labs  07/27/16 0016 07/27/16 0739 07/27/16 1127  GLUCAP 181* 298* 212*    Assessment/Plan: S/P Procedure(s) (LRB): Left Heart Cath and Coronary Angiography (N/A) Plan CABG in  am Procedure and risks d/w patient who agrees with CABG   LOS: 3 days    Tharon Aquas Trigt III 07/27/2016

## 2016-07-27 NOTE — Progress Notes (Signed)
ANTICOAGULATION CONSULT NOTE - Follow Up Consult  Pharmacy Consult for Heparin  Indication: chest pain/ACS, Hx PE (Xarelto on hold)  Patient Measurements: Height: 5\' 4"  (162.6 cm) Weight: 233 lb 1.6 oz (105.7 kg) IBW/kg (Calculated) : 54.7  Adjusted body weight: 80 kg  Vital Signs: Temp: 98.6 F (37 C) (11/16 0438) Temp Source: Oral (11/16 0438) BP: 131/69 (11/16 0438) Pulse Rate: 69 (11/16 0438)  Labs:  Recent Labs  07/24/16 1950  07/25/16 0329 07/26/16 0422 07/27/16 0420  HGB  --   < > 10.4* 10.7* 10.9*  HCT  --   --  31.7* 33.5* 34.2*  PLT  --   --  235 247 243  APTT 72*  --   --  88* 117*  LABPROT  --   --   --  13.2  --   INR  --   --   --  1.00  --   HEPARINUNFRC  --   --  0.72* 0.70 0.86*  CREATININE  --   --  1.07* 1.14* 1.45*  < > = values in this interval not displayed.  Estimated Creatinine Clearance: 53.2 mL/min (by C-G formula based on SCr of 1.45 mg/dL (H)).   Assessment: 55 y/o female now on IV heparin infusion for h/o Bilateral PE diagnosed 05/17/16 and CAD, s/p cath 07/25/16= severe 3 vessel obstructive CAD, plan for CABG on Friday 11/17th. Patient was on Xarelto PTA for h/o PE --on hold..   Heparin level 0.86 this morning.    Goals of therapy:  Heparin level 0.3-0.7 units/ml      Plan:  1. Reduce heparin infusion to 1350 units/hr  2. Repeat heparin level in 6 hours   Vincenza Hews, PharmD, BCPS 07/27/2016, 5:55 AM Pager: [phone]

## 2016-07-27 NOTE — Progress Notes (Signed)
CARDIAC REHAB PHASE I   PRE:  Rate/Rhythm: 70 SR  BP:  Sitting: 137/84        SaO2: 95 RA  MODE:  Ambulation: 550 ft   POST:  Rate/Rhythm: 75 SR  BP:  Sitting: 139/75         SaO2: 98 RA  Pt ambulated 550 ft on RA, IV, rolling walker, assist x1, slow, steady gait, tolerated well with no complaints. Pt has watched surgery videos, requesting to watch preparing for cardiac surgery video again. Pt to recliner after walk, feet elevated, watching video, call bell within reach. Will follow post op.   [id number] Lenna Sciara, RN, BSN 07/27/2016 11:13 AM

## 2016-07-27 NOTE — Progress Notes (Signed)
ANTICOAGULATION CONSULT NOTE - Follow Up Consult  Pharmacy Consult for Heparin  Indication: chest pain/ACS, Hx PE (Xarelto on hold)  Patient Measurements: Height: 5\' 4"  (162.6 cm) Weight: 233 lb 1.6 oz (105.7 kg) IBW/kg (Calculated) : 54.7  Adjusted body weight: 80 kg  Vital Signs: Temp: 98.4 F (36.9 C) (11/16 1710) Temp Source: Oral (11/16 1710) BP: 119/64 (11/16 1710) Pulse Rate: 71 (11/16 1710)  Labs:  Recent Labs  07/24/16 1950  07/25/16 0329 07/26/16 0422 07/27/16 0420 07/27/16 1150 07/27/16 1753  HGB  --   < > 10.4* 10.7* 10.9*  --   --   HCT  --   --  31.7* 33.5* 34.2*  --   --   PLT  --   --  235 247 243  --   --   APTT 72*  --   --  88* 117*  --   --   LABPROT  --   --   --  13.2  --   --   --   INR  --   --   --  1.00  --   --   --   HEPARINUNFRC  --   < > 0.72* 0.70 0.86* 0.68 0.65  CREATININE  --   --  1.07* 1.14* 1.45*  --   --   < > = values in this interval not displayed.  Estimated Creatinine Clearance: 53.2 mL/min (by C-G formula based on SCr of 1.45 mg/dL (H)).   Assessment: 55 y/o female now on IV heparin awaiting for CABG 11/17, confirmatory heparin level 0.65, therapeutic on 1350 units/hr.   Goals of therapy:  Heparin level 0.3-0.7 units/ml      Plan:  Continue heparin infusion 1350 units/hr   D/c heparin on call to OR tomorrow AM  Thank you for allowing pharmacy to be part of this patients care team.  Maryanna Shape, PharmD, BCPS  Clinical Pharmacist  Pager: [phone]   07/27/2016, 6:54 PM

## 2016-07-27 NOTE — Anesthesia Preprocedure Evaluation (Addendum)
Anesthesia Evaluation  Patient identified by MRN, date of birth, ID band Patient awake    Reviewed: Allergy & Precautions, NPO status , Patient's Chart, lab work & pertinent test results, reviewed documented beta blocker date and time   History of Anesthesia Complications Negative for: history of anesthetic complications  Airway Mallampati: I  TM Distance: >3 FB Neck ROM: Full    Dental   Pulmonary former smoker, PE (Bilateral subsegmental PE)   Pulmonary exam normal        Cardiovascular hypertension, Pt. on medications and Pt. on home beta blockers + angina + CAD  Normal cardiovascular exam  Echo 07/25/16: Study Conclusions  - Left ventricle: The cavity size was normal. Wall thickness was increased in a pattern of mild LVH. Systolic function was normal. The estimated ejection fraction was in the range of 55% to 60%. Wall motion was normal; there were no regional wall motion abnormalities. Doppler parameters are consistent with abnormal left ventricular relaxation (grade 1 diastolic dysfunction). - Mitral valve: Calcified annulus. Mildly thickened leaflets .  Impressions:  - Normal LV systolic function; mild LVH; grade 1 diastolic   Dysfunction.  LHC 07/25/16: The left ventricular systolic function is normal. LV end diastolic pressure is normal. The left ventricular ejection fraction is 55-65% by visual estimate. Prox LAD to Mid LAD lesion, 85 %stenosed. Mid LAD to Dist LAD lesion, 30 %stenosed. Ost 1st Diag to 1st Diag lesion, 80 %stenosed. Ost 2nd Diag to 2nd Diag lesion, 50 %stenosed. Ost 1st Mrg to 1st Mrg lesion, 95 %stenosed. 2nd Mrg lesion, 70 %stenosed. Prox Cx to Mid Cx lesion, 50 %stenosed. Mid RCA lesion, 90 %stenosed.   1. Severe 3 vessel obstructive CAD 2. Normal LV function 3. Normal LV EDP   Neuro/Psych PSYCHIATRIC DISORDERS Depression  Neuromuscular disease (neuropathy)     GI/Hepatic Neg liver ROS, GERD  Medicated,  Endo/Other  diabetes, Type 2, Insulin DependentMorbid obesity  Renal/GU Renal InsufficiencyRenal disease     Musculoskeletal negative musculoskeletal ROS (+)   Abdominal   Peds  Hematology  (+) Blood dyscrasia (Xarelto), anemia ,   Anesthesia Other Findings Day of surgery medications reviewed with the patient.  Reproductive/Obstetrics                            Anesthesia Physical Anesthesia Plan  ASA: III  Anesthesia Plan: General   Post-op Pain Management:    Induction: Intravenous  Airway Management Planned: Oral ETT  Additional Equipment: Arterial line, TEE, CVP, PA Cath and Ultrasound Guidance Line Placement  Intra-op Plan:   Post-operative Plan: Post-operative intubation/ventilation  Informed Consent: I have reviewed the patients History and Physical, chart, labs and discussed the procedure including the risks, benefits and alternatives for the proposed anesthesia with the patient or authorized representative who has indicated his/her understanding and acceptance.   Dental advisory given  Plan Discussed with: CRNA and Surgeon  Anesthesia Plan Comments:        Anesthesia Quick Evaluation

## 2016-07-27 NOTE — Progress Notes (Signed)
[facility] PROGRESS NOTE  Tamara Henry [id number] DOB: 13-Dec-1960 DOA: 07/23/2016   PCP: Philis Fendt, MD     Assessment/Plan: Principal Problem:   Unstable angina ([place]) Active Problems:   DM type 2, uncontrolled, with renal complications ([place])   Hypertension   Bilateral pulmonary embolism (HCC)   Hyperlipidemia   Diabetic peripheral neuropathy (HCC)   Chest pain   Coronary artery disease involving native heart without angina pectoris   55 y.o.femalewith a PMH significant for morbid obesity, IDDM, HTN, dyslipidemia, coronary calcifications on CT, GERD, and recently diagnosed bilateral PEs who  presented to the ED on 07/23/16 with chest pain, troponin 0.04 x 3.. The pt has been in a limited stable of health over the past 2 months following her diagnosis of PE, notably ambulating with the assistance of a walker and requiring homecare nursing. As per cardiology chest pain consistent with unstable angina. Currently on a heparin drip, underwent cardiac cath 11/14 which showed multivessel disease and thoracic surgery was consulted and planned for CABG on 07/28/16..  Assessment and plan Unstable angina/chest pain/CAD Continue heparin drip  (last dose of Xarelto 11/11 morning around 10AM).    --Cardiology consulted,recent CT chest demonstrates coronary calcium in all 3 epicardial vessels.   Continue aspirin, statin, metoprolol, Cardiac cath revealed multivessel disease. Cardiothoracic surgery input appreciated and plan for CABG on 07/28/16. Due to elevated creatinine, started gentle IV fluids on 07/27/16.  Dyslipidemia-continue statin and Zetia.  Insulin-dependent Type 2 DM --Continue home dose of levemir, hemoglobin A1c 9.9 on 05/19/16 Continue to adjust insulin regimen. Mildly fluctuating. Will need better control of her DM as outpatient.  Diabetic neuropathy --Continue home dose of neurotin  CKD 3, baseline Creatinine around 1.5-1.6 , improved after IV  fluids --HOLD HCTZ --Creatinine has worsened today to 1.45 from 1.14. Gentle IV fluids and follow BMP in a.m.  History of depression --Continue Paxil, patient is requesting psyche consult , for stressors in her life . This has been requested and input pending (patient was not in her room most of the day yesterday and hence psychiatry plans to see her today).  HTN -increased metoprolol to 50mg  BID and continue amlodipine. Uncontrolled. Losartan 25 MG daily started 11/15. Monitor. Blood pressure better controlled.  Bilateral subsegmental PE -- On Xarelto as OP, on hold now due to impending CABG. Continue IV heparin drip per pharmacy. --Low index of suspicion for recurrent PE at this time.  Abnormal TFTs - TSH 0.197 (low), free T4: 0.76 (normal). Clinically euthyroid. Check free T3. Follow TFTs in 4-6 weeks.  Trichomoniasis - treated with Flagyl 2gm PO once. Sexually active with single partner. Denies symptoms.     DVT prophylaxsis heparin drip Code Status:  Full code Family Communication: Discussed in detail with patient's son at bedside. Disposition Plan: To be determined post CABG. Patient will likely transfer to ICU post procedure on Friday.      Consultants:  Cardiology  Cardiothoracic surgery  Procedures:  Cardiac cath 11/14  Antibiotics: Anti-infectives    Start     Dose/Rate Route Frequency Ordered Stop   07/28/16 0400  vancomycin (VANCOCIN) 1,500 mg in sodium chloride 0.9 % 250 mL IVPB     1,500 mg 125 mL/hr over 120 Minutes Intravenous To Surgery 07/27/16 1417 07/29/16 0400   07/28/16 0400  levofloxacin (LEVAQUIN) IVPB 500 mg     500 mg 100 mL/hr over 60 Minutes Intravenous To Surgery 07/27/16 1417 07/29/16 0400   07/26/16 1645  metroNIDAZOLE (FLAGYL) tablet 2,000 mg     2,000 mg Oral Once 07/26/16 1638 07/26/16 1729   07/24/16 1515  fluconazole (DIFLUCAN) tablet 100 mg     100 mg Oral  Once 07/24/16 1508 07/24/16 1744         HPI/Subjective: No  chest pain, dyspnea or palpitations reported. Denies suicidal or homicidal ideations. States that she does not feel depressed today.  Objective: Vitals:   07/27/16 0017 07/27/16 0438 07/27/16 0740 07/27/16 1213  BP: 123/63 131/69 131/65 124/70  Pulse: 71 69 66 65  Resp:   20 18  Temp: 98.5 F (36.9 C) 98.6 F (37 C) 98.8 F (37.1 C) 98.4 F (36.9 C)  TempSrc: Oral Oral Oral Oral  SpO2: 98%     Weight:  105.7 kg (233 lb 1.6 oz)    Height:        Intake/Output Summary (Last 24 hours) at 07/27/16 1704 Last data filed at 07/27/16 0400  Gross per 24 hour  Intake              795 ml  Output             1050 ml  Net             -255 ml    Exam:  Examination:  General exam: Appears calm and comfortable  Respiratory system: Clear to auscultation. Respiratory effort normal. Cardiovascular system: S1 & S2 heard, RRR. No JVD, murmurs, rubs, gallops or clicks. No pedal edema.Telemetry: Sinus rhythm. Gastrointestinal system: Abdomen is nondistended, soft and nontender. No organomegaly or masses felt. Normal bowel sounds heard. Central nervous system: Alert and oriented. No focal neurological deficits. Extremities: Symmetric 5 x 5 power. Skin: No rashes, lesions or ulcers Psychiatry: Judgement and insight appear normal. Mood & affect appropriate.     Data Reviewed: I have personally reviewed following labs and imaging studies  Micro Results Recent Results (from the past 240 hour(s))  MRSA PCR Screening     Status: None   Collection Time: 07/23/16  7:22 AM  Result Value Ref Range Status   MRSA by PCR NEGATIVE NEGATIVE Final    Comment:        The GeneXpert MRSA Assay (FDA approved for NASAL specimens only), is one component of a comprehensive MRSA colonization surveillance program. It is not intended to diagnose MRSA infection nor to guide or monitor treatment for MRSA infections.   Surgical pcr screen     Status: None   Collection Time: 07/26/16  5:30 AM  Result Value  Ref Range Status   MRSA, PCR NEGATIVE NEGATIVE Final   Staphylococcus aureus NEGATIVE NEGATIVE Final    Comment:        The Xpert SA Assay (FDA approved for NASAL specimens in patients over 61 years of age), is one component of a comprehensive surveillance program.  Test performance has been validated by [facility] for patients greater than or equal to 53 year old. It is not intended to diagnose infection nor to guide or monitor treatment.     Radiology Reports Dg Chest 2 View  Result Date: 07/27/2016 CLINICAL DATA:  55 year old female for preoperative evaluation prior to CABG tomorrow. EXAM: CHEST  2 VIEW COMPARISON:  07/23/2016 and prior chest radiographs FINDINGS: The cardiomediastinal silhouette is unremarkable. There is no evidence of focal airspace disease, pulmonary edema, suspicious pulmonary nodule/mass, pleural effusion, or pneumothorax. No acute bony abnormalities are identified. IMPRESSION: No active cardiopulmonary disease. Electronically Signed   By: Dellis Filbert  Hu M.D.   On: 07/27/2016 15:10   Dg Chest 2 View  Result Date: 07/23/2016 CLINICAL DATA:  55 y/o F; left-sided chest pain radiating down the left arm for 2 hours. Shortness of breath. EXAM: CHEST  2 VIEW COMPARISON:  05/17/2016 chest radiograph. FINDINGS: The heart size and mediastinal contours are within normal limits and stable. Both lungs are clear. The visualized skeletal structures are unremarkable. IMPRESSION: No active cardiopulmonary disease. Electronically Signed   By: Kristine Garbe M.D.   On: 07/23/2016 03:16   Nm Pulmonary Perf And Vent  Result Date: 07/27/2016 CLINICAL DATA:  55 y/o F; chest pain and history of pulmonary embolus. EXAM: NUCLEAR MEDICINE VENTILATION - PERFUSION LUNG SCAN TECHNIQUE: Ventilation images were obtained in multiple projections using inhaled aerosol Tc-30m DTPA. Perfusion images were obtained in multiple projections after intravenous injection of Tc-51m MAA.  RADIOPHARMACEUTICALS:  31.9 mCi Technetium-89m DTPA aerosol inhalation and 4.2 mCi Technetium-43m MAA IV COMPARISON:  07/27/2016 chest radiograph. 05/17/2016 CT angiogram of the chest. FINDINGS: Ventilation: Right lower lobe superior segment large and left lower lobe basilar segment small and moderate ventilation defects. Perfusion: Perfusion defects in the lower lobes bilaterally match ventilation defects. No mismatch defect identified. IMPRESSION: No mismatched ventilation/perfusion defect identified to suggest acute pulmonary embolus. Several matched ventilation/perfusion defects in the lower lobes corresponding to emboli on the 05/17/2016 CT angiogram of chest are compatible with chronic pulmonary embolism. Electronically Signed   By: Kristine Garbe M.D.   On: 07/27/2016 15:27     CBC  Recent Labs Lab 07/23/16 0913 07/24/16 0519 07/25/16 0329 07/26/16 0422 07/27/16 0420  WBC 7.9 8.4 10.4 8.3 8.3  HGB 10.4* 11.1* 10.4* 10.7* 10.9*  HCT 32.8* 33.5* 31.7* 33.5* 34.2*  PLT 215 222 235 247 243  MCV 91.1 88.6 89.0 89.1 89.3  MCH 28.9 29.4 29.2 28.5 28.5  MCHC 31.7 33.1 32.8 31.9 31.9  RDW 13.3 13.9 13.9 13.6 13.6    Chemistries   Recent Labs Lab 07/23/16 0250 07/23/16 0913 07/24/16 0519 07/25/16 0329 07/26/16 0422 07/27/16 0420  NA 137  --  136 138 137 138  K 4.1  --  4.6 5.2* 4.2 4.2  CL 106  --  105 109 104 106  CO2 22  --  19* 21* 24 25  GLUCOSE 331*  --  304* 145* 144* 277*  BUN 29*  --  27* 23* 16 23*  CREATININE 1.66*  --  1.19* 1.07* 1.14* 1.45*  CALCIUM 8.3*  --  8.6* 8.4* 8.6* 8.5*  AST  --  18 27 32 18  --   ALT  --  13* 15 14 13*  --   ALKPHOS  --  112 124 93 99  --   BILITOT  --  0.4 0.8 1.4* 0.4  --    ------------------------------------------------------------------------------------------------------------------ estimated creatinine clearance is 53.2 mL/min (by C-G formula based on SCr of 1.45 mg/dL  (H)). ------------------------------------------------------------------------------------------------------------------  Recent Labs  07/26/16 0422  HGBA1C 9.2*   ------------------------------------------------------------------------------------------------------------------ No results for input(s): CHOL, HDL, LDLCALC, TRIG, CHOLHDL, LDLDIRECT in the last 72 hours. ------------------------------------------------------------------------------------------------------------------  Recent Labs  07/26/16 0422  TSH 0.197*   ------------------------------------------------------------------------------------------------------------------ No results for input(s): VITAMINB12, FOLATE, FERRITIN, TIBC, IRON, RETICCTPCT in the last 72 hours.  Coagulation profile  Recent Labs Lab 07/23/16 0913 07/26/16 0422  INR 1.57 1.00    No results for input(s): DDIMER in the last 72 hours.  Cardiac Enzymes  Recent Labs Lab 07/23/16 0519 07/23/16 0913 07/23/16 1403  TROPONINI 0.04*  0.04* 0.04*   ------------------------------------------------------------------------------------------------------------------ Invalid input(s): POCBNP   CBG:  Recent Labs Lab 07/26/16 1620 07/26/16 2003 07/27/16 0016 07/27/16 0739 07/27/16 1127  GLUCAP 168* 183* 181* 298* 212*       Studies: Dg Chest 2 View  Result Date: 07/27/2016 CLINICAL DATA:  55 year old female for preoperative evaluation prior to CABG tomorrow. EXAM: CHEST  2 VIEW COMPARISON:  07/23/2016 and prior chest radiographs FINDINGS: The cardiomediastinal silhouette is unremarkable. There is no evidence of focal airspace disease, pulmonary edema, suspicious pulmonary nodule/mass, pleural effusion, or pneumothorax. No acute bony abnormalities are identified. IMPRESSION: No active cardiopulmonary disease. Electronically Signed   By: Margarette Canada M.D.   On: 07/27/2016 15:10   Nm Pulmonary Perf And Vent  Result Date:  07/27/2016 CLINICAL DATA:  55 y/o F; chest pain and history of pulmonary embolus. EXAM: NUCLEAR MEDICINE VENTILATION - PERFUSION LUNG SCAN TECHNIQUE: Ventilation images were obtained in multiple projections using inhaled aerosol Tc-59m DTPA. Perfusion images were obtained in multiple projections after intravenous injection of Tc-30m MAA. RADIOPHARMACEUTICALS:  31.9 mCi Technetium-28m DTPA aerosol inhalation and 4.2 mCi Technetium-51m MAA IV COMPARISON:  07/27/2016 chest radiograph. 05/17/2016 CT angiogram of the chest. FINDINGS: Ventilation: Right lower lobe superior segment large and left lower lobe basilar segment small and moderate ventilation defects. Perfusion: Perfusion defects in the lower lobes bilaterally match ventilation defects. No mismatch defect identified. IMPRESSION: No mismatched ventilation/perfusion defect identified to suggest acute pulmonary embolus. Several matched ventilation/perfusion defects in the lower lobes corresponding to emboli on the 05/17/2016 CT angiogram of chest are compatible with chronic pulmonary embolism. Electronically Signed   By: Kristine Garbe M.D.   On: 07/27/2016 15:27      Lab Results  Component Value Date   HGBA1C 9.2 (H) 07/26/2016   HGBA1C 9.9 (H) 05/19/2016   HGBA1C 10.3 (H) 01/14/2015   Lab Results  Component Value Date   LDLCALC 169 (H) 07/23/2016   CREATININE 1.45 (H) 07/27/2016       Scheduled Meds: . amLODipine  10 mg Oral Daily  . aspirin EC  81 mg Oral Daily  . atorvastatin  80 mg Oral q1800  . bisacodyl  5 mg Oral Once  . chlorhexidine  60 mL Topical Once   And  . [START ON 07/28/2016] chlorhexidine  60 mL Topical Once  . [START ON 07/28/2016] chlorhexidine  15 mL Mouth/Throat Once  . [START ON 07/28/2016] dexmedetomidine  0.1-0.7 mcg/kg/hr Intravenous To OR  . [START ON 07/28/2016] diazepam  5 mg Oral Once  . [START ON 07/28/2016] DOPamine  0-10 mcg/kg/min Intravenous To OR  . [START ON 07/28/2016] epinephrine  0-10  mcg/min Intravenous To OR  . gabapentin  400 mg Oral TID  . [START ON 07/28/2016] heparin-papaverine-plasmalyte irrigation   Irrigation To OR  . [START ON 07/28/2016] heparin 30,000 units/NS 1000 mL solution for CELLSAVER   Other To OR  . Influenza vac split quadrivalent PF  0.5 mL Intramuscular Tomorrow-1000  . insulin aspart  0-5 Units Subcutaneous QHS  . insulin aspart  0-9 Units Subcutaneous TID WC  . insulin glargine  15 Units Subcutaneous QHS  . [START ON 07/28/2016] insulin (NOVOLIN-R) infusion   Intravenous To OR  . [START ON 07/28/2016] levofloxacin (LEVAQUIN) IV  500 mg Intravenous To OR  . [START ON 07/28/2016] magnesium sulfate  40 mEq Other To OR  . metoprolol tartrate  50 mg Oral BID  . [START ON 07/28/2016] metoprolol tartrate  12.5 mg Oral Once  . nitroGLYCERIN  1  inch Topical Q6H  . [START ON 07/28/2016] nitroGLYCERIN  2-200 mcg/min Intravenous To OR  . PARoxetine  40 mg Oral Q breakfast  . [START ON 07/28/2016] phenylephrine (NEO-SYNEPHRINE) Adult infusion  30-200 mcg/min Intravenous To OR  . [START ON 07/28/2016] potassium chloride  80 mEq Other To OR  . sodium chloride flush  3 mL Intravenous Q12H  . [START ON 07/28/2016] tranexamic acid (CYKLOKAPRON) infusion ([facility])  1.5 mg/kg/hr Intravenous To OR  . [START ON 07/28/2016] tranexamic acid  15 mg/kg Intravenous To OR  . [START ON 07/28/2016] tranexamic acid  2 mg/kg Intracatheter To OR  . [START ON 07/28/2016] vancomycin  1,500 mg Intravenous To OR   Continuous Infusions: . sodium chloride 1,000 mL (07/27/16 1005)  . heparin 1,350 Units/hr (07/27/16 0700)     LOS: 3 days    Enmanuel Zufall, MD, FACP, FHM. [facility] Pager [phone]  If 7PM-7AM, please contact night-coverage [email or web address] Password [email or web address] 07/27/2016, 5:04 PM

## 2016-07-27 NOTE — Progress Notes (Signed)
Patient Name: Tamara Henry Date of Encounter: 07/27/2016  Primary Cardiologist: Dr. Sundra Aland Problem List     Principal Problem:   Unstable angina [facility]) Active Problems:   DM type 2, uncontrolled, with renal complications ([place])   Hypertension   Bilateral pulmonary embolism (HCC)   Hyperlipidemia   Diabetic peripheral neuropathy (HCC)   Chest pain   Coronary artery disease involving native heart without angina pectoris     Subjective   Feels well, denies chest pain and SOB  Inpatient Medications    Scheduled Meds: . amLODipine  10 mg Oral Daily  . aspirin EC  81 mg Oral Daily  . atorvastatin  80 mg Oral q1800  . gabapentin  400 mg Oral TID  . Influenza vac split quadrivalent PF  0.5 mL Intramuscular Tomorrow-1000  . insulin aspart  0-5 Units Subcutaneous QHS  . insulin aspart  0-9 Units Subcutaneous TID WC  . insulin glargine  15 Units Subcutaneous QHS  . losartan  25 mg Oral Daily  . metoprolol tartrate  50 mg Oral BID  . nitroGLYCERIN  1 inch Topical Q6H  . PARoxetine  40 mg Oral Q breakfast  . sodium chloride flush  3 mL Intravenous Q12H   Continuous Infusions: . heparin 1,350 Units/hr (07/27/16 0700)   PRN Meds: sodium chloride, acetaminophen, diphenhydrAMINE, morphine injection, nitroGLYCERIN, ondansetron (ZOFRAN) IV, oxyCODONE-acetaminophen, sodium chloride flush, zolpidem   Vital Signs    Vitals:   07/26/16 2001 07/27/16 0017 07/27/16 0438 07/27/16 0740  BP: (!) 130/59 123/63 131/69 131/65  Pulse: 73 71 69 66  Resp:    20  Temp: 97.7 F (36.5 C) 98.5 F (36.9 C) 98.6 F (37 C) 98.8 F (37.1 C)  TempSrc: Oral Oral Oral Oral  SpO2: 97% 98%    Weight:   233 lb 1.6 oz (105.7 kg)   Height:        Intake/Output Summary (Last 24 hours) at 07/27/16 0904 Last data filed at 07/27/16 0400  Gross per 24 hour  Intake             1215 ml  Output             2000 ml  Net             -785 ml   Filed Weights   07/25/16 0439 07/26/16 0455  07/27/16 0438  Weight: 235 lb 1.6 oz (106.6 kg) 235 lb 9.6 oz (106.9 kg) 233 lb 1.6 oz (105.7 kg)    Physical Exam   GEN: Well nourished, well developed, in no acute distress.  HEENT: Grossly normal.  Neck: Supple, no JVD, carotid bruits, or masses. Cardiac: RRR, no murmurs, rubs, or gallops. No clubbing, cyanosis, edema.  Radials/DP/PT 2+ and equal bilaterally.  Respiratory:  Respirations regular and unlabored, clear to auscultation bilaterally. GI: Soft, nontender, nondistended, BS + x 4. MS: no deformity or atrophy. Skin: warm and dry, no rash. Neuro:  Strength and sensation are intact. Psych: AAOx3.  Normal affect.  Labs    CBC  Recent Labs  07/26/16 0422 07/27/16 0420  WBC 8.3 8.3  HGB 10.7* 10.9*  HCT 33.5* 34.2*  MCV 89.1 89.3  PLT 247 0000000   Basic Metabolic Panel  Recent Labs  07/26/16 0422 07/27/16 0420  NA 137 138  K 4.2 4.2  CL 104 106  CO2 24 25  GLUCOSE 144* 277*  BUN 16 23*  CREATININE 1.14* 1.45*  CALCIUM 8.6* 8.5*   Liver Function  Tests  Recent Labs  07/25/16 0329 07/26/16 0422  AST 32 18  ALT 14 13*  ALKPHOS 93 99  BILITOT 1.4* 0.4  PROT 6.0* 6.3*  ALBUMIN 2.5* 2.5*   Hemoglobin A1C  Recent Labs  07/26/16 0422  HGBA1C 9.2*   Thyroid Function Tests  Recent Labs  07/26/16 0422  TSH 0.197*    Telemetry    NSR- Personally Reviewed  ECG    NSR with inferior ST depression- Personally Reviewed  Radiology    No results found.  Cardiac Studies   Left Heart Cath and Coronary Angiography 07/25/16  The left ventricular systolic function is normal.  LV end diastolic pressure is normal.  The left ventricular ejection fraction is 55-65% by visual estimate.  Prox LAD to Mid LAD lesion, 85 %stenosed.  Mid LAD to Dist LAD lesion, 30 %stenosed.  Ost 1st Diag to 1st Diag lesion, 80 %stenosed.  Ost 2nd Diag to 2nd Diag lesion, 50 %stenosed.  Ost 1st Mrg to 1st Mrg lesion, 95 %stenosed.  2nd Mrg lesion, 70  %stenosed.  Prox Cx to Mid Cx lesion, 50 %stenosed.  Mid RCA lesion, 90 %stenosed.  1. Severe 3 vessel obstructive CAD 2. Normal LV function 3. Normal LV EDP  Diagnostic Diagram        Patient Profile     Tamara Henry is a 55 year old female with a past medical history of obesity, DM, HTN, HLD, coronary calcifications on CT, GERD and recently diagnosed bilateral PE's. She presented to the ED on 07/23/16 with chest pain, troponin 0.04 x 3. Cath done, needs CABG for multi-vessel disease.   Assessment & Plan    1. CAD: Cath reveals multi-vessel disease, plan for CABG on Friday morning. Plan for bypass grafts to the LAD, diagonal, OM, and RCA.   Will continue heparin gtt.   2. HTN: Well controlled with the addition of losartan, however creatinine increased from 1.14 to 1.45.   3. HLD: Started on atorvastatin 80mg  based on AHA/ACC guidelines. Continue ezetimibe.   4. Poorly controlled IDDM: last A1c was 9.9. On sliding scale   5. History of bilateral PE: On Xarelto as an outpatient. On hold now.   Signed, Arbutus Leas, NP  07/27/2016, 9:04 AM

## 2016-07-27 NOTE — Progress Notes (Signed)
Inpatient Diabetes Program Recommendations  AACE/ADA: New Consensus Statement on Inpatient Glycemic Control (2015)  Target Ranges:  Prepandial:   less than 140 mg/dL      Peak postprandial:   less than 180 mg/dL (1-2 hours)      Critically ill patients:  140 - 180 mg/dL   Results for Tamara Henry, Tamara Henry (MRN [id number]) as of 07/27/2016 10:38  Ref. Range 07/26/2016 07:47 07/26/2016 11:59 07/26/2016 16:20 07/26/2016 20:03 07/27/2016 00:16 07/27/2016 07:39  Glucose-Capillary Latest Ref Range: 65 - 99 mg/dL 137 (H) 177 (H) 168 (H) 183 (H) 181 (H) 298 (H)   Review of Glycemic Control  Diabetes history: DM2 Outpatient Diabetes medications: Lantus 15 units BID, Novolog 5 units TID with meals Current orders for Inpatient glycemic control: Lantus 15 units QHS, Novolog 0-9 units TID with meals, Novolog 0-5 units QHS  Inpatient Diabetes Program Recommendations:  Insulin - Basal: Please consider increasing Lantus to 18 units QHS. Insulin - Meal Coverage: Please consider ordering Novolog 3 units TID with meals for meal coverage if patient eats at least 50% of meal (in addition to Novolog correction scale).  Thanks, Barnie Alderman, RN, MSN, CDE Diabetes Coordinator Inpatient Diabetes Program [phone] (Team Pager from 8am to 5pm)

## 2016-07-27 NOTE — Progress Notes (Signed)
ANTICOAGULATION CONSULT NOTE - Follow Up Consult  Pharmacy Consult for Heparin  Indication: chest pain/ACS, Hx PE (Xarelto on hold)  Patient Measurements: Height: 5\' 4"  (162.6 cm) Weight: 233 lb 1.6 oz (105.7 kg) IBW/kg (Calculated) : 54.7  Adjusted body weight: 80 kg  Vital Signs: Temp: 98.4 F (36.9 C) (11/16 1213) Temp Source: Oral (11/16 1213) BP: 124/70 (11/16 1213) Pulse Rate: 65 (11/16 1213)  Labs:  Recent Labs  07/24/16 1950  07/25/16 0329 07/26/16 0422 07/27/16 0420 07/27/16 1150  HGB  --   < > 10.4* 10.7* 10.9*  --   HCT  --   --  31.7* 33.5* 34.2*  --   PLT  --   --  235 247 243  --   APTT 72*  --   --  88* 117*  --   LABPROT  --   --   --  13.2  --   --   INR  --   --   --  1.00  --   --   HEPARINUNFRC  --   < > 0.72* 0.70 0.86* 0.68  CREATININE  --   --  1.07* 1.14* 1.45*  --   < > = values in this interval not displayed.  Estimated Creatinine Clearance: 53.2 mL/min (by C-G formula based on SCr of 1.45 mg/dL (H)).   Assessment: 55 y/o female now on IV heparin infusion for h/o Bilateral PE diagnosed 05/17/16 and CAD.  Patient was on Xarelto PTA for h/o PE --on hold. s/p Cath 07/25/16: severe 3 vessel obstructive CAD, plan for CABG on Friday 11/17th.  Heparin level = 0.68 after heparin IV rate decreased to 1350 unit/hr.  CBC stable with H/H low/stable 10.9/34.2 and pltc wnl.  MD notes pt has not had any further CP on IV heparin gtt.    Goals of therapy:  Heparin level 0.3-0.7 units/ml      Plan:  Continue heparin infusion to 1350 units/hr   Recheck heparin level in 6 hours to confirm therapeutic  Plan for CABG tomorrow  Thank you for allowing pharmacy to be part of this patients care team.   Nicole Cella, [place] Clinical Pharmacist Pager: [phone] or [phone] (330p-1030p) Main Rx [phone] 07/27/2016, 2:01 PM

## 2016-07-28 ENCOUNTER — Inpatient Hospital Stay (HOSPITAL_COMMUNITY): Payer: Medicare Other | Admitting: Anesthesiology

## 2016-07-28 ENCOUNTER — Inpatient Hospital Stay (HOSPITAL_COMMUNITY): Payer: Medicare Other

## 2016-07-28 ENCOUNTER — Encounter (HOSPITAL_COMMUNITY)
Admission: EM | Disposition: A | Discharge status: Home Health | Payer: Self-pay | Source: Home / Self Care | Attending: Cardiothoracic Surgery

## 2016-07-28 DIAGNOSIS — Z951 Presence of aortocoronary bypass graft: Secondary | ICD-10-CM

## 2016-07-28 HISTORY — PX: TEE WITHOUT CARDIOVERSION: SHX5443

## 2016-07-28 HISTORY — PX: CORONARY ARTERY BYPASS GRAFT: SHX141

## 2016-07-28 LAB — POCT I-STAT, CHEM 8
BUN: 25 mg/dL — ABNORMAL HIGH (ref 6–20)
BUN: 20 mg/dL (ref 6–20)
BUN: 24 mg/dL — ABNORMAL HIGH (ref 6–20)
BUN: 21 mg/dL — AB (ref 6–20)
BUN: 20 mg/dL (ref 6–20)
BUN: 20 mg/dL (ref 6–20)
BUN: 19 mg/dL (ref 6–20)
CALCIUM ION: 0.98 mmol/L — AB (ref 1.15–1.40)
CALCIUM ION: 1.02 mmol/L — AB (ref 1.15–1.40)
CALCIUM ION: 1.07 mmol/L — AB (ref 1.15–1.40)
CHLORIDE: 102 mmol/L (ref 101–111)
CHLORIDE: 105 mmol/L (ref 101–111)
CREATININE: 0.9 mg/dL (ref 0.44–1.00)
Calcium, Ion: 1.16 mmol/L (ref 1.15–1.40)
Calcium, Ion: 0.99 mmol/L — ABNORMAL LOW (ref 1.15–1.40)
Calcium, Ion: 1.23 mmol/L (ref 1.15–1.40)
Calcium, Ion: 1.02 mmol/L — ABNORMAL LOW (ref 1.15–1.40)
Chloride: 106 mmol/L (ref 101–111)
Chloride: 101 mmol/L (ref 101–111)
Chloride: 105 mmol/L (ref 101–111)
Chloride: 103 mmol/L (ref 101–111)
Chloride: 103 mmol/L (ref 101–111)
Creatinine, Ser: 1.1 mg/dL — ABNORMAL HIGH (ref 0.44–1.00)
Creatinine, Ser: 0.7 mg/dL (ref 0.44–1.00)
Creatinine, Ser: 1 mg/dL (ref 0.44–1.00)
Creatinine, Ser: 0.9 mg/dL (ref 0.44–1.00)
Creatinine, Ser: 0.9 mg/dL (ref 0.44–1.00)
Creatinine, Ser: 1.2 mg/dL — ABNORMAL HIGH (ref 0.44–1.00)
GLUCOSE: 185 mg/dL — AB (ref 65–99)
Glucose, Bld: 227 mg/dL — ABNORMAL HIGH (ref 65–99)
Glucose, Bld: 206 mg/dL — ABNORMAL HIGH (ref 65–99)
Glucose, Bld: 237 mg/dL — ABNORMAL HIGH (ref 65–99)
Glucose, Bld: 183 mg/dL — ABNORMAL HIGH (ref 65–99)
Glucose, Bld: 173 mg/dL — ABNORMAL HIGH (ref 65–99)
Glucose, Bld: 111 mg/dL — ABNORMAL HIGH (ref 65–99)
HCT: 32 % — ABNORMAL LOW (ref 36.0–46.0)
HCT: 26 % — ABNORMAL LOW (ref 36.0–46.0)
HCT: 30 % — ABNORMAL LOW (ref 36.0–46.0)
HCT: 21 % — ABNORMAL LOW (ref 36.0–46.0)
HCT: 23 % — ABNORMAL LOW (ref 36.0–46.0)
HCT: 23 % — ABNORMAL LOW (ref 36.0–46.0)
HEMATOCRIT: 24 % — AB (ref 36.0–46.0)
HEMOGLOBIN: 7.8 g/dL — AB (ref 12.0–15.0)
Hemoglobin: 10.9 g/dL — ABNORMAL LOW (ref 12.0–15.0)
Hemoglobin: 10.2 g/dL — ABNORMAL LOW (ref 12.0–15.0)
Hemoglobin: 8.8 g/dL — ABNORMAL LOW (ref 12.0–15.0)
Hemoglobin: 7.1 g/dL — ABNORMAL LOW (ref 12.0–15.0)
Hemoglobin: 7.8 g/dL — ABNORMAL LOW (ref 12.0–15.0)
Hemoglobin: 8.2 g/dL — ABNORMAL LOW (ref 12.0–15.0)
POTASSIUM: 4 mmol/L (ref 3.5–5.1)
Potassium: 4.7 mmol/L (ref 3.5–5.1)
Potassium: 4.3 mmol/L (ref 3.5–5.1)
Potassium: 4.4 mmol/L (ref 3.5–5.1)
Potassium: 4.9 mmol/L (ref 3.5–5.1)
Potassium: 5 mmol/L (ref 3.5–5.1)
Potassium: 5 mmol/L (ref 3.5–5.1)
SODIUM: 141 mmol/L (ref 135–145)
Sodium: 138 mmol/L (ref 135–145)
Sodium: 138 mmol/L (ref 135–145)
Sodium: 138 mmol/L (ref 135–145)
Sodium: 138 mmol/L (ref 135–145)
Sodium: 138 mmol/L (ref 135–145)
Sodium: 137 mmol/L (ref 135–145)
TCO2: 24 mmol/L (ref 0–100)
TCO2: 24 mmol/L (ref 0–100)
TCO2: 24 mmol/L (ref 0–100)
TCO2: 24 mmol/L (ref 0–100)
TCO2: 24 mmol/L (ref 0–100)
TCO2: 23 mmol/L (ref 0–100)
TCO2: 24 mmol/L (ref 0–100)

## 2016-07-28 LAB — POCT I-STAT 3, ART BLOOD GAS (G3+)
ACID-BASE DEFICIT: 7 mmol/L — AB (ref 0.0–2.0)
ACID-BASE EXCESS: 1 mmol/L (ref 0.0–2.0)
Acid-base deficit: 1 mmol/L (ref 0.0–2.0)
Acid-base deficit: 2 mmol/L (ref 0.0–2.0)
Acid-base deficit: 2 mmol/L (ref 0.0–2.0)
BICARBONATE: 25.2 mmol/L (ref 20.0–28.0)
BICARBONATE: 27 mmol/L (ref 20.0–28.0)
BICARBONATE: 25 mmol/L (ref 20.0–28.0)
BICARBONATE: 22.8 mmol/L (ref 20.0–28.0)
BICARBONATE: 22.8 mmol/L (ref 20.0–28.0)
Bicarbonate: 19.6 mmol/L — ABNORMAL LOW (ref 20.0–28.0)
O2 SAT: 100 %
O2 SAT: 83 %
O2 SAT: 90 %
O2 Saturation: 96 %
O2 Saturation: 99 %
O2 Saturation: 90 %
PCO2 ART: 45.3 mmHg (ref 32.0–48.0)
PCO2 ART: 44.1 mmHg (ref 32.0–48.0)
PCO2 ART: 38.1 mmHg (ref 32.0–48.0)
PH ART: 7.352 (ref 7.350–7.450)
PH ART: 7.274 — AB (ref 7.350–7.450)
PH ART: 7.382 (ref 7.350–7.450)
PH ART: 7.36 (ref 7.350–7.450)
PO2 ART: 62 mmHg — AB (ref 83.0–108.0)
TCO2: 27 mmol/L (ref 0–100)
TCO2: 21 mmol/L (ref 0–100)
TCO2: 28 mmol/L (ref 0–100)
TCO2: 26 mmol/L (ref 0–100)
TCO2: 24 mmol/L (ref 0–100)
TCO2: 24 mmol/L (ref 0–100)
pCO2 arterial: 42.4 mmHg (ref 32.0–48.0)
pCO2 arterial: 44.7 mmHg (ref 32.0–48.0)
pCO2 arterial: 37.3 mmHg (ref 32.0–48.0)
pH, Arterial: 7.392 (ref 7.350–7.450)
pH, Arterial: 7.382 (ref 7.350–7.450)
pO2, Arterial: 309 mmHg — ABNORMAL HIGH (ref 83.0–108.0)
pO2, Arterial: 91 mmHg (ref 83.0–108.0)
pO2, Arterial: 155 mmHg — ABNORMAL HIGH (ref 83.0–108.0)
pO2, Arterial: 46 mmHg — ABNORMAL LOW (ref 83.0–108.0)
pO2, Arterial: 58 mmHg — ABNORMAL LOW (ref 83.0–108.0)

## 2016-07-28 LAB — PLATELET COUNT: Platelets: 122 10*3/uL — ABNORMAL LOW (ref 150–400)

## 2016-07-28 LAB — BASIC METABOLIC PANEL
Anion gap: 6 (ref 5–15)
BUN: 24 mg/dL — ABNORMAL HIGH (ref 6–20)
CO2: 25 mmol/L (ref 22–32)
Calcium: 8.2 mg/dL — ABNORMAL LOW (ref 8.9–10.3)
Chloride: 106 mmol/L (ref 101–111)
Creatinine, Ser: 1.26 mg/dL — ABNORMAL HIGH (ref 0.44–1.00)
GFR calc Af Amer: 55 mL/min — ABNORMAL LOW (ref >60)
GFR calc non Af Amer: 48 mL/min — ABNORMAL LOW (ref >60)
Glucose, Bld: 241 mg/dL — ABNORMAL HIGH (ref 65–99)
Potassium: 4.2 mmol/L (ref 3.5–5.1)
Sodium: 137 mmol/L (ref 135–145)

## 2016-07-28 LAB — POCT I-STAT 4, (NA,K, GLUC, HGB,HCT)
GLUCOSE: 129 mg/dL — AB (ref 65–99)
HCT: 27 % — ABNORMAL LOW (ref 36.0–46.0)
Hemoglobin: 9.2 g/dL — ABNORMAL LOW (ref 12.0–15.0)
POTASSIUM: 4.1 mmol/L (ref 3.5–5.1)
SODIUM: 142 mmol/L (ref 135–145)

## 2016-07-28 LAB — GLUCOSE, CAPILLARY
GLUCOSE-CAPILLARY: 101 mg/dL — AB (ref 65–99)
GLUCOSE-CAPILLARY: 115 mg/dL — AB (ref 65–99)
GLUCOSE-CAPILLARY: 129 mg/dL — AB (ref 65–99)
GLUCOSE-CAPILLARY: 131 mg/dL — AB (ref 65–99)
GLUCOSE-CAPILLARY: 143 mg/dL — AB (ref 65–99)
GLUCOSE-CAPILLARY: 210 mg/dL — AB (ref 65–99)
GLUCOSE-CAPILLARY: 219 mg/dL — AB (ref 65–99)
Glucose-Capillary: 121 mg/dL — ABNORMAL HIGH (ref 65–99)
Glucose-Capillary: 108 mg/dL — ABNORMAL HIGH (ref 65–99)
Glucose-Capillary: 115 mg/dL — ABNORMAL HIGH (ref 65–99)
Glucose-Capillary: 105 mg/dL — ABNORMAL HIGH (ref 65–99)

## 2016-07-28 LAB — CBC
HCT: 27 % — ABNORMAL LOW (ref 36.0–46.0)
HCT: 25.2 % — ABNORMAL LOW (ref 36.0–46.0)
HEMATOCRIT: 31.7 % — AB (ref 36.0–46.0)
HEMOGLOBIN: 8.7 g/dL — AB (ref 12.0–15.0)
Hemoglobin: 10.3 g/dL — ABNORMAL LOW (ref 12.0–15.0)
Hemoglobin: 8.1 g/dL — ABNORMAL LOW (ref 12.0–15.0)
MCH: 29.2 pg (ref 26.0–34.0)
MCH: 28.5 pg (ref 26.0–34.0)
MCH: 28.3 pg (ref 26.0–34.0)
MCHC: 32.5 g/dL (ref 30.0–36.0)
MCHC: 32.2 g/dL (ref 30.0–36.0)
MCHC: 32.1 g/dL (ref 30.0–36.0)
MCV: 89.8 fL (ref 78.0–100.0)
MCV: 88.5 fL (ref 78.0–100.0)
MCV: 88.1 fL (ref 78.0–100.0)
PLATELETS: 236 10*3/uL (ref 150–400)
PLATELETS: 171 10*3/uL (ref 150–400)
Platelets: 160 10*3/uL (ref 150–400)
RBC: 3.53 MIL/uL — AB (ref 3.87–5.11)
RBC: 3.05 MIL/uL — AB (ref 3.87–5.11)
RBC: 2.86 MIL/uL — ABNORMAL LOW (ref 3.87–5.11)
RDW: 13.7 % (ref 11.5–15.5)
RDW: 13.5 % (ref 11.5–15.5)
RDW: 13.5 % (ref 11.5–15.5)
WBC: 9.2 10*3/uL (ref 4.0–10.5)
WBC: 12.7 10*3/uL — AB (ref 4.0–10.5)
WBC: 11.2 10*3/uL — ABNORMAL HIGH (ref 4.0–10.5)

## 2016-07-28 LAB — CREATININE, SERUM
Creatinine, Ser: 1.16 mg/dL — ABNORMAL HIGH (ref 0.44–1.00)
GFR calc Af Amer: >60 mL/min (ref >60)
GFR calc non Af Amer: 53 mL/min — ABNORMAL LOW (ref >60)

## 2016-07-28 LAB — HEMOGLOBIN AND HEMATOCRIT, BLOOD
HCT: 20.3 % — ABNORMAL LOW (ref 36.0–46.0)
Hemoglobin: 6.8 g/dL — CL (ref 12.0–15.0)

## 2016-07-28 LAB — PROTIME-INR
INR: 1.25
PROTHROMBIN TIME: 15.8 s — AB (ref 11.4–15.2)

## 2016-07-28 LAB — MAGNESIUM: Magnesium: 3.1 mg/dL — ABNORMAL HIGH (ref 1.7–2.4)

## 2016-07-28 LAB — HEPARIN LEVEL (UNFRACTIONATED): Heparin Unfractionated: 0.59 IU/mL (ref 0.30–0.70)

## 2016-07-28 LAB — APTT: APTT: 34 s (ref 24–36)

## 2016-07-28 LAB — T3 UPTAKE: T3 UPTAKE RATIO: 26 % (ref 24–39)

## 2016-07-28 SURGERY — CORONARY ARTERY BYPASS GRAFTING (CABG)
Anesthesia: General | Site: Chest

## 2016-07-28 MED ORDER — TRANEXAMIC ACID 1000 MG/10ML IV SOLN
1.5000 mg/kg/h | INTRAVENOUS | Status: DC
  Filled 2016-07-28: qty 10

## 2016-07-28 MED ORDER — ESMOLOL HCL 100 MG/10ML IV SOLN
INTRAVENOUS | Status: DC | PRN
  Administered 2016-07-28 (×2): 20 mg via INTRAVENOUS

## 2016-07-28 MED ORDER — PROPOFOL 10 MG/ML IV BOLUS
INTRAVENOUS | Status: DC | PRN
  Administered 2016-07-28: 50 mg via INTRAVENOUS

## 2016-07-28 MED ORDER — ATORVASTATIN CALCIUM 80 MG PO TABS
80.0000 mg | ORAL_TABLET | Freq: Every day | ORAL | Status: DC
  Administered 2016-07-29 – 2016-08-07 (×10): 80 mg via ORAL
  Filled 2016-07-28 (×10): qty 1

## 2016-07-28 MED ORDER — ORAL CARE MOUTH RINSE
15.0000 mL | OROMUCOSAL | Status: DC
  Administered 2016-07-28: 15 mL via OROMUCOSAL

## 2016-07-28 MED ORDER — ACETAMINOPHEN 160 MG/5ML PO SOLN: 650.0000 mg | Freq: Once | ORAL | Status: AC

## 2016-07-28 MED ORDER — LACTATED RINGERS IV SOLN
INTRAVENOUS | Status: DC | PRN
  Administered 2016-07-28: 07:00:00 via INTRAVENOUS

## 2016-07-28 MED ORDER — MIDAZOLAM HCL 10 MG/2ML IJ SOLN
INTRAMUSCULAR | Status: AC
  Filled 2016-07-28: qty 2

## 2016-07-28 MED ORDER — ACETAMINOPHEN 500 MG PO TABS
1000.0000 mg | ORAL_TABLET | Freq: Four times a day (QID) | ORAL | Status: AC
  Administered 2016-07-28 – 2016-08-02 (×16): 1000 mg via ORAL
  Filled 2016-07-28 (×15): qty 2

## 2016-07-28 MED ORDER — PHENYLEPHRINE HCL 10 MG/ML IJ SOLN
0.0000 ug/min | INTRAMUSCULAR | Status: DC
  Administered 2016-07-28: 25 ug/min via INTRAVENOUS
  Filled 2016-07-28 (×2): qty 2

## 2016-07-28 MED ORDER — TRAMADOL HCL 50 MG PO TABS
50.0000 mg | ORAL_TABLET | ORAL | Status: DC | PRN
  Administered 2016-08-02 – 2016-08-06 (×10): 100 mg via ORAL
  Filled 2016-07-28 (×10): qty 2

## 2016-07-28 MED ORDER — ALBUMIN HUMAN 5 % IV SOLN
250.0000 mL | INTRAVENOUS | Status: AC | PRN
  Administered 2016-07-28 (×2): 250 mL via INTRAVENOUS

## 2016-07-28 MED ORDER — CHLORHEXIDINE GLUCONATE 0.12% ORAL RINSE (MEDLINE KIT)
15.0000 mL | Freq: Two times a day (BID) | OROMUCOSAL | Status: DC
  Administered 2016-07-28: 15 mL via OROMUCOSAL

## 2016-07-28 MED ORDER — BISACODYL 10 MG RE SUPP: 10.0000 mg | Freq: Every day | RECTAL | Status: DC

## 2016-07-28 MED ORDER — ACETAMINOPHEN 650 MG RE SUPP
650.0000 mg | Freq: Once | RECTAL | Status: AC
  Administered 2016-07-28: 650 mg via RECTAL

## 2016-07-28 MED ORDER — SODIUM CHLORIDE 0.9 % IJ SOLN
OROMUCOSAL | Status: DC | PRN
  Administered 2016-07-28: 09:00:00 via TOPICAL

## 2016-07-28 MED ORDER — MILRINONE LACTATE IN DEXTROSE 20-5 MG/100ML-% IV SOLN
0.0000 ug/kg/min | INTRAVENOUS | Status: DC
  Administered 2016-07-28 – 2016-07-30 (×3): 0.3 ug/kg/min via INTRAVENOUS
  Filled 2016-07-28 (×4): qty 100

## 2016-07-28 MED ORDER — SODIUM BICARBONATE 8.4 % IV SOLN
INTRAVENOUS | Status: DC | PRN
  Administered 2016-07-28: 50 meq via INTRAVENOUS

## 2016-07-28 MED ORDER — MIDAZOLAM HCL 5 MG/5ML IJ SOLN
INTRAMUSCULAR | Status: DC | PRN
  Administered 2016-07-28 (×2): 1 mg via INTRAVENOUS
  Administered 2016-07-28: 6 mg via INTRAVENOUS
  Administered 2016-07-28: 2 mg via INTRAVENOUS

## 2016-07-28 MED ORDER — SODIUM CHLORIDE 0.9 % IV SOLN
INTRAVENOUS | Status: DC
  Administered 2016-07-28: 2.9 [IU]/h via INTRAVENOUS
  Filled 2016-07-28: qty 2.5

## 2016-07-28 MED ORDER — HEPARIN SODIUM (PORCINE) 1000 UNIT/ML IJ SOLN
INTRAMUSCULAR | Status: AC
  Filled 2016-07-28: qty 1

## 2016-07-28 MED ORDER — MAGNESIUM SULFATE 4 GM/100ML IV SOLN
4.0000 g | Freq: Once | INTRAVENOUS | Status: AC
  Administered 2016-07-28: 4 g via INTRAVENOUS
  Filled 2016-07-28: qty 100

## 2016-07-28 MED ORDER — DOCUSATE SODIUM 100 MG PO CAPS
200.0000 mg | ORAL_CAPSULE | Freq: Every day | ORAL | Status: DC
  Administered 2016-07-31: 200 mg via ORAL
  Filled 2016-07-28 (×2): qty 2

## 2016-07-28 MED ORDER — OXYCODONE HCL 5 MG PO TABS
5.0000 mg | ORAL_TABLET | ORAL | Status: DC | PRN
  Administered 2016-07-29 – 2016-08-01 (×10): 10 mg via ORAL
  Filled 2016-07-28 (×10): qty 2

## 2016-07-28 MED ORDER — VANCOMYCIN HCL IN DEXTROSE 1-5 GM/200ML-% IV SOLN
1000.0000 mg | Freq: Once | INTRAVENOUS | Status: AC
  Administered 2016-07-28: 1000 mg via INTRAVENOUS
  Filled 2016-07-28: qty 200

## 2016-07-28 MED ORDER — LIDOCAINE 2% (20 MG/ML) 5 ML SYRINGE
INTRAMUSCULAR | Status: AC
  Filled 2016-07-28: qty 5

## 2016-07-28 MED ORDER — FAMOTIDINE IN NACL 20-0.9 MG/50ML-% IV SOLN
20.0000 mg | Freq: Two times a day (BID) | INTRAVENOUS | Status: AC
  Administered 2016-07-28: 20 mg via INTRAVENOUS

## 2016-07-28 MED ORDER — VANCOMYCIN HCL IN DEXTROSE 1-5 GM/200ML-% IV SOLN
1000.0000 mg | Freq: Two times a day (BID) | INTRAVENOUS | Status: AC
  Administered 2016-07-29 – 2016-07-30 (×3): 1000 mg via INTRAVENOUS
  Filled 2016-07-28 (×3): qty 200

## 2016-07-28 MED ORDER — ENOXAPARIN SODIUM 40 MG/0.4ML ~~LOC~~ SOLN
40.0000 mg | SUBCUTANEOUS | Status: DC
  Administered 2016-07-29 – 2016-07-31 (×3): 40 mg via SUBCUTANEOUS
  Filled 2016-07-28 (×3): qty 0.4

## 2016-07-28 MED ORDER — INSULIN REGULAR BOLUS VIA INFUSION
0.0000 [IU] | Freq: Three times a day (TID) | INTRAVENOUS | Status: DC
  Filled 2016-07-28: qty 10

## 2016-07-28 MED ORDER — BISACODYL 5 MG PO TBEC
10.0000 mg | DELAYED_RELEASE_TABLET | Freq: Every day | ORAL | Status: DC
  Administered 2016-07-31: 10 mg via ORAL
  Filled 2016-07-28 (×2): qty 2

## 2016-07-28 MED ORDER — ARTIFICIAL TEARS OP OINT
TOPICAL_OINTMENT | OPHTHALMIC | Status: AC
  Filled 2016-07-28: qty 3.5

## 2016-07-28 MED ORDER — MORPHINE SULFATE (PF) 2 MG/ML IV SOLN
2.0000 mg | INTRAVENOUS | Status: DC | PRN
  Administered 2016-07-28 – 2016-07-29 (×3): 2 mg via INTRAVENOUS
  Filled 2016-07-28 (×3): qty 1

## 2016-07-28 MED ORDER — FENTANYL CITRATE (PF) 250 MCG/5ML IJ SOLN
INTRAMUSCULAR | Status: AC
  Filled 2016-07-28: qty 25

## 2016-07-28 MED ORDER — FENTANYL CITRATE (PF) 250 MCG/5ML IJ SOLN
INTRAMUSCULAR | Status: DC | PRN
  Administered 2016-07-28: 250 ug via INTRAVENOUS
  Administered 2016-07-28: 900 ug via INTRAVENOUS
  Administered 2016-07-28: 50 ug via INTRAVENOUS
  Administered 2016-07-28: 250 ug via INTRAVENOUS
  Administered 2016-07-28: 50 ug via INTRAVENOUS

## 2016-07-28 MED ORDER — MILRINONE LACTATE IN DEXTROSE 20-5 MG/100ML-% IV SOLN
0.1250 ug/kg/min | INTRAVENOUS | Status: AC
  Administered 2016-07-28: 0.25 ug/kg/min via INTRAVENOUS
  Filled 2016-07-28: qty 100

## 2016-07-28 MED ORDER — 0.9 % SODIUM CHLORIDE (POUR BTL) OPTIME
TOPICAL | Status: DC | PRN
  Administered 2016-07-28: 6000 mL

## 2016-07-28 MED ORDER — PAROXETINE HCL 20 MG PO TABS
40.0000 mg | ORAL_TABLET | Freq: Every day | ORAL | Status: DC
  Administered 2016-07-29 – 2016-08-07 (×10): 40 mg via ORAL
  Filled 2016-07-28 (×10): qty 2

## 2016-07-28 MED ORDER — PROPOFOL 10 MG/ML IV BOLUS
INTRAVENOUS | Status: AC
  Filled 2016-07-28: qty 20

## 2016-07-28 MED ORDER — SODIUM CHLORIDE 0.45 % IV SOLN
INTRAVENOUS | Status: DC | PRN
  Administered 2016-07-28: 20 mL/h via INTRAVENOUS

## 2016-07-28 MED ORDER — ORAL CARE MOUTH RINSE: 15.0000 mL | Freq: Two times a day (BID) | OROMUCOSAL | Status: DC

## 2016-07-28 MED ORDER — POTASSIUM CHLORIDE 10 MEQ/50ML IV SOLN: 10.0000 meq | INTRAVENOUS | Status: AC

## 2016-07-28 MED ORDER — ASPIRIN EC 325 MG PO TBEC
325.0000 mg | DELAYED_RELEASE_TABLET | Freq: Every day | ORAL | Status: DC
  Administered 2016-07-30 – 2016-08-07 (×9): 325 mg via ORAL
  Filled 2016-07-28 (×9): qty 1

## 2016-07-28 MED ORDER — CHLORHEXIDINE GLUCONATE 0.12 % MT SOLN
15.0000 mL | OROMUCOSAL | Status: AC
  Administered 2016-07-28: 15 mL via OROMUCOSAL
  Filled 2016-07-28: qty 15

## 2016-07-28 MED ORDER — ASPIRIN 81 MG PO CHEW: 324.0000 mg | CHEWABLE_TABLET | Freq: Every day | ORAL | Status: DC

## 2016-07-28 MED ORDER — AMIODARONE HCL IN DEXTROSE 360-4.14 MG/200ML-% IV SOLN
INTRAVENOUS | Status: DC | PRN
  Administered 2016-07-28: 60 mg/h via INTRAVENOUS

## 2016-07-28 MED ORDER — HEPARIN SODIUM (PORCINE) 1000 UNIT/ML IJ SOLN
INTRAMUSCULAR | Status: AC
Start: 2016-07-28 — End: 2016-07-28
  Filled 2016-07-28: qty 1

## 2016-07-28 MED ORDER — DEXMEDETOMIDINE HCL IN NACL 200 MCG/50ML IV SOLN: 0.0000 ug/kg/h | INTRAVENOUS | Status: DC

## 2016-07-28 MED ORDER — PANTOPRAZOLE SODIUM 40 MG PO TBEC
40.0000 mg | DELAYED_RELEASE_TABLET | Freq: Every day | ORAL | Status: DC
  Administered 2016-07-30 – 2016-07-31 (×2): 40 mg via ORAL
  Filled 2016-07-28 (×2): qty 1

## 2016-07-28 MED ORDER — ALBUMIN HUMAN 5 % IV SOLN
INTRAVENOUS | Status: DC | PRN
  Administered 2016-07-28: 13:00:00 via INTRAVENOUS

## 2016-07-28 MED ORDER — METOPROLOL TARTRATE 5 MG/5ML IV SOLN: 2.5000 mg | INTRAVENOUS | Status: DC | PRN

## 2016-07-28 MED ORDER — SODIUM CHLORIDE 0.9 % IJ SOLN
INTRAMUSCULAR | Status: AC
Start: 2016-07-28 — End: 2016-07-28
  Filled 2016-07-28: qty 10

## 2016-07-28 MED ORDER — NITROGLYCERIN IN D5W 200-5 MCG/ML-% IV SOLN: 0.0000 ug/min | INTRAVENOUS | Status: DC

## 2016-07-28 MED ORDER — ROCURONIUM BROMIDE 10 MG/ML (PF) SYRINGE
PREFILLED_SYRINGE | INTRAVENOUS | Status: AC
  Filled 2016-07-28: qty 10

## 2016-07-28 MED ORDER — PROTAMINE SULFATE 10 MG/ML IV SOLN
INTRAVENOUS | Status: DC | PRN
  Administered 2016-07-28: 400 mg via INTRAVENOUS

## 2016-07-28 MED ORDER — SODIUM CHLORIDE 0.9 % IV SOLN: INTRAVENOUS | Status: DC

## 2016-07-28 MED ORDER — AMIODARONE HCL IN DEXTROSE 360-4.14 MG/200ML-% IV SOLN
30.0000 mg/h | INTRAVENOUS | Status: DC
  Administered 2016-07-29 – 2016-07-30 (×2): 30 mg/h via INTRAVENOUS
  Filled 2016-07-28 (×4): qty 200

## 2016-07-28 MED ORDER — LACTATED RINGERS IV SOLN: 500.0000 mL | Freq: Once | INTRAVENOUS | Status: DC | PRN

## 2016-07-28 MED ORDER — METOPROLOL TARTRATE 25 MG/10 ML ORAL SUSPENSION: 12.5000 mg | Freq: Two times a day (BID) | ORAL | Status: DC

## 2016-07-28 MED ORDER — ONDANSETRON HCL 4 MG/2ML IJ SOLN
4.0000 mg | Freq: Four times a day (QID) | INTRAMUSCULAR | Status: DC | PRN
  Administered 2016-07-29 – 2016-07-31 (×4): 4 mg via INTRAVENOUS
  Filled 2016-07-28 (×5): qty 2

## 2016-07-28 MED ORDER — HEPARIN SODIUM (PORCINE) 1000 UNIT/ML IJ SOLN
INTRAMUSCULAR | Status: DC | PRN
  Administered 2016-07-28: 30000 [IU] via INTRAVENOUS
  Administered 2016-07-28: 3000 [IU] via INTRAVENOUS

## 2016-07-28 MED ORDER — MAGNESIUM SULFATE 4 GM/100ML IV SOLN
INTRAVENOUS | Status: AC
  Filled 2016-07-28: qty 100

## 2016-07-28 MED ORDER — METOPROLOL TARTRATE 12.5 MG HALF TABLET
12.5000 mg | ORAL_TABLET | Freq: Two times a day (BID) | ORAL | Status: DC
  Administered 2016-07-29 – 2016-07-30 (×2): 12.5 mg via ORAL
  Filled 2016-07-28 (×2): qty 1

## 2016-07-28 MED ORDER — SODIUM CHLORIDE 0.9% FLUSH
3.0000 mL | Freq: Two times a day (BID) | INTRAVENOUS | Status: DC
  Administered 2016-07-29: 10 mL via INTRAVENOUS
  Administered 2016-07-29 – 2016-08-01 (×5): 3 mL via INTRAVENOUS

## 2016-07-28 MED ORDER — PHENYLEPHRINE HCL 10 MG/ML IJ SOLN
INTRAVENOUS | Status: DC | PRN
  Administered 2016-07-28: 15 ug/min via INTRAVENOUS

## 2016-07-28 MED ORDER — LACTATED RINGERS IV SOLN
INTRAVENOUS | Status: DC
  Administered 2016-07-29: 08:00:00 via INTRAVENOUS

## 2016-07-28 MED ORDER — AMIODARONE HCL IN DEXTROSE 360-4.14 MG/200ML-% IV SOLN
60.0000 mg/h | INTRAVENOUS | Status: AC
  Administered 2016-07-28: 60 mg/h via INTRAVENOUS
  Filled 2016-07-28: qty 200

## 2016-07-28 MED ORDER — HEMOSTATIC AGENTS (NO CHARGE) OPTIME
TOPICAL | Status: DC | PRN
  Administered 2016-07-28 (×2): 1 via TOPICAL

## 2016-07-28 MED ORDER — FENTANYL CITRATE (PF) 250 MCG/5ML IJ SOLN
INTRAMUSCULAR | Status: AC
  Filled 2016-07-28: qty 5

## 2016-07-28 MED ORDER — LACTATED RINGERS IV SOLN: INTRAVENOUS | Status: DC

## 2016-07-28 MED ORDER — DEXMEDETOMIDINE HCL IN NACL 400 MCG/100ML IV SOLN
0.4000 ug/kg/h | Freq: Once | INTRAVENOUS | Status: DC
  Filled 2016-07-28: qty 100

## 2016-07-28 MED ORDER — ACETAMINOPHEN 160 MG/5ML PO SOLN: 1000.0000 mg | Freq: Four times a day (QID) | ORAL | Status: DC

## 2016-07-28 MED ORDER — MIDAZOLAM HCL 2 MG/2ML IJ SOLN: 2.0000 mg | INTRAMUSCULAR | Status: DC | PRN

## 2016-07-28 MED ORDER — CHLORHEXIDINE GLUCONATE 0.12 % MT SOLN
15.0000 mL | Freq: Two times a day (BID) | OROMUCOSAL | Status: DC
Start: 2016-07-29 — End: 2016-07-31
  Administered 2016-07-29 – 2016-07-30 (×4): 15 mL via OROMUCOSAL
  Filled 2016-07-28 (×4): qty 15

## 2016-07-28 MED ORDER — ROCURONIUM BROMIDE 10 MG/ML (PF) SYRINGE
PREFILLED_SYRINGE | INTRAVENOUS | Status: DC | PRN
  Administered 2016-07-28 (×5): 50 mg via INTRAVENOUS

## 2016-07-28 MED ORDER — SODIUM CHLORIDE 0.9 % IV SOLN: Freq: Once | INTRAVENOUS | Status: DC

## 2016-07-28 MED ORDER — LEVOFLOXACIN IN D5W 750 MG/150ML IV SOLN
750.0000 mg | INTRAVENOUS | Status: AC
  Administered 2016-07-29: 750 mg via INTRAVENOUS
  Filled 2016-07-28: qty 150

## 2016-07-28 MED ORDER — ARTIFICIAL TEARS OP OINT
TOPICAL_OINTMENT | OPHTHALMIC | Status: DC | PRN
  Administered 2016-07-28: 1 via OPHTHALMIC

## 2016-07-28 MED ORDER — SODIUM CHLORIDE 0.9% FLUSH
3.0000 mL | INTRAVENOUS | Status: DC | PRN
  Administered 2016-07-29 (×2): 10 mL via INTRAVENOUS
  Filled 2016-07-28 (×2): qty 3

## 2016-07-28 MED ORDER — SODIUM CHLORIDE 0.9 % IV SOLN
250.0000 mL | INTRAVENOUS | Status: DC
  Administered 2016-07-29: 250 mL via INTRAVENOUS

## 2016-07-28 MED ORDER — MORPHINE SULFATE (PF) 2 MG/ML IV SOLN
1.0000 mg | INTRAVENOUS | Status: AC | PRN
  Administered 2016-07-28 (×2): 2 mg via INTRAVENOUS
  Filled 2016-07-28: qty 2

## 2016-07-28 MED ORDER — MILRINONE LACTATE IN DEXTROSE 20-5 MG/100ML-% IV SOLN: 0.3000 ug/kg/min | INTRAVENOUS | Status: DC

## 2016-07-28 MED FILL — Heparin Sodium (Porcine) Inj 1000 Unit/ML: INTRAMUSCULAR | Qty: 30 | Status: AC

## 2016-07-28 MED FILL — Magnesium Sulfate Inj 50%: INTRAMUSCULAR | Qty: 10 | Status: AC

## 2016-07-28 MED FILL — Potassium Chloride Inj 2 mEq/ML: INTRAVENOUS | Qty: 40 | Status: AC

## 2016-07-28 SURGICAL SUPPLY — 106 items
ADAPTER CARDIO PERF ANTE/RETRO (ADAPTER) ×4 IMPLANT
BAG DECANTER FOR FLEXI CONT (MISCELLANEOUS) ×4 IMPLANT
BANDAGE ACE 4X5 VEL STRL LF (GAUZE/BANDAGES/DRESSINGS) ×4 IMPLANT
BANDAGE ACE 6X5 VEL STRL LF (GAUZE/BANDAGES/DRESSINGS) ×4 IMPLANT
BASKET HEART  (ORDER IN 25'S) (MISCELLANEOUS) ×1
BASKET HEART (ORDER IN 25'S) (MISCELLANEOUS) ×1
BASKET HEART (ORDER IN 25S) (MISCELLANEOUS) ×2 IMPLANT
BINDER BREAST XLRG (GAUZE/BANDAGES/DRESSINGS) ×4 IMPLANT
BLADE STERNUM SYSTEM 6 (BLADE) ×4 IMPLANT
BLADE SURG 12 STRL SS (BLADE) ×4 IMPLANT
BLADE SURG ROTATE 9660 (MISCELLANEOUS) IMPLANT
BNDG GAUZE ELAST 4 BULKY (GAUZE/BANDAGES/DRESSINGS) ×4 IMPLANT
CANISTER SUCTION 2500CC (MISCELLANEOUS) ×4 IMPLANT
CANNULA GUNDRY RCSP 15FR (MISCELLANEOUS) ×4 IMPLANT
CATH CPB KIT VANTRIGT (MISCELLANEOUS) ×4 IMPLANT
CATH ROBINSON RED A/P 18FR (CATHETERS) ×12 IMPLANT
CATH THORACIC 36FR RT ANG (CATHETERS) ×4 IMPLANT
CLIP FOGARTY SPRING 6M (CLIP) ×4 IMPLANT
CLIP TI WIDE RED SMALL 24 (CLIP) ×4 IMPLANT
CRADLE DONUT ADULT HEAD (MISCELLANEOUS) ×4 IMPLANT
DERMABOND ADVANCED (GAUZE/BANDAGES/DRESSINGS) ×2
DERMABOND ADVANCED .7 DNX12 (GAUZE/BANDAGES/DRESSINGS) ×2 IMPLANT
DRAIN CHANNEL 32F RND 10.7 FF (WOUND CARE) ×4 IMPLANT
DRAPE CARDIOVASCULAR INCISE (DRAPES) ×2
DRAPE SLUSH/WARMER DISC (DRAPES) ×4 IMPLANT
DRAPE SRG 135X102X78XABS (DRAPES) ×2 IMPLANT
DRSG AQUACEL AG ADV 3.5X14 (GAUZE/BANDAGES/DRESSINGS) ×4 IMPLANT
DRSG COVADERM 4X14 (GAUZE/BANDAGES/DRESSINGS) ×4 IMPLANT
ELECT BLADE 4.0 EZ CLEAN MEGAD (MISCELLANEOUS) ×4
ELECT BLADE 6.5 EXT (BLADE) ×4 IMPLANT
ELECT CAUTERY BLADE 6.4 (BLADE) ×4 IMPLANT
ELECT REM PT RETURN 9FT ADLT (ELECTROSURGICAL) ×8
ELECTRODE BLDE 4.0 EZ CLN MEGD (MISCELLANEOUS) ×2 IMPLANT
ELECTRODE REM PT RTRN 9FT ADLT (ELECTROSURGICAL) ×4 IMPLANT
FELT TEFLON 1X6 (MISCELLANEOUS) ×4 IMPLANT
GAUZE SPONGE 4X4 12PLY STRL (GAUZE/BANDAGES/DRESSINGS) ×8 IMPLANT
GAUZE SPONGE 4X4 16PLY XRAY LF (GAUZE/BANDAGES/DRESSINGS) ×4 IMPLANT
GLOVE BIO SURGEON STRL SZ7.5 (GLOVE) ×12 IMPLANT
GLOVE BIOGEL PI IND STRL 6 (GLOVE) ×2 IMPLANT
GLOVE BIOGEL PI IND STRL 6.5 (GLOVE) ×6 IMPLANT
GLOVE BIOGEL PI IND STRL 7.0 (GLOVE) ×6 IMPLANT
GLOVE BIOGEL PI INDICATOR 6 (GLOVE) ×2
GLOVE BIOGEL PI INDICATOR 6.5 (GLOVE) ×6
GLOVE BIOGEL PI INDICATOR 7.0 (GLOVE) ×6
GLOVE SURG SS PI 6.0 STRL IVOR (GLOVE) ×8 IMPLANT
GLOVE SURG SS PI 6.5 STRL IVOR (GLOVE) ×28 IMPLANT
GLOVE SURG SS PI 7.5 STRL IVOR (GLOVE) ×8 IMPLANT
GOWN STRL REUS W/ TWL LRG LVL3 (GOWN DISPOSABLE) ×20 IMPLANT
GOWN STRL REUS W/TWL LRG LVL3 (GOWN DISPOSABLE) ×20
HEMOSTAT POWDER SURGIFOAM 1G (HEMOSTASIS) ×12 IMPLANT
HEMOSTAT SURGICEL 2X14 (HEMOSTASIS) ×4 IMPLANT
INSERT FOGARTY XLG (MISCELLANEOUS) IMPLANT
KIT BASIN OR (CUSTOM PROCEDURE TRAY) ×4 IMPLANT
KIT ROOM TURNOVER OR (KITS) ×4 IMPLANT
KIT SUCTION CATH 14FR (SUCTIONS) ×4 IMPLANT
KIT VASOVIEW HEMOPRO VH 3000 (KITS) ×4 IMPLANT
LEAD PACING MYOCARDI (MISCELLANEOUS) ×4 IMPLANT
MARKER GRAFT CORONARY BYPASS (MISCELLANEOUS) ×16 IMPLANT
NS IRRIG 1000ML POUR BTL (IV SOLUTION) ×20 IMPLANT
PACK OPEN HEART (CUSTOM PROCEDURE TRAY) ×4 IMPLANT
PAD ARMBOARD 7.5X6 YLW CONV (MISCELLANEOUS) ×8 IMPLANT
PAD ELECT DEFIB RADIOL ZOLL (MISCELLANEOUS) ×4 IMPLANT
PENCIL BUTTON HOLSTER BLD 10FT (ELECTRODE) ×4 IMPLANT
PUNCH AORTIC ROTATE  4.5MM 8IN (MISCELLANEOUS) ×4 IMPLANT
PUNCH AORTIC ROTATE 4.0MM (MISCELLANEOUS) IMPLANT
PUNCH AORTIC ROTATE 4.5MM 8IN (MISCELLANEOUS) IMPLANT
PUNCH AORTIC ROTATE 5MM 8IN (MISCELLANEOUS) IMPLANT
SET CARDIOPLEGIA MPS 5001102 (MISCELLANEOUS) ×4 IMPLANT
SOLUTION ANTI FOG 6CC (MISCELLANEOUS) ×4 IMPLANT
SPONGE LAP 18X18 X RAY DECT (DISPOSABLE) ×4 IMPLANT
SPONGE LAP 4X18 X RAY DECT (DISPOSABLE) ×4 IMPLANT
SURGIFLO W/THROMBIN 8M KIT (HEMOSTASIS) ×4 IMPLANT
SUT BONE WAX W31G (SUTURE) ×4 IMPLANT
SUT MNCRL AB 4-0 PS2 18 (SUTURE) ×4 IMPLANT
SUT PROLENE 3 0 SH DA (SUTURE) IMPLANT
SUT PROLENE 3 0 SH1 36 (SUTURE) IMPLANT
SUT PROLENE 4 0 RB 1 (SUTURE) ×2
SUT PROLENE 4 0 SH DA (SUTURE) ×4 IMPLANT
SUT PROLENE 4-0 RB1 .5 CRCL 36 (SUTURE) ×2 IMPLANT
SUT PROLENE 5 0 C 1 36 (SUTURE) IMPLANT
SUT PROLENE 6 0 C 1 30 (SUTURE) ×8 IMPLANT
SUT PROLENE 6 0 CC (SUTURE) ×16 IMPLANT
SUT PROLENE 8 0 BV175 6 (SUTURE) IMPLANT
SUT PROLENE BLUE 7 0 (SUTURE) ×8 IMPLANT
SUT SILK  1 MH (SUTURE)
SUT SILK 1 MH (SUTURE) IMPLANT
SUT SILK 2 0 SH CR/8 (SUTURE) ×4 IMPLANT
SUT SILK 3 0 SH CR/8 (SUTURE) IMPLANT
SUT STEEL 6MS V (SUTURE) ×4 IMPLANT
SUT STEEL SZ 6 DBL 3X14 BALL (SUTURE) ×8 IMPLANT
SUT VIC AB 1 CTX 36 (SUTURE) ×4
SUT VIC AB 1 CTX36XBRD ANBCTR (SUTURE) ×4 IMPLANT
SUT VIC AB 2-0 CT1 27 (SUTURE) ×2
SUT VIC AB 2-0 CT1 TAPERPNT 27 (SUTURE) ×2 IMPLANT
SUT VIC AB 2-0 CTX 27 (SUTURE) IMPLANT
SUT VIC AB 3-0 X1 27 (SUTURE) IMPLANT
SUTURE E-PAK OPEN HEART (SUTURE) ×4 IMPLANT
SYSTEM SAHARA CHEST DRAIN ATS (WOUND CARE) ×4 IMPLANT
TAPE CLOTH SURG 4X10 WHT LF (GAUZE/BANDAGES/DRESSINGS) ×4 IMPLANT
TAPE PAPER MEDFIX 1IN X 10YD (GAUZE/BANDAGES/DRESSINGS) ×4 IMPLANT
TOWEL OR 17X24 6PK STRL BLUE (TOWEL DISPOSABLE) ×8 IMPLANT
TOWEL OR 17X26 10 PK STRL BLUE (TOWEL DISPOSABLE) ×8 IMPLANT
TRAY FOLEY IC TEMP SENS 16FR (CATHETERS) ×4 IMPLANT
TUBING INSUFFLATION (TUBING) ×4 IMPLANT
UNDERPAD 30X30 (UNDERPADS AND DIAPERS) ×4 IMPLANT
WATER STERILE IRR 1000ML POUR (IV SOLUTION) ×8 IMPLANT

## 2016-07-28 NOTE — Brief Op Note (Signed)
07/23/2016 - 07/28/2016  12:04 PM  PATIENT:  Tamara Henry  55 y.o. female  PRE-OPERATIVE DIAGNOSIS:  CAD  POST-OPERATIVE DIAGNOSIS:  coronary artery disease  PROCEDURE:  Procedure(s): CORONARY ARTERY BYPASS GRAFTING (CABG)x4 using left internal mammary and endoscopic harvest of right greater saphenous vein (N/A) TRANSESOPHAGEAL ECHOCARDIOGRAM (TEE) (N/A)  SURGEON:  Surgeon(s) and Role:    * Ivin Poot, MD - Primary  PHYSICIAN ASSISTANT:  Nicholes Rough, PA-C  ANESTHESIA:   general  EBL:  Total I/O In: 1100 [I.V.:1100] Out: [id number] [Urine:1395]  BLOOD ADMINISTERED:none  DRAINS: routine   LOCAL MEDICATIONS USED:  NONE  SPECIMEN:  No Specimen  DISPOSITION OF SPECIMEN:  N/A  COUNTS:  YES  TOURNIQUET:  * No tourniquets in log *  DICTATION: .Other Dictation: Dictation Number pending  PLAN OF CARE: Admit to inpatient   PATIENT DISPOSITION:  ICU - intubated and hemodynamically stable.   Delay start of Pharmacological VTE agent (>24hrs) due to surgical blood loss or risk of bleeding: yes

## 2016-07-28 NOTE — Progress Notes (Signed)
Rapid wean protocol initiated. RN aware.

## 2016-07-28 NOTE — Anesthesia Postprocedure Evaluation (Signed)
Anesthesia Post Note  Patient: Tamara Henry  Procedure(s) Performed: Procedure(s) (LRB): CORONARY ARTERY BYPASS GRAFTING (CABG)x4 using left internal mammary and endoscopic harvest of right greater saphenous vein (N/A) TRANSESOPHAGEAL ECHOCARDIOGRAM (TEE) (N/A)  Anesthesia Post Evaluation  Last Vitals:  Vitals:   07/28/16 0453 07/28/16 1410  BP: 134/68 116/77  Pulse: 67 96  Resp: 18 18  Temp: 36.8 C     Last Pain:  Vitals:   07/28/16 0453  TempSrc: Oral  PainSc:                  Aiva Miskell DAVID

## 2016-07-28 NOTE — Progress Notes (Signed)
[facility] BRIEF SICU PROGRESS NOTE  Day of Surgery  S/P Procedure(s) (LRB): CORONARY ARTERY BYPASS GRAFTING (CABG)x4 using left internal mammary and endoscopic harvest of right greater saphenous vein (N/A) TRANSESOPHAGEAL ECHOCARDIOGRAM (TEE) (N/A)   Sedated on vent NSR w/ stable hemodynamics on milrinone and Neo O2 sats 97% on 50% FiO2 Chest tube output low UOP excellent Labs okay  Plan: Continue routine early postop.  Watch oxygenation closely  Rexene Alberts, MD 07/28/2016 5:58 PM

## 2016-07-28 NOTE — Transfer of Care (Signed)
Immediate Anesthesia Transfer of Care Note  Patient: Tamara Henry  Procedure(s) Performed: Procedure(s): CORONARY ARTERY BYPASS GRAFTING (CABG)x4 using left internal mammary and endoscopic harvest of right greater saphenous vein (N/A) TRANSESOPHAGEAL ECHOCARDIOGRAM (TEE) (N/A)  Patient Location: ICU  Anesthesia Type:General  Level of Consciousness: sedated  Airway & Oxygen Therapy: Patient remains intubated per anesthesia plan  Post-op Assessment: Report given to RN and Post -op Vital signs reviewed and stable  Post vital signs: Reviewed and stable  Last Vitals:  Vitals:   07/28/16 0453 07/28/16 1410  BP: 134/68 116/77  Pulse: 67 96  Resp: 18 18  Temp: 36.8 C     Last Pain:  Vitals:   07/28/16 0453  TempSrc: Oral  PainSc:       Patients Stated Pain Goal: 0 ([id number])  Complications: No apparent anesthesia complications

## 2016-07-28 NOTE — Anesthesia Procedure Notes (Signed)
Central Venous Catheter Insertion Performed by: anesthesiologist Patient location: Pre-op. Preanesthetic checklist: patient identified, IV checked, site marked, risks and benefits discussed, surgical consent, monitors and equipment checked, pre-op evaluation, timeout performed and anesthesia consent Lidocaine 1% used for infiltration Landmarks identified Catheter size: 9 Fr MAC introducer Procedure performed using ultrasound guided technique. Attempts: 1 Following insertion, line sutured and dressing applied. Post procedure assessment: blood return through all ports, free fluid flow and no air. Patient tolerated the procedure well with no immediate complications.

## 2016-07-28 NOTE — Anesthesia Procedure Notes (Signed)
Procedure Name: Intubation Date/Time: 07/28/2016 7:45 AM Performed by: Everlean Cherry A Pre-anesthesia Checklist: Patient identified, Emergency Drugs available, Suction available and Patient being monitored Patient Re-evaluated:Patient Re-evaluated prior to inductionOxygen Delivery Method: Circle System Utilized Preoxygenation: Pre-oxygenation with 100% oxygen Intubation Type: IV induction Ventilation: Oral airway inserted - appropriate to patient size Laryngoscope Size: Mac and 3 Grade View: Grade III Tube type: Oral Tube size: 8.0 mm Number of attempts: 1 Airway Equipment and Method: Stylet and Oral airway Placement Confirmation: ETT inserted through vocal cords under direct vision,  positive ETCO2 and breath sounds checked- equal and bilateral Secured at: 22 (at the teeth) cm Tube secured with: Tape Dental Injury: Teeth and Oropharynx as per pre-operative assessment  Comments: Intubation performed by Kpike, SRNA. Teeth and Oropharynx as preoperative assessment. Atraumatic intubation.

## 2016-07-28 NOTE — OR Nursing (Signed)
SICU 1st call @1255 , Christine SICU 2nd call @ Jeff, Doris

## 2016-07-28 NOTE — Progress Notes (Signed)
The patient was examined and preop studies reviewed. There has been no change from the prior exam and the patient is ready for surgery.  plan CABG on  A Tisd[place]

## 2016-07-28 NOTE — Progress Notes (Signed)
Rapid Wean Protocol begun, patient is tolerating fairly well at this time.

## 2016-07-28 NOTE — Progress Notes (Signed)
Patient placed back on SIMV with rate of 10, after failing parameters for weaning (VC 0.3 L and NIF -20) and being tachypneic and not able to calm down. Will attempt again at later time.

## 2016-07-28 NOTE — Anesthesia Procedure Notes (Signed)
Central Venous Catheter Insertion Performed by: anesthesiologist Patient location: Pre-op. Preanesthetic checklist: patient identified, IV checked, site marked, risks and benefits discussed, surgical consent, monitors and equipment checked, pre-op evaluation, timeout performed and anesthesia consent Landmarks identified PA cath was placed.Swan type and PA catheter depth:thermodilation and 45PA Cath depth:45 Procedure performed using ultrasound guided technique. Attempts: 1 Patient tolerated the procedure well with no immediate complications.

## 2016-07-28 NOTE — Procedures (Signed)
Extubation Procedure Note  Patient Details:   Name: Tamara Henry DOB: 1960/12/10 MRN: [id number]   Airway Documentation:  Airway 8 mm (Active)  Secured at (cm) 23 cm 07/28/2016  8:59 PM  Measured From Lips 07/28/2016  8:59 PM  Secured Location Right 07/28/2016  8:59 PM  Secured By Pink Tape 07/28/2016  8:59 PM  Site Condition Dry 07/28/2016  8:59 PM    Evaluation  O2 sats: stable throughout Complications: No apparent complications Patient did tolerate procedure well. Bilateral Breath Sounds: Clear, Diminished   Yes  Jori Moll 07/28/2016, 9:57 PM   Patient performed an VC of .563ml, NIF of -23, and cuff leak was present prior to extubation. Patient performed an IS on 274ml. Patient is a little lethargic, but answering questions appropriately and following commands. RT will continue to monitor.

## 2016-07-29 ENCOUNTER — Inpatient Hospital Stay (HOSPITAL_COMMUNITY): Payer: Medicare Other

## 2016-07-29 DIAGNOSIS — Z951 Presence of aortocoronary bypass graft: Secondary | ICD-10-CM

## 2016-07-29 LAB — POCT I-STAT 3, ART BLOOD GAS (G3+)
ACID-BASE DEFICIT: 2 mmol/L (ref 0.0–2.0)
Acid-base deficit: 3 mmol/L — ABNORMAL HIGH (ref 0.0–2.0)
Acid-base deficit: 2 mmol/L (ref 0.0–2.0)
BICARBONATE: 23.3 mmol/L (ref 20.0–28.0)
Bicarbonate: 22.2 mmol/L (ref 20.0–28.0)
Bicarbonate: 22.5 mmol/L (ref 20.0–28.0)
O2 SAT: 84 %
O2 SAT: 86 %
O2 SAT: 97 %
PCO2 ART: 39.7 mmHg (ref 32.0–48.0)
PCO2 ART: 37 mmHg (ref 32.0–48.0)
PH ART: 7.392 (ref 7.350–7.450)
PO2 ART: 50 mmHg — AB (ref 83.0–108.0)
Patient temperature: 36.8
Patient temperature: 36.8
TCO2: 23 mmol/L (ref 0–100)
TCO2: 24 mmol/L (ref 0–100)
TCO2: 25 mmol/L (ref 0–100)
pCO2 arterial: 39.4 mmHg (ref 32.0–48.0)
pH, Arterial: 7.355 (ref 7.350–7.450)
pH, Arterial: 7.379 (ref 7.350–7.450)
pO2, Arterial: 50 mmHg — ABNORMAL LOW (ref 83.0–108.0)
pO2, Arterial: 91 mmHg (ref 83.0–108.0)

## 2016-07-29 LAB — GLUCOSE, CAPILLARY
GLUCOSE-CAPILLARY: 103 mg/dL — AB (ref 65–99)
GLUCOSE-CAPILLARY: 120 mg/dL — AB (ref 65–99)
GLUCOSE-CAPILLARY: 115 mg/dL — AB (ref 65–99)
GLUCOSE-CAPILLARY: 110 mg/dL — AB (ref 65–99)
GLUCOSE-CAPILLARY: 150 mg/dL — AB (ref 65–99)
GLUCOSE-CAPILLARY: 137 mg/dL — AB (ref 65–99)
GLUCOSE-CAPILLARY: 134 mg/dL — AB (ref 65–99)
GLUCOSE-CAPILLARY: 183 mg/dL — AB (ref 65–99)
Glucose-Capillary: 104 mg/dL — ABNORMAL HIGH (ref 65–99)
Glucose-Capillary: 109 mg/dL — ABNORMAL HIGH (ref 65–99)
Glucose-Capillary: 116 mg/dL — ABNORMAL HIGH (ref 65–99)
Glucose-Capillary: 118 mg/dL — ABNORMAL HIGH (ref 65–99)
Glucose-Capillary: 148 mg/dL — ABNORMAL HIGH (ref 65–99)
Glucose-Capillary: 251 mg/dL — ABNORMAL HIGH (ref 65–99)
Glucose-Capillary: 287 mg/dL — ABNORMAL HIGH (ref 65–99)

## 2016-07-29 LAB — BASIC METABOLIC PANEL
Anion gap: 8 (ref 5–15)
BUN: 21 mg/dL — AB (ref 6–20)
CALCIUM: 7.6 mg/dL — AB (ref 8.9–10.3)
CO2: 24 mmol/L (ref 22–32)
CREATININE: 1.45 mg/dL — AB (ref 0.44–1.00)
Chloride: 108 mmol/L (ref 101–111)
GFR calc Af Amer: 47 mL/min — ABNORMAL LOW (ref >60)
GFR, EST NON AFRICAN AMERICAN: 40 mL/min — AB (ref >60)
GLUCOSE: 109 mg/dL — AB (ref 65–99)
Potassium: 4 mmol/L (ref 3.5–5.1)
Sodium: 140 mmol/L (ref 135–145)

## 2016-07-29 LAB — CBC
HEMATOCRIT: 25.3 % — AB (ref 36.0–46.0)
HEMATOCRIT: 26 % — AB (ref 36.0–46.0)
Hemoglobin: 8.3 g/dL — ABNORMAL LOW (ref 12.0–15.0)
Hemoglobin: 8.3 g/dL — ABNORMAL LOW (ref 12.0–15.0)
MCH: 29 pg (ref 26.0–34.0)
MCH: 28.5 pg (ref 26.0–34.0)
MCHC: 32.8 g/dL (ref 30.0–36.0)
MCHC: 31.9 g/dL (ref 30.0–36.0)
MCV: 88.5 fL (ref 78.0–100.0)
MCV: 89.3 fL (ref 78.0–100.0)
PLATELETS: 169 10*3/uL (ref 150–400)
Platelets: 176 10*3/uL (ref 150–400)
RBC: 2.86 MIL/uL — ABNORMAL LOW (ref 3.87–5.11)
RBC: 2.91 MIL/uL — ABNORMAL LOW (ref 3.87–5.11)
RDW: 13.9 % (ref 11.5–15.5)
RDW: 14 % (ref 11.5–15.5)
WBC: 12.4 10*3/uL — ABNORMAL HIGH (ref 4.0–10.5)
WBC: 13.6 10*3/uL — ABNORMAL HIGH (ref 4.0–10.5)

## 2016-07-29 LAB — PREPARE FRESH FROZEN PLASMA
UNIT DIVISION: 0
Unit division: 0

## 2016-07-29 LAB — POCT I-STAT, CHEM 8
BUN: 22 mg/dL — AB (ref 6–20)
CALCIUM ION: 0.86 mmol/L — AB (ref 1.15–1.40)
CHLORIDE: 101 mmol/L (ref 101–111)
CREATININE: 1.4 mg/dL — AB (ref 0.44–1.00)
GLUCOSE: 233 mg/dL — AB (ref 65–99)
HCT: 25 % — ABNORMAL LOW (ref 36.0–46.0)
Hemoglobin: 8.5 g/dL — ABNORMAL LOW (ref 12.0–15.0)
Potassium: 4.6 mmol/L (ref 3.5–5.1)
SODIUM: 136 mmol/L (ref 135–145)
TCO2: 23 mmol/L (ref 0–100)

## 2016-07-29 LAB — MAGNESIUM
Magnesium: 3 mg/dL — ABNORMAL HIGH (ref 1.7–2.4)
Magnesium: 1.7 mg/dL (ref 1.7–2.4)

## 2016-07-29 LAB — CREATININE, SERUM
Creatinine, Ser: 1.55 mg/dL — ABNORMAL HIGH (ref 0.44–1.00)
GFR calc Af Amer: 43 mL/min — ABNORMAL LOW (ref >60)
GFR calc non Af Amer: 37 mL/min — ABNORMAL LOW (ref >60)

## 2016-07-29 MED ORDER — INSULIN DETEMIR 100 UNIT/ML ~~LOC~~ SOLN
20.0000 [IU] | Freq: Every day | SUBCUTANEOUS | Status: DC
  Administered 2016-07-30: 20 [IU] via SUBCUTANEOUS
  Filled 2016-07-29: qty 0.2

## 2016-07-29 MED ORDER — FUROSEMIDE 10 MG/ML IJ SOLN
40.0000 mg | Freq: Once | INTRAMUSCULAR | Status: AC
  Administered 2016-07-29: 40 mg via INTRAVENOUS
  Filled 2016-07-29: qty 4

## 2016-07-29 MED ORDER — INSULIN DETEMIR 100 UNIT/ML ~~LOC~~ SOLN
20.0000 [IU] | Freq: Once | SUBCUTANEOUS | Status: AC
  Administered 2016-07-29: 20 [IU] via SUBCUTANEOUS
  Filled 2016-07-29: qty 0.2

## 2016-07-29 MED ORDER — INSULIN ASPART 100 UNIT/ML ~~LOC~~ SOLN
0.0000 [IU] | SUBCUTANEOUS | Status: DC
  Administered 2016-07-29: 12 [IU] via SUBCUTANEOUS
  Administered 2016-07-29: 4 [IU] via SUBCUTANEOUS
  Administered 2016-07-29: 12 [IU] via SUBCUTANEOUS
  Administered 2016-07-30 (×2): 8 [IU] via SUBCUTANEOUS

## 2016-07-29 NOTE — Progress Notes (Addendum)
[address]       [place] [address]             [phone]      1 Day Post-Op Procedure(s) (LRB): CORONARY ARTERY BYPASS GRAFTING (CABG)x4 using left internal mammary and endoscopic harvest of right greater saphenous vein (N/A) TRANSESOPHAGEAL ECHOCARDIOGRAM (TEE) (N/A)   Subjective:  Complains of some mild chest soreness.  States she is otherwise okay.  She has had some sips of water and denies nausea, vomiting.  Objective: Vital signs in last 24 hours: Temp:  [95.5 F (35.3 C)-98.6 F (37 C)] 97.9 F (36.6 C) (11/18 1000) Pulse Rate:  [77-96] 86 (11/18 1000) Cardiac Rhythm: Normal sinus rhythm (11/18 0800) Resp:  [0-33] 27 (11/18 1000) BP: (89-132)/(52-77) 132/68 (11/18 1000) SpO2:  [94 %-100 %] 97 % (11/18 1000) Arterial Line BP: (101-151)/(30-68) 151/54 (11/18 1000) FiO2 (%):  [40 %-100 %] 100 % (11/18 0800) Weight:  [250 lb 10.6 oz (113.7 kg)] 250 lb 10.6 oz (113.7 kg) (11/18 0600)  Hemodynamic parameters for last 24 hours: PAP: (25-42)/(8-26) 30/9 CO:  [4.1 L/min-5.6 L/min] 5.6 L/min CI:  [1.9 L/min/m2-2.7 L/min/m2] 2.7 L/min/m2  Intake/Output from previous day: 11/17 0701 - 11/18 0700 In: 5091 [P.O.:100; I.V.:3240; Blood:871; NG/GT:30; IV Piggyback:850] Out: [id number] [Urine:3305; Blood:1625; Chest Tube:600] Intake/Output this shift: Total I/O In: 530.7 [P.O.:120; I.V.:210.7; IV Piggyback:200] Out: 175 [Urine:155; Chest Tube:20]  General appearance: alert, cooperative and no distress Heart: regular rate and rhythm Lungs: diminished breath sounds bibasilar Abdomen: soft, non-tender; bowel sounds normal; no masses,  no organomegaly Extremities: edema 1+ Wound: clean and dry  Lab Results:  Recent Labs  07/28/16 2010 07/28/16 2020 07/29/16 0420  WBC 11.2*  --  12.4*  HGB 8.1* 8.2* 8.3*  HCT 25.2* 24.0* 25.3*  PLT 160  --  169   BMET:  Recent Labs  07/28/16 0537  07/28/16 2020 07/29/16 0420  NA 137  < > 141 140  K 4.2  < >  4.0 4.0  CL 106  < > 105 108  CO2 25  --   --  24  GLUCOSE 241*  < > 111* 109*  BUN 24*  < > 19 21*  CREATININE 1.26*  < > 1.20* 1.45*  CALCIUM 8.2*  --   --  7.6*  < > = values in this interval not displayed.  PT/INR:  Recent Labs  07/28/16 1446  LABPROT 15.8*  INR 1.25   ABG    Component Value Date/Time   PHART 7.379 07/29/2016 0809   HCO3 23.3 07/29/2016 0809   TCO2 25 07/29/2016 0809   ACIDBASEDEF 2.0 07/29/2016 0809   O2SAT 97.0 07/29/2016 0809   CBG (last 3)   Recent Labs  07/29/16 0559 07/29/16 0652 07/29/16 0806  GLUCAP 104* 110* 116*    Assessment/Plan: S/P Procedure(s) (LRB): CORONARY ARTERY BYPASS GRAFTING (CABG)x4 using left internal mammary and endoscopic harvest of right greater saphenous vein (N/A) TRANSESOPHAGEAL ECHOCARDIOGRAM (TEE) (N/A)  1. CV- NSR, BP okay- wean Milrinone as tolerated, continue IV Amiodarone for now 2. Pulm- d/c chest tubes, aggressive pulm toilet, wean oxygen as tolerated 3. Renal- creatinine at 1.45 weight is up 20 lbs, will start Lasix 4. Expected post operative blood loss, moderate Hgb 8.3 will monitor 5. DM- uncontrolled preoperative, will wean insulin drip start Levemir 6. Dispo- patient stable, wean milrinone as tolerated, POD #1 progression orders   LOS: 5 days    BARRETT, ERIN 07/29/2016  I have  seen and examined the patient and agree with the assessment and plan as outlined. Overall doing reasonably well POD1 Mobilize Diuresis Watch UOP D/C tubes and lines  Rexene Alberts, MD 07/29/2016 10:30 AM

## 2016-07-29 NOTE — Op Note (Signed)
NAMETALENE, PICCIONE NO.:  [id number]  MEDICAL RECORD NO.:  [id number]  LOCATION:  2S10C                        FACILITY:  [facility]  PHYSICIAN:  Ivin Poot, M.D.  DATE OF BIRTH:  May 05, 1961  DATE OF PROCEDURE: DATE OF DISCHARGE:                              OPERATIVE REPORT   OPERATION: 1. Coronary artery bypass grafting x4 (left internal mammary artery to     left anterior descending, saphenous vein graft to diagonal,     saphenous vein graft to obtuse marginal 1, saphenous vein graft to     distal circumflex). 2. Endoscopic harvest of right leg greater saphenous vein.  SURGEON:  Ivin Poot, MD.  ASSISTANT:  Nicholes Rough, PA-C.  PREOPERATIVE DIAGNOSES:  Severe 3-vessel coronary artery disease, unstable angina, and non-ST-elevation myocardial infarction.  POSTOPERATIVE DIAGNOSES:  Severe 3-vessel coronary artery disease, unstable angina, and non-ST-elevation myocardial infarction.  ANESTHESIA:  General by Crissie Sickles Conrad [place]ina, M.D.  CLINICAL NOTE:  The patient is a morbidly obese 55 year old diabetic, who presented to the hospital with chest pain and positive cardiac enzymes.  Eight weeks ago, she was hospitalized with bilateral pulmonary emboli and had been on Xarelto.  Echocardiogram demonstrated mild RV dilatation, but no significant LV dysfunction or valvular disease. Cardiac catheterization, however, demonstrated severe diffuse 3-vessel coronary artery disease with a small nondominant right coronary, 90% stenosis of the LAD diagonal, 90% stenosis of the circumflex marginals. The LVEDP was elevated at 27.  She was not felt to be a good candidate for percutaneous intervention due to her diabetic pattern of disease and surgical revascularization was recommended.  I examined the patient in her hospital room and reviewed the results of the cardiac catheterization and echo with the patient and family.  I discussed the indications and expected benefits  of coronary bypass grafting for treatment of her severe coronary artery disease.  I discussed the major aspects of the operation including the use of general anesthesia and cardiopulmonary bypass, the expected hospital recovery, location, the incisions, and the potential risk.  We discussed the risks of stroke, bleeding, blood transfusion requirement, infection, postoperative pulmonary problems including pleural effusion, postoperative infection, and death.  She understood that she was at increased risk for pulmonary and cardiovascular complication due to her recent history of pulmonary emboli.  A preoperative V/Q scan was performed, which demonstrated persistent thromboembolic disease of the lower lobe vessels bilaterally on her lung perfusion.  A CTA was not performed because of an elevated creatinine post cath.  After reviewing all these issues, the patient demonstrated her understanding and agreed to proceed with surgery under what I felt was an informed consent.  OPERATIVE FINDINGS: 1. Adequate conduit. 2. Heavily diseased vessels, suboptimal targets, but adequate for     grafting. 3. Preserved LV function after separation from cardiopulmonary bypass     by TEE.  OPERATIVE PROCEDURE:  The patient was brought to the operating room and placed supine on the operating table.  General anesthesia was induced under invasive hemodynamic monitoring.  The chest, abdomen, and legs were prepped with Betadine and draped as a sterile field.  A sternal incision was made after proper time-out.  The vein  was harvested endoscopically from the right leg.  The internal mammary artery was harvested as a pedicle graft from its origin at the subclavian vessels.  The sternal retractor was placed using the deep blades due to the patient's obese body habitus.  The pericardium was opened and suspended. Pursestrings were placed in the ascending aorta and right atrium and when the heparin was administered  and the ACT was documented as being therapeutic, the patient was cannulated and placed on bypass.  The coronaries were identified for grafting.  The OM1 and distal circumflex vessels were poor targets, but graftable.  The LAD was heavily and diffusely diseased and graftable.  The diagonal was also small but heavily diseased, but graftable.  Cardioplegia cannulas were placed both antegrade and retrograde cold blood cardioplegia and the patient was cooled to 32 degrees.  The aortic crossclamp was applied and a liter of cold blood cardioplegia was delivered in split doses between the antegrade aortic and retrograde coronary sinus catheters.  There was good cardioplegic arrest and septal temperature dropped less than 12 degrees.  Cardioplegia was delivered every 20 minutes.  The distal coronary anastomoses were performed.  The first distal anastomosis was to the distal circumflex.  This was heavily diseased 1.2- mm vessel.  A reverse saphenous vein was sewn end-to-side with running 7- 0 Prolene with good flow through the graft.  Cardioplegia was redosed.  The second distal anastomosis was to the OM1 branch of the left circumflex.  This was heavily calcified proximally with a 90% stenosis. It was intramyocardial and the anastomosis was placed fairly proximally under the atrial appendage.  It was 1.4-mm vessel.  A reverse saphenous vein was sewn end-to-side with running 7-0 Prolene with good flow through graft.  Cardioplegia was redosed.  The third distal anastomosis was to the first diagonal branch of the LAD.  This had a proximal 70% stenosis.  A reverse saphenous vein was sewn end-to-side with running 7-0 Prolene.  There was good flow through graft.  The fourth distal anastomosis was to the mid to distal third of the LAD. It was 1.5-mm vessel.  There was a proximal 90% stenosis.  The left IMA pedicle was brought through an opening in the left lateral pericardium, was brought down onto  the LAD and sewn end-to-side with a running 8-0 Prolene.  There was excellent flow through the anastomosis after briefly releasing the pedicle bulldog on the mammary artery.  The bulldog was reapplied.  The pedicle was secured to the epicardium with 6-0 Prolene. Cardioplegia was redosed.  While the crossclamp was still in place, 3 proximal vein anastomoses were performed on the ascending aorta with a 4.5 mm punch running 6-0 Prolene.  Prior to tying down the final proximal anastomosis, air was vented from the coronaries with a dose of retrograde warm blood cardioplegia.  The crossclamp was removed.  The vein grafts were de-aired and opened and each had good flow. Hemostasis was documented at the proximal distal anastomoses.  The heart was cardioverted back to a regular rhythm.  The patient was rewarmed and reperfused.  Temporary pacing wires were applied.  The lungs were expanded.  The ventilator was resumed.  The patient was started on low- dose milrinone and then weaned off cardiopulmonary bypass without difficulty.  Echo showed preserved LV systolic function.  Protamine was administered without adverse reaction.  The cannulas were removed and the cardioplegia cannula was removed.  The patient remained stable after reversal of heparin with protamine without adverse reaction.  The superior pericardial fat was closed over the aorta.  An anterior mediastinal left pleural chest tube were placed and brought out through separate incisions.  The sternum was closed with wire.  The pectoralis fascia was closed with a running #1 Vicryl.  Subcutaneous and skin layers were closed in running Vicryl and sterile dressings were applied.  Total cardiopulmonary bypass time was 150 minutes.     Ivin Poot, M.D.     PV/MEDQ  D:  07/28/2016  T:  07/29/2016  Job:  [id number]  cc:   Gasper Hopes M. [place], M.D.

## 2016-07-30 ENCOUNTER — Inpatient Hospital Stay (HOSPITAL_COMMUNITY): Payer: Medicare Other

## 2016-07-30 LAB — BASIC METABOLIC PANEL
Anion gap: 10 (ref 5–15)
BUN: 22 mg/dL — AB (ref 6–20)
CO2: 21 mmol/L — ABNORMAL LOW (ref 22–32)
Calcium: 7.7 mg/dL — ABNORMAL LOW (ref 8.9–10.3)
Chloride: 102 mmol/L (ref 101–111)
Creatinine, Ser: 1.74 mg/dL — ABNORMAL HIGH (ref 0.44–1.00)
GFR, EST AFRICAN AMERICAN: 37 mL/min — AB (ref >60)
GFR, EST NON AFRICAN AMERICAN: 32 mL/min — AB (ref >60)
Glucose, Bld: 251 mg/dL — ABNORMAL HIGH (ref 65–99)
POTASSIUM: 3.9 mmol/L (ref 3.5–5.1)
SODIUM: 133 mmol/L — AB (ref 135–145)

## 2016-07-30 LAB — GLUCOSE, CAPILLARY
Glucose-Capillary: 228 mg/dL — ABNORMAL HIGH (ref 65–99)
Glucose-Capillary: 213 mg/dL — ABNORMAL HIGH (ref 65–99)
Glucose-Capillary: 385 mg/dL — ABNORMAL HIGH (ref 65–99)
Glucose-Capillary: 356 mg/dL — ABNORMAL HIGH (ref 65–99)
Glucose-Capillary: 253 mg/dL — ABNORMAL HIGH (ref 65–99)

## 2016-07-30 LAB — CBC
HEMATOCRIT: 24.9 % — AB (ref 36.0–46.0)
Hemoglobin: 8 g/dL — ABNORMAL LOW (ref 12.0–15.0)
MCH: 28.4 pg (ref 26.0–34.0)
MCHC: 32.1 g/dL (ref 30.0–36.0)
MCV: 88.3 fL (ref 78.0–100.0)
PLATELETS: 171 10*3/uL (ref 150–400)
RBC: 2.82 MIL/uL — ABNORMAL LOW (ref 3.87–5.11)
RDW: 14 % (ref 11.5–15.5)
WBC: 15.7 10*3/uL — AB (ref 4.0–10.5)

## 2016-07-30 MED ORDER — SODIUM CHLORIDE 0.9% FLUSH
3.0000 mL | Freq: Two times a day (BID) | INTRAVENOUS | Status: DC
  Administered 2016-07-30 – 2016-08-04 (×4): 3 mL via INTRAVENOUS

## 2016-07-30 MED ORDER — INSULIN ASPART 100 UNIT/ML ~~LOC~~ SOLN
0.0000 [IU] | Freq: Three times a day (TID) | SUBCUTANEOUS | Status: DC
  Administered 2016-07-30: 11 [IU] via SUBCUTANEOUS
  Administered 2016-07-30: 20 [IU] via SUBCUTANEOUS
  Administered 2016-07-31 (×2): 7 [IU] via SUBCUTANEOUS
  Administered 2016-07-31: 4 [IU] via SUBCUTANEOUS

## 2016-07-30 MED ORDER — SODIUM CHLORIDE 0.9% FLUSH: 3.0000 mL | INTRAVENOUS | Status: DC | PRN

## 2016-07-30 MED ORDER — POTASSIUM CHLORIDE CRYS ER 20 MEQ PO TBCR
20.0000 meq | EXTENDED_RELEASE_TABLET | Freq: Two times a day (BID) | ORAL | Status: DC
  Administered 2016-07-30 – 2016-08-01 (×6): 20 meq via ORAL
  Filled 2016-07-30 (×6): qty 1

## 2016-07-30 MED ORDER — FUROSEMIDE 10 MG/ML IJ SOLN
40.0000 mg | Freq: Two times a day (BID) | INTRAMUSCULAR | Status: DC
  Administered 2016-07-30 – 2016-08-02 (×6): 40 mg via INTRAVENOUS
  Filled 2016-07-30 (×5): qty 4

## 2016-07-30 MED ORDER — MOVING RIGHT ALONG BOOK
Freq: Once | Status: AC
  Administered 2016-07-30: 10:00:00
  Filled 2016-07-30: qty 1

## 2016-07-30 MED ORDER — INSULIN DETEMIR 100 UNIT/ML ~~LOC~~ SOLN
28.0000 [IU] | Freq: Two times a day (BID) | SUBCUTANEOUS | Status: DC
  Administered 2016-07-30: 28 [IU] via SUBCUTANEOUS
  Filled 2016-07-30 (×2): qty 0.28

## 2016-07-30 MED ORDER — SODIUM CHLORIDE 0.9 % IV SOLN
250.0000 mL | INTRAVENOUS | Status: DC | PRN
  Administered 2016-07-31: 10 mL via INTRAVENOUS

## 2016-07-30 MED ORDER — METOPROLOL TARTRATE 25 MG PO TABS
25.0000 mg | ORAL_TABLET | Freq: Two times a day (BID) | ORAL | Status: DC
  Administered 2016-07-30 – 2016-08-07 (×16): 25 mg via ORAL
  Filled 2016-07-30 (×16): qty 1

## 2016-07-30 MED ORDER — AMIODARONE HCL 200 MG PO TABS
400.0000 mg | ORAL_TABLET | Freq: Two times a day (BID) | ORAL | Status: DC
  Administered 2016-07-30 – 2016-08-04 (×12): 400 mg via ORAL
  Filled 2016-07-30 (×12): qty 2

## 2016-07-30 NOTE — Progress Notes (Signed)
T[facility] BRIEF SICU PROGRESS NOTE  2 Days Post-Op  S/P Procedure(s) (LRB): CORONARY ARTERY BYPASS GRAFTING (CABG)x4 using left internal mammary and endoscopic harvest of right greater saphenous vein (N/A) TRANSESOPHAGEAL ECHOCARDIOGRAM (TEE) (N/A)   Stable day Maintaining NSR w/ improved BP Breathing comfortably w/ O2 sats 91-93% on 6 L/min UOP adequate  Plan: Continue current plan  Rexene Alberts, MD 07/30/2016 5:13 PM

## 2016-07-30 NOTE — Progress Notes (Addendum)
[address]       [place] [address]             [phone]        CARDIOTHORACIC SURGERY PROGRESS NOTE   R2 Days Post-Op Procedure(s) (LRB): CORONARY ARTERY BYPASS GRAFTING (CABG)x4 using left internal mammary and endoscopic harvest of right greater saphenous vein (N/A) TRANSESOPHAGEAL ECHOCARDIOGRAM (TEE) (N/A)  Subjective: Feels better today.  Slept some.   Denies SOB.  No appetite  Objective: Vital signs: BP Readings from Last 1 Encounters:  07/30/16 (!) 152/68   Pulse Readings from Last 1 Encounters:  07/30/16 84   Resp Readings from Last 1 Encounters:  07/30/16 (!) 25   Temp Readings from Last 1 Encounters:  07/30/16 97.5 F (36.4 C) (Oral)    Hemodynamics: PAP: (23-30)/(2-9) 23/2  Physical Exam:  Rhythm:   sinus  Breath sounds: clear  Heart sounds:  RRR   Incisions:  Dressing dry, intact  Abdomen:  Soft, non-distended, non-tender  Extremities:  Warm, well-perfused    Intake/Output from previous day: 11/18 0701 - 11/19 0700 In: 2128.2 [P.O.:360; I.V.:1218.2; IV Piggyback:550] Out: 1510 [Urine:1430; Chest Tube:80] Intake/Output this shift: Total I/O In: 292.6 [I.V.:92.6; IV Piggyback:200] Out: 100 [Urine:100]  Lab Results:  CBC: Recent Labs  07/29/16 1645 07/29/16 1648 07/30/16 0337  WBC 13.6*  --  15.7*  HGB 8.3* 8.5* 8.0*  HCT 26.0* 25.0* 24.9*  PLT 176  --  171    BMET:  Recent Labs  07/29/16 0420  07/29/16 1648 07/30/16 0337  NA 140  --  136 133*  K 4.0  --  4.6 3.9  CL 108  --  101 102  CO2 24  --   --  21*  GLUCOSE 109*  --  233* 251*  BUN 21*  --  22* 22*  CREATININE 1.45*  < > 1.40* 1.74*  CALCIUM 7.6*  --   --  7.7*  < > = values in this interval not displayed.   PT/INR:   Recent Labs  07/28/16 1446  LABPROT 15.8*  INR 1.25    CBG (last 3)   Recent Labs  07/29/16 2343 07/30/16 0404 07/30/16 0805  GLUCAP 287* 228* 213*    ABG    Component Value Date/Time   PHART 7.379  07/29/2016 0809   PCO2ART 39.4 07/29/2016 0809   PO2ART 91.0 07/29/2016 0809   HCO3 23.3 07/29/2016 0809   TCO2 23 07/29/2016 1648   ACIDBASEDEF 2.0 07/29/2016 0809   O2SAT 97.0 07/29/2016 0809    CXR: PORTABLE CHEST 1 VIEW  COMPARISON:  07/29/2016  FINDINGS: Interval removal of left chest tube and Swan-Ganz catheter. No pneumothorax. Low lung volumes. Diffuse severe bilateral airspace disease, likely edema. Suspect small layering effusions. Stable cardiomegaly.  IMPRESSION: Continued diffuse bilateral airspace disease, likely edema. Suspect small layering effusions.  No pneumothorax following removal of left chest tube.   Electronically Signed   By: Rolm Baptise M.D.   On: 07/30/2016 07:51  Assessment/Plan: S/P Procedure(s) (LRB): CORONARY ARTERY BYPASS GRAFTING (CABG)x4 using left internal mammary and endoscopic harvest of right greater saphenous vein (N/A) TRANSESOPHAGEAL ECHOCARDIOGRAM (TEE) (N/A)  Overall doing well POD2 Maintaining NSR w/ stable BP on low dose milrinone and amiodarone drips, BP trending up Breathing comfortably w/ O2 sats 91-95% on 6 L/min via  Expected post op acute blood loss anemia, Hgb 8.0 stable Chronic systolic CHF with expected post-op volume excess, weight 20 lbs above baseline AKI w/ creatinine  trending up, non-oliguric, likely due to ATN and prerenal azotemia caused by surgery Leukocytosis w/out fever, likely reactive Recent history of bilateral PE's Type II diabetes mellitus, CBG's trending up Essential hypertension Morbid obesity   Mobilize  Diuresis  Watch renal function  Watch anemia and WBC  Wean milrinone off  Convert amiodarone to oral  Restart full dose anticoagulation tomorrow per original plan  Increase levemir insulin and change CBG's and SSI to ac/hs  Rexene Alberts, MD 07/30/2016 9:48 AM

## 2016-07-31 ENCOUNTER — Encounter (HOSPITAL_COMMUNITY): Payer: Self-pay | Admitting: Cardiothoracic Surgery

## 2016-07-31 ENCOUNTER — Inpatient Hospital Stay (HOSPITAL_COMMUNITY): Payer: Medicare Other

## 2016-07-31 LAB — TYPE AND SCREEN
ABO/RH(D): B POS
Antibody Screen: NEGATIVE
Unit division: 0
Unit division: 0
Unit division: 0
Unit division: 0

## 2016-07-31 LAB — GLUCOSE, CAPILLARY
GLUCOSE-CAPILLARY: 200 mg/dL — AB (ref 65–99)
Glucose-Capillary: 191 mg/dL — ABNORMAL HIGH (ref 65–99)
Glucose-Capillary: 225 mg/dL — ABNORMAL HIGH (ref 65–99)
Glucose-Capillary: 227 mg/dL — ABNORMAL HIGH (ref 65–99)

## 2016-07-31 LAB — POCT I-STAT, CHEM 8
BUN: 28 mg/dL — ABNORMAL HIGH (ref 6–20)
CREATININE: 1.5 mg/dL — AB (ref 0.44–1.00)
Calcium, Ion: 1.12 mmol/L — ABNORMAL LOW (ref 1.15–1.40)
Chloride: 98 mmol/L — ABNORMAL LOW (ref 101–111)
Glucose, Bld: 212 mg/dL — ABNORMAL HIGH (ref 65–99)
HEMATOCRIT: 29 % — AB (ref 36.0–46.0)
HEMOGLOBIN: 9.9 g/dL — AB (ref 12.0–15.0)
Potassium: 4 mmol/L (ref 3.5–5.1)
SODIUM: 135 mmol/L (ref 135–145)
TCO2: 27 mmol/L (ref 0–100)

## 2016-07-31 LAB — PREPARE RBC (CROSSMATCH)

## 2016-07-31 LAB — CBC
HEMATOCRIT: 23.8 % — AB (ref 36.0–46.0)
HEMOGLOBIN: 7.6 g/dL — AB (ref 12.0–15.0)
MCH: 28.1 pg (ref 26.0–34.0)
MCHC: 31.9 g/dL (ref 30.0–36.0)
MCV: 88.1 fL (ref 78.0–100.0)
PLATELETS: 186 10*3/uL (ref 150–400)
RBC: 2.7 MIL/uL — AB (ref 3.87–5.11)
RDW: 14.1 % (ref 11.5–15.5)
WBC: 15.7 10*3/uL — ABNORMAL HIGH (ref 4.0–10.5)

## 2016-07-31 LAB — BASIC METABOLIC PANEL
ANION GAP: 9 (ref 5–15)
BUN: 27 mg/dL — AB (ref 6–20)
CHLORIDE: 99 mmol/L — AB (ref 101–111)
CO2: 23 mmol/L (ref 22–32)
Calcium: 7.9 mg/dL — ABNORMAL LOW (ref 8.9–10.3)
Creatinine, Ser: 1.89 mg/dL — ABNORMAL HIGH (ref 0.44–1.00)
GFR calc Af Amer: 34 mL/min — ABNORMAL LOW (ref >60)
GFR calc non Af Amer: 29 mL/min — ABNORMAL LOW (ref >60)
GLUCOSE: 212 mg/dL — AB (ref 65–99)
POTASSIUM: 4.6 mmol/L (ref 3.5–5.1)
Sodium: 131 mmol/L — ABNORMAL LOW (ref 135–145)

## 2016-07-31 MED ORDER — COUMADIN BOOK
Freq: Once | Status: AC
  Administered 2016-07-31: 12:00:00
  Filled 2016-07-31: qty 1

## 2016-07-31 MED ORDER — LOPERAMIDE HCL 1 MG/5ML PO LIQD
2.0000 mg | Freq: Two times a day (BID) | ORAL | Status: DC
  Administered 2016-07-31 – 2016-08-01 (×2): 2 mg via ORAL
  Filled 2016-07-31 (×3): qty 10

## 2016-07-31 MED ORDER — METOLAZONE 5 MG PO TABS
5.0000 mg | ORAL_TABLET | Freq: Every day | ORAL | Status: DC
  Administered 2016-07-31 – 2016-08-01 (×2): 5 mg via ORAL
  Filled 2016-07-31 (×2): qty 1

## 2016-07-31 MED ORDER — SODIUM CHLORIDE 0.9% FLUSH
10.0000 mL | INTRAVENOUS | Status: DC | PRN
  Administered 2016-08-03 – 2016-08-04 (×2): 10 mL
  Filled 2016-07-31 (×2): qty 40

## 2016-07-31 MED ORDER — WARFARIN - PHYSICIAN DOSING INPATIENT: Freq: Every day | Status: DC

## 2016-07-31 MED ORDER — SIMETHICONE 80 MG PO CHEW
80.0000 mg | CHEWABLE_TABLET | Freq: Four times a day (QID) | ORAL | Status: DC
  Administered 2016-07-31 – 2016-08-07 (×27): 80 mg via ORAL
  Filled 2016-07-31 (×27): qty 1

## 2016-07-31 MED ORDER — SODIUM CHLORIDE 0.9% FLUSH
10.0000 mL | Freq: Two times a day (BID) | INTRAVENOUS | Status: DC
  Administered 2016-07-31: 20 mL
  Administered 2016-07-31: 10 mL
  Administered 2016-08-01 – 2016-08-02 (×2): 20 mL

## 2016-07-31 MED ORDER — WARFARIN SODIUM 5 MG PO TABS
5.0000 mg | ORAL_TABLET | Freq: Once | ORAL | Status: AC
  Administered 2016-07-31: 5 mg via ORAL
  Filled 2016-07-31: qty 1

## 2016-07-31 MED ORDER — PHENAZOPYRIDINE HCL 100 MG PO TABS
100.0000 mg | ORAL_TABLET | Freq: Three times a day (TID) | ORAL | Status: DC
  Administered 2016-07-31 – 2016-08-04 (×11): 100 mg via ORAL
  Filled 2016-07-31 (×12): qty 1

## 2016-07-31 MED ORDER — FE FUMARATE-B12-VIT C-FA-IFC PO CAPS
1.0000 | ORAL_CAPSULE | Freq: Three times a day (TID) | ORAL | Status: DC
  Administered 2016-07-31 – 2016-08-07 (×23): 1 via ORAL
  Filled 2016-07-31 (×22): qty 1

## 2016-07-31 MED ORDER — INSULIN DETEMIR 100 UNIT/ML ~~LOC~~ SOLN
35.0000 [IU] | Freq: Two times a day (BID) | SUBCUTANEOUS | Status: DC
  Administered 2016-07-31 – 2016-08-02 (×6): 35 [IU] via SUBCUTANEOUS
  Filled 2016-07-31 (×9): qty 0.35

## 2016-07-31 MED ORDER — DOPAMINE-DEXTROSE 3.2-5 MG/ML-% IV SOLN
2.0000 ug/kg/min | INTRAVENOUS | Status: DC
  Administered 2016-07-31: 2 ug/kg/min via INTRAVENOUS
  Filled 2016-07-31: qty 250

## 2016-07-31 MED ORDER — PANTOPRAZOLE SODIUM 40 MG PO TBEC
40.0000 mg | DELAYED_RELEASE_TABLET | Freq: Two times a day (BID) | ORAL | Status: DC
  Administered 2016-07-31 – 2016-08-07 (×14): 40 mg via ORAL
  Filled 2016-07-31 (×14): qty 1

## 2016-07-31 MED ORDER — FENTANYL CITRATE (PF) 100 MCG/2ML IJ SOLN: 25.0000 ug | INTRAMUSCULAR | Status: DC | PRN

## 2016-07-31 MED FILL — Sodium Bicarbonate IV Soln 8.4%: INTRAVENOUS | Qty: 50 | Status: AC

## 2016-07-31 MED FILL — Sodium Chloride IV Soln 0.9%: INTRAVENOUS | Qty: 2000 | Status: AC

## 2016-07-31 MED FILL — Albumin, Human Inj 5%: INTRAVENOUS | Qty: 250 | Status: AC

## 2016-07-31 MED FILL — Heparin Sodium (Porcine) Inj 1000 Unit/ML: INTRAMUSCULAR | Qty: 40 | Status: AC

## 2016-07-31 MED FILL — Electrolyte-R (PH 7.4) Solution: INTRAVENOUS | Qty: 5000 | Status: AC

## 2016-07-31 MED FILL — Mannitol IV Soln 20%: INTRAVENOUS | Qty: 500 | Status: AC

## 2016-07-31 MED FILL — Lidocaine HCl IV Inj 20 MG/ML: INTRAVENOUS | Qty: 5 | Status: AC

## 2016-07-31 NOTE — Significant Event (Signed)
Patient complaining of discomfort from foley catheter that was inserted today by foley team. No other complaints at the time. RN noted that foley was latex. Patient wanted catheter out. Patient voided after catheter was removed.

## 2016-07-31 NOTE — Progress Notes (Signed)
3 Days Post-Op Procedure(s) (LRB): CORONARY ARTERY BYPASS GRAFTING (CABG)x4 using left internal mammary and endoscopic harvest of right greater saphenous vein (N/A) TRANSESOPHAGEAL ECHOCARDIOGRAM (TEE) (N/A) Subjective: Wt up 15 lbs hb 7.5 6 L O2 , hx PE 2 months ago Objective: Vital signs in last 24 hours: Temp:  [97.5 F (36.4 C)-98.7 F (37.1 C)] 97.8 F (36.6 C) (11/20 0400) Pulse Rate:  [69-123] 74 (11/20 0730) Cardiac Rhythm: Normal sinus rhythm (11/20 0400) Resp:  [14-28] 16 (11/20 0730) BP: (108-162)/(56-78) 137/78 (11/20 0700) SpO2:  [90 %-97 %] 97 % (11/20 0730) Weight:  [252 lb 13.9 oz (114.7 kg)] 252 lb 13.9 oz (114.7 kg) (11/20 0500)  Hemodynamic parameters for last 24 hours:  nsr  Intake/Output from previous day: 11/19 0701 - 11/20 0700 In: 841.5 [P.O.:400; I.V.:241.5; IV Piggyback:200] Out: 995 [Urine:995] Intake/Output this shift: No intake/output data recorded.       Exam    General- alert and comfortable   Lungs- clear without rales, wheezes   Cor- regular rate and rhythm, no murmur , gallop   Abdomen- soft, non-tender   Extremities - warm, non-tender, minimal edema   Neuro- oriented, appropriate, no focal weakness   Lab Results:  Recent Labs  07/30/16 0337 07/31/16 0242  WBC 15.7* 15.7*  HGB 8.0* 7.6*  HCT 24.9* 23.8*  PLT 171 186   BMET:  Recent Labs  07/30/16 0337 07/31/16 0242  NA 133* 131*  K 3.9 4.6  CL 102 99*  CO2 21* 23  GLUCOSE 251* 212*  BUN 22* 27*  CREATININE 1.74* 1.89*  CALCIUM 7.7* 7.9*    PT/INR:  Recent Labs  07/28/16 1446  LABPROT 15.8*  INR 1.25   ABG    Component Value Date/Time   PHART 7.379 07/29/2016 0809   HCO3 23.3 07/29/2016 0809   TCO2 23 07/29/2016 1648   ACIDBASEDEF 2.0 07/29/2016 0809   O2SAT 97.0 07/29/2016 0809   CBG (last 3)   Recent Labs  07/30/16 1357 07/30/16 1652 07/30/16 2137  GLUCAP 356* 253* 191*    Assessment/Plan: S/P Procedure(s) (LRB): CORONARY ARTERY BYPASS  GRAFTING (CABG)x4 using left internal mammary and endoscopic harvest of right greater saphenous vein (N/A) TRANSESOPHAGEAL ECHOCARDIOGRAM (TEE) (N/A) Start coumadin Add metolazone to lasix Renal dose dopeamine thru PICC  start po iron Replace foley  LOS: 7 days    Tharon Aquas Trigt III 07/31/2016

## 2016-07-31 NOTE — Progress Notes (Signed)
Peripherally Inserted Central Catheter/Midline Placement  The IV Nurse has discussed with the patient and/or persons authorized to consent for the patient, the purpose of this procedure and the potential benefits and risks involved with this procedure.  The benefits include less needle sticks, lab draws from the catheter, and the patient may be discharged home with the catheter. Risks include, but not limited to, infection, bleeding, blood clot (thrombus formation), and puncture of an artery; nerve damage and irregular heartbeat and possibility to perform a PICC exchange if needed/ordered by physician.  Alternatives to this procedure were also discussed.  Bard Power PICC patient education guide, fact sheet on infection prevention and patient information card has been provided to patient /or left at bedside.    PICC/Midline Placement Documentation        Synthia Innocent 07/31/2016, 9:53 AM

## 2016-07-31 NOTE — Care Management Note (Addendum)
Case Management Note Original Note Created by Michail Jewels 07/24/16  Patient Details  Name: Tamara Henry MRN: [place]:[id number] Date of Birth: 10-19-1960  Subjective/Objective:   Pt presented fr unstable angina. Pt is from home with daughter. Pt is currently active with [facility] and has [place] RN, PT, OT, ST. Pt will need resumption orders if the plan continues to be home once stable. Linne with [facility] is aware that pt is hospitalized.             Action/Plan: CM did speak with pt in regards to DME. Pt states she will benefit from a new RW with [facility]. CM will continue to monitor for additional needs.   Expected Discharge Date:                  Expected Discharge Plan:  [place]  In-House Referral:  NA  Discharge planning Services  CM Consult  Post Acute Care Choice:  Home Health, Resumption of Svcs/PTA Provider Choice offered to:  Patient  DME Arranged:    DME Agency:     HH Arranged: RN, PT, OT ST  HH Agency:  [facility]  Status of Service:  In process, will continue to follow  If discussed at Long Length of Stay Meetings, dates discussed:    Additional Comments: 07/31/2016 Pt is now 3 days s/p CABG, remains positive in net weight, metalzone will be added to lasix and coumadin regimen started today.  Pt not oriented - bedside nurse informed.  Family friend confirmed that PTA from home with daughter.  CM will continue to follow for discharge needs Maryclare Labrador, RN 07/31/2016, 10:43 AM

## 2016-07-31 NOTE — Care Management Important Message (Signed)
Important Message  Patient Details  Name: Tamara Henry MRN: [place]:[id number] Date of Birth: May 16, 1961   Medicare Important Message Given:  Yes    Nathen May 07/31/2016, 10:29 AM

## 2016-07-31 NOTE — Progress Notes (Signed)
Patient ID: Tamara Henry, female   DOB: [birth year], 55 y.o.   MRN: [place]:[id number]  SICU Evening Rounds  Hemodynamically stable in sinus rhythm on dop 2 mcg  Excellent urine output, creat improving.  BMET    Component Value Date/Time   NA 135 07/31/2016 1729   K 4.0 07/31/2016 1729   CL 98 (L) 07/31/2016 1729   CO2 23 07/31/2016 0242   GLUCOSE 212 (H) 07/31/2016 1729   BUN 28 (H) 07/31/2016 1729   CREATININE 1.50 (H) 07/31/2016 1729   CALCIUM 7.9 (L) 07/31/2016 0242   GFRNONAA 29 (L) 07/31/2016 0242   GFRAA 34 (L) 07/31/2016 0242

## 2016-08-01 ENCOUNTER — Inpatient Hospital Stay (HOSPITAL_COMMUNITY): Payer: Medicare Other

## 2016-08-01 LAB — TYPE AND SCREEN
ABO/RH(D): B POS
Antibody Screen: NEGATIVE
Unit division: 0

## 2016-08-01 LAB — GLUCOSE, CAPILLARY
GLUCOSE-CAPILLARY: 133 mg/dL — AB (ref 65–99)
GLUCOSE-CAPILLARY: 141 mg/dL — AB (ref 65–99)
GLUCOSE-CAPILLARY: 213 mg/dL — AB (ref 65–99)
GLUCOSE-CAPILLARY: 241 mg/dL — AB (ref 65–99)
GLUCOSE-CAPILLARY: 249 mg/dL — AB (ref 65–99)
GLUCOSE-CAPILLARY: 313 mg/dL — AB (ref 65–99)
GLUCOSE-CAPILLARY: 76 mg/dL (ref 65–99)
GLUCOSE-CAPILLARY: 93 mg/dL (ref 65–99)
Glucose-Capillary: 103 mg/dL — ABNORMAL HIGH (ref 65–99)
Glucose-Capillary: 119 mg/dL — ABNORMAL HIGH (ref 65–99)
Glucose-Capillary: 132 mg/dL — ABNORMAL HIGH (ref 65–99)
Glucose-Capillary: 190 mg/dL — ABNORMAL HIGH (ref 65–99)
Glucose-Capillary: 290 mg/dL — ABNORMAL HIGH (ref 65–99)
Glucose-Capillary: 83 mg/dL (ref 65–99)

## 2016-08-01 LAB — COMPREHENSIVE METABOLIC PANEL
ALT: 26 U/L (ref 14–54)
AST: 24 U/L (ref 15–41)
Albumin: 2.6 g/dL — ABNORMAL LOW (ref 3.5–5.0)
Alkaline Phosphatase: 133 U/L — ABNORMAL HIGH (ref 38–126)
Anion gap: 10 (ref 5–15)
BUN: 23 mg/dL — ABNORMAL HIGH (ref 6–20)
CO2: 27 mmol/L (ref 22–32)
Calcium: 8.4 mg/dL — ABNORMAL LOW (ref 8.9–10.3)
Chloride: 99 mmol/L — ABNORMAL LOW (ref 101–111)
Creatinine, Ser: 1.37 mg/dL — ABNORMAL HIGH (ref 0.44–1.00)
GFR calc Af Amer: 50 mL/min — ABNORMAL LOW (ref >60)
GFR calc non Af Amer: 43 mL/min — ABNORMAL LOW (ref >60)
Glucose, Bld: 139 mg/dL — ABNORMAL HIGH (ref 65–99)
Potassium: 3.7 mmol/L (ref 3.5–5.1)
Sodium: 136 mmol/L (ref 135–145)
Total Bilirubin: 0.5 mg/dL (ref 0.3–1.2)
Total Protein: 6.6 g/dL (ref 6.5–8.1)

## 2016-08-01 LAB — CBC
HCT: 28.4 % — ABNORMAL LOW (ref 36.0–46.0)
Hemoglobin: 9.4 g/dL — ABNORMAL LOW (ref 12.0–15.0)
MCH: 28.5 pg (ref 26.0–34.0)
MCHC: 33.1 g/dL (ref 30.0–36.0)
MCV: 86.1 fL (ref 78.0–100.0)
Platelets: 220 10*3/uL (ref 150–400)
RBC: 3.3 MIL/uL — ABNORMAL LOW (ref 3.87–5.11)
RDW: 15.5 % (ref 11.5–15.5)
WBC: 12.7 10*3/uL — ABNORMAL HIGH (ref 4.0–10.5)

## 2016-08-01 LAB — PROTIME-INR
INR: 1.09
Prothrombin Time: 14.2 seconds (ref 11.4–15.2)

## 2016-08-01 LAB — HEPARIN LEVEL (UNFRACTIONATED): Heparin Unfractionated: 0.24 IU/mL — ABNORMAL LOW (ref 0.30–0.70)

## 2016-08-01 MED ORDER — INSULIN ASPART 100 UNIT/ML ~~LOC~~ SOLN
0.0000 [IU] | SUBCUTANEOUS | Status: DC
  Administered 2016-08-01: 7 [IU] via SUBCUTANEOUS
  Administered 2016-08-01: 3 [IU] via SUBCUTANEOUS
  Administered 2016-08-01: 15 [IU] via SUBCUTANEOUS
  Administered 2016-08-02: 4 [IU] via SUBCUTANEOUS

## 2016-08-01 MED ORDER — INSULIN ASPART 100 UNIT/ML ~~LOC~~ SOLN
4.0000 [IU] | Freq: Three times a day (TID) | SUBCUTANEOUS | Status: DC
  Administered 2016-08-01: 4 [IU] via SUBCUTANEOUS

## 2016-08-01 MED ORDER — CHLORHEXIDINE GLUCONATE 0.12 % MT SOLN
15.0000 mL | Freq: Two times a day (BID) | OROMUCOSAL | Status: DC
  Administered 2016-08-01 – 2016-08-06 (×12): 15 mL via OROMUCOSAL
  Filled 2016-08-01 (×12): qty 15

## 2016-08-01 MED ORDER — INSULIN ASPART 100 UNIT/ML ~~LOC~~ SOLN: 4.0000 [IU] | Freq: Three times a day (TID) | SUBCUTANEOUS | Status: DC

## 2016-08-01 MED ORDER — HEPARIN (PORCINE) IN NACL 100-0.45 UNIT/ML-% IJ SOLN
1200.0000 [IU]/h | INTRAMUSCULAR | Status: AC
  Administered 2016-08-01: 1000 [IU]/h via INTRAVENOUS
  Administered 2016-08-02 – 2016-08-03 (×2): 1250 [IU]/h via INTRAVENOUS
  Administered 2016-08-03 – 2016-08-05 (×3): 1200 [IU]/h via INTRAVENOUS
  Filled 2016-08-01 (×6): qty 250

## 2016-08-01 MED ORDER — LOPERAMIDE HCL 2 MG PO CAPS
2.0000 mg | ORAL_CAPSULE | Freq: Two times a day (BID) | ORAL | Status: DC
  Administered 2016-08-01 – 2016-08-07 (×9): 2 mg via ORAL
  Filled 2016-08-01 (×11): qty 1

## 2016-08-01 MED ORDER — INSULIN REGULAR BOLUS VIA INFUSION
0.0000 [IU] | Freq: Three times a day (TID) | INTRAVENOUS | Status: DC
  Administered 2016-08-01 (×2): 3 [IU] via INTRAVENOUS
  Filled 2016-08-01: qty 10

## 2016-08-01 MED ORDER — POTASSIUM CHLORIDE 2 MEQ/ML IV SOLN
30.0000 meq | Freq: Once | INTRAVENOUS | Status: AC
  Administered 2016-08-01: 30 meq via INTRAVENOUS
  Filled 2016-08-01: qty 15

## 2016-08-01 MED ORDER — SODIUM CHLORIDE 0.9 % IV SOLN
INTRAVENOUS | Status: DC
  Administered 2016-08-01 (×2): via INTRAVENOUS
  Filled 2016-08-01: qty 2.5

## 2016-08-01 MED ORDER — POTASSIUM CHLORIDE 10 MEQ/50ML IV SOLN
10.0000 meq | INTRAVENOUS | Status: AC
  Administered 2016-08-01 (×2): 10 meq via INTRAVENOUS
  Filled 2016-08-01: qty 50

## 2016-08-01 NOTE — Progress Notes (Signed)
ANTICOAGULATION CONSULT NOTE - Initial Consult  Pharmacy Consult:  Heparin Indication:  Recent PE  Allergies  Allergen Reactions  . Lactose Intolerance (Gi) Diarrhea  . Lisinopril Other (See Comments) and Cough    Inflammation, coughing  . Reglan [Metoclopramide] Other (See Comments)    REACTION IS SIDE EFFECT Pt stated having lock jaw as a side effect  . Wellbutrin [Bupropion] Nausea And Vomiting and Cough  . Latex Rash  . Metformin And Related Diarrhea  . Penicillins Rash    [FROM PREVIOUS ENTRY-BEFORE 07/27/16] >>"ALL CILLINS"  Has patient had a PCN reaction causing immediate rash, facial/tongue/throat swelling, SOB or lightheadedness with hypotension: Yes Has patient had a PCN reaction causing severe rash involving mucus membranes or skin necrosis: No Has patient had a PCN reaction that required hospitalization No Has patient had a PCN reaction occurring within the last 10 years: Yes If all of the above answers are "NO", then may proceed with Cephalosporin use.   . Sulfa Antibiotics Itching  . Tape Rash    Patient Measurements: Height: 5\' 4"  (162.6 cm) Weight: 245 lb 9.6 oz (111.4 kg) IBW/kg (Calculated) : 54.7 Heparin Dosing Weight: 81 kg  Vital Signs: Temp: 97.5 F (36.4 C) (11/21 0700) Temp Source: Oral (11/21 0700) BP: 128/66 (11/21 0915) Pulse Rate: 78 (11/21 1000)  Labs:  Recent Labs  07/30/16 0337 07/31/16 0242 07/31/16 1729 08/01/16 0445  HGB 8.0* 7.6* 9.9* 9.4*  HCT 24.9* 23.8* 29.0* 28.4*  PLT 171 186  --  220  LABPROT  --   --   --  14.2  INR  --   --   --  1.09  CREATININE 1.74* 1.89* 1.50* 1.37*    Estimated Creatinine Clearance: 58 mL/min (by C-G formula based on SCr of 1.37 mg/dL (H)).   Medical History: Past Medical History:  Diagnosis Date  . Acid reflux   . Diabetes mellitus without complication ([place])   . High cholesterol   . Hx of chest tube placement right   . Hypertension   . Neuropathy ([place])   . Pneumonia       Assessment: 89 YOF with history of recent bilateral PEs started on Xarelto on 05/18/16 (last dose 07/22/16).  He underwent CABG on 07/28/16 and was started on Lovenox and Coumadin post-op.  Now transitioned to IV heparin.  Pharmacy consult to manage heparin specifies checking only one heparin level per day and to keep goal between 0.3-0.4 units/mL.   Goal of Therapy:  Heparin level 0.3-0.4 units/mL per Dr. [place] Gum Monitor platelets by anticoagulation protocol: Yes    Plan:  - Continue heparin gtt at 1000 units/hr - Check 8 hr heparin level - Daily heparin level and CBC   Celestino Ackerman D. Mina Marble, PharmD, BCPS Pager:  [phone] 08/01/2016, 11:01 AM

## 2016-08-01 NOTE — Progress Notes (Signed)
      [address]       [place] [address]             [phone]      POD # 4 CABG  Feels better this afternoon  BP 129/73   Pulse 75   Temp 98.4 F (36.9 C) (Oral)   Resp 20   Ht 5\' 4"  (1.626 m)   Wt 245 lb 9.6 oz (111.4 kg)   LMP 07/22/2016   SpO2 90%   BMI 42.16 kg/m  On dopamine at 2  Intake/Output Summary (Last 24 hours) at 08/01/16 1815 Last data filed at 08/01/16 1800  Gross per 24 hour  Intake           454.43 ml  Output             2850 ml  Net         -2395.57 ml   CBG up to 249- will add meal coverage  Continue current care  Steven C. Roxan Hockey, MD [facility] [phone]

## 2016-08-01 NOTE — Progress Notes (Signed)
4 Days Post-Op Procedure(s) (LRB): CORONARY ARTERY BYPASS GRAFTING (CABG)x4 using left internal mammary and endoscopic harvest of right greater saphenous vein (N/A) TRANSESOPHAGEAL ECHOCARDIOGRAM (TEE) (N/A) Subjective: Nausea better Renal function and urine output improved- 2 mcg dopamine Walked 250 ft with PT Hb 9 after transfuision for expected postop blood loss anemia Objective: Vital signs in last 24 hours: Temp:  [97.5 F (36.4 C)-99 F (37.2 C)] 97.5 F (36.4 C) (11/21 0700) Pulse Rate:  [69-87] 71 (11/21 0500) Cardiac Rhythm: Normal sinus rhythm (11/20 2000) Resp:  [12-33] 18 (11/21 0500) BP: (127-180)/(66-85) 127/68 (11/21 0500) SpO2:  [86 %-99 %] 92 % (11/21 0823) Weight:  [245 lb 9.6 oz (111.4 kg)] 245 lb 9.6 oz (111.4 kg) (11/21 0200)  Hemodynamic parameters for last 24 hours:  nsr  Intake/Output from previous day: 11/20 0701 - 11/21 0700 In: 611.7 [P.O.:120; I.V.:95; Blood:396.7] Out: 4150 [Urine:4150] Intake/Output this shift: No intake/output data recorded.       Exam    General- alert and comfortable   Lungs- clear without rales, wheezes   Cor- regular rate and rhythm, no murmur , gallop   Abdomen- soft, non-tender   Extremities - warm, non-tender, minimal edema   Neuro- oriented, appropriate, no focal weakness   Lab Results:  Recent Labs  07/31/16 0242 07/31/16 1729 08/01/16 0445  WBC 15.7*  --  12.7*  HGB 7.6* 9.9* 9.4*  HCT 23.8* 29.0* 28.4*  PLT 186  --  220   BMET:  Recent Labs  07/31/16 0242 07/31/16 1729 08/01/16 0445  NA 131* 135 136  K 4.6 4.0 3.7  CL 99* 98* 99*  CO2 23  --  27  GLUCOSE 212* 212* 139*  BUN 27* 28* 23*  CREATININE 1.89* 1.50* 1.37*  CALCIUM 7.9*  --  8.4*    PT/INR:  Recent Labs  08/01/16 0445  LABPROT 14.2  INR 1.09   ABG    Component Value Date/Time   PHART 7.379 07/29/2016 0809   HCO3 23.3 07/29/2016 0809   TCO2 27 07/31/2016 1729   ACIDBASEDEF 2.0 07/29/2016 0809   O2SAT 97.0 07/29/2016  0809   CBG (last 3)   Recent Labs  08/01/16 0550 08/01/16 0708 08/01/16 0813  GLUCAP 103* 93 83    Assessment/Plan: S/P Procedure(s) (LRB): CORONARY ARTERY BYPASS GRAFTING (CABG)x4 using left internal mammary and endoscopic harvest of right greater saphenous vein (N/A) TRANSESOPHAGEAL ECHOCARDIOGRAM (TEE) (N/A) Mobilize Diuresis cont dopamine another 24 hrs   LOS: 8 days    Tharon Aquas Trigt III 08/01/2016

## 2016-08-01 NOTE — Progress Notes (Signed)
   08/01/16 1022  Clinical Encounter Type  Visited With Patient and family together  Visit Type Follow-up  Referral From Chaplain  Consult/Referral To Chaplain  Stress Factors  Patient Stress Factors Health changes  Family Stress Factors Major life changes  Advance Directives (For Healthcare)  Does Patient Have a Medical Advance Directive? No  Would patient like information on creating a medical advance directive? Yes (Inpatient - patient requests chaplain consult to create a medical advance directive)  Pt. Requesting AD information. Follow-up, family present to discuss Ad.  [employer] [phone]

## 2016-08-01 NOTE — Progress Notes (Signed)
ANTICOAGULATION CONSULT NOTE - Follow-Up Consult  Pharmacy Consult:  Heparin Indication:  Recent PE  Allergies  Allergen Reactions  . Lactose Intolerance (Gi) Diarrhea  . Lisinopril Other (See Comments) and Cough    Inflammation, coughing  . Reglan [Metoclopramide] Other (See Comments)    REACTION IS SIDE EFFECT Pt stated having lock jaw as a side effect  . Wellbutrin [Bupropion] Nausea And Vomiting and Cough  . Latex Rash  . Metformin And Related Diarrhea  . Penicillins Rash    [FROM PREVIOUS ENTRY-BEFORE 07/27/16] >>"ALL CILLINS"  Has patient had a PCN reaction causing immediate rash, facial/tongue/throat swelling, SOB or lightheadedness with hypotension: Yes Has patient had a PCN reaction causing severe rash involving mucus membranes or skin necrosis: No Has patient had a PCN reaction that required hospitalization No Has patient had a PCN reaction occurring within the last 10 years: Yes If all of the above answers are "NO", then may proceed with Cephalosporin use.   . Sulfa Antibiotics Itching  . Tape Rash    Patient Measurements: Height: 5\' 4"  (162.6 cm) Weight: 245 lb 9.6 oz (111.4 kg) IBW/kg (Calculated) : 54.7 Heparin Dosing Weight: 81 kg  Vital Signs: Temp: 98.4 F (36.9 C) (11/21 1500) Temp Source: Oral (11/21 1500) BP: 129/73 (11/21 1715) Pulse Rate: 75 (11/21 1800)  Labs:  Recent Labs  07/30/16 0337 07/31/16 0242 07/31/16 1729 08/01/16 0445 08/01/16 1700  HGB 8.0* 7.6* 9.9* 9.4*  --   HCT 24.9* 23.8* 29.0* 28.4*  --   PLT 171 186  --  220  --   LABPROT  --   --   --  14.2  --   INR  --   --   --  1.09  --   HEPARINUNFRC  --   --   --   --  0.24*  CREATININE 1.74* 1.89* 1.50* 1.37*  --     Estimated Creatinine Clearance: 58 mL/min (by C-G formula based on SCr of 1.37 mg/dL (H)).   Medical History: Past Medical History:  Diagnosis Date  . Acid reflux   . Diabetes mellitus without complication ([place])   . High cholesterol   . Hx of chest tube  placement right   . Hypertension   . Neuropathy ([place])   . Pneumonia      Assessment: 61 YOF with history of recent bilateral PEs started on Xarelto on 05/18/16 (last dose 07/22/16).  He underwent CABG on 07/28/16 and was started on Lovenox and Coumadin post-op.  Now transitioned to IV heparin.  Pharmacy consult to manage heparin specifies checking only one heparin level per day and to keep goal between 0.3-0.4 units/mL.  Heparin level 0.24 on 1000 units/hr.  Will increase rate.  Goal of Therapy:  Heparin level 0.3-0.4 units/mL per Dr. [place] Gum Monitor platelets by anticoagulation protocol: Yes   Plan:  - Increase heparin to 1150 units/hr - Next level with AM labs - Daily heparin level and CBC   Tamara Henry, Pharm.D., BCPS Clinical Pharmacist Pager [phone] 08/01/2016 6:21 PM

## 2016-08-01 NOTE — Evaluation (Addendum)
Physical Therapy Evaluation Patient Details Name: Tamara Henry MRN: [place]:[id number] DOB: Oct 31, 1960 Today's Date: 08/01/2016   History of Present Illness  55 yo female admitted with unstable angina s/p CABG x 4 PMHx of DM, HTN, neuropathy, CKD, obesity  Clinical Impression  Pt pleasant and able to tolerate walking 300' with O2 and RW. Pt with decreased mobility, transfers, gait and function who will benefit from acute therapy to maximize mobility, independence and adherence to precautions. Pt educated for all sternal precautions and sequence with mobility. Pt encouraged to ambulate with nursing. Daughter able to assist at home but also cares for 4 small children.   HR 76-81 BP 136/73 before 148/81 after sats 95% on 3L at rest    Follow Up Recommendations Home health PT;Supervision/Assistance - 24 hour    Equipment Recommendations  3in1 (PT)    Recommendations for Other Services OT consult     Precautions / Restrictions Precautions Precautions: Fall;Sternal      Mobility  Bed Mobility Overal bed mobility: Needs Assistance Bed Mobility: Sit to Supine       Sit to supine: Min assist   General bed mobility comments: cues for sequence with assist to bring legs back onto surface  Transfers Overall transfer level: Needs assistance   Transfers: Sit to/from Stand Sit to Stand: Min guard         General transfer comment: cues for hands on thighs and safety  Ambulation/Gait Ambulation/Gait assistance: Min guard Ambulation Distance (Feet): 300 Feet Assistive device: Rolling walker (2 wheeled) Gait Pattern/deviations: Step-through pattern;Decreased stride length;Trunk flexed   Gait velocity interpretation: Below normal speed for age/gender General Gait Details: cues for posture, breathing technique, position in RW and at least 5 standing rest breaks with gait. Pt able to walk 150' on 2L with sats >90% but then sats dropped to 84% and required 4L for remainder of 150 to  return to room  Stairs            Wheelchair Mobility    Modified Rankin (Stroke Patients Only)       Balance Overall balance assessment: Needs assistance   Sitting balance-Leahy Scale: Good       Standing balance-Leahy Scale: Fair                               Pertinent Vitals/Pain Pain Assessment: 0-10 Pain Score: 6  Pain Location: stomach Pain Descriptors / Indicators: Burning    Home Living Family/patient expects to be discharged to:: Private residence Living Arrangements: Children (lives with daughter and her 4 kids ) Available Help at Discharge: Family;Available 24 hours/day Type of Home: Mobile home Home Access: Stairs to enter Entrance Stairs-Rails: Left Entrance Stairs-Number of Steps: 4 Home Layout: One level Home Equipment: Bedside commode;Tub bench;Walker - 2 wheels (has had walker for 2 months)      Prior Function Level of Independence: Independent with assistive device(s);Needs assistance      ADL's / Homemaking Assistance Needed: daughter does cooking, cleaning, household chores and helps her lay out clothes out. Daughter helping her in and out of shower        Hand Dominance        Extremity/Trunk Assessment   Upper Extremity Assessment: Overall WFL for tasks assessed           Lower Extremity Assessment: Overall WFL for tasks assessed      Cervical / Trunk Assessment: Kyphotic  Communication   Communication: No difficulties  Cognition Arousal/Alertness: Awake/alert Behavior During Therapy: WFL for tasks assessed/performed Overall Cognitive Status: Within Functional Limits for tasks assessed                      General Comments      Exercises     Assessment/Plan    PT Assessment Patient needs continued PT services  PT Problem List Decreased activity tolerance;Decreased mobility;Decreased knowledge of use of DME;Decreased knowledge of precautions;Obesity;Cardiopulmonary status limiting  activity          PT Treatment Interventions Gait training;Stair training;Functional mobility training;Therapeutic exercise;DME instruction;Therapeutic activities;Patient/family education    PT Goals (Current goals can be found in the Care Plan section)  Acute Rehab PT Goals Patient Stated Goal: return home PT Goal Formulation: With patient Time For Goal Achievement: 08/15/16 Potential to Achieve Goals: Good    Frequency Min 3X/week   Barriers to discharge        Co-evaluation               End of Session Equipment Utilized During Treatment: Gait belt;Oxygen Activity Tolerance: Patient tolerated treatment well Patient left: in bed;with call bell/phone within reach;with nursing/sitter in room Nurse Communication: Mobility status;Precautions         Time: [id number] PT Time Calculation (min) (ACUTE ONLY): 29 min   Charges:   PT Evaluation $PT Eval Moderate Complexity: 1 Procedure PT Treatments $Therapeutic Activity: 8-22 mins   PT G Codes:        Tamara Henry B Raza Bayless 12-Aug-2016, 10:18 AM  Elwyn Reach, [place]

## 2016-08-01 NOTE — Progress Notes (Signed)
Inpatient Diabetes Program Recommendations  AACE/ADA: New Consensus Statement on Inpatient Glycemic Control (2015)  Target Ranges:  Prepandial:   less than 140 mg/dL      Peak postprandial:   less than 180 mg/dL (1-2 hours)      Critically ill patients:  140 - 180 mg/dL  Results for Tamara Henry, Tamara Henry (MRN [id number]) as of 08/01/2016 13:52  Ref. Range 08/01/2016 00:33 08/01/2016 02:28 08/01/2016 03:17 08/01/2016 04:33 08/01/2016 05:50 08/01/2016 07:08 08/01/2016 08:13 08/01/2016 09:43 08/01/2016 11:12 08/01/2016 12:20  Glucose-Capillary Latest Ref Range: 65 - 99 mg/dL 290 (H) 241 (H) 213 (H) 141 (H) 103 (H) 93 83 190 (H) 133 (H) 119 (H)   Results for Tamara Henry, Tamara Henry (MRN [id number]) as of 08/01/2016 13:52  Ref. Range 05/19/2016 06:58 07/26/2016 04:22  Hemoglobin A1C Latest Ref Range: 4.8 - 5.6 % 9.9 (H) 9.2 (H)   Review of Glycemic Control  Current orders for Inpatient glycemic control: Novolin R insulin drip, Novolin R 0-10 units TID with meals for meal coverage, Levemir 35 units BID  Inpatient Diabetes Program Recommendations: Insulin - IV drip/GlucoStabilizer: Noted that IV insulin was restarted on patient today. Talked with RN and it was reported that patient was restarted on drip due to hyperglycemia and patient is getting ready to be transitioned off IV insulin to SQ insulin. Correction (SSI): When IV insulin is stopped, please consider ordering Novolog 0-20 units TID with meals and Novolog 0-5 units QHS for bedtime correction scale. Insulin - Meal Coverage: If patient is eating and tolerating diet, please consider ordering Novolog 3 units TID with meals for meal coverage if patient eats at least 50% of meals.  Thanks, Barnie Alderman, RN, MSN, CDE Diabetes Coordinator Inpatient Diabetes Program [phone] (Team Pager from 8am to 5pm)

## 2016-08-01 NOTE — Care Management Note (Addendum)
Case Management Note Original Note Created by Michail Jewels 07/24/16  Patient Details  Name: Tamara Henry MRN: [id number] Date of Birth: November 28, 1960  Subjective/Objective:   Pt presented fr unstable angina. Pt is from home with daughter. Pt is currently active with [facility] and has [place] RN, PT, OT, ST. Pt will need resumption orders if the plan continues to be home once stable. Katiana with [facility] is aware that pt is hospitalized.             Action/Plan: CM did speak with pt in regards to DME. Pt states she will benefit from a new RW with [facility]. CM will continue to monitor for additional needs.   Expected Discharge Date:                  Expected Discharge Plan:  [place]  In-House Referral:  NA  Discharge planning Services  CM Consult  Post Acute Care Choice:  Home Health, Resumption of Svcs/PTA Provider Choice offered to:  Patient  DME Arranged:    DME Agency:     HH Arranged: RN, PT, OT ST  HH Agency:  [facility]  Status of Service:  In process, will continue to follow  If discussed at Long Length of Stay Meetings, dates discussed:    Additional Comments: 08/01/2016  CM assessed pt, pt alert and oriented.  Pt states she stays with her daughter and confirmed that she is active with [facility] - pt would like for [facility] to be the agency of choice once orders are written.  Pt states her daughter doesn't work and will be with her 24 hours post discharge as recommended. Previous unit CM has already asked for resumption HH orders and requested DME via physician sticky note  07/31/16 Pt is now 3 days s/p CABG, remains positive in net weight, metalzone will be added to lasix and coumadin regimen started today.  Pt not oriented - bedside nurse informed.  Family friend confirmed that PTA from home with daughter.  CM will continue to follow for discharge needs Maryclare Labrador, RN 08/01/2016, 3:09 PM

## 2016-08-02 ENCOUNTER — Inpatient Hospital Stay (HOSPITAL_COMMUNITY): Payer: Medicare Other

## 2016-08-02 LAB — CBC
HCT: 31.6 % — ABNORMAL LOW (ref 36.0–46.0)
Hemoglobin: 10 g/dL — ABNORMAL LOW (ref 12.0–15.0)
MCH: 27.9 pg (ref 26.0–34.0)
MCHC: 31.6 g/dL (ref 30.0–36.0)
MCV: 88 fL (ref 78.0–100.0)
Platelets: 276 10*3/uL (ref 150–400)
RBC: 3.59 MIL/uL — ABNORMAL LOW (ref 3.87–5.11)
RDW: 15.6 % — ABNORMAL HIGH (ref 11.5–15.5)
WBC: 12.8 10*3/uL — ABNORMAL HIGH (ref 4.0–10.5)

## 2016-08-02 LAB — GLUCOSE, CAPILLARY
GLUCOSE-CAPILLARY: 148 mg/dL — AB (ref 65–99)
GLUCOSE-CAPILLARY: 182 mg/dL — AB (ref 65–99)
Glucose-Capillary: 161 mg/dL — ABNORMAL HIGH (ref 65–99)
Glucose-Capillary: 84 mg/dL (ref 65–99)
Glucose-Capillary: 191 mg/dL — ABNORMAL HIGH (ref 65–99)

## 2016-08-02 LAB — COMPREHENSIVE METABOLIC PANEL
ALT: 29 U/L (ref 14–54)
AST: 35 U/L (ref 15–41)
Albumin: 2.3 g/dL — ABNORMAL LOW (ref 3.5–5.0)
Alkaline Phosphatase: 149 U/L — ABNORMAL HIGH (ref 38–126)
Anion gap: 8 (ref 5–15)
BUN: 19 mg/dL (ref 6–20)
CO2: 29 mmol/L (ref 22–32)
Calcium: 8.2 mg/dL — ABNORMAL LOW (ref 8.9–10.3)
Chloride: 100 mmol/L — ABNORMAL LOW (ref 101–111)
Creatinine, Ser: 1.42 mg/dL — ABNORMAL HIGH (ref 0.44–1.00)
GFR calc Af Amer: 48 mL/min — ABNORMAL LOW (ref >60)
GFR calc non Af Amer: 41 mL/min — ABNORMAL LOW (ref >60)
Glucose, Bld: 174 mg/dL — ABNORMAL HIGH (ref 65–99)
Potassium: 3.8 mmol/L (ref 3.5–5.1)
Sodium: 137 mmol/L (ref 135–145)
Total Bilirubin: 0.4 mg/dL (ref 0.3–1.2)
Total Protein: 6.2 g/dL — ABNORMAL LOW (ref 6.5–8.1)

## 2016-08-02 LAB — HEPARIN LEVEL (UNFRACTIONATED): Heparin Unfractionated: 0.28 IU/mL — ABNORMAL LOW (ref 0.30–0.70)

## 2016-08-02 MED ORDER — MOVING RIGHT ALONG BOOK
Freq: Once | Status: AC
  Administered 2016-08-02: 16:00:00
  Filled 2016-08-02: qty 1

## 2016-08-02 MED ORDER — MAGNESIUM HYDROXIDE 400 MG/5ML PO SUSP: 30.0000 mL | Freq: Every day | ORAL | Status: DC | PRN

## 2016-08-02 MED ORDER — INSULIN ASPART 100 UNIT/ML ~~LOC~~ SOLN
0.0000 [IU] | Freq: Three times a day (TID) | SUBCUTANEOUS | Status: DC
  Administered 2016-08-02: 4 [IU] via SUBCUTANEOUS
  Administered 2016-08-02: 2 [IU] via SUBCUTANEOUS
  Administered 2016-08-02 – 2016-08-03 (×2): 4 [IU] via SUBCUTANEOUS
  Administered 2016-08-03 (×2): 2 [IU] via SUBCUTANEOUS
  Administered 2016-08-03: 8 [IU] via SUBCUTANEOUS
  Administered 2016-08-04 (×2): 4 [IU] via SUBCUTANEOUS
  Administered 2016-08-04 – 2016-08-05 (×2): 2 [IU] via SUBCUTANEOUS
  Administered 2016-08-05: 8 [IU] via SUBCUTANEOUS
  Administered 2016-08-05 – 2016-08-06 (×5): 2 [IU] via SUBCUTANEOUS
  Administered 2016-08-06: 4 [IU] via SUBCUTANEOUS
  Administered 2016-08-07: 2 [IU] via SUBCUTANEOUS
  Administered 2016-08-07: 4 [IU] via SUBCUTANEOUS

## 2016-08-02 MED ORDER — GUAIFENESIN ER 600 MG PO TB12
600.0000 mg | ORAL_TABLET | Freq: Two times a day (BID) | ORAL | Status: DC
  Administered 2016-08-02 – 2016-08-07 (×11): 600 mg via ORAL
  Filled 2016-08-02 (×11): qty 1

## 2016-08-02 MED ORDER — FLUCONAZOLE 100 MG PO TABS
100.0000 mg | ORAL_TABLET | Freq: Every day | ORAL | Status: AC
  Administered 2016-08-02 – 2016-08-04 (×3): 100 mg via ORAL
  Filled 2016-08-02 (×3): qty 1

## 2016-08-02 MED ORDER — HYDROCOD POLST-CPM POLST ER 10-8 MG/5ML PO SUER
5.0000 mL | Freq: Four times a day (QID) | ORAL | Status: DC | PRN
  Administered 2016-08-02 – 2016-08-06 (×5): 5 mL via ORAL
  Filled 2016-08-02 (×5): qty 5

## 2016-08-02 MED ORDER — SODIUM CHLORIDE 0.9% FLUSH
3.0000 mL | Freq: Two times a day (BID) | INTRAVENOUS | Status: DC
  Administered 2016-08-05 – 2016-08-07 (×4): 3 mL via INTRAVENOUS

## 2016-08-02 MED ORDER — SODIUM CHLORIDE 0.9% FLUSH: 3.0000 mL | INTRAVENOUS | Status: DC | PRN

## 2016-08-02 MED ORDER — POTASSIUM CHLORIDE 10 MEQ/50ML IV SOLN
10.0000 meq | INTRAVENOUS | Status: AC
  Administered 2016-08-02 (×2): 10 meq via INTRAVENOUS
  Filled 2016-08-02 (×2): qty 50

## 2016-08-02 MED ORDER — FUROSEMIDE 40 MG PO TABS: 40.0000 mg | ORAL_TABLET | Freq: Every day | ORAL | Status: DC

## 2016-08-02 MED ORDER — POTASSIUM CHLORIDE CRYS ER 20 MEQ PO TBCR
20.0000 meq | EXTENDED_RELEASE_TABLET | Freq: Every day | ORAL | Status: DC
  Administered 2016-08-02: 20 meq via ORAL
  Filled 2016-08-02 (×2): qty 1

## 2016-08-02 MED ORDER — BUDESONIDE 0.5 MG/2ML IN SUSP
0.5000 mg | Freq: Two times a day (BID) | RESPIRATORY_TRACT | Status: DC
  Administered 2016-08-02 – 2016-08-07 (×10): 0.5 mg via RESPIRATORY_TRACT
  Filled 2016-08-02 (×11): qty 2

## 2016-08-02 MED ORDER — SODIUM CHLORIDE 0.9 % IV SOLN: 250.0000 mL | INTRAVENOUS | Status: DC | PRN

## 2016-08-02 MED ORDER — FUROSEMIDE 10 MG/ML IJ SOLN: 40.0000 mg | Freq: Every day | INTRAMUSCULAR | Status: DC

## 2016-08-02 MED ORDER — FUROSEMIDE 10 MG/ML IJ SOLN
40.0000 mg | Freq: Once | INTRAMUSCULAR | Status: AC
  Administered 2016-08-02: 40 mg via INTRAVENOUS
  Filled 2016-08-02: qty 4

## 2016-08-02 MED ORDER — FUROSEMIDE 40 MG PO TABS
40.0000 mg | ORAL_TABLET | Freq: Every day | ORAL | Status: DC
  Administered 2016-08-04 – 2016-08-05 (×2): 40 mg via ORAL
  Filled 2016-08-02 (×2): qty 1

## 2016-08-02 NOTE — Care Management Important Message (Signed)
Important Message  Patient Details  Name: Tamara Henry MRN: [id number] Date of Birth: June 24, 1961   Medicare Important Message Given:  Yes    Nathen May 08/02/2016, 12:34 PM

## 2016-08-02 NOTE — Progress Notes (Signed)
Physical Therapy Treatment Patient Details Name: Tamara Henry MRN: :[id number] DOB: 07/06/1961 Today's Date: 08/02/2016    History of Present Illness 55 yo female admitted with unstable angina s/p CABG x 4 PMHx of DM, HTN, neuropathy, CKD, obesity    PT Comments    Patient progressing towards all goals. Patient ambulated on Ra. Noted SPO2 drop into 70's during ambulation without Rw, requiring rest breaks standing, however asymptomatic. Patient overall felt less fatigued today compared to yesterday. At the end of the visit, patient toileted in the bathroom without assistance during. Will continue to follow.    Follow Up Recommendations  Home health PT;Supervision/Assistance - 24 hour     Equipment Recommendations       Recommendations for Other Services       Precautions / Restrictions Precautions Precautions: Sternal;Fall Precaution Comments: Patient was able to recall and implement precautions.     Mobility  Bed Mobility               General bed mobility comments: patient received up in chair   Transfers Overall transfer level: Needs assistance Equipment used: Rolling walker (2 wheeled) Transfers: Sit to/from Stand Sit to Stand: Min guard         General transfer comment: patient was cued once to place hands on thighs when standing   Ambulation/Gait Ambulation/Gait assistance: Min guard Ambulation Distance (Feet): 300 Feet Assistive device: Rolling walker (2 wheeled) (patient did not use walker for first half of walk ) Gait Pattern/deviations: Step-through pattern   Gait velocity interpretation: Below normal speed for age/gender General Gait Details: cues required for breathing when O2sat dropped <90%. Patient walked on room air. Patient typically stayed >88% O2sat, but sats dropped to mid 70's towards the end. Patient did not present as such, and O2sat quickly returned to normal after deep breaths.    Stairs            Wheelchair Mobility     Modified Rankin (Stroke Patients Only)       Balance Overall balance assessment: Needs assistance   Sitting balance-Leahy Scale: Good       Standing balance-Leahy Scale: Fair                      Cognition Arousal/Alertness: Awake/alert Behavior During Therapy: WFL for tasks assessed/performed Overall Cognitive Status: Within Functional Limits for tasks assessed                      Exercises      General Comments        Pertinent Vitals/Pain Pain Assessment: No/denies pain    Home Living                      Prior Function            PT Goals (current goals can now be found in the care plan section) Acute Rehab PT Goals Patient Stated Goal: return home PT Goal Formulation: With patient Time For Goal Achievement: 08/15/16 Potential to Achieve Goals: Good Progress towards PT goals: Progressing toward goals    Frequency    Min 3X/week      PT Plan Current plan remains appropriate    Co-evaluation             End of Session Equipment Utilized During Treatment: Gait belt Activity Tolerance: Patient tolerated treatment well;No increased pain Patient left: in chair;with call bell/phone within reach     Time: [phone]  PT Time Calculation (min) (ACUTE ONLY): 34 min  Charges:  $Gait Training: 8-22 mins $Therapeutic Activity: 8-22 mins                    G Codes:      Daiki Dicostanzo 08-10-16, 9:20 AM  Ezekiah Massie D. Harryette Shuart SPT [phone]

## 2016-08-02 NOTE — Discharge Instructions (Signed)

## 2016-08-02 NOTE — Discharge Summary (Signed)
Physician Discharge Summary  Patient ID: Tamara Henry MRN: [id number] DOB/AGE: 1961/03/04 55 y.o.  Admit date: 07/23/2016 Discharge date: 08/07/2016  Admission Diagnoses: Patient Active Problem List   Diagnosis Date Noted  . 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 Coronary artery disease involving native heart without angina pectoris   . Chest pain 07/23/2016  . Unstable angina ([place]) 07/23/2016  . Syncope 05/18/2016  . Bilateral pulmonary embolism ([employer]) 05/17/2016  . Hyperlipidemia 05/17/2016  . Depression 05/17/2016  . Diabetic peripheral neuropathy ([place]) 05/17/2016  . Type 2 diabetes mellitus ([place]) 05/17/2016  . Neuritis of upper extremity, C7 01/15/2015  . Urinary retention 01/14/2015  . Acute renal failure ([place]) 01/14/2015  . DM type 2, uncontrolled, with renal complications ([place]) 74/25/9563  . Hypertension 01/14/2015  . Bowel incontinence 01/14/2015  . Numbness and tingling of right arm 01/14/2015    Discharge Diagnoses:  Principal Problem:   Unstable angina ([place]) Active Problems:   DM type 2, uncontrolled, with renal complications ([place])   Hypertension   Bilateral pulmonary embolism (H[facility])   Hyperlipidemia   Diabetic peripheral neuropathy ([facility])   Chest pain   Coronary artery disease involving native heart without angina pectoris   S/P CABG x 4   Discharged Condition: good  HPI:  55 year old morbidly obese diabetic AA female admitted to the hospital this past weekend with unstable angina. Cardiac enzymes were  minimally elevated. The patient had poorly controlled diabetes and hypertension and was known to have significant coronary calcification on recent CTA of thorax when she was hospitalized and treated for severe acute pulmonary emboli September 2017. The patient recently had oral and dental surgery and felt chest pain shortly after the procedure. The patient has been on  Xarelto since she was diagnosed with bilateral pulmonary emboli September 2017. She did not complain of  shortness of breath hemoptysis or low oxygen saturation which presented with chest pain. A repeat CTA was not performed.  The patient has been on IV heparin since current admission and last dose of Xarelto was November 11.   Echocardiogram at the time of pulmonary embolus diagnosis showed normal biventricular function no valvular abnormalities, no right heart strain. Repeat echo is pending showing her presentation with non-STEMI.  Cardiac catheterization today by Dr. Martinique demonstrates severe multivessel CAD and a diabetic pattern with LVEDP 27 mmHg and EF 55%. Coronary anatomy is not consistent with PCI and cardiothoracic surgical evaluation for CABG has been recommended.. The catheterization was done via right radial artery. No unexpected bleeding from the arterial puncture. The patient is right-hand dominant.  The patient denies any history of DVT or venous disease of the lower extremities. Repeat duplex scan of her lower extremity veins is pending.  Hospital Course:  Mrs. Walls underwent a CABG x 4 on 07/28/2016. She tolerated the procedure well and was transferred to the ICU. She was extubated POD 1. She remained hemodynamically stable on milrinone and Neo. Her chest tubes and lines were removed postop day 1. Amiodarone was initiated for rhythm control. We initiated diuretic regimen for fluid overload. We monitored her postoperative expected anemia. Overall she is doing very well postop day 2. She remains on 6 L/m nasal cannula for oxygen support. Her creatinine continued to trend up and she was making very little urine. This is likely due to ATN. We continued her diuretics. We converted her amiodarone to oral pills. On postop day 3 we initiated a Coumadin regimen for anticoagulation. Heparin was also initiated. We added metolazone for increased diuretic  effect. We continued renal dose dopamine through her PICC line. She continued to require ICU care. She required one packed red blood cell 4  acute blood loss anemia and responded appropriately. We began weaning her dopamine. We continued to increase her insulin coverage and add meal coverage since her blood glucose level was higher than desired. Her pre op HGA1C  was 9.2. She will need close medical follow up after discharge. We continued her heparin for recent bilateral pulmonary embolism.  She was transferred from the ICU to 2000 for further convalescence on 08/02/2016. The Heparin drip was stopped on 11/25 so that the epicardial pacing wires could be removed. Per Dr. Prescott Gum, she will then be started on Xarelto on Monday 11/27.  Her creatinine was slowly rising with most recent being 1.77.  She has baseline renal insufficiency, but will require close follow up at discharge. She did develop minor serosanguinous drainage from her lower sternal wound. There was no erythema, she had no fever. This was likely due to fat necrosis and she does not need an antibiotic at this time. She is tolerating a diet and has had a bowel movement. She is ambulating on room air. Her chest tube sutures will be removed the day of discharge. She is felt surgically stable for discharge today.  Consults: diabetic coordinator  Significant Diagnostic Studies: EXAM: CHEST  2 VIEW  COMPARISON:  08/02/2016  FINDINGS: Right PICC line tip upper to mid SVC level. Coronal bypass changes noted. Stable cardiomegaly with improving bibasilar atelectasis. Trace bilateral pleural effusions noted. No superimposed edema. Negative for pneumothorax. Trachea is midline. No acute osseous finding.  IMPRESSION: Improving bibasilar atelectasis.  Residual small pleural effusions  No pneumothorax   Electronically Signed   By: Jerilynn Mages.  Shick M.D.   On: 08/03/2016 10:35   Treatments:  NAME:  BLANCA, CARREON                ACCOUNT NO.:  [id number]  MEDICAL RECORD NO.:  [id number]  LOCATION:  2S10C                        FACILITY:  [facility]  PHYSICIAN:  Ivin Poot,  M.D.  DATE OF BIRTH:  Oct 08, 1960  DATE OF PROCEDURE: DATE OF DISCHARGE:                              OPERATIVE REPORT   OPERATION: 1. Coronary artery bypass grafting x4 (left internal mammary artery to     left anterior descending, saphenous vein graft to diagonal,     saphenous vein graft to obtuse marginal 1, saphenous vein graft to     distal circumflex). 2. Endoscopic harvest of right leg greater saphenous vein.  SURGEON:  Ivin Poot, MD.  ASSISTANT:  Nicholes Rough, PA-C.  PREOPERATIVE DIAGNOSES:  Severe 3-vessel coronary artery disease, unstable angina, and non-ST-elevation myocardial infarction.  POSTOPERATIVE DIAGNOSES:  Severe 3-vessel coronary artery disease, unstable angina, and non-ST-elevation myocardial infarction.  Discharge Exam: Blood pressure 134/68, pulse 65, temperature 98.2 F (36.8 C), temperature source Oral, resp. rate 18, height 5' 4"  (1.626 m), weight 232 lb 9.6 oz (105.5 kg), last menstrual period 07/22/2016, SpO2 95 %.   General appearance: alert, cooperative and no distress Resp: clear to auscultation bilaterally Cardio: regular rate and rhythm GI: soft, non-tender; bowel sounds normal; no masses,  no organomegaly Extremities: extremities normal, atraumatic, no cyanosis or edema Incision/Wound: clean  and dry  Disposition: 01-Home or Self Care     Medication List    STOP taking these medications   amLODipine 5 MG tablet Commonly known as:  NORVASC   hydrochlorothiazide 12.5 MG capsule Commonly known as:  MICROZIDE   HYDROcodone-acetaminophen 5-325 MG tablet Commonly known as:  NORCO/VICODIN   ibuprofen 200 MG tablet Commonly known as:  ADVIL,MOTRIN     TAKE these medications   ALIGN 4 MG Caps Take 1 capsule (4 mg total) by mouth daily.   amiodarone 200 MG tablet Commonly known as:  PACERONE Take 1 tablet (200 mg total) by mouth 2 (two) times daily after a meal. For 7 days, then decrease to once a day   aspirin 325  MG EC tablet Take 1 tablet (325 mg total) by mouth daily. Start taking on:  08/08/2016   blood glucose meter kit and supplies Dispense based on patient and insurance preference. Use four times with each meal and at bedtime. (FOR ICD-9 250.00, 250.01).   clotrimazole 1 % cream Commonly known as:  LOTRIMIN Apply to affected area 2 times daily   ezetimibe-simvastatin 10-10 MG tablet Commonly known as:  VYTORIN Take 1 tablet by mouth daily.   ferrous YDXAJOIN-O67-EHMCNOB C-folic acid capsule Commonly known as:  TRINSICON / FOLTRIN Take 1 capsule by mouth 3 (three) times daily after meals.   fluconazole 200 MG tablet Commonly known as:  DIFLUCAN Take one 225m tablet on day one and take second 2074mtablet on day 4.   furosemide 40 MG tablet Commonly known as:  LASIX Take 1 tablet (40 mg total) by mouth daily.   gabapentin 400 MG capsule Commonly known as:  NEURONTIN Take 400 mg by mouth 3 (three) times daily.   insulin aspart 100 UNIT/ML injection Commonly known as:  novoLOG Inject 5 Units into the skin 2 (two) times daily before a meal.   insulin glargine 100 UNIT/ML injection Commonly known as:  LANTUS Inject 0.15 mLs (15 Units total) into the skin 2 (two) times daily. What changed:  when to take this   MELATONIN PO Take 1 tablet by mouth at bedtime.   methocarbamol 750 MG tablet Commonly known as:  ROBAXIN Take 1,500 mg by mouth 2 (two) times daily as needed for muscle spasms.   metoprolol tartrate 25 MG tablet Commonly known as:  LOPRESSOR Take 25 mg by mouth 2 (two) times daily.   omeprazole 40 MG capsule Commonly known as:  PRILOSEC Take 40 mg by mouth daily.   oxyCODONE 5 MG immediate release tablet Commonly known as:  Oxy IR/ROXICODONE Take 1-2 tablets (5-10 mg total) by mouth every 3 (three) hours as needed for severe pain. What changed:  medication strength  how much to take  when to take this  reasons to take this   PARoxetine 40 MG  tablet Commonly known as:  PAXIL Take 40 mg by mouth daily with breakfast.   Potassium Chloride ER 20 MEQ Tbcr Take 10 mEq by mouth daily.   rivaroxaban 20 MG Tabs tablet Commonly known as:  XARELTO Take 20 mg by mouth every morning.   Rivaroxaban 15 & 20 MG Tbpk Take as directed on package: Start with one 1534mablet by mouth twice a day with food. On Day 22, switch to one 39m17mblet once a day with food.      The patient has been discharged on:   1.Beta Blocker:  Yes [ x  ]  No   [   ]                              If No, reason:  2.Ace Inhibitor/ARB: Yes [   ]                                     No  [   x ]                                     If No, reason: Elevated creatinine  3.Statin:   Yes [ x  ]                  No  [   ]                  If No, reason:  4.Ecasa:  Yes  [ x  ]                  No   [   ]                  If No, reason:  Follow-up Information    Fransico Him, MD Follow up.   Specialty:  Cardiology Contact information: [address] [address] [phone]        Philis Fendt, MD Follow up.   Specialty:  Internal Medicine Why:  Call for a follow up appointment regarding further surveillance of HGA1C 9.2 and diabetes management Contact information: [place] [address] [phone]        Tharon Aquas Trigt III, MD Follow up on 09/13/2016.   Specialty:  Cardiothoracic Surgery Why:  PA/LAT CXR to be taken (at [facility] which is in the same building as Dr. Lucianne Lei Trigt's office) on 09/14/2015 at 11:00 am;Appointment time is at 11:30 am Contact information: [place] [address] [phone]           Signed: Caryn Bee 08/07/2016, 11:51 AM

## 2016-08-02 NOTE — Progress Notes (Signed)
5 Days Post-Op Procedure(s) (LRB): CORONARY ARTERY BYPASS GRAFTING (CABG)x4 using left internal mammary and endoscopic harvest of right greater saphenous vein (N/A) TRANSESOPHAGEAL ECHOCARDIOGRAM (TEE) (N/A) Subjective: Urgent CABG for NSTEMI Recent bilateral PE on preop Xarelto Low postop O2 sats On iv heparin until DC EPWs at DC Objective: Vital signs in last 24 hours: Temp:  [98 F (36.7 C)-98.4 F (36.9 C)] 98.1 F (36.7 C) (11/22 0900) Pulse Rate:  [68-78] 70 (11/22 0850) Cardiac Rhythm: Normal sinus rhythm (11/22 0400) Resp:  [15-23] 19 (11/22 0600) BP: (102-133)/(59-78) 110/59 (11/22 0850) SpO2:  [90 %-98 %] 97 % (11/22 0850) Weight:  [247 lb 2.2 oz (112.1 kg)] 247 lb 2.2 oz (112.1 kg) (11/22 0407)  Hemodynamic parameters for last 24 hours:  stable  Intake/Output from previous day: 11/21 0701 - 11/22 0700 In: 723.5 [P.O.:480; I.V.:143.5; IV Piggyback:100] Out: 3350 [Urine:3350] Intake/Output this shift: No intake/output data recorded.       Exam    General- alert and comfortable   Lungs- clear without rales, wheezes   Cor- regular rate and rhythm, no murmur , gallop   Abdomen- soft, non-tender   Extremities - warm, non-tender, minimal edema   Neuro- oriented, appropriate, no focal weakness     Lab Results:  Recent Labs  08/01/16 0445 08/02/16 0633  WBC 12.7* 12.8*  HGB 9.4* 10.0*  HCT 28.4* 31.6*  PLT 220 276   BMET:  Recent Labs  08/01/16 0445 08/02/16 0633  NA 136 137  K 3.7 3.8  CL 99* 100*  CO2 27 29  GLUCOSE 139* 174*  BUN 23* 19  CREATININE 1.37* 1.42*  CALCIUM 8.4* 8.2*    PT/INR:  Recent Labs  08/01/16 0445  LABPROT 14.2  INR 1.09   ABG    Component Value Date/Time   PHART 7.379 07/29/2016 0809   HCO3 23.3 07/29/2016 0809   TCO2 27 07/31/2016 1729   ACIDBASEDEF 2.0 07/29/2016 0809   O2SAT 97.0 07/29/2016 0809   CBG (last 3)   Recent Labs  08/01/16 2338 08/02/16 0432 08/02/16 0858  GLUCAP 76 161* 84     Assessment/Plan: S/P Procedure(s) (LRB): CORONARY ARTERY BYPASS GRAFTING (CABG)x4 using left internal mammary and endoscopic harvest of right greater saphenous vein (N/A) TRANSESOPHAGEAL ECHOCARDIOGRAM (TEE) (N/A) tx to PCTU Wean off renal dopamine Heparin for preop PE- target level  < 0.3, current dose 1200 u /hr   LOS: 9 days    Tamara Henry 08/02/2016

## 2016-08-02 NOTE — Progress Notes (Signed)
ANTICOAGULATION CONSULT NOTE - Follow Up Consult  Pharmacy Consult for heparin Indication: pulmonary embolus   Labs:  Recent Labs  07/31/16 0242 07/31/16 1729 08/01/16 0445 08/01/16 1700 08/02/16 0633 08/02/16 0634  HGB 7.6* 9.9* 9.4*  --  10.0*  --   HCT 23.8* 29.0* 28.4*  --  31.6*  --   PLT 186  --  220  --  276  --   LABPROT  --   --  14.2  --   --   --   INR  --   --  1.09  --   --   --   HEPARINUNFRC  --   --   --  0.24*  --  0.28*  CREATININE 1.89* 1.50* 1.37*  --  1.42*  --      Assessment: 55yo female subtherapeutic on heparin after rate increase, close to goal.  Goal of Therapy:  Heparin level 0.3-0.4 units/ml   Plan:  Will increase heparin gtt slightly to 1250 units/hr and check level with next am labs (no more than one heparin level daily per MD request).  Wynona Neat, PharmD, BCPS  08/02/2016,7:16 AM

## 2016-08-03 ENCOUNTER — Inpatient Hospital Stay (HOSPITAL_COMMUNITY): Payer: Medicare Other

## 2016-08-03 LAB — BASIC METABOLIC PANEL
Anion gap: 10 (ref 5–15)
BUN: 20 mg/dL (ref 6–20)
CO2: 26 mmol/L (ref 22–32)
Calcium: 8.5 mg/dL — ABNORMAL LOW (ref 8.9–10.3)
Chloride: 96 mmol/L — ABNORMAL LOW (ref 101–111)
Creatinine, Ser: 1.45 mg/dL — ABNORMAL HIGH (ref 0.44–1.00)
GFR calc Af Amer: 47 mL/min — ABNORMAL LOW (ref >60)
GFR calc non Af Amer: 40 mL/min — ABNORMAL LOW (ref >60)
Glucose, Bld: 247 mg/dL — ABNORMAL HIGH (ref 65–99)
Potassium: 4.5 mmol/L (ref 3.5–5.1)
Sodium: 132 mmol/L — ABNORMAL LOW (ref 135–145)

## 2016-08-03 LAB — CBC
HCT: 32.3 % — ABNORMAL LOW (ref 36.0–46.0)
Hemoglobin: 10.2 g/dL — ABNORMAL LOW (ref 12.0–15.0)
MCH: 27.9 pg (ref 26.0–34.0)
MCHC: 31.6 g/dL (ref 30.0–36.0)
MCV: 88.3 fL (ref 78.0–100.0)
Platelets: 279 10*3/uL (ref 150–400)
RBC: 3.66 MIL/uL — ABNORMAL LOW (ref 3.87–5.11)
RDW: 15.3 % (ref 11.5–15.5)
WBC: 12.1 10*3/uL — ABNORMAL HIGH (ref 4.0–10.5)

## 2016-08-03 LAB — GLUCOSE, CAPILLARY
GLUCOSE-CAPILLARY: 139 mg/dL — AB (ref 65–99)
GLUCOSE-CAPILLARY: 182 mg/dL — AB (ref 65–99)
Glucose-Capillary: 227 mg/dL — ABNORMAL HIGH (ref 65–99)
Glucose-Capillary: 150 mg/dL — ABNORMAL HIGH (ref 65–99)

## 2016-08-03 LAB — HEPARIN LEVEL (UNFRACTIONATED): Heparin Unfractionated: 0.42 IU/mL (ref 0.30–0.70)

## 2016-08-03 MED ORDER — POTASSIUM CHLORIDE CRYS ER 20 MEQ PO TBCR
20.0000 meq | EXTENDED_RELEASE_TABLET | Freq: Every day | ORAL | Status: DC
  Administered 2016-08-04 – 2016-08-05 (×2): 20 meq via ORAL
  Filled 2016-08-03 (×2): qty 1

## 2016-08-03 MED ORDER — INSULIN DETEMIR 100 UNIT/ML ~~LOC~~ SOLN
38.0000 [IU] | Freq: Two times a day (BID) | SUBCUTANEOUS | Status: DC
  Administered 2016-08-03 – 2016-08-07 (×8): 38 [IU] via SUBCUTANEOUS
  Filled 2016-08-03 (×10): qty 0.38

## 2016-08-03 NOTE — Progress Notes (Signed)
ANTICOAGULATION CONSULT NOTE - Follow Up Consult  Pharmacy Consult for heparin Indication: pulmonary embolus   Labs:  Recent Labs  07/31/16 1729 08/01/16 0445 08/01/16 1700 08/02/16 0633 08/02/16 0634 08/03/16 0445  HGB 9.9* 9.4*  --  10.0*  --  10.2*  HCT 29.0* 28.4*  --  31.6*  --  32.3*  PLT  --  220  --  276  --  279  LABPROT  --  14.2  --   --   --   --   INR  --  1.09  --   --   --   --   HEPARINUNFRC  --   --  0.24*  --  0.28* 0.42  CREATININE 1.50* 1.37*  --  1.42*  --   --      Assessment: 55yo female now just above goal on heparin.  Goal of Therapy:  Heparin level 0.3-0.4 units/ml   Plan:  Will decrease heparin gtt slightly to 1200 units/hr and check level with next am labs (no more than one heparin level daily per MD request).  Wynona Neat, PharmD, BCPS  08/03/2016,5:27 AM

## 2016-08-03 NOTE — Progress Notes (Addendum)
      [address]       [place] [address]             [phone]        6 Days Post-Op Procedure(s) (LRB): CORONARY ARTERY BYPASS GRAFTING (CABG)x4 using left internal mammary and endoscopic harvest of right greater saphenous vein (N/A) TRANSESOPHAGEAL ECHOCARDIOGRAM (TEE) (N/A)  Subjective: Patient with loose stools.  Objective: Vital signs in last 24 hours: Temp:  [97.3 F (36.3 C)-98.6 F (37 C)] 98.1 F (36.7 C) (11/23 0500) Pulse Rate:  [70-79] 73 (11/23 0500) Cardiac Rhythm: Normal sinus rhythm (11/23 0719) Resp:  [10-27] 18 (11/23 0500) BP: (88-140)/(59-79) 127/69 (11/23 0500) SpO2:  [93 %-100 %] 93 % (11/23 0500) Weight:  [236 lb 6.4 oz (107.2 kg)] 236 lb 6.4 oz (107.2 kg) (11/23 0500)  Pre op weight 107 kg Current Weight  08/03/16 236 lb 6.4 oz (107.2 kg)      Intake/Output from previous day: 11/22 0701 - 11/23 0700 In: 1979.4 [P.O.:1560; I.V.:419.4] Out: 4050 [Urine:4050]   Physical Exam:  Cardiovascular: RRR Pulmonary: Slightly diminished at bases L>R Abdomen: Soft, non tender, bowel sounds present. Extremities: Mild bilateral lower extremity edema. Wounds: Clean and dry.  No erythema or signs of infection. Dressing has sero sanguinous ooze (from chest tube sites).  Lab Results: CBC: Recent Labs  08/02/16 0633 08/03/16 0445  WBC 12.8* 12.1*  HGB 10.0* 10.2*  HCT 31.6* 32.3*  PLT 276 279   BMET:  Recent Labs  08/02/16 0633 08/03/16 0445  NA 137 132*  K 3.8 4.5  CL 100* 96*  CO2 29 26  GLUCOSE 174* 247*  BUN 19 20  CREATININE 1.42* 1.45*  CALCIUM 8.2* 8.5*    PT/INR:  Lab Results  Component Value Date   INR 1.09 08/01/2016   INR 1.25 07/28/2016   INR 1.00 07/26/2016   ABG:  INR: Will add last result for INR, ABG once components are confirmed Will add last 4 CBG results once components are confirmed  Assessment/Plan:  1. CV - Previous a fib. Maintaining SR. S/p NSTEMI. On Amiodarone 400 mg bid and  Lopressor 25 mg bid. 2.  Pulmonary - On room air. CXR this am appears to show no pneumothorax, bibasilar atelectasis and pleural effusions. Encourage incentive spirometer. 3. Volume Overload -On Lasix 40 mg daily. 4.  Acute blood loss anemia - H and H stable at 10.2 and 32.3. On Trinsicon. 5. Bilateral PE pre op-on Heparin drip 6. Hyponatremia-sodium decreased to 132. Likely related to diuresis 7. Previously on renal dose dopamine drip. Creatinine this am 1.45. Creatinine upon admission was 1.66. 8. DM-CBGs 191/182/227. On Insulin but will increase for better glucose control. Pre op HGA1C 9.2. She will need close follow up with medical doctor after discharge. 9. GI-diarrhea. Stop stool softeners and already on Imodium If continues, will check C Dif 10. Will remove EPW on Sunday  ZIMMERMAN,DONIELLE MPA-C 08/03/2016,7:36 AM   Chart reviewed, patient examined, agree with above. She is making progress. Will plan to stop heparin Sunday am, remove pacing wires later that day and probably send home Monday on Xarelto.

## 2016-08-04 LAB — BASIC METABOLIC PANEL
ANION GAP: 8 (ref 5–15)
BUN: 19 mg/dL (ref 6–20)
CHLORIDE: 100 mmol/L — AB (ref 101–111)
CO2: 29 mmol/L (ref 22–32)
Calcium: 8.8 mg/dL — ABNORMAL LOW (ref 8.9–10.3)
Creatinine, Ser: 1.45 mg/dL — ABNORMAL HIGH (ref 0.44–1.00)
GFR, EST AFRICAN AMERICAN: 47 mL/min — AB (ref >60)
GFR, EST NON AFRICAN AMERICAN: 40 mL/min — AB (ref >60)
Glucose, Bld: 93 mg/dL (ref 65–99)
POTASSIUM: 4.2 mmol/L (ref 3.5–5.1)
SODIUM: 137 mmol/L (ref 135–145)

## 2016-08-04 LAB — GLUCOSE, CAPILLARY
GLUCOSE-CAPILLARY: 165 mg/dL — AB (ref 65–99)
Glucose-Capillary: 83 mg/dL (ref 65–99)
Glucose-Capillary: 152 mg/dL — ABNORMAL HIGH (ref 65–99)
Glucose-Capillary: 197 mg/dL — ABNORMAL HIGH (ref 65–99)

## 2016-08-04 LAB — HEPARIN LEVEL (UNFRACTIONATED): Heparin Unfractionated: 0.31 IU/mL (ref 0.30–0.70)

## 2016-08-04 NOTE — Progress Notes (Signed)
Physical Therapy Treatment Patient Details Name: Tamara Henry MRN: [id number] DOB: 1961/07/24 Today's Date: 08/04/2016    History of Present Illness 55 yo female admitted with unstable angina s/p CABG x 4 PMHx of DM, HTN, neuropathy, CKD, obesity    PT Comments    Patient was pleasant and willing to ambulate and try stairs today. She was able to walk approximately 150 feet on room air before O2sat dropped to 87%, at which point patient was put on 2L O2 for remainder of walk. She also had upright posture and non-staggering gait while ambulating without a walker, which she did not think she could do well prior to attempting. The patient's family was present at the end of the visit and stated their interest in her receiving care other than home health due to the patient's daughter tending to her children. Family was educated on the patient's current plan to receive home health. Will continue to follow.   Resting O2sat room air: 92% O2sat midwalk: 87%  Resting O2sat postwalk room air: 93%  HR resting: 70  HR midwalk: 82 HR postwalk: 75   BP pre: 129/80 BP post: 160/79   Follow Up Recommendations  Supervision/Assistance - 24 hour;Home health PT     Equipment Recommendations  3in1 (PT)    Recommendations for Other Services OT consult     Precautions / Restrictions Precautions Precautions: Sternal;Fall Precaution Comments: Patient was able to repeat 3/5 for sternal precautions. She was able to implement precautions after reviewing them with PT.     Mobility  Bed Mobility Overal bed mobility: Needs Assistance Bed Mobility: Supine to Sit     Supine to sit: Min assist (Device/Increase time)     General bed mobility comments: patient required cuing to scoot to EOB, and how to roll without pulling with arms.   Transfers Overall transfer level: Needs assistance Equipment used: Rolling walker (2 wheeled) Transfers: Sit to/from Stand Sit to Stand: Supervision             Ambulation/Gait   Ambulation Distance (Feet): 500 Feet Assistive device: Rolling walker (2 wheeled) (Patient began with RW use and from midwalk to end did not use an AD. ) Gait Pattern/deviations: Step-through pattern     General Gait Details: Patient's O2sat dropped below 88% during ambulation on two occasions, and required cues to take deep breaths which returned O2sats to above 90%. Patient maintained an upright posture during ambulation.    Stairs Stairs: Yes Stairs assistance: Supervision Stair Management: Two rails;Step to pattern Number of Stairs: 4 General stair comments: Patient did not require cuing during stairs, and was able to ascend and descend stairs with  increased time.  Wheelchair Mobility    Modified Rankin (Stroke Patients Only)       Balance           Standing balance support: No upper extremity supported Standing balance-Leahy Scale: Good                      Cognition Arousal/Alertness: Awake/alert Behavior During Therapy: WFL for tasks assessed/performed Overall Cognitive Status: Within Functional Limits for tasks assessed                      Exercises General Exercises - Lower Extremity Long Arc Quad: AROM;Both;10 reps;Seated Hip Flexion/Marching: AROM;Both;10 reps;Seated    General Comments        Pertinent Vitals/Pain Pain Assessment: No/denies pain    Home Living  Prior Function            PT Goals (current goals can now be found in the care plan section) Acute Rehab PT Goals Patient Stated Goal: return home PT Goal Formulation: With patient Time For Goal Achievement: 08/15/16 Potential to Achieve Goals: Good Progress towards PT goals: Progressing toward goals    Frequency    Min 3X/week      PT Plan Current plan remains appropriate    Co-evaluation             End of Session Equipment Utilized During Treatment: Gait belt Activity Tolerance: Patient  tolerated treatment well;No increased pain Patient left: in chair;with call bell/phone within reach     Time: 1339-1406 PT Time Calculation (min) (ACUTE ONLY): 27 min  Charges:  $Gait Training: 23-37 mins                    G Codes:      Davyon Fisch 2016-08-09, 2:47 PM  [place] SPT [id number]

## 2016-08-04 NOTE — Progress Notes (Signed)
ANTICOAGULATION CONSULT NOTE - Follow Up Consult  Pharmacy Consult for heparin Indication: pulmonary embolus   Labs:  Recent Labs  08/02/16 0633 08/02/16 0634 08/03/16 0445 08/04/16 0433  HGB 10.0*  --  10.2*  --   HCT 31.6*  --  32.3*  --   PLT 276  --  279  --   HEPARINUNFRC  --  0.28* 0.42 0.31  CREATININE 1.42*  --  1.45* 1.45*     Assessment: 55yo female with bilateral PE oh heparin and at goal on 1200 units/hr. For removal of pacing wires on Sunday (heparin to be held in am on 11/26)  Goal of Therapy:  Heparin level 0.3-0.4 units/ml Platelet monitoring per protocol: yes   Plan:  -No heparin changes needed -Daily heparin level and CBC  Hildred Laser, Pharm D 08/04/2016 8:54 AM

## 2016-08-04 NOTE — Progress Notes (Addendum)
      [address]       [place] [address]             [phone]        7 Days Post-Op Procedure(s) (LRB): CORONARY ARTERY BYPASS GRAFTING (CABG)x4 using left internal mammary and endoscopic harvest of right greater saphenous vein (N/A) TRANSESOPHAGEAL ECHOCARDIOGRAM (TEE) (N/A)  Subjective: Patient's diarrhea has stopped. She still has drainage from lower sternal wound.  Objective: Vital signs in last 24 hours: Temp:  [97.5 F (36.4 C)-97.9 F (36.6 C)] 97.5 F (36.4 C) (11/24 0500) Pulse Rate:  [67-79] 67 (11/24 0500) Cardiac Rhythm: Normal sinus rhythm (11/23 2200) Resp:  [16-18] 18 (11/24 0500) BP: (121-145)/(70-82) 135/81 (11/24 0500) SpO2:  [94 %-95 %] 94 % (11/24 0500) Weight:  [234 lb 9.6 oz (106.4 kg)] 234 lb 9.6 oz (106.4 kg) (11/24 0500)  Pre op weight 107 kg Current Weight  08/04/16 234 lb 9.6 oz (106.4 kg)      Intake/Output from previous day: 11/23 0701 - 11/24 0700 In: 765.2 [P.O.:480; I.V.:285.2] Out: 850 [Urine:850]   Physical Exam:  Cardiovascular: RRR Pulmonary: Slightly diminished at bases L>R Abdomen: Soft, non tender, bowel sounds present. Extremities: Trace bilateral lower extremity edema. Wounds: Clean and dry.  No erythema or signs of infection. Dressing has sero sanguinous ooze (from lower sternal wound). There is no erythema.  Lab Results: CBC:  Recent Labs  08/02/16 0633 08/03/16 0445  WBC 12.8* 12.1*  HGB 10.0* 10.2*  HCT 31.6* 32.3*  PLT 276 279   BMET:   Recent Labs  08/03/16 0445 08/04/16 0433  NA 132* 137  K 4.5 4.2  CL 96* 100*  CO2 26 29  GLUCOSE 247* 93  BUN 20 19  CREATININE 1.45* 1.45*  CALCIUM 8.5* 8.8*    PT/INR:  Lab Results  Component Value Date   INR 1.09 08/01/2016   INR 1.25 07/28/2016   INR 1.00 07/26/2016   ABG:  INR: Will add last result for INR, ABG once components are confirmed Will add last 4 CBG results once components are confirmed  Assessment/Plan:  1.  CV - Previous a fib. Maintaining SR. S/p NSTEMI. On Amiodarone 400 mg bid and Lopressor 25 mg bid. Not on ACE secondary to elevated creatinine. 2.  Pulmonary - On room air. Encourage incentive spirometer. 3. Volume Overload -On Lasix 40 mg daily. 4.  Acute blood loss anemia - H and H stable at 10.2 and 32.3. On Trinsicon. 5. Bilateral PE pre op-on Heparin drip 6. Hyponatremia resolved-sodium decreased to 137. Likely related to diuresis 7. Previously on renal dose dopamine drip. Creatinine this am remains 1.45. Creatinine upon admission was 1.66. 8. DM-CBGs 139/182/83. On Insulin. Pre op HGA1C 9.2. She will need close follow up with medical doctor after discharge. 9. Regarding sternal drainage, no erythema. Likely fat necrosis. No need for antibiotic at this time. Continue dressing changes. 10. Will remove EPW on Sunday  ZIMMERMAN,DONIELLE MPA-C 08/04/2016,8:18 AM   Ambulating better Chest x-ray with small pleural effusions-continue oral Lasix for 2 weeks Heparin ordered to be discontinued Saturday night so pacing wires can be pulled Sunday, start oral Xarelto  Monday a.m.

## 2016-08-04 NOTE — Progress Notes (Signed)
CARDIAC REHAB PHASE I   PRE:  Rate/Rhythm: 71 SR  BP:  Sitting: 113/68        SaO2: 96 RA  MODE:  Ambulation: 350 ft   POST:  Rate/Rhythm: 89 SR  BP:  Sitting: 138/77         SaO2: 95 RA  Pt ambulated 350 ft on RA, IV, rolling walker, assist x1, verbal cues for sternal precautions, mostly steady gait, tolerated well. Pt c/o DOE, numbness in her L hand, standing rest x2. Encouraged IS, additional ambulation x2 today. Pt to recliner after walk, feet elevated, call bell within reach. Will follow.   [place], RN, BSN 08/04/2016 9:56 AM

## 2016-08-05 LAB — GLUCOSE, CAPILLARY
GLUCOSE-CAPILLARY: 133 mg/dL — AB (ref 65–99)
Glucose-Capillary: 202 mg/dL — ABNORMAL HIGH (ref 65–99)
Glucose-Capillary: 147 mg/dL — ABNORMAL HIGH (ref 65–99)
Glucose-Capillary: 159 mg/dL — ABNORMAL HIGH (ref 65–99)

## 2016-08-05 LAB — HEPARIN LEVEL (UNFRACTIONATED): Heparin Unfractionated: 0.35 IU/mL (ref 0.30–0.70)

## 2016-08-05 MED ORDER — AMIODARONE HCL 200 MG PO TABS
200.0000 mg | ORAL_TABLET | Freq: Two times a day (BID) | ORAL | Status: DC
  Administered 2016-08-05 – 2016-08-07 (×6): 200 mg via ORAL
  Filled 2016-08-05 (×6): qty 1

## 2016-08-05 MED ORDER — INSULIN ASPART 100 UNIT/ML ~~LOC~~ SOLN
2.0000 [IU] | Freq: Three times a day (TID) | SUBCUTANEOUS | Status: DC
  Administered 2016-08-05 – 2016-08-07 (×7): 2 [IU] via SUBCUTANEOUS

## 2016-08-05 NOTE — Progress Notes (Signed)
CARDIAC REHAB PHASE I   PRE:  Rate/Rhythm: 74 SR  BP:   Sitting: 110/75     SaO2: 93% RA  MODE:  Ambulation: 350 ft +48 ft without walker  POST:  Rate/Rhythm: 84 SR  BP:   Sitting: 135/69     SaO2: 92% RA  1456-1530  Pt ambulated 318ft, 340ft with rolling walker, gait belt , IV assist x1, minimum verbal cues for sternal precautions. Overall, gait improved with increased movement. In the room, pt ambulated without the assist of rolling walker for 48 feet. Pt maintained a steady gait, SBA/ gait belt. Returned pt to recliner with LE elevated, VSS and call bell within reach. Practiced using IS. Pt demonstrated well. Encouraged pt to walk 3x/day. Pt voiced understanding.    Brylen Wagar D Kaleen Rochette,MS,ACSM-RCEP 08/05/2016 3:26 PM

## 2016-08-05 NOTE — Progress Notes (Signed)
ANTICOAGULATION CONSULT NOTE - Follow Up Consult  Pharmacy Consult for heparin Indication: pulmonary embolus   Labs:  Recent Labs  08/03/16 0445 08/04/16 0433 08/05/16 0436  HGB 10.2*  --   --   HCT 32.3*  --   --   PLT 279  --   --   HEPARINUNFRC 0.42 0.31 0.35  CREATININE 1.45* 1.45*  --      Assessment: 55yo female with bilateral PE oh heparin and at goal on 1200 units/hr. For removal of pacing wires on Sunday. Heparin to stop at 8pm today and plans for Xarelto on 11/27.   Goal of Therapy:  Heparin level 0.3-0.4 units/ml Platelet monitoring per protocol: yes   Plan:  -No heparin changes needed -Discontinue daily heparin levels  Hildred Laser, Pharm D 08/05/2016 11:08 AM

## 2016-08-05 NOTE — Progress Notes (Addendum)
      [address]       [place] [address]             [phone]        8 Days Post-Op Procedure(s) (LRB): CORONARY ARTERY BYPASS GRAFTING (CABG)x4 using left internal mammary and endoscopic harvest of right greater saphenous vein (N/A) TRANSESOPHAGEAL ECHOCARDIOGRAM (TEE) (N/A)  Subjective: She still has mnior drainage from lower sternal wound.  Objective: Vital signs in last 24 hours: Temp:  [97.7 F (36.5 C)-98.7 F (37.1 C)] 98.7 F (37.1 C) (11/25 0626) Pulse Rate:  [65-77] 65 (11/25 0626) Cardiac Rhythm: Normal sinus rhythm (11/24 1958) Resp:  [16-19] 16 (11/25 0626) BP: (106-137)/(66-81) 108/66 (11/25 0626) SpO2:  [92 %-98 %] 98 % (11/25 0626) Weight:  [234 lb 8 oz (106.4 kg)-234 lb 9.1 oz (106.4 kg)] 234 lb 9.1 oz (106.4 kg) (11/25 0500)  Pre op weight 107 kg Current Weight  08/05/16 234 lb 9.1 oz (106.4 kg)      Intake/Output from previous day: 11/24 0701 - 11/25 0700 In: 1504 [P.O.:1200; I.V.:304] Out: 550 [Urine:550]   Physical Exam:  Cardiovascular: RRR Pulmonary: Slightly diminished at bases Abdomen: Soft, non tender, bowel sounds present. Extremities: Trace bilateral lower extremity edema. Wounds: Clean and dry.  No erythema or signs of infection. Dressing has sero sanguinous ooze (from lower sternal wound). There is no erythema.  Lab Results: CBC:  Recent Labs  08/03/16 0445  WBC 12.1*  HGB 10.2*  HCT 32.3*  PLT 279   BMET:   Recent Labs  08/03/16 0445 08/04/16 0433  NA 132* 137  K 4.5 4.2  CL 96* 100*  CO2 26 29  GLUCOSE 247* 93  BUN 20 19  CREATININE 1.45* 1.45*  CALCIUM 8.5* 8.8*    PT/INR:  Lab Results  Component Value Date   INR 1.09 08/01/2016   INR 1.25 07/28/2016   INR 1.00 07/26/2016   ABG:  INR: Will add last result for INR, ABG once components are confirmed Will add last 4 CBG results once components are confirmed  Assessment/Plan:  1. CV - Previous a fib. Maintaining SR with HR  mostly in the 60's. S/p NSTEMI. On Amiodarone 400 mg bid and Lopressor 25 mg bid. Not on ACE secondary to elevated creatinine. Will decrease Amiodarone to 200 mg bid. 2.  Pulmonary - On room air. Encourage incentive spirometer. 3. Volume Overload -On Lasix 40 mg daily. 4.  Acute blood loss anemia - H and H stable at 10.2 and 32.3. On Trinsicon. 5. Bilateral PE pre op-on Heparin drip 6. Previously on renal dose dopamine drip. Creatinine this am remains 1.45. Creatinine upon admission was 1.66. Will recheck creatinine am 7. DM-CBGs 197/165/202. On Insulin. Will add meal coverage for better glucose control. Pre op HGA1C 9.2. She will need close follow up with medical doctor after discharge. 8. Regarding sternal drainage (minor), no erythema. Likely fat necrosis. No need for antibiotic at this time. Continue dressing changes. 9. Will remove EPW on Sunday-Heparin drip to be stopped Saturday night for this. Per Dr. Prescott Gum, restart Xarelto on Monday.  ZIMMERMAN,DONIELLE MPA-C 08/05/2016,8:32 AM    Chart reviewed, patient examined, agree with above. Chest dressing changed. The incision looks fine with only dried drainage on dressing. Looks like fat necrosis.

## 2016-08-06 LAB — BASIC METABOLIC PANEL WITH GFR
Anion gap: 9 (ref 5–15)
BUN: 23 mg/dL — ABNORMAL HIGH (ref 6–20)
CO2: 27 mmol/L (ref 22–32)
Calcium: 8.5 mg/dL — ABNORMAL LOW (ref 8.9–10.3)
Chloride: 98 mmol/L — ABNORMAL LOW (ref 101–111)
Creatinine, Ser: 1.69 mg/dL — ABNORMAL HIGH (ref 0.44–1.00)
GFR calc Af Amer: 39 mL/min — ABNORMAL LOW
GFR calc non Af Amer: 33 mL/min — ABNORMAL LOW
Glucose, Bld: 167 mg/dL — ABNORMAL HIGH (ref 65–99)
Potassium: 4.8 mmol/L (ref 3.5–5.1)
Sodium: 134 mmol/L — ABNORMAL LOW (ref 135–145)

## 2016-08-06 LAB — GLUCOSE, CAPILLARY
GLUCOSE-CAPILLARY: 151 mg/dL — AB (ref 65–99)
GLUCOSE-CAPILLARY: 129 mg/dL — AB (ref 65–99)
GLUCOSE-CAPILLARY: 156 mg/dL — AB (ref 65–99)
Glucose-Capillary: 133 mg/dL — ABNORMAL HIGH (ref 65–99)

## 2016-08-06 MED ORDER — FUROSEMIDE 40 MG PO TABS: 40.0000 mg | ORAL_TABLET | Freq: Every day | ORAL | Status: DC

## 2016-08-06 NOTE — Progress Notes (Signed)
Removed wires at 1115 with no resistance sites did have watery light blood, and patient tolerated procedure well.

## 2016-08-06 NOTE — Progress Notes (Addendum)
      [address]       [place] [address]             [phone]        9 Days Post-Op Procedure(s) (LRB): CORONARY ARTERY BYPASS GRAFTING (CABG)x4 using left internal mammary and endoscopic harvest of right greater saphenous vein (N/A) TRANSESOPHAGEAL ECHOCARDIOGRAM (TEE) (N/A)  Subjective: She still has mnior drainage from lower sternal wound.  Objective: Vital signs in last 24 hours: Temp:  [98.4 F (36.9 C)-98.7 F (37.1 C)] 98.4 F (36.9 C) (11/26 0503) Pulse Rate:  [67-70] 67 (11/26 0503) Cardiac Rhythm: Normal sinus rhythm (11/25 1920) Resp:  [18] 18 (11/26 0503) BP: (115-131)/(64-70) 127/70 (11/26 0503) SpO2:  [95 %-99 %] 95 % (11/26 0503) Weight:  [232 lb 11.2 oz (105.6 kg)] 232 lb 11.2 oz (105.6 kg) (11/26 0503)  Pre op weight 107 kg Current Weight  08/06/16 232 lb 11.2 oz (105.6 kg)      Intake/Output from previous day: 11/25 0701 - 11/26 0700 In: 1527.6 [P.O.:1200; I.V.:227.6] Out: -    Physical Exam:  Cardiovascular: RRR Pulmonary: Slightly diminished at bases Abdomen: Soft, non tender, bowel sounds present. Extremities: Trace bilateral lower extremity edema. Wounds: Clean and dry.  No erythema or signs of infection. Dressing has sero sanguinous ooze (from lower sternal wound). There is no erythema.  Lab Results: CBC: No results for input(s): WBC, HGB, HCT, PLT in the last 72 hours. BMET:   Recent Labs  08/04/16 0433 08/06/16 0523  NA 137 134*  K 4.2 4.8  CL 100* 98*  CO2 29 27  GLUCOSE 93 167*  BUN 19 23*  CREATININE 1.45* 1.69*  CALCIUM 8.8* 8.5*    PT/INR:  Lab Results  Component Value Date   INR 1.09 08/01/2016   INR 1.25 07/28/2016   INR 1.00 07/26/2016   ABG:  INR: Will add last result for INR, ABG once components are confirmed Will add last 4 CBG results once components are confirmed  Assessment/Plan:  1. CV - Previous a fib. Maintaining SR with HR mostly in the 60's. S/p NSTEMI. On Amiodarone  200 mg bid and Lopressor 25 mg bid. Not on ACE secondary to elevated creatinine.  2.  Pulmonary - On room air. Encourage incentive spirometer. 3. Volume Overload -On Lasix 40 mg daily. Will hold today as creatinine increased. 4.  Acute blood loss anemia - H and H stable at 10.2 and 32.3. On Trinsicon. 5. Bilateral PE pre op 6. Previously on renal dose dopamine drip. Creatinine increased to 1.69. Creatinine upon admission was 1.66. Check in am. 7. DM-CBGs [id number]. On scheduled Insulin and meal coverage. Pre op HGA1C 9.2. She will need close follow up with medical doctor after discharge. 8. Regarding sternal drainage (minor), no erythema. Likely fat necrosis. No need for antibiotic at this time. Continue dressing changes. 9. Will remove EPW this am as Heparin stopped. Per Dr. Prescott Gum, start Xarelto in am.  ZIMMERMAN,DONIELLE MPA-C 08/06/2016,8:24 AM    Chart reviewed, patient examined, agree with above. Chest incision looks fine. She is about at baseline weight and creat up so will hold lasix today and repeat BMET in am. Pacing wires out. Resume Xarelto tomorrow.

## 2016-08-06 NOTE — Progress Notes (Signed)
Patient ambulated 150 ft around unit with RN, on room air, without walker. Patient tolerated walk well.

## 2016-08-07 LAB — BASIC METABOLIC PANEL
ANION GAP: 11 (ref 5–15)
BUN: 28 mg/dL — ABNORMAL HIGH (ref 6–20)
CALCIUM: 8.5 mg/dL — AB (ref 8.9–10.3)
CO2: 25 mmol/L (ref 22–32)
CREATININE: 1.77 mg/dL — AB (ref 0.44–1.00)
Chloride: 98 mmol/L — ABNORMAL LOW (ref 101–111)
GFR, EST AFRICAN AMERICAN: 37 mL/min — AB (ref >60)
GFR, EST NON AFRICAN AMERICAN: 32 mL/min — AB (ref >60)
Glucose, Bld: 194 mg/dL — ABNORMAL HIGH (ref 65–99)
Potassium: 4.6 mmol/L (ref 3.5–5.1)
SODIUM: 134 mmol/L — AB (ref 135–145)

## 2016-08-07 LAB — GLUCOSE, CAPILLARY
GLUCOSE-CAPILLARY: 170 mg/dL — AB (ref 65–99)
GLUCOSE-CAPILLARY: 130 mg/dL — AB (ref 65–99)
Glucose-Capillary: 143 mg/dL — ABNORMAL HIGH (ref 65–99)

## 2016-08-07 MED ORDER — FUROSEMIDE 40 MG PO TABS: 40.0000 mg | ORAL_TABLET | Freq: Every day | ORAL | 0 refills | Status: DC

## 2016-08-07 MED ORDER — RIVAROXABAN 20 MG PO TABS: 20.0000 mg | ORAL_TABLET | Freq: Every day | ORAL | Status: DC

## 2016-08-07 MED ORDER — AMIODARONE HCL 200 MG PO TABS: 200.0000 mg | ORAL_TABLET | Freq: Two times a day (BID) | ORAL | 1 refills | Status: DC

## 2016-08-07 MED ORDER — ASPIRIN 325 MG PO TBEC: 325.0000 mg | DELAYED_RELEASE_TABLET | Freq: Every day | ORAL | 0 refills | Status: DC

## 2016-08-07 MED ORDER — RIVAROXABAN 20 MG PO TABS
20.0000 mg | ORAL_TABLET | Freq: Every day | ORAL | Status: DC
  Administered 2016-08-07: 20 mg via ORAL
  Filled 2016-08-07: qty 1

## 2016-08-07 MED ORDER — POTASSIUM CHLORIDE ER 20 MEQ PO TBCR: 10.0000 meq | EXTENDED_RELEASE_TABLET | Freq: Every day | ORAL | 0 refills | Status: DC

## 2016-08-07 MED ORDER — FE FUMARATE-B12-VIT C-FA-IFC PO CAPS: 1.0000 | ORAL_CAPSULE | Freq: Three times a day (TID) | ORAL | 1 refills | Status: DC

## 2016-08-07 MED ORDER — OXYCODONE HCL 5 MG PO TABS: 5.0000 mg | ORAL_TABLET | ORAL | 0 refills | Status: DC | PRN

## 2016-08-07 NOTE — Progress Notes (Signed)
CARDIAC REHAB PHASE I   PRE:  Rate/Rhythm: 64 SR    BP: sitting 137/65    SaO2: 95 RA  MODE:  Ambulation: 350 ft   POST:  Rate/Rhythm: 84 SR    BP: sitting 141/93     SaO2: 95 RA  Tolerated well, moving well. Tired toward end. She has only been walking 150 ft. No RW needed. To recliner. Sts she is going home. Will ed with daughter this pm. [place], ACSM 08/07/2016 9:29 AM

## 2016-08-07 NOTE — Progress Notes (Signed)
      [address]       [place] [address]             [phone]      10 Days Post-Op Procedure(s) (LRB): CORONARY ARTERY BYPASS GRAFTING (CABG)x4 using left internal mammary and endoscopic harvest of right greater saphenous vein (N/A) TRANSESOPHAGEAL ECHOCARDIOGRAM (TEE) (N/A)   Subjective:  No new complaints.  Feels okay.  + ambulation + BM  Objective: Vital signs in last 24 hours: Temp:  [97.4 F (36.3 C)-98.3 F (36.8 C)] 98.2 F (36.8 C) (11/27 0400) Pulse Rate:  [63-76] 76 (11/27 0400) Cardiac Rhythm: Normal sinus rhythm (11/26 1957) Resp:  [18] 18 (11/27 0400) BP: (119-135)/(54-80) 134/68 (11/27 0400) SpO2:  [95 %-98 %] 95 % (11/27 0400) Weight:  [232 lb 9.6 oz (105.5 kg)] 232 lb 9.6 oz (105.5 kg) (11/27 0400)  Intake/Output from previous day: 11/26 0701 - 11/27 0700 In: 1200 [P.O.:1200] Out: -   General appearance: alert, cooperative and no distress Heart: regular rate and rhythm Lungs: clear to auscultation bilaterally Abdomen: soft, non-tender; bowel sounds normal; no masses,  no organomegaly Extremities: edema 1+  Wound: clean and dry  Lab Results: No results for input(s): WBC, HGB, HCT, PLT in the last 72 hours. BMET:  Recent Labs  08/06/16 0523 08/07/16 0609  NA 134* 134*  K 4.8 4.6  CL 98* 98*  CO2 27 25  GLUCOSE 167* 194*  BUN 23* 28*  CREATININE 1.69* 1.77*  CALCIUM 8.5* 8.5*    PT/INR: No results for input(s): LABPROT, INR in the last 72 hours. ABG    Component Value Date/Time   PHART 7.379 07/29/2016 0809   HCO3 23.3 07/29/2016 0809   TCO2 27 07/31/2016 1729   ACIDBASEDEF 2.0 07/29/2016 0809   O2SAT 97.0 07/29/2016 0809   CBG (last 3)   Recent Labs  08/06/16 1648 08/06/16 2201 08/07/16 0611  GLUCAP 129* 156* 170*    Assessment/Plan: S/P Procedure(s) (LRB): CORONARY ARTERY BYPASS GRAFTING (CABG)x4 using left internal mammary and endoscopic harvest of right greater saphenous vein (N/A) TRANSESOPHAGEAL  ECHOCARDIOGRAM (TEE) (N/A)  1. CV- H/O A. Fib, currently NSR, continue Amiodarone,  Lopressor, restart Xarelto 2. Pulm- off oxygen, no acute issues, continue IS 3. Renal- creatinine up to 1.77, weight remains elevated.. Will hold Lasix with continued increase in creatinine, repeat BMET in AM 4. DM- sugars controlled, continue insulin 5. Dispo- patient with continued elevation in creatinine, weight is elevated, repeat BMET in AM.... Hold Lasix for now, Xarelto restarted   LOS: 14 days    Ahmed Prima, Junie Panning 08/07/2016

## 2016-08-07 NOTE — Care Management Note (Signed)
Case Management Note Original Note Created by Michail Jewels 07/24/16  Patient Details  Name: Tamara Henry MRN: [id number] Date of Birth: [birth year]  Subjective/Objective:   Pt presented fr unstable angina. Pt is from home with daughter. Pt is currently active with [facility] and has [facility] RN, PT, OT, ST. Pt will need resumption orders if the plan continues to be home once stable. Zayleigh with [facility] is aware that pt is hospitalized.             Action/Plan: CM did speak with pt in regards to DME. Pt states she will benefit from a new RW with [facility]. CM will continue to monitor for additional needs.   Expected Discharge Date:                  Expected Discharge Plan:  [place]  In-House Referral:  NA  Discharge planning Services  CM Consult  Post Acute Care Choice:  Home Health, Resumption of Svcs/PTA Provider, Durable Medical Equipment Choice offered to:  Patient  DME Arranged:  [facility] rolling with [facility] DME Agency:  [facility] Arranged: RN, PT, OT ST  [facility] Agency:  [facility]  Status of Service:  Completed, signed off  If discussed at [place] of Stay Meetings, dates discussed:    Additional Comments:  08/07/16- 1200- Nathaniel Yaden RN, CM- received msg from PA- Erin Barrett- regarding pt's plan for d/c for today- orders for [facility] have been placed along with DME-rollator- pt will need labwork on 11/30- spoke with Levada Dy at [facility] regarding pt's [place] needs and resumption of care-will add Telehealth. Call made to [facility] with [facility] for DME need- they are out of rollators in house - pt will need to go to [facility] store to pick rollator up- order printed and given to pt to take to store.    Maryclare Labrador, RN 08/01/2016, 3:09 PM CM assessed pt, pt alert and oriented.  Pt states she stays with her daughter and confirmed that she is active with [facility] - pt would like for [facility] to be the agency of choice once orders are  written.  Pt states her daughter doesn't work and will be with her 24 hours post discharge as recommended. Previous unit CM has already asked for resumption HH orders and requested DME via physician sticky note  07/31/16 Pt is now 3 days s/p CABG, remains positive in net weight, metalzone will be added to lasix and coumadin regimen started today.  Pt not oriented - bedside nurse informed.  Family friend confirmed that PTA from home with daughter.  CM will continue to follow for discharge needs   Dawayne Patricia, RN 08/07/2016, 12:19 PM [phone]

## 2016-08-07 NOTE — Progress Notes (Signed)
Patient progressing well post CABG. Creatinine up today 1.77. Maintaining NSR on amiodarone. On Xarelto given history of PE in September. Would consider reducing ASA to 81 mg daily. Anticipate DC in am if renal function stable. Follow up with Dr. Radford Pax post discharge.  Tamara [place] MD, [facility]

## 2016-08-07 NOTE — Care Management Important Message (Signed)
Important Message  Patient Details  Name: Tamara Henry MRN: [id number] Date of Birth: 05-17-1961   Medicare Important Message Given:  Yes    Nathen May 08/07/2016, 11:32 AM

## 2016-08-07 NOTE — Progress Notes (Signed)
Spoke with Ms. Corso who stated Dr. Prescott Gum told her she could go home.  I was concerned with her creatinine being elevated at 1.7.  I reviewed this with Dr. Prescott Gum, who states the patient has a baseline creatinine elevation of 1.6.  He stated she could be discharged home on Lasix 40 mg daily with a repeat BMET on Thursday via the [place] nurse.

## 2016-08-07 NOTE — Progress Notes (Signed)
Ed completed with pt and daughter. Daughter very receptive, pt somewhat. Will send referral to [facility]. Will set up d/c video. [place], ACSM 2:22 PM 08/07/2016

## 2016-08-08 ENCOUNTER — Telehealth: Payer: Self-pay | Admitting: Cardiology

## 2016-08-08 DIAGNOSIS — I2510 Atherosclerotic heart disease of native coronary artery without angina pectoris: Secondary | ICD-10-CM

## 2016-08-08 DIAGNOSIS — S01502D Unspecified open wound of oral cavity, subsequent encounter: Secondary | ICD-10-CM | POA: Diagnosis not present

## 2016-08-08 DIAGNOSIS — H2513 Age-related nuclear cataract, bilateral: Secondary | ICD-10-CM | POA: Diagnosis not present

## 2016-08-08 DIAGNOSIS — E119 Type 2 diabetes mellitus without complications: Secondary | ICD-10-CM | POA: Diagnosis not present

## 2016-08-08 DIAGNOSIS — S025XXD Fracture of tooth (traumatic), subsequent encounter for fracture with routine healing: Secondary | ICD-10-CM | POA: Diagnosis not present

## 2016-08-08 DIAGNOSIS — Z9181 History of falling: Secondary | ICD-10-CM | POA: Diagnosis not present

## 2016-08-08 NOTE — Telephone Encounter (Signed)
LMTCB

## 2016-08-08 NOTE — Telephone Encounter (Signed)
New message      Not sure if this is a TCM.  Pt called to schedule a hosp follow up in 1 week from discharge.  She was admitted for a heart attack and had open heart surgery.  Sending message as TCM.

## 2016-08-09 ENCOUNTER — Emergency Department (HOSPITAL_COMMUNITY)
Admission: EM | Admit: 2016-08-09 | Discharge: 2016-08-10 | Disposition: A | Discharge status: Home / Self Care | Payer: Medicare Other | Attending: Emergency Medicine | Admitting: Emergency Medicine

## 2016-08-09 ENCOUNTER — Other Ambulatory Visit: Payer: Self-pay

## 2016-08-09 ENCOUNTER — Encounter (HOSPITAL_COMMUNITY): Payer: Self-pay | Admitting: *Deleted

## 2016-08-09 DIAGNOSIS — Z7982 Long term (current) use of aspirin: Secondary | ICD-10-CM | POA: Diagnosis not present

## 2016-08-09 DIAGNOSIS — N179 Acute kidney failure, unspecified: Secondary | ICD-10-CM | POA: Diagnosis not present

## 2016-08-09 DIAGNOSIS — E1140 Type 2 diabetes mellitus with diabetic neuropathy, unspecified: Secondary | ICD-10-CM | POA: Diagnosis not present

## 2016-08-09 DIAGNOSIS — R079 Chest pain, unspecified: Secondary | ICD-10-CM | POA: Diagnosis not present

## 2016-08-09 DIAGNOSIS — Z87891 Personal history of nicotine dependence: Secondary | ICD-10-CM | POA: Diagnosis not present

## 2016-08-09 DIAGNOSIS — Z794 Long term (current) use of insulin: Secondary | ICD-10-CM | POA: Diagnosis not present

## 2016-08-09 DIAGNOSIS — E1165 Type 2 diabetes mellitus with hyperglycemia: Secondary | ICD-10-CM | POA: Insufficient documentation

## 2016-08-09 DIAGNOSIS — Z7901 Long term (current) use of anticoagulants: Secondary | ICD-10-CM | POA: Insufficient documentation

## 2016-08-09 DIAGNOSIS — R739 Hyperglycemia, unspecified: Secondary | ICD-10-CM

## 2016-08-09 LAB — URINALYSIS, ROUTINE W REFLEX MICROSCOPIC
Bilirubin Urine: NEGATIVE
Glucose, UA: NEGATIVE mg/dL
KETONES UR: NEGATIVE mg/dL
LEUKOCYTES UA: NEGATIVE
NITRITE: NEGATIVE
PROTEIN: 30 mg/dL — AB
Specific Gravity, Urine: 1.008 (ref 1.005–1.030)
pH: 5.5 (ref 5.0–8.0)

## 2016-08-09 LAB — BASIC METABOLIC PANEL
Anion gap: 9 (ref 5–15)
BUN: 36 mg/dL — AB (ref 6–20)
CHLORIDE: 99 mmol/L — AB (ref 101–111)
CO2: 22 mmol/L (ref 22–32)
CREATININE: 1.99 mg/dL — AB (ref 0.44–1.00)
Calcium: 8.1 mg/dL — ABNORMAL LOW (ref 8.9–10.3)
GFR, EST AFRICAN AMERICAN: 32 mL/min — AB (ref >60)
GFR, EST NON AFRICAN AMERICAN: 27 mL/min — AB (ref >60)
Glucose, Bld: 184 mg/dL — ABNORMAL HIGH (ref 65–99)
Potassium: 4.7 mmol/L (ref 3.5–5.1)
SODIUM: 130 mmol/L — AB (ref 135–145)

## 2016-08-09 LAB — CBC
HCT: 32.2 % — ABNORMAL LOW (ref 36.0–46.0)
Hemoglobin: 10.2 g/dL — ABNORMAL LOW (ref 12.0–15.0)
MCH: 28.5 pg (ref 26.0–34.0)
MCHC: 31.7 g/dL (ref 30.0–36.0)
MCV: 89.9 fL (ref 78.0–100.0)
PLATELETS: 355 10*3/uL (ref 150–400)
RBC: 3.58 MIL/uL — AB (ref 3.87–5.11)
RDW: 15.3 % (ref 11.5–15.5)
WBC: 12.8 10*3/uL — AB (ref 4.0–10.5)

## 2016-08-09 LAB — URINE MICROSCOPIC-ADD ON

## 2016-08-09 LAB — I-STAT TROPONIN, ED: TROPONIN I, POC: 0.14 ng/mL — AB (ref 0.00–0.08)

## 2016-08-09 LAB — CBG MONITORING, ED: GLUCOSE-CAPILLARY: 162 mg/dL — AB (ref 65–99)

## 2016-08-09 MED ORDER — SODIUM CHLORIDE 0.9 % IV BOLUS (SEPSIS)
1000.0000 mL | Freq: Once | INTRAVENOUS | Status: AC
  Administered 2016-08-10: 1000 mL via INTRAVENOUS

## 2016-08-09 NOTE — ED Notes (Signed)
Dr. Vanita Panda notified on pt.'s elevated Troponin result , will move pt. to a room as soon as possible .

## 2016-08-09 NOTE — ED Triage Notes (Signed)
Pt post op x 2 weeks for quadruple bypass. Pt reports feeling a clicking sound in her chest, denies pain. Has been feeling fine after surgery. Pt also concerned for elevated blood sugars above 250. Pt is taking novolog and lantus. Also reports mild chills, highest temp being 99.8 yesterday.

## 2016-08-09 NOTE — ED Provider Notes (Signed)
TIME SEEN:  By signing my name below, I, Arianna Nassar, attest that this documentation has been prepared under the direction and in the presence of [employer], DO.  Electronically Signed: Julien Nordmann, ED Scribe. 08/09/16. 11:50 PM.   CHIEF COMPLAINT:  Chief Complaint  Patient presents with  . Hyperglycemia  . Post-op Problem     HPI:  HPI Comments: Tamara Henry is a 55 y.o. female who has a PMhx of CABG x 4, DM, HTN, HLD, bilateral PEs who presents to the Emergency Department presenting with Multiple different complaints.   First, patient is complaining of "clicking in chest" for the past two weeks. She had a coronary artery bypass grafting performed by Dr. Prescott Gum on 07/28/16. She says her "clicking" is worse at night when she lays down And tries to get a sleep. She has a follow up appointment with Dr.Van Trigt on 12/07. She denies chest pain, chest discomfort, shortness of breath, dizziness, palpitations, diaphoresis. Denies fever or cough.  Second, patient is complaining of a intermittent nosebleed. Pt is back on her Xarelto since her surgery and reports having a nose bleed that has not been controlled all day. No injury to the nose. No nodes or facial pain. Has not been picking her putting anything into her nose. States her nosebleed did stop prior to arrival. She has been compliant with her Xarelto and has not missed any doses.   Third, patient is complaining of intermittent hyperglycemia.  She also complains of hyperglycemia for the past two weeks after her her quadruple bypass. She notes her blood sugars have been above the 250s. Pt notes her blood sugar normally runs in the 300s-400s and often read "high" on her glucometer at home prior to her last admission. States that her medications were adjusted and her blood sugars have been under better control.  She is currently on Novolog and Lantus.   Pt had a blood transfusion last week due to her hemoglobin levels being low.  Denies dizziness. Denies generalized weakness. Her daughter wanted to have her hemoglobin rechecked.   Pt does not have any chest pain, shortness of breath, or fevers over 100.4.     ROS: See HPI Constitutional: no fever  Eyes: no drainage  ENT: no runny nose   Cardiovascular:  no chest pain  Resp: no SOB  GI: no vomiting GU: no dysuria Integumentary: no rash  Allergy: no hives  Musculoskeletal: no leg swelling  Neurological: no slurred speech ROS otherwise negative  PAST MEDICAL HISTORY/PAST SURGICAL HISTORY:  Past Medical History:  Diagnosis Date  . Acid reflux   . Diabetes mellitus without complication ([place])   . High cholesterol   . Hx of chest tube placement right   . Hypertension   . Neuropathy ([place])   . Pneumonia     MEDICATIONS:  Prior to Admission medications   Medication Sig Start Date End Date Taking? Authorizing Provider  amiodarone (PACERONE) 200 MG tablet Take 1 tablet (200 mg total) by mouth 2 (two) times daily after a meal. For 7 days, then decrease to once a day 08/07/16   Erin R Barrett, PA-C  aspirin EC 325 MG EC tablet Take 1 tablet (325 mg total) by mouth daily. 08/08/16   Erin R Barrett, PA-C  blood glucose meter kit and supplies Dispense based on patient and insurance preference. Use four times with each meal and at bedtime. (FOR ICD-9 250.00, 250.01). 05/19/16   Mir Marry Guan, MD  clotrimazole (LOTRIMIN) 1 %  cream Apply to affected area 2 times daily Patient not taking: Reported on 07/23/2016 06/03/16   Nehemiah Settle, NP  ezetimibe-simvastatin (VYTORIN) 10-10 MG tablet Take 1 tablet by mouth daily.    Historical Provider, MD  ferrous [id number] C-folic acid (TRINSICON / FOLTRIN) capsule Take 1 capsule by mouth 3 (three) times daily after meals. 08/07/16   Erin R Barrett, PA-C  fluconazole (DIFLUCAN) 200 MG tablet Take one 231m tablet on day one and take second 2067mtablet on day 4. Patient not taking: Reported on 07/23/2016  06/03/16   CaNehemiah SettleNP  furosemide (LASIX) 40 MG tablet Take 1 tablet (40 mg total) by mouth daily. 08/07/16   Erin R Barrett, PA-C  gabapentin (NEURONTIN) 400 MG capsule Take 400 mg by mouth 3 (three) times daily. 03/16/16   Historical Provider, MD  insulin aspart (NOVOLOG) 100 UNIT/ML injection Inject 5 Units into the skin 2 (two) times daily before a meal.     Historical Provider, MD  insulin glargine (LANTUS) 100 UNIT/ML injection Inject 0.15 mLs (15 Units total) into the skin 2 (two) times daily. Patient taking differently: Inject 15 Units into the skin 2 (two) times daily before a meal.  05/19/16   Mir MoMarry GuanMD  MELATONIN PO Take 1 tablet by mouth at bedtime.     Historical Provider, MD  methocarbamol (ROBAXIN) 750 MG tablet Take 1,500 mg by mouth 2 (two) times daily as needed for muscle spasms.    Historical Provider, MD  metoprolol tartrate (LOPRESSOR) 25 MG tablet Take 25 mg by mouth 2 (two) times daily.    Historical Provider, MD  omeprazole (PRILOSEC) 40 MG capsule Take 40 mg by mouth daily. 03/16/16   Historical Provider, MD  oxyCODONE (OXY IR/ROXICODONE) 5 MG immediate release tablet Take 1-2 tablets (5-10 mg total) by mouth every 3 (three) hours as needed for severe pain. 08/07/16   Erin R Barrett, PA-C  PARoxetine (PAXIL) 40 MG tablet Take 40 mg by mouth daily with breakfast.    Historical Provider, MD  potassium chloride 20 MEQ TBCR Take 10 mEq by mouth daily. 08/07/16   Erin R Barrett, PA-C  Probiotic Product (ALIGN) 4 MG CAPS Take 1 capsule (4 mg total) by mouth daily. 06/03/16   CaNehemiah SettleNP  rivaroxaban (XARELTO) 20 MG TABS tablet Take 20 mg by mouth every morning.    Historical Provider, MD  Rivaroxaban 15 & 20 MG TBPK Take as directed on package: Start with one 1553mablet by mouth twice a day with food. On Day 22, switch to one 39m82mblet once a day with food. Patient not taking: Reported on 07/23/2016 05/19/16   Mir MohaMarry Guan     ALLERGIES:  Allergies  Allergen Reactions  . Lactose Intolerance (Gi) Diarrhea  . Lisinopril Other (See Comments) and Cough    Inflammation, coughing  . Reglan [Metoclopramide] Other (See Comments)    REACTION IS SIDE EFFECT Pt stated having lock jaw as a side effect  . Wellbutrin [Bupropion] Nausea And Vomiting and Cough  . Latex Rash  . Metformin And Related Diarrhea  . Penicillins Rash    [FROM PREVIOUS ENTRY-BEFORE 07/27/16] >>"ALL CILLINS"  Has patient had a PCN reaction causing immediate rash, facial/tongue/throat swelling, SOB or lightheadedness with hypotension: Yes Has patient had a PCN reaction causing severe rash involving mucus membranes or skin necrosis: No Has patient had a PCN reaction that required hospitalization No Has patient had a PCN reaction occurring within the  last 10 years: Yes If all of the above answers are "NO", then may proceed with Cephalosporin use.   . Sulfa Antibiotics Itching  . Tape Rash    SOCIAL HISTORY:  Social History  Substance Use Topics  . Smoking status: Former Smoker    Quit date: 09/11/2005  . Smokeless tobacco: Never Used  . Alcohol use No    FAMILY HISTORY: Family History  Problem Relation Age of Onset  . Pancreatitis Mother   . Diabetes type II Mother   . CAD Father   . Diabetes type II Father   . Diabetes type II Sister   . Diabetes type II Brother     EXAM: BP 111/59 (BP Location: Left Arm)   Pulse 65   Temp 98.4 F (36.9 C) (Oral)   Resp 18   LMP 07/22/2016   SpO2 100%  CONSTITUTIONAL: Alert and oriented and responds appropriately to questions. Well-appearing; well-nourished; obese HEAD: Normocephalic EYES: Conjunctivae clear, PERRL, EOMI ENT: normal nose; no rhinorrhea; moist mucous membranes, No blood in the posterior oropharynx, no blood noted in the bilateral nostrils, no septal hematoma NECK: Supple, no meningismus, no nuchal rigidity, no LAD  CARD: RRR; S1 and S2 appreciated; no murmurs, no clicks,  no rubs, no gallops CHEST: healing sternotomy scar with no erythema, warmth, drainage, or bleed; minimally TTP, no crepitus, ecchymosis, or deformity RESP: Normal chest excursion without splinting or tachypnea; breath sounds clear and equal bilaterally; no wheezes, no rhonchi, no rales, no hypoxia or respiratory distress, speaking full sentences ABD/GI: Normal bowel sounds; non-distended; soft, non-tender, no rebound, no guarding, no peritoneal signs, no hepatosplenomegaly BACK:  The back appears normal and is non-tender to palpation, there is no CVA tenderness EXT: Normal ROM in all joints; non-tender to palpation; no edema; normal capillary refill; no cyanosis, no calf tenderness or swelling, no redness, warmth or other abnormalities noted to the right leg where she had her greater saphenous vein removed    SKIN: Normal color for age and race; warm; no rash NEURO: Moves all extremities equally, sensation to light touch intact diffusely, cranial nerves II through XII intact, normal speech PSYCH: The patient's mood and manner are appropriate. Grooming and personal hygiene are appropriate.  MEDICAL DECISION MAKING: Patient here with multiple complaints. She denies any chest pain, shortness of breath, dizziness, palpitations, diaphoresis. Reports feeling and hearing a clicking noise when she is trying to sleep since her bypass surgery. Discussed with her that this is likely from her sternotomy wires and that she had to have her chest cut open in her sternum is healing. Sternotomy scar looks fantastic today. Minimally tender over this area. She is on Xarelto. Doubt that this is a new PE. Doubt ACS. EKG shows no new ischemic abnormality. She has follow-up with her cardiothoracic surgeon next week. She does not have any valvular replacement. States that this clicking has been present this entire time that she came tonight to have it evaluated.   Family also concerned about hyperglycemia. Currently blood  sugar looks fantastic and she is not in DKA. Have advised her to watch her diet closely and continue her insulin as prescribed. It sounds like her blood sugars have been improving since her last admission. Family reports they were often in the 300s to 400s and now are in the 200s. Currently blood glucoses in the 160s.    Patient also having intermittent nosebleed today. Nocurrently. No sign of septal hematoma. No injury to the nose. Discussed with her that this  is likely from a dry air and have recommended humidifier. Recommend she not put anything into her nose other than nasal saline in that she can use a humidifier. She does not need intervention at this time given she is not bleeding. Have discussed with family if she begins to bleed she can use Afrin over-the-counter and hold pressure for 20 minutes without letting go. Also discussed with him that this likely because of her Xarelto which she needs to continue because of her bilateral pulmonary emboli.    Will check labs on her in the emergency department. We'll obtain chest x-ray.   EKG Interpretation  Date/Time:  Wednesday August 09 2016 22:42:52 EST Ventricular Rate:  65 PR Interval:  170 QRS Duration: 88 QT Interval:  450 QTC Calculation: 468 R Axis:   88 Text Interpretation:  Normal sinus rhythm Cannot rule out Anterior infarct , age undetermined Abnormal ECG No significant change since last tracing Confirmed by Carrson Lightcap,  DO, Rollen Selders ([id number]) on 08/09/2016 11:39:19 PM      ED PROGRESS:  1:17 AM Pt states that she is feeling much better. Patient's labs show mildly elevated creatinine. This is slowly trending up. We have given her a liter of IV fluids and recommended close follow-up with her PCP for this. Have advised her to avoid NSAIDs. She is on a full dose aspirin for cardioprotection which I have recommended she continue.  Hemoglobin is improving and is greater than 10.  She does have mild elevation of her troponin but this is in  the setting of recent severe coronary artery disease with a four-vessel bypass. Very low suspicion that she is having an acute coronary event as she adamantly denies chest pain, chest discomfort, shortness of breath, dizziness, diaphoresis, syncopal events. Discussed with her that the "clicking" that she is hearing is likely from her recent surgery. I feel she is safe to be discharged with close outpatient follow-up with her cardiothoracic surgeon as scheduled next week.  I have discussed this finding with patient and her daughter. Discussed with them that this also might be slightly elevated because of her creatinine being elevated.  Blood glucose is in the 120s. I recommended again close diet control and compliance with her medications. I do not feel she needs any adjustment of her medication at this time.  I have discussed at length return precautions with patient and her family at bedside. They're comfortable with this plan and she does have outpatient follow-up scheduled.   At this time, I do not feel there is any life-threatening condition present. I have reviewed and discussed all results (EKG, imaging, lab, urine as appropriate) and exam findings with patient/family. I have reviewed nursing notes and appropriate previous records.  I feel the patient is safe to be discharged home without further emergent workup and can continue workup as an outpatient as needed. Discussed usual and customary return precautions. Patient/family verbalize understanding and are comfortable with this plan.  Outpatient follow-up has been provided. All questions have been answered.   I personally performed the services described in this documentation, which was scribed in my presence. The recorded information has been reviewed and is accurate.     [place], DO 08/10/16 [phone]

## 2016-08-10 ENCOUNTER — Emergency Department (HOSPITAL_COMMUNITY): Payer: Medicare Other

## 2016-08-10 ENCOUNTER — Telehealth: Payer: Self-pay | Admitting: Cardiology

## 2016-08-10 DIAGNOSIS — S01502D Unspecified open wound of oral cavity, subsequent encounter: Secondary | ICD-10-CM | POA: Diagnosis not present

## 2016-08-10 DIAGNOSIS — H2513 Age-related nuclear cataract, bilateral: Secondary | ICD-10-CM | POA: Diagnosis not present

## 2016-08-10 DIAGNOSIS — R079 Chest pain, unspecified: Secondary | ICD-10-CM | POA: Diagnosis not present

## 2016-08-10 DIAGNOSIS — E119 Type 2 diabetes mellitus without complications: Secondary | ICD-10-CM | POA: Diagnosis not present

## 2016-08-10 DIAGNOSIS — S025XXD Fracture of tooth (traumatic), subsequent encounter for fracture with routine healing: Secondary | ICD-10-CM | POA: Diagnosis not present

## 2016-08-10 DIAGNOSIS — E1165 Type 2 diabetes mellitus with hyperglycemia: Secondary | ICD-10-CM | POA: Diagnosis not present

## 2016-08-10 DIAGNOSIS — Z9181 History of falling: Secondary | ICD-10-CM | POA: Diagnosis not present

## 2016-08-10 LAB — CBG MONITORING, ED: GLUCOSE-CAPILLARY: 124 mg/dL — AB (ref 65–99)

## 2016-08-10 LAB — TROPONIN I: TROPONIN I: 0.17 ng/mL — AB (ref <0.03)

## 2016-08-10 NOTE — Telephone Encounter (Signed)
Received a call from [facility] RN stating difficulty obtaining blood draw at patient's home. Renal function is being followed in the outpatient setting. Was in the ED yesterday and Cr 1.9 up from 1.7. On home lasix. Reports no edema, or shortness of breath. No anginal symptoms. I instructed that she hold her lasix in the morning, as she has already taken today. Will have her call the office to see if her labs can be obtained there tomorrow, given difficult stick for [facility] RN today.

## 2016-08-10 NOTE — ED Notes (Signed)
Patient transported to X-ray 

## 2016-08-10 NOTE — ED Notes (Signed)
Pt returned from CXR; will continue to monitor

## 2016-08-11 ENCOUNTER — Other Ambulatory Visit: Payer: Medicare Other | Admitting: *Deleted

## 2016-08-11 DIAGNOSIS — I2510 Atherosclerotic heart disease of native coronary artery without angina pectoris: Secondary | ICD-10-CM

## 2016-08-11 LAB — BASIC METABOLIC PANEL
BUN: 32 mg/dL — ABNORMAL HIGH (ref 7–25)
CO2: 23 mmol/L (ref 20–31)
Calcium: 8.5 mg/dL — ABNORMAL LOW (ref 8.6–10.4)
Chloride: 102 mmol/L (ref 98–110)
Creat: 1.53 mg/dL — ABNORMAL HIGH (ref 0.50–1.05)
Glucose, Bld: 137 mg/dL — ABNORMAL HIGH (ref 65–99)
POTASSIUM: 4.5 mmol/L (ref 3.5–5.3)
SODIUM: 136 mmol/L (ref 135–146)

## 2016-08-11 NOTE — Telephone Encounter (Signed)
BMET was ordered at discharge by Dr. Prescott Gum, to be drawn by H/H nurse on 11/30.  H/H was unable to obtain blood draw.  Per Reino Bellis, NP (see phone note) lab to be drawn at [facility] office today 12/1.  I spoke with patient and advised I would enter order and for her to come straight to office now (office closing at 1130 today).  She voiced understanding and states she is on the way.

## 2016-08-11 NOTE — Telephone Encounter (Signed)
Tamara Henry is calling because they took the [facility] line out and home health nurse was not able to draw labs on her on yesterday and was told to call to find out what she will need to do. Please call

## 2016-08-14 ENCOUNTER — Telehealth: Payer: Self-pay | Admitting: Cardiology

## 2016-08-14 NOTE — Telephone Encounter (Signed)
Confirmed with patient that she is having heartburn - she states she has no cheat pain or SOB. Instructed patient to consult with her gastric MD about what she can take as patients with gastric surgery have tight restrictions. She understands to call if she tries medicine and the pain is not relieved. She was grateful for call.

## 2016-08-14 NOTE — Telephone Encounter (Signed)
Pt calling regarding having heartburn and wants to know what she can take OTC-had bypass surgery-pls advise

## 2016-08-15 ENCOUNTER — Telehealth: Payer: Self-pay

## 2016-08-15 ENCOUNTER — Encounter: Payer: Self-pay | Admitting: Physician Assistant

## 2016-08-15 DIAGNOSIS — S01502D Unspecified open wound of oral cavity, subsequent encounter: Secondary | ICD-10-CM | POA: Diagnosis not present

## 2016-08-15 DIAGNOSIS — H2513 Age-related nuclear cataract, bilateral: Secondary | ICD-10-CM | POA: Diagnosis not present

## 2016-08-15 DIAGNOSIS — S025XXD Fracture of tooth (traumatic), subsequent encounter for fracture with routine healing: Secondary | ICD-10-CM | POA: Diagnosis not present

## 2016-08-15 DIAGNOSIS — E119 Type 2 diabetes mellitus without complications: Secondary | ICD-10-CM | POA: Diagnosis not present

## 2016-08-15 DIAGNOSIS — Z951 Presence of aortocoronary bypass graft: Secondary | ICD-10-CM

## 2016-08-15 DIAGNOSIS — Z9181 History of falling: Secondary | ICD-10-CM | POA: Diagnosis not present

## 2016-08-15 NOTE — Progress Notes (Signed)
Cardiology Office Note    Date:  08/17/2016  ID:  Tamara Henry, DOB 04-Aug-1961, MRN [id number] PCP:  Philis Fendt, MD  Cardiologist:  Dr. Radford Pax   Chief Complaint: f/u CABG  History of Present Illness:  Tamara Henry is a 55 y.o. 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 She was hospitalized in September 2017 and treated for severe acute PE. She had been on several trips to the [place] and [place] in the months before this. She returned to the hospital in 07/2016 with recurrent chest pain with radiation down the left arm worse than recent PE pain. Troponin was minimally elevated at 0.04. LHC 07/25/16 showed severe 3V CAD, normal EF, normal LVEDP. 2D echo 07/25/16: mild LVH, EF 55-60%, grade 1 DD. Venous duplex was negative 07/26/16. She underwent CABGx4 on 07/28/16. Hospital course was complicated by AKI felt 2/2 ATN, ABL anemia, and poorly controlled DM. She was placed on IV amiodarone immediately after surgery which was later transitioned to oral form but it's not clear to me that she ever actually went into atrial fib post-operatively. Some of her notes indicate "history of atrial fib" or "previous afib" but she does not have a history of atrial fib. All of her physical exams report regular rate and rhythm. All of her EKGs have shown NSR. The discharge summary indicates the amiodarone was started for rhythm control. Her creatinine has fluctuated anywhere from 0.9 to 1.99 ever since 2016 so baseline is not quite clear. She was seen in the ED 08/09/16 for multiple complaints, clicking in her chest, intermittent nosebleed, intermittent hyperglycemia. Troponin was 0.17 (no recent angina), Hgb 10.2 stable post-op, LDL 169, and BUN/Cr 36/1.99 with Na of 130. Reino Bellis NP received an after-hours call from [facility] on 08/10/16 recommending to hold Lasix, and  repeat BMET on 08/11/16 showed Na 136, BUN 32, Cr 1.53. She also had recent abnormal suppressed TSH which appears to be a historic trend, but free T4 wnl most recently (previously mildly elevated, however).  She presents back to clinic for follow-up with her daughter. She has multiple complaints/questions but as a whole states she's feeling "pretty great." Her chest clicking has stopped. She does note heavier periods on the blood thinner. Blood sugar is still fluctuating. Has had some left hip pain when seated in a chair. Has noticed occasional cough, no hemoptysis. No fevers or chills. Also reported new RLE edema since surgery (this is the leg that underwent endoscopic vein harvest). Note LE duplex prior to surgery did not show any recurrent DVT. Per phone notes, a GXT has been arranged per Dr. Radford Pax to determine candidacy for rehab. She denies any palpitations or syncope.   Past Medical History:  Diagnosis Date  . Acid reflux   . Bilateral pulmonary embolism ([place]) 05/2016  . CAD in native artery    a. s/p CABGx4 07/28/16.  Marland Kitchen CKD (chronic kidney disease), stage III    borderline CKD II-III  . Depression   . Diabetes mellitus ([place])   . GERD (gastroesophageal reflux disease)   . High cholesterol   . Hx of chest tube placement right   . Hypertension   . Morbid obesity ([place])   . Neuropathy ([place])   . Pneumonia   . Postoperative anemia 07/2016    Past Surgical History:  Procedure Laterality Date  . CARDIAC CATHETERIZATION N/A 07/25/2016   Procedure:  Left Heart Cath and Coronary Angiography;  Surgeon: Peter M Martinique, MD;  Location: [facility];  Service: Cardiovascular;  Laterality: N/A;  . CORONARY ARTERY BYPASS GRAFT N/A 07/28/2016   Procedure: CORONARY ARTERY BYPASS GRAFTING (CABG)x4 using left internal mammary and endoscopic harvest of right greater saphenous vein;  Surgeon: Ivin Poot, MD;  Location: [facility];  Service: Open Heart Surgery;  Laterality: N/A;  . TEE WITHOUT  CARDIOVERSION N/A 07/28/2016   Procedure: TRANSESOPHAGEAL ECHOCARDIOGRAM (TEE);  Surgeon: Ivin Poot, MD;  Location: [facility];  Service: Open Heart Surgery;  Laterality: N/A;  . TUBAL LIGATION      Current Medications: Current Outpatient Prescriptions  Medication Sig Dispense Refill  . amiodarone (PACERONE) 200 MG tablet Take 1 tablet (200 mg total) by mouth 2 (two) times daily after a meal. For 7 days, then decrease to once a day 60 tablet 1  . aspirin EC 325 MG EC tablet Take 1 tablet (325 mg total) by mouth daily. 30 tablet 0  . blood glucose meter kit and supplies Dispense based on patient and insurance preference. Use four times with each meal and at bedtime. (FOR ICD-9 250.00, 250.01). 1 each 0  . ezetimibe-simvastatin (VYTORIN) 10-10 MG tablet Take 1 tablet by mouth daily.    . ferrous [id number] C-folic acid (TRINSICON / FOLTRIN) capsule Take 1 capsule by mouth 3 (three) times daily after meals. 90 capsule 1  . furosemide (LASIX) 40 MG tablet Take 1 tablet (40 mg total) by mouth daily. 30 tablet 0  . gabapentin (NEURONTIN) 400 MG capsule Take 400 mg by mouth 3 (three) times daily.    . insulin aspart (NOVOLOG) 100 UNIT/ML injection Inject 5 Units into the skin 2 (two) times daily before a meal.     . insulin glargine (LANTUS) 100 UNIT/ML injection Inject 0.15 mLs (15 Units total) into the skin 2 (two) times daily. (Patient taking differently: Inject 15 Units into the skin 2 (two) times daily before a meal. ) 10 mL 11  . MELATONIN PO Take 1 tablet by mouth at bedtime.     . methocarbamol (ROBAXIN) 750 MG tablet Take 1,500 mg by mouth 2 (two) times daily as needed for muscle spasms.    . metoprolol tartrate (LOPRESSOR) 25 MG tablet Take 25 mg by mouth 2 (two) times daily.    Marland Kitchen omeprazole (PRILOSEC) 40 MG capsule Take 40 mg by mouth daily.    Marland Kitchen oxyCODONE (OXY IR/ROXICODONE) 5 MG immediate release tablet Take 1-2 tablets (5-10 mg total) by mouth every 3 (three) hours as needed  for severe pain. 30 tablet 0  . PARoxetine (PAXIL) 40 MG tablet Take 40 mg by mouth daily with breakfast.    . potassium chloride 20 MEQ TBCR Take 10 mEq by mouth daily. 30 tablet 0  . Probiotic Product (ALIGN) 4 MG CAPS Take 1 capsule (4 mg total) by mouth daily. 30 capsule 2  . rivaroxaban (XARELTO) 20 MG TABS tablet Take 20 mg by mouth every morning.     No current facility-administered medications for this visit.      Allergies:   Lactose intolerance (gi); Lisinopril; Reglan [metoclopramide]; Wellbutrin [bupropion]; Latex; Metformin and related; Penicillins; Sulfa antibiotics; and Tape   Social History   Social History  . Marital status: Married    Spouse name: N/A  . Number of children: 4  . Years of education: 12   Occupational History  . disabled    Social History Main Topics  .  Smoking status: Former Smoker    Quit date: 09/11/2005  . Smokeless tobacco: Never Used  . Alcohol use No  . Drug use: No  . Sexual activity: Yes    Birth control/ protection: Surgical   Other Topics Concern  . None   Social History Narrative   Lives at home with daughter Lanelle Bal   Drinks no caffeine      Family History:  The patient's family history includes CAD in her father; Diabetes type II in her brother, father, mother, and sister; Pancreatitis in her mother.   ROS:   Please see the history of present illness.  All other systems are reviewed and otherwise negative.    PHYSICAL EXAM:   VS:  BP 122/64   Pulse 86   Ht _0  (1.626 m)   Wt 233 lb 12.8 oz (106.1 kg)   LMP 07/22/2016   SpO2 98%   BMI 40.13 kg/m   BMI: Body mass index is 40.13 kg/m. GEN: Well nourished, well developed obese AAF, in no acute distress  HEENT: normocephalic, atraumatic Neck: no JVD, carotid bruits, or masses Cardiac: RRR; no murmurs, rubs, or gallops. Sternal scar in early stages of healing, tr-1+ RLE edema. No LLE edema. Respiratory:  clear to auscultation bilaterally, normal work of  breathing GI: soft, nontender, nondistended, + BS MS: no deformity or atrophy  Skin: warm and dry, no rash. Endoscopic vein harvest site without suppuration or dehiscence.  Neuro:  Alert and Oriented x 3, Strength and sensation are intact, follows commands Psych: euthymic mood, full affect  Wt Readings from Last 3 Encounters:  08/17/16 233 lb 12.8 oz (106.1 kg)  08/07/16 232 lb 9.6 oz (105.5 kg)  05/20/16 240 lb 1.3 oz (108.9 kg)      Studies/Labs Reviewed:   EKG:  EKG was ordered today and personally reviewed by me and demonstrates NSR 79bpm nonspecific TW changes with diffuse TWI, QTc 464m  Recent Labs: 07/26/2016: TSH 0.197 07/29/2016: Magnesium 1.7 08/02/2016: ALT 29 08/09/2016: Hemoglobin 10.2; Platelets 355 08/11/2016: BUN 32; Creat 1.53; Potassium 4.5; Sodium 136   Lipid Panel    Component Value Date/Time   CHOL 265 (H) 07/23/2016 1210   TRIG 277 (H) 07/23/2016 1210   HDL 41 07/23/2016 1210   CHOLHDL 6.5 07/23/2016 1210   VLDL 55 (H) 07/23/2016 1210   LDLCALC 169 (H) 07/23/2016 1210    Additional studies/ records that were reviewed today include: Summarized above.    ASSESSMENT & PLAN:   1. CAD - doing well overall post CABG. Per Dr. TRadford Paxhas GXT scheduled next week to determine if she qualifies for rehab. She has f/u with [facility] in several weeks. At that time would recommend decreasing ASA to 846mdaily if OK with surgeon since she is on concomitant Xarelto. Continue BB. See below regarding statin. I reviewed her record extensively. See above. It does not appear she has ever had atrial fibrillation. Will stop amiodarone. Reviewed with Dr. TuRadford Pax2. H/o bilateral PE - continue Xarelto. Ultimate duration of this will be at the discretion of primary care once established. 3. CKD III - recheck labs today. 4. Hyperlipidemia - LDL in the hospital was markedly elevated at 169. Will d/c Vytorin and start atorvastatin 8059mpm. Recheck LFTs/CMET in 6 weeks. 5. Essential  HTN - controlled on present regimen. 6. RLE edema - the patient states this is new. This may be due to her endoscopic vein harvest. It would be unlikely that she would have recurrent DVT  on blood thinner. However, given recent bilateral PEs, will check d-dimer. If positive (which it still may be from surgery), will proceed with RLE duplex. Otherwise will have her plan to f/u TCTS. 7. Menorrhagia - the patient plans to f/u GYN for this. Will recheck CBC today. 8. Abnormal TSH - remains persistently suppressed. Not clear this is really related to above issues but will refer to endocrinology for definitive eval.  Disposition: F/u with Dr. Radford Pax in January as scheduled. We have also referred her to PCP.   Medication Adjustments/Labs and Tests Ordered: Current medicines are reviewed at length with the patient today.  Concerns regarding medicines are outlined above. Medication changes, Labs and Tests ordered today are summarized above and listed in the Patient Instructions accessible in Encounters.   Raechel Ache PA-C  08/17/2016 10:16 AM    [address], [place], [place]  [address] Phone: [phone]; Fax: [phone]

## 2016-08-15 NOTE — Telephone Encounter (Signed)
Per Dr. Radford Pax, GXT ordered for scheduling. "If patient reaches > 5 mets on ETT will not qualify for Rehab"  Reviewed instructions of GXT with patient. She understands that if [facility] has not called her to scheduler GXT prior to appointment Thursday to schedule at check out. She was grateful for call.

## 2016-08-17 ENCOUNTER — Encounter: Payer: Self-pay | Admitting: Physician Assistant

## 2016-08-17 ENCOUNTER — Ambulatory Visit (INDEPENDENT_AMBULATORY_CARE_PROVIDER_SITE_OTHER): Payer: Medicare Other | Admitting: Physician Assistant

## 2016-08-17 VITALS — BP 122/64 | HR 86 | Ht 64.0 in | Wt 233.8 lb

## 2016-08-17 DIAGNOSIS — I10 Essential (primary) hypertension: Secondary | ICD-10-CM | POA: Diagnosis not present

## 2016-08-17 DIAGNOSIS — R600 Localized edema: Secondary | ICD-10-CM

## 2016-08-17 DIAGNOSIS — N183 Chronic kidney disease, stage 3 unspecified: Secondary | ICD-10-CM

## 2016-08-17 DIAGNOSIS — I2699 Other pulmonary embolism without acute cor pulmonale: Secondary | ICD-10-CM | POA: Diagnosis not present

## 2016-08-17 DIAGNOSIS — E785 Hyperlipidemia, unspecified: Secondary | ICD-10-CM

## 2016-08-17 DIAGNOSIS — N924 Excessive bleeding in the premenopausal period: Secondary | ICD-10-CM

## 2016-08-17 DIAGNOSIS — I2510 Atherosclerotic heart disease of native coronary artery without angina pectoris: Secondary | ICD-10-CM | POA: Diagnosis not present

## 2016-08-17 DIAGNOSIS — R946 Abnormal results of thyroid function studies: Secondary | ICD-10-CM

## 2016-08-17 DIAGNOSIS — I200 Unstable angina: Secondary | ICD-10-CM

## 2016-08-17 DIAGNOSIS — R7989 Other specified abnormal findings of blood chemistry: Secondary | ICD-10-CM

## 2016-08-17 LAB — CBC
HEMATOCRIT: 31.3 % — AB (ref 35.0–45.0)
HEMOGLOBIN: 9.9 g/dL — AB (ref 11.7–15.5)
MCH: 27.9 pg (ref 27.0–33.0)
MCHC: 31.6 g/dL — AB (ref 32.0–36.0)
MCV: 88.2 fL (ref 80.0–100.0)
MPV: 12.1 fL (ref 7.5–12.5)
Platelets: 413 10*3/uL — ABNORMAL HIGH (ref 140–400)
RBC: 3.55 MIL/uL — AB (ref 3.80–5.10)
RDW: 15.6 % — ABNORMAL HIGH (ref 11.0–15.0)
WBC: 8.3 10*3/uL (ref 3.8–10.8)

## 2016-08-17 LAB — COMPREHENSIVE METABOLIC PANEL
ALBUMIN: 3 g/dL — AB (ref 3.6–5.1)
ALK PHOS: 127 U/L (ref 33–130)
ALT: 8 U/L (ref 6–29)
AST: 9 U/L — ABNORMAL LOW (ref 10–35)
BUN: 21 mg/dL (ref 7–25)
CALCIUM: 8.6 mg/dL (ref 8.6–10.4)
CHLORIDE: 101 mmol/L (ref 98–110)
CO2: 24 mmol/L (ref 20–31)
Creat: 1.68 mg/dL — ABNORMAL HIGH (ref 0.50–1.05)
Glucose, Bld: 155 mg/dL — ABNORMAL HIGH (ref 65–99)
POTASSIUM: 4.7 mmol/L (ref 3.5–5.3)
Sodium: 135 mmol/L (ref 135–146)
TOTAL PROTEIN: 7.1 g/dL (ref 6.1–8.1)
Total Bilirubin: 0.3 mg/dL (ref 0.2–1.2)

## 2016-08-17 MED ORDER — ATORVASTATIN CALCIUM 80 MG PO TABS: 80.0000 mg | ORAL_TABLET | Freq: Every evening | ORAL | 3 refills | Status: DC

## 2016-08-17 NOTE — Patient Instructions (Addendum)
Medication Instructions:  Your physician has recommended you make the following change in your medication:  1.  STOP the Vytorin 2.  STOP the Amiodarone 3.  START the Atorvastatin 80 mg taking 1 tablet every evening   Labwork: TODAY:  CMET, CBC, & DDIMER 6 WEEKS:  FASTING LIPIDS & CMET  Testing/Procedures: None ordered  Follow-Up: KEEP YOUR SCHEDULED APPOINTMENT WITH DR. Radford Pax 10/04/16  Any Other Special Instructions Will Be Listed Below (If Applicable).  You have been referred to ENDOCRINOLOGY FOR YOUR ABNORMAL THYROID FUNCTION.      If you need a refill on your cardiac medications before your next appointment, please call your pharmacy.

## 2016-08-17 NOTE — Telephone Encounter (Signed)
GXT has been scheduled 12/12.

## 2016-08-18 ENCOUNTER — Telehealth: Payer: Self-pay | Admitting: *Deleted

## 2016-08-18 ENCOUNTER — Ambulatory Visit (HOSPITAL_COMMUNITY)
Admission: RE | Admit: 2016-08-18 | Discharge: 2016-08-18 | Disposition: A | Discharge status: Home / Self Care | Payer: Medicare Other | Source: Ambulatory Visit | Attending: Cardiology | Admitting: Cardiology

## 2016-08-18 DIAGNOSIS — R7989 Other specified abnormal findings of blood chemistry: Secondary | ICD-10-CM | POA: Diagnosis not present

## 2016-08-18 DIAGNOSIS — M7989 Other specified soft tissue disorders: Secondary | ICD-10-CM | POA: Diagnosis not present

## 2016-08-18 LAB — D-DIMER, QUANTITATIVE: D-Dimer, Quant: 4.69 mcg/mL FEU — ABNORMAL HIGH (ref <0.50)

## 2016-08-18 NOTE — Telephone Encounter (Signed)
-----   Message from Charlie Pitter, [place] sent at 08/18/2016  7:50 AM EST ----- Please let patient know d-dimer is elevated. As we discussed this may be due to recent surgery but given her history of blood clotting I would recommend a RLE venous duplex TODAY stat to evaluate for DVT. If this is negative her swelling may just be related to recent surgery. Otherwise hemoglobin is stable.   Her creatinine is somewhat elevated but looks relatively stable compared to where she has settled out post-operatively. I suspect she has a degree of chronic kidney disease from her diabetes as this was present back in September as well. Would recommend she generally stay away from medicines like ibuprofen, Advil, Motrin, naproxen, and Aleve due to risk of worsening kidney function. She can stop potassium supplement. Dayna Dunn PA-C

## 2016-08-18 NOTE — Telephone Encounter (Signed)
Called pt to make her aware that her DDIMER was elevated and we needed to get a LE Venous Duplex ASAP.  Pt is scheduled for 10:00 today at our [place] office.  Pt and daughter have been made aware.

## 2016-08-21 DIAGNOSIS — S01502D Unspecified open wound of oral cavity, subsequent encounter: Secondary | ICD-10-CM | POA: Diagnosis not present

## 2016-08-21 DIAGNOSIS — H2513 Age-related nuclear cataract, bilateral: Secondary | ICD-10-CM | POA: Diagnosis not present

## 2016-08-21 DIAGNOSIS — Z9181 History of falling: Secondary | ICD-10-CM | POA: Diagnosis not present

## 2016-08-21 DIAGNOSIS — S025XXD Fracture of tooth (traumatic), subsequent encounter for fracture with routine healing: Secondary | ICD-10-CM | POA: Diagnosis not present

## 2016-08-21 DIAGNOSIS — E119 Type 2 diabetes mellitus without complications: Secondary | ICD-10-CM | POA: Diagnosis not present

## 2016-08-22 ENCOUNTER — Ambulatory Visit (INDEPENDENT_AMBULATORY_CARE_PROVIDER_SITE_OTHER): Payer: Medicare Other | Admitting: Family Medicine

## 2016-08-22 ENCOUNTER — Encounter: Payer: Self-pay | Admitting: Family Medicine

## 2016-08-22 ENCOUNTER — Ambulatory Visit: Payer: Medicare Other

## 2016-08-22 DIAGNOSIS — Z0000 Encounter for general adult medical examination without abnormal findings: Secondary | ICD-10-CM

## 2016-08-22 DIAGNOSIS — I200 Unstable angina: Secondary | ICD-10-CM

## 2016-08-22 MED ORDER — ONETOUCH ULTRA SYSTEM W/DEVICE KIT: 1.0000 | PACK | Freq: Once | 0 refills | Status: AC

## 2016-08-22 MED ORDER — THERA VITAL M PO TABS: 1.0000 | ORAL_TABLET | Freq: Every day | ORAL | 5 refills | Status: DC

## 2016-08-22 MED ORDER — FERROUS SULFATE 325 (65 FE) MG PO TABS: 325.0000 mg | ORAL_TABLET | Freq: Every day | ORAL | 3 refills | Status: DC

## 2016-08-22 NOTE — Patient Instructions (Signed)
Thank you for coming in today, it was so nice to see you! Today we talked about:    Establishing care  I have sent over vitamins, iron, and a diabetes supply kit to your pharmacy  Please follow up in 1 week. You can schedule this appointment at the front desk before you leave or call the clinic.  Bring in all your medications or supplements to each appointment for review.   If you have any questions or concerns, please do not hesitate to call the office at [phone]. You can also message me directly via MyChart.   Sincerely,  Smitty Cords, MD

## 2016-08-22 NOTE — Assessment & Plan Note (Addendum)
Obtained and updated patient's past medical history, surgical history, family medical history, social history and medications.  -Follow-up in one week to discuss chronic conditions including diabetes

## 2016-08-22 NOTE — Progress Notes (Signed)
HPI:  Tamara Henry is a 55 y.o. year old female who presents today for a new patient appointment to establish general primary care.  ROS: See HPI Review of Systems  Constitutional: Negative for chills and fever.  HENT: Positive for nosebleeds. Negative for congestion and hearing loss.   Eyes: Negative for blurred vision and pain.  Respiratory: Positive for cough (dry). Negative for shortness of breath and wheezing.   Cardiovascular: Positive for leg swelling (recently had a vein grafted from that leg). Negative for chest pain and palpitations.  Gastrointestinal: Positive for heartburn. Negative for abdominal pain, blood in stool, constipation, diarrhea, nausea and vomiting.  Genitourinary: Negative for dysuria and urgency.  Musculoskeletal: Positive for back pain and joint pain (knees).  Skin: Negative for rash.  Neurological: Positive for tingling (hands and left leg). Negative for dizziness and headaches.  Endo/Heme/Allergies: Negative for polydipsia.  Psychiatric/Behavioral: Positive for depression. Negative for substance abuse and suicidal ideas. The patient is nervous/anxious. The patient does not have insomnia.     Past Medical Hx:  Past Medical History:  Diagnosis Date  . Acid reflux   . Back pain   . Bilateral pulmonary embolism ([place]) 05/2016  . CAD in native artery    a. s/p CABGx4 07/28/16.  . Cataracts, bilateral   . CKD (chronic kidney disease), stage III    borderline CKD II-III  . Depression   . Diabetes mellitus ([place])   . GERD (gastroesophageal reflux disease)   . Glaucoma   . High cholesterol   . Hx of chest tube placement right   . Hypertension   . MI (myocardial infarction) 2017  . Morbid obesity ([place])   . Neuropathy ([facility])    hands and feet  . Pneumonia   . Postoperative anemia 07/2016  . Stroke [facility]) 2005    Past Surgical Hx:  Past Surgical History:  Procedure Laterality Date  . CARDIAC CATHETERIZATION N/A 07/25/2016   Procedure: Left Heart  Cath and Coronary Angiography;  Surgeon: Peter M Martinique, MD;  Location: [facility];  Service: Cardiovascular;  Laterality: N/A;  . CORONARY ARTERY BYPASS GRAFT N/A 07/28/2016   Procedure: CORONARY ARTERY BYPASS GRAFTING (CABG)x4 using left internal mammary and endoscopic harvest of right greater saphenous vein;  Surgeon: Ivin Poot, MD;  Location: [facility];  Service: Open Heart Surgery;  Laterality: N/A;  . TEE WITHOUT CARDIOVERSION N/A 07/28/2016   Procedure: TRANSESOPHAGEAL ECHOCARDIOGRAM (TEE);  Surgeon: Ivin Poot, MD;  Location: [facility];  Service: Open Heart Surgery;  Laterality: N/A;  . TUBAL LIGATION      Family Hx: updated in Epic  Social Hx:  Social History   Social History  . Marital status: Married    Spouse name: N/A  . Number of children: 4  . Years of education: 12   Occupational History  . disabled    Social History Main Topics  . Smoking status: Former Smoker    Quit date: 09/11/2005  . Smokeless tobacco: Never Used  . Alcohol use No  . Drug use: No  . Sexual activity: Yes    Birth control/ protection: Surgical   Other Topics Concern  . Not on file   Social History Narrative   Lives at home with daughter Claris Gladden no caffeine     Health Maintenance:  Health Maintenance Due  Topic Date Due  . Hepatitis C Screening  [birth year]  . FOOT EXAM  07/07/1973  . OPHTHALMOLOGY EXAM  07/07/1973  .  URINE MICROALBUMIN  07/07/1973  . HIV Screening  07/07/1978  . TETANUS/TDAP  07/07/1982  . PAP SMEAR  07/07/1984  . COLONOSCOPY  07/07/2013  . INFLUENZA VACCINE  04/11/2016    PHYSICAL EXAM: BP 100/62 (BP Location: Left Arm, Patient Position: Sitting, Cuff Size: Large)   Pulse 64   Temp 98.5 F (36.9 C) (Oral)   Ht 5\' 4"  (1.626 m)   Wt 231 lb 12.8 oz (105.1 kg)   LMP 08/18/2016 (Exact Date)   SpO2 96%   BMI 39.79 kg/m  Gen: Pleasant female sitting up right, in no acute distress, obese HEENT: Normocephalic atraumatic, tympanic membranes  normal bilaterally, no nasal discharge, moist mucous membranes, poor dentition Heart: Regular rate and rhythm, 1+ pitting edema in right ankle to mid shin, trace edema in left ankle Lungs: Normal work of breathing, clear to auscultation bilaterally Abdomen: Soft, nondistended, nontender, no masses palpated, normal bowel sounds Neuro: Alert and oriented 3, no focal deficits, normal gait Skin: Well-healed incision on sternum, healing incision on right medial mid calf and left medial mid calf  ASSESSMENT/PLAN:  Health maintenance:  -Patient states that she already had her flu shot -We will follow up in future visits discuss her other health maintenance including the need for a colonoscopy and Pap smear  Encounter for medical examination to establish care Obtained and updated patient's past medical history, surgical history, family medical history, social history and medications.  -Follow-up in one week to discuss chronic conditions including diabetes    FOLLOW UP: Follow up in one week  Smitty Cords, MD PGY-2 [place]

## 2016-08-23 DIAGNOSIS — Z9181 History of falling: Secondary | ICD-10-CM | POA: Diagnosis not present

## 2016-08-23 DIAGNOSIS — H2513 Age-related nuclear cataract, bilateral: Secondary | ICD-10-CM | POA: Diagnosis not present

## 2016-08-23 DIAGNOSIS — S025XXD Fracture of tooth (traumatic), subsequent encounter for fracture with routine healing: Secondary | ICD-10-CM | POA: Diagnosis not present

## 2016-08-23 DIAGNOSIS — S01502D Unspecified open wound of oral cavity, subsequent encounter: Secondary | ICD-10-CM | POA: Diagnosis not present

## 2016-08-23 DIAGNOSIS — E119 Type 2 diabetes mellitus without complications: Secondary | ICD-10-CM | POA: Diagnosis not present

## 2016-08-24 DIAGNOSIS — S025XXD Fracture of tooth (traumatic), subsequent encounter for fracture with routine healing: Secondary | ICD-10-CM | POA: Diagnosis not present

## 2016-08-24 DIAGNOSIS — H2513 Age-related nuclear cataract, bilateral: Secondary | ICD-10-CM | POA: Diagnosis not present

## 2016-08-24 DIAGNOSIS — Z9181 History of falling: Secondary | ICD-10-CM | POA: Diagnosis not present

## 2016-08-24 DIAGNOSIS — S01502D Unspecified open wound of oral cavity, subsequent encounter: Secondary | ICD-10-CM | POA: Diagnosis not present

## 2016-08-24 DIAGNOSIS — E119 Type 2 diabetes mellitus without complications: Secondary | ICD-10-CM | POA: Diagnosis not present

## 2016-08-28 DIAGNOSIS — S025XXD Fracture of tooth (traumatic), subsequent encounter for fracture with routine healing: Secondary | ICD-10-CM | POA: Diagnosis not present

## 2016-08-28 DIAGNOSIS — H2513 Age-related nuclear cataract, bilateral: Secondary | ICD-10-CM | POA: Diagnosis not present

## 2016-08-28 DIAGNOSIS — S01502D Unspecified open wound of oral cavity, subsequent encounter: Secondary | ICD-10-CM | POA: Diagnosis not present

## 2016-08-28 DIAGNOSIS — Z9181 History of falling: Secondary | ICD-10-CM | POA: Diagnosis not present

## 2016-08-28 DIAGNOSIS — E119 Type 2 diabetes mellitus without complications: Secondary | ICD-10-CM | POA: Diagnosis not present

## 2016-08-29 ENCOUNTER — Telehealth: Payer: Self-pay | Admitting: *Deleted

## 2016-08-29 ENCOUNTER — Ambulatory Visit: Payer: Self-pay | Admitting: Family Medicine

## 2016-08-29 DIAGNOSIS — F4321 Adjustment disorder with depressed mood: Secondary | ICD-10-CM | POA: Diagnosis not present

## 2016-08-29 DIAGNOSIS — Z9181 History of falling: Secondary | ICD-10-CM | POA: Diagnosis not present

## 2016-08-29 DIAGNOSIS — S01502D Unspecified open wound of oral cavity, subsequent encounter: Secondary | ICD-10-CM | POA: Diagnosis not present

## 2016-08-29 DIAGNOSIS — E119 Type 2 diabetes mellitus without complications: Secondary | ICD-10-CM | POA: Diagnosis not present

## 2016-08-29 DIAGNOSIS — S025XXD Fracture of tooth (traumatic), subsequent encounter for fracture with routine healing: Secondary | ICD-10-CM | POA: Diagnosis not present

## 2016-08-29 DIAGNOSIS — H2513 Age-related nuclear cataract, bilateral: Secondary | ICD-10-CM | POA: Diagnosis not present

## 2016-08-29 NOTE — Telephone Encounter (Signed)
Pramod, Physical Therapist with [facility] called to request verbal order for medical social worker.  Verbal order given by Dr. Juanito Doom.  Derl Barrow, RN

## 2016-08-30 ENCOUNTER — Encounter: Payer: Self-pay | Admitting: Family Medicine

## 2016-08-30 ENCOUNTER — Ambulatory Visit (INDEPENDENT_AMBULATORY_CARE_PROVIDER_SITE_OTHER): Payer: Medicare Other | Admitting: Family Medicine

## 2016-08-30 VITALS — BP 116/68 | HR 69 | Temp 98.6°F | Ht 64.0 in | Wt 233.2 lb

## 2016-08-30 DIAGNOSIS — E118 Type 2 diabetes mellitus with unspecified complications: Secondary | ICD-10-CM | POA: Diagnosis present

## 2016-08-30 DIAGNOSIS — IMO0002 Reserved for concepts with insufficient information to code with codable children: Secondary | ICD-10-CM

## 2016-08-30 DIAGNOSIS — I2699 Other pulmonary embolism without acute cor pulmonale: Secondary | ICD-10-CM

## 2016-08-30 DIAGNOSIS — Z794 Long term (current) use of insulin: Secondary | ICD-10-CM

## 2016-08-30 DIAGNOSIS — G8929 Other chronic pain: Secondary | ICD-10-CM

## 2016-08-30 DIAGNOSIS — D649 Anemia, unspecified: Secondary | ICD-10-CM

## 2016-08-30 DIAGNOSIS — M545 Low back pain: Secondary | ICD-10-CM | POA: Diagnosis not present

## 2016-08-30 DIAGNOSIS — E1165 Type 2 diabetes mellitus with hyperglycemia: Secondary | ICD-10-CM

## 2016-08-30 DIAGNOSIS — M549 Dorsalgia, unspecified: Secondary | ICD-10-CM | POA: Insufficient documentation

## 2016-08-30 DIAGNOSIS — I200 Unstable angina: Secondary | ICD-10-CM

## 2016-08-30 DIAGNOSIS — E1121 Type 2 diabetes mellitus with diabetic nephropathy: Secondary | ICD-10-CM

## 2016-08-30 DIAGNOSIS — M79606 Pain in leg, unspecified: Secondary | ICD-10-CM | POA: Diagnosis not present

## 2016-08-30 MED ORDER — METHOCARBAMOL 750 MG PO TABS: 1500.0000 mg | ORAL_TABLET | Freq: Two times a day (BID) | ORAL | 0 refills | Status: DC | PRN

## 2016-08-30 MED ORDER — INSULIN GLARGINE 100 UNIT/ML ~~LOC~~ SOLN: 35.0000 [IU] | Freq: Every day | SUBCUTANEOUS | 11 refills | Status: DC

## 2016-08-30 MED ORDER — INSULIN GLARGINE 100 UNIT/ML ~~LOC~~ SOLN: 20.0000 [IU] | Freq: Two times a day (BID) | SUBCUTANEOUS | 11 refills | Status: DC

## 2016-08-30 NOTE — Progress Notes (Signed)
Subjective:    Patient ID: Tamara Henry , female   DOB: May 28, 1961 , 55 y.o..   MRN: [id number]  HPI  [name] is here for  Chief Complaint  Patient presents with  . Diabetes   Chronic Diabetes Disease Monitoring  Blood Sugar Ranges: checks glucose at home, average is in the low 200's.   Polyuria: no   Visual problems: no   Last hemoglobin A1C:  Lab Results  Component Value Date   HGBA1C 9.2 (H) 07/26/2016   Medication Compliance: yes. Takes Lantus 15 units twice a day and NovoLog 5 units twice a day. She has been on insulin for 2 years. Her daughter is a big help to her at home.  Medication Side Effects  Hypoglycemia: no   Preventitive Health Care  Eye Exam: Overdue. States she has diabetic retinopathy, was diagnosed 4 years ago. She was getting injection in her eyes every 30 days  Foot Exam: Overdue   Diet pattern: Good compliance with low sugar diet  Exercise: Has home health physical therapy   Anemia: Her last hemoglobin was 9.9. States that while she was in the hospital for her surgery last month she had vaginal bleeding entire time but it stopped on December 8. Admits to some dark stools but thinks it may be because of iron pills. Denies any pica symptoms, syncope, fatigue, or headache.  Anticoagulation: Patient was started on Xarelto September 9 for submassive bilateral pulmonary embolism with no clear cause or etiology. No shortness of breath, still coughing a little bit with clear fluid.   Post op pain: Patient states she is still having pain in her legs from where they grafted her veins last month after her CABG procedure. Additionally some pain on her chest. She has been taking extra strength Tylenol without relief.  Review of Systems: Per HPI. All other systems reviewed and are negative.  Social Hx:  reports that she quit smoking about 10 years ago. She has never used smokeless tobacco.   Objective:   BP 116/68   Pulse 69   Temp 98.6 F (37 C) (Oral)    Ht 5\' 4"  (1.626 m)   Wt 233 lb 3.2 oz (105.8 kg)   LMP 08/18/2016 (Exact Date)   SpO2 96%   BMI 40.03 kg/m  Physical Exam  Gen: NAD, alert, cooperative with exam, well-appearing HEENT: NCAT, PERRL, clear conjunctiva, oropharynx clear, supple neck, missing some teeth Cardiac: Regular rate and rhythm, normal S1/S2, no murmur, trace edema in bilateral ankles, capillary refill brisk  Respiratory: Clear to auscultation bilaterally, no wheezes, non-labored breathing Gastrointestinal: soft, non tender, non distended, bowel sounds present Skin: no rashes, normal turgor  Neurological: no gross deficits.  Psych: good insight, normal mood and affect Diabetic Foot Exam - Simple   Simple Foot Form Diabetic Foot exam was performed with the following findings:  Yes 08/30/2016  6:12 PM  Visual Inspection No deformities, no ulcerations, no other skin breakdown bilaterally:  Yes Sensation Testing Intact to touch and monofilament testing bilaterally:  Yes Pulse Check Posterior Tibialis and Dorsalis pulse intact bilaterally:  Yes Comments      Assessment & Plan:  Anemia Normocytic anemia. Hemoglobin 9.9 most recently. Hemoglobin was normal prior to November when she had her CABG procedure. Suspecting that this may be just blood loss anemia versus early iron deficiency edema.  -Anemia panel ordered -We'll await records from her previous PCP to ensure she has had a colonoscopy (patient states that she had a colonoscopy last  year and it was normal) -Continue iron supplementation -Recheck hemoglobin in one month   Bilateral pulmonary embolism (HCC) On Xarelto 20 mg daily. This was started in September 2017 after she was admitted to the hospital for submassive bilateral pulmonary emboli. She will need to be on this medication for a total of 6 months since start date. -Continue Xarelto 20 mg daily, will complete course in mid March 2018  DM type 2, uncontrolled, with renal complications  (HCC) Uncontrolled. Hemoglobin A1c was 9.2 in last month. Current regimen is Lantus 15 units twice a day and NovoLog 5 units twice a day. She has not tolerated metformin in the past due to GI upset. Diabetic foot exam performed today and was normal.  -Change Lantus to an easier dosing of 35 units daily at bedtime -Continue NovoLog 5 units twice a day -Encouraged patient to check her glucose at least twice daily and keep a log -Check hemoglobin A1cin 3 months -Referral for ophthalmology made for diabetic eye exam -Patient prefers to follow up in one month after making changes to her insulin regimen   Leg pain Continues to have pain in her leg where pain was grafted from CABG procedure. Tylenol Extra Strength has not been helping. She has tried Robaxin before as she usually takes this for her back and would like to try this again. Although this is not the first line treatment for postop pain advised that she can try the Robaxin and see if this helped. Would avoid NSAIDs at this time given cardiac history.    Smitty Cords, MD [place], PGY-2

## 2016-08-30 NOTE — Assessment & Plan Note (Addendum)
Uncontrolled. Hemoglobin A1c was 9.2 in last month. Current regimen is Lantus 15 units twice a day and NovoLog 5 units twice a day. She has not tolerated metformin in the past due to GI upset. Diabetic foot exam performed today and was normal.  -Change Lantus to an easier dosing of 35 units daily at bedtime -Continue NovoLog 5 units twice a day -Encouraged patient to check her glucose at least twice daily and keep a log -Check hemoglobin A1cin 3 months -Referral for ophthalmology made for diabetic eye exam -Patient prefers to follow up in one month after making changes to her insulin regimen

## 2016-08-30 NOTE — Assessment & Plan Note (Signed)
On Xarelto 20 mg daily. This was started in September 2017 after she was admitted to the hospital for submassive bilateral pulmonary emboli. She will need to be on this medication for a total of 6 months since start date. -Continue Xarelto 20 mg daily, will complete course in mid March 2018

## 2016-08-30 NOTE — Assessment & Plan Note (Addendum)
Normocytic anemia. Hemoglobin 9.9 most recently. Hemoglobin was normal prior to November when she had her CABG procedure. Suspecting that this may be just blood loss anemia versus early iron deficiency edema.  -Anemia panel ordered -We'll await records from her previous PCP to ensure she has had a colonoscopy (patient states that she had a colonoscopy last year and it was normal) -Continue iron supplementation -Recheck hemoglobin in one month

## 2016-08-30 NOTE — Assessment & Plan Note (Signed)
Continues to have pain in her leg where pain was grafted from CABG procedure. Tylenol Extra Strength has not been helping. She has tried Robaxin before as she usually takes this for her back and would like to try this again. Although this is not the first line treatment for postop pain advised that she can try the Robaxin and see if this helped. Would avoid NSAIDs at this time given cardiac history.

## 2016-08-30 NOTE — Patient Instructions (Signed)
Thank you for coming in today, it was so nice to see you! Today we talked about:    Diabetes: We have changed the long-acting Lantus insulin to 35 units once daily at night. Please continue taking the short acting NovoLog insulin as prescribed. Follow-up in 3 months to recheck your hemoglobin A1c. Continue to check your sugar at least twice daily. If her sugars are below 70, only take 30 units of Lantus daily.  Anemia: We have ordered an anemia panel for blood work today, I will follow up with the results on this with you  Blood thinners: Continue taking Xarelto as prescribed  Eyes: I placed referral for an eye doctor, so will call you schedules appointment   leg and chest pain: Continue taking extra strength Tylenol as needed, take a Robaxin pill if needed  Please follow up in  One month You can schedule this appointment at the front desk before you leave or call the clinic.  Bring in all your medications or supplements to each appointment for review.   If we ordered any tests today, you will be notified via telephone of any abnormalities. If everything is normal you will get a letter in the mail.   If you have any questions or concerns, please do not hesitate to call the office at [phone]. You can also message me directly via MyChart.   Sincerely,  Smitty Cords, MD

## 2016-08-31 ENCOUNTER — Encounter: Payer: Self-pay | Admitting: Endocrinology

## 2016-08-31 LAB — ANEMIA PANEL
%SAT: 9 % — ABNORMAL LOW (ref 11–50)
ABS Retic: 123380 cells/uL — ABNORMAL HIGH (ref 20000–80000)
FERRITIN: 45 ng/mL (ref 10–232)
FOLATE: 5.8 ng/mL (ref >5.4)
Iron: 30 ug/dL — ABNORMAL LOW (ref 45–160)
RBC.: 3.98 MIL/uL (ref 3.80–5.10)
RETIC CT PCT: 3.1 %
TIBC: 338 ug/dL (ref 250–450)
UIBC: 308 ug/dL (ref 125–400)
VITAMIN B 12: 582 pg/mL (ref 200–1100)

## 2016-09-01 DIAGNOSIS — Z9181 History of falling: Secondary | ICD-10-CM | POA: Diagnosis not present

## 2016-09-01 DIAGNOSIS — S025XXD Fracture of tooth (traumatic), subsequent encounter for fracture with routine healing: Secondary | ICD-10-CM | POA: Diagnosis not present

## 2016-09-01 DIAGNOSIS — E119 Type 2 diabetes mellitus without complications: Secondary | ICD-10-CM | POA: Diagnosis not present

## 2016-09-01 DIAGNOSIS — S01502D Unspecified open wound of oral cavity, subsequent encounter: Secondary | ICD-10-CM | POA: Diagnosis not present

## 2016-09-01 DIAGNOSIS — H2513 Age-related nuclear cataract, bilateral: Secondary | ICD-10-CM | POA: Diagnosis not present

## 2016-09-05 DIAGNOSIS — K219 Gastro-esophageal reflux disease without esophagitis: Secondary | ICD-10-CM | POA: Diagnosis not present

## 2016-09-05 DIAGNOSIS — E785 Hyperlipidemia, unspecified: Secondary | ICD-10-CM | POA: Diagnosis not present

## 2016-09-05 DIAGNOSIS — E1142 Type 2 diabetes mellitus with diabetic polyneuropathy: Secondary | ICD-10-CM | POA: Diagnosis not present

## 2016-09-05 DIAGNOSIS — I2782 Chronic pulmonary embolism: Secondary | ICD-10-CM | POA: Diagnosis not present

## 2016-09-05 DIAGNOSIS — I129 Hypertensive chronic kidney disease with stage 1 through stage 4 chronic kidney disease, or unspecified chronic kidney disease: Secondary | ICD-10-CM | POA: Diagnosis not present

## 2016-09-05 DIAGNOSIS — Z7982 Long term (current) use of aspirin: Secondary | ICD-10-CM | POA: Diagnosis not present

## 2016-09-05 DIAGNOSIS — I2510 Atherosclerotic heart disease of native coronary artery without angina pectoris: Secondary | ICD-10-CM | POA: Diagnosis not present

## 2016-09-05 DIAGNOSIS — E1122 Type 2 diabetes mellitus with diabetic chronic kidney disease: Secondary | ICD-10-CM | POA: Diagnosis not present

## 2016-09-05 DIAGNOSIS — F329 Major depressive disorder, single episode, unspecified: Secondary | ICD-10-CM | POA: Diagnosis not present

## 2016-09-05 DIAGNOSIS — H2513 Age-related nuclear cataract, bilateral: Secondary | ICD-10-CM | POA: Diagnosis not present

## 2016-09-05 DIAGNOSIS — Z7901 Long term (current) use of anticoagulants: Secondary | ICD-10-CM | POA: Diagnosis not present

## 2016-09-05 DIAGNOSIS — N183 Chronic kidney disease, stage 3 (moderate): Secondary | ICD-10-CM | POA: Diagnosis not present

## 2016-09-05 DIAGNOSIS — Z794 Long term (current) use of insulin: Secondary | ICD-10-CM | POA: Diagnosis not present

## 2016-09-05 DIAGNOSIS — R339 Retention of urine, unspecified: Secondary | ICD-10-CM | POA: Diagnosis not present

## 2016-09-05 DIAGNOSIS — Z48812 Encounter for surgical aftercare following surgery on the circulatory system: Secondary | ICD-10-CM | POA: Diagnosis not present

## 2016-09-06 ENCOUNTER — Ambulatory Visit (INDEPENDENT_AMBULATORY_CARE_PROVIDER_SITE_OTHER): Payer: Medicare Other | Admitting: Family Medicine

## 2016-09-06 VITALS — BP 138/70 | HR 74 | Temp 98.5°F | Wt 232.2 lb

## 2016-09-06 DIAGNOSIS — I200 Unstable angina: Secondary | ICD-10-CM

## 2016-09-06 DIAGNOSIS — R0981 Nasal congestion: Secondary | ICD-10-CM

## 2016-09-06 MED ORDER — GUAIFENESIN 100 MG/5ML PO SOLN: 5.0000 mL | ORAL | 0 refills | Status: DC | PRN

## 2016-09-06 MED ORDER — BENZONATATE 100 MG PO CAPS: 100.0000 mg | ORAL_CAPSULE | Freq: Three times a day (TID) | ORAL | 0 refills | Status: DC | PRN

## 2016-09-06 MED ORDER — ACETAMINOPHEN-CODEINE #3 300-30 MG PO TABS: 1.0000 | ORAL_TABLET | Freq: Four times a day (QID) | ORAL | 0 refills | Status: DC | PRN

## 2016-09-06 NOTE — Assessment & Plan Note (Addendum)
  Likely viral URI, anticipate this will clear up in next week but advised patient cough may linger for 2-3 weeks. Recommended plenty of clear fluids, rest Rx given for Tessalon perles and robitussin for cough Rx given for Tylenol 3 q6horus as needed for pain Return precautions given for development of fever or worsening symptoms

## 2016-09-06 NOTE — Patient Instructions (Signed)
Upper Respiratory Infection, Adult Most upper respiratory infections (URIs) are caused by a virus. A URI affects the nose, throat, and upper air passages. The most common type of URI is often called "the common cold." Follow these instructions at home:  Take medicines only as told by your doctor.  Gargle warm saltwater or take cough drops to comfort your throat as told by your doctor.  Use a warm mist humidifier or inhale steam from a shower to increase air moisture. This may make it easier to breathe.  Drink enough fluid to keep your pee (urine) clear or pale yellow.  Eat soups and other clear broths.  Have a healthy diet.  Rest as needed.  Go back to work when your fever is gone or your doctor says it is okay.  You may need to stay home longer to avoid giving your URI to others.  You can also wear a face mask and wash your hands often to prevent spread of the virus.  Use your inhaler more if you have asthma.  Do not use any tobacco products, including cigarettes, chewing tobacco, or electronic cigarettes. If you need help quitting, ask your doctor. Contact a doctor if:  You are getting worse, not better.  Your symptoms are not helped by medicine.  You have chills.  You are getting more short of breath.  You have brown or red mucus.  You have yellow or brown discharge from your nose.  You have pain in your face, especially when you bend forward.  You have a fever.  You have puffy (swollen) neck glands.  You have pain while swallowing.  You have white areas in the back of your throat. Get help right away if:  You have very bad or constant:  Headache.  Ear pain.  Pain in your forehead, behind your eyes, and over your cheekbones (sinus pain).  Chest pain.  You have long-lasting (chronic) lung disease and any of the following:  Wheezing.  Long-lasting cough.  Coughing up blood.  A change in your usual mucus.  You have a stiff neck.  You have  changes in your:  Vision.  Hearing.  Thinking.  Mood. This information is not intended to replace advice given to you by your health care provider. Make sure you discuss any questions you have with your health care provider. Document Released: 02/14/2008 Document Revised: 04/30/2016 Document Reviewed: 12/03/2013 Elsevier Interactive Patient Education  2017 Elsevier Inc.  

## 2016-09-06 NOTE — Progress Notes (Signed)
    Subjective:    Patient ID: Tamara Henry, female    DOB: 06-22-1961, 55 y.o.   MRN: [id number]   CC: congestion  HPI: has had nasal congestion, runny nose, and cough for past 3 days. Cough is intense and causing her a great deal of pain due to recent open heart surgery. She has only been taking cough drops because she was afraid to take anything that might hurt her heart or worsen diabetes. She denies fevers, chills, sputum production, sore throat. No nausea, vomiting, decreased appetite, diarrhea, constipation. She was visiting by a friend who had bronchitis last week.    Smoking status reviewed- non smoker Review of Systems- see HPI   Objective:  BP 138/70   Pulse 74   Temp 98.5 F (36.9 C) (Oral)   Wt 232 lb 3.2 oz (105.3 kg)   LMP 08/18/2016 (Exact Date)   SpO2 98%   BMI 39.86 kg/m  Vitals and nursing note reviewed  General: well nourished, in no acute distress HEENT: normocephalic, no scleral icterus or conjunctival pallor, no nasal discharge. Boggy nasal turbinates bilaterally. Moist mucous membranes. Poor dentition. No erythema or discharge noted in posterior oropharynx Neck: supple, non-tender, without lymphadenopathy Cardiac: RRR, clear S1 and S2, no murmurs, rubs, or gallops  Respiratory: clear to auscultation bilaterally, no increased work of breathing Abdomen: soft, nontender, nondistended, +BS Extremities: no edema or cyanosis. Neuro: alert and oriented, no focal deficits   Assessment & Plan:    Nasal congestion  Likely viral URI, anticipate this will clear up in next week but advised patient cough may linger for 2-3 weeks. Recommended plenty of clear fluids, rest Rx given for Tessalon perles and robitussin for cough Rx given for Tylenol 3 q6horus as needed for pain Return precautions given for development of fever or worsening symptoms  Return if symptoms worsen or fail to improve.   Lucila Maine, DO Family Medicine Resident PGY-1

## 2016-09-12 ENCOUNTER — Other Ambulatory Visit: Payer: Self-pay | Admitting: Cardiothoracic Surgery

## 2016-09-12 DIAGNOSIS — E1142 Type 2 diabetes mellitus with diabetic polyneuropathy: Secondary | ICD-10-CM | POA: Diagnosis not present

## 2016-09-12 DIAGNOSIS — I2782 Chronic pulmonary embolism: Secondary | ICD-10-CM | POA: Diagnosis not present

## 2016-09-12 DIAGNOSIS — I2510 Atherosclerotic heart disease of native coronary artery without angina pectoris: Secondary | ICD-10-CM | POA: Diagnosis not present

## 2016-09-12 DIAGNOSIS — I129 Hypertensive chronic kidney disease with stage 1 through stage 4 chronic kidney disease, or unspecified chronic kidney disease: Secondary | ICD-10-CM | POA: Diagnosis not present

## 2016-09-12 DIAGNOSIS — Z951 Presence of aortocoronary bypass graft: Secondary | ICD-10-CM

## 2016-09-12 DIAGNOSIS — E1122 Type 2 diabetes mellitus with diabetic chronic kidney disease: Secondary | ICD-10-CM | POA: Diagnosis not present

## 2016-09-12 DIAGNOSIS — Z48812 Encounter for surgical aftercare following surgery on the circulatory system: Secondary | ICD-10-CM | POA: Diagnosis not present

## 2016-09-13 ENCOUNTER — Ambulatory Visit
Admission: RE | Admit: 2016-09-13 | Discharge: 2016-09-13 | Disposition: A | Discharge status: Home / Self Care | Payer: Medicare Other | Source: Ambulatory Visit | Attending: Cardiothoracic Surgery | Admitting: Cardiothoracic Surgery

## 2016-09-13 ENCOUNTER — Ambulatory Visit (INDEPENDENT_AMBULATORY_CARE_PROVIDER_SITE_OTHER): Payer: Self-pay | Admitting: Cardiothoracic Surgery

## 2016-09-13 ENCOUNTER — Encounter: Payer: Self-pay | Admitting: Cardiothoracic Surgery

## 2016-09-13 ENCOUNTER — Other Ambulatory Visit: Payer: Self-pay | Admitting: *Deleted

## 2016-09-13 VITALS — BP 118/70 | HR 73 | Resp 16 | Ht 64.0 in | Wt 232.0 lb

## 2016-09-13 DIAGNOSIS — Z951 Presence of aortocoronary bypass graft: Secondary | ICD-10-CM

## 2016-09-13 DIAGNOSIS — I2510 Atherosclerotic heart disease of native coronary artery without angina pectoris: Secondary | ICD-10-CM

## 2016-09-13 DIAGNOSIS — Z931 Gastrostomy status: Secondary | ICD-10-CM | POA: Diagnosis not present

## 2016-09-13 NOTE — Progress Notes (Signed)
PCP is Carlyle Dolly, MD Referring Provider is Sueanne Margarita, MD  Chief Complaint  Patient presents with  . Routine Post Op    s/p CABG 07/28/16 with a CXR    HPI: First postop visit after CABG 4 Patient presented with subendocardial MI and three-vessel CAD Patient had bilateral pulmonary emboli in mid 2017 and is on chronic    Xarelto Patient is recovering well without recurrent angina. Sternal incision is well-healed. Leg incision has superficial eschar which was debrided in the office today and a Neosporin dressing applied Chest x-ray shows clear lung fields without pleural effusion, sternal wires intact Patient is walking at home and has no CHF symptoms  Past Medical History:  Diagnosis Date  . Acid reflux   . Back pain   . Bilateral pulmonary embolism ([place]) 05/2016  . CAD in native artery    a. s/p CABGx4 07/28/16.  . Cataracts, bilateral   . CKD (chronic kidney disease), stage III    borderline CKD II-III  . Depression   . Diabetes mellitus ([place])   . GERD (gastroesophageal reflux disease)   . Glaucoma   . High cholesterol   . 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 Hx of chest tube placement right   . Hypertension   . MI (myocardial infarction) 2017  . Morbid obesity ([place])   . Neuropathy (HCC)    hands and feet  . Pneumonia   . Postoperative anemia 07/2016  . Stroke [facility]) 2005    Past Surgical History:  Procedure Laterality Date  . CARDIAC CATHETERIZATION N/A 07/25/2016   Procedure: Left Heart Cath and Coronary Angiography;  Surgeon: Askari Kinley M Martinique, MD;  Location: [facility];  Service: Cardiovascular;  Laterality: N/A;  . CORONARY ARTERY BYPASS GRAFT N/A 07/28/2016   Procedure: CORONARY ARTERY BYPASS GRAFTING (CABG)x4 using left internal mammary and endoscopic harvest of right greater saphenous vein;  Surgeon: Ivin Poot, MD;  Location: [facility];  Service: Open Heart Surgery;  Laterality: N/A;  . TEE WITHOUT CARDIOVERSION N/A 07/28/2016   Procedure:  TRANSESOPHAGEAL ECHOCARDIOGRAM (TEE);  Surgeon: Ivin Poot, MD;  Location: [facility];  Service: Open Heart Surgery;  Laterality: N/A;  . TUBAL LIGATION      Family History  Problem Relation Age of Onset  . Pancreatitis Mother   . Diabetes type II Mother   . CAD Father   . Diabetes type II Father   . Diabetes type II Sister   . Diabetes type II Brother     Social History Social History  Substance Use Topics  . Smoking status: Former Smoker    Quit date: 09/11/2005  . Smokeless tobacco: Never Used  . Alcohol use No    Current Outpatient Prescriptions  Medication Sig Dispense Refill  . acetaminophen-codeine (TYLENOL #3) 300-30 MG tablet Take 1 tablet by mouth every 6 (six) hours as needed for moderate pain. 20 tablet 0  . aspirin EC 325 MG EC tablet Take 1 tablet (325 mg total) by mouth daily. 30 tablet 0  . atorvastatin (LIPITOR) 80 MG tablet Take 1 tablet (80 mg total) by mouth every evening. 90 tablet 3  . benzonatate (TESSALON) 100 MG capsule Take 1 capsule (100 mg total) by mouth 3 (three) times daily as needed for cough. 30 capsule 0  . ferrous sulfate 325 (65 FE) MG tablet Take 1 tablet (325 mg total) by mouth daily with breakfast. 30 tablet 3  . furosemide (LASIX) 40 MG tablet Take 1 tablet (  40 mg total) by mouth daily. 30 tablet 0  . gabapentin (NEURONTIN) 400 MG capsule Take 400 mg by mouth 3 (three) times daily.    Marland Kitchen guaiFENesin (ROBITUSSIN) 100 MG/5ML SOLN Take 5 mLs (100 mg total) by mouth every 4 (four) hours as needed for cough or to loosen phlegm. 1200 mL 0  . insulin aspart (NOVOLOG) 100 UNIT/ML injection Inject 5 Units into the skin 2 (two) times daily before a meal.     . insulin glargine (LANTUS) 100 UNIT/ML injection Inject 0.35 mLs (35 Units total) into the skin at bedtime. 10 mL 11  . MELATONIN PO Take 1 tablet by mouth at bedtime.     . methocarbamol (ROBAXIN) 750 MG tablet Take 2 tablets (1,500 mg total) by mouth 2 (two) times daily as needed for muscle  spasms. 30 tablet 0  . metoprolol tartrate (LOPRESSOR) 25 MG tablet Take 25 mg by mouth 2 (two) times daily.    . Multiple Vitamins-Minerals (MULTIVITAMIN) tablet Take 1 tablet by mouth daily. 30 tablet 5  . omeprazole (PRILOSEC) 40 MG capsule Take 40 mg by mouth daily.    Marland Kitchen PARoxetine (PAXIL) 40 MG tablet Take 40 mg by mouth daily with breakfast.    . potassium chloride 20 MEQ TBCR Take 10 mEq by mouth daily. 30 tablet 0  . rivaroxaban (XARELTO) 20 MG TABS tablet Take 20 mg by mouth every morning.    . Probiotic Product (ALIGN) 4 MG CAPS Take 1 capsule (4 mg total) by mouth daily. (Patient not taking: Reported on 09/13/2016) 30 capsule 2   No current facility-administered medications for this visit.     Allergies  Allergen Reactions  . Lactose Intolerance (Gi) Diarrhea  . Lisinopril Other (See Comments) and Cough    Inflammation, coughing  . Reglan [Metoclopramide] Other (See Comments)    REACTION IS SIDE EFFECT Pt stated having lock jaw as a side effect  . Wellbutrin [Bupropion] Nausea And Vomiting and Cough  . Latex Rash  . Metformin And Related Diarrhea  . Penicillins Rash    [FROM PREVIOUS ENTRY-BEFORE 07/27/16] >>"ALL CILLINS"  Has patient had a PCN reaction causing immediate rash, facial/tongue/throat swelling, SOB or lightheadedness with hypotension: Yes Has patient had a PCN reaction causing severe rash involving mucus membranes or skin necrosis: No Has patient had a PCN reaction that required hospitalization No Has patient had a PCN reaction occurring within the last 10 years: Yes If all of the above answers are "NO", then may proceed with Cephalosporin use.   . Sulfa Antibiotics Itching  . Tape Rash    Review of Systems  No ankle swelling No sternal click sensations Not taking narcotics  BP 118/70 (BP Location: Left Arm, Patient Position: Sitting, Cuff Size: Large)   Pulse 73   Resp 16   Ht 5\' 4"  (1.626 m)   Wt 232 lb (105.2 kg)   LMP 08/18/2016 (Exact Date)    SpO2 98% Comment: ON RA  BMI 39.82 kg/m  Physical Exam       Exam    General- alert and comfortable   Lungs- clear without rales, wheezes   Cor- regular rate and rhythm, no murmur , gallop   Abdomen- soft, non-tender   Extremities - warm, non-tender, minimal edema   Neuro- oriented, appropriate, no focal weakness   Diagnostic Tests: Chest x-ray clear  Impression: Doing well 5 weeks postop CABG 4 Nonhealing right lower leg incision at saphenous vein harvest site was debrided and packed today Patient  will be given a week course of oral Levaquin-allergic to penicillin Patient referred to outpatient cardiac rehabilitation at [facility] Patient had ongoing home nursing and home physical therapy after her bilateral pulmonary emboli prior to urgent CABG and this will be continued She will return to the office for leg wound check on January 22  Plan: Return to office on January 22 Patient is able to drive or lift up to 20 pounds maximum until 3 months after surgery Patient will wash leg wound daily with soap and water and apply Neosporin dressing   Len Childs, MD [facility] [phone]

## 2016-09-14 ENCOUNTER — Telehealth: Payer: Self-pay | Admitting: Family Medicine

## 2016-09-14 DIAGNOSIS — I2510 Atherosclerotic heart disease of native coronary artery without angina pectoris: Secondary | ICD-10-CM | POA: Diagnosis not present

## 2016-09-14 DIAGNOSIS — E1122 Type 2 diabetes mellitus with diabetic chronic kidney disease: Secondary | ICD-10-CM | POA: Diagnosis not present

## 2016-09-14 DIAGNOSIS — I2782 Chronic pulmonary embolism: Secondary | ICD-10-CM | POA: Diagnosis not present

## 2016-09-14 DIAGNOSIS — I129 Hypertensive chronic kidney disease with stage 1 through stage 4 chronic kidney disease, or unspecified chronic kidney disease: Secondary | ICD-10-CM | POA: Diagnosis not present

## 2016-09-14 DIAGNOSIS — E1142 Type 2 diabetes mellitus with diabetic polyneuropathy: Secondary | ICD-10-CM | POA: Diagnosis not present

## 2016-09-14 DIAGNOSIS — Z48812 Encounter for surgical aftercare following surgery on the circulatory system: Secondary | ICD-10-CM | POA: Diagnosis not present

## 2016-09-14 NOTE — Telephone Encounter (Signed)
Alex from [facility] called because they need orders faxed for social services for the patient and they need to be signed by faculty member. Please fax these right away so that they can help the patient. [phone]. jw

## 2016-09-15 NOTE — Telephone Encounter (Signed)
Called back number to clarify requests. No answer. No option to leave voicemail. Unsure what orders need to be faxed. If [facility] calls back please have then leave a number I can contact them at. Thank you.   Smitty Cords, MD [place], PGY-2

## 2016-09-19 DIAGNOSIS — I129 Hypertensive chronic kidney disease with stage 1 through stage 4 chronic kidney disease, or unspecified chronic kidney disease: Secondary | ICD-10-CM | POA: Diagnosis not present

## 2016-09-19 DIAGNOSIS — I2782 Chronic pulmonary embolism: Secondary | ICD-10-CM | POA: Diagnosis not present

## 2016-09-19 DIAGNOSIS — E1122 Type 2 diabetes mellitus with diabetic chronic kidney disease: Secondary | ICD-10-CM | POA: Diagnosis not present

## 2016-09-19 DIAGNOSIS — I2510 Atherosclerotic heart disease of native coronary artery without angina pectoris: Secondary | ICD-10-CM | POA: Diagnosis not present

## 2016-09-19 DIAGNOSIS — E1142 Type 2 diabetes mellitus with diabetic polyneuropathy: Secondary | ICD-10-CM | POA: Diagnosis not present

## 2016-09-19 DIAGNOSIS — Z48812 Encounter for surgical aftercare following surgery on the circulatory system: Secondary | ICD-10-CM | POA: Diagnosis not present

## 2016-09-19 NOTE — Telephone Encounter (Signed)
Tamara Henry with [facility] requests orders for social services to be sent out from [facility]. This is to verify with pt what is needed such as transportation. Please call Tamara Henry at [phone]

## 2016-09-21 DIAGNOSIS — E1122 Type 2 diabetes mellitus with diabetic chronic kidney disease: Secondary | ICD-10-CM | POA: Diagnosis not present

## 2016-09-21 DIAGNOSIS — E1142 Type 2 diabetes mellitus with diabetic polyneuropathy: Secondary | ICD-10-CM | POA: Diagnosis not present

## 2016-09-21 DIAGNOSIS — Z48812 Encounter for surgical aftercare following surgery on the circulatory system: Secondary | ICD-10-CM | POA: Diagnosis not present

## 2016-09-21 DIAGNOSIS — I2510 Atherosclerotic heart disease of native coronary artery without angina pectoris: Secondary | ICD-10-CM | POA: Diagnosis not present

## 2016-09-21 DIAGNOSIS — I2782 Chronic pulmonary embolism: Secondary | ICD-10-CM | POA: Diagnosis not present

## 2016-09-21 DIAGNOSIS — I129 Hypertensive chronic kidney disease with stage 1 through stage 4 chronic kidney disease, or unspecified chronic kidney disease: Secondary | ICD-10-CM | POA: Diagnosis not present

## 2016-09-22 ENCOUNTER — Telehealth: Payer: Self-pay | Admitting: Family Medicine

## 2016-09-22 DIAGNOSIS — E113511 Type 2 diabetes mellitus with proliferative diabetic retinopathy with macular edema, right eye: Secondary | ICD-10-CM | POA: Diagnosis not present

## 2016-09-22 NOTE — Telephone Encounter (Signed)
Pt is at home and needing home health. [facility] is telling her the orders have never been received.  Please advise

## 2016-09-22 NOTE — Telephone Encounter (Signed)
Called patient back. I did not put in the orders for home health care for her, I believe that was placed by the inpatient team when she was hospitalized a couple months ago. Asked patient to please contact them and see if they can place/fix the order for her. She was appreciative of the call.   Smitty Cords, MD [place], PGY-2

## 2016-09-26 DIAGNOSIS — E1142 Type 2 diabetes mellitus with diabetic polyneuropathy: Secondary | ICD-10-CM | POA: Diagnosis not present

## 2016-09-26 DIAGNOSIS — I2782 Chronic pulmonary embolism: Secondary | ICD-10-CM | POA: Diagnosis not present

## 2016-09-26 DIAGNOSIS — I129 Hypertensive chronic kidney disease with stage 1 through stage 4 chronic kidney disease, or unspecified chronic kidney disease: Secondary | ICD-10-CM | POA: Diagnosis not present

## 2016-09-26 DIAGNOSIS — I2510 Atherosclerotic heart disease of native coronary artery without angina pectoris: Secondary | ICD-10-CM | POA: Diagnosis not present

## 2016-09-26 DIAGNOSIS — E1122 Type 2 diabetes mellitus with diabetic chronic kidney disease: Secondary | ICD-10-CM | POA: Diagnosis not present

## 2016-09-26 DIAGNOSIS — Z48812 Encounter for surgical aftercare following surgery on the circulatory system: Secondary | ICD-10-CM | POA: Diagnosis not present

## 2016-09-30 DIAGNOSIS — E1142 Type 2 diabetes mellitus with diabetic polyneuropathy: Secondary | ICD-10-CM | POA: Diagnosis not present

## 2016-09-30 DIAGNOSIS — E1122 Type 2 diabetes mellitus with diabetic chronic kidney disease: Secondary | ICD-10-CM | POA: Diagnosis not present

## 2016-09-30 DIAGNOSIS — I2510 Atherosclerotic heart disease of native coronary artery without angina pectoris: Secondary | ICD-10-CM | POA: Diagnosis not present

## 2016-09-30 DIAGNOSIS — I129 Hypertensive chronic kidney disease with stage 1 through stage 4 chronic kidney disease, or unspecified chronic kidney disease: Secondary | ICD-10-CM | POA: Diagnosis not present

## 2016-09-30 DIAGNOSIS — I2782 Chronic pulmonary embolism: Secondary | ICD-10-CM | POA: Diagnosis not present

## 2016-09-30 DIAGNOSIS — Z48812 Encounter for surgical aftercare following surgery on the circulatory system: Secondary | ICD-10-CM | POA: Diagnosis not present

## 2016-10-03 ENCOUNTER — Ambulatory Visit: Payer: Self-pay | Admitting: Family Medicine

## 2016-10-03 DIAGNOSIS — I2510 Atherosclerotic heart disease of native coronary artery without angina pectoris: Secondary | ICD-10-CM | POA: Diagnosis not present

## 2016-10-03 DIAGNOSIS — E1122 Type 2 diabetes mellitus with diabetic chronic kidney disease: Secondary | ICD-10-CM | POA: Diagnosis not present

## 2016-10-03 DIAGNOSIS — E1142 Type 2 diabetes mellitus with diabetic polyneuropathy: Secondary | ICD-10-CM | POA: Diagnosis not present

## 2016-10-03 DIAGNOSIS — I2782 Chronic pulmonary embolism: Secondary | ICD-10-CM | POA: Diagnosis not present

## 2016-10-03 DIAGNOSIS — Z48812 Encounter for surgical aftercare following surgery on the circulatory system: Secondary | ICD-10-CM | POA: Diagnosis not present

## 2016-10-03 DIAGNOSIS — I129 Hypertensive chronic kidney disease with stage 1 through stage 4 chronic kidney disease, or unspecified chronic kidney disease: Secondary | ICD-10-CM | POA: Diagnosis not present

## 2016-10-03 NOTE — Progress Notes (Deleted)
Subjective:    Patient ID: Tamara Henry , female   DOB: 07/24/1961 , 56 y.o..   MRN: [id number]  HPI  Tamara Henry is here for ***.     Review of Systems: Per HPI. All other systems reviewed and are negative.  Health Maintenance Due  Topic Date Due  . Hepatitis C Screening  05/07/1961  . OPHTHALMOLOGY EXAM  07/07/1973  . URINE MICROALBUMIN  07/07/1973  . HIV Screening  07/07/1978  . TETANUS/TDAP  07/07/1982  . PAP SMEAR  07/07/1984  . COLONOSCOPY  07/07/2013  . INFLUENZA VACCINE  04/11/2016    Past Medical History: Patient Active Problem List   Diagnosis Date Noted  . Nasal congestion 09/06/2016  . Anemia 08/30/2016  . Leg pain 08/30/2016  . Bilateral low back pain without sciatica 08/30/2016  . 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 Bilateral pulmonary embolism ([place]) 05/17/2016  . Hyperlipidemia 05/17/2016  . Depression 05/17/2016  . Diabetic peripheral neuropathy ([place]) 05/17/2016  . Neuritis of upper extremity, C7 01/15/2015  . DM type 2, uncontrolled, with renal complications ([place]) [id number]  . Hypertension 01/14/2015  . Numbness and tingling of right arm 01/14/2015    Medications: reviewed and updated Current Outpatient Prescriptions  Medication Sig Dispense Refill  . acetaminophen-codeine (TYLENOL #3) 300-30 MG tablet Take 1 tablet by mouth every 6 (six) hours as needed for moderate pain. 20 tablet 0  . aspirin EC 325 MG EC tablet Take 1 tablet (325 mg total) by mouth daily. 30 tablet 0  . atorvastatin (LIPITOR) 80 MG tablet Take 1 tablet (80 mg total) by mouth every evening. 90 tablet 3  . benzonatate (TESSALON) 100 MG capsule Take 1 capsule (100 mg total) by mouth 3 (three) times daily as needed for cough. 30 capsule 0  . ferrous sulfate 325 (65 FE) MG tablet Take 1 tablet (325 mg total) by mouth daily with breakfast. 30 tablet 3  . furosemide (LASIX) 40 MG tablet Take 1 tablet (40 mg total) by mouth daily. 30 tablet 0  . gabapentin (NEURONTIN) 400 MG capsule Take  400 mg by mouth 3 (three) times daily.    Marland Kitchen guaiFENesin (ROBITUSSIN) 100 MG/5ML SOLN Take 5 mLs (100 mg total) by mouth every 4 (four) hours as needed for cough or to loosen phlegm. 1200 mL 0  . insulin aspart (NOVOLOG) 100 UNIT/ML injection Inject 5 Units into the skin 2 (two) times daily before a meal.     . insulin glargine (LANTUS) 100 UNIT/ML injection Inject 0.35 mLs (35 Units total) into the skin at bedtime. 10 mL 11  . MELATONIN PO Take 1 tablet by mouth at bedtime.     . methocarbamol (ROBAXIN) 750 MG tablet Take 2 tablets (1,500 mg total) by mouth 2 (two) times daily as needed for muscle spasms. 30 tablet 0  . metoprolol tartrate (LOPRESSOR) 25 MG tablet Take 25 mg by mouth 2 (two) times daily.    . Multiple Vitamins-Minerals (MULTIVITAMIN) tablet Take 1 tablet by mouth daily. 30 tablet 5  . omeprazole (PRILOSEC) 40 MG capsule Take 40 mg by mouth daily.    Marland Kitchen PARoxetine (PAXIL) 40 MG tablet Take 40 mg by mouth daily with breakfast.    . potassium chloride 20 MEQ TBCR Take 10 mEq by mouth daily. 30 tablet 0  . Probiotic Product (ALIGN) 4 MG CAPS Take 1 capsule (4 mg total) by mouth daily. (Patient not taking: Reported on 09/13/2016) 30 capsule 2  . rivaroxaban (XARELTO) 20 MG TABS  tablet Take 20 mg by mouth every morning.     No current facility-administered medications for this visit.     Social Hx:  reports that she quit smoking about 11 years ago. She has never used smokeless tobacco.   Objective:   There were no vitals taken for this visit. Physical Exam  Gen: NAD, alert, cooperative with exam, well-appearing HEENT: NCAT, PERRL, clear conjunctiva, oropharynx clear, supple neck Cardiac: Regular rate and rhythm, normal S1/S2, no murmur, no edema, capillary refill brisk  Respiratory: Clear to auscultation bilaterally, no wheezes, non-labored breathing Gastrointestinal: soft, non tender, non distended, bowel sounds present Skin: no rashes, normal turgor  Neurological: no gross  deficits.  Psych: good insight, normal mood and affect   Assessment & Plan:  No problem-specific Assessment & Plan notes found for this encounter.   Smitty Cords, MD [place], PGY-2

## 2016-10-04 ENCOUNTER — Other Ambulatory Visit: Payer: Self-pay

## 2016-10-04 ENCOUNTER — Ambulatory Visit: Payer: Self-pay | Admitting: Cardiology

## 2016-10-09 ENCOUNTER — Ambulatory Visit (INDEPENDENT_AMBULATORY_CARE_PROVIDER_SITE_OTHER): Payer: Self-pay | Admitting: Physician Assistant

## 2016-10-09 VITALS — BP 140/82 | HR 110 | Resp 20 | Ht 64.0 in | Wt 230.0 lb

## 2016-10-09 DIAGNOSIS — I2510 Atherosclerotic heart disease of native coronary artery without angina pectoris: Secondary | ICD-10-CM

## 2016-10-09 DIAGNOSIS — Z951 Presence of aortocoronary bypass graft: Secondary | ICD-10-CM

## 2016-10-09 MED ORDER — LEVOFLOXACIN 500 MG PO TABS: 500.0000 mg | ORAL_TABLET | Freq: Every day | ORAL | 0 refills | Status: DC

## 2016-10-09 NOTE — Progress Notes (Signed)
HPI:  Patient returns for wound check of EVH site right leg. She is s/p CABG x 4 on 07/28/2016 by Dr. Prescott Gum. She was on Xarelto as she was found to have bilateral pulmonary emboli prior to her admission for coronary artery disease.  She has already seen Dr. Prescott Gum in follow up on 09/13/2016. He prescribed Levaquin as she had her right lower leg wound debrided and packed. Patient also states her neuropathy is getting worse, her sugar is not well controlled, and she continues to have tingling in last 2 fingers of left hand and her grip does not  seem as strong.  Current Outpatient Prescriptions  Medication Sig Dispense Refill  . acetaminophen-codeine (TYLENOL #3) 300-30 MG tablet Take 1 tablet by mouth every 6 (six) hours as needed for moderate pain. 20 tablet 0  . aspirin EC 325 MG EC tablet Take 1 tablet (325 mg total) by mouth daily. 30 tablet 0  . atorvastatin (LIPITOR) 80 MG tablet Take 1 tablet (80 mg total) by mouth every evening. 90 tablet 3  . benzonatate (TESSALON) 100 MG capsule Take 1 capsule (100 mg total) by mouth 3 (three) times daily as needed for cough. (Patient not taking: Reported on 10/09/2016) 30 capsule 0  . ferrous sulfate 325 (65 FE) MG tablet Take 1 tablet (325 mg total) by mouth daily with breakfast. 30 tablet 3  . furosemide (LASIX) 40 MG tablet Take 1 tablet (40 mg total) by mouth daily. 30 tablet 0  . gabapentin (NEURONTIN) 400 MG capsule Take 400 mg by mouth 3 (three) times daily.    Marland Kitchen guaiFENesin (ROBITUSSIN) 100 MG/5ML SOLN Take 5 mLs (100 mg total) by mouth every 4 (four) hours as needed for cough or to loosen phlegm. 1200 mL 0  . insulin aspart (NOVOLOG) 100 UNIT/ML injection Inject 5 Units into the skin 2 (two) times daily before a meal.     . insulin glargine (LANTUS) 100 UNIT/ML injection Inject 0.35 mLs (35 Units total) into the skin at bedtime. 10 mL 11  . MELATONIN PO Take 1 tablet by mouth at bedtime.     . methocarbamol (ROBAXIN) 750 MG tablet Take  2 tablets (1,500 mg total) by mouth 2 (two) times daily as needed for muscle spasms. 30 tablet 0  . metoprolol tartrate (LOPRESSOR) 25 MG tablet Take 25 mg by mouth 2 (two) times daily.    . Multiple Vitamins-Minerals (MULTIVITAMIN) tablet Take 1 tablet by mouth daily. 30 tablet 5  . omeprazole (PRILOSEC) 40 MG capsule Take 40 mg by mouth daily.    Marland Kitchen PARoxetine (PAXIL) 40 MG tablet Take 40 mg by mouth daily with breakfast.    . potassium chloride 20 MEQ TBCR Take 10 mEq by mouth daily. 30 tablet 0  . Probiotic Product (ALIGN) 4 MG CAPS Take 1 capsule (4 mg total) by mouth daily. (Patient not taking: Reported on 09/13/2016) 30 capsule 2  . rivaroxaban (XARELTO) 20 MG TABS tablet Take 20 mg by mouth every morning.    Vital Signs: BP 140/82, HR 110, RR 20, Oxygenation 95% on room air   Physical Exam: CV-Tachycardic Extremities-No LE edema Wounds-Sternal wound is well healed.Eschar on right lower leg EVH wound. Eschar was removed and wound is mostly clean. It is tender around wound. Neurologic-Grossly intact without focal deficits. Palpable radial and ulnar pulses bilaterally  Impression and Plan: Patient states she was not able to fill her Levaquin prescription. I gave her another prescription and then she asked me to  call it in to [facility] by [employer]. She states she is going to be homeless. Our office manager gave her several area numbers to help her social situation. Regarding her worsening neuropathy, I told her that her last creatinine was elevated at 1.68 and she needs to have this rechecked before increasing Neurontin. Also, she needs to have better glucose control (HGA1C 9.2) and again, needs to see her primary care physician. I will discuss with Dr. Prescott Gum, about further work up for her ongoing tingling in her left 2 fingers.She will return in one week for a wound check.    Nani Skillern, PA-C [facility] [phone]

## 2016-10-10 ENCOUNTER — Other Ambulatory Visit: Payer: Medicare Other | Admitting: *Deleted

## 2016-10-10 ENCOUNTER — Ambulatory Visit (INDEPENDENT_AMBULATORY_CARE_PROVIDER_SITE_OTHER): Payer: Medicare Other

## 2016-10-10 ENCOUNTER — Emergency Department (HOSPITAL_COMMUNITY)
Admission: EM | Admit: 2016-10-10 | Discharge: 2016-10-10 | Disposition: A | Discharge status: Home / Self Care | Payer: Medicare Other | Attending: Emergency Medicine | Admitting: Emergency Medicine

## 2016-10-10 ENCOUNTER — Encounter (HOSPITAL_COMMUNITY): Payer: Self-pay | Admitting: Emergency Medicine

## 2016-10-10 DIAGNOSIS — E1140 Type 2 diabetes mellitus with diabetic neuropathy, unspecified: Secondary | ICD-10-CM | POA: Diagnosis not present

## 2016-10-10 DIAGNOSIS — I129 Hypertensive chronic kidney disease with stage 1 through stage 4 chronic kidney disease, or unspecified chronic kidney disease: Secondary | ICD-10-CM | POA: Diagnosis not present

## 2016-10-10 DIAGNOSIS — I10 Essential (primary) hypertension: Secondary | ICD-10-CM

## 2016-10-10 DIAGNOSIS — Z8673 Personal history of transient ischemic attack (TIA), and cerebral infarction without residual deficits: Secondary | ICD-10-CM | POA: Diagnosis not present

## 2016-10-10 DIAGNOSIS — I2510 Atherosclerotic heart disease of native coronary artery without angina pectoris: Secondary | ICD-10-CM | POA: Diagnosis not present

## 2016-10-10 DIAGNOSIS — Z7901 Long term (current) use of anticoagulants: Secondary | ICD-10-CM | POA: Diagnosis not present

## 2016-10-10 DIAGNOSIS — Z7982 Long term (current) use of aspirin: Secondary | ICD-10-CM | POA: Insufficient documentation

## 2016-10-10 DIAGNOSIS — Z91040 Latex allergy status: Secondary | ICD-10-CM | POA: Diagnosis not present

## 2016-10-10 DIAGNOSIS — Z794 Long term (current) use of insulin: Secondary | ICD-10-CM | POA: Diagnosis not present

## 2016-10-10 DIAGNOSIS — Z760 Encounter for issue of repeat prescription: Secondary | ICD-10-CM | POA: Diagnosis not present

## 2016-10-10 DIAGNOSIS — Z951 Presence of aortocoronary bypass graft: Secondary | ICD-10-CM | POA: Insufficient documentation

## 2016-10-10 DIAGNOSIS — I2782 Chronic pulmonary embolism: Secondary | ICD-10-CM | POA: Diagnosis not present

## 2016-10-10 DIAGNOSIS — N183 Chronic kidney disease, stage 3 (moderate): Secondary | ICD-10-CM | POA: Diagnosis not present

## 2016-10-10 DIAGNOSIS — E7800 Pure hypercholesterolemia, unspecified: Secondary | ICD-10-CM

## 2016-10-10 DIAGNOSIS — Z87891 Personal history of nicotine dependence: Secondary | ICD-10-CM | POA: Diagnosis not present

## 2016-10-10 DIAGNOSIS — I252 Old myocardial infarction: Secondary | ICD-10-CM | POA: Insufficient documentation

## 2016-10-10 DIAGNOSIS — R030 Elevated blood-pressure reading, without diagnosis of hypertension: Secondary | ICD-10-CM | POA: Diagnosis not present

## 2016-10-10 DIAGNOSIS — E1122 Type 2 diabetes mellitus with diabetic chronic kidney disease: Secondary | ICD-10-CM | POA: Diagnosis not present

## 2016-10-10 DIAGNOSIS — E1142 Type 2 diabetes mellitus with diabetic polyneuropathy: Secondary | ICD-10-CM | POA: Diagnosis not present

## 2016-10-10 DIAGNOSIS — Z48812 Encounter for surgical aftercare following surgery on the circulatory system: Secondary | ICD-10-CM | POA: Diagnosis not present

## 2016-10-10 LAB — EXERCISE TOLERANCE TEST
CHL CUP RESTING HR STRESS: 113 {beats}/min
CHL CUP STRESS STAGE 1 HR: 104 {beats}/min
CHL CUP STRESS STAGE 2 GRADE: 0 %
CHL CUP STRESS STAGE 2 SPEED: 0 mph
CHL CUP STRESS STAGE 3 GRADE: 0 %
CHL CUP STRESS STAGE 3 HR: 107 {beats}/min
CHL CUP STRESS STAGE 5 GRADE: 10 %
CHL CUP STRESS STAGE 5 SPEED: 1.7 mph
CHL CUP STRESS STAGE 6 DBP: 74 mmHg
CHL CUP STRESS STAGE 6 GRADE: 0 %
CHL CUP STRESS STAGE 6 HR: 131 {beats}/min
CHL CUP STRESS STAGE 7 DBP: 90 mmHg
CHL CUP STRESS STAGE 7 GRADE: 0 %
CHL RATE OF PERCEIVED EXERTION: 17
CSEPED: 1 min
CSEPEDS: 22 s
CSEPHR: 85 %
CSEPPHR: 139 {beats}/min
CSEPPMHR: 83 %
Estimated workload: 3.6 METS
MPHR: 167 {beats}/min
Stage 1 DBP: 83 mmHg
Stage 1 Grade: 0 %
Stage 1 SBP: 122 mmHg
Stage 1 Speed: 0 mph
Stage 2 HR: 98 {beats}/min
Stage 3 Speed: 1 mph
Stage 4 Grade: 0 %
Stage 4 HR: 107 {beats}/min
Stage 4 Speed: 1 mph
Stage 5 HR: 139 {beats}/min
Stage 6 SBP: 172 mmHg
Stage 6 Speed: 0 mph
Stage 7 HR: 99 {beats}/min
Stage 7 SBP: 154 mmHg
Stage 7 Speed: 0 mph

## 2016-10-10 MED ORDER — AMIODARONE HCL 200 MG PO TABS: 200.0000 mg | ORAL_TABLET | Freq: Every day | ORAL | 0 refills | Status: DC

## 2016-10-10 MED ORDER — METOPROLOL TARTRATE 25 MG PO TABS
25.0000 mg | ORAL_TABLET | Freq: Once | ORAL | Status: AC
  Administered 2016-10-10: 25 mg via ORAL
  Filled 2016-10-10: qty 1

## 2016-10-10 MED ORDER — METOPROLOL TARTRATE 25 MG PO TABS: 25.0000 mg | ORAL_TABLET | Freq: Two times a day (BID) | ORAL | 0 refills | Status: DC

## 2016-10-10 MED ORDER — AMIODARONE HCL 200 MG PO TABS
200.0000 mg | ORAL_TABLET | Freq: Every day | ORAL | Status: DC
  Administered 2016-10-10: 200 mg via ORAL
  Filled 2016-10-10: qty 1

## 2016-10-10 NOTE — ED Triage Notes (Signed)
Per EMS: pt here for htn; pt sts out of htn meds at present; denies sx; BP 146/92 in route

## 2016-10-10 NOTE — Telephone Encounter (Signed)
Pt called back stating things are worse. Pt is homeless. Pt states her home health nurse has been trying to contact us to get permission to send a social worker out. Pt also states she needs blood work to check on her kidneys per heart doctor before taking more gabapentin. Please advise. ep

## 2016-10-10 NOTE — ED Notes (Signed)
Pt verbalized understanding of discharge instructions and has no further questions. VSS, NAD A/Ox4

## 2016-10-10 NOTE — ED Notes (Signed)
Cab voucher provided; [employer] called for pt

## 2016-10-10 NOTE — ED Provider Notes (Signed)
[facility] DEPT Provider Note   CSN: [id number] Arrival date & time: 10/10/16  1631  History   Chief Complaint Chief Complaint  Patient presents with  . Hypertension   HPI   Tamara Henry is a 56 y.o. Female with multiple medical issues who presents to the ED for med refill. She has some baseline memory issues due to prior stroke and states she knows she is out of lopressor and one other med. States her daughter gives her medications and gave me the phone number to talk to her daughter Derry Skill). Pt states she went for an outpatient stress test and her BP was noted to be elevated ~160/100. She denies any complaints at this time. Denies chest pain or SOB. Denies headache or blurred vision. I spoke with her daughter via telephone and states pt needs refill of lopresser 25mg  BID and amiodarone 200mg  daily.  Past Medical History:  Diagnosis Date  . Acid reflux   . Back pain   . Bilateral pulmonary embolism ([place]) 05/2016  . CAD in native artery    a. s/p CABGx4 07/28/16.  . Cataracts, bilateral   . CKD (chronic kidney disease), stage III    borderline CKD II-III  . Depression   . Diabetes mellitus ([place])   . GERD (gastroesophageal reflux disease)   . Glaucoma   . High cholesterol   . 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 Hx of chest tube placement right   . Hypertension   . MI (myocardial infarction) 2017  . Morbid obesity ([place])   . Neuropathy (HCC)    hands and feet  . Pneumonia   . Postoperative anemia 07/2016  . Stroke [facility]) 2005    Past Surgical History:  Procedure Laterality Date  . CARDIAC CATHETERIZATION N/A 07/25/2016   Procedure: Left Heart Cath and Coronary Angiography;  Surgeon: Peter M Martinique, MD;  Location: [facility] CV LAB;  Service: Cardiovascular;  Laterality: N/A;  . CORONARY ARTERY BYPASS GRAFT N/A 07/28/2016   Procedure: CORONARY ARTERY BYPASS GRAFTING (CABG)x4 using left internal mammary and endoscopic harvest of right greater saphenous vein;  Surgeon: Ivin Poot,  MD;  Location: [facility];  Service: Open Heart Surgery;  Laterality: N/A;  . TEE WITHOUT CARDIOVERSION N/A 07/28/2016   Procedure: TRANSESOPHAGEAL ECHOCARDIOGRAM (TEE);  Surgeon: Ivin Poot, MD;  Location: [facility];  Service: Open Heart Surgery;  Laterality: N/A;  . TUBAL LIGATION      OB History    Gravida Para Term Preterm AB Living   5 4 4   1 4    SAB TAB Ectopic Multiple Live Births     1             Home Medications    Prior to Admission medications   Medication Sig Start Date End Date Taking? Authorizing Provider  amLODipine (NORVASC) 10 MG tablet Take 10 mg by mouth daily.   Yes Historical Provider, MD  aspirin EC 325 MG EC tablet Take 1 tablet (325 mg total) by mouth daily. 08/08/16  Yes Erin R Barrett, PA-C  acetaminophen-codeine (TYLENOL #3) 300-30 MG tablet Take 1 tablet by mouth every 6 (six) hours as needed for moderate pain. Patient not taking: Reported on 10/10/2016 09/06/16   Steve Rattler, DO  atorvastatin (LIPITOR) 80 MG tablet Take 1 tablet (80 mg total) by mouth every evening. 08/17/16 11/15/16  Dayna N Dunn, PA-C  benzonatate (TESSALON) 100 MG capsule Take 1 capsule (100 mg total) by mouth 3 (three) times daily as needed  for cough. Patient not taking: Reported on 10/09/2016 09/06/16   Steve Rattler, DO  ferrous sulfate 325 (65 FE) MG tablet Take 1 tablet (325 mg total) by mouth daily with breakfast. 08/22/16   Carlyle Dolly, MD  furosemide (LASIX) 40 MG tablet Take 1 tablet (40 mg total) by mouth daily. 08/07/16   Erin R Barrett, PA-C  gabapentin (NEURONTIN) 400 MG capsule Take 400 mg by mouth 3 (three) times daily. 03/16/16   Historical Provider, MD  guaiFENesin (ROBITUSSIN) 100 MG/5ML SOLN Take 5 mLs (100 mg total) by mouth every 4 (four) hours as needed for cough or to loosen phlegm. 09/06/16   Steve Rattler, DO  insulin aspart (NOVOLOG) 100 UNIT/ML injection Inject 5 Units into the skin 2 (two) times daily before a meal.     Historical Provider, MD  insulin  glargine (LANTUS) 100 UNIT/ML injection Inject 0.35 mLs (35 Units total) into the skin at bedtime. 08/30/16   Carlyle Dolly, MD  levofloxacin (LEVAQUIN) 500 MG tablet Take 1 tablet (500 mg total) by mouth daily. 10/09/16   Donielle Liston Alba, PA-C  MELATONIN PO Take 1 tablet by mouth at bedtime.     Historical Provider, MD  methocarbamol (ROBAXIN) 750 MG tablet Take 2 tablets (1,500 mg total) by mouth 2 (two) times daily as needed for muscle spasms. 08/30/16   Carlyle Dolly, MD  metoprolol tartrate (LOPRESSOR) 25 MG tablet Take 25 mg by mouth 2 (two) times daily.    Historical Provider, MD  Multiple Vitamins-Minerals (MULTIVITAMIN) tablet Take 1 tablet by mouth daily. 08/22/16   Carlyle Dolly, MD  omeprazole (PRILOSEC) 40 MG capsule Take 40 mg by mouth daily. 03/16/16   Historical Provider, MD  PARoxetine (PAXIL) 40 MG tablet Take 40 mg by mouth daily with breakfast.    Historical Provider, MD  potassium chloride 20 MEQ TBCR Take 10 mEq by mouth daily. 08/07/16   Erin R Barrett, PA-C  Probiotic Product (ALIGN) 4 MG CAPS Take 1 capsule (4 mg total) by mouth daily. Patient not taking: Reported on 09/13/2016 06/03/16   Nehemiah Settle, NP  rivaroxaban (XARELTO) 20 MG TABS tablet Take 20 mg by mouth every morning.    Historical Provider, MD    Family History Family History  Problem Relation Age of Onset  . Pancreatitis Mother   . Diabetes type II Mother   . CAD Father   . Diabetes type II Father   . Diabetes type II Sister   . Diabetes type II Brother     Social History Social History  Substance Use Topics  . Smoking status: Former Smoker    Quit date: 09/11/2005  . Smokeless tobacco: Never Used  . Alcohol use No     Allergies   Lactose intolerance (gi); Lisinopril; Reglan [metoclopramide]; Wellbutrin [bupropion]; Latex; Metformin and related; Penicillins; Sulfa antibiotics; and Tape   Review of Systems Review of Systems 10 Systems reviewed and are negative for  acute change except as noted in the HPI.  Physical Exam Updated Vital Signs BP 164/89 (BP Location: Left Arm)   Pulse 96   Temp 98.4 F (36.9 C) (Oral)   Resp 18   SpO2 100%   Physical Exam  Constitutional: She is oriented to person, place, and time. No distress.  HENT:  Head: Atraumatic.  Right Ear: External ear normal.  Left Ear: External ear normal.  Nose: Nose normal.  Eyes: Conjunctivae are normal. No scleral icterus.  Cardiovascular: Normal rate, regular  rhythm, normal heart sounds and intact distal pulses.   Pulmonary/Chest: Effort normal and breath sounds normal. No respiratory distress. She has no wheezes. She has no rales. She exhibits no tenderness.  Abdominal: She exhibits no distension.  Neurological: She is alert and oriented to person, place, and time.  Skin: Skin is warm and dry. She is not diaphoretic.  Psychiatric: She has a normal mood and affect. Her behavior is normal.  Nursing note and vitals reviewed.    ED Treatments / Results  Labs (all labs ordered are listed, but only abnormal results are displayed) Labs Reviewed - No data to display  EKG  EKG Interpretation None       Radiology No results found.  Procedures Procedures (including critical care time)  Medications Ordered in ED Medications - No data to display   Initial Impression / Assessment and Plan / ED Course  I have reviewed the triage vital signs and the nursing notes.  Pertinent labs & imaging results that were available during my care of the patient were reviewed by me and considered in my medical decision making (see chart for details).   pt presenting for med refill of her lopressor and amiodarone. BP was noted to be elevated as an outpatient. It is 164/89 here. She has zero complaints at this time. Completely denies any chest pain, SOB, headache, blurred vision. Will give rx for 30 day supply and give first dose here. Discussed with pt and her daughter (via telephone) to  schedule PCP appt asap. Return precautions given.  Final Clinical Impressions(s) / ED Diagnoses   Final diagnoses:  Encounter for medication refill  Hypertension, unspecified type       Anne Ng, PA-C 10/11/16 [id number]    Fredia Sorrow, MD 10/12/16 [phone]

## 2016-10-10 NOTE — Discharge Instructions (Signed)
I gave you prescriptions for the two medications you need refills of. I gave you 30 day supplies of each. Take as prescribed. Follow up with your primary care provider as soon as possible. Return to the ER for new or worsening symptoms.

## 2016-10-11 ENCOUNTER — Telehealth: Payer: Self-pay | Admitting: Cardiology

## 2016-10-11 LAB — LIPID PANEL
Chol/HDL Ratio: 4.4 ratio units (ref 0.0–4.4)
Cholesterol, Total: 256 mg/dL — ABNORMAL HIGH (ref 100–199)
HDL: 58 mg/dL (ref >39)
LDL Calculated: 142 mg/dL — ABNORMAL HIGH (ref 0–99)
TRIGLYCERIDES: 278 mg/dL — AB (ref 0–149)
VLDL Cholesterol Cal: 56 mg/dL — ABNORMAL HIGH (ref 5–40)

## 2016-10-11 LAB — COMPREHENSIVE METABOLIC PANEL
A/G RATIO: 1 — AB (ref 1.2–2.2)
ALT: 17 IU/L (ref 0–32)
AST: 15 IU/L (ref 0–40)
Albumin: 3.6 g/dL (ref 3.5–5.5)
Alkaline Phosphatase: 158 IU/L — ABNORMAL HIGH (ref 39–117)
BUN/Creatinine Ratio: 17 (ref 9–23)
BUN: 23 mg/dL (ref 6–24)
CHLORIDE: 96 mmol/L (ref 96–106)
CO2: 20 mmol/L (ref 18–29)
Calcium: 9.1 mg/dL (ref 8.7–10.2)
Creatinine, Ser: 1.34 mg/dL — ABNORMAL HIGH (ref 0.57–1.00)
GFR calc non Af Amer: 45 mL/min/{1.73_m2} — ABNORMAL LOW (ref >59)
GFR, EST AFRICAN AMERICAN: 52 mL/min/{1.73_m2} — AB (ref >59)
GLOBULIN, TOTAL: 3.7 g/dL (ref 1.5–4.5)
Glucose: 310 mg/dL — ABNORMAL HIGH (ref 65–99)
POTASSIUM: 4.5 mmol/L (ref 3.5–5.2)
Sodium: 138 mmol/L (ref 134–144)
Total Protein: 7.3 g/dL (ref 6.0–8.5)

## 2016-10-11 NOTE — Telephone Encounter (Signed)
New message    Pt calling to say that she did what you instructed her to do, and it worked out for her. She said everything was ok.

## 2016-10-12 DIAGNOSIS — Z48812 Encounter for surgical aftercare following surgery on the circulatory system: Secondary | ICD-10-CM | POA: Diagnosis not present

## 2016-10-12 DIAGNOSIS — I2510 Atherosclerotic heart disease of native coronary artery without angina pectoris: Secondary | ICD-10-CM | POA: Diagnosis not present

## 2016-10-12 DIAGNOSIS — I129 Hypertensive chronic kidney disease with stage 1 through stage 4 chronic kidney disease, or unspecified chronic kidney disease: Secondary | ICD-10-CM | POA: Diagnosis not present

## 2016-10-12 DIAGNOSIS — E1142 Type 2 diabetes mellitus with diabetic polyneuropathy: Secondary | ICD-10-CM | POA: Diagnosis not present

## 2016-10-12 DIAGNOSIS — I2782 Chronic pulmonary embolism: Secondary | ICD-10-CM | POA: Diagnosis not present

## 2016-10-12 DIAGNOSIS — E1122 Type 2 diabetes mellitus with diabetic chronic kidney disease: Secondary | ICD-10-CM | POA: Diagnosis not present

## 2016-10-13 ENCOUNTER — Ambulatory Visit (INDEPENDENT_AMBULATORY_CARE_PROVIDER_SITE_OTHER): Payer: Medicare Other | Admitting: Family Medicine

## 2016-10-13 ENCOUNTER — Ambulatory Visit: Payer: Self-pay | Admitting: Family Medicine

## 2016-10-13 ENCOUNTER — Encounter: Payer: Self-pay | Admitting: Licensed Clinical Social Worker

## 2016-10-13 ENCOUNTER — Encounter: Payer: Self-pay | Admitting: Family Medicine

## 2016-10-13 VITALS — BP 138/82 | HR 100 | Temp 98.7°F | Ht 64.0 in | Wt 229.4 lb

## 2016-10-13 DIAGNOSIS — E1142 Type 2 diabetes mellitus with diabetic polyneuropathy: Secondary | ICD-10-CM

## 2016-10-13 DIAGNOSIS — I2510 Atherosclerotic heart disease of native coronary artery without angina pectoris: Secondary | ICD-10-CM | POA: Diagnosis not present

## 2016-10-13 DIAGNOSIS — IMO0002 Reserved for concepts with insufficient information to code with codable children: Secondary | ICD-10-CM

## 2016-10-13 DIAGNOSIS — I2699 Other pulmonary embolism without acute cor pulmonale: Secondary | ICD-10-CM | POA: Diagnosis not present

## 2016-10-13 DIAGNOSIS — Z951 Presence of aortocoronary bypass graft: Secondary | ICD-10-CM

## 2016-10-13 DIAGNOSIS — E1121 Type 2 diabetes mellitus with diabetic nephropathy: Secondary | ICD-10-CM

## 2016-10-13 DIAGNOSIS — Z23 Encounter for immunization: Secondary | ICD-10-CM | POA: Diagnosis not present

## 2016-10-13 DIAGNOSIS — E1165 Type 2 diabetes mellitus with hyperglycemia: Secondary | ICD-10-CM | POA: Diagnosis not present

## 2016-10-13 DIAGNOSIS — Z659 Problem related to unspecified psychosocial circumstances: Secondary | ICD-10-CM | POA: Insufficient documentation

## 2016-10-13 DIAGNOSIS — Z794 Long term (current) use of insulin: Secondary | ICD-10-CM

## 2016-10-13 DIAGNOSIS — N183 Chronic kidney disease, stage 3 unspecified: Secondary | ICD-10-CM

## 2016-10-13 MED ORDER — RIVAROXABAN 20 MG PO TABS: 20.0000 mg | ORAL_TABLET | Freq: Every morning | ORAL | 0 refills | Status: DC

## 2016-10-13 MED ORDER — GABAPENTIN 300 MG PO CAPS: 600.0000 mg | ORAL_CAPSULE | Freq: Three times a day (TID) | ORAL | 3 refills | Status: DC

## 2016-10-13 NOTE — Assessment & Plan Note (Signed)
Xarelto refilled and sent to pharmacy

## 2016-10-13 NOTE — Progress Notes (Signed)
Subjective:     Patient ID: Tamara Henry, female   DOB: Jun 22, 1961, 56 y.o.   MRN: [id number]  HPI Mrs. Schwertner is a 56yo female presenting today for social concerns and chronic kidney disease. Reports she is currently homeless and living with her daughter in a [employer] [address]. She has begun the process of getting another apartment and will hopefully be able to move in soon. Has home health and transportation in place. Requests social worker until housing is in place, but has had trouble with this being ordered Notes she is out of her Xarelto for several days and does not have a refill. She thought it is coming in the mail, but is unsure. Requests refill to pharmacy to hold her over until she receives mail prescription. Also notes kidney dysfunction since last year. Cardiologist has been wanting to increase her Gabapentin, but wanted her kidney function checked first. At ED visit on 1/30, creatinine was checked and appears to have improved from last documented to 1.34. Notes worsening Neuropathy in bilateral feet and would like increase in Gabapentin if possible. Does not follow with nephrology.  Review of Systems Per HPI    Objective:   Physical Exam  Constitutional: She appears well-developed and well-nourished. No distress.  Cardiovascular: Normal rate and regular rhythm.   No murmur heard. Pulmonary/Chest: Effort normal. No respiratory distress. She has no wheezes.  Abdominal: Soft. She exhibits no distension. There is no tenderness.  Musculoskeletal: She exhibits no edema.  Skin: No rash noted.  Psychiatric: She has a normal mood and affect. Her behavior is normal.      Assessment and Plan:     Bilateral pulmonary embolism ([place]) Xarelto refilled and sent to pharmacy  DM type 2, uncontrolled, with renal complications ([place]) Next A1C due in two weeks. Referral to Nephrology placed. To return to see PCP in two weeks to discuss diabetes.   Diabetic peripheral neuropathy (HCC) Gabapentin  increased to 600mg  three times daily (Creatinine Clearance with adjustment for BMI 64)  Poor social situation Currently homeless. Discussed with [facility] and they have been unable to get the orders given for social work to be accepted by the social workers they work with and that they need an order from an attending. Discussed with Dr. Ree Kida and verbal order given for social services to aid with housing and other social issues. Also seen by Social Work in our office today and food and other resources given--please see her note.

## 2016-10-13 NOTE — Patient Instructions (Signed)
Thank you so much for coming to visit today! I have placed a referral to Nephrology. We can increase your gabapentin to 600mg  three times daily. I have refilled your Xarelto. Please return in two weeks, and we will check your labs and discuss your diabetes at that visit.   Dr. Gerlean Ren

## 2016-10-13 NOTE — Assessment & Plan Note (Deleted)
Xarelto refilled. Follow up with Cardiology.

## 2016-10-13 NOTE — Assessment & Plan Note (Addendum)
Next A1C due in two weeks. Referral to Nephrology placed. To return to see PCP in two weeks to discuss diabetes.

## 2016-10-13 NOTE — Progress Notes (Signed)
Social work consult from Dr. Gerlean Ren, Patient is homeless. Assessed patient for needs associated with the above consult.  Patient lives with her daughter and 3 grandchildren in the [employer] [address] (about 2 weeks).  They have located an apartment, trying to pay deposit and get utilities turned on.  Patient utilizes [employer], and has a Conservation officer, nature. Only income is her disability check.   Patient is requesting: a Education officer, museum with well care to assist until housing is resolved and food until her daughter receives her food stamps this month.   LCSW provided patient with a food box, resources for additional food pantries and hot meals.  Also provided information for patient to share with her daughter about [employer] Work Stage manager.  Plan:  information shared with PCP to review for possible order for [facility] Social Worker at patient's request.  Casimer Lanius, LCSW Licensed Clinical Social Worker [place]   [phone] 2:03 PM

## 2016-10-13 NOTE — Assessment & Plan Note (Signed)
Currently homeless. Discussed with [facility] and they have been unable to get the orders given for social work to be accepted by the social workers they work with and that they need an order from an attending. Discussed with Dr. Ree Kida and verbal order given for social services to aid with housing and other social issues. Also seen by Social Work in our office today and food and other resources given--please see her note.

## 2016-10-13 NOTE — Assessment & Plan Note (Signed)
Gabapentin increased to 600mg  three times daily (Creatinine Clearance with adjustment for BMI 64)

## 2016-10-17 ENCOUNTER — Telehealth: Payer: Self-pay | Admitting: Family Medicine

## 2016-10-17 DIAGNOSIS — I2782 Chronic pulmonary embolism: Secondary | ICD-10-CM | POA: Diagnosis not present

## 2016-10-17 DIAGNOSIS — E1142 Type 2 diabetes mellitus with diabetic polyneuropathy: Secondary | ICD-10-CM | POA: Diagnosis not present

## 2016-10-17 DIAGNOSIS — E1122 Type 2 diabetes mellitus with diabetic chronic kidney disease: Secondary | ICD-10-CM | POA: Diagnosis not present

## 2016-10-17 DIAGNOSIS — I129 Hypertensive chronic kidney disease with stage 1 through stage 4 chronic kidney disease, or unspecified chronic kidney disease: Secondary | ICD-10-CM | POA: Diagnosis not present

## 2016-10-17 DIAGNOSIS — Z48812 Encounter for surgical aftercare following surgery on the circulatory system: Secondary | ICD-10-CM | POA: Diagnosis not present

## 2016-10-17 DIAGNOSIS — I2510 Atherosclerotic heart disease of native coronary artery without angina pectoris: Secondary | ICD-10-CM | POA: Diagnosis not present

## 2016-10-17 NOTE — Telephone Encounter (Signed)
Insurance has notified pt that they no longer pay for lantus 100ML.  What can she take in its place?  She has medicare and medicaid. She now uses [facility] on [address]

## 2016-10-18 ENCOUNTER — Ambulatory Visit: Payer: Self-pay

## 2016-10-19 DIAGNOSIS — E1142 Type 2 diabetes mellitus with diabetic polyneuropathy: Secondary | ICD-10-CM | POA: Diagnosis not present

## 2016-10-19 DIAGNOSIS — I2782 Chronic pulmonary embolism: Secondary | ICD-10-CM | POA: Diagnosis not present

## 2016-10-19 DIAGNOSIS — I129 Hypertensive chronic kidney disease with stage 1 through stage 4 chronic kidney disease, or unspecified chronic kidney disease: Secondary | ICD-10-CM | POA: Diagnosis not present

## 2016-10-19 DIAGNOSIS — E1122 Type 2 diabetes mellitus with diabetic chronic kidney disease: Secondary | ICD-10-CM | POA: Diagnosis not present

## 2016-10-19 DIAGNOSIS — I2510 Atherosclerotic heart disease of native coronary artery without angina pectoris: Secondary | ICD-10-CM | POA: Diagnosis not present

## 2016-10-19 DIAGNOSIS — Z48812 Encounter for surgical aftercare following surgery on the circulatory system: Secondary | ICD-10-CM | POA: Diagnosis not present

## 2016-10-19 NOTE — Telephone Encounter (Signed)
White team please call patient and ask her to make an appointment with me in 2 weeks to discuss her diabetes medications and to recheck her hemoglobin A1C. Thank you.   Smitty Cords, MD [place], PGY-2

## 2016-10-19 NOTE — Telephone Encounter (Signed)
Appt made for 11/01/16 with Dr. Juanito Doom. Ottis Stain, CMA

## 2016-10-21 ENCOUNTER — Encounter (HOSPITAL_COMMUNITY): Payer: Self-pay | Admitting: Certified Nurse Midwife

## 2016-10-21 ENCOUNTER — Emergency Department (HOSPITAL_COMMUNITY): Payer: Medicare Other

## 2016-10-21 ENCOUNTER — Telehealth: Payer: Self-pay | Admitting: Thoracic Surgery (Cardiothoracic Vascular Surgery)

## 2016-10-21 ENCOUNTER — Emergency Department (HOSPITAL_COMMUNITY)
Admission: EM | Admit: 2016-10-21 | Discharge: 2016-10-21 | Disposition: A | Discharge status: Home / Self Care | Payer: Medicare Other | Attending: Emergency Medicine | Admitting: Emergency Medicine

## 2016-10-21 ENCOUNTER — Other Ambulatory Visit: Payer: Self-pay

## 2016-10-21 DIAGNOSIS — Z7901 Long term (current) use of anticoagulants: Secondary | ICD-10-CM | POA: Insufficient documentation

## 2016-10-21 DIAGNOSIS — R0789 Other chest pain: Secondary | ICD-10-CM | POA: Diagnosis not present

## 2016-10-21 DIAGNOSIS — E1122 Type 2 diabetes mellitus with diabetic chronic kidney disease: Secondary | ICD-10-CM | POA: Insufficient documentation

## 2016-10-21 DIAGNOSIS — Z7982 Long term (current) use of aspirin: Secondary | ICD-10-CM | POA: Insufficient documentation

## 2016-10-21 DIAGNOSIS — Z8673 Personal history of transient ischemic attack (TIA), and cerebral infarction without residual deficits: Secondary | ICD-10-CM | POA: Insufficient documentation

## 2016-10-21 DIAGNOSIS — Z951 Presence of aortocoronary bypass graft: Secondary | ICD-10-CM | POA: Diagnosis not present

## 2016-10-21 DIAGNOSIS — J45909 Unspecified asthma, uncomplicated: Secondary | ICD-10-CM | POA: Diagnosis not present

## 2016-10-21 DIAGNOSIS — I129 Hypertensive chronic kidney disease with stage 1 through stage 4 chronic kidney disease, or unspecified chronic kidney disease: Secondary | ICD-10-CM | POA: Diagnosis not present

## 2016-10-21 DIAGNOSIS — I252 Old myocardial infarction: Secondary | ICD-10-CM | POA: Insufficient documentation

## 2016-10-21 DIAGNOSIS — N183 Chronic kidney disease, stage 3 (moderate): Secondary | ICD-10-CM | POA: Diagnosis not present

## 2016-10-21 DIAGNOSIS — Z794 Long term (current) use of insulin: Secondary | ICD-10-CM | POA: Insufficient documentation

## 2016-10-21 DIAGNOSIS — Z87891 Personal history of nicotine dependence: Secondary | ICD-10-CM | POA: Insufficient documentation

## 2016-10-21 DIAGNOSIS — I2510 Atherosclerotic heart disease of native coronary artery without angina pectoris: Secondary | ICD-10-CM | POA: Insufficient documentation

## 2016-10-21 DIAGNOSIS — Z91040 Latex allergy status: Secondary | ICD-10-CM | POA: Insufficient documentation

## 2016-10-21 DIAGNOSIS — E1140 Type 2 diabetes mellitus with diabetic neuropathy, unspecified: Secondary | ICD-10-CM | POA: Diagnosis not present

## 2016-10-21 DIAGNOSIS — R079 Chest pain, unspecified: Secondary | ICD-10-CM | POA: Diagnosis not present

## 2016-10-21 HISTORY — DX: Unspecified asthma, uncomplicated: J45.909

## 2016-10-21 MED ORDER — ACETAMINOPHEN 500 MG PO TABS
1000.0000 mg | ORAL_TABLET | Freq: Once | ORAL | Status: AC
  Administered 2016-10-21: 1000 mg via ORAL
  Filled 2016-10-21: qty 2

## 2016-10-21 NOTE — ED Provider Notes (Signed)
On  [place] DEPT Provider Note   CSN: [id number] Arrival date & time: 10/21/16  1505     History   Chief Complaint Chief Complaint  Patient presents with  . Chest Pain    HPI Tamara Henry is a 56 y.o. female.  HPI Pt arrives via [facility] for chest pain s/p a fall out of bed last night. Pt states she was sleeping and fell out of bed. Pt has a productive cough and the chest pain worsens when she coughs. Pt has hx of MI with quadruple bypass in Nov 2017.  Patient states the pain began immediately after the fall. A follow-up was from her bed. She was sleeping and had a treatment and fell. She fell on her back. She does not have any back pain but did have some anterior chest pain that is sharp. It is reproducible. Not worse with exertion, does not radiate. No diaphoresis, does not feel like prior MI. No shortness of breath.  Patient has had cough for several days but no fevers. She states that she is spitting up clear sputum without blood.   She does have a history of pulmonary embolism but is on Zaroxolyn has not missed any doses. Recent ultrasound of the legs without DVT.  No tearing quality to her pain.  Recent echo done with stress test which was negative in January. Review of records show the patient has an ejection fraction of 55-60%.   Past Medical History:  Diagnosis Date  . Acid reflux   . Asthma   . Back pain   . Bilateral pulmonary embolism ([place]) 05/2016  . CAD in native artery    a. s/p CABGx4 07/28/16.  . Cataracts, bilateral   . CKD (chronic kidney disease), stage III    borderline CKD II-III  . Depression   . Diabetes mellitus ([place])   . GERD (gastroesophageal reflux disease)   . Glaucoma   . High cholesterol   . 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 Hx of chest tube placement right   . Hypertension   . MI (myocardial infarction) 2017  . Morbid obesity ([name])   . Neuropathy ([facility])    hands and feet  . Pneumonia   . Postoperative anemia 07/2016  . Stroke [facility]) 2005      Patient Active Problem List   Diagnosis Date Noted  . Poor social situation 10/13/2016  . Anemia 08/30/2016  . Leg pain 08/30/2016  . Bilateral low back pain without sciatica 08/30/2016  . 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 Bilateral pulmonary embolism ([place]) 05/17/2016  . Hyperlipidemia 05/17/2016  . Depression 05/17/2016  . Diabetic peripheral neuropathy ([place]) 05/17/2016  . Neuritis of upper extremity, C7 01/15/2015  . DM type 2, uncontrolled, with renal complications ([place]) [id number]  . Hypertension 01/14/2015  . Numbness and tingling of right arm 01/14/2015    Past Surgical History:  Procedure Laterality Date  . CARDIAC CATHETERIZATION N/A 07/25/2016   Procedure: Left Heart Cath and Coronary Angiography;  Surgeon: Peter M Martinique, MD;  Location: [place] CV LAB;  Service: Cardiovascular;  Laterality: N/A;  . CORONARY ARTERY BYPASS GRAFT N/A 07/28/2016   Procedure: CORONARY ARTERY BYPASS GRAFTING (CABG)x4 using left internal mammary and endoscopic harvest of right greater saphenous vein;  Surgeon: Ivin Poot, MD;  Location: [facility];  Service: Open Heart Surgery;  Laterality: N/A;  . TEE WITHOUT CARDIOVERSION N/A 07/28/2016   Procedure: TRANSESOPHAGEAL ECHOCARDIOGRAM (TEE);  Surgeon: Ivin Poot, MD;  Location: [facility];  Service: Open Heart Surgery;  Laterality: N/A;  . TUBAL LIGATION      OB History    Gravida Para Term Preterm AB Living   5 4 4   1 4    SAB TAB Ectopic Multiple Live Births     1             Home Medications    Prior to Admission medications   Medication Sig Start Date End Date Taking? Authorizing Provider  acetaminophen-codeine (TYLENOL #3) 300-30 MG tablet Take 1 tablet by mouth every 6 (six) hours as needed for moderate pain. Patient not taking: Reported on 10/10/2016 09/06/16   Steve Rattler, DO  amiodarone (PACERONE) 200 MG tablet Take 1 tablet (200 mg total) by mouth daily. 10/10/16   Olivia Canter Sam, PA-C  amLODipine (NORVASC) 10 MG tablet Take  10 mg by mouth daily.    Historical Provider, MD  aspirin EC 325 MG EC tablet Take 1 tablet (325 mg total) by mouth daily. 08/08/16   Erin R Barrett, PA-C  atorvastatin (LIPITOR) 80 MG tablet Take 1 tablet (80 mg total) by mouth every evening. 08/17/16 11/15/16  Dayna N Dunn, PA-C  benzonatate (TESSALON) 100 MG capsule Take 1 capsule (100 mg total) by mouth 3 (three) times daily as needed for cough. Patient not taking: Reported on 10/09/2016 09/06/16   Steve Rattler, DO  ferrous sulfate 325 (65 FE) MG tablet Take 1 tablet (325 mg total) by mouth daily with breakfast. 08/22/16   Carlyle Dolly, MD  furosemide (LASIX) 40 MG tablet Take 1 tablet (40 mg total) by mouth daily. 08/07/16   Erin R Barrett, PA-C  gabapentin (NEURONTIN) 300 MG capsule Take 2 capsules (600 mg total) by mouth 3 (three) times daily. 10/13/16   Doyline N Rumley, DO  guaiFENesin (ROBITUSSIN) 100 MG/5ML SOLN Take 5 mLs (100 mg total) by mouth every 4 (four) hours as needed for cough or to loosen phlegm. 09/06/16   Steve Rattler, DO  insulin aspart (NOVOLOG) 100 UNIT/ML injection Inject 5 Units into the skin 2 (two) times daily before a meal.     Historical Provider, MD  insulin glargine (LANTUS) 100 UNIT/ML injection Inject 0.35 mLs (35 Units total) into the skin at bedtime. 08/30/16   Carlyle Dolly, MD  levofloxacin (LEVAQUIN) 500 MG tablet Take 1 tablet (500 mg total) by mouth daily. 10/09/16   Donielle Liston Alba, PA-C  MELATONIN PO Take 1 tablet by mouth at bedtime.     Historical Provider, MD  methocarbamol (ROBAXIN) 750 MG tablet Take 2 tablets (1,500 mg total) by mouth 2 (two) times daily as needed for muscle spasms. 08/30/16   Carlyle Dolly, MD  metoprolol (LOPRESSOR) 25 MG tablet Take 1 tablet (25 mg total) by mouth 2 (two) times daily. 10/10/16   Olivia Canter Sam, PA-C  Multiple Vitamins-Minerals (MULTIVITAMIN) tablet Take 1 tablet by mouth daily. 08/22/16   Carlyle Dolly, MD  omeprazole (PRILOSEC) 40 MG  capsule Take 40 mg by mouth daily. 03/16/16   Historical Provider, MD  PARoxetine (PAXIL) 40 MG tablet Take 40 mg by mouth daily with breakfast.    Historical Provider, MD  potassium chloride 20 MEQ TBCR Take 10 mEq by mouth daily. 08/07/16   Erin R Barrett, PA-C  Probiotic Product (ALIGN) 4 MG CAPS Take 1 capsule (4 mg total) by mouth daily. Patient not taking: Reported on 09/13/2016 06/03/16   Nehemiah Settle, NP  rivaroxaban (XARELTO) 20 MG TABS  tablet Take 1 tablet (20 mg total) by mouth every morning. 10/13/16   Lorna Few, DO    Family History Family History  Problem Relation Age of Onset  . Pancreatitis Mother   . Diabetes type II Mother   . CAD Father   . Diabetes type II Father   . Diabetes type II Sister   . Diabetes type II Brother     Social History Social History  Substance Use Topics  . Smoking status: Former Smoker    Quit date: 09/11/2005  . Smokeless tobacco: Never Used  . Alcohol use No     Allergies   Lactose intolerance (gi); Lisinopril; Reglan [metoclopramide]; Wellbutrin [bupropion]; Latex; Metformin and related; Penicillins; Sulfa antibiotics; and Tape   Review of Systems Review of Systems  Constitutional: Negative for fever.  Allergic/Immunologic: Negative for immunocompromised state.  All other systems reviewed and are negative.    Physical Exam Updated Vital Signs BP 132/70 (BP Location: Right Arm)   Pulse 69   Temp 98.5 F (36.9 C) (Oral)   Resp 20   Ht 5\' 4"  (1.626 m)   Wt 103.9 kg   SpO2 98%   BMI 39.31 kg/m   Physical Exam  Constitutional: She appears well-developed and well-nourished. No distress.  HENT:  Head: Normocephalic and atraumatic.  Eyes: Conjunctivae are normal. Right eye exhibits no discharge. Left eye exhibits no discharge.  Neck: Normal range of motion. Neck supple. No JVD present.  Cardiovascular: Normal rate, regular rhythm, normal heart sounds and intact distal pulses.   No murmur heard. Pulmonary/Chest:  Effort normal and breath sounds normal. No respiratory distress. She has no wheezes. She has no rales. She exhibits tenderness (anterior chest tenderness).  Abdominal: Soft. Bowel sounds are normal. She exhibits no distension and no mass. There is no tenderness. There is no rebound and no guarding. No hernia.  Musculoskeletal: She exhibits no edema (of LEs, no calf tenderness).  No c/t/l spine tenderness  Neurological: She is alert.  Skin: Skin is warm. No rash noted.  No bruising, swelling or deformity noted  Psychiatric: She has a normal mood and affect.  Nursing note and vitals reviewed.    ED Treatments / Results  Labs (all labs ordered are listed, but only abnormal results are displayed) Labs Reviewed - No data to display  EKG  EKG Interpretation  Date/Time:  Saturday October 21 2016 15:13:09 EST Ventricular Rate:  72 PR Interval:    QRS Duration: 100 QT Interval:  500 QTC Calculation: 548 R Axis:   -16 Text Interpretation:  Sinus rhythm Borderline left axis deviation Nonspecific T abnrm, anterolateral leads Prolonged QT interval Baseline wander in lead(s) V6 Confirmed by Hazle Coca [phone]) on 10/21/2016 3:16:07 PM       Radiology Dg Chest 2 View  Result Date: 10/21/2016 CLINICAL DATA:  Chest pain. EXAM: CHEST  2 VIEW COMPARISON:  09/13/2016 FINDINGS: Prior CABG. Heart and mediastinal contours are within normal limits. No focal opacities or effusions. No acute bony abnormality. IMPRESSION: No active cardiopulmonary disease. Electronically Signed   By: Rolm Baptise M.D.   On: 10/21/2016 16:03    Procedures Procedures (including critical care time)  Medications Ordered in ED Medications  acetaminophen (TYLENOL) tablet 1,000 mg (1,000 mg Oral Given 10/21/16 1619)     Initial Impression / Assessment and Plan / ED Course  I have reviewed the triage vital signs and the nursing notes.  Pertinent labs & imaging results that were available during my care of  the patient  were reviewed by me and considered in my medical decision making (see chart for details).     Symptoms are most consistent with a muscle/skeletal etiology. Chest x-ray obtained to rule out a fracture or pneumothorax which was negative. Do not suspect any significant intra-thoracic injury as the mechanism with minor and exam is reassuring. Vital signs are reassuring, no significant hypertension to suggest dissection. No SP-spine tenderness in the back to suggest a fracture. Patient does not appear volume overloaded and is not short of breath. Doubt CHF exacerbation. Patient is compliant with her Xarelto, less likely PE and symptoms are atypical for such. Patient does not describe angina symptoms and pain started after the trauma. Suspect that this is not ACS. EKG not significantly changed from prior to suggest ACS. However, patient's QTc was noted to be slightly more prolonged than usual. Review of records show the patient was recently started on Levaquin. Patient is no longer on this medication was instructed to always notify her providers when starting antibiotics of her prolonged QT. I answered the patient to follow-up with her primary care provider for repeat EKG next week. Strict return precautions given. Patient instructed to take Tylenol as needed. Will follow up with her primary care provider; discharged in good condition.   Final Clinical Impressions(s) / ED Diagnoses   Final diagnoses:  Chest pain, unspecified type    New Prescriptions Discharge Medication List as of 10/21/2016  4:09 PM       Karma Greaser, MD 10/22/16 0037    Quintella Reichert, MD 10/25/16 [phone]

## 2016-10-21 NOTE — ED Triage Notes (Signed)
Pt arrives via [facility] for chest pain s/p a fall out of bed last night. Pt states she was sleeping and fell out of bed. Pt has a productive cough and the chest pain worsens when she coughs. Pt has hx of MI with quadruple bypass in Nov 2017.

## 2016-10-21 NOTE — Telephone Encounter (Signed)
Patient fell out of bed this morning and complains of chest and left arm pain.  Instructed her to go to the ED for evaluation  Remo Lipps C. Roxan Hockey, MD [facility] [phone]

## 2016-10-23 ENCOUNTER — Ambulatory Visit (INDEPENDENT_AMBULATORY_CARE_PROVIDER_SITE_OTHER): Payer: Self-pay | Admitting: Surgical

## 2016-10-23 VITALS — BP 140/80 | HR 80 | Resp 20 | Ht 64.0 in | Wt 229.0 lb

## 2016-10-23 DIAGNOSIS — Z951 Presence of aortocoronary bypass graft: Secondary | ICD-10-CM

## 2016-10-23 DIAGNOSIS — Z4889 Encounter for other specified surgical aftercare: Secondary | ICD-10-CM

## 2016-10-23 DIAGNOSIS — I2510 Atherosclerotic heart disease of native coronary artery without angina pectoris: Secondary | ICD-10-CM

## 2016-10-23 NOTE — Patient Instructions (Signed)
Routine care.

## 2016-10-23 NOTE — Progress Notes (Signed)
[address]       [place] [address]             [phone]                  Jessicamarie Provencio [facility] Medical Record [id number] Date of Birth: 1961/06/12  Referring IV:[id number], Eber Hong, MD Primary Cardiology: Primary Care:Christina Lia Foyer, MD  Chief Complaint:  Follow Up Visit  DATE OF PROCEDURE: DATE OF DISCHARGE:                              OPERATIVE REPORT   OPERATION: 1. Coronary artery bypass grafting x4 (left internal mammary artery to     left anterior descending, saphenous vein graft to diagonal,     saphenous vein graft to obtuse marginal 1, saphenous vein graft to     distal circumflex). 2. Endoscopic harvest of right leg greater saphenous vein.  SURGEON:  Ivin Poot, MD.  ASSISTANT:  Nicholes Rough, PA-C.  PREOPERATIVE DIAGNOSES:  Severe 3-vessel coronary artery disease, unstable angina, and non-ST-elevation myocardial infarction.  POSTOPERATIVE DIAGNOSES:  Severe 3-vessel coronary artery disease, unstable angina, and non-ST-elevation myocardial infarction.  ANESTHESIA:  General by Crissie Sickles Conrad Buras, M.D.    History of Present Illness:    The patient is a 56 year old female who underwent the above procedure last November. She is seen in the office today to recheck her [facility] site per Dr. Lucianne Lei Trigt's instructions. She denies any current difficulties related to it including pain. 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 The symptom is relatively mild. It is primarily numbness.    Zubrod Score: At the time of surgery this patient's most appropriate activity status/level should be described as: []     0    Normal activity, no symptoms []     1    Restricted in physical strenuous activity but ambulatory, able to do out light work []     2    Ambulatory and capable of self care, unable to do work activities, up and about                 >50 % of waking hours                                                                                    []     3    Only limited self care, in bed greater than 50% of waking hours []     4    Completely disabled, no self care, confined to bed or chair []     5    Moribund  History  Smoking Status  . Former Smoker  . Quit date: 09/11/2005  Smokeless Tobacco  . Never Used       Allergies  Allergen Reactions  . Lactose Intolerance (Gi) Diarrhea  . Lisinopril Other (See Comments) and Cough    Inflammation, coughing  . Reglan [Metoclopramide] Other (See Comments)    REACTION IS SIDE EFFECT Pt stated having lock jaw as a side effect  . Wellbutrin [  Bupropion] Nausea And Vomiting and Cough  . Latex Rash  . Metformin And Related Diarrhea  . Penicillins Rash    [FROM PREVIOUS ENTRY-BEFORE 07/27/16] >>"ALL CILLINS"  Has patient had a PCN reaction causing immediate rash, facial/tongue/throat swelling, SOB or lightheadedness with hypotension: Yes Has patient had a PCN reaction causing severe rash involving mucus membranes or skin necrosis: No Has patient had a PCN reaction that required hospitalization No Has patient had a PCN reaction occurring within the last 10 years: Yes If all of the above answers are "NO", then may proceed with Cephalosporin use.   . Sulfa Antibiotics Itching  . Tape Rash    Current Outpatient Prescriptions  Medication Sig Dispense Refill  . acetaminophen-codeine (TYLENOL #3) 300-30 MG tablet Take 1 tablet by mouth every 6 (six) hours as needed for moderate pain. 20 tablet 0  . amiodarone (PACERONE) 200 MG tablet Take 1 tablet (200 mg total) by mouth daily. 30 tablet 0  . amLODipine (NORVASC) 10 MG tablet Take 10 mg by mouth daily.    Marland Kitchen aspirin EC 325 MG EC tablet Take 1 tablet (325 mg total) by mouth daily. 30 tablet 0  . atorvastatin (LIPITOR) 80 MG tablet Take 1 tablet (80 mg total) by mouth every evening. 90 tablet 3  . benzonatate (TESSALON) 100 MG capsule Take 1 capsule (100 mg total) by mouth 3  (three) times daily as needed for cough. 30 capsule 0  . ferrous sulfate 325 (65 FE) MG tablet Take 1 tablet (325 mg total) by mouth daily with breakfast. 30 tablet 3  . furosemide (LASIX) 40 MG tablet Take 1 tablet (40 mg total) by mouth daily. 30 tablet 0  . gabapentin (NEURONTIN) 300 MG capsule Take 2 capsules (600 mg total) by mouth 3 (three) times daily. 180 capsule 3  . guaiFENesin (ROBITUSSIN) 100 MG/5ML SOLN Take 5 mLs (100 mg total) by mouth every 4 (four) hours as needed for cough or to loosen phlegm. 1200 mL 0  . insulin aspart (NOVOLOG) 100 UNIT/ML injection Inject 5 Units into the skin 2 (two) times daily before a meal.     . insulin glargine (LANTUS) 100 UNIT/ML injection Inject 0.35 mLs (35 Units total) into the skin at bedtime. 10 mL 11  . MELATONIN PO Take 1 tablet by mouth at bedtime.     . methocarbamol (ROBAXIN) 750 MG tablet Take 2 tablets (1,500 mg total) by mouth 2 (two) times daily as needed for muscle spasms. 30 tablet 0  . metoprolol (LOPRESSOR) 25 MG tablet Take 1 tablet (25 mg total) by mouth 2 (two) times daily. 60 tablet 0  . Multiple Vitamins-Minerals (MULTIVITAMIN) tablet Take 1 tablet by mouth daily. 30 tablet 5  . omeprazole (PRILOSEC) 40 MG capsule Take 40 mg by mouth daily.    Marland Kitchen PARoxetine (PAXIL) 40 MG tablet Take 40 mg by mouth daily with breakfast.    . potassium chloride 20 MEQ TBCR Take 10 mEq by mouth daily. 30 tablet 0  . Probiotic Product (ALIGN) 4 MG CAPS Take 1 capsule (4 mg total) by mouth daily. 30 capsule 2  . rivaroxaban (XARELTO) 20 MG TABS tablet Take 1 tablet (20 mg total) by mouth every morning. 90 tablet 0   No current facility-administered medications for this visit.        Physical Exam: BP 140/80   Pulse 80   Resp 20   Ht 5\' 4"  (1.626 m)   Wt 229 lb (103.9 kg)   SpO2 98%  Comment: RA  BMI 39.31 kg/m   Wounds:EVH site is healing well without evidence of infection.  Diagnostic Studies & Laboratory data:         Recent  Radiology Findings: Dg Chest 2 View  Result Date: 10/21/2016 CLINICAL DATA:  Chest pain. EXAM: CHEST  2 VIEW COMPARISON:  09/13/2016 FINDINGS: Prior CABG. Heart and mediastinal contours are within normal limits. No focal opacities or effusions. No acute bony abnormality. IMPRESSION: No active cardiopulmonary disease. Electronically Signed   By: Rolm Baptise M.D.   On: 10/21/2016 16:03      I have independently reviewed the above radiology findings and reviewed findings  with the patient.  Recent Labs: Lab Results  Component Value Date   WBC 8.3 08/17/2016   HGB 9.9 (L) 08/17/2016   HCT 31.3 (L) 08/17/2016   PLT 413 (H) 08/17/2016   GLUCOSE 310 (H) 10/10/2016   CHOL 256 (H) 10/10/2016   TRIG 278 (H) 10/10/2016   HDL 58 10/10/2016   LDLCALC 142 (H) 10/10/2016   ALT 17 10/10/2016   AST 15 10/10/2016   NA 138 10/10/2016   K 4.5 10/10/2016   CL 96 10/10/2016   CREATININE 1.34 (H) 10/10/2016   BUN 23 10/10/2016   CO2 20 10/10/2016   TSH 0.197 (L) 07/26/2016   INR 1.09 08/01/2016   HGBA1C 9.2 (H) 07/26/2016      Assessment / Plan:  EVH wound site check with good healing. We'll see again when necessary.        Nancy Arvin E 10/23/2016 2:43 PM

## 2016-10-24 DIAGNOSIS — E1142 Type 2 diabetes mellitus with diabetic polyneuropathy: Secondary | ICD-10-CM | POA: Diagnosis not present

## 2016-10-24 DIAGNOSIS — E1122 Type 2 diabetes mellitus with diabetic chronic kidney disease: Secondary | ICD-10-CM | POA: Diagnosis not present

## 2016-10-24 DIAGNOSIS — I2510 Atherosclerotic heart disease of native coronary artery without angina pectoris: Secondary | ICD-10-CM | POA: Diagnosis not present

## 2016-10-24 DIAGNOSIS — Z48812 Encounter for surgical aftercare following surgery on the circulatory system: Secondary | ICD-10-CM | POA: Diagnosis not present

## 2016-10-24 DIAGNOSIS — I129 Hypertensive chronic kidney disease with stage 1 through stage 4 chronic kidney disease, or unspecified chronic kidney disease: Secondary | ICD-10-CM | POA: Diagnosis not present

## 2016-10-24 DIAGNOSIS — I2782 Chronic pulmonary embolism: Secondary | ICD-10-CM | POA: Diagnosis not present

## 2016-10-26 ENCOUNTER — Encounter: Payer: Self-pay | Admitting: Cardiology

## 2016-10-26 ENCOUNTER — Ambulatory Visit (INDEPENDENT_AMBULATORY_CARE_PROVIDER_SITE_OTHER): Payer: Medicare Other | Admitting: Cardiology

## 2016-10-26 ENCOUNTER — Ambulatory Visit: Payer: Self-pay

## 2016-10-26 DIAGNOSIS — I10 Essential (primary) hypertension: Secondary | ICD-10-CM

## 2016-10-26 DIAGNOSIS — I2510 Atherosclerotic heart disease of native coronary artery without angina pectoris: Secondary | ICD-10-CM | POA: Diagnosis not present

## 2016-10-26 DIAGNOSIS — Z87898 Personal history of other specified conditions: Secondary | ICD-10-CM | POA: Diagnosis not present

## 2016-10-26 DIAGNOSIS — N2581 Secondary hyperparathyroidism of renal origin: Secondary | ICD-10-CM | POA: Diagnosis not present

## 2016-10-26 DIAGNOSIS — I129 Hypertensive chronic kidney disease with stage 1 through stage 4 chronic kidney disease, or unspecified chronic kidney disease: Secondary | ICD-10-CM | POA: Diagnosis not present

## 2016-10-26 DIAGNOSIS — E7800 Pure hypercholesterolemia, unspecified: Secondary | ICD-10-CM | POA: Diagnosis not present

## 2016-10-26 DIAGNOSIS — N183 Chronic kidney disease, stage 3 (moderate): Secondary | ICD-10-CM | POA: Diagnosis not present

## 2016-10-26 DIAGNOSIS — E669 Obesity, unspecified: Secondary | ICD-10-CM | POA: Diagnosis not present

## 2016-10-26 DIAGNOSIS — D631 Anemia in chronic kidney disease: Secondary | ICD-10-CM | POA: Diagnosis not present

## 2016-10-26 MED ORDER — ASPIRIN 81 MG PO TBEC: 81.0000 mg | DELAYED_RELEASE_TABLET | Freq: Every day | ORAL | Status: DC

## 2016-10-26 NOTE — Patient Instructions (Signed)
Medication Instructions:  1) STOP AMIODARONE 2) DECREASE ASPIRIN to 81 mg daily  Labwork: FASTING labs in 6 weeks: BMET, liver, lipids  Testing/Procedures: None  Follow-Up: Your physician wants you to follow-up in: 6 months with Dr. Theodosia Blender assistant. You will receive a reminder letter in the mail two months in advance. If you don't receive a letter, please call our office to schedule the follow-up appointment.   Your physician wants you to follow-up in: 1 year with Dr. Radford Pax. You will receive a reminder letter in the mail two months in advance. If you don't receive a letter, please call our office to schedule the follow-up appointment.   Any Other Special Instructions Will Be Listed Below (If Applicable). Cardiac Rehab: [phone]    If you need a refill on your cardiac medications before your next appointment, please call your pharmacy.

## 2016-10-26 NOTE — Progress Notes (Signed)
Cardiology Office Note    Date:  10/26/2016   ID:  Tamara Henry, DOB 1961-02-12, MRN [id number]  PCP:  Carlyle Dolly, MD  Cardiologist:  Fransico Him, MD   Chief Complaint  Patient presents with  . Coronary Artery Disease  . Hypertension  . Hyperlipidemia    History of Present Illness:  Tamara Henry is a 56 y.o. female with a PMH significant for morbid obesity, IDDM, HTN, dyslipidemia, coronary calcifications on CT, GERD, and recently diagnosed bilateral PEs who presented with acute onset chest pain on 07/23/2016.  She ruled in for NSTEMI and underwent cath showing severe 3 vessel CAD and underwent CABG.  She also has a history of bilateral pulmonary emboli and is on Xarelto.  She has a history of hyperlipidemia, CKD stage III.  She presents today and is doing well.  She has had some problems with cough and chest congestion and mild LE edema.  She denies any anginal chest pain, dizziness, palpitations or syncope.  She has had some mild SOB with her cough.    Past Medical History:  Diagnosis Date  . Acid reflux   . Asthma   . Back pain   . Bilateral pulmonary embolism ([place]) 05/2016  . CAD in native artery    S/P NSTEMI with cath showing severe 3 vessel ASCAD s/p CABGx4 07/28/16.  . Cataracts, bilateral   . CKD (chronic kidney disease), stage III    borderline CKD II-III  . Depression   . Diabetes mellitus ([place])   . GERD (gastroesophageal reflux disease)   . Glaucoma   . High cholesterol   . 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 Hx of chest tube placement right   . Hypertension   . Morbid obesity ([place])   . Neuropathy (HCC)    hands and feet  . Pneumonia   . Postoperative anemia 07/2016  . Stroke [facility]) 2005    Past Surgical History:  Procedure Laterality Date  . CARDIAC CATHETERIZATION N/A 07/25/2016   Procedure: Left Heart Cath and Coronary Angiography;  Surgeon: Peter M Martinique, MD;  Location: [place] [facility];  Service: Cardiovascular;  Laterality: N/A;  . CORONARY ARTERY  BYPASS GRAFT N/A 07/28/2016   Procedure: CORONARY ARTERY BYPASS GRAFTING (CABG)x4 using left internal mammary and endoscopic harvest of right greater saphenous vein;  Surgeon: Ivin Poot, MD;  Location: [place];  Service: Open Heart Surgery;  Laterality: N/A;  . TEE WITHOUT CARDIOVERSION N/A 07/28/2016   Procedure: TRANSESOPHAGEAL ECHOCARDIOGRAM (TEE);  Surgeon: Ivin Poot, MD;  Location: [place];  Service: Open Heart Surgery;  Laterality: N/A;  . TUBAL LIGATION      Current Medications: Current Meds  Medication Sig  . acetaminophen-codeine (TYLENOL #3) 300-30 MG tablet Take 1 tablet by mouth every 6 (six) hours as needed for moderate pain.  Marland Kitchen amiodarone (PACERONE) 200 MG tablet Take 1 tablet (200 mg total) by mouth daily.  Marland Kitchen amLODipine (NORVASC) 10 MG tablet Take 10 mg by mouth daily.  Marland Kitchen aspirin EC 325 MG EC tablet Take 1 tablet (325 mg total) by mouth daily.  Marland Kitchen atorvastatin (LIPITOR) 80 MG tablet Take 1 tablet (80 mg total) by mouth every evening.  . benzonatate (TESSALON) 100 MG capsule Take 1 capsule (100 mg total) by mouth 3 (three) times daily as needed for cough.  . ferrous sulfate 325 (65 FE) MG tablet Take 1 tablet (325 mg total) by mouth daily with breakfast.  . furosemide (LASIX) 40 MG tablet Take 1  tablet (40 mg total) by mouth daily.  Marland Kitchen gabapentin (NEURONTIN) 300 MG capsule Take 2 capsules (600 mg total) by mouth 3 (three) times daily.  Marland Kitchen guaiFENesin (ROBITUSSIN) 100 MG/5ML SOLN Take 5 mLs (100 mg total) by mouth every 4 (four) hours as needed for cough or to loosen phlegm.  . insulin aspart (NOVOLOG) 100 UNIT/ML injection Inject 5 Units into the skin 2 (two) times daily before a meal.   . insulin glargine (LANTUS) 100 UNIT/ML injection Inject 0.35 mLs (35 Units total) into the skin at bedtime.  Marland Kitchen MELATONIN PO Take 1 tablet by mouth at bedtime.   . methocarbamol (ROBAXIN) 750 MG tablet Take 2 tablets (1,500 mg total) by mouth 2 (two) times daily as needed for muscle spasms.    . metoprolol (LOPRESSOR) 25 MG tablet Take 1 tablet (25 mg total) by mouth 2 (two) times daily.  . Multiple Vitamins-Minerals (MULTIVITAMIN) tablet Take 1 tablet by mouth daily.  Marland Kitchen omeprazole (PRILOSEC) 40 MG capsule Take 40 mg by mouth daily.  Marland Kitchen PARoxetine (PAXIL) 40 MG tablet Take 40 mg by mouth daily with breakfast.  . potassium chloride 20 MEQ TBCR Take 10 mEq by mouth daily.  . Probiotic Product (ALIGN) 4 MG CAPS Take 1 capsule (4 mg total) by mouth daily.  . rivaroxaban (XARELTO) 20 MG TABS tablet Take 1 tablet (20 mg total) by mouth every morning.    Allergies:   Lactose intolerance (gi); Lisinopril; Reglan [metoclopramide]; Wellbutrin [bupropion]; Latex; Metformin and related; Penicillins; Sulfa antibiotics; and Tape   Social History   Social History  . Marital status: Married    Spouse name: N/A  . Number of children: 4  . Years of education: 12   Occupational History  . disabled    Social History Main Topics  . Smoking status: Former Smoker    Quit date: 09/11/2005  . Smokeless tobacco: Never Used  . Alcohol use No  . Drug use: No  . Sexual activity: Yes    Birth control/ protection: Surgical   Other Topics Concern  . None   Social History Narrative   Lives at home with daughter Arley [place]ix   Drinks no caffeine      Family History:  The patient's family history includes CAD in her father; Diabetes type II in her brother, father, mother, and sister; Pancreatitis in her mother.   ROS:   Please see the history of present illness.    ROS All other systems reviewed and are negative.  No flowsheet data found.     PHYSICAL EXAM:   VS:  BP 118/70   Pulse 82   Ht 5\' 4"  (1.626 m)   Wt 234 lb 12.8 oz (106.5 kg)   BMI 40.30 kg/m    GEN: Well nourished, well developed, in no acute distress  HEENT: normal  Neck: no JVD, carotid bruits, or masses Cardiac: RRR; no murmurs, rubs, or gallops,no edema.  Intact distal pulses bilaterally.  Respiratory:  clear to  auscultation bilaterally, normal work of breathing GI: soft, nontender, nondistended, + BS MS: no deformity or atrophy  Skin: warm and dry, no rash Neuro:  Alert and Oriented x 3, Strength and sensation are intact Psych: euthymic mood, full affect  Wt Readings from Last 3 Encounters:  10/26/16 234 lb 12.8 oz (106.5 kg)  10/23/16 229 lb (103.9 kg)  10/21/16 229 lb (103.9 kg)      Studies/Labs Reviewed:   EKG:  EKG is not ordered today.   Recent Labs: 07/26/2016:  TSH 0.197 07/29/2016: Magnesium 1.7 08/17/2016: Hemoglobin 9.9; Platelets 413 10/10/2016: ALT 17; BUN 23; Creatinine, Ser 1.34; Potassium 4.5; Sodium 138   Lipid Panel    Component Value Date/Time   CHOL 256 (H) 10/10/2016 1245   TRIG 278 (H) 10/10/2016 1245   HDL 58 10/10/2016 1245   CHOLHDL 4.4 10/10/2016 1245   CHOLHDL 6.5 07/23/2016 1210   VLDL 55 (H) 07/23/2016 1210   LDLCALC 142 (H) 10/10/2016 1245    Additional studies/ records that were reviewed today include:  Office notes and hospital notes    ASSESSMENT:    1. Coronary artery disease involving native coronary artery of native heart without angina pectoris   2. Essential hypertension   3. Pure hypercholesterolemia      PLAN:  In order of problems listed above:  1. ASCAD s/p NSTEMI with severe 3 vessel CAD by cath s/p CABG.  She has no anginal CP and is doing well.  She has not been participating in cardiac rehab yet.  I will call rehab to see why she has not been enrolled yet.  I will decrease her ASA to 81mg  daily since she is on Xarelto.  Continue statin and BB. 2. HTN - BP controlled on current meds.  She will continue on amlodipine and metoprolol. 3. Hyperlipidemia - LDL goal < 70. Her last LDL was 142 on 10/10/2016 and she had not been taking her lipitor but she is on it now.  She will continue on high dose statin and repeat FLP and ALT in 6 weeks.  4. Post op atrial fibrillation maintaining NSR on Amio.  Stop Amio since she is 3 months out  and has not had any palpitations.  5. Bilateral PEs on Xarelto.      Medication Adjustments/Labs and Tests Ordered: Current medicines are reviewed at length with the patient today.  Concerns regarding medicines are outlined above.  Medication changes, Labs and Tests ordered today are listed in the Patient Instructions below.  There are no Patient Instructions on file for this visit.   Signed, Fransico Him, MD  10/26/2016 10:24 AM    [facility] [address], [place], [place]  [address] Phone: [phone]; Fax: [phone]

## 2016-10-26 NOTE — Progress Notes (Deleted)
Patient ID: Tamara Henry                 DOB: 1961-04-12                    MRN: [id number]     HPI: Tamara Henry is a 56 y.o. female patient referred to lipid clinic by Tamara Copa, PA.  Current Medications:  Intolerances:  Risk Factors:  LDL goal:   Diet:   Exercise:   Family History:   Social History:   Labs: 10/10/16: TC 256, TG 278, HDL 58, LDL 142  Past Medical History:  Diagnosis Date  . Acid reflux   . Asthma   . Back pain   . Bilateral pulmonary embolism ([place]) 05/2016  . CAD in native artery    a. s/p CABGx4 07/28/16.  . Cataracts, bilateral   . CKD (chronic kidney disease), stage III    borderline CKD II-III  . Depression   . Diabetes mellitus ([place])   . GERD (gastroesophageal reflux disease)   . Glaucoma   . High cholesterol   . 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 Hx of chest tube placement right   . Hypertension   . MI (myocardial infarction) 2017  . Morbid obesity ([place])   . Neuropathy (HCC)    hands and feet  . Pneumonia   . Postoperative anemia 07/2016  . Stroke [facility]) 2005    Current Outpatient Prescriptions on File Prior to Visit  Medication Sig Dispense Refill  . acetaminophen-codeine (TYLENOL #3) 300-30 MG tablet Take 1 tablet by mouth every 6 (six) hours as needed for moderate pain. 20 tablet 0  . amiodarone (PACERONE) 200 MG tablet Take 1 tablet (200 mg total) by mouth daily. 30 tablet 0  . amLODipine (NORVASC) 10 MG tablet Take 10 mg by mouth daily.    Marland Kitchen aspirin EC 325 MG EC tablet Take 1 tablet (325 mg total) by mouth daily. 30 tablet 0  . atorvastatin (LIPITOR) 80 MG tablet Take 1 tablet (80 mg total) by mouth every evening. 90 tablet 3  . benzonatate (TESSALON) 100 MG capsule Take 1 capsule (100 mg total) by mouth 3 (three) times daily as needed for cough. 30 capsule 0  . ferrous sulfate 325 (65 FE) MG tablet Take 1 tablet (325 mg total) by mouth daily with breakfast. 30 tablet 3  . furosemide (LASIX) 40 MG tablet Take 1 tablet (40 mg total) by mouth  daily. 30 tablet 0  . gabapentin (NEURONTIN) 300 MG capsule Take 2 capsules (600 mg total) by mouth 3 (three) times daily. 180 capsule 3  . guaiFENesin (ROBITUSSIN) 100 MG/5ML SOLN Take 5 mLs (100 mg total) by mouth every 4 (four) hours as needed for cough or to loosen phlegm. 1200 mL 0  . insulin aspart (NOVOLOG) 100 UNIT/ML injection Inject 5 Units into the skin 2 (two) times daily before a meal.     . insulin glargine (LANTUS) 100 UNIT/ML injection Inject 0.35 mLs (35 Units total) into the skin at bedtime. 10 mL 11  . MELATONIN PO Take 1 tablet by mouth at bedtime.     . methocarbamol (ROBAXIN) 750 MG tablet Take 2 tablets (1,500 mg total) by mouth 2 (two) times daily as needed for muscle spasms. 30 tablet 0  . metoprolol (LOPRESSOR) 25 MG tablet Take 1 tablet (25 mg total) by mouth 2 (two) times daily. 60 tablet 0  . Multiple Vitamins-Minerals (MULTIVITAMIN) tablet Take 1 tablet by mouth daily. Vici  tablet 5  . omeprazole (PRILOSEC) 40 MG capsule Take 40 mg by mouth daily.    Marland Kitchen PARoxetine (PAXIL) 40 MG tablet Take 40 mg by mouth daily with breakfast.    . potassium chloride 20 MEQ TBCR Take 10 mEq by mouth daily. 30 tablet 0  . Probiotic Product (ALIGN) 4 MG CAPS Take 1 capsule (4 mg total) by mouth daily. 30 capsule 2  . rivaroxaban (XARELTO) 20 MG TABS tablet Take 1 tablet (20 mg total) by mouth every morning. 90 tablet 0   No current facility-administered medications on file prior to visit.     Allergies  Allergen Reactions  . Lactose Intolerance (Gi) Diarrhea  . Lisinopril Other (See Comments) and Cough    Inflammation, coughing  . Reglan [Metoclopramide] Other (See Comments)    REACTION IS SIDE EFFECT Pt stated having lock jaw as a side effect  . Wellbutrin [Bupropion] Nausea And Vomiting and Cough  . Latex Rash  . Metformin And Related Diarrhea  . Penicillins Rash    [FROM PREVIOUS ENTRY-BEFORE 07/27/16] >>"ALL CILLINS"  Has patient had a PCN reaction causing immediate rash,  facial/tongue/throat swelling, SOB or lightheadedness with hypotension: Yes Has patient had a PCN reaction causing severe rash involving mucus membranes or skin necrosis: No Has patient had a PCN reaction that required hospitalization No Has patient had a PCN reaction occurring within the last 10 years: Yes If all of the above answers are "NO", then may proceed with Cephalosporin use.   . Sulfa Antibiotics Itching  . Tape Rash    Assessment/Plan:

## 2016-10-30 ENCOUNTER — Encounter: Payer: Self-pay | Admitting: Family Medicine

## 2016-11-01 ENCOUNTER — Encounter: Payer: Self-pay | Admitting: Family Medicine

## 2016-11-01 ENCOUNTER — Ambulatory Visit (INDEPENDENT_AMBULATORY_CARE_PROVIDER_SITE_OTHER): Payer: Medicare Other | Admitting: Family Medicine

## 2016-11-01 VITALS — BP 130/78 | HR 94 | Temp 98.4°F | Ht 64.0 in | Wt 237.6 lb

## 2016-11-01 DIAGNOSIS — I2510 Atherosclerotic heart disease of native coronary artery without angina pectoris: Secondary | ICD-10-CM | POA: Diagnosis not present

## 2016-11-01 DIAGNOSIS — E1142 Type 2 diabetes mellitus with diabetic polyneuropathy: Secondary | ICD-10-CM | POA: Diagnosis not present

## 2016-11-01 DIAGNOSIS — Z9189 Other specified personal risk factors, not elsewhere classified: Secondary | ICD-10-CM | POA: Diagnosis not present

## 2016-11-01 DIAGNOSIS — E1165 Type 2 diabetes mellitus with hyperglycemia: Secondary | ICD-10-CM | POA: Diagnosis not present

## 2016-11-01 DIAGNOSIS — E1121 Type 2 diabetes mellitus with diabetic nephropathy: Secondary | ICD-10-CM | POA: Diagnosis present

## 2016-11-01 DIAGNOSIS — Z794 Long term (current) use of insulin: Secondary | ICD-10-CM | POA: Diagnosis not present

## 2016-11-01 DIAGNOSIS — F339 Major depressive disorder, recurrent, unspecified: Secondary | ICD-10-CM

## 2016-11-01 DIAGNOSIS — Z659 Problem related to unspecified psychosocial circumstances: Secondary | ICD-10-CM

## 2016-11-01 DIAGNOSIS — IMO0002 Reserved for concepts with insufficient information to code with codable children: Secondary | ICD-10-CM

## 2016-11-01 LAB — POCT GLYCOSYLATED HEMOGLOBIN (HGB A1C): Hemoglobin A1C: 8.9

## 2016-11-01 MED ORDER — ATORVASTATIN CALCIUM 80 MG PO TABS: 80.0000 mg | ORAL_TABLET | Freq: Every evening | ORAL | 3 refills | Status: DC

## 2016-11-01 MED ORDER — BASAGLAR KWIKPEN 100 UNIT/ML ~~LOC~~ SOPN: 35.0000 [IU] | PEN_INJECTOR | Freq: Every day | SUBCUTANEOUS | 2 refills | Status: DC

## 2016-11-01 MED ORDER — GABAPENTIN 300 MG PO CAPS: ORAL_CAPSULE | ORAL | 3 refills | Status: DC
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
## 2016-11-01 NOTE — Assessment & Plan Note (Signed)
Uncontrolled symptoms. -We will try gabapentin 600 mg in the morning, at lunch, and 900 mg before bed -Follow-up in one week

## 2016-11-01 NOTE — Assessment & Plan Note (Signed)
Improving A1c of 8.9 today. Lantus will no longer be covered by her insurance. -Stop Lantus and switch to insulin glargine at the same dose of 35 units at bedtime -Continue NovoLog 5 mg twice daily with biggest meals of the day -Check A1c again in 3 months -Encouraged patient to check her glucose every day

## 2016-11-01 NOTE — Assessment & Plan Note (Signed)
No longer homeless as of yesterday she is living in an apartment with her daughter and grandchildren. Neoma Laming our Education officer, museum has been in contact with this patient her family and is provided numerous resources. Also discussed patient with Neoma Laming today while she was in clinic. We'll continue to monitor

## 2016-11-01 NOTE — Assessment & Plan Note (Signed)
Uncontrolled. Patient has significant history of depression. Currently on Paxil which she notes has helped in the past but recently given her life changes of being periodically homeless and status post CABG surgery her depression symptoms seem to worsening. Denies any suicidal or homicidal ideation. -Provided patient with information to contact behavioral health specialist, feels that she would benefit from some counseling -Follow-up in one week

## 2016-11-01 NOTE — Assessment & Plan Note (Signed)
Concerning symptoms for sleep apnea including periods of apnea during sleep as well as lab snoring and coughing. -Referral to pulmonology made, patient will likely need sleep study

## 2016-11-01 NOTE — Patient Instructions (Signed)
Thank you for coming in today, it was so nice to see you! Today we talked about:    Neuropathy: Please take 600 mg in the morning, 600 mg at lunch, 900 mg at bedtime   Diabetes: Take the other kind of insulin at night instead of the lantus  Mood: Please call the number I gave you to talk to a behavioral health specialist  Coughing in sleep: I have placed a referral to talk to a lung doctor for a sleep study  Please follow up in 1 week. You can schedule this appointment at the front desk before you leave or call the clinic.  Bring in all your medications or supplements to each appointment for review.   If we ordered any tests today, you will be notified via telephone of any abnormalities. If everything is normal you will get a letter in the mail.   If you have any questions or concerns, please do not hesitate to call the office at [phone]. You can also message me directly via MyChart.   Sincerely,  Smitty Cords, MD

## 2016-11-02 ENCOUNTER — Encounter: Payer: Self-pay | Admitting: Family Medicine

## 2016-11-02 DIAGNOSIS — I2510 Atherosclerotic heart disease of native coronary artery without angina pectoris: Secondary | ICD-10-CM | POA: Diagnosis not present

## 2016-11-02 DIAGNOSIS — I129 Hypertensive chronic kidney disease with stage 1 through stage 4 chronic kidney disease, or unspecified chronic kidney disease: Secondary | ICD-10-CM | POA: Diagnosis not present

## 2016-11-02 DIAGNOSIS — N183 Chronic kidney disease, stage 3 unspecified: Secondary | ICD-10-CM | POA: Insufficient documentation

## 2016-11-02 DIAGNOSIS — I2782 Chronic pulmonary embolism: Secondary | ICD-10-CM | POA: Diagnosis not present

## 2016-11-02 DIAGNOSIS — E1142 Type 2 diabetes mellitus with diabetic polyneuropathy: Secondary | ICD-10-CM | POA: Diagnosis not present

## 2016-11-02 DIAGNOSIS — E1122 Type 2 diabetes mellitus with diabetic chronic kidney disease: Secondary | ICD-10-CM | POA: Diagnosis not present

## 2016-11-02 DIAGNOSIS — Z48812 Encounter for surgical aftercare following surgery on the circulatory system: Secondary | ICD-10-CM | POA: Diagnosis not present

## 2016-11-04 DIAGNOSIS — F329 Major depressive disorder, single episode, unspecified: Secondary | ICD-10-CM | POA: Diagnosis not present

## 2016-11-04 DIAGNOSIS — Z794 Long term (current) use of insulin: Secondary | ICD-10-CM | POA: Diagnosis not present

## 2016-11-04 DIAGNOSIS — E1142 Type 2 diabetes mellitus with diabetic polyneuropathy: Secondary | ICD-10-CM | POA: Diagnosis not present

## 2016-11-04 DIAGNOSIS — H2513 Age-related nuclear cataract, bilateral: Secondary | ICD-10-CM | POA: Diagnosis not present

## 2016-11-04 DIAGNOSIS — Z955 Presence of coronary angioplasty implant and graft: Secondary | ICD-10-CM | POA: Diagnosis not present

## 2016-11-04 DIAGNOSIS — I252 Old myocardial infarction: Secondary | ICD-10-CM | POA: Diagnosis not present

## 2016-11-04 DIAGNOSIS — Z8673 Personal history of transient ischemic attack (TIA), and cerebral infarction without residual deficits: Secondary | ICD-10-CM | POA: Diagnosis not present

## 2016-11-04 DIAGNOSIS — N183 Chronic kidney disease, stage 3 (moderate): Secondary | ICD-10-CM | POA: Diagnosis not present

## 2016-11-04 DIAGNOSIS — E1122 Type 2 diabetes mellitus with diabetic chronic kidney disease: Secondary | ICD-10-CM | POA: Diagnosis not present

## 2016-11-04 DIAGNOSIS — I129 Hypertensive chronic kidney disease with stage 1 through stage 4 chronic kidney disease, or unspecified chronic kidney disease: Secondary | ICD-10-CM | POA: Diagnosis not present

## 2016-11-04 DIAGNOSIS — E785 Hyperlipidemia, unspecified: Secondary | ICD-10-CM | POA: Diagnosis not present

## 2016-11-04 DIAGNOSIS — R339 Retention of urine, unspecified: Secondary | ICD-10-CM | POA: Diagnosis not present

## 2016-11-04 DIAGNOSIS — I2510 Atherosclerotic heart disease of native coronary artery without angina pectoris: Secondary | ICD-10-CM | POA: Diagnosis not present

## 2016-11-04 DIAGNOSIS — K219 Gastro-esophageal reflux disease without esophagitis: Secondary | ICD-10-CM | POA: Diagnosis not present

## 2016-11-04 DIAGNOSIS — Z7901 Long term (current) use of anticoagulants: Secondary | ICD-10-CM | POA: Diagnosis not present

## 2016-11-04 DIAGNOSIS — H409 Unspecified glaucoma: Secondary | ICD-10-CM | POA: Diagnosis not present

## 2016-11-04 DIAGNOSIS — I2782 Chronic pulmonary embolism: Secondary | ICD-10-CM | POA: Diagnosis not present

## 2016-11-06 DIAGNOSIS — E113511 Type 2 diabetes mellitus with proliferative diabetic retinopathy with macular edema, right eye: Secondary | ICD-10-CM | POA: Diagnosis not present

## 2016-11-06 DIAGNOSIS — E113492 Type 2 diabetes mellitus with severe nonproliferative diabetic retinopathy without macular edema, left eye: Secondary | ICD-10-CM | POA: Diagnosis not present

## 2016-11-07 DIAGNOSIS — I129 Hypertensive chronic kidney disease with stage 1 through stage 4 chronic kidney disease, or unspecified chronic kidney disease: Secondary | ICD-10-CM | POA: Diagnosis not present

## 2016-11-07 DIAGNOSIS — N183 Chronic kidney disease, stage 3 (moderate): Secondary | ICD-10-CM | POA: Diagnosis not present

## 2016-11-07 DIAGNOSIS — E1122 Type 2 diabetes mellitus with diabetic chronic kidney disease: Secondary | ICD-10-CM | POA: Diagnosis not present

## 2016-11-07 DIAGNOSIS — E1142 Type 2 diabetes mellitus with diabetic polyneuropathy: Secondary | ICD-10-CM | POA: Diagnosis not present

## 2016-11-07 DIAGNOSIS — I2782 Chronic pulmonary embolism: Secondary | ICD-10-CM | POA: Diagnosis not present

## 2016-11-07 DIAGNOSIS — I2510 Atherosclerotic heart disease of native coronary artery without angina pectoris: Secondary | ICD-10-CM | POA: Diagnosis not present

## 2016-11-08 DIAGNOSIS — N183 Chronic kidney disease, stage 3 (moderate): Secondary | ICD-10-CM | POA: Diagnosis not present

## 2016-11-08 DIAGNOSIS — I2510 Atherosclerotic heart disease of native coronary artery without angina pectoris: Secondary | ICD-10-CM | POA: Diagnosis not present

## 2016-11-08 DIAGNOSIS — E1122 Type 2 diabetes mellitus with diabetic chronic kidney disease: Secondary | ICD-10-CM | POA: Diagnosis not present

## 2016-11-08 DIAGNOSIS — E1142 Type 2 diabetes mellitus with diabetic polyneuropathy: Secondary | ICD-10-CM | POA: Diagnosis not present

## 2016-11-08 DIAGNOSIS — I129 Hypertensive chronic kidney disease with stage 1 through stage 4 chronic kidney disease, or unspecified chronic kidney disease: Secondary | ICD-10-CM | POA: Diagnosis not present

## 2016-11-08 DIAGNOSIS — I2782 Chronic pulmonary embolism: Secondary | ICD-10-CM | POA: Diagnosis not present

## 2016-11-09 ENCOUNTER — Telehealth: Payer: Self-pay | Admitting: Family Medicine

## 2016-11-09 DIAGNOSIS — I2510 Atherosclerotic heart disease of native coronary artery without angina pectoris: Secondary | ICD-10-CM | POA: Diagnosis not present

## 2016-11-09 DIAGNOSIS — E1142 Type 2 diabetes mellitus with diabetic polyneuropathy: Secondary | ICD-10-CM | POA: Diagnosis not present

## 2016-11-09 DIAGNOSIS — I2782 Chronic pulmonary embolism: Secondary | ICD-10-CM | POA: Diagnosis not present

## 2016-11-09 DIAGNOSIS — N183 Chronic kidney disease, stage 3 (moderate): Secondary | ICD-10-CM | POA: Diagnosis not present

## 2016-11-09 DIAGNOSIS — E1122 Type 2 diabetes mellitus with diabetic chronic kidney disease: Secondary | ICD-10-CM | POA: Diagnosis not present

## 2016-11-09 DIAGNOSIS — E118 Type 2 diabetes mellitus with unspecified complications: Secondary | ICD-10-CM

## 2016-11-09 DIAGNOSIS — I129 Hypertensive chronic kidney disease with stage 1 through stage 4 chronic kidney disease, or unspecified chronic kidney disease: Secondary | ICD-10-CM | POA: Diagnosis not present

## 2016-11-09 DIAGNOSIS — Z794 Long term (current) use of insulin: Principal | ICD-10-CM

## 2016-11-09 NOTE — Telephone Encounter (Signed)
Pt would like a referral to see Dr. Celesta Gentile podiatrist for feet issues due to diabetes. ep

## 2016-11-10 ENCOUNTER — Other Ambulatory Visit: Payer: Self-pay | Admitting: Family Medicine

## 2016-11-10 NOTE — Telephone Encounter (Signed)
Referral placed.

## 2016-11-10 NOTE — Telephone Encounter (Signed)
Pt  calling to request refill of:  Name of Medication(s): robaxin,  tylenol 3 Last date of OV:11-01-16 Pharmacy:  [facility] Will route refill request to Clinic RN.  Discussed with patient policy to call pharmacy for future refills.  Also, discussed refills may take up to 48 hours to approve or deny.  Roseanna Rainbow

## 2016-11-13 MED ORDER — METHOCARBAMOL 750 MG PO TABS: 1500.0000 mg | ORAL_TABLET | Freq: Two times a day (BID) | ORAL | 0 refills | Status: DC | PRN

## 2016-11-13 NOTE — Telephone Encounter (Signed)
Refilled Robaxin. Patient also requesting Tylenol 3. [facility] team please call patient and inform her that I have refilled her Robaxin but cannot refill Tylenol 3 as this was a temporary medication given in December for sinus pain. If she is still having pain please have her schedule an appointment at our clinic to discuss further. Thank you.   Smitty Cords, MD [place], PGY-2

## 2016-11-14 ENCOUNTER — Telehealth: Payer: Self-pay | Admitting: *Deleted

## 2016-11-14 NOTE — Telephone Encounter (Signed)
Prior Authorization received from [facility] for [employer]. Formulary preferred is BACLOFEN TIER 2 or TIZANIDINE HCL TIER 2. Please advise.  Derl Barrow, RN

## 2016-11-16 ENCOUNTER — Encounter (HOSPITAL_COMMUNITY): Payer: Self-pay

## 2016-11-16 ENCOUNTER — Encounter (HOSPITAL_COMMUNITY)
Admission: RE | Admit: 2016-11-16 | Discharge: 2016-11-16 | Disposition: A | Discharge status: Home / Self Care | Payer: Medicare Other | Source: Ambulatory Visit | Attending: Cardiology | Admitting: Cardiology

## 2016-11-16 VITALS — BP 144/78 | HR 84 | Ht 65.75 in | Wt 236.1 lb

## 2016-11-16 DIAGNOSIS — Z951 Presence of aortocoronary bypass graft: Secondary | ICD-10-CM | POA: Diagnosis not present

## 2016-11-16 DIAGNOSIS — I214 Non-ST elevation (NSTEMI) myocardial infarction: Secondary | ICD-10-CM

## 2016-11-16 NOTE — Telephone Encounter (Signed)
Contacted pt and informed her of below and she stated that she has an appointment tomorrow. Katharina Caper, Laurita Peron D, [place]

## 2016-11-16 NOTE — Progress Notes (Signed)
Cardiac Individual Treatment Plan  Patient Details  Name: Tamara Henry MRN: [id number] Date of Birth: 01/01/1961 Referring Provider:   Flowsheet Row CARDIAC REHAB PHASE II ORIENTATION from 11/16/2016 in [place]  Referring Provider  Fransico Him MD      Initial Encounter Date:  [place] PHASE II ORIENTATION from 11/16/2016 in [place]  Date  11/16/16  Referring Provider  Fransico Him MD      Visit Diagnosis: 07/28/16 S/P CABG x 4  07/23/16 NSTEMI (non-ST elevated myocardial infarction) [facility])  Patient's Home Medications on Admission:  Current Outpatient Prescriptions:  .  amLODipine (NORVASC) 10 MG tablet, Take 10 mg by mouth daily., Disp: , Rfl:  .  aspirin 81 MG EC tablet, Take 1 tablet (81 mg total) by mouth daily., Disp: , Rfl:  .  atorvastatin (LIPITOR) 80 MG tablet, Take 1 tablet (80 mg total) by mouth every evening., Disp: 90 tablet, Rfl: 3 .  ferrous sulfate 325 (65 FE) MG tablet, Take 1 tablet (325 mg total) by mouth daily with breakfast., Disp: 30 tablet, Rfl: 3 .  furosemide (LASIX) 40 MG tablet, Take 1 tablet (40 mg total) by mouth daily., Disp: 30 tablet, Rfl: 0 .  gabapentin (NEURONTIN) 300 MG capsule, Take 600mg  in the morning, 600 mg at lunch, and 900 mg at bedtime, Disp: 200 capsule, Rfl: 3 .  insulin aspart (NOVOLOG) 100 UNIT/ML injection, Inject 5 Units into the skin 2 (two) times daily before a meal. , Disp: , Rfl:  .  Insulin Glargine (BASAGLAR KWIKPEN) 100 UNIT/ML SOPN, Inject 0.35 mLs (35 Units total) into the skin at bedtime., Disp: 3 pen, Rfl: 2 .  loperamide (IMODIUM) 2 MG capsule, Take 2 mg by mouth daily as needed for diarrhea or loose stools., Disp: , Rfl:  .  MELATONIN PO, Take 1 tablet by mouth at bedtime. , Disp: , Rfl:  .  metoprolol (LOPRESSOR) 25 MG tablet, Take 1 tablet (25 mg total) by mouth 2 (two) times daily., Disp: 60 tablet, Rfl: 0 .  Multiple  Vitamins-Minerals (MULTIVITAMIN) tablet, Take 1 tablet by mouth daily., Disp: 30 tablet, Rfl: 5 .  omeprazole (PRILOSEC) 40 MG capsule, Take 40 mg by mouth daily., Disp: , Rfl:  .  PARoxetine (PAXIL) 40 MG tablet, Take 40 mg by mouth daily with breakfast., Disp: , Rfl:  .  potassium chloride 20 MEQ TBCR, Take 10 mEq by mouth daily., Disp: 30 tablet, Rfl: 0 .  Probiotic Product (ALIGN) 4 MG CAPS, Take 1 capsule (4 mg total) by mouth daily., Disp: 30 capsule, Rfl: 2 .  rivaroxaban (XARELTO) 20 MG TABS tablet, Take 1 tablet (20 mg total) by mouth every morning., Disp: 90 tablet, Rfl: 0 .  acetaminophen-codeine (TYLENOL #3) 300-30 MG tablet, Take 1 tablet by mouth every 6 (six) hours as needed for moderate pain. (Patient not taking: Reported on 11/16/2016), Disp: 20 tablet, Rfl: 0 .  methocarbamol (ROBAXIN) 750 MG tablet, Take 2 tablets (1,500 mg total) by mouth 2 (two) times daily as needed for muscle spasms. (Patient not taking: Reported on 11/16/2016), Disp: 30 tablet, Rfl: 0  Past Medical History: Past Medical History:  Diagnosis Date  . Acid reflux   . Asthma   . Back pain   . Bilateral pulmonary embolism ([place]) 05/2016  . CAD in native artery    S/P NSTEMI with cath showing severe 3 vessel ASCAD s/p CABGx4 07/28/16.  . Cataracts, bilateral   .  CKD (chronic kidney disease), stage III    borderline CKD II-III  . Cocaine abuse    In remission. Stopped using in early 2000's  . Depression   . Diabetes mellitus ([place])   . GERD (gastroesophageal reflux disease)   . Glaucoma   . High cholesterol   . 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 Hx of chest tube placement right   . Hypertension   . Morbid obesity ([place])   . Neuropathy ([facility])    hands and feet  . Pneumonia   . Postoperative anemia 07/2016  . Stroke [facility]) 2005    Tobacco Use: History  Smoking Status  . Former Smoker  . Quit date: 09/11/2005  Smokeless Tobacco  . Never Used    Labs: Recent Review Flowsheet Data    Labs for ITP Cardiac and  Pulmonary Rehab Latest Ref Rng & Units 07/29/2016 07/29/2016 07/31/2016 10/10/2016 11/01/2016   Cholestrol 100 - 199 mg/dL - - - 256(H) -   LDLCALC 0 - 99 mg/dL - - - 142(H) -   HDL >39 mg/dL - - - 58 -   Trlycerides 0 - 149 mg/dL - - - 278(H) -   Hemoglobin A1c - - - - - 8.9   PHART 7.350 - 7.450 7.379 - - - -   PCO2ART 32.0 - 48.0 mmHg 39.4 - - - -   HCO3 20.0 - 28.0 mmol/L 23.3 - - - -   TCO2 0 - 100 mmol/L 25 23 27  - -   ACIDBASEDEF 0.0 - 2.0 mmol/L 2.0 - - - -   O2SAT % 97.0 - - - -      Capillary Blood Glucose: Lab Results  Component Value Date   GLUCAP 124 (H) 08/10/2016   GLUCAP 162 (H) 08/09/2016   GLUCAP 130 (H) 08/07/2016   GLUCAP 143 (H) 08/07/2016   GLUCAP 170 (H) 08/07/2016     Exercise Target Goals: Date: 11/16/16  Exercise Program Goal: Individual exercise prescription set with THRR, safety & activity barriers. Participant demonstrates ability to understand and report RPE using BORG scale, to self-measure pulse accurately, and to acknowledge the importance of the exercise prescription.  Exercise Prescription Goal: Starting with aerobic activity 30 plus minutes a day, 3 days per week for initial exercise prescription. Provide home exercise prescription and guidelines that participant acknowledges understanding prior to discharge.  Activity Barriers & Risk Stratification:     Activity Barriers & Cardiac Risk Stratification - 11/16/16 0842      Activity Barriers & Cardiac Risk Stratification   Activity Barriers Other (comment);Deconditioning;Muscular Weakness   Comments L RTC injury from fall   Cardiac Risk Stratification High      6 Minute Walk:     6 Minute Walk    Row Name 11/16/16 1245         6 Minute Walk   Phase Initial     Distance 1385 feet     Walk Time 6 minutes     # of Rest Breaks 0     MPH 2.62     METS 3.47     RPE 13     VO2 Peak 12.16     Symptoms No     Resting HR 84 bpm     Resting BP 144/78     Max Ex. HR 108 bpm      Max Ex. BP 138/80     2 Minute Post BP 128/72        Oxygen Initial Assessment:  Oxygen Re-Evaluation:   Oxygen Discharge (Final Oxygen Re-Evaluation):   Initial Exercise Prescription:     Initial Exercise Prescription - 11/16/16 1200      Date of Initial Exercise RX and Referring Provider   Date 11/16/16   Referring Provider Fransico Him MD     Treadmill   MPH 2.3   Grade 0   Minutes 10   METs 2.76     Bike   Level 0.5   Minutes 10   METs 1.9     NuStep   Level 3   SPM 80   Minutes 10   METs 2     Prescription Details   Frequency (times per week) 3   Duration Progress to 30 minutes of continuous aerobic without signs/symptoms of physical distress     Intensity   THRR 40-80% of Max Heartrate 67-134   Ratings of Perceived Exertion 11-13   Perceived Dyspnea 0-4     Progression   Progression Continue to progress workloads to maintain intensity without signs/symptoms of physical distress.     Resistance Training   Training Prescription Yes   Weight 2lbs   Reps 10-15      Perform Capillary Blood Glucose checks as needed.  Exercise Prescription Changes:   Exercise Comments:   Exercise Goals and Review:     Exercise Goals    Row Name 11/16/16 [phone]             Exercise Goals   Increase Physical Activity Yes       Intervention Provide advice, education, support and counseling about physical activity/exercise needs.;Develop an individualized exercise prescription for aerobic and resistive training based on initial evaluation findings, risk stratification, comorbidities and participant's personal goals.       Expected Outcomes Achievement of increased cardiorespiratory fitness and enhanced flexibility, muscular endurance and strength shown through measurements of functional capacity and personal statement of participant.       Increase Strength and Stamina Yes       Intervention Provide advice, education, support and counseling about physical  activity/exercise needs.;Develop an individualized exercise prescription for aerobic and resistive training based on initial evaluation findings, risk stratification, comorbidities and participant's personal goals.       Expected Outcomes Achievement of increased cardiorespiratory fitness and enhanced flexibility, muscular endurance and strength shown through measurements of functional capacity and personal statement of participant.          Exercise Goals Re-Evaluation :    Discharge Exercise Prescription (Final Exercise Prescription Changes):   Nutrition:  Target Goals: Understanding of nutrition guidelines, daily intake of sodium 1500mg , cholesterol 200mg , calories 30% from fat and 7% or less from saturated fats, daily to have 5 or more servings of fruits and vegetables.  Biometrics:     Pre Biometrics - 11/16/16 1254      Pre Biometrics   Waist Circumference 47.75 inches   Hip Circumference 54.25 inches   Waist to Hip Ratio 0.88 %   Triceps Skinfold 45 mm   % Body Fat 50.5 %   Grip Strength 33 kg   Flexibility 8 in   Single Leg Stand 6.09 seconds       Nutrition Therapy Plan and Nutrition Goals:   Nutrition Discharge: Nutrition Scores:   Nutrition Goals Re-Evaluation:   Nutrition Goals Re-Evaluation:   Nutrition Goals Discharge (Final Nutrition Goals Re-Evaluation):   Psychosocial: Target Goals: Acknowledge presence or absence of significant depression and/or stress, maximize coping skills, provide positive support system. Participant is able  to verbalize types and ability to use techniques and skills needed for reducing stress and depression.  Initial Review & Psychosocial Screening:     Initial Psych Review & Screening - 11/16/16 1608      Initial Review   Current issues with None Identified     Family Dynamics   Good Support System? Yes   Comments --  Brief assessment reveals no indentafiable needs or interventions needed at this time      Barriers   Psychosocial barriers to participate in program The patient should benefit from training in stress management and relaxation.;There are no identifiable barriers or psychosocial needs.     Screening Interventions   Interventions Encouraged to exercise      Quality of Life Scores:     Quality of Life - 11/16/16 1255      Quality of Life Scores   Health/Function Pre 15.87 %   Socioeconomic Pre 10 %   Psych/Spiritual Pre 10.29 %   Family Post 7.5 %   GLOBAL Pre 12.5 %      PHQ-9: Recent Review Flowsheet Data    Depression screen [facility] 2/9 11/01/2016 10/13/2016 09/06/2016 08/30/2016 08/22/2016   Decreased Interest 3 3 0 1 3   Down, Depressed, Hopeless 3 3 0 1 3   PHQ - 2 Score 6 6 0 2 6   Altered sleeping - - - - 2   Tired, decreased energy - - - - 3   Change in appetite - - - - 3   Feeling bad or failure about yourself  - - - - 3   Trouble concentrating - - - - 3   Moving slowly or fidgety/restless - - - - 0   Suicidal thoughts - - - - 0   PHQ-9 Score - - - - 20   Difficult doing work/chores - - - - Somewhat difficult     Interpretation of Total Score  Total Score Depression Severity:  1-4 = Minimal depression, 5-9 = Mild depression, 10-14 = Moderate depression, 15-19 = Moderately severe depression, 20-27 = Severe depression   Psychosocial Evaluation and Intervention:   Psychosocial Re-Evaluation:   Psychosocial Discharge (Final Psychosocial Re-Evaluation):   Vocational Rehabilitation: Provide vocational rehab assistance to qualifying candidates.   Vocational Rehab Evaluation & Intervention:   Education: Education Goals: Education classes will be provided on a weekly basis, covering required topics. Participant will state understanding/return demonstration of topics presented.  Learning Barriers/Preferences:     Learning Barriers/Preferences - 11/16/16 0841      Learning Barriers/Preferences   Learning Barriers Sight   Learning Preferences  Video;Skilled Demonstration;Pictoral      Education Topics: Count Your Pulse:  -Group instruction provided by verbal instruction, demonstration, patient participation and written materials to support subject.  Instructors address importance of being able to find your pulse and how to count your pulse when at home without a heart monitor.  Patients get hands on experience counting their pulse with staff help and individually.   Heart Attack, Angina, and Risk Factor Modification:  -Group instruction provided by verbal instruction, video, and written materials to support subject.  Instructors address signs and symptoms of angina and heart attacks.    Also discuss risk factors for heart disease and how to make changes to improve heart health risk factors.   Functional Fitness:  -Group instruction provided by verbal instruction, demonstration, patient participation, and written materials to support subject.  Instructors address safety measures for doing things around the house.  Discuss how to get up and down off the floor, how to pick things up properly, how to safely get out of a chair without assistance, and balance training.   Meditation and Mindfulness:  -Group instruction provided by verbal instruction, patient participation, and written materials to support subject.  Instructor addresses importance of mindfulness and meditation practice to help reduce stress and improve awareness.  Instructor also leads participants through a meditation exercise.    Stretching for Flexibility and Mobility:  -Group instruction provided by verbal instruction, patient participation, and written materials to support subject.  Instructors lead participants through series of stretches that are designed to increase flexibility thus improving mobility.  These stretches are additional exercise for major muscle groups that are typically performed during regular warm up and cool down.   Hands Only CPR Anytime:   -Group instruction provided by verbal instruction, video, patient participation and written materials to support subject.  Instructors co-teach with AHA video for hands only CPR.  Participants get hands on experience with mannequins.   Nutrition I class: Heart Healthy Eating:  -Group instruction provided by PowerPoint slides, verbal discussion, and written materials to support subject matter. The instructor gives an explanation and review of the Therapeutic Lifestyle Changes diet recommendations, which includes a discussion on lipid goals, dietary fat, sodium, fiber, plant stanol/sterol esters, sugar, and the components of a well-balanced, healthy diet.   Nutrition II class: Lifestyle Skills:  -Group instruction provided by PowerPoint slides, verbal discussion, and written materials to support subject matter. The instructor gives an explanation and review of label reading, grocery shopping for heart health, heart healthy recipe modifications, and ways to make healthier choices when eating out.   Diabetes Question & Answer:  -Group instruction provided by PowerPoint slides, verbal discussion, and written materials to support subject matter. The instructor gives an explanation and review of diabetes co-morbidities, pre- and post-prandial blood glucose goals, pre-exercise blood glucose goals, signs, symptoms, and treatment of hypoglycemia and hyperglycemia, and foot care basics.   Diabetes Blitz:  -Group instruction provided by PowerPoint slides, verbal discussion, and written materials to support subject matter. The instructor gives an explanation and review of the physiology behind type 1 and type 2 diabetes, diabetes medications and rational behind using different medications, pre- and post-prandial blood glucose recommendations and Hemoglobin A1c goals, diabetes diet, and exercise including blood glucose guidelines for exercising safely.    Portion Distortion:  -Group instruction provided by  PowerPoint slides, verbal discussion, written materials, and food models to support subject matter. The instructor gives an explanation of serving size versus portion size, changes in portions sizes over the last 20 years, and what consists of a serving from each food group.   Stress Management:  -Group instruction provided by verbal instruction, video, and written materials to support subject matter.  Instructors review role of stress in heart disease and how to cope with stress positively.     Exercising on Your Own:  -Group instruction provided by verbal instruction, power point, and written materials to support subject.  Instructors discuss benefits of exercise, components of exercise, frequency and intensity of exercise, and end points for exercise.  Also discuss use of nitroglycerin and activating EMS.  Review options of places to exercise outside of rehab.  Review guidelines for sex with heart disease.   Cardiac Drugs I:  -Group instruction provided by verbal instruction and written materials to support subject.  Instructor reviews cardiac drug classes: antiplatelets, anticoagulants, beta blockers, and statins.  Instructor discusses reasons,  side effects, and lifestyle considerations for each drug class.   Cardiac Drugs II:  -Group instruction provided by verbal instruction and written materials to support subject.  Instructor reviews cardiac drug classes: angiotensin converting enzyme inhibitors (ACE-I), angiotensin II receptor blockers (ARBs), nitrates, and calcium channel blockers.  Instructor discusses reasons, side effects, and lifestyle considerations for each drug class.   Anatomy and Physiology of the Circulatory System:  -Group instruction provided by verbal instruction, video, and written materials to support subject.  Reviews functional anatomy of heart, how it relates to various diagnoses, and what role the heart plays in the overall system.   Knowledge Questionnaire  Score:     Knowledge Questionnaire Score - 11/16/16 1243      Knowledge Questionnaire Score   Pre Score 15/24      Core Components/Risk Factors/Patient Goals at Admission:     Personal Goals and Risk Factors at Admission - 11/16/16 0842      Core Components/Risk Factors/Patient Goals on Admission    Weight Management Yes;Obesity;Weight Loss;Weight Maintenance   Intervention Weight Management: Develop a combined nutrition and exercise program designed to reach desired caloric intake, while maintaining appropriate intake of nutrient and fiber, sodium and fats, and appropriate energy expenditure required for the weight goal.;Weight Management/Obesity: Establish reasonable short term and long term weight goals.;Weight Management: Provide education and appropriate resources to help participant work on and attain dietary goals.;Obesity: Provide education and appropriate resources to help participant work on and attain dietary goals.   Expected Outcomes Short Term: Continue to assess and modify interventions until short term weight is achieved;Weight Maintenance: Understanding of the daily nutrition guidelines, which includes 25-35% calories from fat, 7% or less cal from saturated fats, less than 200mg  cholesterol, less than 1.5gm of sodium, & 5 or more servings of fruits and vegetables daily;Weight Loss: Understanding of general recommendations for a balanced deficit meal plan, which promotes 1-2 lb weight loss per week and includes a negative energy balance of [phone] kcal/d;Understanding recommendations for meals to include 15-35% energy as protein, 25-35% energy from fat, 35-60% energy from carbohydrates, less than 200mg  of dietary cholesterol, 20-35 gm of total fiber daily;Understanding of distribution of calorie intake throughout the day with the consumption of 4-5 meals/snacks   Diabetes Yes   Intervention Provide education about signs/symptoms and action to take for hypo/hyperglycemia.;Provide  education about proper nutrition, including hydration, and aerobic/resistive exercise prescription along with prescribed medications to achieve blood glucose in normal ranges: Fasting glucose 65-99 mg/dL   Expected Outcomes Short Term: Participant verbalizes understanding of the signs/symptoms and immediate care of hyper/hypoglycemia, proper foot care and importance of medication, aerobic/resistive exercise and nutrition plan for blood glucose control.;Long Term: Attainment of HbA1C < 7%.   Hypertension Yes   Intervention Provide education on lifestyle modifcations including regular physical activity/exercise, weight management, moderate sodium restriction and increased consumption of fresh fruit, vegetables, and low fat dairy, alcohol moderation, and smoking cessation.;Monitor prescription use compliance.   Expected Outcomes Short Term: Continued assessment and intervention until BP is < 140/98mm HG in hypertensive participants. < 130/32mm HG in hypertensive participants with diabetes, heart failure or chronic kidney disease.;Long Term: Maintenance of blood pressure at goal levels.   Lipids Yes   Intervention Provide education and support for participant on nutrition & aerobic/resistive exercise along with prescribed medications to achieve LDL 70mg , HDL >40mg .   Expected Outcomes Short Term: Participant states understanding of desired cholesterol values and is compliant with medications prescribed. Participant is following exercise prescription and  nutrition guidelines.;Long Term: Cholesterol controlled with medications as prescribed, with individualized exercise RX and with personalized nutrition plan. Value goals: LDL < 70mg , HDL > 40 mg.   Personal Goal Other Yes   Personal Goal short: get more active long: improve outlook on life   Intervention Provide exercise programming to assist with improving cardiorespiratory fitness and improving confidence and mood with exercise.   Expected Outcomes Pt will  have increased confidence with exercise, self and overall well being      Core Components/Risk Factors/Patient Goals Review:    Core Components/Risk Factors/Patient Goals at Discharge (Final Review):    ITP Comments:     ITP Comments    Row Name 11/16/16 0839           ITP Comments Dr. Fransico Him, Medical Director          Comments: Ylonda attended orientation from 0800 to 1000 to review rules and guidelines for program. Completed 6 minute walk test, Intitial ITP, and exercise prescription.  VSS. Telemetry-Sinus Rhtyhm.  Asymptomatic. Jaylinn is looking forward to participating in phase 2 cardiac rehab.Barnet Pall, RN,BSN 11/16/2016 4:38 PM

## 2016-11-16 NOTE — Progress Notes (Signed)
Cardiac Rehab Medication Review by a Pharmacist  Does the patient  feel that his/her medications are working for him/her?  yes  Has the patient been experiencing any side effects to the medications prescribed?  Yes - diarrhea (chronic, unsure if because of meds)  Does the patient measure his/her own blood pressure or blood glucose at home?  yes   Does the patient have any problems obtaining medications due to transportation or finances?   No - about to switch to mail order, in the meantime she has been getting delivery to her home.  Understanding of regimen: good Understanding of indications: good Potential of compliance: good    Pharmacist comments: Patient arrives in good spirits, ambulating without assistance. Per patient, no longer homeless, obtained housing last week. Has meds delivered to home in pill packs, daughter "sets up" medications in pill box if not in pack. Patient states that she is now switching to mail order pharmacy, preferred by insurance. She states that her daughter will then take over filling pill boxes completely as patient cannot see small print due to glaucoma. Patient had many questions about narcotics and pain medication, educated on why gabapentin dose continues increase and potential ADE of narcotics that providers have to consider when prescribing them for pain control. Patient verbalized understanding and appreciation for explanation. Per patient, taking xarelto qAM, counseled on admin time of xarelto to be taken with largest meal of the day. Patient denies any bleeding, hypoglycemia, problems affording medications since she got insurance again. Per patient, has been having more anxiety attacks than normal, encouraged patient to speak with her PCP about this. Patient complains of chronic diarrhea for which she takes imodium daily. Imodium added to her medication list.   Tamara Henry, Pharm.D. PGY1 Pharmacy Resident 3/8/20188:24 AM Pager [phone]

## 2016-11-16 NOTE — Progress Notes (Signed)
Subjective:    Patient ID: Tamara Henry , female   DOB: [birth year] , 56 y.o..   MRN: [id number]  HPI  Tamara Henry is here for a same day visit for   1. Neck pain: Patient fell out of bed February 10th and she went to the ED. She endorsed chest pain at that time. After her fall she denied any back pain. CXR was normal at that time.  Patient thinks that the neck pain came from falling out of bed although she's had this neck pain before although it is getting worse since 1.5 weeks ago. Has not tried Tylenol as she could not afford this. Patient notes that the pain starts in her neck and then radiates down to her left shoulder. Pain has been getting progressively worse. Has not taking any pain medication. Laying down makes the pain worse. Sitting up makes the pain better. She denies any tingling does have left hand numbness left over from her cardiac surgery. She admits to some weakness. She has never had this type of pain before.   2. Neuropathy: She has neuropathy in all extremities. Is present all day long. At night it's hard for her to put the covers over her extremities. She has been taking her gabapentin as prescribed and she knows that hasn't helped at all.  3. Flatulence: Patient is concerned that she is at increased flatulence as well as increased flatulence odor. She notes that it has been this way since her heart surgery. Her heart surgery was in November 2017. She has that she is very embarrassed by this. She has tried Beano occasionally but nothing else. Her diet has not changed significantly.  Review of Systems: Per HPI. All other systems reviewed and are negative.  Past Medical History: Patient Active Problem List   Diagnosis Date Noted  . Flatulence 11/17/2016  . Neck pain 11/17/2016  . CKD (chronic kidney disease), stage III 11/02/2016  . At risk for sleep apnea 11/01/2016  . CAD (coronary artery disease), native coronary artery 10/26/2016  . Encounter for post surgical wound  check 10/23/2016  . Poor social situation 10/13/2016  . Anemia 08/30/2016  . Leg pain 08/30/2016  . Bilateral low back pain without sciatica 08/30/2016  . 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 Bilateral pulmonary embolism ([place]) 05/17/2016  . Hyperlipidemia 05/17/2016  . Depression 05/17/2016  . Diabetic peripheral neuropathy ([place]) 05/17/2016  . Neuritis of upper extremity, C7 01/15/2015  . DM type 2, uncontrolled, with renal complications ([place]) 85/88/5027  . Hypertension 01/14/2015  . Numbness and tingling of right arm 01/14/2015    Medications: reviewed   Social Hx:  reports that she quit smoking about 11 years ago. She has never used smokeless tobacco.   Objective:   BP 130/80   Pulse 84   Temp 98.2 F (36.8 C) (Oral)   Ht 5\' 4"  (1.626 m)   Wt 238 lb (108 kg)   LMP 09/11/2016   SpO2 93%   BMI 40.85 kg/m  Physical Exam  Gen: NAD, alert, cooperative with exam, well-appearing HEENT: NCAT, PERRL, clear conjunctiva, oropharynx clear, supple neck, missing teeth Cardiac: Regular rate and rhythm, normal S1/S2, no murmur, no edema, capillary refill brisk  Respiratory: Clear to auscultation bilaterally, no wheezes, non-labored breathing Skin: no rashes, normal turgor  Neurological: no gross deficits.  Psych: good insight, normal mood and affect MSK: Neck: Inspection: Normal head position, no muscular atrophy  Palpation: No midline cervical tenderness or muscle spasms Range of Motion:  Full range of motion but there is pain will all movements. Normal neck flexion, extension, lateral movements, and side bending Strength: 5/5 flexion, extension, lateral movements, and side bending. 5/5 upper extremity strength bilaterally.  Sensation: Gross sensation intact in neck and upper extremities Shoulder exam: no gross deformities, no swelling, tenderness, instability; ligaments intact, limited abduction in bilateral arms, does not go past 120   Assessment & Plan:  Diabetic peripheral neuropathy  (HCC) Uncontrolled symptoms. -Increase gabapentin to 900 mg in the morning, 600 mg at lunch and 900 mg before bed -Follow-up in one week  Flatulence Bothersome to patient and she notes that it has increased in frequency and odor is worse. No concerning GI symptoms. -Advised daily use of Beano -We'll follow up as needed for this  Neck pain Chronic but worsening. Associated with left shoulder pain. Shoulder pain likely stemming from neck pain. Had cervical MRI in May 2016 which showed multilevel disc bulges/protrusions, resulting in moderate RIGHT C6-7 neural foraminal narrowing associated with enlarged, edematous exiting RIGHT C7 nerve concerning for neuritis. Mild canal stenosis C3-4, C4-5 and C6-7.  -Advised Tylenol when necessary for pain (cannot take NSAIDs due to CAD) -Given significant findings on previous MRI, will refer to orthopedics at this time    Orders Placed This Encounter  Procedures  . Ambulatory referral to Orthopedics    Referral Priority:   Routine    Referral Type:   Consultation    Referral Reason:   Specialty Services Required    Requested Specialty:   Orthopedic Surgery    Number of Visits Requested:   1   Smitty Cords, MD [place], PGY-2

## 2016-11-17 ENCOUNTER — Telehealth: Payer: Self-pay | Admitting: Cardiology

## 2016-11-17 ENCOUNTER — Telehealth: Payer: Self-pay | Admitting: Family Medicine

## 2016-11-17 ENCOUNTER — Ambulatory Visit (INDEPENDENT_AMBULATORY_CARE_PROVIDER_SITE_OTHER): Payer: Medicare Other | Admitting: Family Medicine

## 2016-11-17 ENCOUNTER — Encounter: Payer: Self-pay | Admitting: Family Medicine

## 2016-11-17 VITALS — BP 130/80 | HR 84 | Temp 98.2°F | Ht 64.0 in | Wt 238.0 lb

## 2016-11-17 DIAGNOSIS — M542 Cervicalgia: Secondary | ICD-10-CM | POA: Insufficient documentation

## 2016-11-17 DIAGNOSIS — I2510 Atherosclerotic heart disease of native coronary artery without angina pectoris: Secondary | ICD-10-CM

## 2016-11-17 DIAGNOSIS — E1142 Type 2 diabetes mellitus with diabetic polyneuropathy: Secondary | ICD-10-CM | POA: Diagnosis not present

## 2016-11-17 DIAGNOSIS — R143 Flatulence: Secondary | ICD-10-CM

## 2016-11-17 MED ORDER — GABAPENTIN 300 MG PO CAPS: ORAL_CAPSULE | ORAL | 3 refills | Status: DC

## 2016-11-17 MED ORDER — ACETAMINOPHEN ER 650 MG PO TBCR: 650.0000 mg | EXTENDED_RELEASE_TABLET | Freq: Three times a day (TID) | ORAL | 2 refills | Status: DC | PRN

## 2016-11-17 MED ORDER — METHOCARBAMOL 750 MG PO TABS: 1500.0000 mg | ORAL_TABLET | Freq: Two times a day (BID) | ORAL | 0 refills | Status: DC | PRN

## 2016-11-17 NOTE — Assessment & Plan Note (Signed)
Chronic but worsening. Associated with left shoulder pain. Shoulder pain likely stemming from neck pain. Had cervical MRI in May 2016 which showed multilevel disc bulges/protrusions, resulting in moderate RIGHT C6-7 neural foraminal narrowing associated with enlarged, edematous exiting RIGHT C7 nerve concerning for neuritis. Mild canal stenosis C3-4, C4-5 and C6-7.  -Advised Tylenol when necessary for pain (cannot take NSAIDs due to CAD) -Given significant findings on previous MRI, will refer to orthopedics at this time

## 2016-11-17 NOTE — Patient Instructions (Signed)
Thank you for coming in today, it was so nice to see you! Today we talked about:    Neck and shoulder pain: I have placed a referral for an orthopedic surgeon to see you  Neuropathy: Take 900 mg of Gabapentin in the morning, 600 at lunch and 900 at bedtime  Please follow up in 1 week. You can schedule this appointment at the front desk before you leave or call the clinic.  Bring in all your medications or supplements to each appointment for review.   If you have any questions or concerns, please do not hesitate to call the office at [phone]. You can also message me directly via MyChart.   Sincerely,  Smitty Cords, MD

## 2016-11-17 NOTE — Telephone Encounter (Signed)
Will forward to provider that saw patient today.  Derl Barrow, RN

## 2016-11-17 NOTE — Telephone Encounter (Signed)
New message  Pt call requesting to speak with RN. Pt states she was referred by Dr. Radford Pax to get xrays and does not know where she was referred to. Please call back to discuss

## 2016-11-17 NOTE — Assessment & Plan Note (Signed)
Bothersome to patient and she notes that it has increased in frequency and odor is worse. No concerning GI symptoms. -Advised daily use of Beano -We'll follow up as needed for this

## 2016-11-17 NOTE — Telephone Encounter (Signed)
Sent refills to pharmacy again

## 2016-11-17 NOTE — Assessment & Plan Note (Signed)
Uncontrolled symptoms. -Increase gabapentin to 900 mg in the morning, 600 mg at lunch and 900 mg before bed -Follow-up in one week

## 2016-11-17 NOTE — Telephone Encounter (Signed)
I spoke with.  She states she was referred by Dr. Radford Pax to be eval for for lung dz. I reviewed current referrals with this patient and advised her Dr. Juanito Doom has referred her to [facility] for eval.  She voiced understanding and apologized for the confusion. I advised her that was ok and tcb if she had further questions. She voiced understanding and thanks.

## 2016-11-17 NOTE — Telephone Encounter (Signed)
Pt called because the pharmacy said that they never received the prescriptions we e-filed on 11/13/16 and today. We sent Robaxin on 11/13/16 and today we sent Gabapentin and Tylenol 8 hour arthritis. Can we call and check if possible. jw

## 2016-11-20 ENCOUNTER — Ambulatory Visit (INDEPENDENT_AMBULATORY_CARE_PROVIDER_SITE_OTHER): Payer: Medicare Other | Admitting: Orthopedic Surgery

## 2016-11-20 ENCOUNTER — Encounter (HOSPITAL_COMMUNITY): Payer: Medicare Other

## 2016-11-22 ENCOUNTER — Ambulatory Visit (INDEPENDENT_AMBULATORY_CARE_PROVIDER_SITE_OTHER): Payer: Medicare Other | Admitting: Orthopaedic Surgery

## 2016-11-22 ENCOUNTER — Ambulatory Visit (INDEPENDENT_AMBULATORY_CARE_PROVIDER_SITE_OTHER): Payer: Medicare Other

## 2016-11-22 ENCOUNTER — Encounter (HOSPITAL_COMMUNITY)
Admission: RE | Admit: 2016-11-22 | Discharge: 2016-11-22 | Disposition: A | Discharge status: Home / Self Care | Payer: Medicare Other | Source: Ambulatory Visit | Attending: Cardiology | Admitting: Cardiology

## 2016-11-22 ENCOUNTER — Encounter (INDEPENDENT_AMBULATORY_CARE_PROVIDER_SITE_OTHER): Payer: Self-pay | Admitting: Orthopaedic Surgery

## 2016-11-22 VITALS — BP 159/91 | HR 77 | Ht 64.0 in | Wt 231.0 lb

## 2016-11-22 DIAGNOSIS — M25512 Pain in left shoulder: Secondary | ICD-10-CM | POA: Diagnosis not present

## 2016-11-22 DIAGNOSIS — M542 Cervicalgia: Secondary | ICD-10-CM

## 2016-11-22 DIAGNOSIS — Z951 Presence of aortocoronary bypass graft: Secondary | ICD-10-CM | POA: Diagnosis not present

## 2016-11-22 DIAGNOSIS — I2510 Atherosclerotic heart disease of native coronary artery without angina pectoris: Secondary | ICD-10-CM

## 2016-11-22 DIAGNOSIS — I214 Non-ST elevation (NSTEMI) myocardial infarction: Secondary | ICD-10-CM

## 2016-11-22 LAB — GLUCOSE, CAPILLARY
GLUCOSE-CAPILLARY: 277 mg/dL — AB (ref 65–99)
Glucose-Capillary: 331 mg/dL — ABNORMAL HIGH (ref 65–99)

## 2016-11-22 NOTE — Progress Notes (Signed)
Office Visit Note   Patient: Tamara Henry           Date of Birth: May 29, 1961           MRN: [id number] Visit Date: 11/22/2016              Requested by: Carlyle Dolly, MD [place], [place] [id number] PCP: Carlyle Dolly, MD   Assessment & Plan: Visit Diagnoses:  1. Neck pain   2. Acute pain of left shoulder     Plan: Obtain MRI cervical spine and also left shoulder rule out rotator cuff tear. She states her pain is 10 out of 10 severe. She is not a good candidate for prednisone pack due to her hyperglycemia and poor control. Last A1c 8.9.  Follow-Up Instructions: Office follow-up 2 weeks to review his studies.  Orders:  Orders Placed This Encounter  Procedures  . XR Cervical Spine 2 or 3 views  . XR Shoulder Left   No orders of the defined types were placed in this encounter.     Procedures: No procedures performed   Clinical Data: No additional findings.   Subjective: Chief Complaint  Patient presents with  . Neck - Pain  . Left Shoulder - Pain    Patient presents with left neck and shoulder pain. She states that she has had this pain x 2 weeks. The pain is unbearable and intensifies when she lays down. The pain goes down the back of her left arm and she has tingling in her hand. She has had this tingling since the heart surgery. She has a history of PE and heart attack and had quadruple bypass July 28, 2016.  She takes tylenol for pain with no relief. She is on gabapentin daily. She has had a MRI Cervical Spine in 2016 which is on [place].     Review of Systems 14 point review of system obtained and shows multisystem problems. History of bilateral pulmonary embolism, depression, diabetic neuropathy, CABG 4 with significant occlusion. Chronic low back pain, hypertension, chronic shoulder pain on the left, chronic kidney disease, increased BMI 39.6. Patient wears glasses.   Objective: Vital Signs: BP (!) 159/91   Pulse 77   Ht 5'  4" (1.626 m)   Wt 231 lb (104.8 kg)   BMI 39.65 kg/m   Physical Exam  Constitutional: She is oriented to person, place, and time. She appears well-developed.  HENT:  Head: Normocephalic.  Right Ear: External ear normal.  Left Ear: External ear normal.  Eyes: Pupils are equal, round, and reactive to light.  Neck: No tracheal deviation present. No thyromegaly present.  Brachioplexus tenderness on the left. She complains of pain with flexion limited to 50% extension 50%. Reflexes are 2+ and symmetrical.  Cardiovascular: Normal rate.   Median sternotomy.  Pulmonary/Chest: Effort normal. She has no wheezes.  Abdominal: Soft.  Musculoskeletal:  Bilateral brachial plexus tenderness worse in the left than right. Pain with internal rotation left shoulder long head of the biceps is tender. Negative apprehension with passive abduction and neck sore rotation. Biceps triceps brachioradialis reflexes intact. No lower extremity hyperreflexia. Normal heel toe gait. No rash or exposed skin. Pulses at the wrist are normal. Decreased sensation lower extremities legs and feet consistent with peripheral neuropathy history. Good capillary refill.  Neurological: She is alert and oriented to person, place, and time.  Skin: Skin is warm and dry.  Psychiatric: She has a normal mood and affect. Her behavior is normal.  Ortho Exam  Specialty Comments:  No specialty comments available.  Imaging: No results found.   PMFS History: Patient Active Problem List   Diagnosis Date Noted  . Flatulence 11/17/2016  . Neck pain 11/17/2016  . CKD (chronic kidney disease), stage III 11/02/2016  . At risk for sleep apnea 11/01/2016  . CAD (coronary artery disease), native coronary artery 10/26/2016  . Encounter for post surgical wound check 10/23/2016  . Poor social situation 10/13/2016  . Anemia 08/30/2016  . Leg pain 08/30/2016  . Bilateral low back pain without sciatica 08/30/2016  . 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 Bilateral pulmonary embolism ([place]) 05/17/2016  . Hyperlipidemia 05/17/2016  . Depression 05/17/2016  . Diabetic peripheral neuropathy ([place]) 05/17/2016  . Neuritis of upper extremity, C7 01/15/2015  . DM type 2, uncontrolled, with renal complications ([place]) 54/56/2563  . Hypertension 01/14/2015  . Numbness and tingling of right arm 01/14/2015   Past Medical History:  Diagnosis Date  . Acid reflux   . Asthma   . Back pain   . Bilateral pulmonary embolism ([place]) 05/2016  . CAD in native artery    S/P NSTEMI with cath showing severe 3 vessel ASCAD s/p CABGx4 07/28/16.  . Cataracts, bilateral   . CKD (chronic kidney disease), stage III    borderline CKD II-III  . Cocaine abuse    In remission. Stopped using in early 2000's  . Depression   . Diabetes mellitus ([place])   . GERD (gastroesophageal reflux disease)   . Glaucoma   . High cholesterol   . 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 Hx of chest tube placement right   . Hypertension   . Morbid obesity ([place])   . Neuropathy (HCC)    hands and feet  . Pneumonia   . Postoperative anemia 07/2016  . Stroke [facility]) 2005    Family History  Problem Relation Age of Onset  . Pancreatitis Mother   . Diabetes type II Mother   . CAD Father   . Diabetes type II Father   . Diabetes type II Sister   . Diabetes type II Brother     Past Surgical History:  Procedure Laterality Date  . CARDIAC CATHETERIZATION N/A 07/25/2016   Procedure: Left Heart Cath and Coronary Angiography;  Surgeon: Peter M Martinique, MD;  Location: [facility];  Service: Cardiovascular;  Laterality: N/A;  . CORONARY ARTERY BYPASS GRAFT N/A 07/28/2016   Procedure: CORONARY ARTERY BYPASS GRAFTING (CABG)x4 using left internal mammary and endoscopic harvest of right greater saphenous vein;  Surgeon: Ivin Poot, MD;  Location: [facility];  Service: Open Heart Surgery;  Laterality: N/A;  . TEE WITHOUT CARDIOVERSION N/A 07/28/2016   Procedure: TRANSESOPHAGEAL ECHOCARDIOGRAM (TEE);   Surgeon: Ivin Poot, MD;  Location: [facility];  Service: Open Heart Surgery;  Laterality: N/A;  . TUBAL LIGATION     Social History   Occupational History  . disabled    Social History Main Topics  . Smoking status: Former Smoker    Quit date: 09/11/2005  . Smokeless tobacco: Never Used  . Alcohol use No  . Drug use: No  . Sexual activity: Yes    Birth control/ protection: Surgical

## 2016-11-22 NOTE — Progress Notes (Signed)
Daily Session Note  Patient Details  Name: Christionna Poland MRN: [id number] Date of Birth: 1961/05/15 Referring Provider:   Flowsheet Row CARDIAC REHAB PHASE II ORIENTATION from 11/16/2016 in [facility]  Referring Provider  Fransico Him MD      Encounter Date: 11/22/2016  Check In:     Session Check In - 11/22/16 1207      Check-In   Location [facility]   Staff Present Cleda Mccreedy, MS, Exercise Physiologist;Amber Fair, MS, ACSM RCEP, Exercise Physiologist;Maria Whitaker, RN, BSN;Shyam Dawson Wilber Oliphant, RN, BSN   Supervising physician immediately available to respond to emergencies [facility] immediately available   Physician(s) Dr. Sloan Leiter   Medication changes reported     No   Fall or balance concerns reported    No   Tobacco Cessation No Change   Warm-up and Cool-down Performed as group-led instruction   Resistance Training Performed No   VAD Patient? No     Pain Assessment   Currently in Pain? No/denies   Multiple Pain Sites No      Capillary Blood Glucose: Results for orders placed or performed during the hospital encounter of 11/22/16 (from the past 24 hour(s))  Glucose, capillary     Status: Abnormal   Collection Time: 11/22/16  8:47 AM  Result Value Ref Range   Glucose-Capillary 277 (H) 65 - 99 mg/dL  Glucose, capillary     Status: Abnormal   Collection Time: 11/22/16  9:47 AM  Result Value Ref Range   Glucose-Capillary 331 (H) 65 - 99 mg/dL      History  Smoking Status  . Former Smoker  . Quit date: 09/11/2005  Smokeless Tobacco  . Never Used    Goals Met:  Personal goals reviewed No report of cardiac concerns or symptoms  Goals Unmet:  Not Applicable  Comments:  Pt started full exercise at phase II cardiac rehab today.  Pt tolerated light exercise with difficulty on the treadmill. Pt complained of fatigue and felt the workload was too hard at 2.0 speed.  Adjusted speed but pt unable to tolerate.  Pt switched to track.  Talked with EP regarding changing equipment choices until pt builds tolerance and stamina. VSS, telemetry-Sr with no noted ectopy, asymptomatic.  Pt with elevated post exercise blood glucose 331.  Had [place] diabetes educator and dietician regarding food choices and management. Medication list reconciled. Pt denies barriers to medication compliance.  PSYCHOSOCIAL ASSESSMENT:  PHQ-0. Although pt has challenges financially and with recent move  Pt exhibits positive coping skills, hopeful outlook with supportive family which includes daughter who she is living with and grandkids.. Will periodic check in with pt and offer resources as needed.    Pt enjoys swimming, going to church and spending time with grandkids. Pt desires to get more active, increase strength and stamina. Pt long term goal is improve endurance and improve outlook on life. Will periodically check in with pt to assess progression toward meeting these goals. Pt oriented to exercise equipment and routine.    Understanding verbalized. Maurice Small RN, BSN     Dr. Fransico Him is Medical Director for Cardiac Rehab at [facility].

## 2016-11-22 NOTE — Progress Notes (Signed)
Tamara Henry 56 y.o. female Nutrition Note Spoke with pt per RN request. Pre-exercise CBG 270 mg/dL. Post-exercise CBG 330 mg/dL. Pt reports eating a salad with lettuce, cheese croutons, and [place] dressing and drinking water. Pt did not sleep last night due to "being excited about starting rehab." Suspect CBG's elevated in part due to lack of sleep. Nutrition Plan and Nutrition Survey goals reviewed with pt. Pt is following Step 1 of the Therapeutic Lifestyle Changes diet. Pt is diabetic. Last A1c indicates blood glucose not well-controlled. Per discussion, pt has been working toward improving blood glucose control. Pt is living with her daughter and 4 grandchildren. Pt's daughter is the primary cook. Pt states "I don't cook." Pt and her 12 year old grandchild have diabetes and pt's daughter reportedly "loves to cook fried food and makes some convenience foods." Pt is working toward eating healthier and is trying to get her daughter to cook healthier. This Probation officer went over Diabetes Education test results. Pt does not add salt to food. Pt expressed understanding of the information reviewed. Pt aware of nutrition education classes offered.  Lab Results  Component Value Date   HGBA1C 8.9 11/01/2016   Wt Readings from Last 3 Encounters:  11/17/16 238 lb (108 kg)  11/16/16 236 lb 1.8 oz (107.1 kg)  11/01/16 237 lb 9.6 oz (107.8 kg)   Nutrition Diagnosis ? Food-and nutrition-related knowledge deficit related to lack of exposure to information as related to diagnosis of: ? CVD ? DM ? Obesity related to excessive energy intake as evidenced by a BMI of 40.9  Nutrition Intervention ? Pt's individual nutrition plan reviewed with pt. ? Benefits of adopting Therapeutic Lifestyle Changes discussed when Medficts reviewed. ? Pt to attend the Portion Distortion class ? Pt to attend the Diabetes Q & A class ? Pt to attend the   ? Nutrition I class                  ? Nutrition II class     ? Diabetes  Blitz class ? Continue client-centered nutrition education by RD, as part of interdisciplinary care. Goal(s) ? Pt to identify and limit food sources of saturated fat, trans fat, and sodium ? Pt to identify food quantities necessary to achieve weight loss of 6-24 lb (2.7-10.9 kg) at graduation from cardiac rehab.  ? CBG concentrations in the normal range or as close to normal as is safely possible. Monitor and Evaluate progress toward nutrition goal with team. Derek Mound, M.Ed, RD, LDN, CDE 11/22/2016 10:53 AM

## 2016-11-24 ENCOUNTER — Encounter (HOSPITAL_COMMUNITY)
Admission: RE | Admit: 2016-11-24 | Discharge: 2016-11-24 | Disposition: A | Discharge status: Home / Self Care | Payer: Medicare Other | Source: Ambulatory Visit | Attending: Cardiology | Admitting: Cardiology

## 2016-11-24 ENCOUNTER — Ambulatory Visit (INDEPENDENT_AMBULATORY_CARE_PROVIDER_SITE_OTHER): Payer: Medicare Other | Admitting: Family Medicine

## 2016-11-24 ENCOUNTER — Telehealth: Payer: Self-pay | Admitting: *Deleted

## 2016-11-24 ENCOUNTER — Encounter: Payer: Self-pay | Admitting: Family Medicine

## 2016-11-24 VITALS — BP 130/70 | HR 112 | Temp 98.3°F | Ht 64.0 in | Wt 239.6 lb

## 2016-11-24 DIAGNOSIS — M542 Cervicalgia: Secondary | ICD-10-CM | POA: Diagnosis not present

## 2016-11-24 DIAGNOSIS — IMO0002 Reserved for concepts with insufficient information to code with codable children: Secondary | ICD-10-CM

## 2016-11-24 DIAGNOSIS — I214 Non-ST elevation (NSTEMI) myocardial infarction: Secondary | ICD-10-CM

## 2016-11-24 DIAGNOSIS — Z794 Long term (current) use of insulin: Secondary | ICD-10-CM

## 2016-11-24 DIAGNOSIS — I2510 Atherosclerotic heart disease of native coronary artery without angina pectoris: Secondary | ICD-10-CM

## 2016-11-24 DIAGNOSIS — Z951 Presence of aortocoronary bypass graft: Secondary | ICD-10-CM | POA: Diagnosis not present

## 2016-11-24 DIAGNOSIS — E1121 Type 2 diabetes mellitus with diabetic nephropathy: Secondary | ICD-10-CM | POA: Diagnosis present

## 2016-11-24 DIAGNOSIS — I2699 Other pulmonary embolism without acute cor pulmonale: Secondary | ICD-10-CM | POA: Diagnosis not present

## 2016-11-24 DIAGNOSIS — E1165 Type 2 diabetes mellitus with hyperglycemia: Secondary | ICD-10-CM

## 2016-11-24 LAB — GLUCOSE, POCT (MANUAL RESULT ENTRY): POC Glucose: 399 mg/dl — AB (ref 70–99)

## 2016-11-24 LAB — GLUCOSE, CAPILLARY
Glucose-Capillary: 262 mg/dL — ABNORMAL HIGH (ref 65–99)
Glucose-Capillary: 344 mg/dL — ABNORMAL HIGH (ref 65–99)

## 2016-11-24 MED ORDER — TRAMADOL HCL 50 MG PO TABS: 50.0000 mg | ORAL_TABLET | Freq: Three times a day (TID) | ORAL | 0 refills | Status: DC | PRN

## 2016-11-24 MED ORDER — LIDOCAINE 5 % EX OINT: 1.0000 "application " | TOPICAL_OINTMENT | CUTANEOUS | 0 refills | Status: DC | PRN

## 2016-11-24 MED ORDER — BASAGLAR KWIKPEN 100 UNIT/ML ~~LOC~~ SOPN: 40.0000 [IU] | PEN_INJECTOR | Freq: Every day | SUBCUTANEOUS | 2 refills | Status: DC

## 2016-11-24 NOTE — Assessment & Plan Note (Signed)
We'll continue Xarelto for now. Patient is at the six-month mark for Xarelto treatment after bilateral submassive pulmonary embolism in September 2017. Discussed patient with Dr. Andria Frames. It is concerning that she had a bilateral submassive PE that was unprovoked. -We will continue Xarelto at this time -We'll discuss patient with Dr. Valentina Lucks and see if there are any anticoagulation recommendations for this patient

## 2016-11-24 NOTE — Progress Notes (Signed)
Subjective:    Patient ID: Tamara Henry , female   DOB: 1961-04-17 , 56 y.o..   MRN: [id number]  HPI  Tamara Henry is here for  Chief Complaint  Patient presents with  . Diabetes    elevated blood sugars    1. Chronic Diabetes Disease Monitoring  Blood Sugar Ranges: Since Wednesday they have been in the 300's.   Polyuria: no   Visual problems: no   Last hemoglobin A1C:  Lab Results  Component Value Date   HGBA1C 8.9 11/01/2016   Medication Compliance: yes. Takes 35 U Lantus at night and 5 of Novolog in the morning and in the evening Medication Side Effects  Hypoglycemia: no  No lightheaded Or dizziness, patient went to her exercise class today and felt well throughout.   2. Anticoagulation: Patient has been on Xarelto since September 2017 after she was hospitalized for bilateral submassive PE. PE appears to be unprovoked. Per hospital discharge note that states that she should only be on anticoagulation for the next 6 months. Patient is very nervous to stop Xarelto she does not want any more blood clots. She denies any previous blood clots prior to September 2017.   3. Neck pain and Left shoulder pain. Patient notes that she went to the orthopedic doctor a couple days ago. She was told that she may have a tear in her left shoulder. She has been in a lot of pain and is currently in a arm sling. She is currently taking gabapentin and Tylenol but notes that Tylenol has provided no relief.  Past Medical History: Patient Active Problem List   Diagnosis Date Noted  . Flatulence 11/17/2016  . Neck pain 11/17/2016  . CKD (chronic kidney disease), stage III 11/02/2016  . At risk for sleep apnea 11/01/2016  . CAD (coronary artery disease), native coronary artery 10/26/2016  . Encounter for post surgical wound check 10/23/2016  . Poor social situation 10/13/2016  . Anemia 08/30/2016  . Leg pain 08/30/2016  . Bilateral low back pain without sciatica 08/30/2016  . S/P CABG x 4  07/28/2016  . Bilateral pulmonary embolism ([place]) 05/17/2016  . Hyperlipidemia 05/17/2016  . Depression 05/17/2016  . Diabetic peripheral neuropathy ([place]) 05/17/2016  . Neuritis of upper extremity, C7 01/15/2015  . DM type 2, uncontrolled, with renal complications ([place]) 67/89/3810  . Hypertension 01/14/2015  . Numbness and tingling of right arm 01/14/2015    Medications: reviewed and updated Current Outpatient Prescriptions  Medication Sig Dispense Refill  . acetaminophen (TYLENOL 8 HOUR ARTHRITIS PAIN) 650 MG CR tablet Take 1 tablet (650 mg total) by mouth every 8 (eight) hours as needed for pain. 30 tablet 2  . amLODipine (NORVASC) 10 MG tablet Take 10 mg by mouth daily.    Marland Kitchen aspirin 81 MG EC tablet Take 1 tablet (81 mg total) by mouth daily.    Marland Kitchen atorvastatin (LIPITOR) 80 MG tablet Take 1 tablet (80 mg total) by mouth every evening. 90 tablet 3  . ezetimibe (ZETIA) 10 MG tablet Take 10 mg by mouth daily.  2  . ferrous sulfate 325 (65 FE) MG tablet Take 1 tablet (325 mg total) by mouth daily with breakfast. 30 tablet 3  . furosemide (LASIX) 40 MG tablet Take 1 tablet (40 mg total) by mouth daily. 30 tablet 0  . gabapentin (NEURONTIN) 300 MG capsule Take 900mg  in the morning, 600 mg at lunch, and 900 mg at bedtime 200 capsule 3  . insulin aspart (NOVOLOG) 100 UNIT/ML  injection Inject 5 Units into the skin 2 (two) times daily before a meal.     . Insulin Glargine (BASAGLAR KWIKPEN) 100 UNIT/ML SOPN Inject 0.4 mLs (40 Units total) into the skin at bedtime. 3 pen 2  . loperamide (IMODIUM) 2 MG capsule Take 2 mg by mouth daily as needed for diarrhea or loose stools.    Marland Kitchen MELATONIN PO Take 1 tablet by mouth at bedtime.     . methocarbamol (ROBAXIN) 750 MG tablet Take 2 tablets (1,500 mg total) by mouth 2 (two) times daily as needed for muscle spasms. 30 tablet 0  . metoprolol (LOPRESSOR) 25 MG tablet Take 1 tablet (25 mg total) by mouth 2 (two) times daily. 60 tablet 0  . Multiple  Vitamins-Minerals (MULTIVITAMIN) tablet Take 1 tablet by mouth daily. 30 tablet 5  . omeprazole (PRILOSEC) 40 MG capsule Take 40 mg by mouth daily.    Marland Kitchen PARoxetine (PAXIL) 40 MG tablet Take 40 mg by mouth daily with breakfast.    . potassium chloride 20 MEQ TBCR Take 10 mEq by mouth daily. 30 tablet 0  . Probiotic Product (ALIGN) 4 MG CAPS Take 1 capsule (4 mg total) by mouth daily. 30 capsule 2  . rivaroxaban (XARELTO) 20 MG TABS tablet Take 1 tablet (20 mg total) by mouth every morning. 90 tablet 0  . traMADol (ULTRAM) 50 MG tablet Take 1 tablet (50 mg total) by mouth every 8 (eight) hours as needed. 10 tablet 0   No current facility-administered medications for this visit.     Social Hx:  reports that she quit smoking about 11 years ago. She has never used smokeless tobacco.   Objective:   BP 130/70 (BP Location: Right Arm, Patient Position: Sitting, Cuff Size: Large)   Pulse (!) 112   Temp 98.3 F (36.8 C) (Oral)   Ht 5\' 4"  (1.626 m)   Wt 239 lb 9.6 oz (108.7 kg)   LMP 09/11/2016 (Approximate)   SpO2 97%   BMI 41.13 kg/m  Physical Exam  Gen: NAD, alert, cooperative with exam, well-appearing Cardiac: Regular rate and rhythm, normal S1/S2, no murmur, no edema, capillary refill brisk  Respiratory: Clear to auscultation bilaterally, no wheezes, non-labored breathing Psych: good insight, normal mood and affect  CBG (last 3)   Recent Labs  11/22/16 0947 11/24/16 0704 11/24/16 0800  GLUCAP 331* 262* 344*    Assessment & Plan:  Bilateral pulmonary embolism ([place]) We'll continue Xarelto for now. Patient is at the six-month mark for Xarelto treatment after bilateral submassive pulmonary embolism in September 2017. Discussed patient with Dr. Andria Frames. It is concerning that she had a bilateral submassive PE that was unprovoked. -We will continue Xarelto at this time -We'll discuss patient with Dr. Valentina Lucks and see if there are any anticoagulation recommendations for this patient  DM  type 2, uncontrolled, with renal complications ([place]) Uncontrolled. Last hemoglobin A1c was 8.9 in February 2018. Fasting glucose has been elevated over the last week or so per patient in the 300s. glucose today is 344. No change in her diet. Suspect that patient is developing more insulin resistance. No recent steroid use or illness that may make her glucose, prior. -We will increase insulin glargine to 40 units at bedtime -Continue NovoLog 5 mg twice daily with biggest meals of the day -Check hemoglobin A1c again in May 2018 -Check glucose every day, discussed hypoglycemic symptoms  -Follow-up in one month  Neck pain Chronic but worsening. Saw orthopedic doctor 2 days ago. She  notes that she is waiting to do reimaging of her shoulder at orthopedic's office and was told she had a rotator cuff tear. Pain is uncontrolled with Tylenol and gabapentin. Unable to take NSAIDs due to CAD. Unable to do steroid injections due to hyperglycemia and uncontrolled diabetes. -Continue Tylenol as prescribed -Attempted to prescribe topical lidocaine for patient but this was not covered by her insurance, canceled prescription -We'll give short course of tramadol 50 mg every 8 hours as needed for severe pain #10 pills prescribed. Discussed with patient that this is a short-term medication. -Will defer neck and shoulder pain to orthopedic specialist  Orders Placed This Encounter  Procedures  . Glucose (CBG)   Meds ordered this encounter  Medications  . DISCONTD: lidocaine (XYLOCAINE) 5 % ointment    Sig: Apply 1 application topically as needed.    Dispense:  35.44 g    Refill:  0  . Insulin Glargine (BASAGLAR KWIKPEN) 100 UNIT/ML SOPN    Sig: Inject 0.4 mLs (40 Units total) into the skin at bedtime.    Dispense:  3 pen    Refill:  2  . traMADol (ULTRAM) 50 MG tablet    Sig: Take 1 tablet (50 mg total) by mouth every 8 (eight) hours as needed.    Dispense:  10 tablet    Refill:  0    Smitty Cords,  MD [place], PGY-2

## 2016-11-24 NOTE — Assessment & Plan Note (Addendum)
Uncontrolled. Last hemoglobin A1c was 8.9 in February 2018. Fasting glucose has been elevated over the last week or so per patient in the 300s. glucose today is 344. No change in her diet. Suspect that patient is developing more insulin resistance. No recent steroid use or illness that may make her glucose, prior. -We will increase insulin glargine to 40 units at bedtime -Continue NovoLog 5 mg twice daily with biggest meals of the day -Check hemoglobin A1c again in May 2018 -Check glucose every day, discussed hypoglycemic symptoms  -Follow-up in one month

## 2016-11-24 NOTE — Patient Instructions (Signed)
Thank you for coming in today, it was so nice to see you! Today we talked about:    Diabetes: We will increase your Lantus to 40 units at night. Continue the novolog 5 units twice daily. Continue checking your sugar in the morning and at night.   Continue Xarleto as prescribed  Take Tylenol for your shoulder pain. Take Tramadol only when your pain becomes severe.   Please follow up in 1 month. You can schedule this appointment at the front desk before you leave or call the clinic.  Bring in all your medications or supplements to each appointment for review.    If you have any questions or concerns, please do not hesitate to call the office at [phone]. You can also message me directly via MyChart.   Sincerely,  Smitty Cords, MD

## 2016-11-24 NOTE — Telephone Encounter (Signed)
Prior Authorization received from [facility] for Lidocaine 5% ointment.  PA form placed in provider box for completion. Derl Barrow, RN

## 2016-11-24 NOTE — Assessment & Plan Note (Addendum)
Chronic but worsening. Saw orthopedic doctor 2 days ago. She notes that she is waiting to do reimaging of her shoulder at orthopedic's office and was told she had a rotator cuff tear. Pain is uncontrolled with Tylenol and gabapentin. Unable to take NSAIDs due to CAD. Unable to do steroid injections due to hyperglycemia and uncontrolled diabetes. -Continue Tylenol as prescribed -Attempted to prescribe topical lidocaine for patient but this was not covered by her insurance, canceled prescription -We'll give short course of tramadol 50 mg every 8 hours as needed for severe pain #10 pills prescribed. Discussed with patient that this is a short-term medication. -Will defer neck and shoulder pain to orthopedic specialist

## 2016-11-27 ENCOUNTER — Telehealth: Payer: Self-pay | Admitting: Family Medicine

## 2016-11-27 ENCOUNTER — Encounter (HOSPITAL_COMMUNITY)
Admission: RE | Admit: 2016-11-27 | Discharge: 2016-11-27 | Disposition: A | Discharge status: Home / Self Care | Payer: Medicare Other | Source: Ambulatory Visit | Attending: Cardiology | Admitting: Cardiology

## 2016-11-27 ENCOUNTER — Encounter (INDEPENDENT_AMBULATORY_CARE_PROVIDER_SITE_OTHER): Payer: Self-pay | Admitting: *Deleted

## 2016-11-27 DIAGNOSIS — Z951 Presence of aortocoronary bypass graft: Secondary | ICD-10-CM | POA: Diagnosis not present

## 2016-11-27 DIAGNOSIS — I214 Non-ST elevation (NSTEMI) myocardial infarction: Secondary | ICD-10-CM

## 2016-11-27 LAB — GLUCOSE, CAPILLARY: Glucose-Capillary: 391 mg/dL — ABNORMAL HIGH (ref 65–99)

## 2016-11-27 MED ORDER — BASAGLAR KWIKPEN 100 UNIT/ML ~~LOC~~ SOPN: 45.0000 [IU] | PEN_INJECTOR | Freq: Every day | SUBCUTANEOUS | 2 refills | Status: DC

## 2016-11-27 NOTE — Telephone Encounter (Signed)
Received call from patient's cardiac rehab today that her glucose remains uncontrolled and it was 391 this morning. Called patient to discuss. She notes that she hasn't checked her CBGs this weekend but she has been taking the 40 units of long acting insulin since Friday night. Advised patient to increase long acting insulin to 45 units nightly and to please check her glucose in the morning and at night at least. Discussed hypoglycemic symptoms and asked her to please call me if her glucose is either below 100 or above 300.   Smitty Cords, MD [place], PGY-2

## 2016-11-27 NOTE — Telephone Encounter (Signed)
Patient states she is no longer taking these medications

## 2016-11-27 NOTE — Progress Notes (Signed)
Pt arrived at cardiac rehab, CBG-391.  Pt had eaten oatmeal with equal and black coffee.  Pt had taken novolog 5units  approximately 1 hour prior to eating.  Pt asymptomatic. Pt reports per Dr. Alta Corning she increased lantus 40 units qPM starting 11/24/2016.  Unfortunately, pt has not checked her home CBG past 2 days to notice a fasting AM trend.  PC to Dr. Alta Corning to advise.  No new orders received.  Pt does report stress and anxiety from morning routine to accommodate  transportation schedule. Pt also reports increased shoulder pain, pt afraid to take tramadol this am due to sleepiness.  Pt MRI scheduled 12/07/16.  Pt instructed to hold cardiac rehab until after MRI results with treatment plan established.  Pt also instructed to notify Dr. Alta Corning if CBG >300 daily. Pt advised to increase PO water intake today to lower BG level.  Pt tearful with this treatment plan.  Pt has endured significant physical and housing stressors over past several months.  Pt offered appointment with Jeanella Craze for counseling to discuss these stressors.  Pt offered emotional support and reassurance.  Understanding verbalized.

## 2016-11-27 NOTE — Telephone Encounter (Signed)
-----   Message from Sueanne Margarita, MD sent at 11/27/2016 11:40 AM EDT ----- Elevated BS at Cardiac Rehab - please forward to primary MD

## 2016-11-27 NOTE — Telephone Encounter (Signed)
Cancelled this medication.

## 2016-11-27 NOTE — Progress Notes (Signed)
Tamara Henry 56 y.o. female Nutrition Note Spoke with pt per RN request. Pre-exercise CBG > 300 mg/dL. Insulin increased on Friday 11/24/16. Pt reports taking her increased insulin dose but she did not check her fasting CBG's yesterday or today "because I was in a rush." Pt took her Novolog this morning, rode in the transportation van/bus to Cardiac Rehab, and then ate oatmeal with "blue sugar" (Equal) and a cup of coffee. The importance of taking rapid-acting insulin right before eating reinforced. Pt states "I have a history of not taking my insulin correctly and I'm trying to change that." Pt reports reusing injection needles "because I'm lazy." Pt discouraged from reusing needles. Pt switched to injection pens, which should help with accuracy. Pt feels it is easier to see the amount of insulin she's injecting. Previously, pt's daughter would check behind pt to ensure accurate dosage "because I have difficulty seeing." Pt reports rotating injection sites and denies any lipomas. Andi Hence, RN contacted MD re: hyperglycemia. MD's office will check in with pt later today. Some hyperglycemia may be due to pain with shoulder injury. Lack of sleep should not be contributing to hyperglycemia today given pt reports sleeping last night. Pt expressed understanding of the information reviewed. Nutrition Diagnosis ? Food-and nutrition-related knowledge deficit related to lack of exposure to information as related to diagnosis of: ? CVD ? DM ? Obesity related to excessive energy intake as evidenced by a BMI of 40.9 Nutrition Intervention ? Pt's individual nutrition plan reviewed with pt. ? Pt to check CBG's before leaving her house for exercise. ? Pt to consider changing exercise class time to allow pt to not feel rushed. ? Pt to get CBG's under better control before returning to rehab.  ? Continue client-centered nutrition education by RD, as part of interdisciplinary care. Goal(s) ? Pt to identify and limit  food sources of saturated fat, trans fat, and sodium ? Pt to identify food quantities necessary to achieve weight loss of 6-24 lb (2.7-10.9 kg) at graduation from cardiac rehab.  ? CBG concentrations in the normal range or as close to normal as is safely possible. Monitor and Evaluate progress toward nutrition goal with team. Derek Mound, M.Ed, RD, LDN, CDE 11/27/2016 9:29 AM

## 2016-11-29 ENCOUNTER — Encounter (HOSPITAL_COMMUNITY)
Admission: RE | Admit: 2016-11-29 | Discharge: 2016-11-29 | Disposition: A | Discharge status: Home / Self Care | Payer: Medicare Other | Source: Ambulatory Visit

## 2016-11-29 NOTE — Progress Notes (Signed)
Discharge Summary  Patient Details  Name: Tamara Henry MRN: [id number] Date of Birth: [birth year] Referring Provider:     CARDIAC REHAB PHASE II ORIENTATION from 11/16/2016 in [place]  Referring Provider  Fransico Him MD       Number of Visits: 2  Reason for Discharge:  Early Exit:  Personal-pt with uncontrolled diabetes-hyperglycemic.  Pt c/o chronic shoulder pain, MRI pending.  Pt will be discharged at this time due to inability to exercise for the above.  Plan to restart cardiac rehab when medical issues controlled.   Smoking History:  History  Smoking Status  . Former Smoker  . Quit date: 09/11/2005  Smokeless Tobacco  . Never Used    Diagnosis:  No diagnosis found.  ADL UCSD:   Initial Exercise Prescription:     Initial Exercise Prescription - 11/16/16 1200      Date of Initial Exercise RX and Referring Provider   Date 11/16/16   Referring Provider Fransico Him MD     Treadmill   MPH 2.3   Grade 0   Minutes 10   METs 2.76     Bike   Level 0.5   Minutes 10   METs 1.9     NuStep   Level 3   SPM 80   Minutes 10   METs 2     Prescription Details   Frequency (times per week) 3   Duration Progress to 30 minutes of continuous aerobic without signs/symptoms of physical distress     Intensity   THRR 40-80% of Max Heartrate 67-134   Ratings of Perceived Exertion 11-13   Perceived Dyspnea 0-4     Progression   Progression Continue to progress workloads to maintain intensity without signs/symptoms of physical distress.     Resistance Training   Training Prescription Yes   Weight 2lbs   Reps 10-15      Discharge Exercise Prescription (Final Exercise Prescription Changes):   Functional Capacity:     6 Minute Walk    Row Name 11/16/16 1245         6 Minute Walk   Phase Initial     Distance 1385 feet     Walk Time 6 minutes     # of Rest Breaks 0     MPH 2.62     METS 3.47     RPE 13     VO2 Peak  12.16     Symptoms No     Resting HR 84 bpm     Resting BP 144/78     Max Ex. HR 108 bpm     Max Ex. BP 138/80     2 Minute Post BP 128/72        Psychological, QOL, Others - Outcomes: PHQ 2/9: Depression screen [facility] 2/9 11/22/2016 11/17/2016 11/01/2016 10/13/2016 09/06/2016  Decreased Interest 0 0 3 3 0  Down, Depressed, Hopeless 0 0 3 3 0  PHQ - 2 Score 0 0 6 6 0  Altered sleeping - - - - -  Tired, decreased energy - - - - -  Change in appetite - - - - -  Feeling bad or failure about yourself  - - - - -  Trouble concentrating - - - - -  Moving slowly or fidgety/restless - - - - -  Suicidal thoughts - - - - -  PHQ-9 Score - - - - -  Difficult doing work/chores - - - - -  Quality of Life:     Quality of Life - 11/16/16 1255      Quality of Life Scores   Health/Function Pre 15.87 %   Socioeconomic Pre 10 %   Psych/Spiritual Pre 10.29 %   Family Post 7.5 %   GLOBAL Pre 12.5 %      Personal Goals: Goals established at orientation with interventions provided to work toward goal.     Personal Goals and Risk Factors at Admission - 11/16/16 0842      Core Components/Risk Factors/Patient Goals on Admission    Weight Management Yes;Obesity;Weight Loss   Intervention Weight Management: Develop a combined nutrition and exercise program designed to reach desired caloric intake, while maintaining appropriate intake of nutrient and fiber, sodium and fats, and appropriate energy expenditure required for the weight goal.;Weight Management/Obesity: Establish reasonable short term and long term weight goals.;Weight Management: Provide education and appropriate resources to help participant work on and attain dietary goals.;Obesity: Provide education and appropriate resources to help participant work on and attain dietary goals.   Expected Outcomes Short Term: Continue to assess and modify interventions until short term weight is achieved;Weight Maintenance: Understanding of the daily  nutrition guidelines, which includes 25-35% calories from fat, 7% or less cal from saturated fats, less than 200mg  cholesterol, less than 1.5gm of sodium, & 5 or more servings of fruits and vegetables daily;Weight Loss: Understanding of general recommendations for a balanced deficit meal plan, which promotes 1-2 lb weight loss per week and includes a negative energy balance of [phone] kcal/d;Understanding recommendations for meals to include 15-35% energy as protein, 25-35% energy from fat, 35-60% energy from carbohydrates, less than 200mg  of dietary cholesterol, 20-35 gm of total fiber daily;Understanding of distribution of calorie intake throughout the day with the consumption of 4-5 meals/snacks   Diabetes Yes   Intervention Provide education about signs/symptoms and action to take for hypo/hyperglycemia.;Provide education about proper nutrition, including hydration, and aerobic/resistive exercise prescription along with prescribed medications to achieve blood glucose in normal ranges: Fasting glucose 65-99 mg/dL   Expected Outcomes Short Term: Participant verbalizes understanding of the signs/symptoms and immediate care of hyper/hypoglycemia, proper foot care and importance of medication, aerobic/resistive exercise and nutrition plan for blood glucose control.;Long Term: Attainment of HbA1C < 7%.   Hypertension Yes   Intervention Provide education on lifestyle modifcations including regular physical activity/exercise, weight management, moderate sodium restriction and increased consumption of fresh fruit, vegetables, and low fat dairy, alcohol moderation, and smoking cessation.;Monitor prescription use compliance.   Expected Outcomes Short Term: Continued assessment and intervention until BP is < 140/35mm HG in hypertensive participants. < 130/46mm HG in hypertensive participants with diabetes, heart failure or chronic kidney disease.;Long Term: Maintenance of blood pressure at goal levels.   Lipids Yes    Intervention Provide education and support for participant on nutrition & aerobic/resistive exercise along with prescribed medications to achieve LDL 70mg , HDL >40mg .   Expected Outcomes Short Term: Participant states understanding of desired cholesterol values and is compliant with medications prescribed. Participant is following exercise prescription and nutrition guidelines.;Long Term: Cholesterol controlled with medications as prescribed, with individualized exercise RX and with personalized nutrition plan. Value goals: LDL < 70mg , HDL > 40 mg.   Personal Goal Other Yes   Personal Goal short: get more active long: improve outlook on life   Intervention Provide exercise programming to assist with improving cardiorespiratory fitness and improving confidence and mood with exercise.   Expected Outcomes Pt will have increased confidence with exercise,  self and overall well being       Personal Goals Discharge:   Nutrition & Weight - Outcomes:     Pre Biometrics - 11/16/16 1254      Pre Biometrics   Waist Circumference 47.75 inches   Hip Circumference 54.25 inches   Waist to Hip Ratio 0.88 %   Triceps Skinfold 45 mm   % Body Fat 50.5 %   Grip Strength 33 kg   Flexibility 8 in   Single Leg Stand 6.09 seconds       Nutrition:     Nutrition Therapy & Goals - 11/21/16 1523      Nutrition Therapy   Diet Carb Modified, Therapeutic Lifestyle Changes     Personal Nutrition Goals   Nutrition Goal Wt loss of 1-2 lb/week to a wt loss goal of 6-24 lb at graduation from [facility] Goal #2 Improved glycemic control as evidenced by pt's A1c trending from 8.9 (11/01/16) to less than 7.0.     Intervention Plan   Intervention Prescribe, educate and counsel regarding individualized specific dietary modifications aiming towards targeted core components such as weight, hypertension, lipid management, diabetes, heart failure and other comorbidities.   Expected Outcomes Short Term  Goal: Understand basic principles of dietary content, such as calories, fat, sodium, cholesterol and nutrients.;Long Term Goal: Adherence to prescribed nutrition plan.      Nutrition Discharge:     Nutrition Assessments - 11/22/16 1049      MEDFICTS Scores   Pre Score 66      Education Questionnaire Score:     Knowledge Questionnaire Score - 11/16/16 1243      Knowledge Questionnaire Score   Pre Score 15/24          DM 14/15      Goals reviewed with patient; copy given to patient.

## 2016-12-01 ENCOUNTER — Other Ambulatory Visit: Payer: Self-pay | Admitting: *Deleted

## 2016-12-01 ENCOUNTER — Encounter (HOSPITAL_COMMUNITY): Admission: RE | Admit: 2016-12-01 | Payer: Medicare Other | Source: Ambulatory Visit

## 2016-12-01 MED ORDER — METOPROLOL TARTRATE 25 MG PO TABS: 25.0000 mg | ORAL_TABLET | Freq: Two times a day (BID) | ORAL | 0 refills | Status: DC

## 2016-12-01 MED ORDER — FUROSEMIDE 40 MG PO TABS: 40.0000 mg | ORAL_TABLET | Freq: Every day | ORAL | 0 refills | Status: DC

## 2016-12-01 MED ORDER — INSULIN ASPART 100 UNIT/ML ~~LOC~~ SOLN: 5.0000 [IU] | Freq: Two times a day (BID) | SUBCUTANEOUS | 3 refills | Status: DC

## 2016-12-01 MED ORDER — OMEPRAZOLE 40 MG PO CPDR: 40.0000 mg | DELAYED_RELEASE_CAPSULE | Freq: Every day | ORAL | 3 refills | Status: DC

## 2016-12-01 MED ORDER — POTASSIUM CHLORIDE ER 20 MEQ PO TBCR: 10.0000 meq | EXTENDED_RELEASE_TABLET | Freq: Every day | ORAL | 0 refills | Status: DC

## 2016-12-01 MED ORDER — THERA VITAL M PO TABS: 1.0000 | ORAL_TABLET | Freq: Every day | ORAL | 5 refills | Status: DC

## 2016-12-01 NOTE — Telephone Encounter (Signed)
Received fax from [facility].  They are patient's new pharmacy and request refills for the following medication listed.  Please provide Rxs to continued or discontinue if no longer needed.  Derl Barrow, RN

## 2016-12-04 ENCOUNTER — Encounter (HOSPITAL_COMMUNITY): Payer: Medicare Other

## 2016-12-04 ENCOUNTER — Telehealth: Payer: Self-pay | Admitting: Family Medicine

## 2016-12-04 NOTE — Telephone Encounter (Signed)
Pt says [facility] is sending a fax for drs signature. She is requesting incontinent supplies thru this company.

## 2016-12-05 NOTE — Telephone Encounter (Signed)
Dr. Rico Sheehan. Ottis Stain, CMA

## 2016-12-06 ENCOUNTER — Encounter (HOSPITAL_COMMUNITY): Payer: Medicare Other

## 2016-12-07 ENCOUNTER — Ambulatory Visit
Admission: RE | Admit: 2016-12-07 | Discharge: 2016-12-07 | Disposition: A | Discharge status: Home / Self Care | Payer: Medicare Other | Source: Ambulatory Visit | Attending: Surgery | Admitting: Surgery

## 2016-12-07 ENCOUNTER — Other Ambulatory Visit: Payer: Self-pay | Admitting: Family Medicine

## 2016-12-07 DIAGNOSIS — M542 Cervicalgia: Secondary | ICD-10-CM

## 2016-12-07 DIAGNOSIS — M25512 Pain in left shoulder: Secondary | ICD-10-CM

## 2016-12-07 DIAGNOSIS — M50221 Other cervical disc displacement at C4-C5 level: Secondary | ICD-10-CM | POA: Diagnosis not present

## 2016-12-07 DIAGNOSIS — S46012A Strain of muscle(s) and tendon(s) of the rotator cuff of left shoulder, initial encounter: Secondary | ICD-10-CM | POA: Diagnosis not present

## 2016-12-07 DIAGNOSIS — M50222 Other cervical disc displacement at C5-C6 level: Secondary | ICD-10-CM | POA: Diagnosis not present

## 2016-12-08 ENCOUNTER — Encounter (HOSPITAL_COMMUNITY): Payer: Medicare Other

## 2016-12-11 ENCOUNTER — Encounter (HOSPITAL_COMMUNITY): Payer: Medicare Other

## 2016-12-11 NOTE — Telephone Encounter (Signed)
Form placed in Dr. Thad Ranger box for review and signature. Hubbard Hartshorn, RN, BSN

## 2016-12-12 ENCOUNTER — Ambulatory Visit (INDEPENDENT_AMBULATORY_CARE_PROVIDER_SITE_OTHER): Payer: Medicare Other | Admitting: Pulmonary Disease

## 2016-12-12 ENCOUNTER — Encounter: Payer: Self-pay | Admitting: Pulmonary Disease

## 2016-12-12 VITALS — BP 122/78 | HR 103 | Ht 64.0 in | Wt 237.4 lb

## 2016-12-12 DIAGNOSIS — J45909 Unspecified asthma, uncomplicated: Secondary | ICD-10-CM | POA: Diagnosis not present

## 2016-12-12 DIAGNOSIS — G4710 Hypersomnia, unspecified: Secondary | ICD-10-CM | POA: Diagnosis not present

## 2016-12-12 DIAGNOSIS — G4752 REM sleep behavior disorder: Secondary | ICD-10-CM | POA: Insufficient documentation

## 2016-12-12 MED ORDER — FUROSEMIDE 40 MG PO TABS: 40.0000 mg | ORAL_TABLET | Freq: Every day | ORAL | 0 refills | Status: DC

## 2016-12-12 MED ORDER — METOPROLOL TARTRATE 25 MG PO TABS: 25.0000 mg | ORAL_TABLET | Freq: Two times a day (BID) | ORAL | 0 refills | Status: DC

## 2016-12-12 NOTE — Assessment & Plan Note (Signed)
Patient has snoring, witnessed apneas, gasping, choking. Sleeps at 3am until 12 noon. Wakes up unrefreshed in am.  She acts out her dreams 2x/month. Naps in pm.   Patient has insomnia since 07/2016, after heart attack.   She is not on opaites or benzos.  She is on disability 2/2 back issues since Oct 2017.   ESS 20.    Plan :   We discussed about the diagnosis of Obstructive Sleep Apnea (OSA) and implications of untreated OSA. We discussed about CPAP and BiPaP as possible treatment options.    We will schedule the patient for a sleep study. Plan for a split-night sleep study. Most likely moderate-severe as she is very symptomatic. She also has a lot of comorbidities. She had bypass in December. Also had provoked pulmonary embolism. She has chronic insomnia which was made worse since having a bypass in October 2017 plus the fact that she is on disability and she  is not working. She ends up falling asleep at 3 in the morning. 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 During the day that she is scheduled  for the lab study, I told her to wake up earlier than usual and not to have any naps in the day so she will fall asleep at around 9-10 PM. Her brother has sleep apnea and uses CPAP. Anticipate no issues with CPAP. She may actually need a BiPAP. We'll need ONO once osa is corrected.    Patient was instructed to call the office if he/she has not heard back from the office 1-2 weeks after the sleep study.   Patient was instructed to call the office if he/she is having issues with the PAP device.   We discussed good sleep hygiene.   Patient was advised not to engage in activities requiring concentration and/or vigilance if he/she is sleepy.  Patient was advised not to drive if he/she is sleepy.

## 2016-12-12 NOTE — Addendum Note (Signed)
Addended by: Katharina Caper, APRIL D on: 12/12/2016 09:34 AM   Modules accepted: Orders
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
## 2016-12-12 NOTE — Assessment & Plan Note (Signed)
Stable. Prn albuterol.  

## 2016-12-12 NOTE — Patient Instructions (Signed)
It was a pleasure taking care of you today!  We will schedule you to have a sleep study to determine if you have sleep apnea.    We will get a lab sleep study.  You will be scheduled to have a lab sleep study in 4-6 weeks.  Someone from the sleep lab will call you in 2-3 days to schedule the study with you.  They usually have cancellations every night so most likely, they will have openings for a lab sleep study next week or so.  We encourage you to do your sleep study then if possible. Please give us a call in a week is no one from the sleep lab calls you in 2-3 days.   If the sleep study is positive, we will order you a CPAP  machine.  Please call the office if you do NOT receive your machine in the next 1-2 weeks.   Please make sure you use your CPAP device everytime you sleep.  We will monitor the usage of your machine per your insurance requirement.  Your insurance company may take the machine from you if you are not using it regularly.   Please clean the mask, tubings, filter, water reservoir with soapy water every week.  Please use distilled water for the water reservoir.   Please call the office or your machine provider (DME company) if you are having issues with the device.    Return to clinic in 6-8 weeks with Dr. De Dios or NP   

## 2016-12-13 ENCOUNTER — Encounter (HOSPITAL_COMMUNITY): Payer: Medicare Other

## 2016-12-13 DIAGNOSIS — E113511 Type 2 diabetes mellitus with proliferative diabetic retinopathy with macular edema, right eye: Secondary | ICD-10-CM | POA: Diagnosis not present

## 2016-12-13 DIAGNOSIS — E113492 Type 2 diabetes mellitus with severe nonproliferative diabetic retinopathy without macular edema, left eye: Secondary | ICD-10-CM | POA: Diagnosis not present

## 2016-12-14 ENCOUNTER — Encounter: Payer: Self-pay | Admitting: Podiatry

## 2016-12-14 ENCOUNTER — Ambulatory Visit (INDEPENDENT_AMBULATORY_CARE_PROVIDER_SITE_OTHER): Payer: Medicare Other | Admitting: Podiatry

## 2016-12-14 VITALS — Resp 16 | Ht 64.0 in | Wt 237.0 lb

## 2016-12-14 DIAGNOSIS — M2041 Other hammer toe(s) (acquired), right foot: Secondary | ICD-10-CM

## 2016-12-14 DIAGNOSIS — I2510 Atherosclerotic heart disease of native coronary artery without angina pectoris: Secondary | ICD-10-CM | POA: Diagnosis not present

## 2016-12-14 DIAGNOSIS — M2042 Other hammer toe(s) (acquired), left foot: Secondary | ICD-10-CM

## 2016-12-14 DIAGNOSIS — Z872 Personal history of diseases of the skin and subcutaneous tissue: Secondary | ICD-10-CM

## 2016-12-14 DIAGNOSIS — E1149 Type 2 diabetes mellitus with other diabetic neurological complication: Secondary | ICD-10-CM | POA: Diagnosis not present

## 2016-12-14 DIAGNOSIS — N183 Chronic kidney disease, stage 3 (moderate): Secondary | ICD-10-CM | POA: Diagnosis not present

## 2016-12-14 NOTE — Progress Notes (Signed)
   Subjective:    Patient ID: Tamara Henry, female    DOB: [birth year], 56 y.o.   MRN: [id number]  HPI  Chief Complaint  Patient presents with  . Diabetes    Diabetic Foot Care; Type 2; Last A1C 1 mo ago 8.9  . Peripheral Neuropathy    BL; "Diagnosed by PCP years ago"; Taking Gabapentin and it helps somewhat.   . Foot Problem    BL; Dry Skin on feet.     56 year old female presents the office today requesting had a she is also for diabetic foot evaluation. She states that he was diagnosed with neuropathy by her primary care physician she is on gabapentin which does help. Chest is dry skin at both of her feet but she denies any skin cracking or bleeding or any sores. Her last A1c was 8.9. She does state that she gets wounds the tops of her toes when they rub on her shoes she's also had a wound on top of her foot before.   Review of Systems  All other systems reviewed and are negative.      Objective:   Physical Exam General: AAO x3, NAD  Dermatological: Nails are mildly hypertrophic, dystrophic. There isany redness or drainage. There is no open lesions identified at this time however there is pre-ulcerative areas the tops of her toes on the PIPJ from digital deformity. No open lesions are identified today.  Vascular: Dorsalis Pedis artery and Posterior Tibial artery pedal pulses are 2/4 bilateral with immedate capillary fill time.  There is no pain with calf compression, swelling, warmth, erythema.   Neruologic: Sensation mildly decreased with Sims once the monofilament, vibratory sensation intact. She has been diagnosed with neuropathy changes on gabapentin.  Musculoskeletal: Hammertoes are present. Decrease in medial arch upon weight bearing present. There is no area of tenderness identified today. There is no amount edema, erythema, increase in warmth. MMT 5/5.  Assessment: 56 year old female with high 2 diabetes with neuropathy with digital deformity is an history of sores to  the tops of her toes requesting diabetic shoes  Plan: -Treatment options discussed including all alternatives, risks, and complications -Etiology of symptoms were discussed -Discussed the importance of daily foot inspection. -Paperwork was completed today for precertification diabetic shoes. She is also molded for diabetic shoes today. We will order these once approvals complete. -Continue gabapentin for neuropathy. Discussed other treatments Including compound creams -Follow-up as scheduled or sooner if needed. Call any questions or concerns meantime.  Celesta Gentile, DPM

## 2016-12-14 NOTE — Telephone Encounter (Signed)
Form signed and placed in "to be faxed" pile

## 2016-12-14 NOTE — Patient Instructions (Signed)

## 2016-12-15 ENCOUNTER — Other Ambulatory Visit: Payer: Medicare Other | Admitting: *Deleted

## 2016-12-15 ENCOUNTER — Encounter (HOSPITAL_COMMUNITY): Payer: Medicare Other

## 2016-12-15 DIAGNOSIS — E7800 Pure hypercholesterolemia, unspecified: Secondary | ICD-10-CM | POA: Diagnosis not present

## 2016-12-15 DIAGNOSIS — I2510 Atherosclerotic heart disease of native coronary artery without angina pectoris: Secondary | ICD-10-CM | POA: Diagnosis not present

## 2016-12-15 LAB — BASIC METABOLIC PANEL
BUN / CREAT RATIO: 18 (ref 9–23)
BUN: 26 mg/dL — AB (ref 6–24)
CO2: 24 mmol/L (ref 18–29)
CREATININE: 1.42 mg/dL — AB (ref 0.57–1.00)
Calcium: 8.6 mg/dL — ABNORMAL LOW (ref 8.7–10.2)
Chloride: 98 mmol/L (ref 96–106)
GFR, EST AFRICAN AMERICAN: 49 mL/min/{1.73_m2} — AB (ref >59)
GFR, EST NON AFRICAN AMERICAN: 42 mL/min/{1.73_m2} — AB (ref >59)
Glucose: 317 mg/dL — ABNORMAL HIGH (ref 65–99)
Potassium: 3.8 mmol/L (ref 3.5–5.2)
SODIUM: 141 mmol/L (ref 134–144)

## 2016-12-15 LAB — HEPATIC FUNCTION PANEL
ALK PHOS: 174 IU/L — AB (ref 39–117)
ALT: 17 IU/L (ref 0–32)
AST: 14 IU/L (ref 0–40)
Albumin: 3.5 g/dL (ref 3.5–5.5)
BILIRUBIN, DIRECT: 0.08 mg/dL (ref 0.00–0.40)
Bilirubin Total: 0.2 mg/dL (ref 0.0–1.2)
Total Protein: 6.9 g/dL (ref 6.0–8.5)

## 2016-12-15 LAB — LIPID PANEL
Chol/HDL Ratio: 5 ratio — ABNORMAL HIGH (ref 0.0–4.4)
Cholesterol, Total: 222 mg/dL — ABNORMAL HIGH (ref 100–199)
HDL: 44 mg/dL (ref >39)
LDL Calculated: 114 mg/dL — ABNORMAL HIGH (ref 0–99)
Triglycerides: 318 mg/dL — ABNORMAL HIGH (ref 0–149)
VLDL Cholesterol Cal: 64 mg/dL — ABNORMAL HIGH (ref 5–40)

## 2016-12-18 ENCOUNTER — Other Ambulatory Visit: Payer: Self-pay | Admitting: Internal Medicine

## 2016-12-18 ENCOUNTER — Other Ambulatory Visit: Payer: Self-pay | Admitting: Family Medicine

## 2016-12-18 ENCOUNTER — Encounter (HOSPITAL_COMMUNITY): Payer: Medicare Other

## 2016-12-18 ENCOUNTER — Other Ambulatory Visit: Payer: Self-pay | Admitting: *Deleted

## 2016-12-18 DIAGNOSIS — N2581 Secondary hyperparathyroidism of renal origin: Secondary | ICD-10-CM | POA: Diagnosis not present

## 2016-12-18 DIAGNOSIS — N183 Chronic kidney disease, stage 3 (moderate): Secondary | ICD-10-CM | POA: Diagnosis not present

## 2016-12-18 DIAGNOSIS — Z1231 Encounter for screening mammogram for malignant neoplasm of breast: Secondary | ICD-10-CM

## 2016-12-18 DIAGNOSIS — E669 Obesity, unspecified: Secondary | ICD-10-CM | POA: Diagnosis not present

## 2016-12-18 DIAGNOSIS — D631 Anemia in chronic kidney disease: Secondary | ICD-10-CM | POA: Diagnosis not present

## 2016-12-18 DIAGNOSIS — I129 Hypertensive chronic kidney disease with stage 1 through stage 4 chronic kidney disease, or unspecified chronic kidney disease: Secondary | ICD-10-CM | POA: Diagnosis not present

## 2016-12-18 MED ORDER — BASAGLAR KWIKPEN 100 UNIT/ML ~~LOC~~ SOPN: 45.0000 [IU] | PEN_INJECTOR | Freq: Every day | SUBCUTANEOUS | 2 refills | Status: DC

## 2016-12-20 ENCOUNTER — Encounter (HOSPITAL_COMMUNITY): Payer: Medicare Other

## 2016-12-21 ENCOUNTER — Telehealth: Payer: Self-pay | Admitting: Family Medicine

## 2016-12-21 NOTE — Telephone Encounter (Signed)
Pt states [employer] has tried to contact us to get her supplies ordered. Pt states they know what she needs and could we please contact them. ep

## 2016-12-22 ENCOUNTER — Encounter (HOSPITAL_COMMUNITY): Payer: Medicare Other

## 2016-12-22 NOTE — Telephone Encounter (Signed)
I have not seen any paperwork from them in my inbox. It could've been sent today however and I did not see it. If it is in my inbox I will fill it out on Monday as I am post call today. [facility] team please let patient know that if there if paperwork I will fill it out Monday for her. Thank you.   Smitty Cords, MD [facility], PGY-2

## 2016-12-25 ENCOUNTER — Encounter (HOSPITAL_COMMUNITY): Payer: Medicare Other

## 2016-12-27 ENCOUNTER — Encounter (HOSPITAL_COMMUNITY): Payer: Medicare Other

## 2016-12-27 ENCOUNTER — Telehealth: Payer: Self-pay | Admitting: *Deleted

## 2016-12-27 NOTE — Telephone Encounter (Signed)
Received fax from [facility] stating patient's insurance will not cover methocarbamol.  Please change to Baclofen or Tizanidine.  Derl Barrow, RN

## 2016-12-28 DIAGNOSIS — F411 Generalized anxiety disorder: Secondary | ICD-10-CM | POA: Diagnosis not present

## 2016-12-28 DIAGNOSIS — F332 Major depressive disorder, recurrent severe without psychotic features: Secondary | ICD-10-CM | POA: Diagnosis not present

## 2016-12-29 ENCOUNTER — Other Ambulatory Visit: Payer: Self-pay | Admitting: Family Medicine

## 2016-12-29 ENCOUNTER — Encounter (HOSPITAL_COMMUNITY): Payer: Medicare Other

## 2016-12-31 MED ORDER — TIZANIDINE HCL 4 MG PO TABS: 4.0000 mg | ORAL_TABLET | Freq: Three times a day (TID) | ORAL | 0 refills | Status: DC | PRN

## 2016-12-31 NOTE — Telephone Encounter (Signed)
Changed to Tizanidine.

## 2017-01-01 ENCOUNTER — Encounter (HOSPITAL_COMMUNITY): Payer: Medicare Other

## 2017-01-03 ENCOUNTER — Ambulatory Visit (INDEPENDENT_AMBULATORY_CARE_PROVIDER_SITE_OTHER): Payer: Medicare Other | Admitting: Orthopaedic Surgery

## 2017-01-03 ENCOUNTER — Encounter (HOSPITAL_COMMUNITY): Payer: Medicare Other

## 2017-01-05 ENCOUNTER — Other Ambulatory Visit: Payer: Self-pay | Admitting: Family Medicine

## 2017-01-05 ENCOUNTER — Ambulatory Visit
Admission: RE | Admit: 2017-01-05 | Discharge: 2017-01-05 | Disposition: A | Discharge status: Home / Self Care | Payer: Medicare Other | Source: Ambulatory Visit | Attending: Family Medicine | Admitting: Family Medicine

## 2017-01-05 ENCOUNTER — Encounter (HOSPITAL_COMMUNITY): Payer: Medicare Other

## 2017-01-05 DIAGNOSIS — Z1231 Encounter for screening mammogram for malignant neoplasm of breast: Secondary | ICD-10-CM

## 2017-01-08 ENCOUNTER — Encounter (HOSPITAL_COMMUNITY): Payer: Medicare Other

## 2017-01-10 ENCOUNTER — Encounter (HOSPITAL_COMMUNITY): Payer: Medicare Other

## 2017-01-10 DIAGNOSIS — H25813 Combined forms of age-related cataract, bilateral: Secondary | ICD-10-CM | POA: Diagnosis not present

## 2017-01-10 DIAGNOSIS — E113492 Type 2 diabetes mellitus with severe nonproliferative diabetic retinopathy without macular edema, left eye: Secondary | ICD-10-CM | POA: Diagnosis not present

## 2017-01-10 DIAGNOSIS — E113511 Type 2 diabetes mellitus with proliferative diabetic retinopathy with macular edema, right eye: Secondary | ICD-10-CM | POA: Diagnosis not present

## 2017-01-11 DIAGNOSIS — F332 Major depressive disorder, recurrent severe without psychotic features: Secondary | ICD-10-CM | POA: Diagnosis not present

## 2017-01-11 DIAGNOSIS — F411 Generalized anxiety disorder: Secondary | ICD-10-CM | POA: Diagnosis not present

## 2017-01-12 ENCOUNTER — Encounter (INDEPENDENT_AMBULATORY_CARE_PROVIDER_SITE_OTHER): Payer: Self-pay | Admitting: Orthopaedic Surgery

## 2017-01-12 ENCOUNTER — Encounter (HOSPITAL_COMMUNITY): Payer: Medicare Other

## 2017-01-12 ENCOUNTER — Ambulatory Visit (INDEPENDENT_AMBULATORY_CARE_PROVIDER_SITE_OTHER): Payer: Medicare Other | Admitting: Orthopaedic Surgery

## 2017-01-12 VITALS — BP 119/72 | HR 78

## 2017-01-12 DIAGNOSIS — I2510 Atherosclerotic heart disease of native coronary artery without angina pectoris: Secondary | ICD-10-CM | POA: Diagnosis not present

## 2017-01-12 DIAGNOSIS — M75122 Complete rotator cuff tear or rupture of left shoulder, not specified as traumatic: Secondary | ICD-10-CM

## 2017-01-12 DIAGNOSIS — M5020 Other cervical disc displacement, unspecified cervical region: Secondary | ICD-10-CM | POA: Diagnosis not present

## 2017-01-12 NOTE — Progress Notes (Signed)
Office Visit Note   Patient: Tamara Henry           Date of Birth: 19-Jul-1961           MRN: [id number] Visit Date: 01/12/2017              Requested by: Carlyle Dolly, MD [place], [place] [address] PCP: Carlyle Dolly, MD   Assessment & Plan: Visit Diagnoses:  1. Complete tear of left rotator cuff   2. Protrusion of cervical intervertebral disc     Plan: She rates her pain is severe when she uses her left arm. She still has constant pain in her neck and MRI scan shows multiple level disc protrusions from C3 to C7 without significant change from MRI scan 2016. MRI scan of her shoulder shows complete tear of the supraspinatus with 6 mm of retraction. She would like proceed with the shoulder arthroscopy rotator cuff repair for the persistent pain and limitation of range of motion of her shoulder and pain with daily activities.  Follow-Up Instructions: No Follow-up on file.   Orders:  No orders of the defined types were placed in this encounter.  No orders of the defined types were placed in this encounter.     Procedures: No procedures performed   Clinical Data: No additional findings.   Subjective: Chief Complaint  Patient presents with  . Left Shoulder - Pain  . Neck - Pain    HPI patient returns with ongoing four-month history of increasing left shoulder pain. She rates the pain is unbearable. Severe pain with reaching with her left arm pain in her shoulder that radiates down to the elbow. Quadruple bypass November 2017. She's had a history of a PE. She states her last visit her cardiologist was very pleased with her progress. MRI scan cervical spine and left shoulder available for review.  Review of Systems 14 point review of systems updated positive for history of bilateral pulmonary embolism, depression, diabetes with neuropathy, CABG 4. Chronic low back pain, hypertension, chronic kidney disease.   Objective: Vital Signs: BP 119/72    Pulse 78   Physical Exam  Constitutional: She is oriented to person, place, and time. She appears well-developed.  HENT:  Head: Normocephalic.  Right Ear: External ear normal.  Left Ear: External ear normal.  Eyes: Pupils are equal, round, and reactive to light.  Neck: No tracheal deviation present. No thyromegaly present.  Cardiovascular: Normal rate.   Pulmonary/Chest: Effort normal.  Abdominal: Soft.  Neurological: She is alert and oriented to person, place, and time.  Skin: Skin is warm and dry.  Psychiatric: She has a normal mood and affect. Her behavior is normal.    Ortho Exam patient has brachial plexus tenderness bilaterally. Positive drop arm test on the left. Negative on the right. Feeding decreased sensation consistent with peripheral neuropathy. Brachioradialis biceps triceps reflex are 2+ and symmetrical. Positive Spurling mild to moderate on left and right. Negative drop arm test the on the right arm. Passive impingement left shoulder negative on the right shoulder. Long head of the biceps is minimally tender.  Specialty Comments:  No specialty comments available.  Imaging: Study Result   CLINICAL DATA:  Fall 2 months ago. Left shoulder pain with weakness and numbness and reduced range of motion.  EXAM: MRI OF THE LEFT SHOULDER WITHOUT CONTRAST  TECHNIQUE: Multiplanar, multisequence MR imaging of the shoulder was performed. No intravenous contrast was administered.  COMPARISON:  Radiographs dated 11/22/2016  FINDINGS: Despite efforts by the technologist and patient, motion artifact is present on today's exam and could not be eliminated. This reduces exam sensitivity and specificity.  Rotator cuff: Large full-thickness partial width tear of the anterior 3/4 of the supraspinatus tendon near its distal attachment. There is not much in the way of retraction, only about 6 mm of retraction observed. The posterior portion of the tendon appears to still  attached to the humerus.  There is moderate infraspinatus and subscapularis tendinopathy with mild supraspinatus tendinopathy. Mild fissuring along the myotendinous junction of the infraspinatus tendon.  Muscles: There is low-level edema in the teres major muscle on image 25/6 and image 7/7.  Biceps long head: Moderate tendinopathy or intrasubstance partial tearing of the long head of the biceps.  Acromioclavicular Joint: Moderate spurring. Mild subcortical marrow edema along with a small amount of fluid in the [facility] joint. Type II acromion. Small amount of fluid in the subacromial subdeltoid bursa and subcoracoid bursa.  Glenohumeral Joint: Moderate degenerative chondral thinning. Mild spurring of both the glenoid and humeral head.  Labrum: Degenerative signal in the superior labrum. Oblique coronally oriented fissure in the chondrolabral junction at the 12 o'clock position, image 8/7 and image 11/6, a component of focal superior labral tear is difficult to exclude.  Bones: No significant extra-articular osseous abnormalities identified.  Other: No supplemental non-categorized findings.  IMPRESSION: 1. Large full-thickness partial width tear of the anterior 3/4 of the supraspinatus tendon near its distal attachment. 6 mm retraction. 2. Moderate infraspinatus and subscapularis tendinopathy with mild supraspinatus tendinopathy. 3. Low-level edema in the teres major muscle, cause uncertain, could be from muscle strain or brachial neuritis. 4. Moderate tendinopathy or intrasubstance partial tearing of the long head of the biceps. 5. Coronally oriented fissure at the chondrolabral junction of the glenoid at the 12 o'clock position, a component of focal superior labral tear is difficult to exclude given this appearance. 6. Moderate degenerative AC joint arthropathy and moderate degenerative glenohumeral arthropathy.   Electronically Signed   By: Van Clines  M.D.   On: 12/08/2016 08:16    Show images for MR Cervical Spine w/o contrast  Study Result   CLINICAL DATA:  Neck pain.  Rule out herniation.  EXAM: MRI CERVICAL SPINE WITHOUT CONTRAST  TECHNIQUE: Multiplanar, multisequence MR imaging of the cervical spine was performed. No intravenous contrast was administered.  COMPARISON:  01/14/2015  FINDINGS: Alignment: Straightening without subluxation  Vertebrae: No fracture, evidence of discitis, or bone lesion.  Cord: Normal signal and morphology.  Posterior Fossa, vertebral arteries, paraspinal tissues: Enlarged nodular thyroid evaluated by sonography 03/25/2015.  Disc levels:  C2-3: Stable and negative for impingement  C3-4: Chronic small central disc protrusion contacting the ventral cord without compression. Patent foramina. Negative facets  C4-5: Small central disc protrusion contacting the ventral cord. Negative facets. Patent foramina  C5-6: Small central disc protrusion without cord impingement. Negative facets. Patent foramina  C6-7: Mild disc narrowing with broad disc bulging. Mild facet spurring. Patent foramina  C7-T1:Unremarkable.  T2-3:  Small disc protrusion without cord impingement.  IMPRESSION: 1. Mild spinal stenosis from C3-4 to C6-7 due to disc bulges and small protrusions. 2. Diffusely patent foramina with mild narrowing on the left at C6-7. 3. T2-3 small disc protrusion without cord impingement.   Electronically Signed   By: Monte Fantasia M.D.   On: 12/07/2016 16:15       PMFS History: Patient Active Problem List   Diagnosis Date Noted  . Hypersomnia 12/12/2016  .  REM behavioral disorder 12/12/2016  . Asthma 12/12/2016  . Flatulence 11/17/2016  . Neck pain 11/17/2016  . CKD (chronic kidney disease), stage III 11/02/2016  . At risk for sleep apnea 11/01/2016  . CAD (coronary artery disease), native coronary artery 10/26/2016  . Encounter for post surgical  wound check 10/23/2016  . Poor social situation 10/13/2016  . Anemia 08/30/2016  . Leg pain 08/30/2016  . Bilateral low back pain without sciatica 08/30/2016  . 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 Bilateral pulmonary embolism ([place]) 05/17/2016  . Hyperlipidemia 05/17/2016  . Depression 05/17/2016  . Diabetic peripheral neuropathy ([place]) 05/17/2016  . Neuritis of upper extremity, C7 01/15/2015  . DM type 2, uncontrolled, with renal complications ([place]) 35/57/3220  . Hypertension 01/14/2015  . Numbness and tingling of right arm 01/14/2015   Past Medical History:  Diagnosis Date  . Acid reflux   . Asthma   . Back pain   . Bilateral pulmonary embolism ([place]) 05/2016  . CAD in native artery    S/P NSTEMI with cath showing severe 3 vessel ASCAD s/p CABGx4 07/28/16.  . Cataracts, bilateral   . CKD (chronic kidney disease), stage III    borderline CKD II-III  . Cocaine abuse    In remission. Stopped using in early 2000's  . Depression   . Diabetes mellitus ([place])   . GERD (gastroesophageal reflux disease)   . Glaucoma   . High cholesterol   . 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 Hx of chest tube placement right   . Hypertension   . Morbid obesity ([place])   . Neuropathy    hands and feet  . Pneumonia   . Postoperative anemia 07/2016  . Stroke [facility]) 2005    Family History  Problem Relation Age of Onset  . Pancreatitis Mother   . Diabetes type II Mother   . CAD Father   . Diabetes type II Father   . Diabetes type II Sister   . Diabetes type II Brother     Past Surgical History:  Procedure Laterality Date  . CARDIAC CATHETERIZATION N/A 07/25/2016   Procedure: Left Heart Cath and Coronary Angiography;  Surgeon: Peter M Martinique, MD;  Location: [place] [facility];  Service: Cardiovascular;  Laterality: N/A;  . CORONARY ARTERY BYPASS GRAFT N/A 07/28/2016   Procedure: CORONARY ARTERY BYPASS GRAFTING (CABG)x4 using left internal mammary and endoscopic harvest of right greater saphenous vein;  Surgeon: Ivin Poot, MD;  Location: [place];  Service: Open Heart Surgery;  Laterality: N/A;  . TEE WITHOUT CARDIOVERSION N/A 07/28/2016   Procedure: TRANSESOPHAGEAL ECHOCARDIOGRAM (TEE);  Surgeon: Ivin Poot, MD;  Location: [place];  Service: Open Heart Surgery;  Laterality: N/A;  . TUBAL LIGATION     Social History   Occupational History  . disabled    Social History Main Topics  . Smoking status: Former Smoker    Quit date: 09/11/2005  . Smokeless tobacco: Never Used  . Alcohol use No  . Drug use: No  . Sexual activity: Yes    Birth control/ protection: Surgical

## 2017-01-15 ENCOUNTER — Encounter (HOSPITAL_COMMUNITY): Payer: Medicare Other

## 2017-01-15 NOTE — Telephone Encounter (Signed)
LVM for pt to call office back to see if she had received her items and if not if she could contact the company and have them send the forms over for the doctor to be completed for her to get these supplies. Katharina Caper, April D, [place]

## 2017-01-16 ENCOUNTER — Other Ambulatory Visit: Payer: Self-pay | Admitting: *Deleted
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
## 2017-01-17 ENCOUNTER — Encounter (HOSPITAL_COMMUNITY): Payer: Medicare Other

## 2017-01-18 MED ORDER — BASAGLAR KWIKPEN 100 UNIT/ML ~~LOC~~ SOPN: 45.0000 [IU] | PEN_INJECTOR | Freq: Every day | SUBCUTANEOUS | 2 refills | Status: DC

## 2017-01-18 MED ORDER — LOPERAMIDE HCL 2 MG PO CAPS: 2.0000 mg | ORAL_CAPSULE | Freq: Every day | ORAL | 0 refills | Status: DC | PRN

## 2017-01-18 MED ORDER — ACETAMINOPHEN ER 650 MG PO TBCR: 650.0000 mg | EXTENDED_RELEASE_TABLET | Freq: Three times a day (TID) | ORAL | 2 refills | Status: DC | PRN

## 2017-01-18 MED ORDER — ATORVASTATIN CALCIUM 80 MG PO TABS: 80.0000 mg | ORAL_TABLET | Freq: Every evening | ORAL | 3 refills | Status: DC

## 2017-01-18 MED ORDER — METOPROLOL TARTRATE 25 MG PO TABS: 25.0000 mg | ORAL_TABLET | Freq: Two times a day (BID) | ORAL | 3 refills | Status: DC

## 2017-01-18 MED ORDER — FERROUS SULFATE 325 (65 FE) MG PO TABS: 325.0000 mg | ORAL_TABLET | Freq: Every day | ORAL | 3 refills | Status: DC

## 2017-01-18 MED ORDER — INSULIN ASPART 100 UNIT/ML ~~LOC~~ SOLN: 5.0000 [IU] | Freq: Two times a day (BID) | SUBCUTANEOUS | 3 refills | Status: DC

## 2017-01-18 MED ORDER — POTASSIUM CHLORIDE CRYS ER 20 MEQ PO TBCR: 10.0000 meq | EXTENDED_RELEASE_TABLET | Freq: Every day | ORAL | 0 refills | Status: DC

## 2017-01-18 MED ORDER — TIZANIDINE HCL 4 MG PO TABS: 4.0000 mg | ORAL_TABLET | Freq: Three times a day (TID) | ORAL | 0 refills | Status: DC | PRN

## 2017-01-18 MED ORDER — PAROXETINE HCL 40 MG PO TABS: 40.0000 mg | ORAL_TABLET | Freq: Every day | ORAL | 0 refills | Status: DC

## 2017-01-18 MED ORDER — GABAPENTIN 300 MG PO CAPS: ORAL_CAPSULE | ORAL | 3 refills | Status: DC

## 2017-01-18 MED ORDER — THERA VITAL M PO TABS: 1.0000 | ORAL_TABLET | Freq: Every day | ORAL | 5 refills | Status: DC

## 2017-01-18 MED ORDER — EZETIMIBE 10 MG PO TABS: 10.0000 mg | ORAL_TABLET | Freq: Every day | ORAL | 2 refills | Status: DC

## 2017-01-18 MED ORDER — RIVAROXABAN 20 MG PO TABS: 20.0000 mg | ORAL_TABLET | Freq: Every morning | ORAL | 0 refills | Status: DC

## 2017-01-18 MED ORDER — POTASSIUM CHLORIDE ER 20 MEQ PO TBCR: 10.0000 meq | EXTENDED_RELEASE_TABLET | Freq: Every day | ORAL | 0 refills | Status: DC

## 2017-01-18 MED ORDER — FUROSEMIDE 40 MG PO TABS: 40.0000 mg | ORAL_TABLET | Freq: Every day | ORAL | 3 refills | Status: DC

## 2017-01-18 MED ORDER — AMLODIPINE BESYLATE 10 MG PO TABS: 10.0000 mg | ORAL_TABLET | Freq: Every day | ORAL | 3 refills | Status: DC

## 2017-01-18 MED ORDER — OMEPRAZOLE 40 MG PO CPDR: 40.0000 mg | DELAYED_RELEASE_CAPSULE | Freq: Every day | ORAL | 3 refills | Status: DC

## 2017-01-18 MED ORDER — ALIGN 4 MG PO CAPS: 1.0000 | ORAL_CAPSULE | Freq: Every day | ORAL | 2 refills | Status: DC

## 2017-01-19 ENCOUNTER — Encounter (HOSPITAL_COMMUNITY): Payer: Medicare Other

## 2017-01-22 ENCOUNTER — Encounter (HOSPITAL_COMMUNITY): Payer: Medicare Other

## 2017-01-24 ENCOUNTER — Encounter (HOSPITAL_COMMUNITY): Payer: Medicare Other

## 2017-01-24 ENCOUNTER — Telehealth: Payer: Self-pay | Admitting: Family Medicine

## 2017-01-24 NOTE — Telephone Encounter (Signed)
Tamara Henry states orders were faxed over for diabetic shoes and is waiting on approval. ep

## 2017-01-25 NOTE — Telephone Encounter (Signed)
Patient does not qualify for diabetic shoes upon chart review. Have contacted company via fax and asked them to please stop sending over diabetic shoes form. Thank you.

## 2017-01-26 ENCOUNTER — Encounter (HOSPITAL_COMMUNITY): Payer: Medicare Other

## 2017-01-29 ENCOUNTER — Encounter (HOSPITAL_COMMUNITY): Payer: Medicare Other

## 2017-01-29 ENCOUNTER — Telehealth: Payer: Self-pay | Admitting: Family Medicine

## 2017-01-29 NOTE — Telephone Encounter (Signed)
Contacted pt to see if she had received the supplies that she was looking to get and she said she had not and I told her that the doctor had not see any paperwork and if she could contact [employer] and have them resend the request for whatever supplies she is referencing.  She said she would contact them and also she wanted to let her PCP know that they have not contacted her yet for the surgery on her rotator cuff. Routing to PCP as an Pharmacist, hospital and also so she can keep an eye out for the forms for supplies. Katharina Caper, April D, [place]

## 2017-01-29 NOTE — Telephone Encounter (Signed)
Pt called and would like [place] to call [employer]. She said that they have been faxing [place] and we are not responding to their request for supplies. She said the number is [phone].She didn't have the exact name of which supplies just that they were supplies. jw

## 2017-01-30 NOTE — Telephone Encounter (Signed)
Spoke to [employer]. They received the fax from [place] on 12/14/2016. They have been trying to reach the pt. They can't send supplies until they speak with her. This has to be done once a month. Spoke to pt and informed her. Pt will call [employer]. Ottis Stain, CMA

## 2017-01-30 NOTE — Telephone Encounter (Signed)
I have 2 different forms for diabetic shoes in my inbox today. [facility] team please call patient and inform her that she does not qualify for diabetic shoes. She has to have either a foot amputation, deformity, ulcer, or poor circulation to qualify according to the forms. Her last diabetic foot exam at our office was normal. Thank you!  Smitty Cords, MD [place], PGY-2

## 2017-01-31 ENCOUNTER — Telehealth (INDEPENDENT_AMBULATORY_CARE_PROVIDER_SITE_OTHER): Payer: Self-pay | Admitting: Orthopedic Surgery

## 2017-01-31 ENCOUNTER — Ambulatory Visit (HOSPITAL_BASED_OUTPATIENT_CLINIC_OR_DEPARTMENT_OTHER): Payer: Medicare Other | Attending: Pulmonary Disease | Admitting: Pulmonary Disease

## 2017-01-31 ENCOUNTER — Encounter (HOSPITAL_COMMUNITY): Payer: Medicare Other

## 2017-01-31 VITALS — Ht 64.0 in | Wt 220.0 lb

## 2017-01-31 DIAGNOSIS — E119 Type 2 diabetes mellitus without complications: Secondary | ICD-10-CM | POA: Insufficient documentation

## 2017-01-31 DIAGNOSIS — G4733 Obstructive sleep apnea (adult) (pediatric): Secondary | ICD-10-CM | POA: Insufficient documentation

## 2017-01-31 DIAGNOSIS — G4761 Periodic limb movement disorder: Secondary | ICD-10-CM | POA: Diagnosis not present

## 2017-01-31 DIAGNOSIS — I10 Essential (primary) hypertension: Secondary | ICD-10-CM | POA: Insufficient documentation

## 2017-01-31 DIAGNOSIS — E669 Obesity, unspecified: Secondary | ICD-10-CM | POA: Diagnosis not present

## 2017-01-31 DIAGNOSIS — G4710 Hypersomnia, unspecified: Secondary | ICD-10-CM | POA: Diagnosis not present

## 2017-01-31 DIAGNOSIS — Z6837 Body mass index (BMI) 37.0-37.9, adult: Secondary | ICD-10-CM | POA: Insufficient documentation

## 2017-01-31 DIAGNOSIS — G4730 Sleep apnea, unspecified: Secondary | ICD-10-CM

## 2017-01-31 MED ORDER — ACETAMINOPHEN-CODEINE #3 300-30 MG PO TABS: 1.0000 | ORAL_TABLET | Freq: Two times a day (BID) | ORAL | 0 refills | Status: DC | PRN

## 2017-01-31 NOTE — Telephone Encounter (Signed)
OK tylenol # 3       20 tablets    1 po bid prn pain . ucall thanks

## 2017-01-31 NOTE — Telephone Encounter (Signed)
Please advise 

## 2017-01-31 NOTE — Telephone Encounter (Signed)
Tamara Henry needs left shoulder surgery for her rotator cuff tear.  We are waiting on surgery clearance from Dr. Radford Pax before we schedule.  Tamara Henry did advise me that Dr. Theodosia Blender office said she would need another appointment before clearance for surgery could be given. She states that she is in a lot of pain and needs some pain medication to get her through until she can have surgery.

## 2017-01-31 NOTE — Telephone Encounter (Signed)
I entered script into system and called into [place].  Patient advised.

## 2017-02-01 ENCOUNTER — Telehealth: Payer: Self-pay | Admitting: *Deleted

## 2017-02-01 ENCOUNTER — Telehealth: Payer: Self-pay

## 2017-02-01 NOTE — Telephone Encounter (Signed)
Patient has scheduled pre-op clearance evaluation 6/5. Will forward to PCP for Xarelto clearance. Forwarded to [employer] as Juluis Rainier.

## 2017-02-01 NOTE — Telephone Encounter (Signed)
Clearance request received from [facility] (Dr. Rodell Perna) for Left Shoulder Arthroscopy with Rotator Cuff Repair. Procedure is to be scheduled pending clearance. Patient is on Xarelto.   To Dr. Radford Pax for cardiac clearance. To [facility] for Xarelto instructions.   Clearance will need to be faxed to [facility] at [phone] attn: Malachy Mood

## 2017-02-01 NOTE — Telephone Encounter (Signed)
Left message for patient to call back and schedule an appointment for cardiac clearance.

## 2017-02-01 NOTE — Telephone Encounter (Signed)
LVM for pt to call the office. If pt calls, please give her the info below. Ottis Stain, CMA

## 2017-02-01 NOTE — Telephone Encounter (Signed)
Pt on Xarelto for hx of bilateral PE. Per protocol ok to hold 24 hours prior to procedure.

## 2017-02-01 NOTE — Telephone Encounter (Signed)
Please have her followup with extender to make sure she is doing well and is stable post CABG 6 months ago to proceed with surgery.  Clearance for holding Xarelto needs to come from PCP as we are not following her PE treatment.

## 2017-02-01 NOTE — Telephone Encounter (Signed)
Pt wants PCP to call her, pt does not understand why she doesn't qualify for diabetic shoes. ep

## 2017-02-01 NOTE — Telephone Encounter (Signed)
Called patient and informed her paperwork for diabetic shoes has cancelled due to no response from PCP. Patient stated that she is not surprised she requested copies be mailed to her to hand deliver.  Copies printed and placed in out going mail today.

## 2017-02-02 ENCOUNTER — Telehealth: Payer: Self-pay | Admitting: Pulmonary Disease

## 2017-02-02 ENCOUNTER — Encounter (HOSPITAL_COMMUNITY): Payer: Medicare Other

## 2017-02-02 DIAGNOSIS — G4710 Hypersomnia, unspecified: Secondary | ICD-10-CM | POA: Diagnosis not present

## 2017-02-02 DIAGNOSIS — G4730 Sleep apnea, unspecified: Secondary | ICD-10-CM | POA: Diagnosis not present

## 2017-02-02 DIAGNOSIS — G4733 Obstructive sleep apnea (adult) (pediatric): Secondary | ICD-10-CM

## 2017-02-02 NOTE — Telephone Encounter (Signed)
Spoke to pt about their sleep study per AD. Pt understood and agreed to the order being placed. The order was placed and they had no further questions. Nothing further is needed  Pt follow up appt was also moved and a new appt was made.

## 2017-02-02 NOTE — Telephone Encounter (Signed)
According to the forms I received, patient must have 1 of the following conditions for diabetic shoes:  Ulcer Deformed feet Poor circulation Sensation abnormality  According to last diabetic foot exam patient did not have any of these things. If she thinks that she may have one of these conditions she is welcome to reschedule a diabetic foot exam to see if she qualifies. White team please inform patient of this. Thank you.   Smitty Cords, MD [place], PGY-2

## 2017-02-02 NOTE — Procedures (Signed)
NAME: Rasa Degrazia DATE OF BIRTH:  20-Jan-1961 MEDICAL RECORD NUMBER [id number]  LOCATION: [facility]  PHYSICIAN: [place]  DATE OF STUDY: 01/31/2017  CLINICAL INFORMATION  Sleep Study Type: Split Night CPAP  Indication for sleep study: Diabetes, Excessive Daytime Sleepiness, Hypertension, Obesity, OSA, Snoring, Witnessed Apneas  Epworth Sleepiness Score: 17   SLEEP STUDY TECHNIQUE  As per the AASM Manual for the Scoring of Sleep and Associated Events v2.3 (April 2016) with a hypopnea requiring 4% desaturations.  The channels recorded and monitored were frontal, central and occipital EEG, electrooculogram (EOG), submentalis EMG (chin), nasal and oral airflow, thoracic and abdominal wall motion, anterior tibialis EMG, snore microphone, electrocardiogram, and pulse oximetry. Continuous positive airway pressure (CPAP) was initiated when the patient met split night criteria and was titrated according to treat sleep-disordered breathing.  MEDICATIONS  Medications self-administered by patient taken the night of the study : N/A. Meds reviewed per chart review done.  RESPIRATORY PARAMETERS  Diagnostic Total AHI (/hr):  88.4 RDI (/hr): 91.7 OA Index (/hr):  7.1 CA Index (/hr):  1.4  REM AHI (/hr):  N/A NREM AHI (/hr):  88.4 Supine AHI (/hr):  89.5 Non-supine AHI (/hr):  60.00  Min O2 Sat (%): 77.00 Mean O2 (%): 91.41 Time below 88% (min): 18.9    Titration Optimal Pressure (cm): 18 AHI at Optimal Pressure (/hr): 0.0 Min O2 at Optimal Pressure (%): 94.0  Supine % at Optimal (%): 100 Sleep % at Optimal (%): 70    SLEEP ARCHITECTURE  The recording time for the entire night was 420.6 minutes. During a baseline period of 211.9 minutes, the patient slept for 127.0 minutes in REM and nonREM, yielding a sleep efficiency of 59.9%. Sleep onset after lights out was 17.5 minutes with a REM latency of N/A minutes. The patient spent 10.24% of the night in stage N1 sleep,  89.76% in stage N2 sleep, 0.00% in stage N3 and 0.00% in REM.  During the titration period of 198.7 minutes, the patient slept for 134.5 minutes in REM and nonREM, yielding a sleep efficiency of 67.7%. Sleep onset after CPAP initiation was 9.1 minutes with a REM latency of 153.5 minutes. The patient spent 13.75% of the night in stage N1 sleep, 82.90% in stage N2 sleep, 0.00% in stage N3 and 3.35% in REM.  CARDIAC DATA  The 2 lead EKG demonstrated sinus rhythm. The mean heart rate was 77.60 beats per minute. Other EKG findings include: None.   LEG MOVEMENT DATA  The total Periodic Limb Movements of Sleep (PLMS) were 354. The PLMS index was 81.22 .  IMPRESSIONS  1. Severe obstructive sleep apnea occurred during the diagnostic portion of the study (AHI = 88.4/hour). Adequate PAP pressure was selected for this patient. 2. No significant central sleep apnea occurred during the diagnostic portion of the study (CAI = 1.4/hour). 3. Moderate oxygen desaturation was noted during the diagnostic portion of the study (Min O2 =77.00%). 4. The patient snored with Soft snoring volume during the diagnostic portion of the study. 5. No cardiac abnormalities were noted during this study. 6. Severe periodic limb movements of sleep occurred during the study. 7.  DIAGNOSIS  1. Severe Obstructive Sleep Apnea (327.23 [G47.33 ICD-10])  RECOMMENDATIONS  1. No optimal settings were obtained for this patient during this sleep study. She was adequate on CPAP 18 cm water but she did not have REM sleep. Suggest trial with autoCPAP therapy 14-20 cm H2O with a Medium  size Resmed Full Face Mask AirFit F20 mask and heated humidification. She will need a 1 month download on these settings. If she is not corrected an auto CPAP, she will need a BiPAP titration study. 2. Avoid alcohol, sedatives and other CNS depressants that may worsen sleep apnea and disrupt normal sleep architecture. 3. Sleep hygiene should be reviewed to assess  factors that may improve sleep quality. 4. Weight management and regular exercise should be initiated or continued 5. Return to the office 4-6 weeks after obtaining cpap machine.  Monica Becton, MD 02/02/2017, 2:49 PM  Pulmonary and Critical Care Pager [phone] After 3 pm or if no answer, call [phone]

## 2017-02-02 NOTE — Telephone Encounter (Signed)
    Please call the pt and tell the pt the LAB SLEEP STUDY  Showed severe  OSA  Pt stops breathing 88   times an hour.     Please order autoCPAP therapy 14-20 cm H2O with a Medium size Resmed Full Face Mask AirFit F20 mask and heated humidification. Patient will need a 1 month download.   Patient needs to be seen by me or any of the NPs/APPs  4-6 weeks after obtaining the cpap machine.  Let me know if you receive this.   Thanks!   J. Shirl Harris, MD 02/02/2017, 2:51 PM                      Suggest trial with autoCPAP therapy 14-20 cm H2O with a Medium size Resmed Full Face Mask AirFit F20 mask and heated humidification. She will need a 1 m

## 2017-02-05 ENCOUNTER — Encounter (HOSPITAL_COMMUNITY): Payer: Medicare Other

## 2017-02-06 ENCOUNTER — Ambulatory Visit (INDEPENDENT_AMBULATORY_CARE_PROVIDER_SITE_OTHER): Payer: Medicare Other | Admitting: Family Medicine

## 2017-02-06 ENCOUNTER — Telehealth: Payer: Self-pay | Admitting: Family Medicine

## 2017-02-06 ENCOUNTER — Encounter: Payer: Self-pay | Admitting: Family Medicine

## 2017-02-06 VITALS — BP 138/70 | HR 81 | Temp 98.3°F | Ht 64.0 in | Wt 246.6 lb

## 2017-02-06 DIAGNOSIS — M75122 Complete rotator cuff tear or rupture of left shoulder, not specified as traumatic: Secondary | ICD-10-CM

## 2017-02-06 DIAGNOSIS — I2510 Atherosclerotic heart disease of native coronary artery without angina pectoris: Secondary | ICD-10-CM | POA: Diagnosis not present

## 2017-02-06 MED ORDER — METHOCARBAMOL 750 MG PO TABS: 750.0000 mg | ORAL_TABLET | Freq: Three times a day (TID) | ORAL | 1 refills | Status: DC | PRN

## 2017-02-06 MED ORDER — TIZANIDINE HCL 4 MG PO TABS: 4.0000 mg | ORAL_TABLET | Freq: Three times a day (TID) | ORAL | 0 refills | Status: DC | PRN

## 2017-02-06 NOTE — Telephone Encounter (Signed)
Pt came back to the office. Dr. Gerlean Ren sent Tizanidine to pt pharmacy. Ottis Stain, CMA

## 2017-02-06 NOTE — Telephone Encounter (Signed)
Prior Authorization received from [facility] for [place]. Formulary preferred is Baclofen or Tizanidine.  Pt must have tried and failed both medications before insurance will cover.  Derl Barrow, RN

## 2017-02-06 NOTE — Telephone Encounter (Signed)
Pt says medicaid and medicare will not cover Robaxin. The pharmacy is [facility]. Please call in something else that will be covered

## 2017-02-06 NOTE — Patient Instructions (Signed)
You may also try Salonpas patches for pain. Please follow up with your Orthopedic physician.  Dr. Gerlean Ren   Rotator Cuff Injury Rotator cuff injury is any type of injury to the set of muscles and tendons that make up the stabilizing unit of your shoulder. This unit holds the ball of your upper arm bone (humerus) in the socket of your shoulder blade (scapula). What are the causes? Injuries to your rotator cuff most commonly come from sports or activities that cause your arm to be moved repeatedly over your head. Examples of this include throwing, weight lifting, swimming, or racquet sports. Long lasting (chronic) irritation of your rotator cuff can cause soreness and swelling (inflammation), bursitis, and eventual damage to your tendons, such as a tear (rupture). What are the signs or symptoms? Acute rotator cuff tear:  Sudden tearing sensation followed by severe pain shooting from your upper shoulder down your arm toward your elbow.  Decreased range of motion of your shoulder because of pain and muscle spasm.  Severe pain.  Inability to raise your arm out to the side because of pain and loss of muscle power (large tears). Chronic rotator cuff tear:  Pain that usually is worse at night and may interfere with sleep.  Gradual weakness and decreased shoulder motion as the pain worsens.  Decreased range of motion. Rotator cuff tendinitis:  Deep ache in your shoulder and the outside upper arm over your shoulder.  Pain that comes on gradually and becomes worse when lifting your arm to the side or turning it inward. How is this diagnosed? Rotator cuff injury is diagnosed through a medical history, physical exam, and imaging exam. The medical history helps determine the type of rotator cuff injury. Your health care provider will look at your injured shoulder, feel the injured area, and ask you to move your shoulder in different positions. X-ray exams typically are done to rule out other causes  of shoulder pain, such as fractures. MRI is the exam of choice for the most severe shoulder injuries because the images show muscles and tendons. How is this treated? Chronic tear:  Medicine for pain, such as acetaminophen or ibuprofen.  Physical therapy and range-of-motion exercises may be helpful in maintaining shoulder function and strength.  Steroid injections into your shoulder joint.  Surgical repair of the rotator cuff if the injury does not heal with noninvasive treatment. Acute tear:  Anti-inflammatory medicines such as ibuprofen and naproxen to help reduce pain and swelling.  A sling to help support your arm and rest your rotator cuff muscles. Long-term use of a sling is not advised. It may cause significant stiffening of the shoulder joint.  Surgery may be considered within a few weeks, especially in younger, active people, to return the shoulder to full function.  Indications for surgical treatment include the following:  Age younger than 11 years.  Rotator cuff tears that are complete.  Physical therapy, rest, and anti-inflammatory medicines have been used for 6-8 weeks, with no improvement.  Employment or sporting activity that requires constant shoulder use. Tendinitis:  Anti-inflammatory medicines such as ibuprofen and naproxen to help reduce pain and swelling.  A sling to help support your arm and rest your rotator cuff muscles. Long-term use of a sling is not advised. It may cause significant stiffening of the shoulder joint.  Severe tendinitis may require:  Steroid injections into your shoulder joint.  Physical therapy.  Surgery. Follow these instructions at home:  Apply ice to your injury:  Put ice  in a plastic bag.  Place a towel between your skin and the bag.  Leave the ice on for 20 minutes, 2-3 times a day.  If you have a shoulder immobilizer (sling and straps), wear it until told otherwise by your health care provider.  You may want to  sleep on several pillows or in a recliner at night to lessen swelling and pain.  Only take over-the-counter or prescription medicines for pain, discomfort, or fever as directed by your health care provider.  Do simple hand squeezing exercises with a soft rubber ball to decrease hand swelling. Contact a health care provider if:  Your shoulder pain increases, or new pain or numbness develops in your arm, hand, or fingers.  Your hand or fingers are colder than your other hand. Get help right away if:  Your arm, hand, or fingers are numb or tingling.  Your arm, hand, or fingers are increasingly swollen and painful, or they turn white or blue. This information is not intended to replace advice given to you by your health care provider. Make sure you discuss any questions you have with your health care provider. Document Released: 08/25/2000 Document Revised: 02/03/2016 Document Reviewed: 04/09/2013 Elsevier Interactive Patient Education  2017 [employer].

## 2017-02-07 ENCOUNTER — Ambulatory Visit: Payer: Self-pay | Admitting: Pulmonary Disease

## 2017-02-07 ENCOUNTER — Encounter (HOSPITAL_COMMUNITY): Payer: Medicare Other

## 2017-02-07 NOTE — Telephone Encounter (Signed)
Pt had an appt with Dr. Gerlean Ren on 02/06/2017. Pt given message below. She said she has been going to the foot center and will have that office fill out the forms. Ottis Stain, CMA

## 2017-02-07 NOTE — Progress Notes (Signed)
Subjective:     Patient ID: Tamara Henry, female   DOB: 1961/01/21, 56 y.o.   MRN: [id number]  HPI Tamara Henry is a 56yo female presenting today for left shoulder pain. Known history of rotator cuff tear documented on MRI from 11/2016. Plans for surgery, however awaiting surgical clearance. Currently receiving Tylenol #3 from her orthopedist, confirmed on [place]. Follows with Dr. Lorin Mercy for orthopedics. Reports quality and location of pain is unchanged, however given the length of time with the pain she is having difficulty tolerating it. ROM is unchanged. Reports she has to sleep sitting up because of the worsened pain when lying down. Does not wish to have further pain medication beyond the Tylenol #3 prescribed by orthopedics, but does request a refill of a muscle relaxer that she has tried in the past with some relief. Former Smoker.   Review of Systems Per HPI    Objective:   Physical Exam  Constitutional: She appears well-developed and well-nourished. No distress.  Cardiovascular: Normal rate and regular rhythm.   No murmur heard. Pulmonary/Chest: Effort normal. No respiratory distress. She has no wheezes.  Musculoskeletal:  Significantly limited ROM of left shoulder with tenderness over shoulder and left trapezius.  Psychiatric: She has a normal mood and affect. Her behavior is normal.      Assessment and Plan:     1. Complete tear of left rotator cuff Initial refill of Robaxin given, however not approved by insurance. Second refill for Tizanidine given. Continue Tylenol #3 as prescribed. Discussed that she should follow up with Orthopedics for further problems with her shoulder since they are managing this issue.

## 2017-02-08 DIAGNOSIS — F332 Major depressive disorder, recurrent severe without psychotic features: Secondary | ICD-10-CM | POA: Diagnosis not present

## 2017-02-09 ENCOUNTER — Encounter (HOSPITAL_COMMUNITY): Payer: Medicare Other

## 2017-02-09 ENCOUNTER — Telehealth: Payer: Self-pay | Admitting: Family Medicine

## 2017-02-09 ENCOUNTER — Telehealth (INDEPENDENT_AMBULATORY_CARE_PROVIDER_SITE_OTHER): Payer: Self-pay | Admitting: Orthopaedic Surgery

## 2017-02-09 ENCOUNTER — Other Ambulatory Visit: Payer: Self-pay | Admitting: *Deleted

## 2017-02-09 ENCOUNTER — Encounter: Payer: Self-pay | Admitting: Cardiology

## 2017-02-09 ENCOUNTER — Encounter: Payer: Self-pay | Admitting: Family Medicine

## 2017-02-09 ENCOUNTER — Ambulatory Visit (INDEPENDENT_AMBULATORY_CARE_PROVIDER_SITE_OTHER): Payer: Medicare Other | Admitting: Cardiology

## 2017-02-09 VITALS — BP 114/68 | HR 82 | Ht 64.0 in | Wt 245.4 lb

## 2017-02-09 DIAGNOSIS — Z01818 Encounter for other preprocedural examination: Secondary | ICD-10-CM

## 2017-02-09 DIAGNOSIS — I2510 Atherosclerotic heart disease of native coronary artery without angina pectoris: Secondary | ICD-10-CM | POA: Diagnosis not present

## 2017-02-09 MED ORDER — FUROSEMIDE 40 MG PO TABS: 40.0000 mg | ORAL_TABLET | Freq: Every day | ORAL | 3 refills | Status: DC

## 2017-02-09 MED ORDER — METOPROLOL TARTRATE 25 MG PO TABS: 25.0000 mg | ORAL_TABLET | Freq: Two times a day (BID) | ORAL | 3 refills | Status: DC

## 2017-02-09 MED ORDER — INSULIN ASPART 100 UNIT/ML ~~LOC~~ SOLN: 5.0000 [IU] | Freq: Two times a day (BID) | SUBCUTANEOUS | 3 refills | Status: DC

## 2017-02-09 MED ORDER — GABAPENTIN 300 MG PO CAPS: ORAL_CAPSULE | ORAL | 3 refills | Status: DC

## 2017-02-09 MED ORDER — OMEPRAZOLE 40 MG PO CPDR: 40.0000 mg | DELAYED_RELEASE_CAPSULE | Freq: Every day | ORAL | 3 refills | Status: DC

## 2017-02-09 MED ORDER — BASAGLAR KWIKPEN 100 UNIT/ML ~~LOC~~ SOPN: 45.0000 [IU] | PEN_INJECTOR | Freq: Every day | SUBCUTANEOUS | 2 refills | Status: DC

## 2017-02-09 NOTE — Telephone Encounter (Signed)
Tried to contact pt and phone only rang with no answer, if pt calls back please inform her of below. Katharina Caper, April D, [place]

## 2017-02-09 NOTE — Telephone Encounter (Signed)
Patient called asked if Dr Lorin Mercy can prescribe Tramadol for her. The number to contact patient is [phone]

## 2017-02-09 NOTE — Telephone Encounter (Signed)
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 Pt would like to get this letter ASAP. Please call pt when it is ready for pick up. ep

## 2017-02-09 NOTE — Progress Notes (Signed)
02/09/2017 Tamara Henry   12-08-1960  [id number]  Primary Physician Carlyle Dolly, MD Primary Cardiologist: Dr. Radford Pax   Reason for Visit/CC: Preoperative Evaluation/ Surgical Clearance   HPI:  Tamara Henry is a 56 y.o. female who is being seen today for preoperative evaluation. She is needing to undergo orthopedic surgery. She has a torn right rotator cuff that needs repair.   She has a history of obesity, insulin-dependent diabetes, hypertension, dyslipidemia, GERD, recently diagnosed bilateral PEs treated with Xarelto therapy. She presented in November 2017 with chest pain and ruled in for non-STEMI. She underwent cardiac catheterization which showed three-vessel CAD and she underwent CABG. Post operative course was also complicated by atrial fibrillation. She was treated with amiodarone. This was discontinued 3 months after her surgery as she had no recurrent A. fib. She was continued on Xarelto however given her PE. This is beening managed by her PCP. She was last seen by Dr. Radford Pax 10/26/2016 and doing well from a cardiac standpoint.  She presents to clinic today for surgical clearance. EKG shows NSR. HR 82 bpm. BP is 114/68. She has done well since her CABG. She denies any recurrent CP or dyspnea. She is able to complete 4 METS of physical activity (walk up a flight of stairs) w/o exertional symptoms. She reports full medication compliance. Physical exam is benign. No syncope/ near syncope.     Current Meds  Medication Sig  . acetaminophen (TYLENOL 8 HOUR ARTHRITIS PAIN) 650 MG CR tablet Take 1 tablet (650 mg total) by mouth every 8 (eight) hours as needed for pain.  Marland Kitchen acetaminophen-codeine (TYLENOL #3) 300-30 MG tablet Take 1 tablet by mouth 2 (two) times daily as needed for moderate pain.  Marland Kitchen amiodarone (PACERONE) 200 MG tablet Take by mouth as directed.   Marland Kitchen amLODipine (NORVASC) 10 MG tablet Take 1 tablet (10 mg total) by mouth daily.  Marland Kitchen aspirin 81 MG EC tablet Take 1 tablet  (81 mg total) by mouth daily.  Marland Kitchen atorvastatin (LIPITOR) 80 MG tablet Take 1 tablet (80 mg total) by mouth every evening.  . ezetimibe (ZETIA) 10 MG tablet Take 1 tablet (10 mg total) by mouth daily.  . ferrous sulfate 325 (65 FE) MG tablet Take 1 tablet (325 mg total) by mouth daily with breakfast.  . furosemide (LASIX) 40 MG tablet Take 1 tablet (40 mg total) by mouth daily.  Marland Kitchen gabapentin (NEURONTIN) 300 MG capsule as directed. Take 900 mg by mouth in the morning, 600 mg at lunch, and 900 mg at bedtime  . insulin aspart (NOVOLOG) 100 UNIT/ML injection Inject 5 Units into the skin 2 (two) times daily before a meal.  . Insulin Glargine (BASAGLAR KWIKPEN) 100 UNIT/ML SOPN Inject 0.45 mLs (45 Units total) into the skin at bedtime.  Marland Kitchen loperamide (IMODIUM) 2 MG capsule Take 1 capsule (2 mg total) by mouth daily as needed for diarrhea or loose stools.  Marland Kitchen MELATONIN PO Take 1 tablet by mouth at bedtime.   . metoprolol tartrate (LOPRESSOR) 25 MG tablet Take 1 tablet (25 mg total) by mouth 2 (two) times daily.  . Multiple Vitamins-Minerals (MULTIVITAMIN) tablet Take 1 tablet by mouth daily.  Marland Kitchen omeprazole (PRILOSEC) 40 MG capsule Take 1 capsule (40 mg total) by mouth daily.  Marland Kitchen PARoxetine (PAXIL) 40 MG tablet Take 1 tablet (40 mg total) by mouth daily with breakfast.  . potassium chloride SA (K-DUR,KLOR-CON) 20 MEQ tablet Take 0.5 tablets (10 mEq total) by mouth daily.  . Probiotic Product (ALIGN)  4 MG CAPS Take 1 capsule (4 mg total) by mouth daily.  . rivaroxaban (XARELTO) 20 MG TABS tablet Take 1 tablet (20 mg total) by mouth every morning.  Marland Kitchen tiZANidine (ZANAFLEX) 4 MG tablet Take 1 tablet (4 mg total) by mouth every 8 (eight) hours as needed for muscle spasms.   Allergies  Allergen Reactions  . Lactose Intolerance (Gi) Diarrhea  . Lisinopril Other (See Comments) and Cough    Inflammation, coughing  . Reglan [Metoclopramide] Other (See Comments)    REACTION IS SIDE EFFECT Pt stated having lock jaw  as a side effect  . Wellbutrin [Bupropion] Nausea And Vomiting and Cough  . Latex Rash  . Metformin And Related Diarrhea  . Penicillins Rash    [FROM PREVIOUS ENTRY-BEFORE 07/27/16] >>"ALL CILLINS"  Has patient had a PCN reaction causing immediate rash, facial/tongue/throat swelling, SOB or lightheadedness with hypotension: Yes Has patient had a PCN reaction causing severe rash involving mucus membranes or skin necrosis: No Has patient had a PCN reaction that required hospitalization No Has patient had a PCN reaction occurring within the last 10 years: Yes If all of the above answers are "NO", then may proceed with Cephalosporin use.   . Sulfa Antibiotics Itching  . Tape Rash   Past Medical History:  Diagnosis Date  . Acid reflux   . Asthma   . Back pain   . Bilateral pulmonary embolism ([place]) 05/2016  . CAD in native artery    S/P NSTEMI with cath showing severe 3 vessel ASCAD s/p CABGx4 07/28/16.  . Cataracts, bilateral   . CKD (chronic kidney disease), stage III    borderline CKD II-III  . Cocaine abuse    In remission. Stopped using in early 2000's  . Depression   . Diabetes mellitus ([place])   . GERD (gastroesophageal reflux disease)   . Glaucoma   . High cholesterol   . 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 Hx of chest tube placement right   . Hypertension   . Morbid obesity ([place])   . Neuropathy    hands and feet  . Pneumonia   . Postoperative anemia 07/2016  . Stroke [facility]) 2005   Family History  Problem Relation Age of Onset  . Pancreatitis Mother   . Diabetes type II Mother   . CAD Father   . Diabetes type II Father   . Diabetes type II Sister   . Diabetes type II Brother    Past Surgical History:  Procedure Laterality Date  . CARDIAC CATHETERIZATION N/A 07/25/2016   Procedure: Left Heart Cath and Coronary Angiography;  Surgeon: Peter M Martinique, MD;  Location: [facility];  Service: Cardiovascular;  Laterality: N/A;  . CORONARY ARTERY BYPASS GRAFT N/A 07/28/2016    Procedure: CORONARY ARTERY BYPASS GRAFTING (CABG)x4 using left internal mammary and endoscopic harvest of right greater saphenous vein;  Surgeon: Ivin Poot, MD;  Location: [facility];  Service: Open Heart Surgery;  Laterality: N/A;  . TEE WITHOUT CARDIOVERSION N/A 07/28/2016   Procedure: TRANSESOPHAGEAL ECHOCARDIOGRAM (TEE);  Surgeon: Ivin Poot, MD;  Location: [facility];  Service: Open Heart Surgery;  Laterality: N/A;  . TUBAL LIGATION     Social History   Social History  . Marital status: Married    Spouse name: N/A  . Number of children: 4  . Years of education: 12   Occupational History  . disabled    Social History Main Topics  . Smoking status: Former Smoker  Quit date: 09/11/2005  . Smokeless tobacco: Never Used  . Alcohol use No  . Drug use: No  . Sexual activity: Yes    Birth control/ protection: Surgical   Other Topics Concern  . Not on file   Social History Narrative   Lives at home with daughter Arley [place]   Drinks no caffeine      Review of Systems: General: negative for chills, fever, night sweats or weight changes.  Cardiovascular: negative for chest pain, dyspnea on exertion, edema, orthopnea, palpitations, paroxysmal nocturnal dyspnea or shortness of breath Dermatological: negative for rash Respiratory: negative for cough or wheezing Urologic: negative for hematuria Abdominal: negative for nausea, vomiting, diarrhea, bright red blood per rectum, melena, or hematemesis Neurologic: negative for visual changes, syncope, or dizziness All other systems reviewed and are otherwise negative except as noted above.   Physical Exam:  Blood pressure 114/68, pulse 82, height 5\' 4"  (1.626 m), weight 245 lb 6.4 oz (111.3 kg), SpO2 97 %.  General appearance: alert, cooperative and no distress Neck: no carotid bruit and no JVD Lungs: clear to auscultation bilaterally Heart: regular rate and rhythm, S1, S2 normal, no murmur, click, rub or gallop Extremities:  extremities normal, atraumatic, no cyanosis or edema Pulses: 2+ and symmetric Skin: Skin color, texture, turgor normal. No rashes or lesions Neurologic: Grossly normal  EKG NSR. No ischemia. -- personally reviewed   ASSESSMENT AND PLAN:   1. CAD: h/o CABG 11/20017. Stable w/o CP or dyspnea. Continue medical therapy.   2. Cardiac Assessment/Clearance: EKG shows NSR. HR 82 bpm. No ischemia. BP is 114/68. She has done well since her CABG. She denies any recurrent CP or dyspnea. She is able to complete 4 METS of physical activity (walk up a flight of stairs) w/o exertional symptoms. She reports full medication compliance. Physical exam is benign. No syncope/ near syncope. She is cleared for orthopedic surgery and is of acceptable risk. There is no need for further cardiac testing. Continue statin and BB during the perioperative period. Per Dr. Radford Pax, Clearance for holding Xarelto needs to come from PCP as we are not following her PE treatment.   Follow-Up in 6 months.   Tamara Henry, MHS [facility] 02/09/2017 2:13 PM

## 2017-02-09 NOTE — Patient Instructions (Addendum)
Medication Instructions:  Your physician recommends that you continue on your current medications as directed. Please refer to the Current Medication list given to you today.   Labwork: None ordered   Testing/Procedures: None ordered   Follow-Up: Your physician wants you to follow-up in: August 2018 with Melina Copa, PA and in February 2019 with Dr. Radford Pax. You will receive a reminder letter in the mail two months in advance. If you don't receive a letter, please call our office to schedule the follow-up appointment.   Any Other Special Instructions Will Be Listed Below (If Applicable). You have been cleared for surgery. We will fax [facility] with your clearance once received.    If you need a refill on your cardiac medications before your next appointment, please call your pharmacy. \

## 2017-02-09 NOTE — Telephone Encounter (Signed)
Please advise 

## 2017-02-09 NOTE — Telephone Encounter (Signed)
Whit team, please ask patient to call her orthopedic office who is doing her surgery for this request. Thank you.

## 2017-02-09 NOTE — Telephone Encounter (Signed)
-----   Message from Carlyle Dolly, MD sent at 02/09/2017  5:04 PM EDT ----- Patient can stop Xarelto 48 hours prior to surgery and resume Xarelto 24 hours after surgery. Thanks!  ----- Message ----- From: Theodoro Parma, RN Sent: 02/01/2017   1:01 PM To: Carlyle Dolly, MD

## 2017-02-12 ENCOUNTER — Encounter (HOSPITAL_COMMUNITY): Payer: Medicare Other

## 2017-02-12 MED ORDER — TRAMADOL HCL 50 MG PO TABS: 50.0000 mg | ORAL_TABLET | Freq: Two times a day (BID) | ORAL | 0 refills | Status: DC | PRN

## 2017-02-12 NOTE — Telephone Encounter (Signed)
Pt informed. Zimmerman Rumple, Jinan Biggins D, CMA  

## 2017-02-12 NOTE — Addendum Note (Signed)
Addended by: Meyer Cory on: 02/12/2017 10:57 AM   Modules accepted: Orders

## 2017-02-12 NOTE — Telephone Encounter (Signed)
Med entered and called to Rachel Bo at [facility]. Patient advised.   Malachy Mood  She states that she got clearance to have her surgery and requests a call to schedule when possible.

## 2017-02-12 NOTE — Telephone Encounter (Signed)
OK to call in ultram # 20   1   PO   BID prn pain. Make sure Malachy Mood received Blue sheet about surgery.

## 2017-02-12 NOTE — Telephone Encounter (Signed)
Pt was informed of below. She also said that she was supposed to be changing providers and asked what steps she needed to do so. I told her she could feel out a form or she could try to [employer] for these changes to see if they could be done over the phone.  Katharina Caper, [place] D, [place]

## 2017-02-13 ENCOUNTER — Telehealth (INDEPENDENT_AMBULATORY_CARE_PROVIDER_SITE_OTHER): Payer: Self-pay | Admitting: Orthopedic Surgery

## 2017-02-13 ENCOUNTER — Ambulatory Visit: Payer: Self-pay | Admitting: Physician Assistant

## 2017-02-13 ENCOUNTER — Other Ambulatory Visit (INDEPENDENT_AMBULATORY_CARE_PROVIDER_SITE_OTHER): Payer: Self-pay | Admitting: Orthopaedic Surgery

## 2017-02-13 DIAGNOSIS — M75122 Complete rotator cuff tear or rupture of left shoulder, not specified as traumatic: Secondary | ICD-10-CM

## 2017-02-13 NOTE — Telephone Encounter (Signed)
Yes , stop for 4 days thanks

## 2017-02-13 NOTE — Telephone Encounter (Signed)
Ms. Thurow is scheduled for left shoulder scope, rotator cuff repair on Monday 02/19/2017 at [place] due to her extensive cardiac history.  She has been cleared by cardiology to proceed with surgery and given the ok to hold Xarelto if needed.  Do you want her stop it for this surgery?  If so, how many days before?

## 2017-02-13 NOTE — Telephone Encounter (Signed)
Surgery is scheduled for Monday 02/19/2017 at [facility].

## 2017-02-13 NOTE — Telephone Encounter (Signed)
I called and left message on Ms. Napierala' voicemail to please call me back to discuss.

## 2017-02-14 ENCOUNTER — Encounter (HOSPITAL_COMMUNITY): Payer: Medicare Other

## 2017-02-14 ENCOUNTER — Other Ambulatory Visit: Payer: Self-pay | Admitting: Family Medicine

## 2017-02-16 ENCOUNTER — Encounter (HOSPITAL_COMMUNITY): Payer: Self-pay | Admitting: *Deleted

## 2017-02-16 ENCOUNTER — Encounter (HOSPITAL_COMMUNITY): Payer: Medicare Other

## 2017-02-16 MED ORDER — CLINDAMYCIN PHOSPHATE 900 MG/50ML IV SOLN
900.0000 mg | INTRAVENOUS | Status: AC
  Administered 2017-02-19: 900 mg via INTRAVENOUS
  Filled 2017-02-16: qty 50

## 2017-02-16 NOTE — Progress Notes (Addendum)
I left a message for Tamara Henry at Dr Lorin Mercy office to confirm that PCP had given permission for patient to stop Xarelto prior to surgery.  Patient had bilateral pulmonary embolisms 05/2016.   Tamara Henry has cardiac clearance from Dr Radford Pax. Tamara Henry has Tyupe II DM, A1C 11/01/16 was 8.9.  I spoke with Tamara Henry and she reported that fasting CBG range in the mid 2  I asked patient to check CBG 4 times a day Sat and Sun and see if she can see if anything she is eating is causing CBGs to be elevated. I stressed that a CBG > 180 increases the chance of infection and decreases healing.   I instructed patient to take 22 units of Basaglar Insulin Sunday evening and if CBG is greater than 220 to take 1/2 of SS Insulin scheduled dose.  I instructed patient to check CBG to check CBG and if it is less than 70 to treat it with  1/2 cup of clear juice like apple juice or cranberry juice. I instructed patient to recheck CBG in 15 minutes and if CBG is not greater than 70, to  Call [phone] (pre- op). If it is before pre-op opens to retreat as before and recheck CBG in 15 minutes. I told patient to make note of time that liquid is taken and amount, that surgical time may have to be adjusted.  Tamara Gianotti, PA came to office while I was speaking to patient and gave instructions that Tamara Henry re ieved from patient's PCP that she can stop Xarelto 2 days before surgery.  Patient takes Xarelto in am, so she is not taking any more until 24 hours after OR.  Patient repeated instructions correctly.

## 2017-02-16 NOTE — Progress Notes (Signed)
Anesthesia Chart Review: Patient is a 56 year old female scheduled for left shoulder arthroscopy with rotator cuff repair on 02/19/17 by Dr. Lorin Mercy.  History includes former smoker (quit '07), CAD/NSTEMI s/p CABG () 07/28/16, post-CABG afib, bilateral PE  05/17/16 (started on Xarelto), DM2, peripheral neuropathy, HTN, HLD, CKD stage II-III, GERD, depression, glaucoma, asthma, cocaine abuse (quit early 2000's), left thyroid nodule (benign follicular nodule 5/42/70). BMI is consistent with morbid obesity.   Meds include amlodipine, aspirin 81 mg, Lipitor 80 mg, Zetia, ferrous sulfate, folic acid, Lasix, gabapentin, NovoLog, insulin glargine, melatonin, Lopressor, Prilosec, Paxil, KCl, Zanaflex, tramadol, Xarelto.    - PCP is listed as Smitty Cords. - Primary cardiologist is Dr. Fransico Him. Last visit on 02/09/17 by Lyda Jester, PA-C for preoperative evaluation. She wrote, "She is cleared for orthopedic surgery and is of acceptable risk. There is no need for further cardiac testing. Continue statin and BB during the perioperative period. Per Dr. Radford Pax, Clearance for holding Xarelto needs to come from PCP as we are not following her PE treatment." (Dr. Lorin Mercy NP Junie Panning spoke with Dr. Juanito Doom and received permission for patient to hold Xarelto for 2 days prior to surgery and resume 24 hours after surgery. Patient instructed to hold Xarelto completes today's dose and will resume 24 hours post-operative per Dr. Lorin Mercy.)  EKG 02/09/17: NSR, rightward axis.  ETT 10/10/16: Study Highlights:  Blood pressure demonstrated a normal response to exercise.  There was no ST segment deviation noted during stress.  No T wave inversion was noted during stress.  Poor exercise tolerance. Inferior Q waves are present at baseline. Although relatively narrow, they appear to be new compared to previous tracings. No evidence for exercise induced ischemia by ECG, but sensitivity is limited by the poor workload.  Echo  07/25/16: Study Conclusions - Left ventricle: The cavity size was normal. Wall thickness was   increased in a pattern of mild LVH. Systolic function was normal.   The estimated ejection fraction was in the range of 55% to 60%.   Wall motion was normal; there were no regional wall motion   abnormalities. Doppler parameters are consistent with abnormal   left ventricular relaxation (grade 1 diastolic dysfunction). - Mitral valve: Calcified annulus. Mildly thickened leaflets . Impressions: - Normal LV systolic function; mild LVH; grade 1 diastolic   dysfunction.  Last cardiac cath was on 07/25/16 (pre-CABG).  Carotid U/S 05/18/16: Summary: Right- no significant extracranial carotid artery stenosis demonstrated. Left - 1% to 39% ICA stenosis lowest end of range. Bilateral - Vertebrals are patent with antegrade flow.  Chest CTA 05/18/16: IMPRESSION: 1. Positive for bilateral lower lobe segmental and subsegmental pulmonary emboli. No evidence of right heart strain. 2. Incidentally detected approximately 2.2 cm heterogeneous left thyroid nodule. This has been previously imaged and biopsied. 3. Coronary artery calcifications. Please note that although the presence of coronary artery calcium documents the presence of coronary artery disease, the severity of this disease and any potential stenosis cannot be assessed on this non-gated CT examination. Assessment for potential risk factor modification, dietary therapy or pharmacologic therapy may be warranted, if clinically indicated.  CXR 10/21/16: IMPRESSION: No active cardiopulmonary disease.  She will need labs prior to surgery. Cr on 12/15/16 was 1.42. She had random glucose readings of 399 (11/24/16) and 317 (406/18). A1c 8.9 on 11/01/16. I notified Cheryl at Dr. Lorin Mercy' office regarding A1c and last two elevated readings. Patient later reported that her fasting CBGs run up to mid 200's. I left a  message for Malachy Mood regarding this since patient's  surgery could get delayed on cancelled if she arrives with a significantly elevated glucose. Patient will get a fasting CBG on arrival.  [facility] Short Stay Center/Anesthesiology Phone [phone] 02/16/2017 3:54 PM

## 2017-02-19 ENCOUNTER — Observation Stay (HOSPITAL_COMMUNITY)
Admission: RE | Admit: 2017-02-19 | Discharge: 2017-02-20 | Disposition: A | Discharge status: Home / Self Care | Payer: Medicare Other | Source: Ambulatory Visit | Attending: Orthopaedic Surgery | Admitting: Orthopaedic Surgery

## 2017-02-19 ENCOUNTER — Encounter (HOSPITAL_COMMUNITY): Payer: Self-pay | Admitting: Anesthesiology

## 2017-02-19 ENCOUNTER — Encounter (HOSPITAL_COMMUNITY)
Admission: RE | Disposition: A | Discharge status: Home / Self Care | Payer: Self-pay | Source: Ambulatory Visit | Attending: Orthopaedic Surgery

## 2017-02-19 ENCOUNTER — Ambulatory Visit (HOSPITAL_COMMUNITY): Payer: Medicare Other | Admitting: Vascular Surgery

## 2017-02-19 ENCOUNTER — Encounter (HOSPITAL_COMMUNITY): Payer: Medicare Other

## 2017-02-19 DIAGNOSIS — I2510 Atherosclerotic heart disease of native coronary artery without angina pectoris: Secondary | ICD-10-CM | POA: Diagnosis not present

## 2017-02-19 DIAGNOSIS — Z87891 Personal history of nicotine dependence: Secondary | ICD-10-CM | POA: Diagnosis not present

## 2017-02-19 DIAGNOSIS — Z86711 Personal history of pulmonary embolism: Secondary | ICD-10-CM | POA: Diagnosis not present

## 2017-02-19 DIAGNOSIS — M75102 Unspecified rotator cuff tear or rupture of left shoulder, not specified as traumatic: Principal | ICD-10-CM | POA: Insufficient documentation

## 2017-02-19 DIAGNOSIS — K219 Gastro-esophageal reflux disease without esophagitis: Secondary | ICD-10-CM | POA: Diagnosis not present

## 2017-02-19 DIAGNOSIS — J45909 Unspecified asthma, uncomplicated: Secondary | ICD-10-CM | POA: Diagnosis not present

## 2017-02-19 DIAGNOSIS — E7800 Pure hypercholesterolemia, unspecified: Secondary | ICD-10-CM | POA: Insufficient documentation

## 2017-02-19 DIAGNOSIS — M75122 Complete rotator cuff tear or rupture of left shoulder, not specified as traumatic: Secondary | ICD-10-CM | POA: Diagnosis not present

## 2017-02-19 DIAGNOSIS — Z7982 Long term (current) use of aspirin: Secondary | ICD-10-CM | POA: Insufficient documentation

## 2017-02-19 DIAGNOSIS — M7542 Impingement syndrome of left shoulder: Secondary | ICD-10-CM | POA: Diagnosis not present

## 2017-02-19 DIAGNOSIS — G8918 Other acute postprocedural pain: Secondary | ICD-10-CM | POA: Diagnosis not present

## 2017-02-19 DIAGNOSIS — Z880 Allergy status to penicillin: Secondary | ICD-10-CM | POA: Diagnosis not present

## 2017-02-19 DIAGNOSIS — N183 Chronic kidney disease, stage 3 (moderate): Secondary | ICD-10-CM | POA: Diagnosis not present

## 2017-02-19 DIAGNOSIS — I129 Hypertensive chronic kidney disease with stage 1 through stage 4 chronic kidney disease, or unspecified chronic kidney disease: Secondary | ICD-10-CM | POA: Diagnosis not present

## 2017-02-19 DIAGNOSIS — E1122 Type 2 diabetes mellitus with diabetic chronic kidney disease: Secondary | ICD-10-CM | POA: Diagnosis not present

## 2017-02-19 DIAGNOSIS — M7522 Bicipital tendinitis, left shoulder: Secondary | ICD-10-CM | POA: Diagnosis not present

## 2017-02-19 DIAGNOSIS — Z951 Presence of aortocoronary bypass graft: Secondary | ICD-10-CM | POA: Diagnosis not present

## 2017-02-19 DIAGNOSIS — Z8673 Personal history of transient ischemic attack (TIA), and cerebral infarction without residual deficits: Secondary | ICD-10-CM | POA: Diagnosis not present

## 2017-02-19 DIAGNOSIS — F319 Bipolar disorder, unspecified: Secondary | ICD-10-CM | POA: Insufficient documentation

## 2017-02-19 DIAGNOSIS — Z794 Long term (current) use of insulin: Secondary | ICD-10-CM | POA: Diagnosis not present

## 2017-02-19 DIAGNOSIS — I10 Essential (primary) hypertension: Secondary | ICD-10-CM | POA: Diagnosis not present

## 2017-02-19 DIAGNOSIS — I252 Old myocardial infarction: Secondary | ICD-10-CM | POA: Diagnosis not present

## 2017-02-19 DIAGNOSIS — Z7901 Long term (current) use of anticoagulants: Secondary | ICD-10-CM | POA: Insufficient documentation

## 2017-02-19 DIAGNOSIS — Z6841 Body Mass Index (BMI) 40.0 and over, adult: Secondary | ICD-10-CM | POA: Diagnosis not present

## 2017-02-19 HISTORY — DX: Bipolar disorder, unspecified: F31.9

## 2017-02-19 HISTORY — DX: Acute myocardial infarction, unspecified: I21.9

## 2017-02-19 HISTORY — DX: Post-traumatic stress disorder, unspecified: F43.10

## 2017-02-19 HISTORY — PX: SHOULDER ARTHROSCOPY WITH ROTATOR CUFF REPAIR: SHX5685

## 2017-02-19 HISTORY — DX: Unspecified osteoarthritis, unspecified site: M19.90

## 2017-02-19 HISTORY — DX: Anxiety disorder, unspecified: F41.9

## 2017-02-19 LAB — BASIC METABOLIC PANEL
ANION GAP: 11 (ref 5–15)
BUN: 30 mg/dL — AB (ref 6–20)
CALCIUM: 9.2 mg/dL (ref 8.9–10.3)
CO2: 21 mmol/L — ABNORMAL LOW (ref 22–32)
Chloride: 104 mmol/L (ref 101–111)
Creatinine, Ser: 1.51 mg/dL — ABNORMAL HIGH (ref 0.44–1.00)
GFR calc Af Amer: 44 mL/min — ABNORMAL LOW (ref >60)
GFR, EST NON AFRICAN AMERICAN: 38 mL/min — AB (ref >60)
Glucose, Bld: 252 mg/dL — ABNORMAL HIGH (ref 65–99)
POTASSIUM: 4 mmol/L (ref 3.5–5.1)
SODIUM: 136 mmol/L (ref 135–145)

## 2017-02-19 LAB — CBC
HEMATOCRIT: 38 % (ref 36.0–46.0)
HEMOGLOBIN: 11.5 g/dL — AB (ref 12.0–15.0)
MCH: 25.4 pg — ABNORMAL LOW (ref 26.0–34.0)
MCHC: 30.3 g/dL (ref 30.0–36.0)
MCV: 83.9 fL (ref 78.0–100.0)
Platelets: 298 10*3/uL (ref 150–400)
RBC: 4.53 MIL/uL (ref 3.87–5.11)
RDW: 16.2 % — AB (ref 11.5–15.5)
WBC: 9.4 10*3/uL (ref 4.0–10.5)

## 2017-02-19 LAB — GLUCOSE, CAPILLARY
GLUCOSE-CAPILLARY: 168 mg/dL — AB (ref 65–99)
GLUCOSE-CAPILLARY: 197 mg/dL — AB (ref 65–99)
Glucose-Capillary: 250 mg/dL — ABNORMAL HIGH (ref 65–99)
Glucose-Capillary: 235 mg/dL — ABNORMAL HIGH (ref 65–99)

## 2017-02-19 LAB — HCG, SERUM, QUALITATIVE: PREG SERUM: NEGATIVE

## 2017-02-19 LAB — PROTIME-INR
INR: 0.98
PROTHROMBIN TIME: 13 s (ref 11.4–15.2)

## 2017-02-19 SURGERY — ARTHROSCOPY, SHOULDER, WITH ROTATOR CUFF REPAIR
Anesthesia: General | Laterality: Left

## 2017-02-19 MED ORDER — HYDROMORPHONE HCL 1 MG/ML IJ SOLN
0.5000 mg | INTRAMUSCULAR | Status: DC | PRN
  Filled 2017-02-19: qty 0.5

## 2017-02-19 MED ORDER — HYDROMORPHONE HCL 1 MG/ML IJ SOLN: 0.2500 mg | INTRAMUSCULAR | Status: DC | PRN

## 2017-02-19 MED ORDER — PHENYLEPHRINE HCL 10 MG/ML IJ SOLN
INTRAMUSCULAR | Status: DC | PRN
  Administered 2017-02-19 (×3): 80 ug via INTRAVENOUS

## 2017-02-19 MED ORDER — LACTATED RINGERS IV SOLN
INTRAVENOUS | Status: DC
  Administered 2017-02-19: 10:00:00 via INTRAVENOUS

## 2017-02-19 MED ORDER — MIDAZOLAM HCL 2 MG/2ML IJ SOLN
2.0000 mg | Freq: Once | INTRAMUSCULAR | Status: AC
  Administered 2017-02-19: 2 mg via INTRAVENOUS

## 2017-02-19 MED ORDER — SODIUM CHLORIDE 0.9 % IV SOLN
INTRAVENOUS | Status: DC | PRN
  Administered 2017-02-19: 25 ug/min via INTRAVENOUS

## 2017-02-19 MED ORDER — POTASSIUM CHLORIDE CRYS ER 10 MEQ PO TBCR
10.0000 meq | EXTENDED_RELEASE_TABLET | Freq: Every day | ORAL | Status: DC
  Administered 2017-02-19 – 2017-02-20 (×2): 10 meq via ORAL
  Filled 2017-02-19 (×2): qty 1

## 2017-02-19 MED ORDER — AMLODIPINE BESYLATE 10 MG PO TABS
10.0000 mg | ORAL_TABLET | Freq: Every day | ORAL | Status: DC
  Administered 2017-02-20: 10 mg via ORAL
  Filled 2017-02-19: qty 1

## 2017-02-19 MED ORDER — LACTATED RINGERS IV SOLN
INTRAVENOUS | Status: DC | PRN
  Administered 2017-02-19: 10:00:00 via INTRAVENOUS

## 2017-02-19 MED ORDER — MENTHOL 3 MG MT LOZG: 1.0000 | LOZENGE | OROMUCOSAL | Status: DC | PRN

## 2017-02-19 MED ORDER — ACETAMINOPHEN 650 MG RE SUPP: 650.0000 mg | Freq: Four times a day (QID) | RECTAL | Status: DC | PRN

## 2017-02-19 MED ORDER — PHENOL 1.4 % MT LIQD: 1.0000 | OROMUCOSAL | Status: DC | PRN

## 2017-02-19 MED ORDER — ROCURONIUM BROMIDE 100 MG/10ML IV SOLN
INTRAVENOUS | Status: DC | PRN
  Administered 2017-02-19: 50 mg via INTRAVENOUS

## 2017-02-19 MED ORDER — LIDOCAINE HCL (CARDIAC) 20 MG/ML IV SOLN
INTRAVENOUS | Status: DC | PRN
  Administered 2017-02-19: 60 mg via INTRAVENOUS

## 2017-02-19 MED ORDER — OXYCODONE HCL 5 MG PO TABS
5.0000 mg | ORAL_TABLET | ORAL | Status: DC | PRN
  Administered 2017-02-19 – 2017-02-20 (×4): 5 mg via ORAL
  Filled 2017-02-19 (×4): qty 1

## 2017-02-19 MED ORDER — ACETAMINOPHEN 325 MG PO TABS: 650.0000 mg | ORAL_TABLET | Freq: Four times a day (QID) | ORAL | Status: DC | PRN

## 2017-02-19 MED ORDER — FENTANYL CITRATE (PF) 100 MCG/2ML IJ SOLN: 50.0000 ug | Freq: Once | INTRAMUSCULAR | Status: DC

## 2017-02-19 MED ORDER — BUPIVACAINE-EPINEPHRINE (PF) 0.25% -1:200000 IJ SOLN
INTRAMUSCULAR | Status: AC
  Filled 2017-02-19: qty 30

## 2017-02-19 MED ORDER — RIVAROXABAN 20 MG PO TABS
20.0000 mg | ORAL_TABLET | Freq: Every morning | ORAL | Status: DC
  Filled 2017-02-19: qty 1

## 2017-02-19 MED ORDER — RIVAROXABAN 20 MG PO TABS: 20.0000 mg | ORAL_TABLET | Freq: Every morning | ORAL | 0 refills | Status: DC

## 2017-02-19 MED ORDER — FENTANYL CITRATE (PF) 100 MCG/2ML IJ SOLN
INTRAMUSCULAR | Status: AC
  Administered 2017-02-19: 100 ug via INTRAVENOUS
  Filled 2017-02-19: qty 2

## 2017-02-19 MED ORDER — OXYCODONE-ACETAMINOPHEN 5-325 MG PO TABS: 1.0000 | ORAL_TABLET | Freq: Four times a day (QID) | ORAL | 0 refills | Status: DC | PRN

## 2017-02-19 MED ORDER — PROPOFOL 10 MG/ML IV BOLUS
INTRAVENOUS | Status: DC | PRN
  Administered 2017-02-19: 150 mg via INTRAVENOUS

## 2017-02-19 MED ORDER — MELATONIN 3 MG PO TABS
6.0000 mg | ORAL_TABLET | Freq: Every day | ORAL | Status: DC
  Administered 2017-02-19: 6 mg via ORAL
  Filled 2017-02-19: qty 2

## 2017-02-19 MED ORDER — SODIUM CHLORIDE 0.9 % IV SOLN: INTRAVENOUS | Status: DC

## 2017-02-19 MED ORDER — FOLIC ACID 1 MG PO TABS
1000.0000 ug | ORAL_TABLET | Freq: Every day | ORAL | Status: DC
  Administered 2017-02-19 – 2017-02-20 (×2): 1 mg via ORAL
  Filled 2017-02-19 (×2): qty 1

## 2017-02-19 MED ORDER — PAROXETINE HCL 20 MG PO TABS
40.0000 mg | ORAL_TABLET | Freq: Every day | ORAL | Status: DC
  Administered 2017-02-20: 40 mg via ORAL
  Filled 2017-02-19: qty 2

## 2017-02-19 MED ORDER — FUROSEMIDE 40 MG PO TABS
40.0000 mg | ORAL_TABLET | Freq: Every day | ORAL | Status: DC
Start: 2017-02-20 — End: 2017-02-20
  Administered 2017-02-20: 40 mg via ORAL
  Filled 2017-02-19: qty 1

## 2017-02-19 MED ORDER — FERROUS SULFATE 325 (65 FE) MG PO TABS
325.0000 mg | ORAL_TABLET | Freq: Every day | ORAL | Status: DC
  Administered 2017-02-20: 325 mg via ORAL
  Filled 2017-02-19: qty 1

## 2017-02-19 MED ORDER — MIDAZOLAM HCL 2 MG/2ML IJ SOLN
INTRAMUSCULAR | Status: AC
  Filled 2017-02-19: qty 2

## 2017-02-19 MED ORDER — CHLORHEXIDINE GLUCONATE 4 % EX LIQD: 60.0000 mL | Freq: Once | CUTANEOUS | Status: DC

## 2017-02-19 MED ORDER — GABAPENTIN 300 MG PO CAPS
600.0000 mg | ORAL_CAPSULE | Freq: Three times a day (TID) | ORAL | Status: DC
  Administered 2017-02-19 – 2017-02-20 (×3): 600 mg via ORAL
  Filled 2017-02-19 (×3): qty 2

## 2017-02-19 MED ORDER — PANTOPRAZOLE SODIUM 40 MG PO TBEC
40.0000 mg | DELAYED_RELEASE_TABLET | Freq: Every day | ORAL | Status: DC
Start: 2017-02-19 — End: 2017-02-20
  Administered 2017-02-19 – 2017-02-20 (×2): 40 mg via ORAL
  Filled 2017-02-19 (×2): qty 1

## 2017-02-19 MED ORDER — FENTANYL CITRATE (PF) 100 MCG/2ML IJ SOLN
INTRAMUSCULAR | Status: AC
  Filled 2017-02-19: qty 2

## 2017-02-19 MED ORDER — EZETIMIBE 10 MG PO TABS
10.0000 mg | ORAL_TABLET | Freq: Every day | ORAL | Status: DC
  Administered 2017-02-20: 10 mg via ORAL
  Filled 2017-02-19: qty 1

## 2017-02-19 MED ORDER — SODIUM CHLORIDE 0.9 % IR SOLN
Status: DC | PRN
  Administered 2017-02-19: 7000 mL

## 2017-02-19 MED ORDER — ASPIRIN EC 81 MG PO TBEC
81.0000 mg | DELAYED_RELEASE_TABLET | Freq: Every day | ORAL | Status: DC
  Administered 2017-02-20: 81 mg via ORAL
  Filled 2017-02-19: qty 1

## 2017-02-19 MED ORDER — BUPIVACAINE-EPINEPHRINE 0.25% -1:200000 IJ SOLN
INTRAMUSCULAR | Status: DC | PRN
  Administered 2017-02-19: 17 mL

## 2017-02-19 MED ORDER — METHOCARBAMOL 1000 MG/10ML IJ SOLN: 500.0000 mg | Freq: Four times a day (QID) | INTRAVENOUS | Status: DC | PRN

## 2017-02-19 MED ORDER — ONDANSETRON HCL 4 MG/2ML IJ SOLN: 4.0000 mg | Freq: Four times a day (QID) | INTRAMUSCULAR | Status: DC | PRN

## 2017-02-19 MED ORDER — SUGAMMADEX SODIUM 500 MG/5ML IV SOLN
INTRAVENOUS | Status: DC | PRN
  Administered 2017-02-19: 445.2 mg via INTRAVENOUS

## 2017-02-19 MED ORDER — FENTANYL CITRATE (PF) 100 MCG/2ML IJ SOLN
INTRAMUSCULAR | Status: DC | PRN
  Administered 2017-02-19 (×2): 50 ug via INTRAVENOUS

## 2017-02-19 MED ORDER — ONDANSETRON HCL 4 MG PO TABS: 4.0000 mg | ORAL_TABLET | Freq: Four times a day (QID) | ORAL | Status: DC | PRN

## 2017-02-19 MED ORDER — HYDROMORPHONE HCL 1 MG/ML IJ SOLN
INTRAMUSCULAR | Status: AC
  Filled 2017-02-19: qty 0.5

## 2017-02-19 MED ORDER — METOPROLOL TARTRATE 25 MG PO TABS
25.0000 mg | ORAL_TABLET | Freq: Two times a day (BID) | ORAL | Status: DC
  Administered 2017-02-19 – 2017-02-20 (×2): 25 mg via ORAL
  Filled 2017-02-19 (×2): qty 1

## 2017-02-19 MED ORDER — PROPOFOL 10 MG/ML IV BOLUS
INTRAVENOUS | Status: AC
  Filled 2017-02-19: qty 20

## 2017-02-19 MED ORDER — ATORVASTATIN CALCIUM 20 MG PO TABS
80.0000 mg | ORAL_TABLET | Freq: Every evening | ORAL | Status: DC
  Administered 2017-02-19: 80 mg via ORAL
  Filled 2017-02-19: qty 4

## 2017-02-19 MED ORDER — FENTANYL CITRATE (PF) 250 MCG/5ML IJ SOLN
INTRAMUSCULAR | Status: AC
  Filled 2017-02-19: qty 5

## 2017-02-19 MED ORDER — DOCUSATE SODIUM 100 MG PO CAPS
100.0000 mg | ORAL_CAPSULE | Freq: Two times a day (BID) | ORAL | Status: DC
  Administered 2017-02-19: 100 mg via ORAL
  Filled 2017-02-19: qty 1

## 2017-02-19 MED ORDER — INSULIN ASPART 100 UNIT/ML ~~LOC~~ SOLN
5.0000 [IU] | Freq: Two times a day (BID) | SUBCUTANEOUS | Status: DC
  Administered 2017-02-19 – 2017-02-20 (×2): 5 [IU] via SUBCUTANEOUS

## 2017-02-19 MED ORDER — METHOCARBAMOL 500 MG PO TABS
500.0000 mg | ORAL_TABLET | Freq: Four times a day (QID) | ORAL | Status: DC | PRN
  Administered 2017-02-19 – 2017-02-20 (×3): 500 mg via ORAL
  Filled 2017-02-19 (×3): qty 1

## 2017-02-19 MED ORDER — INSULIN GLARGINE 100 UNIT/ML ~~LOC~~ SOLN
45.0000 [IU] | Freq: Every day | SUBCUTANEOUS | Status: DC
  Administered 2017-02-19: 45 [IU] via SUBCUTANEOUS
  Filled 2017-02-19: qty 0.45

## 2017-02-19 SURGICAL SUPPLY — 57 items
BENZOIN TINCTURE PRP APPL 2/3 (GAUZE/BANDAGES/DRESSINGS) ×3 IMPLANT
BLADE CUTTER GATOR 3.5 (BLADE) IMPLANT
BLADE GREAT WHITE 4.2 (BLADE) ×2 IMPLANT
BLADE GREAT WHITE 4.2MM (BLADE) ×1
BLADE SURG 11 STRL SS (BLADE) ×3 IMPLANT
CANNULA ACUFLEX KIT 5X76 (CANNULA) ×3 IMPLANT
CLOSURE WOUND 1/2 X4 (GAUZE/BANDAGES/DRESSINGS) ×1
COVER SURGICAL LIGHT HANDLE (MISCELLANEOUS) ×3 IMPLANT
DRAPE STERI 35X30 U-POUCH (DRAPES) ×3 IMPLANT
DRAPE U-SHAPE 47X51 STRL (DRAPES) ×3 IMPLANT
DRSG PAD ABDOMINAL 8X10 ST (GAUZE/BANDAGES/DRESSINGS) ×9 IMPLANT
DURAPREP 26ML APPLICATOR (WOUND CARE) ×3 IMPLANT
ELECT REM PT RETURN 9FT ADLT (ELECTROSURGICAL) ×3
ELECTRODE REM PT RTRN 9FT ADLT (ELECTROSURGICAL) ×1 IMPLANT
GAUZE SPONGE 4X4 12PLY STRL (GAUZE/BANDAGES/DRESSINGS) ×3 IMPLANT
GAUZE SPONGE 4X4 12PLY STRL LF (GAUZE/BANDAGES/DRESSINGS) ×3 IMPLANT
GAUZE XEROFORM 1X8 LF (GAUZE/BANDAGES/DRESSINGS) ×3 IMPLANT
GLOVE BIOGEL PI IND STRL 8 (GLOVE) ×2 IMPLANT
GLOVE BIOGEL PI INDICATOR 8 (GLOVE) ×4
GLOVE ORTHO TXT STRL SZ7.5 (GLOVE) ×6 IMPLANT
GOWN STRL REUS W/ TWL LRG LVL3 (GOWN DISPOSABLE) ×1 IMPLANT
GOWN STRL REUS W/ TWL XL LVL3 (GOWN DISPOSABLE) ×1 IMPLANT
GOWN STRL REUS W/TWL 2XL LVL3 (GOWN DISPOSABLE) ×3 IMPLANT
GOWN STRL REUS W/TWL LRG LVL3 (GOWN DISPOSABLE) ×2
GOWN STRL REUS W/TWL XL LVL3 (GOWN DISPOSABLE) ×2
KIT BASIN OR (CUSTOM PROCEDURE TRAY) ×3 IMPLANT
KIT ROOM TURNOVER OR (KITS) ×3 IMPLANT
MANIFOLD NEPTUNE II (INSTRUMENTS) ×3 IMPLANT
NDL SUT 6 .5 CRC .975X.05 MAYO (NEEDLE) IMPLANT
NEEDLE HYPO 25X1 1.5 SAFETY (NEEDLE) ×3 IMPLANT
NEEDLE MAYO TAPER (NEEDLE)
NEEDLE SPNL 18GX3.5 QUINCKE PK (NEEDLE) ×3 IMPLANT
NS IRRIG 1000ML POUR BTL (IV SOLUTION) ×3 IMPLANT
PACK SHOULDER (CUSTOM PROCEDURE TRAY) ×3 IMPLANT
PAD ABD 8X10 STRL (GAUZE/BANDAGES/DRESSINGS) ×3 IMPLANT
PAD ARMBOARD 7.5X6 YLW CONV (MISCELLANEOUS) ×6 IMPLANT
SET ARTHROSCOPY TUBING (MISCELLANEOUS) ×2
SET ARTHROSCOPY TUBING LN (MISCELLANEOUS) ×1 IMPLANT
SLING ARM IMMOBILIZER LRG (SOFTGOODS) ×3 IMPLANT
SPONGE LAP 4X18 X RAY DECT (DISPOSABLE) ×6 IMPLANT
STRIP CLOSURE SKIN 1/2X4 (GAUZE/BANDAGES/DRESSINGS) ×2 IMPLANT
SUCTION FRAZIER HANDLE 10FR (MISCELLANEOUS) ×2
SUCTION TUBE FRAZIER 10FR DISP (MISCELLANEOUS) ×1 IMPLANT
SUT ANCHOR ULTRAFIX RC (Orthopedic Implant) ×6 IMPLANT
SUT ETHILON 3 0 PS 1 (SUTURE) ×3 IMPLANT
SUT ETHILON 4 0 PS 2 18 (SUTURE) ×3 IMPLANT
SUT VIC AB 0 CT2 27 (SUTURE) IMPLANT
SUT VIC AB 2-0 CT1 27 (SUTURE) ×2
SUT VIC AB 2-0 CT1 TAPERPNT 27 (SUTURE) ×1 IMPLANT
SUT VIC AB 3-0 CT1 27 (SUTURE) ×2
SUT VIC AB 3-0 CT1 TAPERPNT 27 (SUTURE) ×1 IMPLANT
SUT VICRYL 4-0 PS2 18IN ABS (SUTURE) ×3 IMPLANT
TAPE CLOTH SURG 6X10 WHT LF (GAUZE/BANDAGES/DRESSINGS) ×3 IMPLANT
TOWEL OR 17X24 6PK STRL BLUE (TOWEL DISPOSABLE) ×3 IMPLANT
TOWEL OR 17X26 10 PK STRL BLUE (TOWEL DISPOSABLE) ×3 IMPLANT
WAND HAND CNTRL MULTIVAC 90 (MISCELLANEOUS) IMPLANT
WATER STERILE IRR 1000ML POUR (IV SOLUTION) ×3 IMPLANT

## 2017-02-19 NOTE — Anesthesia Preprocedure Evaluation (Addendum)
Anesthesia Evaluation  Patient identified by MRN, date of birth, ID band Patient awake    Reviewed: Allergy & Precautions, H&P , NPO status , Patient's Chart, lab work & pertinent test results  Airway Mallampati: II  TM Distance: >3 FB Neck ROM: Full    Dental no notable dental hx. (+) Poor Dentition, Dental Advisory Given   Pulmonary asthma , sleep apnea , former smoker,    Pulmonary exam normal breath sounds clear to auscultation       Cardiovascular hypertension, Pt. on medications and Pt. on home beta blockers + CAD   Rhythm:Regular Rate:Normal     Neuro/Psych Anxiety Depression Bipolar Disorder CVA    GI/Hepatic Neg liver ROS, GERD  Medicated and Controlled,  Endo/Other  diabetes, Insulin DependentMorbid obesity  Renal/GU Renal disease  negative genitourinary   Musculoskeletal   Abdominal   Peds  Hematology negative hematology ROS (+)   Anesthesia Other Findings   Reproductive/Obstetrics negative OB ROS                            Anesthesia Physical Anesthesia Plan  ASA: III  Anesthesia Plan: General   Post-op Pain Management:  Regional for Post-op pain   Induction: Intravenous  PONV Risk Score and Plan: 3 and Ondansetron, Dexamethasone, Propofol and Midazolam  Airway Management Planned: Oral ETT  Additional Equipment:   Intra-op Plan:   Post-operative Plan: Extubation in OR  Informed Consent: I have reviewed the patients History and Physical, chart, labs and discussed the procedure including the risks, benefits and alternatives for the proposed anesthesia with the patient or authorized representative who has indicated his/her understanding and acceptance.   Dental advisory given  Plan Discussed with: CRNA  Anesthesia Plan Comments:        Anesthesia Quick Evaluation

## 2017-02-19 NOTE — Interval H&P Note (Signed)
History and Physical Interval Note:  02/19/2017 9:59 AM  Tamara Henry  has presented today for surgery, with the diagnosis of Rotator Cuff Tear Left Shoulder  The various methods of treatment have been discussed with the patient and family. After consideration of risks, benefits and other options for treatment, the patient has consented to  Procedure(s): LEFT SHOULDER ARTHROSCOPY WITH ROTATOR CUFF REPAIR (Left) as a surgical intervention .  The patient's history has been reviewed, patient examined, no change in status, stable for surgery.  I have reviewed the patient's chart and labs.  Questions were answered to the patient's satisfaction.     [name]

## 2017-02-19 NOTE — Anesthesia Procedure Notes (Signed)
Anesthesia Regional Block: Interscalene brachial plexus block   Pre-Anesthetic Checklist: ,, timeout performed, Correct Patient, Correct Site, Correct Laterality, Correct Procedure, Correct Position, site marked, Risks and benefits discussed, pre-op evaluation,  At surgeon's request and post-op pain management  Laterality: Left  Prep: Maximum Sterile Barrier Precautions used, chloraprep       Needles:  Injection technique: Single-shot  Needle Type: Echogenic Stimulator Needle     Needle Length: 5cm  Needle Gauge: 22     Additional Needles:   Procedures: ultrasound guided, nerve stimulator,,,,,,   Nerve Stimulator or Paresthesia:  Response: Biceps response,   Additional Responses:   Narrative:  Start time: 02/19/2017 9:32 AM End time: 02/19/2017 9:42 AM Injection made incrementally with aspirations every 5 mL. Anesthesiologist: Roderic Palau  Additional Notes: 2% Lidocaine skin wheel.

## 2017-02-19 NOTE — Anesthesia Procedure Notes (Signed)
Procedure Name: Intubation Date/Time: 02/19/2017 10:16 AM Performed by: Manus Gunning, Shah Insley J Pre-anesthesia Checklist: Patient identified, Suction available, Emergency Drugs available, Patient being monitored and Timeout performed Patient Re-evaluated:Patient Re-evaluated prior to inductionOxygen Delivery Method: Circle system utilized Preoxygenation: Pre-oxygenation with 100% oxygen Intubation Type: IV induction Ventilation: Mask ventilation without difficulty Laryngoscope Size: Mac and 3 Grade View: Grade II Tube type: Oral Tube size: 7.5 mm Number of attempts: 1 Airway Equipment and Method: Stylet Placement Confirmation: ETT inserted through vocal cords under direct vision,  positive ETCO2 and breath sounds checked- equal and bilateral Secured at: 22 cm Tube secured with: Tape Dental Injury: Teeth and Oropharynx as per pre-operative assessment

## 2017-02-19 NOTE — Anesthesia Postprocedure Evaluation (Signed)
Anesthesia Post Note  Patient: Tamara Henry  Procedure(s) Performed: Procedure(s) (LRB): LEFT SHOULDER ARTHROSCOPY with debridement andROTATOR CUFF REPAIR (Left)     Patient location during evaluation: PACU Anesthesia Type: General and Regional Level of consciousness: awake and alert Pain management: pain level controlled Vital Signs Assessment: post-procedure vital signs reviewed and stable Respiratory status: spontaneous breathing, nonlabored ventilation and respiratory function stable Cardiovascular status: blood pressure returned to baseline and stable Postop Assessment: no signs of nausea or vomiting Anesthetic complications: no    Last Vitals:  Vitals:   02/19/17 1241 02/19/17 1244  BP: (!) 160/76   Pulse: 74 78  Resp: 10 (!) 21  Temp:  36.2 C    Last Pain:  Vitals:   02/19/17 1238  PainSc: Asleep                 Xayne Brumbaugh,W. EDMOND

## 2017-02-19 NOTE — Transfer of Care (Signed)
Immediate Anesthesia Transfer of Care Note  Patient: Tamara Henry  Procedure(s) Performed: Procedure(s): LEFT SHOULDER ARTHROSCOPY with debridement andROTATOR CUFF REPAIR (Left)  Patient Location: PACU  Anesthesia Type:GA combined with regional for post-op pain  Level of Consciousness: awake, alert  and oriented  Airway & Oxygen Therapy: Patient Spontanous Breathing and Patient connected to nasal cannula oxygen  Post-op Assessment: Report given to RN and Post -op Vital signs reviewed and stable  Post vital signs: Reviewed and stable  Last Vitals:  Vitals:   02/19/17 0945 02/19/17 0950  BP: (!) 154/66 136/67  Pulse: 87 90  Resp: (!) 0 (!) 22  Temp:      Last Pain: There were no vitals filed for this visit.       Complications: No apparent anesthesia complications

## 2017-02-19 NOTE — Brief Op Note (Signed)
02/19/2017  11:27 AM  PATIENT:  Tamara Henry  57 y.o. female  PRE-OPERATIVE DIAGNOSIS:  Rotator Cuff Tear Left Shoulder  POST-OPERATIVE DIAGNOSIS:  left rotator cuff tear  PROCEDURE:  Procedure(s): LEFT SHOULDER ARTHROSCOPY with debridement andROTATOR CUFF REPAIR (Left)  SURGEON:  Surgeon(s) and Role:    * [name], MD - Primary  PHYSICIAN ASSISTANT: none.  ASSISTANTS: none   ANESTHESIA:   local, regional and general  EBL:  No intake/output data recorded.  BLOOD ADMINISTERED:none  DRAINS: none   LOCAL MEDICATIONS USED:  MARCAINE     SPECIMEN:  No Specimen  DISPOSITION OF SPECIMEN:  N/A  COUNTS:  YES  TOURNIQUET:  * No tourniquets in log *  DICTATION: .Dragon Dictation  PLAN OF CARE: Discharge to home after PACU  PATIENT DISPOSITION:  PACU - hemodynamically stable.   Delay start of Pharmacological VTE agent (>24hrs) due to surgical blood loss or risk of bleeding: yes

## 2017-02-19 NOTE — Progress Notes (Signed)
Set pt up on Cpap for night time rest 8.0 CMH20 tolerating well. Full Face Mask

## 2017-02-19 NOTE — Op Note (Signed)
Preop diagnosis: Left shoulder painful supraspinatus tear with retraction. Postop diagnosis same  Procedure: left shoulder arthroscopy with arthroscopic debridement of the degenerative superior labrum , limited debridement of intra-articular biceps tendon rotator cuff debridement, open supraspinatus repair with to UltraFix anchors.  Surgeon: Rodell Perna M.D.  Anesthesia preoperative interscalene block plus general anesthesia +17 mL Marcaine local  EBL: Per anesthetic record  Findings 43mm retracted full-thickness supraspinatus tear. 20% biceps tendon intra-articular tearing. Type II acromion.  Procedure after standard prepping and draping with patient beachchair position preoperative clindamycin DuraPrep was used timeout procedure was completed usual arthroscopic sheets drapes impervious stockinette Coban, arthroscopic pouch were applied. Scope was introduced from posterior approach anterior portal was made and for the biceps tendon and the shunt shoulder joint was sequentially inspected. There was tearing the superior labrum without a complete SLAP tear. Dissection intra-articular showed abrasion. Full-thickness rotator cuff tear was visualized at the supraspinatus lateral to the biceps tendon exit out of the joint. There was some chondromalacia with a central area the glenoid had some grade 4 changes but no flap tears present. Humeral head was rotated and inspected. There was no Hill-Sachs lesion no Bankart lesion. Anterior inferior labrum was intact. 4.2 great white shaver was introduced through the blue cannula anteriorly which was positioned in for the biceps tendon the superior labrum was debrided. Area where the biceps tendon had some partial tearing and abrasion was: Been smooth. There is a percent intact tendon it was felt that the biceps debridement or tenodesis was not needed. Rotator cuff undersurface was trimmed and debrided. Repeat inspection of the joint thoroughly and then the shoulder  joint was suctioned dry cannulas were removed. Incision was made along the anterior aspect of the acromion deltoid was peeled off. Type II anterior spur was removed. Full-thickness rotator cuff tear was visualized and a partial bursectomy was performed. Bare Ronjair was used to wrap roughen up the greater tuberosity where the rotator cuff had been attached. Supraspinatus tendon was cut back to 2 mm and UltraFix anchors 2 were placed using the trocar hammer and then placing UltraFix anchors with good securement. Sutures were passed through the supraspinatus tendon advancing it back to the greater tuberosity securing it directly down to the freshened bony surface. Shoulder was rotated. Distal aspect the clavicles palpated and was secure. Undersurface of the acromion after spur removal was smooth and did not require rasping. Deltoid was repaired back to holes in the bone made with the UltraFix needles anatomically repairing the deltoid. 1 cm deltoid split was repaired with figure-of-eight suture. 2-0 Vicryl subtendinous tissue. 3-0 Vicryl subarticular closure Krislynn Gronau infiltration in the joint subcutaneous tissue and subacromial space. Tincture benzoin Steri-Strips 4 x 4's ABDs and tape and postoperative sling was applied. Patient tolerated procedure well office follow-up 1 week.

## 2017-02-19 NOTE — H&P (Signed)
Tamara Henry is an 56 y.o. female.   Chief Complaint: left shoulder pain HPI:  Patient with hx of left shoulder impingement and rotator cuff tear presents for surgical intervention.  Progressively worsening pain. Failed conservative treatment.    Past Medical History:  Diagnosis Date  . Acid reflux   . Anxiety   . Arthritis    back  . Asthma   . Back pain   . Bilateral pulmonary embolism ([place]) 05/2016  . Bipolar disorder ([place])   . CAD in native artery    S/P NSTEMI with cath showing severe 3 vessel ASCAD s/p CABGx4 07/28/16.  . Cataracts, bilateral   . CKD (chronic kidney disease), stage III    borderline CKD II-III  . Cocaine abuse    In remission. Stopped using in early 2000's  . Depression   . Diabetes mellitus ([place])    Type II  . GERD (gastroesophageal reflux disease)   . Glaucoma   . High cholesterol   . History of blood transfusion   . 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 Hx of chest tube placement right   . Hypertension   . Morbid obesity ([place])   . Myocardial infarction ([place])   . Neuropathy    hands and feet  . Pneumonia   . Postoperative anemia 07/2016  . PTSD (post-traumatic stress disorder)   . Stroke [facility]) 2005   no residual    Past Surgical History:  Procedure Laterality Date  . CARDIAC CATHETERIZATION N/A 07/25/2016   Procedure: Left Heart Cath and Coronary Angiography;  Surgeon: Peter M Martinique, MD;  Location: [place] [facility];  Service: Cardiovascular;  Laterality: N/A;  . COLONOSCOPY W/ POLYPECTOMY    . CORONARY ARTERY BYPASS GRAFT N/A 07/28/2016   Procedure: CORONARY ARTERY BYPASS GRAFTING (CABG)x4 using left internal mammary and endoscopic harvest of right greater saphenous vein;  Surgeon: Ivin Poot, MD;  Location: [place];  Service: Open Heart Surgery;  Laterality: N/A;  . DENTAL SURGERY     to removed broken teeth  . TEE WITHOUT CARDIOVERSION N/A 07/28/2016   Procedure: TRANSESOPHAGEAL ECHOCARDIOGRAM (TEE);  Surgeon: Ivin Poot, MD;  Location: [place];   Service: Open Heart Surgery;  Laterality: N/A;  . TUBAL LIGATION      Family History  Problem Relation Age of Onset  . Pancreatitis Mother   . Diabetes type II Mother   . CAD Father   . Diabetes type II Father   . Diabetes type II Sister   . Diabetes type II Brother    Social History:  reports that she quit smoking about 11 years ago. She has never used smokeless tobacco. She reports that she does not drink alcohol or use drugs.  Allergies:  Allergies  Allergen Reactions  . Lactose Intolerance (Gi) Diarrhea  . Lisinopril Other (See Comments) and Cough    Inflammation, coughing  . Reglan [Metoclopramide] Other (See Comments)    REACTION IS SIDE EFFECT Pt stated having lock jaw as a side effect  . Wellbutrin [Bupropion] Nausea And Vomiting and Cough  . Latex Rash  . Metformin And Related Diarrhea  . Penicillins Rash    [FROM PREVIOUS ENTRY-BEFORE 07/27/16] >>"ALL CILLINS"  PATIENT HAD A PCN REACTION WITH IMMEDIATE RASH, FACIAL/TONGUE/THROAT SWELLING, SOB, OR LIGHTHEADEDNESS WITH HYPOTENSION:  #  #  #  YES  #  #  #   Has patient had a PCN reaction causing severe rash involving mucus membranes or skin necrosis: No Has patient had a  PCN reaction that required hospitalization No Has patient had a PCN reaction occurring within the last 10 years:   #  #  #  YES  #  #  #   . Sulfa Antibiotics Itching  . Tape Rash    No prescriptions prior to admission.    No results found for this or any previous visit (from the past 48 hour(s)). No results found.  Review of Systems  Constitutional: Negative.   HENT: Negative.   Eyes: Negative.   Respiratory: Negative.   Cardiovascular: Negative.   Gastrointestinal: Negative.   Genitourinary: Negative.   Musculoskeletal: Positive for joint pain.  Skin: Negative.   Neurological: Negative.   Psychiatric/Behavioral: Negative.     Last menstrual period 02/12/2017. Physical Exam  Constitutional: She is oriented to person, place, and  time. She appears well-developed. No distress.  HENT:  Head: Normocephalic.  Eyes: EOM are normal. Pupils are equal, round, and reactive to light.  Respiratory: No respiratory distress.  GI: She exhibits no distension.  Musculoskeletal:  Positive impingement.  Pain with supraspinatus resistance.    Neurological: She is alert and oriented to person, place, and time.  Skin: Skin is warm and dry.  Psychiatric: She has a normal mood and affect.     Assessment/Plan Left shoulder impingement and rotator cuff tear   Will proceed with LEFT SHOULDER ARTHROSCOPY WITH ROTATOR CUFF REPAIR as scheduled.  Surgical procedure along with possible risks and complications discussed.  All questions answered.and wishes to proceed.    Benjiman Core, PA-C 02/19/2017, 7:03 AM

## 2017-02-19 NOTE — Unmapped External Note (Signed)
02/19/2017  11:27 AM  PATIENT:  Amber Larsen  56 y.o. female  PRE-OPERATIVE DIAGNOSIS:  Rotator Cuff Tear Left Shoulder  POST-OPERATIVE DIAGNOSIS:  left rotator cuff tear  PROCEDURE:  Procedure(s): LEFT SHOULDER ARTHROSCOPY with debridement andROTATOR CUFF REPAIR (Left)  SURGEON:  Surgeon(s) and Role:    * [name], MD - Primary  PHYSICIAN ASSISTANT: none.  ASSISTANTS: none   ANESTHESIA:   local, regional and general  EBL:  No intake/output data recorded.  BLOOD ADMINISTERED:none  DRAINS: none   LOCAL MEDICATIONS USED:  MARCAINE     SPECIMEN:  No Specimen  DISPOSITION OF SPECIMEN:  N/A  COUNTS:  YES  TOURNIQUET:  * No tourniquets in log *  DICTATION: .Dragon Dictation  PLAN OF CARE: Discharge to home after PACU  PATIENT DISPOSITION:  PACU - hemodynamically stable.   Delay start of Pharmacological VTE agent (>24hrs) due to surgical blood loss or risk of bleeding: yes

## 2017-02-20 ENCOUNTER — Encounter (HOSPITAL_COMMUNITY): Payer: Self-pay | Admitting: Orthopaedic Surgery

## 2017-02-20 DIAGNOSIS — E1122 Type 2 diabetes mellitus with diabetic chronic kidney disease: Secondary | ICD-10-CM | POA: Diagnosis not present

## 2017-02-20 DIAGNOSIS — M7542 Impingement syndrome of left shoulder: Secondary | ICD-10-CM | POA: Diagnosis not present

## 2017-02-20 DIAGNOSIS — M75102 Unspecified rotator cuff tear or rupture of left shoulder, not specified as traumatic: Secondary | ICD-10-CM | POA: Diagnosis not present

## 2017-02-20 DIAGNOSIS — N183 Chronic kidney disease, stage 3 (moderate): Secondary | ICD-10-CM | POA: Diagnosis not present

## 2017-02-20 DIAGNOSIS — E7800 Pure hypercholesterolemia, unspecified: Secondary | ICD-10-CM | POA: Diagnosis not present

## 2017-02-20 DIAGNOSIS — I129 Hypertensive chronic kidney disease with stage 1 through stage 4 chronic kidney disease, or unspecified chronic kidney disease: Secondary | ICD-10-CM | POA: Diagnosis not present

## 2017-02-20 LAB — GLUCOSE, CAPILLARY: GLUCOSE-CAPILLARY: 263 mg/dL — AB (ref 65–99)

## 2017-02-20 MED ORDER — OXYCODONE HCL 5 MG PO TABS
5.0000 mg | ORAL_TABLET | Freq: Once | ORAL | Status: AC
  Administered 2017-02-20: 5 mg via ORAL
  Filled 2017-02-20: qty 1

## 2017-02-20 NOTE — Discharge Instructions (Signed)
Ok to shower with arm across chest then dry off and reapply sling. Use ice on and off . See Dr. Lorin Mercy in one week.

## 2017-02-20 NOTE — Progress Notes (Signed)
   Subjective: 1 Day Post-Op Procedure(s) (LRB): LEFT SHOULDER ARTHROSCOPY with debridement andROTATOR CUFF REPAIR (Left) Patient reports pain as severe.    Objective: Vital signs in last 24 hours: Temp:  [97.2 F (36.2 C)-98.5 F (36.9 C)] 97.7 F (36.5 C) (06/12 0807) Pulse Rate:  [74-95] 86 (06/12 0807) Resp:  [0-23] 16 (06/12 0807) BP: (127-172)/(65-91) 149/85 (06/12 0807) SpO2:  [88 %-100 %] 99 % (06/12 0807)  Intake/Output from previous day: 06/11 0701 - 06/12 0700 In: 2353.3 [P.O.:720; I.V.:1633.3] Out: 100 [Blood:100] Intake/Output this shift: No intake/output data recorded.   Recent Labs  02/19/17 0820  HGB 11.5*    Recent Labs  02/19/17 0820  WBC 9.4  RBC 4.53  HCT 38.0  PLT 298    Recent Labs  02/19/17 0820  NA 136  K 4.0  CL 104  CO2 21*  BUN 30*  CREATININE 1.51*  GLUCOSE 252*  CALCIUM 9.2    Recent Labs  02/19/17 0820  INR 0.98    Neurologically intact No results found.  Assessment/Plan: 1 Day Post-Op Procedure(s) (LRB): LEFT SHOULDER ARTHROSCOPY with debridement andROTATOR CUFF REPAIR (Left) Plan: discharge home. Office one week. Discussed resuming Xarelto tomorrow as discussed.  Rx on chart. Dressing change office one week.   Tamara Henry 02/20/2017, 8:15 AM

## 2017-02-20 NOTE — Progress Notes (Signed)
Patient is discharged from room 3C07 at this time. Alert and in stable condition. IV site d/c'd and instructions read to patient with understanding verbalized. Left unit via wheelchair with all belongings at side. 

## 2017-02-21 ENCOUNTER — Ambulatory Visit (INDEPENDENT_AMBULATORY_CARE_PROVIDER_SITE_OTHER): Payer: Medicare Other | Admitting: Family Medicine

## 2017-02-21 ENCOUNTER — Encounter: Payer: Self-pay | Admitting: Family Medicine

## 2017-02-21 ENCOUNTER — Other Ambulatory Visit: Payer: Self-pay | Admitting: *Deleted

## 2017-02-21 ENCOUNTER — Encounter (HOSPITAL_COMMUNITY): Payer: Medicare Other

## 2017-02-21 VITALS — BP 146/94 | HR 84 | Temp 97.9°F | Ht 64.0 in | Wt 245.2 lb

## 2017-02-21 DIAGNOSIS — E1121 Type 2 diabetes mellitus with diabetic nephropathy: Secondary | ICD-10-CM

## 2017-02-21 DIAGNOSIS — I2510 Atherosclerotic heart disease of native coronary artery without angina pectoris: Secondary | ICD-10-CM | POA: Diagnosis not present

## 2017-02-21 DIAGNOSIS — Z794 Long term (current) use of insulin: Secondary | ICD-10-CM | POA: Diagnosis not present

## 2017-02-21 DIAGNOSIS — E1165 Type 2 diabetes mellitus with hyperglycemia: Secondary | ICD-10-CM | POA: Diagnosis not present

## 2017-02-21 DIAGNOSIS — IMO0002 Reserved for concepts with insufficient information to code with codable children: Secondary | ICD-10-CM

## 2017-02-21 DIAGNOSIS — E119 Type 2 diabetes mellitus without complications: Secondary | ICD-10-CM

## 2017-02-21 LAB — POCT GLYCOSYLATED HEMOGLOBIN (HGB A1C): HEMOGLOBIN A1C: 10

## 2017-02-21 MED ORDER — BASAGLAR KWIKPEN 100 UNIT/ML ~~LOC~~ SOPN: 50.0000 [IU] | PEN_INJECTOR | Freq: Every day | SUBCUTANEOUS | 2 refills | Status: DC

## 2017-02-21 MED ORDER — POTASSIUM CHLORIDE CRYS ER 20 MEQ PO TBCR: 10.0000 meq | EXTENDED_RELEASE_TABLET | Freq: Every day | ORAL | 0 refills | Status: DC

## 2017-02-21 NOTE — Progress Notes (Signed)
Subjective:     Patient ID: Tamara Henry, female   DOB: August 05, 1961, 56 y.o.   MRN: [id number]  HPI Tamara Henry is a 56yo female presenting today for hyperglycemia. Reports she is concerned about her blood sugar being elevated. States she was recently hospitalized following shoulder surgery on Monday and was told her blood sugar is very elevated despite insulin. Reports compliance with Novolog 5units twice daily with meals and Basaglar 45units daily. Checks blood sugar 4 times daily. Fasting blood sugar normally in low 200s. Postprandial blood sugars usually in 200s as well, but rarely notes elevations as high as 400-500. Last A1C 8.9 in 10/2016. Wishes to work on her diet and interested in establishing with Nutrition. Former Smoker.  Review of Systems Per HPI    Objective:   Physical Exam  Constitutional: She appears well-developed and well-nourished. No distress.  Cardiovascular: Normal rate and regular rhythm.   Pulmonary/Chest: Effort normal. No respiratory distress.  Psychiatric: She has a normal mood and affect. Her behavior is normal.      Assessment and Plan:     1. Type 2 diabetes mellitus without complication, with long-term current use of insulin (HCC) A1C elevated to 10 today. Will increase Basasglar to 50units daily; consider transitioning from dose at night to dose in the morning. Discussed symptoms of hypoglycemia. Handout on the Glycemic Index given. To follow up with Nutrition. Follow up with PCP.

## 2017-02-21 NOTE — Patient Instructions (Signed)
Please increase your Basaglar to 50units. You may consider switching to taking it in the morning if you like. Continue your Novolog as you are currently taking it--we may increase this at your next visit. We will check your A1C. Please follow up with Nutrition. Please follow up with your PCP, Dr. Juanito Doom.  Dr. Gerlean Ren

## 2017-02-23 ENCOUNTER — Telehealth (INDEPENDENT_AMBULATORY_CARE_PROVIDER_SITE_OTHER): Payer: Self-pay | Admitting: Orthopaedic Surgery

## 2017-02-23 ENCOUNTER — Telehealth: Payer: Self-pay | Admitting: Family Medicine

## 2017-02-23 ENCOUNTER — Encounter (HOSPITAL_COMMUNITY): Payer: Medicare Other

## 2017-02-23 NOTE — Telephone Encounter (Signed)
Received a patient feedback form from Ms. Heisler. Called her back to discuss. No answer. Left voicemail.   Smitty Cords, MD [facility], PGY-2

## 2017-02-23 NOTE — Telephone Encounter (Signed)
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 [employer] phone#  [phone] fax# [phone] Attn Ailene Ravel.

## 2017-02-23 NOTE — Telephone Encounter (Signed)
OK to send thanks

## 2017-02-23 NOTE — Telephone Encounter (Signed)
Ok for note 

## 2017-02-26 ENCOUNTER — Encounter (HOSPITAL_COMMUNITY): Payer: Medicare Other

## 2017-02-26 NOTE — Telephone Encounter (Signed)
Pt returned dr Collene Leyden phone call

## 2017-02-26 NOTE — Telephone Encounter (Signed)
Note printed.

## 2017-02-28 ENCOUNTER — Encounter (HOSPITAL_COMMUNITY): Payer: Medicare Other

## 2017-02-28 ENCOUNTER — Encounter (INDEPENDENT_AMBULATORY_CARE_PROVIDER_SITE_OTHER): Payer: Self-pay | Admitting: Orthopaedic Surgery

## 2017-02-28 ENCOUNTER — Ambulatory Visit (INDEPENDENT_AMBULATORY_CARE_PROVIDER_SITE_OTHER): Payer: Medicare Other | Admitting: Orthopaedic Surgery

## 2017-02-28 ENCOUNTER — Telehealth: Payer: Self-pay | Admitting: *Deleted

## 2017-02-28 VITALS — BP 131/75 | HR 80

## 2017-02-28 DIAGNOSIS — M75122 Complete rotator cuff tear or rupture of left shoulder, not specified as traumatic: Secondary | ICD-10-CM

## 2017-02-28 MED ORDER — HYDROCODONE-ACETAMINOPHEN 5-325 MG PO TABS: 1.0000 | ORAL_TABLET | Freq: Four times a day (QID) | ORAL | 0 refills | Status: DC | PRN

## 2017-02-28 NOTE — Progress Notes (Signed)
Post-Op Visit Note   Patient: Tamara Henry           Date of Birth: October 12, 1960           MRN: [id number] Visit Date: 02/28/2017 PCP: Carlyle Dolly, MD   Assessment & Plan: post [facility] repair with two ultrafix anchors. Moderate size tear.   Chief Complaint:  Chief Complaint  Patient presents with  . Left Shoulder - Routine Post Op   Visit Diagnoses: post RCRepair Left shoulder.  Plan: Home gentle ROM discussed. Active assistive starting in 3 wks.    ROV 4 wks   instructed on gentle circle slings and elephant slings. Recheck 4 weeks. Prescription Norco given. She can stop the Percocet.  Follow-Up Instructions: No Follow-up on file.   Orders:  No orders of the defined types were placed in this encounter.  No orders of the defined types were placed in this encounter.   Imaging: No results found.  PMFS History: Patient Active Problem List   Diagnosis Date Noted  . Impingement syndrome of left shoulder 02/19/2017  . Hypersomnia 12/12/2016  . REM behavioral disorder 12/12/2016  . Asthma 12/12/2016  . Flatulence 11/17/2016  . Neck pain 11/17/2016  . CKD (chronic kidney disease), stage III 11/02/2016  . At risk for sleep apnea 11/01/2016  . CAD (coronary artery disease), native coronary artery 10/26/2016  . Encounter for post surgical wound check 10/23/2016  . Poor social situation 10/13/2016  . Anemia 08/30/2016  . Leg pain 08/30/2016  . Bilateral low back pain without sciatica 08/30/2016  . 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 Bilateral pulmonary embolism ([place]) 05/17/2016  . Hyperlipidemia 05/17/2016  . Depression 05/17/2016  . Diabetic peripheral neuropathy ([place]) 05/17/2016  . Neuritis of upper extremity, C7 01/15/2015  . DM type 2, uncontrolled, with renal complications ([place]) 62/95/2841  . Hypertension 01/14/2015  . Numbness and tingling of right arm 01/14/2015   Past Medical History:  Diagnosis Date  . Acid reflux   . Anxiety   . Arthritis    back  . Asthma     . Back pain   . Bilateral pulmonary embolism ([place]) 05/2016  . Bipolar disorder ([place])   . CAD in native artery    S/P NSTEMI with cath showing severe 3 vessel ASCAD s/p CABGx4 07/28/16.  . Cataracts, bilateral   . CKD (chronic kidney disease), stage III    borderline CKD II-III  . Cocaine abuse    In remission. Stopped using in early 2000's  . Depression   . Diabetes mellitus ([place])    Type II  . GERD (gastroesophageal reflux disease)   . Glaucoma   . High cholesterol   . History of blood transfusion   . 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 Hx of chest tube placement right   . Hypertension   . Morbid obesity ([place])   . Myocardial infarction ([place])   . Neuropathy    hands and feet  . Pneumonia   . Postoperative anemia 07/2016  . PTSD (post-traumatic stress disorder)   . Stroke [facility]) 2005   no residual    Family History  Problem Relation Age of Onset  . Pancreatitis Mother   . Diabetes type II Mother   . CAD Father   . Diabetes type II Father   . Diabetes type II Sister   . Diabetes type II Brother     Past Surgical History:  Procedure Laterality Date  . CARDIAC CATHETERIZATION N/A 07/25/2016   Procedure:  Left Heart Cath and Coronary Angiography;  Surgeon: Peter M Martinique, MD;  Location: [place] [facility];  Service: Cardiovascular;  Laterality: N/A;  . COLONOSCOPY W/ POLYPECTOMY    . CORONARY ARTERY BYPASS GRAFT N/A 07/28/2016   Procedure: CORONARY ARTERY BYPASS GRAFTING (CABG)x4 using left internal mammary and endoscopic harvest of right greater saphenous vein;  Surgeon: Ivin Poot, MD;  Location: [facility];  Service: Open Heart Surgery;  Laterality: N/A;  . DENTAL SURGERY     to removed broken teeth  . SHOULDER ARTHROSCOPY WITH ROTATOR CUFF REPAIR Left 02/19/2017   Procedure: LEFT SHOULDER ARTHROSCOPY with debridement andROTATOR CUFF REPAIR;  Surgeon: [name], MD;  Location: [place];  Service: Orthopedics;  Laterality: Left;  . TEE WITHOUT CARDIOVERSION N/A 07/28/2016    Procedure: TRANSESOPHAGEAL ECHOCARDIOGRAM (TEE);  Surgeon: Ivin Poot, MD;  Location: [place];  Service: Open Heart Surgery;  Laterality: N/A;  . TUBAL LIGATION     Social History   Occupational History  . disabled    Social History Main Topics  . Smoking status: Former Smoker    Quit date: 09/11/2005  . Smokeless tobacco: Never Used  . Alcohol use No  . Drug use: No  . Sexual activity: Yes    Birth control/ protection: Surgical

## 2017-02-28 NOTE — Telephone Encounter (Signed)
Received fax from [employer] requesting signature on order for incontinence supplies. Urinary incontinence is not listed on patient's problem list and do not see this issue addressed in recent OV notes. Please advise. Hubbard Hartshorn, RN, BSN

## 2017-02-28 NOTE — Telephone Encounter (Signed)
Faxed and given to patient at appt

## 2017-03-01 DIAGNOSIS — F332 Major depressive disorder, recurrent severe without psychotic features: Secondary | ICD-10-CM | POA: Diagnosis not present

## 2017-03-01 NOTE — Telephone Encounter (Signed)
Returned patient's call. No answer. Left voicemail. Asked patient to please call the clinic tomorrow and schedule an appointment with me in the next couple weeks to follow up on her chronic medical conditions.   Smitty Cords, MD [place], PGY-2

## 2017-03-01 NOTE — Telephone Encounter (Signed)
Attempted to call patient and discuss. She has never endorsed to me that she has incontinence. No answer. Left voicemail. Will try again at another time.   Smitty Cords, MD [place], PGY-2

## 2017-03-01 NOTE — Telephone Encounter (Signed)
Pt states she hasnt discussed this issue with [facility]. Pt says she has been addressing her issues from her head down and hasnt reached that area yet.  She would like to talk to dr Juanito Doom about this

## 2017-03-02 ENCOUNTER — Telehealth: Payer: Self-pay | Admitting: Pulmonary Disease

## 2017-03-02 ENCOUNTER — Encounter (HOSPITAL_COMMUNITY): Payer: Medicare Other

## 2017-03-02 NOTE — Telephone Encounter (Signed)
lmomtcb x1 

## 2017-03-05 ENCOUNTER — Encounter (HOSPITAL_COMMUNITY): Payer: Medicare Other

## 2017-03-05 NOTE — Telephone Encounter (Signed)
Left message for patient to call back  

## 2017-03-06 ENCOUNTER — Encounter: Payer: Self-pay | Admitting: Family Medicine

## 2017-03-06 NOTE — Telephone Encounter (Signed)
Attempted to contact pt. Call went straight to voicemail. lmtcb x3 for pt. 

## 2017-03-07 ENCOUNTER — Encounter (HOSPITAL_COMMUNITY): Payer: Medicare Other

## 2017-03-07 ENCOUNTER — Telehealth: Payer: Self-pay | Admitting: Pulmonary Disease

## 2017-03-07 DIAGNOSIS — G4733 Obstructive sleep apnea (adult) (pediatric): Secondary | ICD-10-CM

## 2017-03-07 NOTE — Telephone Encounter (Signed)
Pt calling about previous message - requesting a switch in DME.  Tamara Henry   4:29 PM  pt would like to switch DMEs. pt overall experience with [facility] was not good. Pt would order to be placed to [facility]...pt contact # [phone]..ert  Current DME is too far from her house and would like to switch to [facility].  Pt due to follow up with [facility] in July - she is out of office.  Will send to DOD to get documentation that it is okay to switch DME.  Please advise Dr Ashok Cordia. Thanks.

## 2017-03-07 NOTE — Discharge Summary (Signed)
Patient ID: Ilda Laskin MRN: [id number] DOB/AGE: Jan 24, 1961 56 y.o.  Admit date: 02/19/2017 Discharge date: 03/07/2017  Admission Diagnoses:  Active Problems:   Impingement syndrome of left shoulder   Discharge Diagnoses:  Active Problems:   Impingement syndrome of left shoulder  status post Procedure(s): LEFT SHOULDER ARTHROSCOPY with debridement andROTATOR CUFF REPAIR  Past Medical History:  Diagnosis Date  . Acid reflux   . Anxiety   . Arthritis    back  . Asthma   . Back pain   . Bilateral pulmonary embolism ([place]) 05/2016  . Bipolar disorder ([place])   . CAD in native artery    S/P NSTEMI with cath showing severe 3 vessel ASCAD s/p CABGx4 07/28/16.  . Cataracts, bilateral   . CKD (chronic kidney disease), stage III    borderline CKD II-III  . Cocaine abuse    In remission. Stopped using in early 2000's  . Depression   . Diabetes mellitus ([place])    Type II  . GERD (gastroesophageal reflux disease)   . Glaucoma   . High cholesterol   . History of blood transfusion   . 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 Hx of chest tube placement right   . Hypertension   . Morbid obesity ([place])   . Myocardial infarction ([place])   . Neuropathy    hands and feet  . Pneumonia   . Postoperative anemia 07/2016  . PTSD (post-traumatic stress disorder)   . Stroke [facility]) 2005   no residual    Surgeries: Procedure(s): LEFT SHOULDER ARTHROSCOPY with debridement andROTATOR CUFF REPAIR on 02/19/2017   Consultants:   Discharged Condition: Improved  Hospital Course: Tamara Henry is an 56 y.o. female who was admitted 02/19/2017 for operative treatment of shoulder impingement and rotator cuff tear. Patient failed conservative treatments (please see the history and physical for the specifics) and had severe unremitting pain that affects sleep, daily activities and work/hobbies. After pre-op clearance, the patient was taken to the operating room on 02/19/2017 and underwent  Procedure(s): LEFT SHOULDER  ARTHROSCOPY with debridement andROTATOR CUFF REPAIR.    Patient was given perioperative antibiotics:  Anti-infectives    Start     Dose/Rate Route Frequency Ordered Stop   02/19/17 0945  clindamycin (CLEOCIN) IVPB 900 mg     900 mg 100 mL/hr over 30 Minutes Intravenous To ShortStay Surgical 02/16/17 1329 02/19/17 1005       Patient was given sequential compression devices and early ambulation to prevent DVT.   Patient benefited maximally from hospital stay and there were no complications. At the time of discharge, the patient was urinating/moving their bowels without difficulty, tolerating a regular diet, pain is controlled with oral pain medications and they have been cleared by PT/OT.   Recent vital signs: No data found.    Recent laboratory studies: No results for input(s): WBC, HGB, HCT, PLT, NA, K, CL, CO2, BUN, CREATININE, GLUCOSE, INR, CALCIUM in the last 72 hours.  Invalid input(s): PT, 2   Discharge Medications:   Allergies as of 02/20/2017      Reactions   Lactose Intolerance (gi) Diarrhea   Lisinopril Other (See Comments), Cough   Inflammation, coughing   Reglan [metoclopramide] Other (See Comments)   REACTION IS SIDE EFFECT Pt stated having lock jaw as a side effect   Wellbutrin [bupropion] Nausea And Vomiting, Cough   Latex Rash   Metformin And Related Diarrhea   Penicillins Rash   [FROM PREVIOUS ENTRY-BEFORE 07/27/16] >>"ALL CILLINS" PATIENT HAD A  PCN REACTION WITH IMMEDIATE RASH, FACIAL/TONGUE/THROAT SWELLING, SOB, OR LIGHTHEADEDNESS WITH HYPOTENSION:  #  #  #  YES  #  #  #   Has patient had a PCN reaction causing severe rash involving mucus membranes or skin necrosis: No Has patient had a PCN reaction that required hospitalization No Has patient had a PCN reaction occurring within the last 10 years:   #  #  #  YES  #  #  #    Sulfa Antibiotics Itching   Tape Rash      Medication List    STOP taking these medications   acetaminophen 650 MG CR  tablet Commonly known as:  TYLENOL 8 HOUR ARTHRITIS PAIN   acetaminophen-codeine 300-30 MG tablet Commonly known as:  TYLENOL #3   COOL & HEAT PATCH EX   traMADol 50 MG tablet Commonly known as:  ULTRAM     TAKE these medications   ALIGN 4 MG Caps Take 1 capsule (4 mg total) by mouth daily.   amLODipine 10 MG tablet Commonly known as:  NORVASC Take 1 tablet (10 mg total) by mouth daily.   aspirin 81 MG EC tablet Take 1 tablet (81 mg total) by mouth daily.   atorvastatin 80 MG tablet Commonly known as:  LIPITOR Take 1 tablet (80 mg total) by mouth every evening.   ezetimibe 10 MG tablet Commonly known as:  ZETIA Take 1 tablet (10 mg total) by mouth daily.   ferrous sulfate 325 (65 FE) MG tablet Take 1 tablet (325 mg total) by mouth daily with breakfast.   folic acid 063 MCG tablet Commonly known as:  FOLVITE Take 400 mcg by mouth daily.   furosemide 40 MG tablet Commonly known as:  LASIX Take 1 tablet (40 mg total) by mouth daily.   gabapentin 300 MG capsule Commonly known as:  NEURONTIN Take 900 mg by mouth in the morning, 600 mg at lunch, and 900 mg at bedtime   insulin aspart 100 UNIT/ML injection Commonly known as:  novoLOG Inject 5 Units into the skin 2 (two) times daily before a meal.   loperamide 2 MG capsule Commonly known as:  IMODIUM Take 1 capsule (2 mg total) by mouth daily as needed for diarrhea or loose stools.   Melatonin 3 MG Tabs Take 6 mg by mouth at bedtime.   metoprolol tartrate 25 MG tablet Commonly known as:  LOPRESSOR Take 1 tablet (25 mg total) by mouth 2 (two) times daily.   multivitamin tablet Take 1 tablet by mouth daily.   omeprazole 40 MG capsule Commonly known as:  PRILOSEC Take 1 capsule (40 mg total) by mouth daily.   PARoxetine 40 MG tablet Commonly known as:  PAXIL Take 1 tablet (40 mg total) by mouth daily with breakfast.   rivaroxaban 20 MG Tabs tablet Commonly known as:  XARELTO Take 1 tablet (20 mg total)  by mouth every morning.   tiZANidine 4 MG tablet Commonly known as:  ZANAFLEX Take 1 tablet (4 mg total) by mouth every 8 (eight) hours as needed for muscle spasms.       Diagnostic Studies: No results found.  Discharge Instructions    Call MD / Call 911    Complete by:  As directed    If you experience chest pain or shortness of breath, CALL 911 and be transported to the hospital emergency room.  If you develope a fever above 101 F, pus (white drainage) or increased drainage or redness at the  wound, or calf pain, call your surgeon's office.   Constipation Prevention    Complete by:  As directed    Drink plenty of fluids.  Prune juice may be helpful.  You may use a stool softener, such as Colace (over the counter) 100 mg twice a day.  Use MiraLax (over the counter) for constipation as needed.   Diet - low sodium heart healthy    Complete by:  As directed    Discharge instructions    Complete by:  As directed    Remove dressing 48 hours postop and apply bandaids to incisions.  Shoulder immobilizer must be on at all times.  Do not move arm.  Ice off an on as needed.   Driving restrictions    Complete by:  As directed    No driving   Increase activity slowly as tolerated    Complete by:  As directed    Lifting restrictions    Complete by:  As directed    No lifting      Follow-up Information    Schedule an appointment as soon as possible for a visit with [name], MD.   Specialty:  Orthopedic Surgery Why:  call to schedule return office visit one week postop Contact information: [place] [address] [phone]           Discharge Plan:  discharge to home  Disposition:     Signed: Benjiman Core  03/07/2017, 8:43 AM

## 2017-03-07 NOTE — Telephone Encounter (Signed)
lmomtcb x 4 for the pt.  Per protocol will sign off of this message at this time.

## 2017-03-07 NOTE — Telephone Encounter (Signed)
That is fine. J.

## 2017-03-07 NOTE — Telephone Encounter (Signed)
Spoke with pt and made her aware of JN's message. Pt agreed to the order being placed and this was done. She had no further questions at this time.

## 2017-03-08 ENCOUNTER — Telehealth: Payer: Self-pay | Admitting: Family Medicine

## 2017-03-08 NOTE — Telephone Encounter (Signed)
Pt calling because she has not received her psych services in 6 months at [facility]. Pt would like to get psych services started up again. ep

## 2017-03-09 ENCOUNTER — Other Ambulatory Visit: Payer: Self-pay | Admitting: Family Medicine

## 2017-03-09 ENCOUNTER — Telehealth: Payer: Self-pay | Admitting: *Deleted

## 2017-03-09 ENCOUNTER — Encounter (HOSPITAL_COMMUNITY): Payer: Medicare Other

## 2017-03-09 ENCOUNTER — Other Ambulatory Visit (INDEPENDENT_AMBULATORY_CARE_PROVIDER_SITE_OTHER): Payer: Self-pay | Admitting: Surgery

## 2017-03-09 ENCOUNTER — Telehealth: Payer: Self-pay | Admitting: Acute Care

## 2017-03-09 NOTE — Telephone Encounter (Signed)
[name] who was scheduled with [facility] on 03/12/17 for a CPAP follow up. Patient's appt has been cancelled due to her not having her CPAP machine. She requested a change of DMEs on 03/05/17. Advised patient to call us ASAP after receiving her CPAP machine so that we can schedule her for a follow up. She verbalized understanding. Nothing further needed at time of call.

## 2017-03-09 NOTE — Telephone Encounter (Signed)
LVM for pt to call office back to inform her that she will just need to contact the place where she received services and set up an appointment.  She doesn't need a referral from [place] since she has already been established there. Please inform pt of this if she calls back.  Katharina Caper, Frenchie Pribyl D, [place]

## 2017-03-09 NOTE — Telephone Encounter (Signed)
Received request for Dr. Jacqualyn Posey to sign for L1096 - OTS Ankle Brace M. Dr. Jacqualyn Posey states he did not order. Return fax with note stating Dr. Jacqualyn Posey did not order.

## 2017-03-12 ENCOUNTER — Encounter (HOSPITAL_COMMUNITY): Payer: Medicare Other

## 2017-03-12 ENCOUNTER — Ambulatory Visit: Payer: Self-pay | Admitting: Acute Care

## 2017-03-12 NOTE — Telephone Encounter (Signed)
Rx request 

## 2017-03-12 NOTE — Telephone Encounter (Signed)
Ucall. OK to stop Xarelto now. thanks

## 2017-03-14 ENCOUNTER — Encounter (HOSPITAL_COMMUNITY): Payer: Medicare Other

## 2017-03-15 ENCOUNTER — Telehealth: Payer: Self-pay | Admitting: Podiatry

## 2017-03-15 NOTE — Telephone Encounter (Signed)
Rudy with [employer] called to check status of paperwork faxed to [place] about pt's orthotics. Requested a call back at [phone].

## 2017-03-16 ENCOUNTER — Encounter (HOSPITAL_COMMUNITY): Payer: Medicare Other

## 2017-03-16 NOTE — Telephone Encounter (Signed)
I contacted [employer] regarding paperwork. I reviewed pt's last note and states that she was measured for diabetic shoes and paperwork with her PCP was initiated at that time. I explained to them that our office would not be the one to sign off on any paperwork, her PCP would need to do that.

## 2017-03-19 ENCOUNTER — Encounter (HOSPITAL_COMMUNITY): Payer: Medicare Other

## 2017-03-21 ENCOUNTER — Ambulatory Visit (INDEPENDENT_AMBULATORY_CARE_PROVIDER_SITE_OTHER): Payer: Medicare Other | Admitting: Family Medicine

## 2017-03-21 ENCOUNTER — Encounter: Payer: Self-pay | Admitting: Family Medicine

## 2017-03-21 DIAGNOSIS — Z794 Long term (current) use of insulin: Secondary | ICD-10-CM | POA: Diagnosis not present

## 2017-03-21 DIAGNOSIS — I2510 Atherosclerotic heart disease of native coronary artery without angina pectoris: Secondary | ICD-10-CM | POA: Diagnosis present

## 2017-03-21 DIAGNOSIS — F339 Major depressive disorder, recurrent, unspecified: Secondary | ICD-10-CM

## 2017-03-21 DIAGNOSIS — I2699 Other pulmonary embolism without acute cor pulmonale: Secondary | ICD-10-CM | POA: Diagnosis not present

## 2017-03-21 DIAGNOSIS — E1121 Type 2 diabetes mellitus with diabetic nephropathy: Secondary | ICD-10-CM | POA: Diagnosis not present

## 2017-03-21 DIAGNOSIS — IMO0002 Reserved for concepts with insufficient information to code with codable children: Secondary | ICD-10-CM

## 2017-03-21 DIAGNOSIS — E1165 Type 2 diabetes mellitus with hyperglycemia: Secondary | ICD-10-CM

## 2017-03-21 MED ORDER — RIVAROXABAN 10 MG PO TABS: 10.0000 mg | ORAL_TABLET | Freq: Every morning | ORAL | 1 refills | Status: DC

## 2017-03-21 MED ORDER — CITALOPRAM HYDROBROMIDE 20 MG PO TABS: 20.0000 mg | ORAL_TABLET | Freq: Every day | ORAL | 1 refills | Status: DC

## 2017-03-21 MED ORDER — CITALOPRAM HYDROBROMIDE 20 MG PO TABS: 20.0000 mg | ORAL_TABLET | Freq: Every day | ORAL | 0 refills | Status: DC

## 2017-03-21 NOTE — Progress Notes (Signed)
Subjective:    Patient ID: Tamara Henry , female   DOB: 1960/12/30 , 56 y.o..   MRN: [id number]  HPI  Tamara Henry is a 56 year old female with past medical history of diabetes, hypertension, hyperlipidemia CAD, CABG, CKD stage III, bilateral pulmonary embolism and back pain here for:  1. Chronic Diabetes Disease Monitoring  Blood Sugar Ranges: High 100's. Check in the morning   Polyuria: no   Visual problems: no   Last hemoglobin A1C:  Lab Results  Component Value Date   HGBA1C 10.0 02/21/2017    Medication Compliance: Good, takes insulin glargine 50 units at bedtime and NovoLog 5 mg twice daily with biggest meals of the day  Medication Side Effects  Hypoglycemia: no   Preventitive Health Care  Eye Exam: Overdue. Referral was placed in Dec 2017 and patient states she went and they told her that she had glaucoma and cataracts. Her next visit is in September.              Foot Exam: up-to-date              Diet pattern: Patient states she eats 3 meals a day. Better with decreasing fried foods   Exercise: None   2. Chronic anticoagulation for bilateral PE: Patient has been on Xarelto since September 2017 after she was hospitalized for bilateral submassive PE. PE appears to be unprovoked. Per hospital discharge note that states that she should only be on anticoagulation for next 6 months after event. Patient is very nervous to stop Xarelto she does not want any more blood clots. She denies any previous blood clots prior to September 2017. Is agreeable to decreasing dose.   3. Depression and Anxiety  Report of symptoms: Patient admits to depression and anxiety and PTSD. She notes that she was diagnosed with depression and anxiety 3 years ago and that's when she was started on Paxil.  Duration of CURRENT symptoms: 6 months since stopping Paxil because she didn't like it Age of onset of first mood disturbance: 3 years ago   Impact on function: Minimal per her report  Psychiatric  History - Diagnoses: Depression, anxiety, PTSD - Hospitalizations:  None - Pharmacotherapy: Paxil  - Outpatient therapy: Was following with [facility] but was told she wouldn't be able to be seen for at least 6 months   Family history of psychiatric issues:brother has anxiety   Current and history of substance use: None currently. Used to use cocaine in the 90's.   Depression screen PHQ 2/9 03/21/2017  Decreased Interest 0  Down, Depressed, Hopeless 2  PHQ - 2 Score 2  Altered sleeping 1  Tired, decreased energy 2  Change in appetite 0  Feeling bad or failure about yourself  2  Trouble concentrating 2  Moving slowly or fidgety/restless 0  Suicidal thoughts 0  PHQ-9 Score 9  Difficult doing work/chores Somewhat difficult    GAD7: Not performed today   Review of Systems: Per HPI.    Past Medical History: Patient Active Problem List   Diagnosis Date Noted  . Impingement syndrome of left shoulder 02/19/2017  . Hypersomnia 12/12/2016  . REM behavioral disorder 12/12/2016  . Asthma 12/12/2016  . Flatulence 11/17/2016  . Neck pain 11/17/2016  . CKD (chronic kidney disease), stage III 11/02/2016  . At risk for sleep apnea 11/01/2016  . CAD (coronary artery disease), native coronary artery 10/26/2016  . Encounter for post surgical wound check 10/23/2016  . Poor social situation 10/13/2016  .  Anemia 08/30/2016  . Leg pain 08/30/2016  . Bilateral low back pain without sciatica 08/30/2016  . 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 Bilateral pulmonary embolism ([place]) 05/17/2016  . Hyperlipidemia 05/17/2016  . Depression 05/17/2016  . Diabetic peripheral neuropathy ([place]) 05/17/2016  . Neuritis of upper extremity, C7 01/15/2015  . DM type 2, uncontrolled, with renal complications ([place]) 63/78/5885  . Hypertension 01/14/2015  . Numbness and tingling of right arm 01/14/2015    Medications: reviewed and updated  Social Hx:  reports that she quit smoking about 11 years ago.  She has never used smokeless tobacco.   Objective:   BP 140/70   Pulse 70   Temp 98.2 F (36.8 C) (Oral)   Ht 5\' 4"  (1.626 m)   Wt 244 lb 9.6 oz (110.9 kg)   LMP 02/19/2017 (Approximate)   SpO2 98%   BMI 41.99 kg/m  Physical Exam  Gen: NAD, alert, cooperative with exam, well-appearing HEENT: NCAT, PERRL, clear conjunctiva, oropharynx clear, supple neck, poor dentition Cardiac: Regular rate and rhythm, normal S1/S2, no murmur, no edema, capillary refill brisk  Respiratory: Clear to auscultation bilaterally, no wheezes, non-labored breathing Psych: good insight, normal mood and affect  Assessment & Plan:  Depression Uncontrolled. Associated with anxiety. PHQ-9 score of 9 today. Was following with [facility], last saw 6 months ago. Was told that she would not be able to get an appointment until 6 months from now. Has not been on Paxil in 6 months as well. No suicidal or homicidal ideation.  -We will start Celexa 20 mg daily -We'll take Paxil off of patient's medication list -Follow-up in one week -Suggested that patient be seen by [facility] when they have an available appointment for her  DM type 2, uncontrolled, with renal complications ([place]) Uncontrolled. Last hgb A1c 10. Insulin glargine was increased to 50 units 1 month ago.  - Continue 50 units insulin glargine at bedtime - Continue Novolog 5 mg twice daily with biggest meals of the day - Return to clinic in 2 months for diabetes follow up  Bilateral pulmonary embolism ([place]) S/p Bilateral Submassive PE in Sept 2017. Patient approaching 1 year on Xarelto. It was suggested per her DC summary from the hospital that she stop Xarelto in mid march 2018. However, given the nature of having seemingly unprovoked bilateral PEs we kept her on Xarelto. Discussed anticoagulation regimen with Dr. Valentina Lucks. Discussed decreasing dose of Xarelto to 10 mg daily and patient was agreeable to this - Will decrease Xarelto  to 10 mg daily for now - Will probably keep patient on Xarelto awhile longer given that, per Epic review, it seems as though her PEs were unprovoked   Meds ordered this encounter  Medications  . DISCONTD: citalopram (CELEXA) 20 MG tablet    Sig: Take 1 tablet (20 mg total) by mouth daily.    Dispense:  30 tablet    Refill:  1  . citalopram (CELEXA) 20 MG tablet    Sig: Take 1 tablet (20 mg total) by mouth daily.    Dispense:  30 tablet    Refill:  0  . rivaroxaban (XARELTO) 10 MG TABS tablet    Sig: Take 1 tablet (10 mg total) by mouth every morning.    Dispense:  30 tablet    Refill:  1    Smitty Cords, MD [place], PGY-3

## 2017-03-21 NOTE — Progress Notes (Deleted)
Subjective:    Patient ID: Tamara Henry , female   DOB: 12-Jan-1961 , 56 y.o..   MRN: [id number]  HPI  Tamara Henry is a 56 year old female with past medical history of diabetes, hypertension, hyperlipidemia CAD, CABG, CKD stage III, bilateral pulmonary embolism and back pain here for  Chief Complaint  Patient presents with  . Medication Review   1. Chronic Diabetes Disease Monitoring  Blood Sugar Ranges: High 100's. Check in the morning   Polyuria: no   Visual problems: no   Last hemoglobin A1C:  Lab Results  Component Value Date   HGBA1C 10.0 02/21/2017    Medication Compliance: Good, takes insulin glargine 50 units at bedtime and NovoLog 5 mg twice daily with biggest meals of the day  Medication Side Effects  Hypoglycemia: no   Preventitive Health Care  Eye Exam: Overdue. Referral was placed in Dec 2017 and patient states she went and they told her that she had glaucoma and cataracts. Her next visit is in September.              Foot Exam: up-to-date              Diet pattern: Patient states she eats eats meals a day. Better with decreasing fried foods   Exercise: None   3. Chronic anticoagulation for bilateral PE: Patient has been on Xarelto since September 2017 after she was hospitalized for bilateral submassive PE. PE appears to be unprovoked. Per hospital discharge note that states that she should only be on anticoagulation for next 6 months after event. Patient is very nervous to stop Xarelto she does not want any more blood clots. She denies any previous blood clots prior to September 2017.   4. Presenting Issue: ***  Report of symptoms: ***  Duration of CURRENT symptoms:*** Age of onset of first mood disturbance: 3 years ago   Impact on function:***  Psychiatric History - Diagnoses:*** - Hospitalizations:  *** - Pharmacotherapy: *** - Outpatient therapy: ***  Family history of psychiatric issues:***  Current and history of substance  use:***  Other:***  PHQ-9:*** GAD7:***    Review of Systems: Per HPI. All other systems reviewed and are negative.  Health Maintenance Due  Topic Date Due  . Hepatitis C Screening  1961/04/19  . OPHTHALMOLOGY EXAM  07/07/1973  . URINE MICROALBUMIN  07/07/1973  . HIV Screening  07/07/1978  . TETANUS/TDAP  07/07/1982  . COLONOSCOPY  07/07/2013    Past Medical History: Patient Active Problem List   Diagnosis Date Noted  . Impingement syndrome of left shoulder 02/19/2017  . Hypersomnia 12/12/2016  . REM behavioral disorder 12/12/2016  . Asthma 12/12/2016  . Flatulence 11/17/2016  . Neck pain 11/17/2016  . CKD (chronic kidney disease), stage III 11/02/2016  . At risk for sleep apnea 11/01/2016  . CAD (coronary artery disease), native coronary artery 10/26/2016  . Encounter for post surgical wound check 10/23/2016  . Poor social situation 10/13/2016  . Anemia 08/30/2016  . Leg pain 08/30/2016  . Bilateral low back pain without sciatica 08/30/2016  . 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 Bilateral pulmonary embolism ([place]) 05/17/2016  . Hyperlipidemia 05/17/2016  . Depression 05/17/2016  . Diabetic peripheral neuropathy ([place]) 05/17/2016  . Neuritis of upper extremity, C7 01/15/2015  . DM type 2, uncontrolled, with renal complications ([place]) 62/94/7654  . Hypertension 01/14/2015  . Numbness and tingling of right arm 01/14/2015    Medications: reviewed and updated Current Outpatient Prescriptions  Medication Sig Dispense  Refill  . amLODipine (NORVASC) 10 MG tablet Take 1 tablet (10 mg total) by mouth daily. 30 tablet 3  . aspirin 81 MG EC tablet Take 1 tablet (81 mg total) by mouth daily.    Marland Kitchen atorvastatin (LIPITOR) 80 MG tablet Take 1 tablet (80 mg total) by mouth every evening. 90 tablet 3  . ferrous sulfate 325 (65 FE) MG tablet Take 1 tablet (325 mg total) by mouth daily with breakfast. 30 tablet 3  . folic acid (FOLVITE) 976 MCG tablet Take 400 mcg by mouth daily.    .  furosemide (LASIX) 40 MG tablet Take 1 tablet (40 mg total) by mouth daily. 30 tablet 3  . gabapentin (NEURONTIN) 300 MG capsule Take 900 mg by mouth in the morning, 600 mg at lunch, and 900 mg at bedtime 240 capsule 3  . HYDROcodone-acetaminophen (NORCO/VICODIN) 5-325 MG tablet Take 1 tablet by mouth every 6 (six) hours as needed for moderate pain. 30 tablet 0  . insulin aspart (NOVOLOG) 100 UNIT/ML injection Inject 5 Units into the skin 2 (two) times daily before a meal. 10 mL 3  . Insulin Glargine (BASAGLAR KWIKPEN) 100 UNIT/ML SOPN Inject 0.5 mLs (50 Units total) into the skin at bedtime. 3 pen 2  . loperamide (IMODIUM) 2 MG capsule Take 1 capsule (2 mg total) by mouth daily as needed for diarrhea or loose stools. 30 capsule 0  . Melatonin 3 MG TABS Take 6 mg by mouth at bedtime.    . metoprolol tartrate (LOPRESSOR) 25 MG tablet Take 1 tablet (25 mg total) by mouth 2 (two) times daily. 60 tablet 3  . Multiple Vitamins-Minerals (MULTIVITAMIN) tablet Take 1 tablet by mouth daily. 30 tablet 5  . omeprazole (PRILOSEC) 40 MG capsule Take 1 capsule (40 mg total) by mouth daily. 30 capsule 3  . PARoxetine (PAXIL) 40 MG tablet Take 1 tablet (40 mg total) by mouth daily with breakfast. 30 tablet 4  . potassium chloride SA (K-DUR,KLOR-CON) 20 MEQ tablet Take 0.5 tablets (10 mEq total) by mouth daily. 30 tablet 0  . Probiotic Product (ALIGN) 4 MG CAPS Take 1 capsule (4 mg total) by mouth daily. 30 capsule 2  . rivaroxaban (XARELTO) 20 MG TABS tablet Take 1 tablet (20 mg total) by mouth every morning. 30 tablet 0  . tiZANidine (ZANAFLEX) 4 MG tablet TAKE 1 TABLET BY MOUTH EVERY 8 HOURS AS NEEDED FOR MUSCLE SPASMS 30 tablet 1   No current facility-administered medications for this visit.     Social Hx:  reports that she quit smoking about 11 years ago. She has never used smokeless tobacco.   Objective:   BP 140/70   Pulse 70   Temp 98.2 F (36.8 C) (Oral)   Ht 5\' 4"  (1.626 m)   Wt 244 lb 9.6 oz  (110.9 kg)   LMP 02/19/2017 (Approximate)   SpO2 98%   BMI 41.99 kg/m  Physical Exam  Gen: NAD, alert, cooperative with exam, well-appearing HEENT: NCAT, PERRL, clear conjunctiva, oropharynx clear, supple neck Cardiac: Regular rate and rhythm, normal S1/S2, no murmur, no edema, capillary refill brisk  Respiratory: Clear to auscultation bilaterally, no wheezes, non-labored breathing Gastrointestinal: soft, non tender, non distended, bowel sounds present Skin: no rashes, normal turgor  Neurological: no gross deficits.  Psych: good insight, normal mood and affect  Assessment & Plan:  No problem-specific Assessment & Plan notes found for this encounter.  No orders of the defined types were placed in this encounter.  No  orders of the defined types were placed in this encounter.   Smitty Cords, MD [place], PGY-3

## 2017-03-21 NOTE — Assessment & Plan Note (Addendum)
Uncontrolled. Associated with anxiety. PHQ-9 score of 9 today. Was following with [facility], last saw 6 months ago. Was told that she would not be able to get an appointment until 6 months from now. Has not been on Paxil in 6 months as well. No suicidal or homicidal ideation.  -We will start Celexa 20 mg daily -We'll take Paxil off of patient's medication list -Follow-up in one week -Suggested that patient be seen by [facility] when they have an available appointment for her

## 2017-03-21 NOTE — Patient Instructions (Addendum)
Thank you for coming in today, it was so nice to see you! Today we talked about:    Psych meds: We have started you on Celexa today for depression and anxiety. I printed you prescription you can start taking today.   We have cut your blood thinner dose in half from 20 mg to 10 mg  Continue taking the same diabetes medicines you're taking  Please follow up in 1 week to discuss you mood. Also, please follow up in the next 1-2 months for a complete physical exam. You can schedule this appointment at the front desk before you leave or call the clinic.  Bring in all your medications or supplements to each appointment for review.   If we ordered any tests today, you will be notified via telephone of any abnormalities. If everything is normal you will get a letter in the mail.   If you have any questions or concerns, please do not hesitate to call the office at [phone]. You can also message me directly via MyChart.   Sincerely,  Smitty Cords, MD

## 2017-03-25 NOTE — Assessment & Plan Note (Signed)
Uncontrolled. Last hgb A1c 10. Insulin glargine was increased to 50 units 1 month ago.  - Continue 50 units insulin glargine at bedtime - Continue Novolog 5 mg twice daily with biggest meals of the day - Return to clinic in 2 months for diabetes follow up

## 2017-03-25 NOTE — Assessment & Plan Note (Addendum)
S/p Bilateral Submassive PE in Sept 2017. Patient approaching 1 year on Xarelto. It was suggested per her DC summary from the hospital that she stop Xarelto in mid march 2018. However, given the nature of having seemingly unprovoked bilateral PEs we kept her on Xarelto. Discussed anticoagulation regimen with Dr. Valentina Lucks. Discussed decreasing dose of Xarelto to 10 mg daily and patient was agreeable to this - Will decrease Xarelto to 10 mg daily for now - Will probably keep patient on Xarelto awhile longer given that, per Epic review, it seems as though her PEs were unprovoked

## 2017-03-27 ENCOUNTER — Encounter: Payer: Self-pay | Admitting: Internal Medicine

## 2017-03-27 ENCOUNTER — Ambulatory Visit (INDEPENDENT_AMBULATORY_CARE_PROVIDER_SITE_OTHER): Payer: Medicare Other | Admitting: Internal Medicine

## 2017-03-27 VITALS — BP 138/72 | HR 72 | Temp 98.3°F | Wt 247.0 lb

## 2017-03-27 DIAGNOSIS — N898 Other specified noninflammatory disorders of vagina: Secondary | ICD-10-CM | POA: Insufficient documentation

## 2017-03-27 DIAGNOSIS — R3129 Other microscopic hematuria: Secondary | ICD-10-CM | POA: Diagnosis not present

## 2017-03-27 DIAGNOSIS — I2510 Atherosclerotic heart disease of native coronary artery without angina pectoris: Secondary | ICD-10-CM | POA: Diagnosis not present

## 2017-03-27 DIAGNOSIS — R3989 Other symptoms and signs involving the genitourinary system: Secondary | ICD-10-CM | POA: Diagnosis present

## 2017-03-27 LAB — POCT URINALYSIS DIP (MANUAL ENTRY)
Bilirubin, UA: NEGATIVE
Glucose, UA: NEGATIVE mg/dL
Ketones, POC UA: NEGATIVE mg/dL
Nitrite, UA: NEGATIVE
SPEC GRAV UA: 1.015 (ref 1.010–1.025)
UROBILINOGEN UA: 0.2 U/dL
pH, UA: 6.5 (ref 5.0–8.0)

## 2017-03-27 LAB — POCT WET PREP (WET MOUNT)
Clue Cells Wet Prep Whiff POC: NEGATIVE
Trichomonas Wet Prep HPF POC: ABSENT

## 2017-03-27 MED ORDER — BASAGLAR KWIKPEN 100 UNIT/ML ~~LOC~~ SOPN: 50.0000 [IU] | PEN_INJECTOR | Freq: Every day | SUBCUTANEOUS | 0 refills | Status: DC

## 2017-03-27 NOTE — Patient Instructions (Signed)
It was so nice to meet you!  Your wet prep was completely normal. Your urine showed some blood in it. I'm not sure why this is happening. Your urine did not show any signs of infection. Please follow-up in 2 weeks with your primary care doctor to have your urine rechecked. If you still have blood in your urine, you will need to be seen by a urologist.  -Dr. Brett Albino

## 2017-03-27 NOTE — Assessment & Plan Note (Signed)
Patient presenting with change in urine color, which she describes as "white". Urine sample is yellow in appearance. Also noting that her urine is coming out "slower" than normal. No other urinary symptoms. No abnormalities such as a prolapse seen on physical exam. Do not think this is related to an infection, given her lack of infectious symptoms - UA performed in clinic with trace leukocytes, >300 protein, large RBCs - Advised that patient return for an appointment with her PCP in 2 weeks for repeat UA. If she continues to have microscopic hematuria, patient will need referral to urology for further evaluation. - Precepted with Dr. Wendy Poet

## 2017-03-27 NOTE — Assessment & Plan Note (Signed)
Likely physiologic. Patient not have any symptoms with this.  - Wet prep performed in clinic and was negative - Reassurance provided

## 2017-03-27 NOTE — Progress Notes (Signed)
   [facility] Phone: [phone]  Subjective:  Tamara Henry is a 56 year old female presenting to clinic with vaginal discharge and an abnormal urine color.  Vaginal Discharge: Has been going on for 1 week. Having "milky white" discharge. No itching or irritation. Has not been sexually active for the last 3 years since her husband passed away. No vaginal lesions.  Abnormal Urine Color: Has been going on for 1 week. Describes her urine as "white" in color. Also notes a slower stream than normal. She endorses some right sided back pain that started around the same time as then change in urine color. She states she is worried because she had a kidney infection in the past that spread to her bloodstream and she does not want this to happen again. Denies dysuria, urinary urgency, or urinary frequency. No fevers, no chills.  ROS: See HPI for pertinent positives and negatives  Past Medical History- hx bilateral PE, CAD s/p CABG x 4, HTN, T2DM, asthma, CKD III, depression, HLD  Family history reviewed for today's visit. No changes.  Social history- patient is a former smoker  Objective: BP 138/72   Pulse 72   Temp 98.3 F (36.8 C) (Oral)   Wt 247 lb (112 kg)   SpO2 98%   BMI 42.40 kg/m  Gen: NAD, alert, cooperative with exam Back: No CVA tenderness GI: Soft, no suprapubic tenderness, no guarding, no rebound GU: External genitalia normal in appearance, no vaginal lesions, vaginal walls normal, cervix normal in appearance, small amount of white discharge present.  Assessment/Plan: Vaginal Discharge: Likely physiologic. Patient not have any symptoms with this.  - Wet prep performed in clinic and was negative - Reassurance provided  Microscopic hematuria: Patient presenting with change in urine color, which she describes as "white". Urine sample is yellow in appearance. Also noting that her urine is coming out "slower" than normal. No other urinary symptoms. No  abnormalities such as a prolapse seen on physical exam. Do not think this is related to an infection, given her lack of infectious symptoms - UA performed in clinic with trace leukocytes, >300 protein, large RBCs - Advised that patient return for an appointment with her PCP in 2 weeks for repeat UA. If she continues to have microscopic hematuria, patient will need referral to urology for further evaluation. - Precepted with Dr. Wendy Poet   Hyman Bible, MD PGY-3

## 2017-03-28 ENCOUNTER — Ambulatory Visit: Payer: Self-pay

## 2017-04-03 ENCOUNTER — Ambulatory Visit (INDEPENDENT_AMBULATORY_CARE_PROVIDER_SITE_OTHER): Payer: Medicare Other | Admitting: Orthopaedic Surgery

## 2017-04-11 ENCOUNTER — Telehealth: Payer: Self-pay | Admitting: Family Medicine

## 2017-04-11 ENCOUNTER — Other Ambulatory Visit: Payer: Self-pay | Admitting: Family Medicine

## 2017-04-11 MED ORDER — RIVAROXABAN 10 MG PO TABS: 10.0000 mg | ORAL_TABLET | Freq: Every morning | ORAL | 1 refills | Status: DC

## 2017-04-11 NOTE — Telephone Encounter (Signed)
Will forward to PCP.  Raechelle Sarti L, RN  

## 2017-04-11 NOTE — Telephone Encounter (Signed)
Pt is on vacation and forgot her insulin pen at home. Pt has Novolog with her. Pt wants to know if she should just take more of the Novolog. Please advise. ep

## 2017-04-11 NOTE — Telephone Encounter (Signed)
Returned patient's call. No answer. Left voicemail that she SHOULD NOT take more of her novolog than what is already prescribed. I will be willing to send in a prescription to a pharmacy near her for her [place]. If she calls back, please ask her which pharmacy we can use to send in that prescription. Thank you.   Smitty Cords, MD [place], PGY-3

## 2017-04-11 NOTE — Telephone Encounter (Signed)
Pt  calling to request refill of:  Name of Medication(s):  Paxil, hydrocodone, and Xarelto Last date of OV:  03-27-17 Pharmacy:  [facility]   Will route refill request to Clinic RN.  Discussed with patient policy to call pharmacy for future refills.  Also, discussed refills may take up to 48 hours to approve or deny.  Renella Cunas

## 2017-04-12 ENCOUNTER — Telehealth (INDEPENDENT_AMBULATORY_CARE_PROVIDER_SITE_OTHER): Payer: Self-pay | Admitting: Orthopaedic Surgery

## 2017-04-12 ENCOUNTER — Other Ambulatory Visit: Payer: Self-pay | Admitting: *Deleted

## 2017-04-12 MED ORDER — BASAGLAR KWIKPEN 100 UNIT/ML ~~LOC~~ SOPN: 50.0000 [IU] | PEN_INJECTOR | Freq: Every day | SUBCUTANEOUS | 0 refills | Status: DC

## 2017-04-12 NOTE — Telephone Encounter (Signed)
Returned call to patient left message to call back to reschedule appt. [phone]

## 2017-04-13 NOTE — Telephone Encounter (Signed)
Patient rescheduled appt.  Would like a Rx refill on Hydrocodone.  CB# is [phone].  Please advise.

## 2017-04-16 ENCOUNTER — Ambulatory Visit (INDEPENDENT_AMBULATORY_CARE_PROVIDER_SITE_OTHER): Payer: Medicare Other | Admitting: Family

## 2017-04-16 ENCOUNTER — Ambulatory Visit (INDEPENDENT_AMBULATORY_CARE_PROVIDER_SITE_OTHER): Payer: Medicare Other | Admitting: Orthopaedic Surgery

## 2017-04-16 ENCOUNTER — Encounter (INDEPENDENT_AMBULATORY_CARE_PROVIDER_SITE_OTHER): Payer: Self-pay | Admitting: Family

## 2017-04-16 VITALS — Ht 64.0 in | Wt 247.0 lb

## 2017-04-16 DIAGNOSIS — M542 Cervicalgia: Secondary | ICD-10-CM | POA: Diagnosis not present

## 2017-04-16 DIAGNOSIS — I2510 Atherosclerotic heart disease of native coronary artery without angina pectoris: Secondary | ICD-10-CM

## 2017-04-16 DIAGNOSIS — M7542 Impingement syndrome of left shoulder: Secondary | ICD-10-CM

## 2017-04-16 MED ORDER — HYDROCODONE-ACETAMINOPHEN 5-325 MG PO TABS: 1.0000 | ORAL_TABLET | Freq: Three times a day (TID) | ORAL | 0 refills | Status: DC | PRN

## 2017-04-16 MED ORDER — PREDNISONE 10 MG PO TABS: ORAL_TABLET | ORAL | 0 refills | Status: DC

## 2017-04-16 NOTE — Telephone Encounter (Signed)
Pt has appt today. Will address at appt.

## 2017-04-16 NOTE — Progress Notes (Signed)
Office Visit Note   Patient: Tamara Henry           Date of Birth: [birth year]           MRN: [id number] Visit Date: 04/16/2017              Requested by: Carlyle Dolly, MD [address] [place], [place] [address] PCP: Carlyle Dolly, MD  Chief Complaint  Patient presents with  . Left Shoulder - Routine Post Op    02/19/17 left shoulder scope deb rtc      HPI: The patient is a 56 year old woman who is seen today for evaluation of numbness and electrical pain shooting down her left arm states she had a fell back in February of this year been having neck pain and arm pain and shoulder pain since.   She did have a rotator cuff repair with Dr. Lorin Mercy on June 11. Has had continued stiffness and pain in her shoulder is requesting a sling with a foam pad that she has seen others wearing that looks like it will be more trouble today.   However greatest complaint is the numbness and shooting pain down her arm into all 5 fingers. Pain radiates from the shoulder States the arm feels heavy and weak. Constant pain. Loss of sleep due to pain. She states she has tried prednisone but is unable to recall whether this was helpful.  MRI from March was revealing for multilevel disc bulges. The report as below.  Assessment & Plan: Visit Diagnoses:  1. Cervicalgia     Plan: Will provide a one-week prescription for Norco. Provided a prednisone taper as well as a referral to Dr. Ernestina Patches for evaluation for [facility].  Follow-Up Instructions: Return for after esi with ya tes.   Back Exam   Tenderness  The patient is experiencing no tenderness.   Comments:  Spurling's positive on the left. Free range of motion of her neck is painless.   Right Hand Exam   Muscle Strength  Grip: 5/5    Left Hand Exam   Muscle Strength  Grip:  4/5    Left Shoulder Exam   Tenderness  Left shoulder tenderness location: Global.  Range of Motion  Active Abduction: 80  Passive Abduction: 90   Tests   Impingement: positive      Patient is alert, oriented, no adenopathy, well-dressed, normal affect, normal respiratory effort. Left trapezius nontender  Imaging: No results found. EXAM: MRI CERVICAL SPINE WITHOUT CONTRAST  TECHNIQUE: Multiplanar, multisequence MR imaging of the cervical spine was performed. No intravenous contrast was administered.  COMPARISON:  01/14/2015  FINDINGS: Alignment: Straightening without subluxation  Vertebrae: No fracture, evidence of discitis, or bone lesion.  Cord: Normal signal and morphology.  Posterior Fossa, vertebral arteries, paraspinal tissues: Enlarged nodular thyroid evaluated by sonography 03/25/2015.  Disc levels:  C2-3: Stable and negative for impingement  C3-4: Chronic small central disc protrusion contacting the ventral cord without compression. Patent foramina. Negative facets  C4-5: Small central disc protrusion contacting the ventral cord. Negative facets. Patent foramina  C5-6: Small central disc protrusion without cord impingement. Negative facets. Patent foramina  C6-7: Mild disc narrowing with broad disc bulging. Mild facet spurring. Patent foramina  C7-T1:Unremarkable.  T2-3:  Small disc protrusion without cord impingement.  IMPRESSION: 1. Mild spinal stenosis from C3-4 to C6-7 due to disc bulges and small protrusions. 2. Diffusely patent foramina with mild narrowing on the left at C6-7. 3. T2-3 small disc protrusion without cord impingement.  Labs: Lab Results  Component Value Date   HGBA1C 10.0 02/21/2017   HGBA1C 8.9 11/01/2016   HGBA1C 9.2 (H) 07/26/2016   REPTSTATUS 01/14/2015 FINAL 01/13/2015   CULT NO GROWTH Performed at [facility]  01/13/2015    Orders:  Orders Placed This Encounter  Procedures  . Ambulatory referral to Physical Medicine Rehab   Meds ordered this encounter  Medications  . HYDROcodone-acetaminophen (NORCO/VICODIN) 5-325 MG tablet    Sig:  Take 1 tablet by mouth 3 (three) times daily as needed for moderate pain.    Dispense:  30 tablet    Refill:  0  . predniSONE (DELTASONE) 10 MG tablet    Sig: 6 tablets for 2 days, then 5 for 2 days, then 4 for 2 days, then 3  for 2 days, then 2 for 2 days, then 1 tablet for 2 days    Dispense:  42 tablet    Refill:  0     Procedures: No procedures performed  Clinical Data: No additional findings.  ROS:  All other systems negative, except as noted in the HPI. Review of Systems  Constitutional: Negative for chills and fever.  Musculoskeletal: Positive for myalgias and neck pain.  Neurological: Positive for weakness and numbness.    Objective: Vital Signs: Ht 5\' 4"  (1.626 m)   Wt 247 lb (112 kg)   BMI 42.40 kg/m   Specialty Comments:  No specialty comments available.  PMFS History: Patient Active Problem List   Diagnosis Date Noted  . Vaginal discharge 03/27/2017  . Microscopic hematuria 03/27/2017  . Impingement syndrome of left shoulder 02/19/2017  . Hypersomnia 12/12/2016  . REM behavioral disorder 12/12/2016  . Asthma 12/12/2016  . Flatulence 11/17/2016  . Neck pain 11/17/2016  . CKD (chronic kidney disease), stage III 11/02/2016  . At risk for sleep apnea 11/01/2016  . CAD (coronary artery disease), native coronary artery 10/26/2016  . Encounter for post surgical wound check 10/23/2016  . Poor social situation 10/13/2016  . Anemia 08/30/2016  . Leg pain 08/30/2016  . Bilateral low back pain without sciatica 08/30/2016  . 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 Bilateral pulmonary embolism ([place]) 05/17/2016  . Hyperlipidemia 05/17/2016  . Depression 05/17/2016  . Diabetic peripheral neuropathy ([place]) 05/17/2016  . Neuritis of upper extremity, C7 01/15/2015  . DM type 2, uncontrolled, with renal complications ([place]) 75/91/6384  . Hypertension 01/14/2015  . Numbness and tingling of right arm 01/14/2015   Past Medical History:  Diagnosis Date  . Acid reflux   . Anxiety    . Arthritis    back  . Asthma   . Back pain   . Bilateral pulmonary embolism ([place]) 05/2016  . Bipolar disorder ([place])   . CAD in native artery    S/P NSTEMI with cath showing severe 3 vessel ASCAD s/p CABGx4 07/28/16.  . Cataracts, bilateral   . CKD (chronic kidney disease), stage III    borderline CKD II-III  . Cocaine abuse    In remission. Stopped using in early 2000's  . Depression   . Diabetes mellitus ([place])    Type II  . GERD (gastroesophageal reflux disease)   . Glaucoma   . High cholesterol   . History of blood transfusion   . 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 Hx of chest tube placement right   . Hypertension   . Morbid obesity ([place])   . Myocardial infarction ([place])   . Neuropathy    hands and feet  .  Pneumonia   . Postoperative anemia 07/2016  . PTSD (post-traumatic stress disorder)   . Stroke [facility]) 2005   no residual    Family History  Problem Relation Age of Onset  . Pancreatitis Mother   . Diabetes type II Mother   . CAD Father   . Diabetes type II Father   . Diabetes type II Sister   . Diabetes type II Brother     Past Surgical History:  Procedure Laterality Date  . CARDIAC CATHETERIZATION N/A 07/25/2016   Procedure: Left Heart Cath and Coronary Angiography;  Surgeon: Peter M Martinique, MD;  Location: [facility];  Service: Cardiovascular;  Laterality: N/A;  . COLONOSCOPY W/ POLYPECTOMY    . CORONARY ARTERY BYPASS GRAFT N/A 07/28/2016   Procedure: CORONARY ARTERY BYPASS GRAFTING (CABG)x4 using left internal mammary and endoscopic harvest of right greater saphenous vein;  Surgeon: Ivin Poot, MD;  Location: [facility];  Service: Open Heart Surgery;  Laterality: N/A;  . DENTAL SURGERY     to removed broken teeth  . SHOULDER ARTHROSCOPY WITH ROTATOR CUFF REPAIR Left 02/19/2017   Procedure: LEFT SHOULDER ARTHROSCOPY with debridement andROTATOR CUFF REPAIR;  Surgeon: [name], MD;  Location: [facility];  Service: Orthopedics;  Laterality: Left;  . TEE WITHOUT  CARDIOVERSION N/A 07/28/2016   Procedure: TRANSESOPHAGEAL ECHOCARDIOGRAM (TEE);  Surgeon: Ivin Poot, MD;  Location: [facility];  Service: Open Heart Surgery;  Laterality: N/A;  . TUBAL LIGATION     Social History   Occupational History  . disabled    Social History Main Topics  . Smoking status: Former Smoker    Quit date: 09/11/2005  . Smokeless tobacco: Never Used  . Alcohol use No  . Drug use: No  . Sexual activity: Yes    Birth control/ protection: Surgical

## 2017-04-20 ENCOUNTER — Telehealth: Payer: Self-pay | Admitting: Family Medicine

## 2017-04-20 NOTE — Telephone Encounter (Signed)
Pt saw Dr. Brett Albino recently and states she was told she needs a referral for a urologist. ep

## 2017-04-24 ENCOUNTER — Encounter (INDEPENDENT_AMBULATORY_CARE_PROVIDER_SITE_OTHER): Payer: Self-pay | Admitting: Physical Medicine and Rehabilitation

## 2017-04-24 ENCOUNTER — Ambulatory Visit (INDEPENDENT_AMBULATORY_CARE_PROVIDER_SITE_OTHER): Payer: Medicare Other | Admitting: Physical Medicine and Rehabilitation

## 2017-04-24 ENCOUNTER — Telehealth (INDEPENDENT_AMBULATORY_CARE_PROVIDER_SITE_OTHER): Payer: Self-pay | Admitting: Radiology

## 2017-04-24 ENCOUNTER — Telehealth: Payer: Self-pay | Admitting: Family Medicine

## 2017-04-24 DIAGNOSIS — M25512 Pain in left shoulder: Secondary | ICD-10-CM

## 2017-04-24 DIAGNOSIS — M5412 Radiculopathy, cervical region: Secondary | ICD-10-CM

## 2017-04-24 DIAGNOSIS — M5010 Cervical disc disorder with radiculopathy, unspecified cervical region: Secondary | ICD-10-CM | POA: Diagnosis not present

## 2017-04-24 DIAGNOSIS — I2510 Atherosclerotic heart disease of native coronary artery without angina pectoris: Secondary | ICD-10-CM

## 2017-04-24 DIAGNOSIS — G8929 Other chronic pain: Secondary | ICD-10-CM | POA: Diagnosis not present

## 2017-04-24 NOTE — Telephone Encounter (Signed)
Discussed this with patient on the phone. Will be in touch with her ortho office.   Smitty Cords, MD [place], PGY-3

## 2017-04-24 NOTE — Telephone Encounter (Signed)
Patient came by office wanting to follow up on pcp giving [place]. Auth.for treatment by Dr Lorin Mercy. If any questions please call [phone]

## 2017-04-24 NOTE — Telephone Encounter (Signed)
Called patient to discuss this. Per Dr. Velia Meyer previous note in July, she wanted her to follow up in 2 weeks with me to recheck her urine and if it was abnormal then refer her to urology. Patient understands and will make an appt with me.   Smitty Cords, MD [place], PGY-3

## 2017-04-24 NOTE — Progress Notes (Deleted)
Pain with sitting standing or even lying down that goes into her left shoulder and down into her hand on the back side.  Did just have shoulder surgery on the side for Rotator cuff repair by Dr. Lorin Mercy.  Right handed. gambino [employer]

## 2017-04-24 NOTE — Telephone Encounter (Signed)
Hey Dr. Juanito Doom, Ms. Feig saw Dr. Ernestina Patches this morning and he is wanting to do a Left C7-T1 Interlaminer injection on her however she is on Xarelto and she will need to be off of it for 2 days.  Please let use know if she can stop it and we will get her scheduled.  Thank you for your help.

## 2017-04-25 DIAGNOSIS — N183 Chronic kidney disease, stage 3 (moderate): Secondary | ICD-10-CM | POA: Diagnosis not present

## 2017-04-25 DIAGNOSIS — N2581 Secondary hyperparathyroidism of renal origin: Secondary | ICD-10-CM | POA: Diagnosis not present

## 2017-04-25 DIAGNOSIS — D631 Anemia in chronic kidney disease: Secondary | ICD-10-CM | POA: Diagnosis not present

## 2017-04-26 NOTE — Telephone Encounter (Signed)
I called and lmovm for patient to call u back to sched appt---Needs to bee of Xarelto for 2 days prior--

## 2017-04-27 ENCOUNTER — Encounter (INDEPENDENT_AMBULATORY_CARE_PROVIDER_SITE_OTHER): Payer: Self-pay | Admitting: Physical Medicine and Rehabilitation

## 2017-04-27 DIAGNOSIS — D631 Anemia in chronic kidney disease: Secondary | ICD-10-CM | POA: Diagnosis not present

## 2017-04-27 DIAGNOSIS — Z6841 Body Mass Index (BMI) 40.0 and over, adult: Secondary | ICD-10-CM | POA: Diagnosis not present

## 2017-04-27 DIAGNOSIS — I129 Hypertensive chronic kidney disease with stage 1 through stage 4 chronic kidney disease, or unspecified chronic kidney disease: Secondary | ICD-10-CM | POA: Diagnosis not present

## 2017-04-27 DIAGNOSIS — N2581 Secondary hyperparathyroidism of renal origin: Secondary | ICD-10-CM | POA: Diagnosis not present

## 2017-04-27 DIAGNOSIS — N183 Chronic kidney disease, stage 3 (moderate): Secondary | ICD-10-CM | POA: Diagnosis not present

## 2017-04-27 NOTE — Progress Notes (Signed)
Tamara Henry - 56 y.o. female MRN [id number]  Date of birth: 1960/11/17  Office Visit Note: Visit Date: 04/24/2017 PCP: Carlyle Dolly, MD Referred by: Carlyle Dolly, MD  Subjective: Chief Complaint  Patient presents with  . Neck - Pain   HPI: Tamara Henry is a 56 year old right-hand-dominant female with a history of coronary artery disease status post CABG and currently on Xarelto. Her case is also complicated by uncontrolled diabetes type 2. She reports a fall out of her bed February 10 of this year and since that time has had worsening neck and shoulder and arm pain. She saw Dr. Lorin Mercy in our office and ultimately underwent rotator cuff repair in June of this year. MRI of the shoulder did show large full-thickness tear. This is reviewed below. She also had MRI of the cervical spine completed at that time that showed some small central protrusions as well as spondylosis and broad bulging but no focal stenosis. This MRI is reviewed below. Since the shoulder surgery she reports still pain that some more of an electrical shock-type pain down the dorsum of the left arm into the hand. She reports some relief raising the arm but has a hard time raising the arm after the surgery. She recently saw Dondra Prader, FN-P for continued neck and left arm pain. She's been taking a small amount of hydrocodone as well as a course of prednisone without much relief at all. She's had some therapy. She also has a history of polyneuropathy. She has been taking gabapentin. She's had no prior cervical surgery. No prior cervical injections. Her medications are managed by Dr. Chauncey Reading at [facility].    Review of Systems  Constitutional: Negative for chills, fever, malaise/fatigue and weight loss.  HENT: Negative for hearing loss and sinus pain.   Eyes: Negative for blurred vision, double vision and photophobia.  Respiratory: Negative for cough and shortness of breath.     Cardiovascular: Negative for chest pain, palpitations and leg swelling.  Gastrointestinal: Negative for abdominal pain, nausea and vomiting.  Genitourinary: Negative for flank pain.  Musculoskeletal: Positive for neck pain. Negative for myalgias.       Left arm pain  Skin: Negative for itching and rash.  Neurological: Positive for tingling. Negative for tremors, focal weakness and weakness.  Endo/Heme/Allergies: Negative.   Psychiatric/Behavioral: Negative for depression.  All other systems reviewed and are negative.  Otherwise per HPI.  Assessment & Plan: Visit Diagnoses:  1. Cervical radiculopathy   2. Cervical disc disorder with radiculopathy   3. Chronic left shoulder pain     Plan: Findings:  Chronic worsening neck and shoulder and arm pain with electrical-type arm pain into the hand in somewhat of a more C7 distribution and the top of the hand but also somewhat nondermatomal. She had a fall in February and reports pain since that time. This was a fall out of her bed. MRI evidence of pathology of the shoulder with rotator cuff tearing was more impressive than the cervical MRI. She's had repair of the rotator cuff at this point. It does sound like her pain is radicular to some degree. Just clinically watching her in the office and seems to be radicular the way she moves it. The MRI of the cervical spine again is not super extensive for compression of any nerves but it could be a radiculitis. She would be a good candidate for cervical epidural injection at C7-T1. We discussed this at length with her and she  does want to proceed with that. We would need to get her off of the Xarelto anticoagulation. We'll seek approval for this. We'll get her scheduled for the injection once that is done. She has failed medication management and continues with small amount of opioid. She is on gabapentin.    Meds & Orders: No orders of the defined types were placed in this encounter.  No orders of the  defined types were placed in this encounter.   Follow-up: Return for Left C7-T1 interlaminar epidural steroid injection.   Procedures: No procedures performed  No notes on file   Clinical History: MRI CERVICAL SPINE WITHOUT CONTRAST 12/07/2016   TECHNIQUE: Multiplanar, multisequence MR imaging of the cervical spine was performed. No intravenous contrast was administered.  COMPARISON:  01/14/2015  FINDINGS: Alignment: Straightening without subluxation  Vertebrae: No fracture, evidence of discitis, or bone lesion.  Cord: Normal signal and morphology.  Posterior Fossa, vertebral arteries, paraspinal tissues: Enlarged nodular thyroid evaluated by sonography 03/25/2015.  Disc levels:  C2-3: Stable and negative for impingement  C3-4: Chronic small central disc protrusion contacting the ventral cord without compression. Patent foramina. Negative facets  C4-5: Small central disc protrusion contacting the ventral cord. Negative facets. Patent foramina  C5-6: Small central disc protrusion without cord impingement. Negative facets. Patent foramina  C6-7: Mild disc narrowing with broad disc bulging. Mild facet spurring. Patent foramina  C7-T1:Unremarkable.  T2-3:  Small disc protrusion without cord impingement.  IMPRESSION: 1. Mild spinal stenosis from C3-4 to C6-7 due to disc bulges and small protrusions. 2. Diffusely patent foramina with mild narrowing on the left at C6-7. 3. T2-3 small disc protrusion without cord impingement.  She reports that she quit smoking about 11 years ago. She has never used smokeless tobacco.   Recent Labs  07/26/16 0422 11/01/16 1526 02/21/17 1654  HGBA1C 9.2* 8.9 10.0    Objective:  VS:  HT:    WT:   BMI:     BP:   HR: bpm  TEMP: ( )  RESP:  Physical Exam  Constitutional: She is oriented to person, place, and time. She appears well-developed and well-nourished. No distress.  HENT:  Head: Normocephalic and  atraumatic.  Nose: Nose normal.  Mouth/Throat: Oropharynx is clear and moist.  Eyes: Pupils are equal, round, and reactive to light. Conjunctivae and EOM are normal.  Neck: No tracheal deviation present.  Cardiovascular: Normal rate, regular rhythm and intact distal pulses.   Pulmonary/Chest: Effort normal. No respiratory distress.  Abdominal: She exhibits no distension. There is no guarding.  Musculoskeletal:  Cervical range of motion is limited with left rotation and extension and flexion. She does hold her head very stiff. She rotates further to the right however. She has difficulty with movement of the shoulder with more apprehension than true impingement. This is on the left. She has full shoulder on the right. She has 2+ muscle stretch reflexes at the biceps and brachioradialis bilaterally. She has a nondermatomal and. Sensation to light touch in the left arm. She has good strength with wrist extension long finger flexion and abduction. She has a negative Hoffmann's test bilaterally  Lymphadenopathy:    She has no cervical adenopathy.  Neurological: She is alert and oriented to person, place, and time. She exhibits normal muscle tone. Coordination normal.  Skin: Skin is warm and dry. No rash noted. No erythema.  Psychiatric: She has a normal mood and affect. Her behavior is normal.  Nursing note and vitals reviewed.   Ortho Exam  Imaging: No results found.  Past Medical/Family/Surgical/Social History: Medications & Allergies reviewed per EMR Patient Active Problem List   Diagnosis Date Noted  . Vaginal discharge 03/27/2017  . Microscopic hematuria 03/27/2017  . Impingement syndrome of left shoulder 02/19/2017  . Hypersomnia 12/12/2016  . REM behavioral disorder 12/12/2016  . Asthma 12/12/2016  . Flatulence 11/17/2016  . Neck pain 11/17/2016  . CKD (chronic kidney disease), stage III 11/02/2016  . At risk for sleep apnea 11/01/2016  . CAD (coronary artery disease), native  coronary artery 10/26/2016  . Encounter for post surgical wound check 10/23/2016  . Poor social situation 10/13/2016  . Anemia 08/30/2016  . Leg pain 08/30/2016  . Bilateral low back pain without sciatica 08/30/2016  . 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 Bilateral pulmonary embolism ([place]) 05/17/2016  . Hyperlipidemia 05/17/2016  . Depression 05/17/2016  . Diabetic peripheral neuropathy ([place]) 05/17/2016  . Neuritis of upper extremity, C7 01/15/2015  . DM type 2, uncontrolled, with renal complications ([place]) 75/64/3329  . Hypertension 01/14/2015  . Numbness and tingling of right arm 01/14/2015   Past Medical History:  Diagnosis Date  . Acid reflux   . Anxiety   . Arthritis    back  . Asthma   . Back pain   . Bilateral pulmonary embolism ([place]) 05/2016  . Bipolar disorder ([place])   . CAD in native artery    S/P NSTEMI with cath showing severe 3 vessel ASCAD s/p CABGx4 07/28/16.  . Cataracts, bilateral   . CKD (chronic kidney disease), stage III    borderline CKD II-III  . Cocaine abuse    In remission. Stopped using in early 2000's  . Depression   . Diabetes mellitus ([place])    Type II  . GERD (gastroesophageal reflux disease)   . Glaucoma   . High cholesterol   . History of blood transfusion   . 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 Hx of chest tube placement right   . Hypertension   . Morbid obesity ([place])   . Myocardial infarction ([place])   . Neuropathy    hands and feet  . Pneumonia   . Postoperative anemia 07/2016  . PTSD (post-traumatic stress disorder)   . Stroke [facility]) 2005   no residual   Family History  Problem Relation Age of Onset  . Pancreatitis Mother   . Diabetes type II Mother   . CAD Father   . Diabetes type II Father   . Diabetes type II Sister   . Diabetes type II Brother    Past Surgical History:  Procedure Laterality Date  . CARDIAC CATHETERIZATION N/A 07/25/2016   Procedure: Left Heart Cath and Coronary Angiography;  Surgeon: Peter M Martinique, MD;  Location: [facility];  Service: Cardiovascular;  Laterality: N/A;  . COLONOSCOPY W/ POLYPECTOMY    . CORONARY ARTERY BYPASS GRAFT N/A 07/28/2016   Procedure: CORONARY ARTERY BYPASS GRAFTING (CABG)x4 using left internal mammary and endoscopic harvest of right greater saphenous vein;  Surgeon: Ivin Poot, MD;  Location: [facility];  Service: Open Heart Surgery;  Laterality: N/A;  . DENTAL SURGERY     to removed broken teeth  . SHOULDER ARTHROSCOPY WITH ROTATOR CUFF REPAIR Left 02/19/2017   Procedure: LEFT SHOULDER ARTHROSCOPY with debridement andROTATOR CUFF REPAIR;  Surgeon: [name], MD;  Location: [facility];  Service: Orthopedics;  Laterality: Left;  . TEE WITHOUT CARDIOVERSION N/A 07/28/2016   Procedure: TRANSESOPHAGEAL ECHOCARDIOGRAM (TEE);  Surgeon: Ivin Poot, MD;  Location: [facility];  Service: Open Heart Surgery;  Laterality: N/A;  . TUBAL LIGATION     Social History   Occupational History  . disabled    Social History Main Topics  . Smoking status: Former Smoker    Quit date: 09/11/2005  . Smokeless tobacco: Never Used  . Alcohol use No  . Drug use: No  . Sexual activity: Yes    Birth control/ protection: Surgical

## 2017-04-27 NOTE — Telephone Encounter (Signed)
Pt scheduled for 05/08/17 @ 1:45 w/driver and is aware to d/c Xarelto on 05/06/17

## 2017-04-28 ENCOUNTER — Other Ambulatory Visit: Payer: Self-pay | Admitting: Internal Medicine

## 2017-04-30 NOTE — Telephone Encounter (Signed)
Received another faxed request for signature for incontinence supplies from [employer]. Spoke with Doren Custard at [employer] [phone]) explained patient has not been seen for urinary incontinence so PCP cannot sign order at this time. Patient has appt on 05/17/2017 with PCP; will discuss at that time. Hubbard Hartshorn, RN, BSN

## 2017-05-03 ENCOUNTER — Encounter: Payer: Self-pay | Admitting: Acute Care

## 2017-05-03 ENCOUNTER — Ambulatory Visit (INDEPENDENT_AMBULATORY_CARE_PROVIDER_SITE_OTHER): Payer: Medicare Other | Admitting: Acute Care

## 2017-05-03 VITALS — BP 128/64 | HR 68 | Ht 64.0 in | Wt 244.4 lb

## 2017-05-03 DIAGNOSIS — I2510 Atherosclerotic heart disease of native coronary artery without angina pectoris: Secondary | ICD-10-CM | POA: Diagnosis not present

## 2017-05-03 DIAGNOSIS — Z9989 Dependence on other enabling machines and devices: Secondary | ICD-10-CM | POA: Diagnosis not present

## 2017-05-03 DIAGNOSIS — G4733 Obstructive sleep apnea (adult) (pediatric): Secondary | ICD-10-CM | POA: Diagnosis not present

## 2017-05-03 NOTE — Assessment & Plan Note (Addendum)
Patient off to a  good start with her CPAP>> has used it for 10 days of 10 days Significant leak per mask AHI decreased to 21.5 from 88 on sleep study Plan You are off to a good start with your CPAP. You are benefiting from treatment as evidenced by your increased daytime energy and activity We will send you to [facility]  for a mask fitting for better fit ( Leaks per down Load) Continue on CPAP at bedtime. You appear to be benefiting from the treatment Goal is to wear for at least 4-6 hours each night for maximal clinical benefit. Continue to work on weight loss, as the link between excess weight  and sleep apnea is well established.  Do not drive if sleepy. Follow up in 2 months with down Load to re-evaluate  Please contact office for sooner follow up if symptoms do not improve or worsen or seek emergency care

## 2017-05-03 NOTE — Assessment & Plan Note (Signed)
BMI 41.95 kg/m Plan Continue to work on weight loss Increase exercise and activity

## 2017-05-03 NOTE — Patient Instructions (Addendum)
It is nice to see you tooday. You are off to a good start with your CPAP. You are benefiting from treatment as evidenced by your increased daytime energy and activity We will send you to [facility]  for a mask fitting for better fit ( Leaks per down Load) Continue on CPAP at bedtime. You appear to be benefiting from the treatment Goal is to wear for at least 4-6 hours each night for maximal clinical benefit. Continue to work on weight loss, as the link between excess weight  and sleep apnea is well established.  Do not drive if sleepy. Follow up in 2 months with down Load to re-evaluate  Please contact office for sooner follow up if symptoms do not improve or worsen or seek emergency care

## 2017-05-03 NOTE — Progress Notes (Signed)
History of Present Illness Tamara Henry is a 56 y.o. female with OSA and CPAP treatment initiated 04/2017. She was followed by Dr. Corrie Dandy.    05/03/2017 Follow up for CPAP initiation. Pt. Presents for follow up. She has had her machine for 10 days and is 100% compliant. She is having issues with her mask.She states it is very uncomfortable, and she has noticed it is leaking. Down Load confirms this. She states she has not noticed any improvement in her daytime sleepiness, but she states she sleeps better at night. She has fewer night time awakenings. She notices she has more energy and and is able to interact more with her grandchildren. She is more active during the day that she's been in years. She denies chest pain, fever  orthopnea or hemoptysis.   Test Results: [place] 04/23/2017 through 05/02/2017 Air sense 10 auto set 14 cm H2O-20 cm H2O Use 10 out of 10 days or 100% Median pressure 15 maximum pressure 18 Leaks 19.1 median 117.7 maximum AHI 21.5 (6.3 central,  3.0 obstructive,  11.4 un known) Cheyne-Stokes respiration 47 minutes or 10%( Significant cardiac history)  Patient states she has significant mask leak.   CBC Latest Ref Rng & Units 02/19/2017 08/17/2016 08/09/2016  WBC 4.0 - 10.5 K/uL 9.4 8.3 12.8(H)  Hemoglobin 12.0 - 15.0 g/dL 11.5(L) 9.9(L) 10.2(L)  Hematocrit 36.0 - 46.0 % 38.0 31.3(L) 32.2(L)  Platelets 150 - 400 K/uL 298 413(H) 355    BMP Latest Ref Rng & Units 02/19/2017 12/15/2016 10/10/2016  Glucose 65 - 99 mg/dL 252(H) 317(H) 310(H)  BUN 6 - 20 mg/dL 30(H) 26(H) 23  Creatinine 0.44 - 1.00 mg/dL 1.51(H) 1.42(H) 1.34(H)  BUN/Creat Ratio 9 - 23 - 18 17  Sodium 135 - 145 mmol/L 136 141 138  Potassium 3.5 - 5.1 mmol/L 4.0 3.8 4.5  Chloride 101 - 111 mmol/L 104 98 96  CO2 22 - 32 mmol/L 21(L) 24 20  Calcium 8.9 - 10.3 mg/dL 9.2 8.6(L) 9.1      Past medical hx Past Medical History:  Diagnosis Date  . Acid reflux   . Anxiety   . Arthritis    back  .  Asthma   . Back pain   . Bilateral pulmonary embolism ([place]) 05/2016  . Bipolar disorder ([place])   . CAD in native artery    S/P NSTEMI with cath showing severe 3 vessel ASCAD s/p CABGx4 07/28/16.  . Cataracts, bilateral   . CKD (chronic kidney disease), stage III    borderline CKD II-III  . Cocaine abuse    In remission. Stopped using in early 2000's  . Depression   . Diabetes mellitus ([place])    Type II  . GERD (gastroesophageal reflux disease)   . Glaucoma   . High cholesterol   . History of blood transfusion   . 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 Hx of chest tube placement right   . Hypertension   . Morbid obesity ([place])   . Myocardial infarction ([place])   . Neuropathy    hands and feet  . Pneumonia   . Postoperative anemia 07/2016  . PTSD (post-traumatic stress disorder)   . Stroke [facility]) 2005   no residual     Social History  Substance Use Topics  . Smoking status: Former Smoker    Quit date: 09/11/2005  . Smokeless tobacco: Never Used  . Alcohol use No    Tamara Henry reports that she quit smoking about 11 years ago. She has  never used smokeless tobacco. She reports that she does not drink alcohol or use drugs.  Tobacco Cessation: Former smoker quit 2007  Past surgical hx, Family hx, Social hx all reviewed.  Current Outpatient Prescriptions on File Prior to Visit  Medication Sig  . amLODipine (NORVASC) 10 MG tablet Take 1 tablet (10 mg total) by mouth daily.  Marland Kitchen aspirin 81 MG EC tablet Take 1 tablet (81 mg total) by mouth daily.  . citalopram (CELEXA) 20 MG tablet Take 1 tablet (20 mg total) by mouth daily.  . ferrous sulfate 325 (65 FE) MG tablet Take 1 tablet (325 mg total) by mouth daily with breakfast.  . folic acid (FOLVITE) 098 MCG tablet Take 400 mcg by mouth daily.  . furosemide (LASIX) 40 MG tablet Take 1 tablet (40 mg total) by mouth daily.  Marland Kitchen gabapentin (NEURONTIN) 300 MG capsule Take 900 mg by mouth in the morning, 600 mg at lunch, and 900 mg at bedtime  .  HYDROcodone-acetaminophen (NORCO/VICODIN) 5-325 MG tablet Take 1 tablet by mouth 3 (three) times daily as needed for moderate pain.  Marland Kitchen insulin aspart (NOVOLOG) 100 UNIT/ML injection Inject 5 Units into the skin 2 (two) times daily before a meal.  . Insulin Glargine (BASAGLAR KWIKPEN) 100 UNIT/ML SOPN INJECT 50 UNITS INTO SKIN EACH NIGHT AT BEDTIME  . loperamide (IMODIUM) 2 MG capsule Take 1 capsule (2 mg total) by mouth daily as needed for diarrhea or loose stools.  . Melatonin 3 MG TABS Take 6 mg by mouth at bedtime.  . metoprolol tartrate (LOPRESSOR) 25 MG tablet Take 1 tablet (25 mg total) by mouth 2 (two) times daily.  . Multiple Vitamins-Minerals (MULTIVITAMIN) tablet Take 1 tablet by mouth daily.  Marland Kitchen omeprazole (PRILOSEC) 40 MG capsule Take 1 capsule (40 mg total) by mouth daily.  . potassium chloride SA (K-DUR,KLOR-CON) 20 MEQ tablet Take 0.5 tablets (10 mEq total) by mouth daily.  . Probiotic Product (ALIGN) 4 MG CAPS Take 1 capsule (4 mg total) by mouth daily.  . rivaroxaban (XARELTO) 10 MG TABS tablet Take 1 tablet (10 mg total) by mouth every morning.  Marland Kitchen tiZANidine (ZANAFLEX) 4 MG tablet TAKE 1 TABLET BY MOUTH EVERY 8 HOURS AS NEEDED FOR MUSCLE SPASMS  . atorvastatin (LIPITOR) 80 MG tablet Take 1 tablet (80 mg total) by mouth every evening.   No current facility-administered medications on file prior to visit.      Allergies  Allergen Reactions  . Lactose Intolerance (Gi) Diarrhea  . Lisinopril Other (See Comments) and Cough    Inflammation, coughing  . Reglan [Metoclopramide] Other (See Comments)    REACTION IS SIDE EFFECT Pt stated having lock jaw as a side effect  . Wellbutrin [Bupropion] Nausea And Vomiting and Cough  . Latex Rash  . Metformin And Related Diarrhea  . Penicillins Rash    [FROM PREVIOUS ENTRY-BEFORE 07/27/16] >>"ALL CILLINS"  PATIENT HAD A PCN REACTION WITH IMMEDIATE RASH, FACIAL/TONGUE/THROAT SWELLING, SOB, OR LIGHTHEADEDNESS WITH HYPOTENSION:  #  #  #   YES  #  #  #   Has patient had a PCN reaction causing severe rash involving mucus membranes or skin necrosis: No Has patient had a PCN reaction that required hospitalization No Has patient had a PCN reaction occurring within the last 10 years:   #  #  #  YES  #  #  #   . Sulfa Antibiotics Itching  . Tape Rash    Review Of Systems:  Constitutional:  No  weight loss, night sweats,  Fevers, chills, fatigue, or  lassitude.  HEENT:   No headaches,  Difficulty swallowing,  Tooth/dental problems, or  Sore throat,                No sneezing, itching, ear ache, nasal congestion, post nasal drip,   CV:  No chest pain,  Orthopnea, PND, swelling in lower extremities, anasarca, dizziness, palpitations, syncope.   GI  No heartburn, indigestion, abdominal pain, nausea, vomiting, diarrhea, change in bowel habits, loss of appetite, bloody stools.   Resp: No shortness of breath with exertion or at rest.  No excess mucus, no productive cough,  No non-productive cough,  No coughing up of blood.  No change in color of mucus.  No wheezing.  No chest wall deformity  Skin: no rash or lesions.  GU: no dysuria, change in color of urine, no urgency or frequency.  No flank pain, no hematuria   MS:  No joint pain or swelling.  No decreased range of motion.  No back pain.  Psych:  No change in mood or affect. No depression or anxiety.  No memory loss.   Vital Signs BP 128/64 (BP Location: Right Arm, Patient Position: Sitting, Cuff Size: Normal) Comment (BP Location): forearm  Pulse 68   Ht 5\' 4"  (1.626 m)   Wt 244 lb 6.4 oz (110.9 kg)   SpO2 96%   BMI 41.95 kg/m   Body mass index is 41.95 kg/m. Physical Exam:  General- No distress,  A&Ox3 ENT: No sinus tenderness, TM clear, pale nasal mucosa, no oral exudate,no post nasal drip, no LAN Cardiac: S1, S2, regular rate and rhythm, no murmur Chest: No wheeze/ rales/ dullness; no accessory muscle use, no nasal flaring, no sternal retractions Abd.: Soft  Non-tender, obese Ext: No clubbing cyanosis, edema Neuro:  normal strength Skin: No rashes, warm and dry Psych: normal mood and behavior   Assessment/Plan  OSA on CPAP Patient off to a  good start with her CPAP>> has used it for 10 days of 10 days Significant leak per mask AHI decreased to 21.5 from 88 on sleep study Plan You are off to a good start with your CPAP. You are benefiting from treatment as evidenced by your increased daytime energy and activity We will send you to [facility]  for a mask fitting for better fit ( Leaks per down Load) Continue on CPAP at bedtime. You appear to be benefiting from the treatment Goal is to wear for at least 4-6 hours each night for maximal clinical benefit. Continue to work on weight loss, as the link between excess weight  and sleep apnea is well established.  Do not drive if sleepy. Follow up in 2 months with down Load to re-evaluate  Please contact office for sooner follow up if symptoms do not improve or worsen or seek emergency care    Morbid obesity due to excess calories ([place]) BMI 41.95 kg/m Plan Continue to work on weight loss Increase exercise and activity    Magdalen Spatz, NP 05/03/2017  2:26 PM

## 2017-05-08 ENCOUNTER — Encounter (INDEPENDENT_AMBULATORY_CARE_PROVIDER_SITE_OTHER): Payer: Self-pay | Admitting: Physical Medicine and Rehabilitation

## 2017-05-08 ENCOUNTER — Ambulatory Visit (INDEPENDENT_AMBULATORY_CARE_PROVIDER_SITE_OTHER): Payer: Medicare Other

## 2017-05-08 ENCOUNTER — Ambulatory Visit (INDEPENDENT_AMBULATORY_CARE_PROVIDER_SITE_OTHER): Payer: Medicare Other | Admitting: Physical Medicine and Rehabilitation

## 2017-05-08 VITALS — BP 134/68 | HR 73

## 2017-05-08 DIAGNOSIS — M5010 Cervical disc disorder with radiculopathy, unspecified cervical region: Secondary | ICD-10-CM | POA: Diagnosis not present

## 2017-05-08 DIAGNOSIS — M5412 Radiculopathy, cervical region: Secondary | ICD-10-CM

## 2017-05-08 MED ORDER — LIDOCAINE HCL (PF) 1 % IJ SOLN
2.0000 mL | Freq: Once | INTRAMUSCULAR | Status: AC
  Administered 2017-05-08: 2 mL

## 2017-05-08 MED ORDER — BETAMETHASONE SOD PHOS & ACET 6 (3-3) MG/ML IJ SUSP
12.0000 mg | Freq: Once | INTRAMUSCULAR | Status: AC
  Administered 2017-05-08: 12 mg

## 2017-05-08 NOTE — Patient Instructions (Signed)

## 2017-05-08 NOTE — Progress Notes (Deleted)
Patient is here for planned left C7-T1 interlaminar injection. Stopped taking Xarelto on Sunday.

## 2017-05-09 ENCOUNTER — Telehealth: Payer: Self-pay | Admitting: Acute Care

## 2017-05-09 DIAGNOSIS — Z9989 Dependence on other enabling machines and devices: Principal | ICD-10-CM

## 2017-05-09 DIAGNOSIS — G4733 Obstructive sleep apnea (adult) (pediatric): Secondary | ICD-10-CM

## 2017-05-09 NOTE — Telephone Encounter (Signed)
Will forward to [facility] pool per protocol since order already placed for this, thanks

## 2017-05-09 NOTE — Telephone Encounter (Signed)
No I haven't called her yet

## 2017-05-09 NOTE — Telephone Encounter (Signed)
I just spoke with Tamara Henry with [facility] and he stated the patient had an appt scheduled for 08/29 and she didn't bring in her mask for the fitting and was upset because no one ask her to bring in her mask that she was currently using.  She has been rescheduled for 9/20. They will need a new Rx stating it is medically necessary to give the patient another mask because insurance will not pay for the new mask right now. Per Sarajh's office note from 05/03/2017 looks like the mask fitting should have been setup with Tamara Henry over at the sleep lab.   We will send you to [facility]  for a mask fitting for better fit ( Leaks per down Load) Continue on CPAP at bedtime. You appear to be benefiting from the treatment Goal is to wear for at least 4-6 hours each night for maximal clinical benefit.

## 2017-05-09 NOTE — Telephone Encounter (Signed)
Do we need to call the patient regarding this information or have you already spoken with her? Thanks.

## 2017-05-10 NOTE — Addendum Note (Signed)
Addended by: Virl Cagey on: 05/10/2017 04:49 PM   Modules accepted: Orders

## 2017-05-10 NOTE — Telephone Encounter (Signed)
Yes the patient was supposed to be sent to [facility] at the sleep center for a mask fitting session.  Order has been placed and [facility] order cancelled.  Will send back to Rodena Piety to make aware.

## 2017-05-10 NOTE — Telephone Encounter (Signed)
Should the order have been for mask fitting with Tamara Henry over at the sleep lab

## 2017-05-11 NOTE — Procedures (Signed)
[name] is a 56 year old female with left neck and shoulder pain is failed conservative care. Please see our prior evaluation and management note for further details and justification.  Cervical Epidural Steroid Injection - Interlaminar Approach with Fluoroscopic Guidance  Patient: Tamara Henry      Date of Birth: 1961/03/01 MRN: [id number] PCP: Carlyle Dolly, MD      Visit Date: 05/08/2017   Universal Protocol:    Date/Time: 08/31/185:40 AM  Consent Given By: the patient  Position: PRONE  Additional Comments: Vital signs were monitored before and after the procedure. Patient was prepped and draped in the usual sterile fashion. The correct patient, procedure, and site was verified.   Injection Procedure Details:  Procedure Site One Meds Administered:  Meds ordered this encounter  Medications  . lidocaine (PF) (XYLOCAINE) 1 % injection 2 mL  . betamethasone acetate-betamethasone sodium phosphate (CELESTONE) injection 12 mg     Laterality: Left  Location/Site:  C7-T1  Needle size: 20 G  Needle type: Touhy  Needle Placement: Paramedian epidural space  Findings:  -Contrast Used: 1 mL iohexol 180 mg iodine/mL   -Comments: Excellent flow of contrast into the epidural space.  Procedure Details: Using a paramedian approach from the side mentioned above, the region overlying the inferior lamina was localized under fluoroscopic visualization and the soft tissues overlying this structure were infiltrated with 4 ml. of 1% Lidocaine without Epinephrine. A # 20 gauge, Tuohy needle was inserted into the epidural space using a paramedian approach.  The epidural space was localized using loss of resistance along with lateral and contralateral oblique bi-planar fluoroscopic views.  After negative aspirate for air, blood, and CSF, a 2 ml. volume of Isovue-250 was injected into the epidural space and the flow of contrast was observed. Radiographs were obtained for documentation  purposes.   The injectate was administered into the level noted above.  Additional Comments:  The patient tolerated the procedure well Dressing: Band-Aid    Post-procedure details: Patient was observed during the procedure. Post-procedure instructions were reviewed.  Patient left the clinic in stable condition.

## 2017-05-15 DIAGNOSIS — E103512 Type 1 diabetes mellitus with proliferative diabetic retinopathy with macular edema, left eye: Secondary | ICD-10-CM | POA: Diagnosis not present

## 2017-05-15 DIAGNOSIS — E103513 Type 1 diabetes mellitus with proliferative diabetic retinopathy with macular edema, bilateral: Secondary | ICD-10-CM | POA: Diagnosis not present

## 2017-05-16 ENCOUNTER — Ambulatory Visit (INDEPENDENT_AMBULATORY_CARE_PROVIDER_SITE_OTHER): Payer: Medicare Other | Admitting: Podiatry

## 2017-05-16 ENCOUNTER — Encounter: Payer: Self-pay | Admitting: Podiatry

## 2017-05-16 VITALS — BP 137/86 | HR 81

## 2017-05-16 DIAGNOSIS — M79674 Pain in right toe(s): Secondary | ICD-10-CM

## 2017-05-16 DIAGNOSIS — B351 Tinea unguium: Secondary | ICD-10-CM

## 2017-05-16 DIAGNOSIS — M79675 Pain in left toe(s): Secondary | ICD-10-CM | POA: Diagnosis not present

## 2017-05-16 DIAGNOSIS — E1142 Type 2 diabetes mellitus with diabetic polyneuropathy: Secondary | ICD-10-CM

## 2017-05-16 NOTE — Progress Notes (Signed)
This patient presents the office for an evaluation of her diabetic feet.  She says that her nails have grown thick and long to the big toes on both feet  . She also says she is interested in acquiring a pair of diabetic shoes.  She relates that her medical doctor Smitty Cords does not approve her acquiring diabetic shoes.  This patient requests that I send over another letter asking her to approve the diabetic shoes for this patient   GENERAL APPEARANCE: Alert, conversant. Appropriately groomed. No acute distress.  VASCULAR: Pedal pulses are  palpable at  [facility] and PT bilateral.  Capillary refill time is immediate to all digits,  Normal temperature gradient.  Digital hair growth is present bilateral  NEUROLOGIC: sensation is diminished  to 5.07 monofilament at 5/5 sites bilateral.  Light touch is intact bilateral, Muscle strength normal.  MUSCULOSKELETAL: acceptable muscle strength, tone and stability bilateral.  HAV  B/L  Hammer toes 2-5 B/L.  NAILS  thick disfigured discolored nails with subungual debris. Both hallux nails.  No evidence of any bacterial infection or drainage. DERMATOLOGIC: skin color, texture, and turgor are within normal limits.  No preulcerative lesions or ulcers  are seen, no interdigital maceration noted.  No open lesions present.   No drainage noted.  Onychomycosis  B/L  Diabetic neuropathy  HAV  Hammer toes.  Debride hallux nails.  Talked with [place] [facility] and is going to send him paperwork for this patient to her medical doctor for approval for diabetic shoes.   Gardiner Barefoot DPM .

## 2017-05-17 ENCOUNTER — Ambulatory Visit (INDEPENDENT_AMBULATORY_CARE_PROVIDER_SITE_OTHER): Payer: Medicare Other | Admitting: Family Medicine

## 2017-05-17 ENCOUNTER — Encounter: Payer: Self-pay | Admitting: Family Medicine

## 2017-05-17 VITALS — BP 128/60 | HR 76 | Temp 98.4°F | Ht 64.0 in | Wt 242.4 lb

## 2017-05-17 DIAGNOSIS — Z23 Encounter for immunization: Secondary | ICD-10-CM | POA: Diagnosis not present

## 2017-05-17 DIAGNOSIS — E1165 Type 2 diabetes mellitus with hyperglycemia: Secondary | ICD-10-CM

## 2017-05-17 DIAGNOSIS — F339 Major depressive disorder, recurrent, unspecified: Secondary | ICD-10-CM | POA: Diagnosis not present

## 2017-05-17 DIAGNOSIS — I2510 Atherosclerotic heart disease of native coronary artery without angina pectoris: Secondary | ICD-10-CM

## 2017-05-17 DIAGNOSIS — IMO0002 Reserved for concepts with insufficient information to code with codable children: Secondary | ICD-10-CM

## 2017-05-17 DIAGNOSIS — E1121 Type 2 diabetes mellitus with diabetic nephropathy: Secondary | ICD-10-CM | POA: Diagnosis not present

## 2017-05-17 DIAGNOSIS — Z794 Long term (current) use of insulin: Secondary | ICD-10-CM

## 2017-05-17 LAB — POCT URINALYSIS DIP (MANUAL ENTRY)
BILIRUBIN UA: NEGATIVE
BILIRUBIN UA: NEGATIVE mg/dL
GLUCOSE UA: NEGATIVE mg/dL
LEUKOCYTES UA: NEGATIVE
Nitrite, UA: NEGATIVE
Protein Ur, POC: 100 mg/dL — AB
Spec Grav, UA: 1.015 (ref 1.010–1.025)
Urobilinogen, UA: 0.2 E.U./dL
pH, UA: 5.5 (ref 5.0–8.0)

## 2017-05-17 LAB — POCT GLYCOSYLATED HEMOGLOBIN (HGB A1C): Hemoglobin A1C: 10.1

## 2017-05-17 NOTE — Patient Instructions (Addendum)
Thank you for coming in today, it was so nice to see you! Today we talked about:    Diabetes: We will check your A1C today. We did a diabetic foot exam today as well. Please check your sugar at least once a day, preferrably in the morning when you 1st wake up  Continue your current dose of Celexa  Please follow up in 1 month with me to follow up on your diabetes. Follow up in 1-2 months with our nurse Lauren for your annual free medicare wellness visit. You can schedule this appointment at the front desk before you leave or call the clinic.  Bring in all your medications or supplements to each appointment for review.   If we ordered any tests today, you will be notified via telephone of any abnormalities. If everything is normal you will get a letter in the mail.   If you have any questions or concerns, please do not hesitate to call the office at [phone]. You can also message me directly via MyChart.   Sincerely,  Smitty Cords, MD

## 2017-05-17 NOTE — Progress Notes (Signed)
Subjective:    Patient ID: Tamara Henry , female   DOB: April 13, 1961 , 56 y.o..   MRN: [id number]  HPI  Tamara Henry is here for  Chief Complaint  Patient presents with  . Diabetes     1. Chronic Diabetes  Disease Monitoring  Blood Sugar Ranges: has not been checking. States she hasn't been checking because she has been busy in the morning.   Polyuria: no   Visual problems: yes, has been following with an opthalmologist. 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 Goes to [facility]   Last hemoglobin A1C:  Lab Results  Component Value Date   HGBA1C 10.1 05/17/2017    Medication Compliance: Good, takes insulin glargine 50 units at bedtime and NovoLog 5 mg twice daily with biggest meals of the day   Medication Side Effects  Hypoglycemia: no   Preventitive Health Care  Eye Exam: has been following with ophthalmology  Foot Exam: up to date, requesting another one today.   Diet pattern: Patient states she eats 3 meals a day. Better with decreasing fried foods   Exercise: None  2. Depression: patient states that she has been doing significantly better since being started on Celexa. She denies any depression or anxiety today. Denies any suicidal or homicidal ideations.  Review of Systems: Per HPI.   Past Medical History: Patient Active Problem List   Diagnosis Date Noted  . OSA on CPAP 05/03/2017  . Morbid obesity due to excess calories ([place]) 05/03/2017  . Vaginal discharge 03/27/2017  . Microscopic hematuria 03/27/2017  . Impingement syndrome of left shoulder 02/19/2017  . Hypersomnia 12/12/2016  . REM behavioral disorder 12/12/2016  . Asthma 12/12/2016  . Flatulence 11/17/2016  . Neck pain 11/17/2016  . CKD (chronic kidney disease), stage III 11/02/2016  . At risk for sleep apnea 11/01/2016  . CAD (coronary artery disease), native coronary artery 10/26/2016  . Encounter for post surgical wound check 10/23/2016  . Poor social situation  10/13/2016  . Anemia 08/30/2016  . Leg pain 08/30/2016  . Bilateral low back pain without sciatica 08/30/2016  . 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 Bilateral pulmonary embolism ([place]) 05/17/2016  . Hyperlipidemia 05/17/2016  . Depression 05/17/2016  . Diabetic peripheral neuropathy ([place]) 05/17/2016  . Neuritis of upper extremity, C7 01/15/2015  . DM type 2, uncontrolled, with renal complications ([place]) 72/53/6644  . Hypertension 01/14/2015  . Numbness and tingling of right arm 01/14/2015    Medications: reviewed   Social Hx:  reports that she quit smoking about 11 years ago. She has never used smokeless tobacco.   Objective:   BP 128/60   Pulse 76   Temp 98.4 F (36.9 C) (Oral)   Ht 5\' 4"  (1.626 m)   Wt 242 lb 6.4 oz (110 kg)   LMP 03/16/2017 (Approximate)   SpO2 98%   BMI 41.61 kg/m  Physical Exam  Gen: NAD, alert, cooperative with exam, well-appearing Cardiac: Regular rate and rhythm Respiratory: Clear to auscultation bilaterally, no wheezes, non-labored breathing Psych: good insight, normal mood and affect  Diabetic Foot Exam - Simple   Simple Foot Form Diabetic Foot exam was performed with the following findings:  Yes 05/17/2017  1:07 PM  Visual Inspection See comments:  Yes Sensation Testing Intact to touch and monofilament testing bilaterally:  Yes Pulse Check Posterior Tibialis and Dorsalis pulse intact bilaterally:  Yes Comments Does have calluses on digits 2 bilaterally.      Assessment & Plan:  DM type 2, uncontrolled, with renal complications ([place]) Uncontrolled still. Hemoglobin A1c 10.1. Unclear what patient's glucoses have been as she has not been checking them consistently. Additionally not compliant with diabetic diet. -Continue 50 units of insulin at bedtime for now as well as 5 units of NovoLog twice daily with biggest meals of the day -Would like patient to check her glucose once daily at least in the morning so I will know how to adjust her insulin  regimen -Follow-up in one month for diabetes management -If glucoses and A1c remain high, may consider increasing Lantus and/or NovoLog -Diabetic foot exam performed today -Microalbumin checked today as well  Depression Feeling much better since starting Celexa in July 2018.Denies any depression or anxiety today. -Continue Celexa 20 mg daily -Follow-up in one month  Orders Placed This Encounter  Procedures  . Flu Vaccine QUAD 36+ mos IM  . Microalbumin/Creatinine Ratio, Urine  . POCT urinalysis dipstick  . HgB A1c   Smitty Cords, MD [place], PGY-3

## 2017-05-18 ENCOUNTER — Telehealth: Payer: Self-pay | Admitting: *Deleted

## 2017-05-18 ENCOUNTER — Ambulatory Visit: Payer: Medicare Other | Admitting: Podiatry

## 2017-05-18 LAB — MICROALBUMIN / CREATININE URINE RATIO
Creatinine, Urine: 77.2 mg/dL
MICROALB/CREAT RATIO: 624.7 mg/g{creat} — AB (ref 0.0–30.0)
MICROALBUM., U, RANDOM: 482.3 ug/mL

## 2017-05-18 NOTE — Telephone Encounter (Signed)
Patient left message on nurse line stating that she forgot to tell MD yesterday that she needed a rx for incontinence supplies to go to [employer] and she would like MD to fill out for for her to get a handicap placard. Patient can be reached at [phone]

## 2017-05-18 NOTE — Assessment & Plan Note (Addendum)
Uncontrolled still. Hemoglobin A1c 10.1. Unclear what patient's glucoses have been as she has not been checking them consistently. Additionally not compliant with diabetic diet. -Continue 50 units of insulin at bedtime for now as well as 5 units of NovoLog twice daily with biggest meals of the day -Would like patient to check her glucose once daily at least in the morning so I will know how to adjust her insulin regimen -Follow-up in one month for diabetes management -If glucoses and A1c remain high, may consider increasing Lantus and/or NovoLog -Diabetic foot exam performed today -Microalbumin checked today as well

## 2017-05-18 NOTE — Assessment & Plan Note (Signed)
Feeling much better since starting Celexa in July 2018.Denies any depression or anxiety today. -Continue Celexa 20 mg daily -Follow-up in one month

## 2017-05-21 ENCOUNTER — Ambulatory Visit (INDEPENDENT_AMBULATORY_CARE_PROVIDER_SITE_OTHER): Payer: Medicare Other | Admitting: Psychology

## 2017-05-21 DIAGNOSIS — F331 Major depressive disorder, recurrent, moderate: Secondary | ICD-10-CM
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
## 2017-05-21 NOTE — Telephone Encounter (Signed)
[employer] called request form to be faxed to them regarding incontinence supplies.  Derl Barrow, RN

## 2017-05-21 NOTE — Assessment & Plan Note (Signed)
Assessment/Plan/Recommendations: Tamara Henry has been feeling depressed due to her many stressors (sons in prison) and health problems (diabetes, MI, glaucoma, shoulder injury, PE). She has been feeling better since starting Celexa. I provided support and affirmation to the patient. She will try to incorporate some fun activities into her life ([place], [employer] events, church) and will also try to do some things to help with her diabetes (walking to the corner from her house two times per week). She will return to see me in two weeks on September 24 at 10:30.

## 2017-05-22 NOTE — Telephone Encounter (Signed)
Form filled out and placed in Lauren's box

## 2017-05-22 NOTE — Telephone Encounter (Signed)
Form faxed to [employer] at [phone]. Hubbard Hartshorn, RN, BSN

## 2017-05-23 ENCOUNTER — Ambulatory Visit: Payer: Medicare Other

## 2017-05-23 DIAGNOSIS — H1132 Conjunctival hemorrhage, left eye: Secondary | ICD-10-CM | POA: Diagnosis not present

## 2017-05-29 ENCOUNTER — Other Ambulatory Visit: Payer: Self-pay | Admitting: Internal Medicine

## 2017-05-29 ENCOUNTER — Ambulatory Visit: Payer: Medicare Other

## 2017-05-30 MED ORDER — GABAPENTIN 300 MG PO CAPS: ORAL_CAPSULE | ORAL | 3 refills | Status: DC

## 2017-05-30 MED ORDER — LOPERAMIDE HCL 2 MG PO CAPS: 2.0000 mg | ORAL_CAPSULE | Freq: Every day | ORAL | 0 refills | Status: DC | PRN

## 2017-05-30 MED ORDER — OMEPRAZOLE 40 MG PO CPDR: 40.0000 mg | DELAYED_RELEASE_CAPSULE | Freq: Every day | ORAL | 3 refills | Status: DC

## 2017-05-30 MED ORDER — FUROSEMIDE 40 MG PO TABS: 40.0000 mg | ORAL_TABLET | Freq: Every day | ORAL | 3 refills | Status: DC

## 2017-05-30 MED ORDER — EZETIMIBE 10 MG PO TABS: 10.0000 mg | ORAL_TABLET | Freq: Every day | ORAL | 2 refills | Status: DC

## 2017-06-04 ENCOUNTER — Other Ambulatory Visit: Payer: Self-pay | Admitting: Family Medicine

## 2017-06-04 ENCOUNTER — Ambulatory Visit: Payer: Self-pay | Admitting: Acute Care

## 2017-06-04 ENCOUNTER — Ambulatory Visit: Payer: Self-pay

## 2017-06-04 ENCOUNTER — Encounter: Payer: Self-pay | Admitting: Psychology

## 2017-06-04 MED ORDER — AMLODIPINE BESYLATE 10 MG PO TABS: 10.0000 mg | ORAL_TABLET | Freq: Every day | ORAL | 3 refills | Status: DC

## 2017-06-04 MED ORDER — CITALOPRAM HYDROBROMIDE 20 MG PO TABS: 20.0000 mg | ORAL_TABLET | Freq: Every day | ORAL | 0 refills | Status: DC

## 2017-06-04 MED ORDER — METOPROLOL TARTRATE 25 MG PO TABS: 25.0000 mg | ORAL_TABLET | Freq: Two times a day (BID) | ORAL | 3 refills | Status: DC

## 2017-06-04 NOTE — Progress Notes (Signed)
Tamara Henry did not show up for her appointment today so I called to check on her and left a message that Tamara Henry from [place] was calling to check in.

## 2017-06-06 ENCOUNTER — Encounter (INDEPENDENT_AMBULATORY_CARE_PROVIDER_SITE_OTHER): Payer: Self-pay | Admitting: Surgery

## 2017-06-06 ENCOUNTER — Ambulatory Visit (INDEPENDENT_AMBULATORY_CARE_PROVIDER_SITE_OTHER): Payer: Medicare Other

## 2017-06-06 ENCOUNTER — Ambulatory Visit (INDEPENDENT_AMBULATORY_CARE_PROVIDER_SITE_OTHER): Payer: Medicare Other | Admitting: Surgery

## 2017-06-06 ENCOUNTER — Telehealth: Payer: Self-pay | Admitting: *Deleted

## 2017-06-06 DIAGNOSIS — M545 Low back pain: Secondary | ICD-10-CM

## 2017-06-06 DIAGNOSIS — E113512 Type 2 diabetes mellitus with proliferative diabetic retinopathy with macular edema, left eye: Secondary | ICD-10-CM | POA: Diagnosis not present

## 2017-06-06 DIAGNOSIS — M25512 Pain in left shoulder: Secondary | ICD-10-CM | POA: Diagnosis not present

## 2017-06-06 DIAGNOSIS — G5612 Other lesions of median nerve, left upper limb: Secondary | ICD-10-CM

## 2017-06-06 DIAGNOSIS — I2510 Atherosclerotic heart disease of native coronary artery without angina pectoris: Secondary | ICD-10-CM | POA: Diagnosis not present

## 2017-06-06 DIAGNOSIS — G8929 Other chronic pain: Secondary | ICD-10-CM

## 2017-06-06 DIAGNOSIS — M5416 Radiculopathy, lumbar region: Secondary | ICD-10-CM | POA: Diagnosis not present

## 2017-06-06 LAB — HM DIABETES EYE EXAM

## 2017-06-06 MED ORDER — BUPIVACAINE HCL 0.25 % IJ SOLN
4.0000 mL | INTRAMUSCULAR | Status: AC | PRN
  Administered 2017-06-06: 4 mL via INTRA_ARTICULAR

## 2017-06-06 MED ORDER — LIDOCAINE HCL 1 % IJ SOLN
3.0000 mL | INTRAMUSCULAR | Status: AC | PRN
  Administered 2017-06-06: 3 mL

## 2017-06-06 MED ORDER — METHYLPREDNISOLONE ACETATE 40 MG/ML IJ SUSP
40.0000 mg | INTRAMUSCULAR | Status: AC | PRN
  Administered 2017-06-06: 40 mg via INTRA_ARTICULAR

## 2017-06-06 NOTE — Telephone Encounter (Signed)
Attempted to call patient back. No answer. If she is having a procedure done, her eye doctor will need to instruct her on what to do with her Xarelto.   Smitty Cords, MD [place], PGY-3

## 2017-06-06 NOTE — Progress Notes (Signed)
Office Visit Note   Patient: Tamara Henry           Date of Birth: 1961-04-09           MRN: [id number] Visit Date: 06/06/2017              Requested by: Carlyle Dolly, MD [place], [place] [id number] PCP: Carlyle Dolly, MD   Assessment & Plan: Visit Diagnoses:  1. Chronic right-sided low back pain, with sciatica presence unspecified   2. Chronic left shoulder pain   3. 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 Radiculopathy, lumbar region     Plan: To give patient some relief of her shoulder pain offered injection. Patient sent left shoulder was prepped with Betadine and subacromial injection was done. We'll set she does with this. Regards to her chronic low back pain and right buttock pain I will schedule lumbar MRI to rule out HNP. Failed conservative treatment with previous prednisone taper. If she continues to have ongoing numbness and tingling in left arm with may consider NCV/EMG study. Follow Dr. Lorin Mercy review lumbar MRI.  Follow-Up Instructions: Return in about 3 weeks (around 06/27/2017) for Review lumbar spine MRI with Dr. Lorin Mercy.   Orders:  Orders Placed This Encounter  Procedures  . XR Lumbar Spine 2-3 Views   No orders of the defined types were placed in this encounter.     Procedures: Large Joint Inj Date/Time: 06/06/2017 11:19 AM Performed by: Lanae Crumbly Authorized by: Lanae Crumbly   Consent Given by:  Patient Indications:  Pain Location:  Shoulder Site:  L subacromial bursa Needle Size:  25 G Needle Length:  1.5 inches Approach:  Posterior Ultrasound Guidance: No   Fluoroscopic Guidance: No   Arthrogram: No   Medications:  3 mL lidocaine 1 %; 4 mL bupivacaine 0.25 %; 40 mg methylPREDNISolone acetate 40 MG/ML Aspiration Attempted: No   Patient tolerance:  Patient tolerated the procedure well with no immediate complications  Patient reported good relief of shoulder pain after sitting for a few minutes     Clinical  Data: No additional findings.   Subjective: Chief Complaint  Patient presents with  . Left Shoulder - Pain, Follow-up  . Lower Back - Pain    HPI Patient returns today with complaints of left shoulder pain, left arm numbness tingling or burning and low back pain and right buttock pain. Patient is status post rotator cuff repair by Dr. Lorin Mercy June 2018. She seems to have some soreness with overactivity and reach behind her back. Patient also recently had cervical ESI due to issues with left shoulder and arm. States that she did have some improvement of left arm burning numbness tingling. Radicular symptoms are not constant. Patient states that she is also had chronic low back pain radiating into her right buttock and hip. Has been worse since her fall February 2018,  patient also describes having neurogenic claudication symptoms and walking distances have greatly decreased. Does have to lean forward over a grocery cart to help walk further in the store.    8.Review of Systems no cardiac pulmonary GI GU issues      Objective: Vital Signs: There were no vitals taken for this visit.  Physical Exam  Constitutional: She is oriented to person, place, and time. She appears well-developed. No distress.  HENT:  Head: Normocephalic and atraumatic.  Eyes: Pupils are equal, round, and reactive to light. EOM are normal.  Neck: Normal range  of motion.  No brachial plexus or trapezius tenderness  Pulmonary/Chest: No respiratory distress.  Musculoskeletal:   Left shoulder range of motion about160 flexion/abduction with discomfort. Positive impingement tes. negtive drop arm.  Bilateral elbows good range of motion. Negative Tinel's over the bilateral cubital and carpal tunnels.  Negative elbow flexion test.  Neurological: She is alert and oriented to person, place, and time.  Skin: Skin is warm and dry.  Psychiatric: She has a normal mood and affect.    Ortho Exam  Specialty Comments:  No  specialty comments available.  Imaging: Xr Lumbar Spine 2-3 Views  Result Date: 06/06/2017 X-rays lumbar spine show multilevel degenerative disc disease. No acute findings.    PMFS History: Patient Active Problem List   Diagnosis Date Noted  . OSA on CPAP 05/03/2017  . Morbid obesity due to excess calories ([place]) 05/03/2017  . Vaginal discharge 03/27/2017  . Microscopic hematuria 03/27/2017  . Impingement syndrome of left shoulder 02/19/2017  . Hypersomnia 12/12/2016  . REM behavioral disorder 12/12/2016  . Asthma 12/12/2016  . Flatulence 11/17/2016  . Neck pain 11/17/2016  . CKD (chronic kidney disease), stage III 11/02/2016  . At risk for sleep apnea 11/01/2016  . CAD (coronary artery disease), native coronary artery 10/26/2016  . Encounter for post surgical wound check 10/23/2016  . Poor social situation 10/13/2016  . Anemia 08/30/2016  . Leg pain 08/30/2016  . Bilateral low back pain without sciatica 08/30/2016  . 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 Bilateral pulmonary embolism ([place]) 05/17/2016  . Hyperlipidemia 05/17/2016  . Depression 05/17/2016  . Diabetic peripheral neuropathy ([place]) 05/17/2016  . Neuritis of upper extremity, C7 01/15/2015  . DM type 2, uncontrolled, with renal complications ([employer]) 40/98/1191  . Hypertension 01/14/2015  . Numbness and tingling of right arm 01/14/2015   Past Medical History:  Diagnosis Date  . Acid reflux   . Anxiety   . Arthritis    back  . Asthma   . Back pain   . Bilateral pulmonary embolism ([place]) 05/2016  . Bipolar disorder ([place])   . CAD in native artery    S/P NSTEMI with cath showing severe 3 vessel ASCAD s/p CABGx4 07/28/16.  . Cataracts, bilateral   . CKD (chronic kidney disease), stage III    borderline CKD II-III  . Cocaine abuse    In remission. Stopped using in early 2000's  . Depression   . Diabetes mellitus ([place])    Type II  . GERD (gastroesophageal reflux disease)   . Glaucoma   . High cholesterol   . History  of blood transfusion   . 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 Hx of chest tube placement right   . Hypertension   . Morbid obesity ([place])   . Myocardial infarction ([place])   . Neuropathy    hands and feet  . Pneumonia   . Postoperative anemia 07/2016  . PTSD (post-traumatic stress disorder)   . Stroke [facility]) 2005   no residual    Family History  Problem Relation Age of Onset  . Pancreatitis Mother   . Diabetes type II Mother   . CAD Father   . Diabetes type II Father   . Diabetes type II Sister   . Diabetes type II Brother     Past Surgical History:  Procedure Laterality Date  . CARDIAC CATHETERIZATION N/A 07/25/2016   Procedure: Left Heart Cath and Coronary Angiography;  Surgeon: Peter M Martinique, MD;  Location: [facility];  Service: Cardiovascular;  Laterality: N/A;  . COLONOSCOPY W/ POLYPECTOMY    . CORONARY ARTERY BYPASS GRAFT N/A 07/28/2016   Procedure: CORONARY ARTERY BYPASS GRAFTING (CABG)x4 using left internal mammary and endoscopic harvest of right greater saphenous vein;  Surgeon: Ivin Poot, MD;  Location: [facility];  Service: Open Heart Surgery;  Laterality: N/A;  . DENTAL SURGERY     to removed broken teeth  . SHOULDER ARTHROSCOPY WITH ROTATOR CUFF REPAIR Left 02/19/2017   Procedure: LEFT SHOULDER ARTHROSCOPY with debridement andROTATOR CUFF REPAIR;  Surgeon: [name], MD;  Location: [facility];  Service: Orthopedics;  Laterality: Left;  . TEE WITHOUT CARDIOVERSION N/A 07/28/2016   Procedure: TRANSESOPHAGEAL ECHOCARDIOGRAM (TEE);  Surgeon: Ivin Poot, MD;  Location: [facility];  Service: Open Heart Surgery;  Laterality: N/A;  . TUBAL LIGATION     Social History   Occupational History  . disabled    Social History Main Topics  . Smoking status: Former Smoker    Quit date: 09/11/2005  . Smokeless tobacco: Never Used  . Alcohol use No  . Drug use: No  . Sexual activity: Yes    Birth control/ protection: Surgical

## 2017-06-06 NOTE — Telephone Encounter (Signed)
Patient left message on nurse line requesting to speak with PCP regarding her blood thinner medication. She was on her way to eye doctor for an injection. When she received the injection before and took her blood thinner her eye was blood shot. Please give her a call. Number on file is correct.  Derl Barrow, RN

## 2017-06-07 ENCOUNTER — Telehealth (INDEPENDENT_AMBULATORY_CARE_PROVIDER_SITE_OTHER): Payer: Self-pay | Admitting: *Deleted

## 2017-06-07 NOTE — Telephone Encounter (Signed)
Pt is scheduled for MRI at [facility] on Wed Oct 3 at 5:00pm, pt is to arrive at 4:45pm to register, [facility] to pt to schedule appt. Pending call back

## 2017-06-08 NOTE — Telephone Encounter (Signed)
Pt aware of appt.

## 2017-06-11 ENCOUNTER — Ambulatory Visit: Payer: Self-pay

## 2017-06-11 ENCOUNTER — Encounter: Payer: Self-pay | Admitting: Psychology

## 2017-06-11 NOTE — Progress Notes (Signed)
Dream missed her appointment today so I called to check on her. No one answered the phone so I left a message that Tamara Henry from [place] was calling to check in.

## 2017-06-13 ENCOUNTER — Ambulatory Visit (HOSPITAL_COMMUNITY): Payer: Medicare Other

## 2017-06-13 ENCOUNTER — Ambulatory Visit (INDEPENDENT_AMBULATORY_CARE_PROVIDER_SITE_OTHER): Payer: Medicare Other | Admitting: Orthotics

## 2017-06-13 DIAGNOSIS — M2041 Other hammer toe(s) (acquired), right foot: Secondary | ICD-10-CM

## 2017-06-13 DIAGNOSIS — M2042 Other hammer toe(s) (acquired), left foot: Secondary | ICD-10-CM

## 2017-06-13 DIAGNOSIS — E1149 Type 2 diabetes mellitus with other diabetic neurological complication: Secondary | ICD-10-CM

## 2017-06-13 DIAGNOSIS — E1140 Type 2 diabetes mellitus with diabetic neuropathy, unspecified: Secondary | ICD-10-CM

## 2017-06-13 DIAGNOSIS — Z872 Personal history of diseases of the skin and subcutaneous tissue: Secondary | ICD-10-CM | POA: Diagnosis not present

## 2017-06-14 ENCOUNTER — Ambulatory Visit: Payer: Medicare Other | Admitting: Podiatry

## 2017-06-14 DIAGNOSIS — R269 Unspecified abnormalities of gait and mobility: Secondary | ICD-10-CM | POA: Diagnosis not present

## 2017-06-14 DIAGNOSIS — G4733 Obstructive sleep apnea (adult) (pediatric): Secondary | ICD-10-CM | POA: Diagnosis not present

## 2017-06-15 ENCOUNTER — Ambulatory Visit (HOSPITAL_COMMUNITY)
Admission: RE | Admit: 2017-06-15 | Discharge: 2017-06-15 | Disposition: A | Discharge status: Home / Self Care | Payer: Medicare Other | Source: Ambulatory Visit | Attending: Surgery | Admitting: Surgery

## 2017-06-15 DIAGNOSIS — M5127 Other intervertebral disc displacement, lumbosacral region: Secondary | ICD-10-CM | POA: Insufficient documentation

## 2017-06-15 DIAGNOSIS — M5116 Intervertebral disc disorders with radiculopathy, lumbar region: Secondary | ICD-10-CM | POA: Diagnosis not present

## 2017-06-15 DIAGNOSIS — M545 Low back pain: Secondary | ICD-10-CM | POA: Diagnosis not present

## 2017-06-15 DIAGNOSIS — M48061 Spinal stenosis, lumbar region without neurogenic claudication: Secondary | ICD-10-CM | POA: Diagnosis not present

## 2017-06-15 DIAGNOSIS — M5416 Radiculopathy, lumbar region: Secondary | ICD-10-CM

## 2017-06-17 ENCOUNTER — Encounter (HOSPITAL_COMMUNITY): Payer: Self-pay | Admitting: *Deleted

## 2017-06-17 ENCOUNTER — Observation Stay (HOSPITAL_COMMUNITY)
Admission: EM | Admit: 2017-06-17 | Discharge: 2017-06-18 | Disposition: A | Discharge status: Home / Self Care | Payer: Medicare Other | Attending: Family Medicine | Admitting: Family Medicine

## 2017-06-17 ENCOUNTER — Emergency Department (HOSPITAL_COMMUNITY): Payer: Medicare Other

## 2017-06-17 ENCOUNTER — Emergency Department (HOSPITAL_BASED_OUTPATIENT_CLINIC_OR_DEPARTMENT_OTHER)
Admit: 2017-06-17 | Discharge: 2017-06-17 | Disposition: A | Discharge status: Home / Self Care | Payer: Medicare Other | Attending: Emergency Medicine | Admitting: Emergency Medicine

## 2017-06-17 DIAGNOSIS — R9431 Abnormal electrocardiogram [ECG] [EKG]: Secondary | ICD-10-CM | POA: Diagnosis not present

## 2017-06-17 DIAGNOSIS — Z86711 Personal history of pulmonary embolism: Secondary | ICD-10-CM | POA: Insufficient documentation

## 2017-06-17 DIAGNOSIS — M79621 Pain in right upper arm: Principal | ICD-10-CM | POA: Insufficient documentation

## 2017-06-17 DIAGNOSIS — Z79899 Other long term (current) drug therapy: Secondary | ICD-10-CM | POA: Diagnosis not present

## 2017-06-17 DIAGNOSIS — R197 Diarrhea, unspecified: Secondary | ICD-10-CM | POA: Insufficient documentation

## 2017-06-17 DIAGNOSIS — E1142 Type 2 diabetes mellitus with diabetic polyneuropathy: Secondary | ICD-10-CM | POA: Diagnosis not present

## 2017-06-17 DIAGNOSIS — Z881 Allergy status to other antibiotic agents status: Secondary | ICD-10-CM | POA: Insufficient documentation

## 2017-06-17 DIAGNOSIS — F4310 Post-traumatic stress disorder, unspecified: Secondary | ICD-10-CM | POA: Insufficient documentation

## 2017-06-17 DIAGNOSIS — Z7982 Long term (current) use of aspirin: Secondary | ICD-10-CM | POA: Diagnosis not present

## 2017-06-17 DIAGNOSIS — Z6841 Body Mass Index (BMI) 40.0 and over, adult: Secondary | ICD-10-CM | POA: Diagnosis not present

## 2017-06-17 DIAGNOSIS — Z794 Long term (current) use of insulin: Secondary | ICD-10-CM | POA: Insufficient documentation

## 2017-06-17 DIAGNOSIS — G4733 Obstructive sleep apnea (adult) (pediatric): Secondary | ICD-10-CM | POA: Insufficient documentation

## 2017-06-17 DIAGNOSIS — M79609 Pain in unspecified limb: Secondary | ICD-10-CM | POA: Diagnosis not present

## 2017-06-17 DIAGNOSIS — I252 Old myocardial infarction: Secondary | ICD-10-CM | POA: Diagnosis not present

## 2017-06-17 DIAGNOSIS — Z880 Allergy status to penicillin: Secondary | ICD-10-CM | POA: Insufficient documentation

## 2017-06-17 DIAGNOSIS — I2510 Atherosclerotic heart disease of native coronary artery without angina pectoris: Secondary | ICD-10-CM | POA: Diagnosis not present

## 2017-06-17 DIAGNOSIS — E1122 Type 2 diabetes mellitus with diabetic chronic kidney disease: Secondary | ICD-10-CM | POA: Diagnosis not present

## 2017-06-17 DIAGNOSIS — M79601 Pain in right arm: Secondary | ICD-10-CM | POA: Diagnosis not present

## 2017-06-17 DIAGNOSIS — K219 Gastro-esophageal reflux disease without esophagitis: Secondary | ICD-10-CM | POA: Diagnosis not present

## 2017-06-17 DIAGNOSIS — F319 Bipolar disorder, unspecified: Secondary | ICD-10-CM | POA: Insufficient documentation

## 2017-06-17 DIAGNOSIS — Z87891 Personal history of nicotine dependence: Secondary | ICD-10-CM | POA: Insufficient documentation

## 2017-06-17 DIAGNOSIS — Z7901 Long term (current) use of anticoagulants: Secondary | ICD-10-CM | POA: Diagnosis not present

## 2017-06-17 DIAGNOSIS — Z91040 Latex allergy status: Secondary | ICD-10-CM | POA: Insufficient documentation

## 2017-06-17 DIAGNOSIS — I129 Hypertensive chronic kidney disease with stage 1 through stage 4 chronic kidney disease, or unspecified chronic kidney disease: Secondary | ICD-10-CM | POA: Insufficient documentation

## 2017-06-17 DIAGNOSIS — Z951 Presence of aortocoronary bypass graft: Secondary | ICD-10-CM | POA: Insufficient documentation

## 2017-06-17 DIAGNOSIS — M1990 Unspecified osteoarthritis, unspecified site: Secondary | ICD-10-CM | POA: Insufficient documentation

## 2017-06-17 DIAGNOSIS — Z8673 Personal history of transient ischemic attack (TIA), and cerebral infarction without residual deficits: Secondary | ICD-10-CM | POA: Diagnosis not present

## 2017-06-17 DIAGNOSIS — Z882 Allergy status to sulfonamides status: Secondary | ICD-10-CM | POA: Insufficient documentation

## 2017-06-17 DIAGNOSIS — R079 Chest pain, unspecified: Secondary | ICD-10-CM | POA: Diagnosis not present

## 2017-06-17 DIAGNOSIS — E7800 Pure hypercholesterolemia, unspecified: Secondary | ICD-10-CM | POA: Diagnosis not present

## 2017-06-17 DIAGNOSIS — N183 Chronic kidney disease, stage 3 (moderate): Secondary | ICD-10-CM | POA: Insufficient documentation

## 2017-06-17 DIAGNOSIS — Z888 Allergy status to other drugs, medicaments and biological substances status: Secondary | ICD-10-CM | POA: Insufficient documentation

## 2017-06-17 LAB — CBC
HCT: 41.2 % (ref 36.0–46.0)
Hemoglobin: 12.5 g/dL (ref 12.0–15.0)
MCH: 26.4 pg (ref 26.0–34.0)
MCHC: 30.3 g/dL (ref 30.0–36.0)
MCV: 86.9 fL (ref 78.0–100.0)
Platelets: 267 10*3/uL (ref 150–400)
RBC: 4.74 MIL/uL (ref 3.87–5.11)
RDW: 15.6 % — AB (ref 11.5–15.5)
WBC: 8 10*3/uL (ref 4.0–10.5)

## 2017-06-17 LAB — PROTIME-INR
INR: 1.3
Prothrombin Time: 16.1 seconds — ABNORMAL HIGH (ref 11.4–15.2)

## 2017-06-17 LAB — BASIC METABOLIC PANEL
Anion gap: 9 (ref 5–15)
BUN: 18 mg/dL (ref 6–20)
CALCIUM: 8.8 mg/dL — AB (ref 8.9–10.3)
CO2: 23 mmol/L (ref 22–32)
CREATININE: 1.37 mg/dL — AB (ref 0.44–1.00)
Chloride: 109 mmol/L (ref 101–111)
GFR calc non Af Amer: 43 mL/min — ABNORMAL LOW (ref >60)
GFR, EST AFRICAN AMERICAN: 50 mL/min — AB (ref >60)
Glucose, Bld: 75 mg/dL (ref 65–99)
Potassium: 4.1 mmol/L (ref 3.5–5.1)
SODIUM: 141 mmol/L (ref 135–145)

## 2017-06-17 LAB — I-STAT TROPONIN, ED: TROPONIN I, POC: 0 ng/mL (ref 0.00–0.08)

## 2017-06-17 LAB — GLUCOSE, CAPILLARY: GLUCOSE-CAPILLARY: 180 mg/dL — AB (ref 65–99)

## 2017-06-17 MED ORDER — HEPARIN (PORCINE) IN NACL 100-0.45 UNIT/ML-% IJ SOLN
1150.0000 [IU]/h | INTRAMUSCULAR | Status: DC
  Administered 2017-06-17: 1150 [IU]/h via INTRAVENOUS
  Filled 2017-06-17: qty 250

## 2017-06-17 MED ORDER — LOPERAMIDE HCL 2 MG PO CAPS
2.0000 mg | ORAL_CAPSULE | Freq: Every day | ORAL | Status: DC | PRN
  Administered 2017-06-17: 2 mg via ORAL
  Filled 2017-06-17: qty 1

## 2017-06-17 MED ORDER — CITALOPRAM HYDROBROMIDE 20 MG PO TABS
20.0000 mg | ORAL_TABLET | Freq: Every day | ORAL | Status: DC
  Administered 2017-06-18: 20 mg via ORAL
  Filled 2017-06-17: qty 1

## 2017-06-17 MED ORDER — INSULIN GLARGINE 100 UNIT/ML ~~LOC~~ SOLN
30.0000 [IU] | Freq: Every day | SUBCUTANEOUS | Status: DC
  Administered 2017-06-17: 30 [IU] via SUBCUTANEOUS
  Filled 2017-06-17 (×2): qty 0.3

## 2017-06-17 MED ORDER — MELATONIN 3 MG PO TABS
6.0000 mg | ORAL_TABLET | Freq: Every day | ORAL | Status: DC
  Administered 2017-06-17: 6 mg via ORAL
  Filled 2017-06-17: qty 2

## 2017-06-17 MED ORDER — ASPIRIN 81 MG PO CHEW
324.0000 mg | CHEWABLE_TABLET | Freq: Once | ORAL | Status: AC
  Administered 2017-06-17: 324 mg via ORAL
  Filled 2017-06-17: qty 4

## 2017-06-17 MED ORDER — INSULIN ASPART 100 UNIT/ML ~~LOC~~ SOLN
0.0000 [IU] | Freq: Three times a day (TID) | SUBCUTANEOUS | Status: DC
  Administered 2017-06-18: 5 [IU] via SUBCUTANEOUS

## 2017-06-17 MED ORDER — ACETAMINOPHEN 325 MG PO TABS: 650.0000 mg | ORAL_TABLET | ORAL | Status: DC | PRN

## 2017-06-17 MED ORDER — PANTOPRAZOLE SODIUM 40 MG PO TBEC
40.0000 mg | DELAYED_RELEASE_TABLET | Freq: Every day | ORAL | Status: DC
  Administered 2017-06-18: 40 mg via ORAL
  Filled 2017-06-17: qty 1

## 2017-06-17 MED ORDER — ATORVASTATIN CALCIUM 80 MG PO TABS
80.0000 mg | ORAL_TABLET | Freq: Every evening | ORAL | Status: DC
  Administered 2017-06-17: 80 mg via ORAL
  Filled 2017-06-17: qty 1

## 2017-06-17 MED ORDER — FUROSEMIDE 40 MG PO TABS
40.0000 mg | ORAL_TABLET | Freq: Every day | ORAL | Status: DC
  Administered 2017-06-18: 40 mg via ORAL
  Filled 2017-06-17: qty 1

## 2017-06-17 MED ORDER — METOPROLOL TARTRATE 25 MG PO TABS
25.0000 mg | ORAL_TABLET | Freq: Two times a day (BID) | ORAL | Status: DC
  Administered 2017-06-17 – 2017-06-18 (×2): 25 mg via ORAL
  Filled 2017-06-17 (×2): qty 1

## 2017-06-17 MED ORDER — INSULIN ASPART 100 UNIT/ML ~~LOC~~ SOLN: 0.0000 [IU] | Freq: Every day | SUBCUTANEOUS | Status: DC

## 2017-06-17 MED ORDER — MORPHINE SULFATE (PF) 4 MG/ML IV SOLN: 2.0000 mg | INTRAVENOUS | Status: DC | PRN

## 2017-06-17 MED ORDER — GABAPENTIN 300 MG PO CAPS
900.0000 mg | ORAL_CAPSULE | Freq: Two times a day (BID) | ORAL | Status: DC
  Administered 2017-06-17 – 2017-06-18 (×2): 900 mg via ORAL
  Filled 2017-06-17 (×2): qty 3

## 2017-06-17 MED ORDER — ONDANSETRON HCL 4 MG/2ML IJ SOLN: 4.0000 mg | Freq: Four times a day (QID) | INTRAMUSCULAR | Status: DC | PRN

## 2017-06-17 MED ORDER — AMLODIPINE BESYLATE 10 MG PO TABS
10.0000 mg | ORAL_TABLET | Freq: Every day | ORAL | Status: DC
  Administered 2017-06-18: 10 mg via ORAL
  Filled 2017-06-17: qty 1

## 2017-06-17 NOTE — ED Notes (Signed)
Patient transported to X-ray 

## 2017-06-17 NOTE — ED Triage Notes (Signed)
Pt reports waking up this am with severe pain to right upper arm, and numbness sensation to left arm. No obv neuro deficits noted at triage. Hx of blood clots.

## 2017-06-17 NOTE — H&P (Signed)
[facility] Admission History and Physical Service Pager: [phone]  Patient name: Tamara Henry Medical record number: [id number] Date of birth: [birth year] Age: 56 y.o. Gender: female  Primary Care Provider: Carlyle Dolly, MD Consultants: none Code Status: Full (obtained on admission)  Chief Complaint: Right arm pain  Assessment and Plan: Tamara Henry is a 56 y.o. female presenting with right arm pain . PMH is significant for CAD s/p CABG, PE, HTN, DM, CKD  Right arm pain: Likely musculoskeletal. She is tender to palpation over the body of biceps muscle. Negative Yergason's and resisted supination. No findings suggestive for cellulitis or upper arm DVT. She is on Xarelto 10 mg daily. However, she reports history of PICC line in the right arm.  -We will admit to telemetry for observation. Attending Dr. Nori Riis. -Obtain DG right humerus -Obtain DVT Doppler right extremity -Rule out ACS as below.  History of CAD s/p CABG 4. She is axntious that her right arm is due to heart attack and couldn't be reassured. She has no cardiopulmonary symptoms. -We will cycle troponins and EKG to rule out this. -Continue metoprolol, atorvastatin and aspirin.  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 Has been on Xarelto 10 mg. Reports good compliance with his medication. -hold  Xarelto -Start heparin gtt until we rule out ACS and right arm issue  Diarrhea: Reports 3 episodes of watery diarrhea since arrival in ED. No fever or leukocytosis suggestive for infectious etiology. No recent antibiotic or laxative use. She is on Zetia which could be contributing. She uses Imodium at home. -Hold Zetia -Continue home Imodium  OSA: On nightly CPAP at home -Continue nightly CPAP  Hypertension: Normotensive. -Continue home amlodipine & metoprolol  Diabetes: Last A1c 10.1 on 05/17/2017. BMP blood glucose 75. She is on Lantus 50 units nightly and NovoLog 5  units twice a day with meals. -Lantus 30 units nightly -SSI-thin with at bedtime coverage -CBG before meals and at bedtime  CKD-3A: Stable. Creatinine 1.37 on admission. This line 1.3-1.5. -We'll continue monitoring BMP as needed  Diabetic peripheral neuropathy:  -Continue home gabapentin  History of depression/insomnia: Stable. Denies SI/HI -Continue home citalopram and nightly melatonin  FEN/GI:  -Carb modified and heart healthy diet  Prophylaxis: -Heparin gtt. We will transition back to Xarelto once workup is negative  Disposition: Observe in med-surg with cardiac monitor for further evaluation of right arm and to rule out ACS  History of Present Illness:  Tamara Henry is a 56 y.o. female presenting with right arm pain.  Patient reports having right arm pain 7 days ago. Pain went away and returned last night when she was lying down. She is right-handed. She is a retired Building control surveyor. She describes the pain as deep and stabbing. Pain is intermittent. Pain is gradually getting worse. She is worried because she had similar pain on the left side when she had a heart attack about a year ago. She denies chest pain, dyspnea, fever, nausea, vomiting, diaphoresis, edema, arm swelling or redness. Patient denies unusual activity recently. She has history of PE and DVT about a year ago. She is still on Xarelto 10 mg daily. She says last dose was this morning. She reports good compliance with the medication. She reports history of PICC line in her right arm about 10 months ago when she had myocardial infarction.  She also reports headache since last night. She described the headache as tightness. She denies vision change, focal numbness, tingling or  weakness. Off note, she also reports diarrhea. She reports 3 bowel movements with watery stool. She denies abdominal pain, dysuria or blood in stool. She reports having diarrhea at baseline. She is on Imodium at home.  ED course: Vital signs within normal  limits. CBC within normal limits. BMP significant for creatinine to 1.35 otherwise within normal limits. Initial troponin and EKG within normal limits. CXR without acute cardiopulmonary finding. She was given aspirin 324 mg once. Family medicine was paged to admit patient for ACS rule out given risk factor and atypical  history of MI when she presented with left shoulder pain about a year ago.    Review of Systems  Constitutional: Negative for fever and weight loss.  HENT: Negative for sore throat.   Eyes: Negative for blurred vision, photophobia and pain.  Respiratory: Negative for cough and shortness of breath.   Cardiovascular: Negative for chest pain, palpitations, orthopnea and leg swelling.  Gastrointestinal: Positive for diarrhea. Negative for abdominal pain, blood in stool, melena and vomiting.  Genitourinary: Negative for dysuria and hematuria.  Musculoskeletal: Positive for myalgias.       Right arm pain  Skin: Negative for rash.  Neurological: Negative for speech change, focal weakness, weakness and headaches.  Endo/Heme/Allergies: Does not bruise/bleed easily.  Psychiatric/Behavioral: Negative for depression and substance abuse. The patient is not nervous/anxious.    Patient Active Problem List   Diagnosis Date Noted  . OSA on CPAP 05/03/2017  . Morbid obesity due to excess calories ([place]) 05/03/2017  . Vaginal discharge 03/27/2017  . Microscopic hematuria 03/27/2017  . Impingement syndrome of left shoulder 02/19/2017  . Hypersomnia 12/12/2016  . REM behavioral disorder 12/12/2016  . Asthma 12/12/2016  . Flatulence 11/17/2016  . Neck pain 11/17/2016  . CKD (chronic kidney disease), stage III ([place]) 11/02/2016  . At risk for sleep apnea 11/01/2016  . CAD (coronary artery disease), native coronary artery 10/26/2016  . Encounter for post surgical wound check 10/23/2016  . Poor social situation 10/13/2016  . Anemia 08/30/2016  . Leg pain 08/30/2016  . Bilateral low back  pain without sciatica 08/30/2016  . 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 Bilateral pulmonary embolism ([place]) 05/17/2016  . Hyperlipidemia 05/17/2016  . Depression 05/17/2016  . Diabetic peripheral neuropathy ([place]) 05/17/2016  . Neuritis of upper extremity, C7 01/15/2015  . DM type 2, uncontrolled, with renal complications ([place]) 08/65/7846  . Hypertension 01/14/2015  . Numbness and tingling of right arm 01/14/2015    Past Medical History: Past Medical History:  Diagnosis Date  . Acid reflux   . Anxiety   . Arthritis    back  . Asthma   . Back pain   . Bilateral pulmonary embolism ([place]) 05/2016  . Bipolar disorder ([place])   . CAD in native artery    S/P NSTEMI with cath showing severe 3 vessel ASCAD s/p CABGx4 07/28/16.  . Cataracts, bilateral   . CKD (chronic kidney disease), stage III ([facility])    borderline CKD II-III  . Cocaine abuse ([place])    In remission. Stopped using in early 2000's  . Depression   . Diabetes mellitus ([place])    Type II  . GERD (gastroesophageal reflux disease)   . Glaucoma   . High cholesterol   . History of blood transfusion   . 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 Hx of chest tube placement right   . Hypertension   . Morbid obesity ([place])   . Myocardial infarction ([place])   . Neuropathy  hands and feet  . Pneumonia   . Postoperative anemia 07/2016  . PTSD (post-traumatic stress disorder)   . Stroke [facility]) 2005   no residual    Past Surgical History: Past Surgical History:  Procedure Laterality Date  . CARDIAC CATHETERIZATION N/A 07/25/2016   Procedure: Left Heart Cath and Coronary Angiography;  Surgeon: Peter M Martinique, MD;  Location: [place];  Service: Cardiovascular;  Laterality: N/A;  . COLONOSCOPY W/ POLYPECTOMY    . CORONARY ARTERY BYPASS GRAFT N/A 07/28/2016   Procedure: CORONARY ARTERY BYPASS GRAFTING (CABG)x4 using left internal mammary and endoscopic harvest of right greater saphenous vein;  Surgeon: Ivin Poot, MD;  Location: [facility];  Service:  Open Heart Surgery;  Laterality: N/A;  . DENTAL SURGERY     to removed broken teeth  . SHOULDER ARTHROSCOPY WITH ROTATOR CUFF REPAIR Left 02/19/2017   Procedure: LEFT SHOULDER ARTHROSCOPY with debridement andROTATOR CUFF REPAIR;  Surgeon: [name], MD;  Location: [place];  Service: Orthopedics;  Laterality: Left;  . TEE WITHOUT CARDIOVERSION N/A 07/28/2016   Procedure: TRANSESOPHAGEAL ECHOCARDIOGRAM (TEE);  Surgeon: Ivin Poot, MD;  Location: [place];  Service: Open Heart Surgery;  Laterality: N/A;  . TUBAL LIGATION      Social History: Social History  Substance Use Topics  . Smoking status: Former Smoker    Quit date: 09/11/2005  . Smokeless tobacco: Never Used  . Alcohol use No   Additional social history: per HPI  Please also refer to relevant sections of EMR.  Family History: Family History  Problem Relation Age of Onset  . Pancreatitis Mother   . Diabetes type II Mother   . CAD Father   . Diabetes type II Father   . Diabetes type II Sister   . Diabetes type II Brother    (If not completed, MUST add something in)  Allergies and Medications: Allergies  Allergen Reactions  . Lactose Intolerance (Gi) Diarrhea  . Lisinopril Other (See Comments) and Cough    Inflammation, coughing  . Reglan [Metoclopramide] Other (See Comments)    REACTION IS SIDE EFFECT Pt stated having lock jaw as a side effect  . Wellbutrin [Bupropion] Nausea And Vomiting and Cough  . Latex Rash  . Metformin And Related Diarrhea  . Penicillins Rash    [FROM PREVIOUS ENTRY-BEFORE 07/27/16] >>"ALL CILLINS"  PATIENT HAD A PCN REACTION WITH IMMEDIATE RASH, FACIAL/TONGUE/THROAT SWELLING, SOB, OR LIGHTHEADEDNESS WITH HYPOTENSION:  #  #  #  YES  #  #  #   Has patient had a PCN reaction causing severe rash involving mucus membranes or skin necrosis: No Has patient had a PCN reaction that required hospitalization No Has patient had a PCN reaction occurring within the last 10 years:   #  #  #  YES  #  #   #   . Sulfa Antibiotics Itching  . Tape Rash   No current facility-administered medications on file prior to encounter.    Current Outpatient Prescriptions on File Prior to Encounter  Medication Sig Dispense Refill  . amLODipine (NORVASC) 10 MG tablet Take 1 tablet (10 mg total) by mouth daily. 90 tablet 3  . aspirin 81 MG EC tablet Take 1 tablet (81 mg total) by mouth daily.    Marland Kitchen atorvastatin (LIPITOR) 80 MG tablet Take 1 tablet (80 mg total) by mouth every evening. 90 tablet 3  . citalopram (CELEXA) 20 MG tablet Take 1 tablet (20 mg total) by mouth daily.  30 tablet 0  . ezetimibe (ZETIA) 10 MG tablet Take 1 tablet (10 mg total) by mouth daily. 30 tablet 2  . ferrous sulfate 325 (65 FE) MG tablet Take 1 tablet (325 mg total) by mouth daily with breakfast. 30 tablet 3  . folic acid (FOLVITE) 366 MCG tablet Take 400 mcg by mouth daily.    . furosemide (LASIX) 40 MG tablet Take 1 tablet (40 mg total) by mouth daily. 30 tablet 3  . gabapentin (NEURONTIN) 300 MG capsule Take 900 mg by mouth in the morning, 600 mg at lunch, and 900 mg at bedtime 240 capsule 3  . HYDROcodone-acetaminophen (NORCO/VICODIN) 5-325 MG tablet Take 1 tablet by mouth 3 (three) times daily as needed for moderate pain. 30 tablet 0  . insulin aspart (NOVOLOG) 100 UNIT/ML injection Inject 5 Units into the skin 2 (two) times daily before a meal. 10 mL 3  . Insulin Glargine (BASAGLAR KWIKPEN) 100 UNIT/ML SOPN INJECT 50 UNITS INTO SKIN EACH NIGHT AT BEDTIME 3 pen 2  . Melatonin 3 MG TABS Take 6 mg by mouth at bedtime.    . metoprolol tartrate (LOPRESSOR) 25 MG tablet Take 1 tablet (25 mg total) by mouth 2 (two) times daily. 60 tablet 3  . Multiple Vitamins-Minerals (MULTIVITAMIN) tablet Take 1 tablet by mouth daily. 30 tablet 5  . omeprazole (PRILOSEC) 40 MG capsule Take 1 capsule (40 mg total) by mouth daily. 30 capsule 3  . potassium chloride SA (K-DUR,KLOR-CON) 20 MEQ tablet take half a TABLET BY MOUTH EVERY MORNING (Patient  taking differently: Take 34meq by mouthEVERY MORNING) 14 tablet 2  . Probiotic Product (ALIGN) 4 MG CAPS Take 1 capsule (4 mg total) by mouth daily. 30 capsule 2  . rivaroxaban (XARELTO) 10 MG TABS tablet Take 1 tablet (10 mg total) by mouth every morning. 30 tablet 1  . tiZANidine (ZANAFLEX) 4 MG tablet TAKE 1 TABLET BY MOUTH EVERY 8 HOURS AS NEEDED FOR MUSCLE SPASMS (Patient taking differently: TAKE 4mg  BY MOUTH EVERY 8 HOURS AS NEEDED FOR MUSCLE SPASMS) 30 tablet 1  . loperamide (IMODIUM) 2 MG capsule Take 1 capsule (2 mg total) by mouth daily as needed for diarrhea or loose stools. 30 capsule 0    Objective: BP 125/77   Pulse 73   Temp 97.9 F (36.6 C) (Oral)   Resp 14   LMP 05/18/2017   SpO2 97%  Exam: GEN: appears well, no apparent distress. Head: normocephalic and atraumatic  Eyes: conjunctiva without injection, sclera anicteric. Minimal pupillary reflex to light likely due to cataract. EOMI Nares: no rhinorrhea, congestion or erythema Oropharynx: mmm without erythema or exudation HEM: negative for cervical or periauricular lymphadenopathies CVS: RRR, nl s1 & s2, no murmurs, no edema,  2+ DP & PT pulses bilaterally RESP: no IWOB, good air movement bilaterally, CTAB GI: BS present & normal, soft, NTND GU: no suprapubic or CVA tenderness MSK: Tenderness to palpation over the body of right biceps, full range of motion in her elbow and shoulder, Yergason's and rested supination negative.  SKIN: no apparent skin lesion ENDO: negative thyromegally NEURO: alert and oiented appropriately, CN II-12 intact except for minimal pupillary reflex bilaterally due to cataract, motor 5/5 in all extremities, light sensation intact in all dermatomes of upper and lower extremities.  PSYCH: euthymic mood with congruent affect. Denies SI/HI  Labs and Imaging: CBC BMET   Recent Labs Lab 06/17/17 1222  WBC 8.0  HGB 12.5  HCT 41.2  PLT 267  Recent Labs Lab 06/17/17 1222  NA 141  K 4.1   CL 109  CO2 23  BUN 18  CREATININE 1.37*  GLUCOSE 75  CALCIUM 8.8*    Dg Chest 2 View  Result Date: 06/17/2017 CLINICAL DATA:  56 year old female with right arm pain EXAM: CHEST  2 VIEW COMPARISON:  Prior chest x-ray 10/21/2016 FINDINGS: Cardiac and mediastinal contours are within normal limits. Patient is status post median sternotomy with evidence of prior multivessel CABG including LIMA bypass. The lungs are clear. Stable fractures of the superior most sternal wire. Osseous structures are intact and unremarkable. IMPRESSION: No active cardiopulmonary disease. Electronically Signed   By: Jacqulynn Cadet M.D.   On: 06/17/2017 13:30   Dg Humerus Right  Result Date: 06/17/2017 CLINICAL DATA:  Mid to distal right humerus pain. EXAM: RIGHT HUMERUS - 2+ VIEW COMPARISON:  None. FINDINGS: There is no evidence of fracture or other focal bone lesions. Soft tissues are unremarkable. IV catheter noted in the antecubital fossa of the elbow. IMPRESSION: No acute fracture or malalignment of the right humerus. Electronically Signed   By: Ashley Royalty M.D.   On: 06/17/2017 17:45    Mercy Riding, MD 06/17/2017, 6:35 PM PGY-3, [place] Intern pager: [phone], text pages welcome

## 2017-06-17 NOTE — Progress Notes (Signed)
VASCULAR LAB PRELIMINARY  PRELIMINARY  PRELIMINARY  PRELIMINARY  Right upper extremity venous duplex completed.    Preliminary report:  There is no DVT or SVT noted in the right upper extremity.  Mikhail Hallenbeck, RVT 06/17/2017, 6:41 PM

## 2017-06-17 NOTE — ED Provider Notes (Signed)
[facility] DEPT Provider Note   CSN: [id number] Arrival date & time: 06/17/17  1203     History   Chief Complaint Chief Complaint  Patient presents with  . Arm Pain  . Numbness    HPI  Tamara Henry is a 56 y.o. Female with a history of CAD s/p CABG, PE, HTN, DM,  CKD, who presents after she woke up with sharp pain in her right arm this morning. She reports sharp pain in her right upper arm in the bicep area, reports it doesn't feel like muscle spasm or muscle pain, this woke her from sleep. Patient reports she moved her arm around a bit and the pain went away. She reports 2 similar short episodes of the pain while she was sitting at the breakfast table this morning, but also went away with movement. No redness or swelling in the arm. Patient denies chest pain or shortness of breath, pain does not radiate anywhere. Patient reports with her MI last year she had similar pain in the left arm and this is her biggest concern. No pain in the left arm today, patient has numbness in the left arm at baseline due to cervical radiculopathy versus post shoulder surgery changes, this is unchanged today.       Past Medical History:  Diagnosis Date  . Acid reflux   . Anxiety   . Arthritis    back  . Asthma   . Back pain   . Bilateral pulmonary embolism ([place]) 05/2016  . Bipolar disorder ([place])   . CAD in native artery    S/P NSTEMI with cath showing severe 3 vessel ASCAD s/p CABGx4 07/28/16.  . Cataracts, bilateral   . CKD (chronic kidney disease), stage III (HCC)    borderline CKD II-III  . Cocaine abuse ([place])    In remission. Stopped using in early 2000's  . Depression   . Diabetes mellitus ([place])    Type II  . GERD (gastroesophageal reflux disease)   . Glaucoma   . High cholesterol   . History of blood transfusion   . 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 Hx of chest tube placement right   . Hypertension   . Morbid obesity ([place])   . Myocardial infarction ([place])   . Neuropathy    hands and  feet  . Pneumonia   . Postoperative anemia 07/2016  . PTSD (post-traumatic stress disorder)   . Stroke [facility]) 2005   no residual    Patient Active Problem List   Diagnosis Date Noted  . OSA on CPAP 05/03/2017  . Morbid obesity due to excess calories ([place]) 05/03/2017  . Vaginal discharge 03/27/2017  . Microscopic hematuria 03/27/2017  . Impingement syndrome of left shoulder 02/19/2017  . Hypersomnia 12/12/2016  . REM behavioral disorder 12/12/2016  . Asthma 12/12/2016  . Flatulence 11/17/2016  . Neck pain 11/17/2016  . CKD (chronic kidney disease), stage III ([place]) 11/02/2016  . At risk for sleep apnea 11/01/2016  . CAD (coronary artery disease), native coronary artery 10/26/2016  . Encounter for post surgical wound check 10/23/2016  . Poor social situation 10/13/2016  . Anemia 08/30/2016  . Leg pain 08/30/2016  . Bilateral low back pain without sciatica 08/30/2016  . 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 Bilateral pulmonary embolism ([place]) 05/17/2016  . Hyperlipidemia 05/17/2016  . Depression 05/17/2016  . Diabetic peripheral neuropathy ([place]) 05/17/2016  . Neuritis of upper extremity, C7 01/15/2015  . DM type 2, uncontrolled, with renal complications ([place]) 59/93/5701  .  Hypertension 01/14/2015  . Numbness and tingling of right arm 01/14/2015    Past Surgical History:  Procedure Laterality Date  . CARDIAC CATHETERIZATION N/A 07/25/2016   Procedure: Left Heart Cath and Coronary Angiography;  Surgeon: Peter M Martinique, MD;  Location: [facility] CV LAB;  Service: Cardiovascular;  Laterality: N/A;  . COLONOSCOPY W/ POLYPECTOMY    . CORONARY ARTERY BYPASS GRAFT N/A 07/28/2016   Procedure: CORONARY ARTERY BYPASS GRAFTING (CABG)x4 using left internal mammary and endoscopic harvest of right greater saphenous vein;  Surgeon: Ivin Poot, MD;  Location: [facility];  Service: Open Heart Surgery;  Laterality: N/A;  . DENTAL SURGERY     to removed broken teeth  . SHOULDER ARTHROSCOPY WITH ROTATOR  CUFF REPAIR Left 02/19/2017   Procedure: LEFT SHOULDER ARTHROSCOPY with debridement andROTATOR CUFF REPAIR;  Surgeon: [name], MD;  Location: [facility];  Service: Orthopedics;  Laterality: Left;  . TEE WITHOUT CARDIOVERSION N/A 07/28/2016   Procedure: TRANSESOPHAGEAL ECHOCARDIOGRAM (TEE);  Surgeon: Ivin Poot, MD;  Location: [facility];  Service: Open Heart Surgery;  Laterality: N/A;  . TUBAL LIGATION      OB History    Gravida Para Term Preterm AB Living   5 4 4   1 4    SAB TAB Ectopic Multiple Live Births     1             Home Medications    Prior to Admission medications   Medication Sig Start Date End Date Taking? Authorizing Provider  amLODipine (NORVASC) 10 MG tablet Take 1 tablet (10 mg total) by mouth daily. 06/04/17   Carlyle Dolly, MD  aspirin 81 MG EC tablet Take 1 tablet (81 mg total) by mouth daily. 10/26/16   Sueanne Margarita, MD  atorvastatin (LIPITOR) 80 MG tablet Take 1 tablet (80 mg total) by mouth every evening. 01/18/17 04/18/17  Carlyle Dolly, MD  atorvastatin (LIPITOR) 80 MG tablet Take 80 mg by mouth every evening.    [provider]  citalopram (CELEXA) 20 MG tablet Take 1 tablet (20 mg total) by mouth daily. 06/04/17   Carlyle Dolly, MD  ezetimibe (ZETIA) 10 MG tablet Take 1 tablet (10 mg total) by mouth daily. 05/30/17   Carlyle Dolly, MD  ferrous sulfate 325 (65 FE) MG tablet Take 1 tablet (325 mg total) by mouth daily with breakfast. 01/18/17   Carlyle Dolly, MD  folic acid (FOLVITE) 382 MCG tablet Take 400 mcg by mouth daily.    [provider]  furosemide (LASIX) 40 MG tablet Take 1 tablet (40 mg total) by mouth daily. 05/30/17   Carlyle Dolly, MD  gabapentin (NEURONTIN) 300 MG capsule Take 900 mg by mouth in the morning, 600 mg at lunch, and 900 mg at bedtime 05/30/17   Carlyle Dolly, MD  HYDROcodone-acetaminophen (NORCO/VICODIN) 5-325 MG tablet Take 1 tablet by mouth 3 (three) times daily as needed  for moderate pain. 04/16/17   Suzan Slick, NP  insulin aspart (NOVOLOG) 100 UNIT/ML injection Inject 5 Units into the skin 2 (two) times daily before a meal. 02/09/17   Carlyle Dolly, MD  Insulin Glargine (BASAGLAR KWIKPEN) 100 UNIT/ML SOPN INJECT 50 UNITS INTO SKIN EACH NIGHT AT BEDTIME 04/30/17   Carlyle Dolly, MD  loperamide (IMODIUM) 2 MG capsule Take 1 capsule (2 mg total) by mouth daily as needed for diarrhea or loose stools. 05/30/17   Carlyle Dolly, MD  Melatonin 3  MG TABS Take 6 mg by mouth at bedtime.    [provider]  metoprolol tartrate (LOPRESSOR) 25 MG tablet Take 1 tablet (25 mg total) by mouth 2 (two) times daily. 06/04/17   Carlyle Dolly, MD  Multiple Vitamins-Minerals (MULTIVITAMIN) tablet Take 1 tablet by mouth daily. 01/18/17   Carlyle Dolly, MD  omeprazole (PRILOSEC) 40 MG capsule Take 1 capsule (40 mg total) by mouth daily. 05/30/17   Carlyle Dolly, MD  potassium chloride SA (K-DUR,KLOR-CON) 20 MEQ tablet take half a TABLET BY MOUTH EVERY MORNING 05/30/17   Carlyle Dolly, MD  Probiotic Product (ALIGN) 4 MG CAPS Take 1 capsule (4 mg total) by mouth daily. 01/18/17   Carlyle Dolly, MD  rivaroxaban (XARELTO) 10 MG TABS tablet Take 1 tablet (10 mg total) by mouth every morning. 04/11/17   Carlyle Dolly, MD  tiZANidine (ZANAFLEX) 4 MG tablet TAKE 1 TABLET BY MOUTH EVERY 8 HOURS AS NEEDED FOR MUSCLE SPASMS 03/12/17   Carlyle Dolly, MD    Family History Family History  Problem Relation Age of Onset  . Pancreatitis Mother   . Diabetes type II Mother   . CAD Father   . Diabetes type II Father   . Diabetes type II Sister   . Diabetes type II Brother     Social History Social History  Substance Use Topics  . Smoking status: Former Smoker    Quit date: 09/11/2005  . Smokeless tobacco: Never Used  . Alcohol use No     Allergies   Lactose intolerance (gi); Lisinopril; Reglan [metoclopramide]; Wellbutrin  [bupropion]; Latex; Metformin and related; Penicillins; Sulfa antibiotics; and Tape   Review of Systems Review of Systems  Constitutional: Negative for chills and fever.  HENT: Negative for congestion, rhinorrhea and sore throat.   Eyes: Negative for visual disturbance.  Respiratory: Negative for cough, chest tightness and shortness of breath.   Cardiovascular: Negative for chest pain, palpitations and leg swelling.  Gastrointestinal: Negative for abdominal pain, nausea and vomiting.  Genitourinary: Negative for dysuria.  Musculoskeletal: Positive for myalgias.       R arm pain  Skin: Negative for color change and rash.  Neurological: Positive for numbness. Negative for dizziness, tremors, facial asymmetry, speech difficulty and weakness.     Physical Exam Updated Vital Signs BP 140/83 (BP Location: Left Arm)   Pulse 78   Temp 97.9 F (36.6 C) (Oral)   Resp 17   SpO2 97%   Physical Exam  Constitutional: She appears well-developed and well-nourished. No distress.  HENT:  Head: Normocephalic and atraumatic.  Eyes: Pupils are equal, round, and reactive to light. EOM are normal. Right eye exhibits no discharge. Left eye exhibits no discharge.  Cardiovascular: Normal rate, regular rhythm, normal heart sounds and intact distal pulses.   Pulmonary/Chest: Effort normal. No respiratory distress. She has no wheezes. She has no rales.  Breath sounds slightly decreased throughout  Abdominal: Soft. Bowel sounds are normal. She exhibits no distension. There is no tenderness. There is no guarding.  Musculoskeletal: She exhibits no edema or deformity.  Right upper arm nontender to palpation, no palpable muscle tension, no swelling, no erythema. Distal pulses 2+, good capillary refill, sensation intact, Strength 5/5 with extension and flexion at the elbow and shoulder, goo grip strength, no recurrence of symptoms during exam  Neurological: She is alert. Coordination normal.  Speech is clear,  able to follow commands CN III-XII intact Normal strength in upper and lower extremities  bilaterally including dorsiflexion and plantar flexion, strong and equal grip strength Sensation normal to light and sharp touch Moves extremities without ataxia, coordination intact  Skin: Skin is warm and dry. Capillary refill takes less than 2 seconds. She is not diaphoretic.  Psychiatric: She has a normal mood and affect. Her behavior is normal.  Nursing note and vitals reviewed.    ED Treatments / Results  Labs (all labs ordered are listed, but only abnormal results are displayed) Labs Reviewed  BASIC METABOLIC PANEL - Abnormal; Notable for the following:       Result Value   Creatinine, Ser 1.37 (*)    Calcium 8.8 (*)    GFR calc non Af Amer 43 (*)    GFR calc Af Amer 50 (*)    All other components within normal limits  CBC - Abnormal; Notable for the following:    RDW 15.6 (*)    All other components within normal limits  PROTIME-INR - Abnormal; Notable for the following:    Prothrombin Time 16.1 (*)    All other components within normal limits  I-STAT TROPONIN, ED    EKG  EKG Interpretation  Date/Time:  Sunday June 17 2017 12:17:23 EDT Ventricular Rate:  73 PR Interval:  148 QRS Duration: 82 QT Interval:  420 QTC Calculation: 462 R Axis:   88 Text Interpretation:  Normal sinus rhythm Normal ECG No significant change since last tracing Confirmed by Duffy Bruce [phone]) on 06/17/2017 1:05:53 PM       Radiology Mr Lumbar Spine W/o Contrast  Result Date: 06/15/2017 CLINICAL DATA:  Chronic right-sided low back pain extending to the right leg. Right leg weakness. EXAM: MRI LUMBAR SPINE WITHOUT CONTRAST TECHNIQUE: Multiplanar, multisequence MR imaging of the lumbar spine was performed. No intravenous contrast was administered. COMPARISON:  Radiography 06/06/2017.  MRI 01/13/2015. FINDINGS: Segmentation:  5 lumbar type vertebral bodies. Alignment:  Normal Vertebrae:  Normal  Conus medullaris: Extends to the L1 level and appears normal. Paraspinal and other soft tissues: Negative Disc levels: No abnormality at T12-L1 or L1-2. L2-3: No disc abnormality. Bilateral facet and ligamentous hypertrophy. No compressive stenosis. L3-4: Disc degeneration with bilateral foraminal to extraforaminal protrusions. Facet and ligamentous hypertrophy. Mild central canal stenosis. Foraminal encroachment that would have some potential to affect either or both L3 nerves. This is more pronounced on the left. L4-5: Disc degeneration with bilateral foraminal to extraforaminal protrusions. Facet and ligamentous hypertrophy. Mild stenosis of the central canal and lateral recesses. Foraminal to extraforaminal encroachment would have some potential to affect either or both L4 nerves. L5-S1: Broad-based disc herniation more prominent towards the left. Facet and ligamentous hypertrophy. Narrowing of the subarticular lateral recesses left more than right. Left S1 nerve root compression could occur. Findings appear similar to the study of May 2016. IMPRESSION: L2-3:  Facet and ligamentous hypertrophy.  No compressive stenosis. L3-4: Bilateral foraminal to extraforaminal protrusions more prominent on the left. Facet and ligamentous hypertrophy. Multifactorial stenosis. Potential for compression of the exiting L3 nerves, particularly on the left. L4-5: Bilateral foraminal to extraforaminal protrusions. Facet and ligamentous hypertrophy. Mild canal and lateral recess stenosis. Potential for compression of the exiting L4 nerves. L5-S1: Left posterolateral prominent disc herniation. Subarticular lateral recess on the left that could compress the left S1 nerve. Electronically Signed   By: Nelson Chimes M.D.   On: 06/15/2017 14:21    Procedures Procedures (including critical care time)  Medications Ordered in ED Medications  aspirin chewable tablet 324 mg (324 mg  Oral Given 06/17/17 1532)     Initial Impression /  Assessment and Plan / ED Course  I have reviewed the triage vital signs and the nursing notes.  Pertinent labs & imaging results that were available during my care of the patient were reviewed by me and considered in my medical decision making (see chart for details).  Pt presents with sharp right arm pain this morning, patient is concerned that she had similar pain on the left side with her previous MI. Vitals normal on initial evaluation. Exam unremarkable, tenderness in the right arm is not reproducible on exam. Troponin is negative, EKG is unremarkable, and chest x-ray shows no evidence of acute cardiopulmonary disease, labs unremarkable thus far, kidney function at baseline. Workup is reassuring thus far but given patient's high risk with history of extensive CAD, hypertension, diabetes, and HLD, admission for observation and chest pain rule out is reasonable and pt is agreeable to this.  Patient discussed with Dr. Juliet Rude, who saw patient as well and agrees with plan. Hospitalists consulted for admission, Dr. Juliet Rude spoke with Dr. Cyndia Skeeters who will see and admit the patient  Final Clinical Impressions(s) / ED Diagnoses   Final diagnoses:  Arm pain, anterior, right    New Prescriptions New Prescriptions   No medications on file     Janet Berlin 06/17/17 2014    Duffy Bruce, MD 06/18/17 2117

## 2017-06-17 NOTE — Progress Notes (Addendum)
ANTICOAGULATION CONSULT NOTE - Initial Consult  Pharmacy Consult for heparin (PTA Xarelto on hold) Indication: chest pain/ACS (hx PE/DVT)  Allergies  Allergen Reactions  . Lactose Intolerance (Gi) Diarrhea  . Lisinopril Other (See Comments) and Cough    Inflammation, coughing  . Reglan [Metoclopramide] Other (See Comments)    REACTION IS SIDE EFFECT Pt stated having lock jaw as a side effect  . Wellbutrin [Bupropion] Nausea And Vomiting and Cough  . Latex Rash  . Metformin And Related Diarrhea  . Penicillins Rash    [FROM PREVIOUS ENTRY-BEFORE 07/27/16] >>"ALL [place]"  PATIENT HAD A PCN REACTION WITH IMMEDIATE RASH, FACIAL/TONGUE/THROAT SWELLING, SOB, OR LIGHTHEADEDNESS WITH HYPOTENSION:  #  #  #  YES  #  #  #   Has patient had a PCN reaction causing severe rash involving mucus membranes or skin necrosis: No Has patient had a PCN reaction that required hospitalization No Has patient had a PCN reaction occurring within the last 10 years:   #  #  #  YES  #  #  #   . Sulfa Antibiotics Itching  . Tape Rash    Patient Measurements: Height: 5\' 4"  (162.6 cm) Weight: 240 lb 12.8 oz (109.2 kg) IBW/kg (Calculated) : 54.7 Heparin Dosing Weight: 80.6 kg   Vital Signs: Temp: 99.3 F (37.4 C) (10/07 2238) Temp Source: Oral (10/07 2238) BP: 153/77 (10/07 2238) Pulse Rate: 69 (10/07 2238)  Labs:  Recent Labs  06/17/17 1222  HGB 12.5  HCT 41.2  PLT 267  LABPROT 16.1*  INR 1.30  CREATININE 1.37*    Estimated Creatinine Clearance: 57.4 mL/min (A) (by C-G formula based on SCr of 1.37 mg/dL (H)).   Medical History: Past Medical History:  Diagnosis Date  . Acid reflux   . Anxiety   . Arthritis    back  . Asthma   . Back pain   . Bilateral pulmonary embolism ([place]) 05/2016  . Bipolar disorder ([place])   . CAD in native artery    S/P NSTEMI with cath showing severe 3 vessel ASCAD s/p CABGx4 07/28/16.  . Cataracts, bilateral   . CKD (chronic kidney disease), stage III (HCC)     borderline CKD II-III  . Cocaine abuse ([place])    In remission. Stopped using in early 2000's  . Depression   . Diabetes mellitus ([place])    Type II  . GERD (gastroesophageal reflux disease)   . Glaucoma   . High cholesterol   . History of blood transfusion   . 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 Hx of chest tube placement right   . Hypertension   . Morbid obesity ([place])   . Myocardial infarction ([place])   . Neuropathy    hands and feet  . Pneumonia   . Postoperative anemia 07/2016  . PTSD (post-traumatic stress disorder)   . Stroke [facility]) 2005   no residual    Assessment: 56 yo female admitted with right arm pain. History of CAD with CABG. Pharmacy consulted to dose heparin for r/o ACS. Troponins ordered.   On Xarelto PTA for history of DVT/PE (10mg  daily), last dose 10/7 at 0930. CBC within normal limits and no s/s bleeding documented.   Will order baseline heparin level and aPTT. Expect Xarelto will falsely elevated heparin level, so will use aPTT to dose until correlating.   Goal of Therapy:  Heparin level 0.3-0.7 units/ml aPTT 66-102 seconds Monitor platelets by anticoagulation protocol: Yes   Plan:  Baseline heparin level and  aPTT Start heparin gtt at 1150 units/hr (no bolus) aPTT in 6 hrs Daily heparin level, aPTT, and CBC Monitor for s/s bleeding  F/u cardiology plan   ADDENDUM:  AM aPTT therapeutic at 69s.  Confirm aPTT in 6 hrs Daily heparin level, aPTT, and CBC Monitor for bleeding and f/u cards plan   Argie Ramming, PharmD Clinical Pharmacist 06/17/17 11:04 PM

## 2017-06-18 DIAGNOSIS — I2510 Atherosclerotic heart disease of native coronary artery without angina pectoris: Secondary | ICD-10-CM | POA: Diagnosis not present

## 2017-06-18 DIAGNOSIS — I129 Hypertensive chronic kidney disease with stage 1 through stage 4 chronic kidney disease, or unspecified chronic kidney disease: Secondary | ICD-10-CM | POA: Diagnosis not present

## 2017-06-18 DIAGNOSIS — R197 Diarrhea, unspecified: Secondary | ICD-10-CM | POA: Diagnosis not present

## 2017-06-18 DIAGNOSIS — N183 Chronic kidney disease, stage 3 (moderate): Secondary | ICD-10-CM | POA: Diagnosis not present

## 2017-06-18 DIAGNOSIS — G4733 Obstructive sleep apnea (adult) (pediatric): Secondary | ICD-10-CM | POA: Diagnosis not present

## 2017-06-18 DIAGNOSIS — M1990 Unspecified osteoarthritis, unspecified site: Secondary | ICD-10-CM | POA: Diagnosis not present

## 2017-06-18 DIAGNOSIS — E1122 Type 2 diabetes mellitus with diabetic chronic kidney disease: Secondary | ICD-10-CM | POA: Diagnosis not present

## 2017-06-18 DIAGNOSIS — M79601 Pain in right arm: Secondary | ICD-10-CM | POA: Diagnosis not present

## 2017-06-18 DIAGNOSIS — Z794 Long term (current) use of insulin: Secondary | ICD-10-CM | POA: Diagnosis not present

## 2017-06-18 DIAGNOSIS — E7800 Pure hypercholesterolemia, unspecified: Secondary | ICD-10-CM | POA: Diagnosis not present

## 2017-06-18 DIAGNOSIS — I252 Old myocardial infarction: Secondary | ICD-10-CM | POA: Diagnosis not present

## 2017-06-18 DIAGNOSIS — M79621 Pain in right upper arm: Secondary | ICD-10-CM | POA: Diagnosis not present

## 2017-06-18 DIAGNOSIS — E1142 Type 2 diabetes mellitus with diabetic polyneuropathy: Secondary | ICD-10-CM | POA: Diagnosis not present

## 2017-06-18 LAB — CBC
HCT: 36.4 % (ref 36.0–46.0)
Hemoglobin: 10.9 g/dL — ABNORMAL LOW (ref 12.0–15.0)
MCH: 25.7 pg — ABNORMAL LOW (ref 26.0–34.0)
MCHC: 29.9 g/dL — AB (ref 30.0–36.0)
MCV: 85.8 fL (ref 78.0–100.0)
Platelets: 233 10*3/uL (ref 150–400)
RBC: 4.24 MIL/uL (ref 3.87–5.11)
RDW: 15.6 % — ABNORMAL HIGH (ref 11.5–15.5)
WBC: 9.1 10*3/uL (ref 4.0–10.5)

## 2017-06-18 LAB — TROPONIN I
Troponin I: <0.03 ng/mL (ref <0.03)
Troponin I: <0.03 ng/mL (ref <0.03)

## 2017-06-18 LAB — APTT
APTT: 37 s — AB (ref 24–36)
aPTT: 69 seconds — ABNORMAL HIGH (ref 24–36)

## 2017-06-18 LAB — GLUCOSE, CAPILLARY
GLUCOSE-CAPILLARY: 115 mg/dL — AB (ref 65–99)
Glucose-Capillary: 266 mg/dL — ABNORMAL HIGH (ref 65–99)

## 2017-06-18 LAB — HEPARIN LEVEL (UNFRACTIONATED)
HEPARIN UNFRACTIONATED: 1.62 [IU]/mL — AB (ref 0.30–0.70)
Heparin Unfractionated: 1.3 IU/mL — ABNORMAL HIGH (ref 0.30–0.70)

## 2017-06-18 LAB — HIV ANTIBODY (ROUTINE TESTING W REFLEX): HIV SCREEN 4TH GENERATION: NONREACTIVE

## 2017-06-18 MED ORDER — RIVAROXABAN 10 MG PO TABS
10.0000 mg | ORAL_TABLET | Freq: Every morning | ORAL | Status: DC
  Administered 2017-06-18: 10 mg via ORAL
  Filled 2017-06-18: qty 1

## 2017-06-18 MED ORDER — CALCIUM CARBONATE ANTACID 500 MG PO CHEW
1.0000 | CHEWABLE_TABLET | Freq: Two times a day (BID) | ORAL | Status: DC | PRN
Start: 2017-06-18 — End: 2017-06-18
  Administered 2017-06-18: 200 mg via ORAL
  Filled 2017-06-18: qty 1

## 2017-06-18 NOTE — Discharge Instructions (Signed)
You were admitted for right arm pain. It was likely musculoskeletal in origin.   Please follow up on 10/10 @ 3:15pm (please arrive at least 15 min early) at the [facility] clinic for follow up.   If you develop increased pain, chest pain, shortness of breath, or decreased range of motion please call your PCP or come to the emergency room.

## 2017-06-18 NOTE — Progress Notes (Signed)
Family Medicine Teaching Service Daily Progress Note Intern Pager: [phone]  Patient name: Tamara Henry Medical record number: [id number] Date of birth: [birth year] Age: 56 y.o. Gender: female  Primary Care Provider: Carlyle Dolly, MD Consultants: None Code Status: Full   Pt Overview and Major Events to Date:  Admitted to [facility] on 06/17/2017  Assessment and Plan: Tamara Henry is a 56 y.o. female presenting with right arm pain . PMH is significant for CAD s/p CABG, PE, HTN, DM, CKD  Right arm pain-resolved Likely musculoskeletal in origin. She was tender to palpation over the body of biceps muscle on admission, but denies tenderness on exam today. Negative Yergason's and resisted supination on exam. No findings suggestive for cellulitis or upper arm DVT. She is on Xarelto 10 mg daily. However, she reports history of PICC line in the right arm. DG of humerus shows no acute fracture of malalignment of right humerus. Preliminary doppler of right extremity showing no DVT or SVT in right upper extremity.  -follow up DVT Doppler right extremity -Rule out ACS as below. -consider outpatient sports medicine follow up   History of CAD s/p CABG 4.  She is anxious that her right arm is due to heart attack and couldn't be reassured. She has no cardiopulmonary symptoms. Troponin negative x 3. EKG showing QT prolongation but no ST changes.  -Continue metoprolol, atorvastatin and aspirin.  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 Has been on Xarelto 10 mg. Reports good compliance with his medication. -begin Xarelto -discontinue heparin gtt   Diarrhea-resolved Reports 3 episodes of watery diarrhea since arrival in ED. No fever or leukocytosis suggestive for infectious etiology. No recent antibiotic or laxative use. She is on Zetia which could be contributing. She uses Imodium at home. Patient denies further diarrhea  -Hold Zetia -Continue home Imodium  OSA On nightly  CPAP at home -Continue nightly CPAP  Hypertension Normotensive with most recent BP of 130/64. Home medications of amlodipine and metoprolol.  -Continue home medications   Diabetes Last A1c 10.1 on 05/17/2017. BMP blood glucose 75. She is on Lantus 50 units nightly and NovoLog 5 units twice a day with meals. -Lantus 30 units nightly -SSI-thin with at bedtime coverage -CBG before meals and at bedtime  CKD-3A Stable. Creatinine 1.37 on admission. This line 1.3-1.5. -monitor BMP  Diabetic peripheral neuropathy -Continue home gabapentin  History of depression/insomnia Stable. -Continue home citalopram and nightly melatonin  FEN/GI: Carb modified and heart healthy diet PPx: Heparin gtt. We will transition back to Xarelto once workup is negative  Disposition: discharge to home   Subjective:  Patient today states she is doing better. Patient denies chest pain, SOB, upper extremity pain, or diarrhea. States she is back to baseline. Patient states she sees an orthopedic surgeon for LUE pain.   Objective: Temp:  [97.8 F (36.6 C)-99.3 F (37.4 C)] 98.5 F (36.9 C) (10/08 1149) Pulse Rate:  [64-88] 67 (10/08 1149) Resp:  [13-24] 18 (10/08 1149) BP: (104-153)/(62-81) 142/68 (10/08 1149) SpO2:  [89 %-100 %] 100 % (10/08 1149) Weight:  [239 lb 12.8 oz (108.8 kg)-240 lb 12.8 oz (109.2 kg)] 239 lb 12.8 oz (108.8 kg) (10/08 0618) Physical Exam: General: awake and alert, laying in bed, NAD  Cardiovascular: RRR, no MRG Respiratory: CTAB, no wheezes, rales or rhonchi  Abdomen: soft, non tender, non distended, bowel sounds x 4 quadrants  Extremities: no edema, RUE non tender to palpation, full ROM in RUE, limited range of motion  in LUE  Laboratory:  Recent Labs Lab 06/17/17 1222 06/18/17 0504  WBC 8.0 9.1  HGB 12.5 10.9*  HCT 41.2 36.4  PLT 267 233    Recent Labs Lab 06/17/17 1222  NA 141  K 4.1  CL 109  CO2 23  BUN 18  CREATININE 1.37*  CALCIUM 8.8*  GLUCOSE 75    Imaging/Diagnostic Tests: Dg Chest 2 View  Result Date: 06/17/2017 CLINICAL DATA:  55 year old female with right arm pain EXAM: CHEST  2 VIEW COMPARISON:  Prior chest x-ray 10/21/2016 FINDINGS: Cardiac and mediastinal contours are within normal limits. Patient is status post median sternotomy with evidence of prior multivessel CABG including LIMA bypass. The lungs are clear. Stable fractures of the superior most sternal wire. Osseous structures are intact and unremarkable. IMPRESSION: No active cardiopulmonary disease. Electronically Signed   By: Jacqulynn Cadet M.D.   On: 06/17/2017 13:30   Mr Lumbar Spine W/o Contrast  Result Date: 06/15/2017 CLINICAL DATA:  Chronic right-sided low back pain extending to the right leg. Right leg weakness. EXAM: MRI LUMBAR SPINE WITHOUT CONTRAST TECHNIQUE: Multiplanar, multisequence MR imaging of the lumbar spine was performed. No intravenous contrast was administered. COMPARISON:  Radiography 06/06/2017.  MRI 01/13/2015. FINDINGS: Segmentation:  5 lumbar type vertebral bodies. Alignment:  Normal Vertebrae:  Normal Conus medullaris: Extends to the L1 level and appears normal. Paraspinal and other soft tissues: Negative Disc levels: No abnormality at T12-L1 or L1-2. L2-3: No disc abnormality. Bilateral facet and ligamentous hypertrophy. No compressive stenosis. L3-4: Disc degeneration with bilateral foraminal to extraforaminal protrusions. Facet and ligamentous hypertrophy. Mild central canal stenosis. Foraminal encroachment that would have some potential to affect either or both L3 nerves. This is more pronounced on the left. L4-5: Disc degeneration with bilateral foraminal to extraforaminal protrusions. Facet and ligamentous hypertrophy. Mild stenosis of the central canal and lateral recesses. Foraminal to extraforaminal encroachment would have some potential to affect either or both L4 nerves. L5-S1: Broad-based disc herniation more prominent towards the left. Facet  and ligamentous hypertrophy. Narrowing of the subarticular lateral recesses left more than right. Left S1 nerve root compression could occur. Findings appear similar to the study of May 2016. IMPRESSION: L2-3:  Facet and ligamentous hypertrophy.  No compressive stenosis. L3-4: Bilateral foraminal to extraforaminal protrusions more prominent on the left. Facet and ligamentous hypertrophy. Multifactorial stenosis. Potential for compression of the exiting L3 nerves, particularly on the left. L4-5: Bilateral foraminal to extraforaminal protrusions. Facet and ligamentous hypertrophy. Mild canal and lateral recess stenosis. Potential for compression of the exiting L4 nerves. L5-S1: Left posterolateral prominent disc herniation. Subarticular lateral recess on the left that could compress the left S1 nerve. Electronically Signed   By: Nelson Chimes M.D.   On: 06/15/2017 14:21   Dg Humerus Right  Result Date: 06/17/2017 CLINICAL DATA:  Mid to distal right humerus pain. EXAM: RIGHT HUMERUS - 2+ VIEW COMPARISON:  None. FINDINGS: There is no evidence of fracture or other focal bone lesions. Soft tissues are unremarkable. IV catheter noted in the antecubital fossa of the elbow. IMPRESSION: No acute fracture or malalignment of the right humerus. Electronically Signed   By: Ashley Royalty M.D.   On: 06/17/2017 17:45   Xr Lumbar Spine 2-3 Views  Result Date: 06/06/2017 X-rays lumbar spine show multilevel degenerative disc disease. No acute findings.    Caroline More, DO 06/18/2017, 12:29 PM PGY-1, [facility] Intern pager: [phone], text pages welcome

## 2017-06-18 NOTE — Discharge Summary (Signed)
[facility] Discharge Summary  Patient name: Tamara Henry Medical record number: [id number] Date of birth: 04-03-1961 Age: 56 y.o. Gender: female Date of Admission: 06/17/2017  Date of Discharge: 06/18/2017 Admitting Physician: Dickie La, MD  Primary Care Provider: Carlyle Dolly, MD Consultants: None  Indication for Hospitalization: Right arm pain, ACS rule out  Discharge Diagnoses/Problem List:  Right arm pain History of CAD s/p CABG x 4 History of DVT/PE Diarrhea OSA HTN Diabetes CKD-3A Diabetic peripheral neuropathy  History of depression/insomnia  Disposition: to home   Discharge Condition: stable, improved  Discharge Exam:  General: awake and alert, laying in bed, NAD  Cardiovascular: RRR, no MRG Respiratory: CTAB, no wheezes, rales or rhonchi  Abdomen: soft, non tender, non distended, bowel sounds x 4 quadrants  Extremities: no edema, RUE non tender to palpation, full ROM in RUE, limited range of motion in LUE  Brief Hospital Course:  Tamara Henry is a 56 y.o. female presenting for right arm pain x7 days. PMH is significant for CAD s/p CABG, PE, HTN, DM, CKD. Patient was originally worked up for ACS rule out given MI last year with history of atypical symptoms. Patient has a history of DVT and PE and has been on 10 mg Xarelto for 1 year. Patient has history of PICC line placement in right arm 10 months ago. DG of humerus showed no acute fracture or malalignment of right humerus. Doppler of right extremity showing no DVT or SVT in right upper extremity. On discharge pain was resolved and patient had full range of motion. ACS was ruled out given negative troponin x 3 and negative EKG. While inpatient patient was placed on heparin gtt and transitioned to home Xarelto dose for discharge. Zetia was discontinued to to patient's history of diarrhea while on medication.   Issues for Follow Up:  1. Outpatient follow up with sports medicine   2. Discontinued Zetia   Significant Procedures: none   Significant Labs and Imaging:   Recent Labs Lab 06/17/17 1222 06/18/17 0504  WBC 8.0 9.1  HGB 12.5 10.9*  HCT 41.2 36.4  PLT 267 233    Recent Labs Lab 06/17/17 1222  NA 141  K 4.1  CL 109  CO2 23  GLUCOSE 75  BUN 18  CREATININE 1.37*  CALCIUM 8.8*   Dg Chest 2 View  Result Date: 06/17/2017 CLINICAL DATA:  56 year old female with right arm pain EXAM: CHEST  2 VIEW COMPARISON:  Prior chest x-ray 10/21/2016 FINDINGS: Cardiac and mediastinal contours are within normal limits. Patient is status post median sternotomy with evidence of prior multivessel CABG including LIMA bypass. The lungs are clear. Stable fractures of the superior most sternal wire. Osseous structures are intact and unremarkable. IMPRESSION: No active cardiopulmonary disease. Electronically Signed   By: Jacqulynn Cadet M.D.   On: 06/17/2017 13:30   Mr Lumbar Spine W/o Contrast  Result Date: 06/15/2017 CLINICAL DATA:  Chronic right-sided low back pain extending to the right leg. Right leg weakness. EXAM: MRI LUMBAR SPINE WITHOUT CONTRAST TECHNIQUE: Multiplanar, multisequence MR imaging of the lumbar spine was performed. No intravenous contrast was administered. COMPARISON:  Radiography 06/06/2017.  MRI 01/13/2015. FINDINGS: Segmentation:  5 lumbar type vertebral bodies. Alignment:  Normal Vertebrae:  Normal Conus medullaris: Extends to the L1 level and appears normal. Paraspinal and other soft tissues: Negative Disc levels: No abnormality at T12-L1 or L1-2. L2-3: No disc abnormality. Bilateral facet and ligamentous hypertrophy. No compressive stenosis. L3-4: Disc degeneration with bilateral  foraminal to extraforaminal protrusions. Facet and ligamentous hypertrophy. Mild central canal stenosis. Foraminal encroachment that would have some potential to affect either or both L3 nerves. This is more pronounced on the left. L4-5: Disc degeneration with bilateral  foraminal to extraforaminal protrusions. Facet and ligamentous hypertrophy. Mild stenosis of the central canal and lateral recesses. Foraminal to extraforaminal encroachment would have some potential to affect either or both L4 nerves. L5-S1: Broad-based disc herniation more prominent towards the left. Facet and ligamentous hypertrophy. Narrowing of the subarticular lateral recesses left more than right. Left S1 nerve root compression could occur. Findings appear similar to the study of May 2016. IMPRESSION: L2-3:  Facet and ligamentous hypertrophy.  No compressive stenosis. L3-4: Bilateral foraminal to extraforaminal protrusions more prominent on the left. Facet and ligamentous hypertrophy. Multifactorial stenosis. Potential for compression of the exiting L3 nerves, particularly on the left. L4-5: Bilateral foraminal to extraforaminal protrusions. Facet and ligamentous hypertrophy. Mild canal and lateral recess stenosis. Potential for compression of the exiting L4 nerves. L5-S1: Left posterolateral prominent disc herniation. Subarticular lateral recess on the left that could compress the left S1 nerve. Electronically Signed   By: Nelson Chimes M.D.   On: 06/15/2017 14:21   Dg Humerus Right  Result Date: 06/17/2017 CLINICAL DATA:  Mid to distal right humerus pain. EXAM: RIGHT HUMERUS - 2+ VIEW COMPARISON:  None. FINDINGS: There is no evidence of fracture or other focal bone lesions. Soft tissues are unremarkable. IV catheter noted in the antecubital fossa of the elbow. IMPRESSION: No acute fracture or malalignment of the right humerus. Electronically Signed   By: Ashley Royalty M.D.   On: 06/17/2017 17:45   Xr Lumbar Spine 2-3 Views  Result Date: 06/06/2017 X-rays lumbar spine show multilevel degenerative disc disease. No acute findings.  Results/Tests Pending at Time of Discharge:  [facility]     Ordered   06/18/17 0500  CBC  Daily,   R     06/17/17 2307     Discharge Medications:   Allergies as of 06/18/2017      Reactions   Lactose Intolerance (gi) Diarrhea   Lisinopril Other (See Comments), Cough   Inflammation, coughing   Reglan [metoclopramide] Other (See Comments)   REACTION IS SIDE EFFECT Pt stated having lock jaw as a side effect   Wellbutrin [bupropion] Nausea And Vomiting, Cough   Latex Rash   Metformin And Related Diarrhea   Penicillins Rash   [FROM PREVIOUS ENTRY-BEFORE 07/27/16] >>"ALL CILLINS" PATIENT HAD A PCN REACTION WITH IMMEDIATE RASH, FACIAL/TONGUE/THROAT SWELLING, SOB, OR LIGHTHEADEDNESS WITH HYPOTENSION:  #  #  #  YES  #  #  #   Has patient had a PCN reaction causing severe rash involving mucus membranes or skin necrosis: No Has patient had a PCN reaction that required hospitalization No Has patient had a PCN reaction occurring within the last 10 years:   #  #  #  YES  #  #  #    Sulfa Antibiotics Itching   Tape Rash      Medication List    STOP taking these medications   ezetimibe 10 MG tablet Commonly known as:  ZETIA     TAKE these medications   ALIGN 4 MG Caps Take 1 capsule (4 mg total) by mouth daily.   amLODipine 10 MG tablet Commonly known as:  NORVASC Take 1 tablet (10 mg total) by mouth daily.   aspirin 81 MG EC tablet Take 1 tablet (  81 mg total) by mouth daily.   atorvastatin 80 MG tablet Commonly known as:  LIPITOR Take 1 tablet (80 mg total) by mouth every evening.   BASAGLAR KWIKPEN 100 UNIT/ML Sopn INJECT 50 UNITS INTO SKIN EACH NIGHT AT BEDTIME   citalopram 20 MG tablet Commonly known as:  CELEXA Take 1 tablet (20 mg total) by mouth daily.   ferrous sulfate 325 (65 FE) MG tablet Take 1 tablet (325 mg total) by mouth daily with breakfast.   folic acid 416 MCG tablet Commonly known as:  FOLVITE Take 400 mcg by mouth daily.   furosemide 40 MG tablet Commonly known as:  LASIX Take 1 tablet (40 mg total) by mouth daily.   gabapentin 300 MG capsule Commonly known as:  NEURONTIN Take 900 mg by mouth  in the morning, 600 mg at lunch, and 900 mg at bedtime   HYDROcodone-acetaminophen 5-325 MG tablet Commonly known as:  NORCO/VICODIN Take 1 tablet by mouth 3 (three) times daily as needed for moderate pain.   insulin aspart 100 UNIT/ML injection Commonly known as:  novoLOG Inject 5 Units into the skin 2 (two) times daily before a meal.   loperamide 2 MG capsule Commonly known as:  IMODIUM Take 1 capsule (2 mg total) by mouth daily as needed for diarrhea or loose stools.   Melatonin 3 MG Tabs Take 6 mg by mouth at bedtime.   metoprolol tartrate 25 MG tablet Commonly known as:  LOPRESSOR Take 1 tablet (25 mg total) by mouth 2 (two) times daily.   multivitamin tablet Take 1 tablet by mouth daily.   omeprazole 40 MG capsule Commonly known as:  PRILOSEC Take 1 capsule (40 mg total) by mouth daily.   potassium chloride SA 20 MEQ tablet Commonly known as:  K-DUR,KLOR-CON take half a TABLET BY MOUTH EVERY MORNING What changed:  See the new instructions.   rivaroxaban 10 MG Tabs tablet Commonly known as:  XARELTO Take 1 tablet (10 mg total) by mouth every morning.   tiZANidine 4 MG tablet Commonly known as:  ZANAFLEX TAKE 1 TABLET BY MOUTH EVERY 8 HOURS AS NEEDED FOR MUSCLE SPASMS What changed:  See the new instructions.       Discharge Instructions: Please refer to Patient Instructions section of EMR for full details.  Patient was counseled important signs and symptoms that should prompt return to medical care, changes in medications, dietary instructions, activity restrictions, and follow up appointments.   Follow-Up Appointments: Follow-up Information    Caroline More, DO. Go on 06/20/2017.   Specialty:  Family Medicine Why:  3:15pm (please arrive at least 15 min early) Contact information: [address] [place] [address] [place], [place], DO 06/18/2017, 3:01 PM PGY-1, [place]

## 2017-06-18 NOTE — Progress Notes (Signed)
Pt has orders to be discharged. Discharge instructions given and pt has no additional questions at this time. Medication regimen reviewed and pt educated. Pt verbalized understanding and has no additional questions. Telemetry box removed. IV removed and site in good condition. Pt stable and waiting for transportation. 

## 2017-06-19 ENCOUNTER — Ambulatory Visit (HOSPITAL_BASED_OUTPATIENT_CLINIC_OR_DEPARTMENT_OTHER): Payer: Medicare Other | Attending: Acute Care | Admitting: Radiology

## 2017-06-19 DIAGNOSIS — Z9989 Dependence on other enabling machines and devices: Principal | ICD-10-CM

## 2017-06-19 DIAGNOSIS — G4733 Obstructive sleep apnea (adult) (pediatric): Secondary | ICD-10-CM

## 2017-06-20 ENCOUNTER — Inpatient Hospital Stay: Payer: Self-pay | Admitting: Family Medicine

## 2017-06-25 ENCOUNTER — Encounter: Payer: Self-pay | Admitting: *Deleted

## 2017-06-26 ENCOUNTER — Encounter: Payer: Self-pay | Admitting: *Deleted

## 2017-06-26 ENCOUNTER — Encounter: Payer: Self-pay | Admitting: Licensed Clinical Social Worker

## 2017-06-26 ENCOUNTER — Ambulatory Visit (INDEPENDENT_AMBULATORY_CARE_PROVIDER_SITE_OTHER): Payer: Medicare Other | Admitting: *Deleted

## 2017-06-26 VITALS — BP 138/68 | HR 70 | Temp 98.4°F | Ht 64.0 in | Wt 241.8 lb

## 2017-06-26 DIAGNOSIS — Z1211 Encounter for screening for malignant neoplasm of colon: Secondary | ICD-10-CM

## 2017-06-26 DIAGNOSIS — Z0000 Encounter for general adult medical examination without abnormal findings: Secondary | ICD-10-CM | POA: Diagnosis not present

## 2017-06-26 DIAGNOSIS — Z1159 Encounter for screening for other viral diseases: Secondary | ICD-10-CM | POA: Diagnosis not present

## 2017-06-26 NOTE — Progress Notes (Signed)
Subjective:   Tamara Henry is a 56 y.o. female who presents for an Initial Medicare Annual Wellness Visit.   Cardiac Risk Factors include: diabetes mellitus;dyslipidemia;hypertension;obesity (BMI >30kg/m2);smoking/ tobacco exposure     Objective:    Today's Vitals   06/26/17 1341  BP: 138/68  Pulse: 70  Temp: 98.4 F (36.9 C)  TempSrc: Oral  SpO2: 97%  Weight: 241 lb 12.8 oz (109.7 kg)  Height: 5\' 4"  (1.626 m)  PainSc: 9    Body mass index is 41.5 kg/m.   Current Medications (verified) Outpatient Encounter Prescriptions as of 06/26/2017  Medication Sig  . amLODipine (NORVASC) 10 MG tablet Take 1 tablet (10 mg total) by mouth daily.  Marland Kitchen aspirin 81 MG EC tablet Take 1 tablet (81 mg total) by mouth daily.  . citalopram (CELEXA) 20 MG tablet Take 1 tablet (20 mg total) by mouth daily.  . ferrous sulfate 325 (65 FE) MG tablet Take 1 tablet (325 mg total) by mouth daily with breakfast.  . folic acid (FOLVITE) 814 MCG tablet Take 400 mcg by mouth daily.  . furosemide (LASIX) 40 MG tablet Take 1 tablet (40 mg total) by mouth daily.  Marland Kitchen gabapentin (NEURONTIN) 300 MG capsule Take 900 mg by mouth in the morning, 600 mg at lunch, and 900 mg at bedtime  . HYDROcodone-acetaminophen (NORCO/VICODIN) 5-325 MG tablet Take 1 tablet by mouth 3 (three) times daily as needed for moderate pain.  Marland Kitchen insulin aspart (NOVOLOG) 100 UNIT/ML injection Inject 5 Units into the skin 2 (two) times daily before a meal.  . Insulin Glargine (BASAGLAR KWIKPEN) 100 UNIT/ML SOPN INJECT 50 UNITS INTO SKIN EACH NIGHT AT BEDTIME  . loperamide (IMODIUM) 2 MG capsule Take 1 capsule (2 mg total) by mouth daily as needed for diarrhea or loose stools.  . Melatonin 3 MG TABS Take 6 mg by mouth at bedtime.  . metoprolol tartrate (LOPRESSOR) 25 MG tablet Take 1 tablet (25 mg total) by mouth 2 (two) times daily.  . Multiple Vitamins-Minerals (MULTIVITAMIN) tablet Take 1 tablet by mouth daily.  Marland Kitchen omeprazole (PRILOSEC) 40 MG  capsule Take 1 capsule (40 mg total) by mouth daily.  . potassium chloride SA (K-DUR,KLOR-CON) 20 MEQ tablet take half a TABLET BY MOUTH EVERY MORNING (Patient taking differently: Take 72meq by mouthEVERY MORNING)  . Probiotic Product (ALIGN) 4 MG CAPS Take 1 capsule (4 mg total) by mouth daily.  . rivaroxaban (XARELTO) 10 MG TABS tablet Take 1 tablet (10 mg total) by mouth every morning.  Marland Kitchen tiZANidine (ZANAFLEX) 4 MG tablet TAKE 1 TABLET BY MOUTH EVERY 8 HOURS AS NEEDED FOR MUSCLE SPASMS (Patient taking differently: TAKE 4mg  BY MOUTH EVERY 8 HOURS AS NEEDED FOR MUSCLE SPASMS)  . atorvastatin (LIPITOR) 80 MG tablet Take 1 tablet (80 mg total) by mouth every evening.   No facility-administered encounter medications on file as of 06/26/2017.     Allergies (verified) Lactose intolerance (gi); Lisinopril; Reglan [metoclopramide]; Wellbutrin [bupropion]; Latex; Metformin and related; Penicillins; Sulfa antibiotics; and Tape   History: Past Medical History:  Diagnosis Date  . Acid reflux   . Anxiety   . Arthritis    back  . Asthma   . Back pain   . Bilateral pulmonary embolism ([place]) 05/2016  . Bipolar disorder ([place])   . CAD in native artery    S/P NSTEMI with cath showing severe 3 vessel ASCAD s/p CABGx4 07/28/16.  . Cataracts, bilateral   . CKD (chronic kidney disease), stage III ([place])  borderline CKD II-III  . Cocaine abuse ([place])    In remission. Stopped using in early 2000's  . Depression   . Diabetes mellitus ([place])    Type II  . GERD (gastroesophageal reflux disease)   . Glaucoma   . High cholesterol   . History of blood transfusion   . 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 Hx of chest tube placement right   . Hypertension   . Morbid obesity ([place])   . Myocardial infarction ([place])   . Neuropathy    hands and feet  . Pneumonia   . Postoperative anemia 07/2016  . PTSD (post-traumatic stress disorder)   . Stroke [place]) 2005   no residual   Past Surgical History:  Procedure Laterality  Date  . CARDIAC CATHETERIZATION N/A 07/25/2016   Procedure: Left Heart Cath and Coronary Angiography;  Surgeon: Peter M Martinique, MD;  Location: [facility];  Service: Cardiovascular;  Laterality: N/A;  . COLONOSCOPY W/ POLYPECTOMY    . CORONARY ARTERY BYPASS GRAFT N/A 07/28/2016   Procedure: CORONARY ARTERY BYPASS GRAFTING (CABG)x4 using left internal mammary and endoscopic harvest of right greater saphenous vein;  Surgeon: Ivin Poot, MD;  Location: [facility];  Service: Open Heart Surgery;  Laterality: N/A;  . DENTAL SURGERY     to removed broken teeth  . SHOULDER ARTHROSCOPY WITH ROTATOR CUFF REPAIR Left 02/19/2017   Procedure: LEFT SHOULDER ARTHROSCOPY with debridement andROTATOR CUFF REPAIR;  Surgeon: [name], MD;  Location: [facility];  Service: Orthopedics;  Laterality: Left;  . TEE WITHOUT CARDIOVERSION N/A 07/28/2016   Procedure: TRANSESOPHAGEAL ECHOCARDIOGRAM (TEE);  Surgeon: Ivin Poot, MD;  Location: [facility];  Service: Open Heart Surgery;  Laterality: N/A;  . TUBAL LIGATION     Family History  Problem Relation Age of Onset  . Pancreatitis Mother   . Diabetes type II Mother   . Diabetes Mother   . CAD Father   . Heart attack Father   . Diabetes type II Sister   . Diabetes Sister   . Diabetes type II Brother   . Diabetes Brother   . Diabetes Brother   . Diabetes Brother    Social History   Occupational History  . disabled    Social History Main Topics  . Smoking status: Former Smoker    Packs/day: 2.00    Years: 10.00    Types: Cigarettes    Quit date: 09/11/2005  . Smokeless tobacco: Never Used  . Alcohol use No  . Drug use: No  . Sexual activity: No    Tobacco Counseling Patient is former smoker with no plans to restart  Activities of Daily Living In your present state of health, do you have any difficulty performing the following activities: 06/26/2017 06/17/2017  Hearing? N N  Vision? Y N  Difficulty concentrating or making decisions? Y N    Walking or climbing stairs? N N  Dressing or bathing? N Y  Doing errands, shopping? Tempie Donning  Preparing Food and eating ? Y -  Using the Toilet? N -  In the past six months, have you accidently leaked urine? Y -  Do you have problems with loss of bowel control? Y -  Managing your Medications? Y -  Managing your Finances? Y -  Housekeeping or managing your Housekeeping? Y -  Some recent data might be hidden   Home Safety:  My home has a working smoke alarm:  Yes but needs to check batteries  My home throw rugs have been fastened down to the floor or removed:  Non-slip backs I have a non-slip surface or non-slip mats in the bathtub and shower:  No, discussed obtaining non-slip decals        All my home's stairs have handrails, including any outdoor stairs  Two level home with handrails inside and no outside steps.         My home's floors, stairs and hallways are free from clutter, wires and cords:  No, due to grandkids' toys     I have animals in my home  No I wear seatbelts consistently:  No, but will try to do better  Immunizations and Health Maintenance Immunization History  Administered Date(s) Administered  . Influenza,inj,Quad PF,6+ Mos 10/13/2016, 05/17/2017  . Pneumococcal Polysaccharide-23 01/15/2015   Health Maintenance Due  Topic Date Due  . Hepatitis C Screening  [birth year]  . OPHTHALMOLOGY EXAM  07/07/1973  . TETANUS/TDAP  07/07/1982  . COLONOSCOPY  07/07/2013   Hep C drawn today Eye exam within last month at Dr. Colan Neptune office. ROI signed to obtain record Patient will obtain TDaP at local pharmacy Referral placed for colonoscopy at [facility] Discussed Shingrix  Patient Care Team: Carlyle Dolly, MD as PCP - General (Family Medicine) [name], MD as Consulting Physician (Orthopedic Surgery) Alanda Slim Neena Rhymes, MD as Consulting Physician (Ophthalmology) Prescott Gum, Collier [place]ina, MD as Consulting Physician (Cardiothoracic Surgery)  Indicate any  recent Medical Services you may have received from other than [facility] providers in the past year (date may be approximate).     Assessment:   This is a routine wellness examination for Salayah.   Hearing/Vision screen  Hearing Screening   Method: Audiometry   125Hz  250Hz  500Hz  1000Hz  2000Hz  3000Hz  4000Hz  6000Hz  8000Hz   Right ear:   25 25 25  25     Left ear:   25 25 25  25       Dietary issues and exercise activities discussed: Current Exercise Habits: Structured exercise class (Silver Sneakers at [facility]), Type of exercise: walking (Stationary bike), Time (Minutes): 60, Frequency (Times/Week): 7, Weekly Exercise (Minutes/Week): 420, Intensity: Intense  Goals    . Continue socializing    . HEMOGLOBIN A1C < 7.0    . LDL CALC < 100    . Maintain current level of physical activity    . Weight (lb) < 224 lb (101.6 kg) (pt-stated)          7 % weight loss      Discussed recording consumption intake to assist with desired 7% weight loss. Recommended MyPLate or My Fitness Pal apps to assist with recording.   Depression Screen PHQ 2/9 Scores 06/26/2017 05/21/2017 05/17/2017 03/27/2017 03/21/2017 03/21/2017 02/06/2017  PHQ - 2 Score 5 0 0 4 2 5  0  PHQ- 9 Score 19 5 - - 9 - -   Warm hand off to In-house LCSW for PHQ-9 score of 19. PCP notified  Fall Risk Fall Risk  06/26/2017 03/27/2017 03/21/2017 02/06/2017 02/06/2017  Falls in the past year? Yes Yes Yes No Yes  Number falls in past yr: 2 or more 1 - - -  Comment - - - - -  Injury with Fall? Yes Yes Yes - Yes  Comment - - torn rotator cuff - -  Risk Factor Category  High Fall Risk - - - -  Risk for fall due to : History of fall(s);Impaired mobility;Impaired balance/gait - - - -  Follow up Falls evaluation completed;Education provided;Falls  prevention discussed Falls prevention discussed - - -   TUG Test:  Done in 8 seconds. Falls prevention discussed in detail and literature given.  Cognitive Function: Mini-Cog  Passed with score  4/5   Screening Tests Health Maintenance  Topic Date Due  . Hepatitis C Screening  1960-12-03  . OPHTHALMOLOGY EXAM  07/07/1973  . TETANUS/TDAP  07/07/1982  . COLONOSCOPY  07/07/2013  . HEMOGLOBIN A1C  11/14/2017  . FOOT EXAM  05/17/2018  . URINE MICROALBUMIN  05/17/2018  . PAP SMEAR  08/24/2018  . MAMMOGRAM  01/06/2019  . PNEUMOCOCCAL POLYSACCHARIDE VACCINE (2) 01/15/2020  . INFLUENZA VACCINE  Completed  . HIV Screening  Completed      Plan:     Hep C drawn today Eye exam within last month at Dr. Colan Neptune office. ROI signed to obtain record Patient will obtain TDaP at local pharmacy Referral placed for colonoscopy at [facility] Discussed Shingrix  Warm hand off to In-house LCSW for PHQ-9 score of 19 and food resources as patient sometimes worries there is not enough food in the home. PCP notified  Encouraged patient to schedule OV with PCP to discuss Disability Placard, restarting cardiac rehab and potential PT referral for fecal incontinence  Refill request for tizanidine sent in separate note    I have personally reviewed and noted the following in the patient's chart:   . Medical and social history . Use of alcohol, tobacco or illicit drugs  . Current medications and supplements . Functional ability and status . Nutritional status . Physical activity . Advanced directives . List of other physicians . Hospitalizations, surgeries, and ER visits in previous 12 months . Vitals . Screenings to include cognitive, depression, and falls . Referrals and appointments  In addition, I have reviewed and discussed with patient certain preventive protocols, quality metrics, and best practice recommendations. A written personalized care plan for preventive services as well as general preventive health recommendations were provided to patient.     Velora Heckler, RN   06/26/2017

## 2017-06-26 NOTE — Progress Notes (Signed)
ESTIMATE TIME:15 minutes Type of Service: [place] warm handoff  Interpreter:No.   SUBJECTIVE: Tamara Henry is a 56 y.o. female referred by Lauren RN for food insecurities . Patient reports she did not renew her stamps because she only received $17.00.   INTERVENTION:  Solution-Focused Strategies, Supportive Counseling and Link to [employer],  [place] 9=19,Severity: severe depression.   ISSUES DISCUSSED: Review of Integrated care services and f/u with shannon, support system, community resources. Review double buck program with food stamps, review legal resources.    ASSESSMENT:Patient currently experiencing symptoms of depression.  Symptoms exacerbated by social stressors. Patient may benefit from, and is in agreement to receive further assessment and brief therapeutic interventions to assist with managing her symptoms. Patient appreciative of information provided.   PLAN:   1.Patient will schedule f/u appointment with [facility]  2.Referral:[facility] (In Clinic),  .3. Patient will F/U on food resources provided.  Warm Hand Off Completed.     Casimer Lanius, LCSW Licensed Clinical Social Worker [place]   [phone] 3:49 PM

## 2017-06-26 NOTE — Patient Instructions (Addendum)
Tamara Henry,  Thank you for taking time to come for yourMedicare Wellness Visit. I appreciate your ongoing commitment to your health goals. Please review the following plan we discussed and let me know if I can assist you in the future.   These are the goals we discussed:  Goals    . Continue socializing    . HEMOGLOBIN A1C < 7.0    . LDL CALC < 100    . Maintain current level of physical activity    . Weight (lb) < 224 lb (101.6 kg) (pt-stated)          7 % weight loss         Fat and Cholesterol Restricted Diet Getting too much fat and cholesterol in your diet may cause health problems. Following this diet helps keep your fat and cholesterol at normal levels. This can keep you from getting sick. What types of fat should I choose?  Choose monosaturated and polyunsaturated fats. These are found in foods such as olive oil, canola oil, flaxseeds, walnuts, almonds, and seeds.  Eat more omega-3 fats. Good choices include salmon, mackerel, sardines, tuna, flaxseed oil, and ground flaxseeds.  Limit saturated fats. These are in animal products such as meats, butter, and cream. They can also be in plant products such as palm oil, palm kernel oil, and coconut oil.  Avoid foods with partially hydrogenated oils in them. These contain trans fats. Examples of foods that have trans fats are stick margarine, some tub margarines, cookies, crackers, and other baked goods. What general guidelines do I need to follow?  Check food labels. Look for the words "trans fat" and "saturated fat."  When preparing a meal: ? Fill half of your plate with vegetables and green salads. ? Fill one fourth of your plate with whole grains. Look for the word "whole" as the first word in the ingredient list. ? Fill one fourth of your plate with lean protein foods.  Eat more foods that have fiber, like apples, carrots, beans, peas, and barley.  Eat more home-cooked foods. Eat less at restaurants and  buffets.  Limit or avoid alcohol.  Limit foods high in starch and sugar.  Limit fried foods.  Cook foods without frying them. Baking, boiling, grilling, and broiling are all great options.  Lose weight if you are overweight. Losing even a small amount of weight can help your overall health. It can also help prevent diseases such as diabetes and heart disease. What foods can I eat? Grains Whole grains, such as whole wheat or whole grain breads, crackers, cereals, and pasta. Unsweetened oatmeal, bulgur, barley, quinoa, or brown rice. Corn or whole wheat flour tortillas. Vegetables Fresh or frozen vegetables (raw, steamed, roasted, or grilled). Green salads. Fruits All fresh, canned (in natural juice), or frozen fruits. Meat and Other Protein Products Ground beef (85% or leaner), grass-fed beef, or beef trimmed of fat. Skinless chicken or [place]. Ground chicken or [place]. Pork trimmed of fat. All fish and seafood. Eggs. Dried beans, peas, or lentils. Unsalted nuts or seeds. Unsalted canned or dry beans. Dairy Low-fat dairy products, such as skim or 1% milk, 2% or reduced-fat cheeses, low-fat ricotta or cottage cheese, or plain low-fat yogurt. Fats and Oils Tub margarines without trans fats. Light or reduced-fat mayonnaise and salad dressings. Avocado. Olive, canola, sesame, or safflower oils. Natural peanut or almond butter (choose ones without added sugar and oil). The items listed above may not be a complete list of recommended foods or beverages.  Contact your dietitian for more options. What foods are not recommended? Grains White bread. White pasta. White rice. Cornbread. Bagels, pastries, and croissants. Crackers that contain trans fat. Vegetables White potatoes. Corn. Creamed or fried vegetables. Vegetables in a cheese sauce. Fruits Dried fruits. Canned fruit in light or heavy syrup. Fruit juice. Meat and Other Protein Products Fatty cuts of meat. Ribs, chicken wings, bacon,  sausage, bologna, salami, chitterlings, fatback, hot dogs, bratwurst, and packaged luncheon meats. Liver and organ meats. Dairy Whole or 2% milk, cream, half-and-half, and cream cheese. Whole milk cheeses. Whole-fat or sweetened yogurt. Full-fat cheeses. Nondairy creamers and whipped toppings. Processed cheese, cheese spreads, or cheese curds. Sweets and Desserts Corn syrup, sugars, honey, and molasses. Candy. Jam and jelly. Syrup. Sweetened cereals. Cookies, pies, cakes, donuts, muffins, and ice cream. Fats and Oils Butter, stick margarine, lard, shortening, ghee, or bacon fat. Coconut, palm kernel, or palm oils. Beverages Alcohol. Sweetened drinks (such as sodas, lemonade, and fruit drinks or punches). The items listed above may not be a complete list of foods and beverages to avoid. Contact your dietitian for more information. This information is not intended to replace advice given to you by your health care provider. Make sure you discuss any questions you have with your health care provider. Document Released: 02/27/2012 Document Revised: 05/04/2016 Document Reviewed: 11/27/2013 Elsevier Interactive Patient Education  2018 [employer].   Diabetes and Foot Care Diabetes may cause you to have problems because of poor blood supply (circulation) to your feet and legs. This may cause the skin on your feet to become thinner, break easier, and heal more slowly. Your skin may become dry, and the skin may peel and crack. You may also have nerve damage in your legs and feet causing decreased feeling in them. You may not notice minor injuries to your feet that could lead to infections or more serious problems. Taking care of your feet is one of the most important things you can do for yourself. Follow these instructions at home:  Wear shoes at all times, even in the house. Do not go barefoot. Bare feet are easily injured.  Check your feet daily for blisters, cuts, and redness. If you cannot see the  bottom of your feet, use a mirror or ask someone for help.  Wash your feet with warm water (do not use hot water) and mild soap. Then pat your feet and the areas between your toes until they are completely dry. Do not soak your feet as this can dry your skin.  Apply a moisturizing lotion or petroleum jelly (that does not contain alcohol and is unscented) to the skin on your feet and to dry, brittle toenails. Do not apply lotion between your toes.  Trim your toenails straight across. Do not dig under them or around the cuticle. File the edges of your nails with an emery board or nail file.  Do not cut corns or calluses or try to remove them with medicine.  Wear clean socks or stockings every day. Make sure they are not too tight. Do not wear knee-high stockings since they may decrease blood flow to your legs.  Wear shoes that fit properly and have enough cushioning. To break in new shoes, wear them for just a few hours a day. This prevents you from injuring your feet. Always look in your shoes before you put them on to be sure there are no objects inside.  Do not cross your legs. This may decrease the blood flow  to your feet.  If you find a minor scrape, cut, or break in the skin on your feet, keep it and the skin around it clean and dry. These areas may be cleansed with mild soap and water. Do not cleanse the area with peroxide, alcohol, or iodine.  When you remove an adhesive bandage, be sure not to damage the skin around it.  If you have a wound, look at it several times a day to make sure it is healing.  Do not use heating pads or hot water bottles. They may burn your skin. If you have lost feeling in your feet or legs, you may not know it is happening until it is too late.  Make sure your health care provider performs a complete foot exam at least annually or more often if you have foot problems. Report any cuts, sores, or bruises to your health care provider immediately. Contact a  health care provider if:  You have an injury that is not healing.  You have cuts or breaks in the skin.  You have an ingrown nail.  You notice redness on your legs or feet.  You feel burning or tingling in your legs or feet.  You have pain or cramps in your legs and feet.  Your legs or feet are numb.  Your feet always feel cold. Get help right away if:  There is increasing redness, swelling, or pain in or around a wound.  There is a red line that goes up your leg.  Pus is coming from a wound.  You develop a fever or as directed by your health care provider.  You notice a bad smell coming from an ulcer or wound. This information is not intended to replace advice given to you by your health care provider. Make sure you discuss any questions you have with your health care provider. Document Released: 08/25/2000 Document Revised: 02/03/2016 Document Reviewed: 02/04/2013 Elsevier Interactive Patient Education  2017 [place] Prevention in the Home Falls can cause injuries. They can happen to people of all ages. There are many things you can do to make your home safe and to help prevent falls. What can I do on the outside of my home?  Regularly fix the edges of walkways and driveways and fix any cracks.  Remove anything that might make you trip as you walk through a door, such as a raised step or threshold.  Trim any bushes or trees on the path to your home.  Use bright outdoor lighting.  Clear any walking paths of anything that might make someone trip, such as rocks or tools.  Regularly check to see if handrails are loose or broken. Make sure that both sides of any steps have handrails.  Any raised decks and porches should have guardrails on the edges.  Have any leaves, snow, or ice cleared regularly.  Use sand or salt on walking paths during winter.  Clean up any spills in your garage right away. This includes oil or grease spills. What can I do in the  bathroom?  Use night lights.  Install grab bars by the toilet and in the tub and shower. Do not use towel bars as grab bars.  Use non-skid mats or decals in the tub or shower.  If you need to sit down in the shower, use a plastic, non-slip stool.  Keep the floor dry. Clean up any water that spills on the floor as soon as it happens.  Remove  soap buildup in the tub or shower regularly.  Attach bath mats securely with double-sided non-slip rug tape.  Do not have throw rugs and other things on the floor that can make you trip. What can I do in the bedroom?  Use night lights.  Make sure that you have a light by your bed that is easy to reach.  Do not use any sheets or blankets that are too big for your bed. They should not hang down onto the floor.  Have a firm chair that has side arms. You can use this for support while you get dressed.  Do not have throw rugs and other things on the floor that can make you trip. What can I do in the kitchen?  Clean up any spills right away.  Avoid walking on wet floors.  Keep items that you use a lot in easy-to-reach places.  If you need to reach something above you, use a strong step stool that has a grab bar.  Keep electrical cords out of the way.  Do not use floor polish or wax that makes floors slippery. If you must use wax, use non-skid floor wax.  Do not have throw rugs and other things on the floor that can make you trip. What can I do with my stairs?  Do not leave any items on the stairs.  Make sure that there are handrails on both sides of the stairs and use them. Fix handrails that are broken or loose. Make sure that handrails are as long as the stairways.  Check any carpeting to make sure that it is firmly attached to the stairs. Fix any carpet that is loose or worn.  Avoid having throw rugs at the top or bottom of the stairs. If you do have throw rugs, attach them to the floor with carpet tape.  Make sure that you have a  light switch at the top of the stairs and the bottom of the stairs. If you do not have them, ask someone to add them for you. What else can I do to help prevent falls?  Wear shoes that: ? Do not have high heels. ? Have rubber bottoms. ? Are comfortable and fit you well. ? Are closed at the toe. Do not wear sandals.  If you use a stepladder: ? Make sure that it is fully opened. Do not climb a closed stepladder. ? Make sure that both sides of the stepladder are locked into place. ? Ask someone to hold it for you, if possible.  Clearly mark and make sure that you can see: ? Any grab bars or handrails. ? First and last steps. ? Where the edge of each step is.  Use tools that help you move around (mobility aids) if they are needed. These include: ? Canes. ? Walkers. ? Scooters. ? Crutches.  Turn on the lights when you go into a dark area. Replace any light bulbs as soon as they burn out.  Set up your furniture so you have a clear path. Avoid moving your furniture around.  If any of your floors are uneven, fix them.  If there are any pets around you, be aware of where they are.  Review your medicines with your doctor. Some medicines can make you feel dizzy. This can increase your chance of falling. Ask your doctor what other things that you can do to help prevent falls. This information is not intended to replace advice given to you by your health care provider.  Make sure you discuss any questions you have with your health care provider. Document Released: 06/24/2009 Document Revised: 02/03/2016 Document Reviewed: 10/02/2014 Elsevier Interactive Patient Education  2018 [place] Maintenance, Female Adopting a healthy lifestyle and getting preventive care can go a long way to promote health and wellness. Talk with your health care provider about what schedule of regular examinations is right for you. This is a good chance for you to check in with your provider about  disease prevention and staying healthy. In between checkups, there are plenty of things you can do on your own. Experts have done a lot of research about which lifestyle changes and preventive measures are most likely to keep you healthy. Ask your health care provider for more information. Weight and diet Eat a healthy diet  Be sure to include plenty of vegetables, fruits, low-fat dairy products, and lean protein.  Do not eat a lot of foods high in solid fats, added sugars, or salt.  Get regular exercise. This is one of the most important things you can do for your health. ? Most adults should exercise for at least 150 minutes each week. The exercise should increase your heart rate and make you sweat (moderate-intensity exercise). ? Most adults should also do strengthening exercises at least twice a week. This is in addition to the moderate-intensity exercise.  Maintain a healthy weight  Body mass index (BMI) is a measurement that can be used to identify possible weight problems. It estimates body fat based on height and weight. Your health care provider can help determine your BMI and help you achieve or maintain a healthy weight.  For females 28 years of age and older: ? A BMI below 18.5 is considered underweight. ? A BMI of 18.5 to 24.9 is normal. ? A BMI of 25 to 29.9 is considered overweight. ? A BMI of 30 and above is considered obese.  Watch levels of cholesterol and blood lipids  You should start having your blood tested for lipids and cholesterol at 54 years of age, then have this test every 5 years.  You may need to have your cholesterol levels checked more often if: ? Your lipid or cholesterol levels are high. ? You are older than 54 years of age. ? You are at high risk for heart disease.  Cancer screening Lung Cancer  Lung cancer screening is recommended for adults 72-23 years old who are at high risk for lung cancer because of a history of smoking.  A yearly low-dose  CT scan of the lungs is recommended for people who: ? Currently smoke. ? Have quit within the past 15 years. ? Have at least a 30-pack-year history of smoking. A pack year is smoking an average of one pack of cigarettes a day for 1 year.  Yearly screening should continue until it has been 15 years since you quit.  Yearly screening should stop if you develop a health problem that would prevent you from having lung cancer treatment.  Breast Cancer  Practice breast self-awareness. This means understanding how your breasts normally appear and feel.  It also means doing regular breast self-exams. Let your health care provider know about any changes, no matter how small.  If you are in your 20s or 30s, you should have a clinical breast exam (CBE) by a health care provider every 1-3 years as part of a regular health exam.  If you are 43 or older, have a CBE every year. Also consider having  a breast X-ray (mammogram) every year.  If you have a family history of breast cancer, talk to your health care provider about genetic screening.  If you are at high risk for breast cancer, talk to your health care provider about having an MRI and a mammogram every year.  Breast cancer gene (BRCA) assessment is recommended for women who have family members with BRCA-related cancers. BRCA-related cancers include: ? Breast. ? Ovarian. ? Tubal. ? Peritoneal cancers.  Results of the assessment will determine the need for genetic counseling and BRCA1 and BRCA2 testing.  Cervical Cancer Your health care provider may recommend that you be screened regularly for cancer of the pelvic organs (ovaries, uterus, and vagina). This screening involves a pelvic examination, including checking for microscopic changes to the surface of your cervix (Pap test). You may be encouraged to have this screening done every 3 years, beginning at age 88.  For women ages 58-65, health care providers may recommend pelvic exams and Pap  testing every 3 years, or they may recommend the Pap and pelvic exam, combined with testing for human papilloma virus (HPV), every 5 years. Some types of HPV increase your risk of cervical cancer. Testing for HPV may also be done on women of any age with unclear Pap test results.  Other health care providers may not recommend any screening for nonpregnant women who are considered low risk for pelvic cancer and who do not have symptoms. Ask your health care provider if a screening pelvic exam is right for you.  If you have had past treatment for cervical cancer or a condition that could lead to cancer, you need Pap tests and screening for cancer for at least 20 years after your treatment. If Pap tests have been discontinued, your risk factors (such as having a new sexual partner) need to be reassessed to determine if screening should resume. Some women have medical problems that increase the chance of getting cervical cancer. In these cases, your health care provider may recommend more frequent screening and Pap tests.  Colorectal Cancer  This type of cancer can be detected and often prevented.  Routine colorectal cancer screening usually begins at 54 years of age and continues through 54 years of age.  Your health care provider may recommend screening at an earlier age if you have risk factors for colon cancer.  Your health care provider may also recommend using home test kits to check for hidden blood in the stool.  A small camera at the end of a tube can be used to examine your colon directly (sigmoidoscopy or colonoscopy). This is done to check for the earliest forms of colorectal cancer.  Routine screening usually begins at age 39.  Direct examination of the colon should be repeated every 5-10 years through 54 years of age. However, you may need to be screened more often if early forms of precancerous polyps or small growths are found.  Skin Cancer  Check your skin from head to toe  regularly.  Tell your health care provider about any new moles or changes in moles, especially if there is a change in a mole's shape or color.  Also tell your health care provider if you have a mole that is larger than the size of a pencil eraser.  Always use sunscreen. Apply sunscreen liberally and repeatedly throughout the day.  Protect yourself by wearing long sleeves, pants, a wide-brimmed hat, and sunglasses whenever you are outside.  Heart disease, diabetes, and high blood pressure  High blood pressure causes heart disease and increases the risk of stroke. High blood pressure is more likely to develop in: ? People who have blood pressure in the high end of the normal range (130-139/85-89 mm Hg). ? People who are overweight or obese. ? People who are African American.  If you are 26-32 years of age, have your blood pressure checked every 3-5 years. If you are 69 years of age or older, have your blood pressure checked every year. You should have your blood pressure measured twice-once when you are at a hospital or clinic, and once when you are not at a hospital or clinic. Record the average of the two measurements. To check your blood pressure when you are not at a hospital or clinic, you can use: ? An automated blood pressure machine at a pharmacy. ? A home blood pressure monitor.  If you are between 60 years and 108 years old, ask your health care provider if you should take aspirin to prevent strokes.  Have regular diabetes screenings. This involves taking a blood sample to check your fasting blood sugar level. ? If you are at a normal weight and have a low risk for diabetes, have this test once every three years after 54 years of age. ? If you are overweight and have a high risk for diabetes, consider being tested at a younger age or more often. Preventing infection Hepatitis B  If you have a higher risk for hepatitis B, you should be screened for this virus. You are considered  at high risk for hepatitis B if: ? You were born in a country where hepatitis B is common. Ask your health care provider which countries are considered high risk. ? Your parents were born in a high-risk country, and you have not been immunized against hepatitis B (hepatitis B vaccine). ? You have HIV or AIDS. ? You use needles to inject street drugs. ? You live with someone who has hepatitis B. ? You have had sex with someone who has hepatitis B. ? You get hemodialysis treatment. ? You take certain medicines for conditions, including cancer, organ transplantation, and autoimmune conditions.  Hepatitis C  Blood testing is recommended for: ? Everyone born from [birth year] through 1963. ? Anyone with known risk factors for hepatitis C.  Sexually transmitted infections (STIs)  You should be screened for sexually transmitted infections (STIs) including gonorrhea and chlamydia if: ? You are sexually active and are younger than 54 years of age. ? You are older than 54 years of age and your health care provider tells you that you are at risk for this type of infection. ? Your sexual activity has changed since you were last screened and you are at an increased risk for chlamydia or gonorrhea. Ask your health care provider if you are at risk.  If you do not have HIV, but are at risk, it may be recommended that you take a prescription medicine daily to prevent HIV infection. This is called pre-exposure prophylaxis (PrEP). You are considered at risk if: ? You are sexually active and do not regularly use condoms or know the HIV status of your partner(s). ? You take drugs by injection. ? You are sexually active with a partner who has HIV.  Talk with your health care provider about whether you are at high risk of being infected with HIV. If you choose to begin PrEP, you should first be tested for HIV. You should then be tested every 3 months for  as long as you are taking PrEP. Pregnancy  If you are  premenopausal and you may become pregnant, ask your health care provider about preconception counseling.  If you may become pregnant, take 400 to 800 micrograms (mcg) of folic acid every day.  If you want to prevent pregnancy, talk to your health care provider about birth control (contraception). Osteoporosis and menopause  Osteoporosis is a disease in which the bones lose minerals and strength with aging. This can result in serious bone fractures. Your risk for osteoporosis can be identified using a bone density scan.  If you are 45 years of age or older, or if you are at risk for osteoporosis and fractures, ask your health care provider if you should be screened.  Ask your health care provider whether you should take a calcium or vitamin D supplement to lower your risk for osteoporosis.  Menopause may have certain physical symptoms and risks.  Hormone replacement therapy may reduce some of these symptoms and risks. Talk to your health care provider about whether hormone replacement therapy is right for you. Follow these instructions at home:  Schedule regular health, dental, and eye exams.  Stay current with your immunizations.  Do not use any tobacco products including cigarettes, chewing tobacco, or electronic cigarettes.  If you are pregnant, do not drink alcohol.  If you are breastfeeding, limit how much and how often you drink alcohol.  Limit alcohol intake to no more than 1 drink per day for nonpregnant women. One drink equals 12 ounces of beer, 5 ounces of wine, or 1 ounces of hard liquor.  Do not use street drugs.  Do not share needles.  Ask your health care provider for help if you need support or information about quitting drugs.  Tell your health care provider if you often feel depressed.  Tell your health care provider if you have ever been abused or do not feel safe at home. This information is not intended to replace advice given to you by your health care  provider. Make sure you discuss any questions you have with your health care provider. Document Released: 03/13/2011 Document Revised: 02/03/2016 Document Reviewed: 06/01/2015 Elsevier Interactive Patient Education  [employer].

## 2017-06-27 ENCOUNTER — Ambulatory Visit (INDEPENDENT_AMBULATORY_CARE_PROVIDER_SITE_OTHER): Payer: Medicare Other | Admitting: Licensed Clinical Social Worker

## 2017-06-27 ENCOUNTER — Ambulatory Visit (INDEPENDENT_AMBULATORY_CARE_PROVIDER_SITE_OTHER): Payer: Medicare Other | Admitting: Orthopaedic Surgery

## 2017-06-27 ENCOUNTER — Other Ambulatory Visit: Payer: Self-pay | Admitting: *Deleted

## 2017-06-27 ENCOUNTER — Encounter (INDEPENDENT_AMBULATORY_CARE_PROVIDER_SITE_OTHER): Payer: Self-pay | Admitting: Orthopaedic Surgery

## 2017-06-27 ENCOUNTER — Ambulatory Visit (INDEPENDENT_AMBULATORY_CARE_PROVIDER_SITE_OTHER): Payer: Medicare Other

## 2017-06-27 VITALS — BP 137/86 | HR 73 | Ht 64.0 in | Wt 241.0 lb

## 2017-06-27 DIAGNOSIS — Q7649 Other congenital malformations of spine, not associated with scoliosis: Secondary | ICD-10-CM | POA: Diagnosis not present

## 2017-06-27 DIAGNOSIS — G8929 Other chronic pain: Secondary | ICD-10-CM

## 2017-06-27 DIAGNOSIS — M47812 Spondylosis without myelopathy or radiculopathy, cervical region: Secondary | ICD-10-CM

## 2017-06-27 DIAGNOSIS — R4589 Other symptoms and signs involving emotional state: Secondary | ICD-10-CM

## 2017-06-27 DIAGNOSIS — M25562 Pain in left knee: Secondary | ICD-10-CM

## 2017-06-27 DIAGNOSIS — F329 Major depressive disorder, single episode, unspecified: Secondary | ICD-10-CM

## 2017-06-27 DIAGNOSIS — I2510 Atherosclerotic heart disease of native coronary artery without angina pectoris: Secondary | ICD-10-CM | POA: Diagnosis not present

## 2017-06-27 LAB — HEPATITIS C ANTIBODY: HEP C VIRUS AB: 0.1 {s_co_ratio} (ref 0.0–0.9)

## 2017-06-27 MED ORDER — TIZANIDINE HCL 4 MG PO TABS: ORAL_TABLET | ORAL | 1 refills | Status: DC

## 2017-06-27 NOTE — Progress Notes (Signed)
  Total time:30 minutes Type of Service: Integrated Behavioral Health F/U Interpretor:No.   SUBJECTIVE: Tamara Henry is a 56 y.o. female  Patient referred by Ander Purpura, RN after Medicare Wellness visit.    Patient lives with daughter and 4 grandchildren.  Receives disability.    Reason for follow-up: Continue brief intervention to assist with managing symptoms of depression, and psycho-social stressors. Patient's appearance:Neat; Mood: pleasant ; Thought process: Coherent; Affect: Appropriate and Tearful at times. Patient states she neglects herself and tries to help her adult sons.  She wants to do something special for herself this month. Risk of harm to self or others: No plan to harm self or others . PHQ-9 from yesterday was 19 indication of severe depression  GOALS: Patient will reduce symptoms of: depression and stress , and increase knowledge and / or ability LP:FXTKWI skills, self-management skills and stress reduction. Intervention: Motivational Interviewing, Solution-Focused Strategies, Veterinary surgeon and Supportive Counseling, Reflective listening, Behavioral Therapy (Relaxed breathing), Psychoeducation and Referral to Counselor/Psychotherapist   Issues discussed: family stressors with sons ; loss of freedom without having a car ;  Chronic medical conditions, self-care action plan, establishing with psychiatry and counseling, personal goals and previous experience in therapy with [facility]. Patient will f/u on legal resources provided as well as food program and getting food stamps.  ASSESSMENT:Patient continues to experience symptoms of depression.  Symptoms exacerbated by family stressors and concern of not having a car.  Patient may benefit from, and is in agreement to continue further assessment and brief therapeutic interventions to assist with managing her symptoms.   PLAN: 1. Patient will F/U with LCSW  When she returns from out of town 2. Behavioral recommendations:  Behavioral Activation with Self-care action plan 3. Referral: Psychiatrist and Counselor, [employer] program for healthy eating  Casimer Lanius, LCSW Licensed Clinical Social Worker [place]   [phone] 3:51 PM

## 2017-06-28 NOTE — Progress Notes (Signed)
Office Visit Note   Patient: Tamara Henry           Date of Birth: [birth year]           MRN: [id number] Visit Date: 06/27/2017              Requested by: Carlyle Dolly, MD [place], [place] [id number] PCP: Carlyle Dolly, MD   Assessment & Plan: Visit Diagnoses:  1. Chronic pain of left knee   2. Spondylosis without myelopathy or radiculopathy, cervical region     Plan: We reviewed cervical MRI and lumbar MRI with the patient as well as knee radiographs. She needs to work on walking program, dieting, weight loss to help her  hypertension, diabetes as well as her back symptoms and  Knee  symptoms. I will recheck her in 6 months. No myelopathic changes at this point. Negative for neurogenic claudication. She has about a handicap plate card and I discussed with her she needs to work on more walking rather than less. Recheck 6 months.  Follow-Up Instructions: Return in about 6 months (around 12/26/2017).   Orders:  Orders Placed This Encounter  Procedures  . XR KNEE 3 VIEW LEFT   No orders of the defined types were placed in this encounter.     Procedures: No procedures performed   Clinical Data: No additional findings.   Subjective: Chief Complaint  Patient presents with  . Lower Back - Pain, Follow-up  . Left Knee - Pain    HPI 56 year old female with chronic neck pain with some numbness and tingling tends sometimes gone her left arm but she states this has been better. She continues to have some back pain with right leg pain and states she is not having any weakness in her legs. She's had some pain in her left knee when she goes up and down steps. She is K last some popping has pain more medial as well as patellofemoral and lateral knee joints. No true locking. She denies fever or chills. No claudication symptoms. Patient has type 2 diabetes with renal complications, hyperlipidemia and history of bilateral pulmonary embolism.  Review of Systems  review of systems positive for hypertension, hyperlipidemia, history of bilateral pulmonary embolism, peripheral neuropathy, stage III kidney disease. Morbid obesity. Asthma. History of impingement left shoulder. CABG 4. Knee pain. Positive for chronic neck pain. Otherwise negative as it pertains to history of present illness.   Objective: Vital Signs: BP 137/86   Pulse 73   Ht 5\' 4"  (1.626 m)   Wt 241 lb (109.3 kg)   BMI 41.37 kg/m   Physical Exam  Constitutional: She is oriented to person, place, and time. She appears well-developed.  HENT:  Head: Normocephalic.  Right Ear: External ear normal.  Left Ear: External ear normal.  Eyes: Pupils are equal, round, and reactive to light.  Neck: No tracheal deviation present. No thyromegaly present.  Cardiovascular: Normal rate.   Pulmonary/Chest: Effort normal.  Abdominal: Soft.  Truncal obesity. No abdominal tenderness.  Neurological: She is alert and oriented to person, place, and time.  Skin: Skin is warm and dry.  Psychiatric: She has a normal mood and affect. Her behavior is normal.    Ortho Exam normal hip range of motion negative straight leg raising 90. Ankle jerk are intact and symmetrical anterior tib EHL is strong. She has palpable distal pulses. No pain with extremes of internal rotation right or left hip. She has some brachial plexus tenderness right  and left. Good cervical flexion. No lower extremity clonus. Bilateral knee crepitus with medial joint line tenderness.  Specialty Comments:  No specialty comments available.  IMPRESSION: 1. Mild spinal stenosis from C3-4 to C6-7 due to disc bulges and small protrusions. 2. Diffusely patent foramina with mild narrowing on the left at C6-7. 3. T2-3 small disc protrusion without cord impingement.   Electronically Signed   By: Monte Fantasia M.D.   On: 12/07/2016 16:15 Imaging: contrast  Study Result   CLINICAL DATA:  Chronic right-sided low back pain extending  to the right leg. Right leg weakness.  EXAM: MRI LUMBAR SPINE WITHOUT CONTRAST  TECHNIQUE: Multiplanar, multisequence MR imaging of the lumbar spine was performed. No intravenous contrast was administered.  COMPARISON:  Radiography 06/06/2017.  MRI 01/13/2015.  FINDINGS: Segmentation:  5 lumbar type vertebral bodies.  Alignment:  Normal  Vertebrae:  Normal  Conus medullaris: Extends to the L1 level and appears normal.  Paraspinal and other soft tissues: Negative  Disc levels:  No abnormality at T12-L1 or L1-2.  L2-3: No disc abnormality. Bilateral facet and ligamentous hypertrophy. No compressive stenosis.  L3-4: Disc degeneration with bilateral foraminal to extraforaminal protrusions. Facet and ligamentous hypertrophy. Mild central canal stenosis. Foraminal encroachment that would have some potential to affect either or both L3 nerves. This is more pronounced on the left.  L4-5: Disc degeneration with bilateral foraminal to extraforaminal protrusions. Facet and ligamentous hypertrophy. Mild stenosis of the central canal and lateral recesses. Foraminal to extraforaminal encroachment would have some potential to affect either or both L4 nerves.  L5-S1: Broad-based disc herniation more prominent towards the left. Facet and ligamentous hypertrophy. Narrowing of the subarticular lateral recesses left more than right. Left S1 nerve root compression could occur.  Findings appear similar to the study of May 2016.  IMPRESSION: L2-3:  Facet and ligamentous hypertrophy.  No compressive stenosis.  L3-4: Bilateral foraminal to extraforaminal protrusions more prominent on the left. Facet and ligamentous hypertrophy. Multifactorial stenosis. Potential for compression of the exiting L3 nerves, particularly on the left.  L4-5: Bilateral foraminal to extraforaminal protrusions. Facet and ligamentous hypertrophy. Mild canal and lateral recess  stenosis. Potential for compression of the exiting L4 nerves.  L5-S1: Left posterolateral prominent disc herniation. Subarticular lateral recess on the left that could compress the left S1 nerve.   Electronically Signed   By: Nelson Chimes M.D.   On: 06/15/2017 14:21   contrast  Study Result    Study Result      PMFS History: Patient Active Problem List   Diagnosis Date Noted  . Arm pain, anterior, right 06/17/2017  . OSA on CPAP 05/03/2017  . Morbid obesity due to excess calories ([place]) 05/03/2017  . Vaginal discharge 03/27/2017  . Microscopic hematuria 03/27/2017  . Impingement syndrome of left shoulder 02/19/2017  . Hypersomnia 12/12/2016  . REM behavioral disorder 12/12/2016  . Asthma 12/12/2016  . Flatulence 11/17/2016  . Neck pain 11/17/2016  . CKD (chronic kidney disease), stage III ([place]) 11/02/2016  . At risk for sleep apnea 11/01/2016  . CAD (coronary artery disease), native coronary artery 10/26/2016  . Encounter for post surgical wound check 10/23/2016  . Poor social situation 10/13/2016  . Anemia 08/30/2016  . Leg pain 08/30/2016  . Bilateral low back pain without sciatica 08/30/2016  . 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 Bilateral pulmonary embolism ([place]) 05/17/2016  . Hyperlipidemia 05/17/2016  . Depression 05/17/2016  . Diabetic peripheral neuropathy ([place]) 05/17/2016  . Neuritis of upper  extremity, C7 01/15/2015  . DM type 2, uncontrolled, with renal complications ([place]) 02/72/5366  . Hypertension 01/14/2015  . Numbness and tingling of right arm 01/14/2015   Past Medical History:  Diagnosis Date  . Acid reflux   . Anxiety   . Arthritis    back  . Asthma   . Back pain   . Bilateral pulmonary embolism ([place]) 05/2016  . Bipolar disorder ([place])   . CAD in native artery    S/P NSTEMI with cath showing severe 3 vessel ASCAD s/p CABGx4 07/28/16.  . Cataracts, bilateral   . CKD (chronic kidney disease), stage III ([facility])    borderline CKD II-III  . 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 Stopped using in early 2000's  . Depression   . Diabetes mellitus ([place])    Type II  . GERD (gastroesophageal reflux disease)   . Glaucoma   . High cholesterol   . History of blood transfusion   . 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 Hx of chest tube placement right   . Hypertension   . Morbid obesity ([place])   . Myocardial infarction ([place])   . Neuropathy    hands and feet  . Pneumonia   . Postoperative anemia 07/2016  . PTSD (post-traumatic stress disorder)   . Stroke [facility]) 2005   no residual    Family History  Problem Relation Age of Onset  . Pancreatitis Mother   . Diabetes type II Mother   . Diabetes Mother   . CAD Father   . Heart attack Father   . Diabetes type II Sister   . Diabetes Sister   . Diabetes type II Brother   . Diabetes Brother   . Diabetes Brother   . Diabetes Brother     Past Surgical History:  Procedure Laterality Date  . CARDIAC CATHETERIZATION N/A 07/25/2016   Procedure: Left Heart Cath and Coronary Angiography;  Surgeon: Peter M Martinique, MD;  Location: [facility];  Service: Cardiovascular;  Laterality: N/A;  . COLONOSCOPY W/ POLYPECTOMY    . CORONARY ARTERY BYPASS GRAFT N/A 07/28/2016   Procedure: CORONARY ARTERY BYPASS GRAFTING (CABG)x4 using left internal mammary and endoscopic harvest of right greater saphenous vein;  Surgeon: Ivin Poot, MD;  Location: [facility];  Service: Open Heart Surgery;  Laterality: N/A;  . DENTAL SURGERY     to removed broken teeth  . SHOULDER ARTHROSCOPY WITH ROTATOR CUFF REPAIR Left 02/19/2017   Procedure: LEFT SHOULDER ARTHROSCOPY with debridement andROTATOR CUFF REPAIR;  Surgeon: [name], MD;  Location: [facility];  Service: Orthopedics;  Laterality: Left;  . TEE WITHOUT CARDIOVERSION N/A 07/28/2016   Procedure: TRANSESOPHAGEAL ECHOCARDIOGRAM (TEE);  Surgeon: Ivin Poot, MD;  Location: [facility];  Service: Open Heart Surgery;  Laterality: N/A;  . TUBAL LIGATION     Social History    Occupational History  . disabled    Social History Main Topics  . Smoking status: Former Smoker    Packs/day: 2.00    Years: 10.00    Types: Cigarettes    Quit date: 09/11/2005  . Smokeless tobacco: Never Used  . Alcohol use No  . Drug use: No  . Sexual activity: No

## 2017-06-29 ENCOUNTER — Telehealth: Payer: Self-pay | Admitting: *Deleted

## 2017-06-29 NOTE — Telephone Encounter (Signed)
Patient left message on nurse line requesting refill on lancets and insulin syringes at [facility].   Returned call. States she fell yesterday and hit her head on door sill. Denies loss of consciousness. Stated she had a headache yesterday. Denies pain today. Denies visual disturbances. Patient taking Xarelto and asa. Advised to go to ED for assessment. Patient states she will. Hubbard Hartshorn, RN, BSN

## 2017-07-02 NOTE — Progress Notes (Signed)
Addendum: I have reviewed this visit and discussed with Lauren Ducatte, RN, BSN, and agree with her documentation  Magdaleno Lortie, MD [facility], PGY-3  

## 2017-07-04 ENCOUNTER — Ambulatory Visit: Payer: Self-pay | Admitting: Acute Care

## 2017-07-04 ENCOUNTER — Other Ambulatory Visit: Payer: Self-pay | Admitting: Family Medicine

## 2017-07-04 NOTE — Telephone Encounter (Signed)
Called patient for clarification. She doesn't know which lancets or syringes she needs. We also do not have them on her med list. Asked her to please ask pharmacy to send [place] a refill request so I can get her the proper supplies. She was appreciative of the call.   Smitty Cords, MD [place], PGY-3

## 2017-07-04 NOTE — Progress Notes (Deleted)
History of Present Illness Tamara Henry is a 56 y.o. female former smoker ( quit 2007 with a 20 pack year smoking history) with OSA ,  CPAP treatment initiated 04/2017. She was followed by Dr. Corrie Dandy.   07/04/2017 Follow up: Pt presents for follow up. She was last seen 05/03/2016 for visit after initiation of CPAP treatment. At the time she had been on CPAP x 10 days. She was compliant with her CPAP, but had significant mask leaks and down Load at the August visit was : Download 04/23/2017 through 05/02/2017 Air sense 10 auto set 14 cm H2O-20 cm H2O Use 10 out of 10 days or 100% Median pressure 15 maximum pressure 18 Leaks 19.1 median 117.7 maximum AHI 21.5 (6.3 central,  3.0 obstructive,  11.4 un known) Cheyne-Stokes respiration 47 minutes or 10%( Significant cardiac history)   Plan after that visit was for a full month on current settings with a mask fitting by Lynnae Sandhoff at the sleep Center to evaluate improvement in AHI. Pt. Returns today for the follow up. She states she is doing well.     Test Results:  07/04/2017>>Down Load   01/31/2017>>  IMPRESSIONS  1. Severe obstructive sleep apnea occurred during the diagnostic portion of the study (AHI = 88.4/hour). Adequate PAP pressure was selected for this patient. 2. No significant central sleep apnea occurred during the diagnostic portion of the study (CAI = 1.4/hour). 3. Moderate oxygen desaturation was noted during the diagnostic portion of the study (Min O2 =77.00%). 4. The patient snored with Soft snoring volume during the diagnostic portion of the study. 5. No cardiac abnormalities were noted during this study. 6. Severe periodic limb movements of sleep occurred during the study.  Recommendations: 1. No optimal settings were obtained for this patient during this sleep study. She was adequate on CPAP 18 cm water but she did not have REM sleep. Suggest trial with auto CPAP therapy 14-20 cm H2O with a Medium size Resmed Full  Face Mask AirFit F20 mask and heated humidification. She will need a 1 month download on these settings. If she is not corrected an auto CPAP, she will need a BiPAP titration study.   CBC Latest Ref Rng & Units 06/18/2017 06/17/2017 02/19/2017  WBC 4.0 - 10.5 K/uL 9.1 8.0 9.4  Hemoglobin 12.0 - 15.0 g/dL 10.9(L) 12.5 11.5(L)  Hematocrit 36.0 - 46.0 % 36.4 41.2 38.0  Platelets 150 - 400 K/uL 233 267 298    BMP Latest Ref Rng & Units 06/17/2017 02/19/2017 12/15/2016  Glucose 65 - 99 mg/dL 75 252(H) 317(H)  BUN 6 - 20 mg/dL 18 30(H) 26(H)  Creatinine 0.44 - 1.00 mg/dL 1.37(H) 1.51(H) 1.42(H)  BUN/Creat Ratio 9 - 23 - - 18  Sodium 135 - 145 mmol/L 141 136 141  Potassium 3.5 - 5.1 mmol/L 4.1 4.0 3.8  Chloride 101 - 111 mmol/L 109 104 98  CO2 22 - 32 mmol/L 23 21(L) 24  Calcium 8.9 - 10.3 mg/dL 8.8(L) 9.2 8.6(L)    Dg Chest 2 View  Result Date: 06/17/2017 CLINICAL DATA:  56 year old female with right arm pain EXAM: CHEST  2 VIEW COMPARISON:  Prior chest x-ray 10/21/2016 FINDINGS: Cardiac and mediastinal contours are within normal limits. Patient is status post median sternotomy with evidence of prior multivessel CABG including LIMA bypass. The lungs are clear. Stable fractures of the superior most sternal wire. Osseous structures are intact and unremarkable. IMPRESSION: No active cardiopulmonary disease. Electronically Signed   By: Dellis Filbert.D.  On: 06/17/2017 13:30   Mr Lumbar Spine W/o Contrast  Result Date: 06/15/2017 CLINICAL DATA:  Chronic right-sided low back pain extending to the right leg. Right leg weakness. EXAM: MRI LUMBAR SPINE WITHOUT CONTRAST TECHNIQUE: Multiplanar, multisequence MR imaging of the lumbar spine was performed. No intravenous contrast was administered. COMPARISON:  Radiography 06/06/2017.  MRI 01/13/2015. FINDINGS: Segmentation:  5 lumbar type vertebral bodies. Alignment:  Normal Vertebrae:  Normal Conus medullaris: Extends to the L1 level and appears normal.  Paraspinal and other soft tissues: Negative Disc levels: No abnormality at T12-L1 or L1-2. L2-3: No disc abnormality. Bilateral facet and ligamentous hypertrophy. No compressive stenosis. L3-4: Disc degeneration with bilateral foraminal to extraforaminal protrusions. Facet and ligamentous hypertrophy. Mild central canal stenosis. Foraminal encroachment that would have some potential to affect either or both L3 nerves. This is more pronounced on the left. L4-5: Disc degeneration with bilateral foraminal to extraforaminal protrusions. Facet and ligamentous hypertrophy. Mild stenosis of the central canal and lateral recesses. Foraminal to extraforaminal encroachment would have some potential to affect either or both L4 nerves. L5-S1: Broad-based disc herniation more prominent towards the left. Facet and ligamentous hypertrophy. Narrowing of the subarticular lateral recesses left more than right. Left S1 nerve root compression could occur. Findings appear similar to the study of May 2016. IMPRESSION: L2-3:  Facet and ligamentous hypertrophy.  No compressive stenosis. L3-4: Bilateral foraminal to extraforaminal protrusions more prominent on the left. Facet and ligamentous hypertrophy. Multifactorial stenosis. Potential for compression of the exiting L3 nerves, particularly on the left. L4-5: Bilateral foraminal to extraforaminal protrusions. Facet and ligamentous hypertrophy. Mild canal and lateral recess stenosis. Potential for compression of the exiting L4 nerves. L5-S1: Left posterolateral prominent disc herniation. Subarticular lateral recess on the left that could compress the left S1 nerve. Electronically Signed   By: Nelson Chimes M.D.   On: 06/15/2017 14:21   Dg Humerus Right  Result Date: 06/17/2017 CLINICAL DATA:  Mid to distal right humerus pain. EXAM: RIGHT HUMERUS - 2+ VIEW COMPARISON:  None. FINDINGS: There is no evidence of fracture or other focal bone lesions. Soft tissues are unremarkable. IV catheter  noted in the antecubital fossa of the elbow. IMPRESSION: No acute fracture or malalignment of the right humerus. Electronically Signed   By: Ashley Royalty M.D.   On: 06/17/2017 17:45   Xr Knee 3 View Left  Result Date: 06/28/2017 Standing x-rays both knees lateral left knee sunrise patella both the x-rays are reviewed. This shows some vascular clips right calf from previous surgery. Mild to moderate osteoarthritis slightly worse on the right than left knee involving the medial compartment and patellofemoral compartment. Impression: Bilateral knee osteoarthritis, mild, slightly worse on the right than left knee. No acute changes.  Xr Lumbar Spine 2-3 Views  Result Date: 06/06/2017 X-rays lumbar spine show multilevel degenerative disc disease. No acute findings.    Past medical hx Past Medical History:  Diagnosis Date  . Acid reflux   . Anxiety   . Arthritis    back  . Asthma   . Back pain   . Bilateral pulmonary embolism ([place]) 05/2016  . Bipolar disorder ([place])   . CAD in native artery    S/P NSTEMI with cath showing severe 3 vessel ASCAD s/p CABGx4 07/28/16.  . Cataracts, bilateral   . CKD (chronic kidney disease), stage III (HCC)    borderline CKD II-III  . Cocaine abuse ([place])    In remission. Stopped using in early 2000's  . Depression   .  Diabetes mellitus ([place])    Type II  . GERD (gastroesophageal reflux disease)   . Glaucoma   . High cholesterol   . History of blood transfusion   . 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 Hx of chest tube placement right   . Hypertension   . Morbid obesity ([place])   . Myocardial infarction ([place])   . Neuropathy    hands and feet  . Pneumonia   . Postoperative anemia 07/2016  . PTSD (post-traumatic stress disorder)   . Stroke [facility]) 2005   no residual     Social History  Substance Use Topics  . Smoking status: Former Smoker    Packs/day: 2.00    Years: 10.00    Types: Cigarettes    Quit date: 09/11/2005  . Smokeless tobacco: Never Used  . Alcohol  use No    Ms.Herter reports that she quit smoking about 11 years ago. Her smoking use included Cigarettes. She has a 20.00 pack-year smoking history. She has never used smokeless tobacco. She reports that she does not drink alcohol or use drugs.  Tobacco Cessation: former smoker ( quit 2007 with a 20 pack year smoking history)  Past surgical hx, Family hx, Social hx all reviewed.  Current Outpatient Prescriptions on File Prior to Visit  Medication Sig  . amLODipine (NORVASC) 10 MG tablet Take 1 tablet (10 mg total) by mouth daily.  Marland Kitchen aspirin 81 MG EC tablet Take 1 tablet (81 mg total) by mouth daily.  Marland Kitchen atorvastatin (LIPITOR) 80 MG tablet Take 1 tablet (80 mg total) by mouth every evening.  . citalopram (CELEXA) 20 MG tablet Take 1 tablet (20 mg total) by mouth daily.  . ferrous sulfate 325 (65 FE) MG tablet Take 1 tablet (325 mg total) by mouth daily with breakfast.  . folic acid (FOLVITE) 644 MCG tablet Take 400 mcg by mouth daily.  . furosemide (LASIX) 40 MG tablet Take 1 tablet (40 mg total) by mouth daily.  Marland Kitchen gabapentin (NEURONTIN) 300 MG capsule Take 900 mg by mouth in the morning, 600 mg at lunch, and 900 mg at bedtime  . HYDROcodone-acetaminophen (NORCO/VICODIN) 5-325 MG tablet Take 1 tablet by mouth 3 (three) times daily as needed for moderate pain. (Patient not taking: Reported on 06/27/2017)  . insulin aspart (NOVOLOG) 100 UNIT/ML injection Inject 5 Units into the skin 2 (two) times daily before a meal.  . Insulin Glargine (BASAGLAR KWIKPEN) 100 UNIT/ML SOPN INJECT 50 UNITS INTO SKIN EACH NIGHT AT BEDTIME  . loperamide (IMODIUM) 2 MG capsule Take 1 capsule (2 mg total) by mouth daily as needed for diarrhea or loose stools.  . Melatonin 3 MG TABS Take 6 mg by mouth at bedtime.  . metoprolol tartrate (LOPRESSOR) 25 MG tablet Take 1 tablet (25 mg total) by mouth 2 (two) times daily.  . Multiple Vitamins-Minerals (MULTIVITAMIN) tablet Take 1 tablet by mouth daily.  Marland Kitchen omeprazole  (PRILOSEC) 40 MG capsule Take 1 capsule (40 mg total) by mouth daily.  . potassium chloride SA (K-DUR,KLOR-CON) 20 MEQ tablet take half a TABLET BY MOUTH EVERY MORNING (Patient taking differently: Take 79meq by mouthEVERY MORNING)  . Probiotic Product (ALIGN) 4 MG CAPS Take 1 capsule (4 mg total) by mouth daily.  . rivaroxaban (XARELTO) 10 MG TABS tablet Take 1 tablet (10 mg total) by mouth every morning.  Marland Kitchen tiZANidine (ZANAFLEX) 4 MG tablet TAKE 1 TABLET BY MOUTH EVERY 8 HOURS AS NEEDED FOR MUSCLE SPASMS   No current facility-administered medications  on file prior to visit.      Allergies  Allergen Reactions  . Lactose Intolerance (Gi) Diarrhea  . Lisinopril Other (See Comments) and Cough    Inflammation, coughing  . Reglan [Metoclopramide] Other (See Comments)    REACTION IS SIDE EFFECT Pt stated having lock jaw as a side effect  . Wellbutrin [Bupropion] Nausea And Vomiting and Cough  . Latex Rash  . Metformin And Related Diarrhea  . Penicillins Rash    [FROM PREVIOUS ENTRY-BEFORE 07/27/16] >>"ALL CILLINS"  PATIENT HAD A PCN REACTION WITH IMMEDIATE RASH, FACIAL/TONGUE/THROAT SWELLING, SOB, OR LIGHTHEADEDNESS WITH HYPOTENSION:  #  #  #  YES  #  #  #   Has patient had a PCN reaction causing severe rash involving mucus membranes or skin necrosis: No Has patient had a PCN reaction that required hospitalization No Has patient had a PCN reaction occurring within the last 10 years:   #  #  #  YES  #  #  #   . Sulfa Antibiotics Itching  . Tape Rash    Review Of Systems:  Constitutional:   No  weight loss, night sweats,  Fevers, chills, fatigue, or  lassitude.  HEENT:   No headaches,  Difficulty swallowing,  Tooth/dental problems, or  Sore throat,                No sneezing, itching, ear ache, nasal congestion, post nasal drip,   CV:  No chest pain,  Orthopnea, PND, swelling in lower extremities, anasarca, dizziness, palpitations, syncope.   GI  No heartburn, indigestion, abdominal  pain, nausea, vomiting, diarrhea, change in bowel habits, loss of appetite, bloody stools.   Resp: No shortness of breath with exertion or at rest.  No excess mucus, no productive cough,  No non-productive cough,  No coughing up of blood.  No change in color of mucus.  No wheezing.  No chest wall deformity  Skin: no rash or lesions.  GU: no dysuria, change in color of urine, no urgency or frequency.  No flank pain, no hematuria   MS:  No joint pain or swelling.  No decreased range of motion.  No back pain.  Psych:  No change in mood or affect. No depression or anxiety.  No memory loss.   Vital Signs There were no vitals taken for this visit.   Physical Exam:  General- No distress,  A&Ox3, pleasant ENT: No sinus tenderness, TM clear, pale nasal mucosa, no oral exudate,no post nasal drip, no LAN Cardiac: S1, S2, regular rate and rhythm, no murmur Chest: No wheeze/ rales/ dullness; no accessory muscle use, no nasal flaring, no sternal retractions Abd.: Soft Non-tender, obese Ext: No clubbing cyanosis, edema Neuro:  normal strength, cranial nerves intact, appropriate Skin: No rashes, warm and dry Psych: normal mood and behavior   Assessment/Plan  No problem-specific Assessment & Plan notes found for this encounter.    Magdalen Spatz, NP 07/04/2017  11:53 AM

## 2017-07-06 ENCOUNTER — Other Ambulatory Visit: Payer: Self-pay | Admitting: Family Medicine

## 2017-07-06 ENCOUNTER — Other Ambulatory Visit (INDEPENDENT_AMBULATORY_CARE_PROVIDER_SITE_OTHER): Payer: Self-pay | Admitting: Surgery

## 2017-07-06 NOTE — Telephone Encounter (Signed)
[place] patient

## 2017-07-06 NOTE — Telephone Encounter (Signed)
This is not [place]. This is LMD, we are treating her for Hx of PE. She is not coming back for 6 months. Ask pharmacy to send to her primary care

## 2017-07-06 NOTE — Telephone Encounter (Signed)
Ok for refill? 

## 2017-07-09 ENCOUNTER — Other Ambulatory Visit: Payer: Self-pay | Admitting: Family Medicine

## 2017-07-15 DIAGNOSIS — R269 Unspecified abnormalities of gait and mobility: Secondary | ICD-10-CM | POA: Diagnosis not present

## 2017-07-15 DIAGNOSIS — G4733 Obstructive sleep apnea (adult) (pediatric): Secondary | ICD-10-CM | POA: Diagnosis not present

## 2017-07-19 ENCOUNTER — Telehealth: Payer: Self-pay | Admitting: *Deleted

## 2017-07-19 DIAGNOSIS — E113511 Type 2 diabetes mellitus with proliferative diabetic retinopathy with macular edema, right eye: Secondary | ICD-10-CM | POA: Diagnosis not present

## 2017-07-19 DIAGNOSIS — E113512 Type 2 diabetes mellitus with proliferative diabetic retinopathy with macular edema, left eye: Secondary | ICD-10-CM | POA: Diagnosis not present

## 2017-07-19 DIAGNOSIS — E113513 Type 2 diabetes mellitus with proliferative diabetic retinopathy with macular edema, bilateral: Secondary | ICD-10-CM | POA: Diagnosis not present

## 2017-07-19 NOTE — Telephone Encounter (Signed)
Patient left message on nurse stating that her insurance no longer covers methocarbamol and basaglar insulin. Wants to speak with MD regarding possible alternatives.

## 2017-07-22 NOTE — Progress Notes (Signed)

## 2017-07-24 MED ORDER — INSULIN GLARGINE 100 UNIT/ML SOLOSTAR PEN
50.0000 [IU] | PEN_INJECTOR | Freq: Every day | SUBCUTANEOUS | 99 refills | Status: DC
Start: 2017-07-24 — End: 2017-12-25

## 2017-07-24 NOTE — Telephone Encounter (Signed)
Called Tamara Henry back, no answer. Left voicemail. Will try to reach her at a later time.   Smitty Cords, MD [place], PGY-3

## 2017-07-24 NOTE — Telephone Encounter (Signed)
Called patient again and was able to get in touch with her. She states insulin glargine will no longer be covered. Called her pharmacy and they stated that Lantus should be covered by Medicare. Prescription sent over for Lantus.   Of note, she is not taking methocarbamol anymore, it is an old medication.   Patient also endorsed to me she is having black stools. Asked her to please schedule an appointment with me to discuss this further.   Smitty Cords, MD [place], PGY-3

## 2017-07-25 ENCOUNTER — Ambulatory Visit (INDEPENDENT_AMBULATORY_CARE_PROVIDER_SITE_OTHER): Payer: Medicare Other | Admitting: Licensed Clinical Social Worker

## 2017-07-25 DIAGNOSIS — R4589 Other symptoms and signs involving emotional state: Secondary | ICD-10-CM

## 2017-07-25 DIAGNOSIS — F329 Major depressive disorder, single episode, unspecified: Secondary | ICD-10-CM

## 2017-07-25 NOTE — Progress Notes (Signed)
Total time:30 minutes Type of Service: Integrated Behavioral Health F/U Interpretor:No.   SUBJECTIVE: Tamara Henry is a 56 y.o. female referred by Dr. Juanito Doom.       Reason for follow-up: Continue brief intervention to assist patient with managing symptoms of anxiety and depression,and family stressors. Reports somewhat difficult with impairment in daily functions. Patient is pleasant and engaged in conversation. Appearance:Neat ; Thought process: Coherent; Affect: Appropriate . Risk of harm to self or others: No plan to harm self or others  PHQ 9=13, indication of: moderate depression. GAD-7=12, indication of: moderate anxiety. Patient states she is feeling a little better. This is also evident by slight decrease in PHQ-9/GAD score.  GOALS: Patient will reduce symptoms of: anxiety, depression and stress , and increase  ability PR:XYVOPF skills, self-management skills and stress reduction. Intervention: Motivational Interviewing, Solution-Focused Strategies and Supportive Counseling, Behavioral Therapy (Relaxed breathing), Referral to Counselor/Psychotherapist; resources for budgeting classes and psycho education.  Issues discussed: Update on recent vacation with family, ongoing therapy needs ; concerns with family and making tough love decisions ; Going to [employer] several times per week, being comfortable saying no, Relaxed breathing, and F/U with PCP.   ASSESSMENT:Patient continues to experience symptoms of depression and anxiety.  Symptoms exacerbated by family stressors and hx of trauma.  Patient may benefit from, and is in agreement to continue further assessment and brief therapeutic interventions to assist with managing her symptoms until she is connected with ongoing therapy. Patient will contact [facility].  PLAN: 1. Patient will F/U with LCSW in two weeks 2. Behavioral recommendations: relaxed breathing, behavioral activation  3. Referral: Psychiatrist and  Counselor, list provided, patient will make calls.   4. Patient will also f/u on legal counsel for personal concerns about her car.  Casimer Lanius, LCSW Licensed Clinical Social Worker [facility]   [phone] 12:02 PM

## 2017-07-28 ENCOUNTER — Emergency Department (HOSPITAL_COMMUNITY)
Admission: EM | Admit: 2017-07-28 | Discharge: 2017-07-28 | Disposition: A | Discharge status: Home / Self Care | Payer: Medicare Other | Attending: Physician Assistant | Admitting: Physician Assistant

## 2017-07-28 ENCOUNTER — Encounter (HOSPITAL_COMMUNITY): Payer: Self-pay | Admitting: Emergency Medicine

## 2017-07-28 ENCOUNTER — Emergency Department (HOSPITAL_COMMUNITY): Payer: Medicare Other

## 2017-07-28 ENCOUNTER — Other Ambulatory Visit: Payer: Self-pay

## 2017-07-28 DIAGNOSIS — R05 Cough: Secondary | ICD-10-CM | POA: Diagnosis not present

## 2017-07-28 DIAGNOSIS — Z79899 Other long term (current) drug therapy: Secondary | ICD-10-CM | POA: Diagnosis not present

## 2017-07-28 DIAGNOSIS — Z7982 Long term (current) use of aspirin: Secondary | ICD-10-CM | POA: Diagnosis not present

## 2017-07-28 DIAGNOSIS — R195 Other fecal abnormalities: Secondary | ICD-10-CM | POA: Insufficient documentation

## 2017-07-28 DIAGNOSIS — I129 Hypertensive chronic kidney disease with stage 1 through stage 4 chronic kidney disease, or unspecified chronic kidney disease: Secondary | ICD-10-CM | POA: Diagnosis not present

## 2017-07-28 DIAGNOSIS — N183 Chronic kidney disease, stage 3 (moderate): Secondary | ICD-10-CM | POA: Diagnosis not present

## 2017-07-28 DIAGNOSIS — Z794 Long term (current) use of insulin: Secondary | ICD-10-CM | POA: Insufficient documentation

## 2017-07-28 DIAGNOSIS — F319 Bipolar disorder, unspecified: Secondary | ICD-10-CM | POA: Insufficient documentation

## 2017-07-28 DIAGNOSIS — E1122 Type 2 diabetes mellitus with diabetic chronic kidney disease: Secondary | ICD-10-CM | POA: Diagnosis not present

## 2017-07-28 DIAGNOSIS — J45909 Unspecified asthma, uncomplicated: Secondary | ICD-10-CM | POA: Diagnosis not present

## 2017-07-28 DIAGNOSIS — Z91040 Latex allergy status: Secondary | ICD-10-CM | POA: Insufficient documentation

## 2017-07-28 DIAGNOSIS — Z87891 Personal history of nicotine dependence: Secondary | ICD-10-CM | POA: Diagnosis not present

## 2017-07-28 DIAGNOSIS — R197 Diarrhea, unspecified: Secondary | ICD-10-CM | POA: Insufficient documentation

## 2017-07-28 DIAGNOSIS — I2510 Atherosclerotic heart disease of native coronary artery without angina pectoris: Secondary | ICD-10-CM | POA: Insufficient documentation

## 2017-07-28 DIAGNOSIS — F1410 Cocaine abuse, uncomplicated: Secondary | ICD-10-CM | POA: Insufficient documentation

## 2017-07-28 DIAGNOSIS — Z951 Presence of aortocoronary bypass graft: Secondary | ICD-10-CM | POA: Insufficient documentation

## 2017-07-28 DIAGNOSIS — F419 Anxiety disorder, unspecified: Secondary | ICD-10-CM | POA: Insufficient documentation

## 2017-07-28 DIAGNOSIS — I252 Old myocardial infarction: Secondary | ICD-10-CM | POA: Insufficient documentation

## 2017-07-28 DIAGNOSIS — Z8673 Personal history of transient ischemic attack (TIA), and cerebral infarction without residual deficits: Secondary | ICD-10-CM | POA: Diagnosis not present

## 2017-07-28 DIAGNOSIS — Z7901 Long term (current) use of anticoagulants: Secondary | ICD-10-CM | POA: Diagnosis not present

## 2017-07-28 LAB — COMPREHENSIVE METABOLIC PANEL
ALBUMIN: 2.9 g/dL — AB (ref 3.5–5.0)
ALT: 25 U/L (ref 14–54)
AST: 26 U/L (ref 15–41)
Alkaline Phosphatase: 149 U/L — ABNORMAL HIGH (ref 38–126)
Anion gap: 5 (ref 5–15)
BUN: 20 mg/dL (ref 6–20)
CHLORIDE: 110 mmol/L (ref 101–111)
CO2: 23 mmol/L (ref 22–32)
Calcium: 8.5 mg/dL — ABNORMAL LOW (ref 8.9–10.3)
Creatinine, Ser: 1.52 mg/dL — ABNORMAL HIGH (ref 0.44–1.00)
GFR calc Af Amer: 44 mL/min — ABNORMAL LOW (ref >60)
GFR, EST NON AFRICAN AMERICAN: 38 mL/min — AB (ref >60)
Glucose, Bld: 187 mg/dL — ABNORMAL HIGH (ref 65–99)
POTASSIUM: 3.8 mmol/L (ref 3.5–5.1)
Sodium: 138 mmol/L (ref 135–145)
Total Bilirubin: 0.3 mg/dL (ref 0.3–1.2)
Total Protein: 6.7 g/dL (ref 6.5–8.1)

## 2017-07-28 LAB — TYPE AND SCREEN
ABO/RH(D): B POS
Antibody Screen: NEGATIVE

## 2017-07-28 LAB — CBC
HEMATOCRIT: 38.2 % (ref 36.0–46.0)
Hemoglobin: 11.7 g/dL — ABNORMAL LOW (ref 12.0–15.0)
MCH: 27.3 pg (ref 26.0–34.0)
MCHC: 30.6 g/dL (ref 30.0–36.0)
MCV: 89 fL (ref 78.0–100.0)
Platelets: 172 10*3/uL (ref 150–400)
RBC: 4.29 MIL/uL (ref 3.87–5.11)
RDW: 16.9 % — AB (ref 11.5–15.5)
WBC: 7.5 10*3/uL (ref 4.0–10.5)

## 2017-07-28 LAB — POC OCCULT BLOOD, ED: FECAL OCCULT BLD: POSITIVE — AB

## 2017-07-28 MED ORDER — GUAIFENESIN ER 600 MG PO TB12: 1200.0000 mg | ORAL_TABLET | Freq: Two times a day (BID) | ORAL | 0 refills | Status: DC

## 2017-07-28 MED ORDER — DIPHENOXYLATE-ATROPINE 2.5-0.025 MG PO TABS: 1.0000 | ORAL_TABLET | Freq: Four times a day (QID) | ORAL | 0 refills | Status: DC | PRN

## 2017-07-28 MED ORDER — ALBUTEROL SULFATE HFA 108 (90 BASE) MCG/ACT IN AERS
2.0000 | INHALATION_SPRAY | Freq: Once | RESPIRATORY_TRACT | Status: AC
  Administered 2017-07-28: 2 via RESPIRATORY_TRACT
  Filled 2017-07-28: qty 6.7

## 2017-07-28 NOTE — ED Provider Notes (Signed)
[facility] EMERGENCY DEPARTMENT Provider Note   CSN: [id number] Arrival date & time: 07/28/17  1616     History   Chief Complaint Chief Complaint  Patient presents with  . Melena    HPI Tamara Henry is a 56 y.o. female.  HPI  Patient is a 56 y.o. female with a history of CAD (status post non-STEMI), CABG, CKD, diabetes mellitus, presenting for diarrhea and black stools.  Patient reports that she has had dark stools for 3 weeks since she went on vacation to [place].  Patient reports she will have approximately 6 watery stools per day.  Patient has a history of many episodes of diarrhea per day for multiple years, but has never seen a gastroenterologist regarding this.  No bright red blood per rectum.  Patient does take iron, and has for 2-3 years.  Patient has not been taking Pepto-Bismol.  Patient denies any abdominal pain or cramping.  Patient denies emesis.  Patient denies any palpitations, dizziness upon standing, shortness of breath, or chest pain.  No other contacts with similar symptoms.  No treatments attempted for symptoms.  Patient was instructed by the nurse on call line at her PCP to report to the emergency department due to melenic stools.  Last colonoscopy at least 2 years ago.  As a secondary issue, patient also reports that she has had a cough for 2 days.  Patient denies any sputum production or hemoptysis.  No fevers or chills.  No increasing lower extremity edema.  Past Medical History:  Diagnosis Date  . Acid reflux   . Anxiety   . Arthritis    back  . Asthma   . Back pain   . Bilateral pulmonary embolism ([place]) 05/2016  . Bipolar disorder ([place])   . CAD in native artery    S/P NSTEMI with cath showing severe 3 vessel ASCAD s/p CABGx4 07/28/16.  . Cataracts, bilateral   . CKD (chronic kidney disease), stage III (HCC)    borderline CKD II-III  . Cocaine abuse ([place])    In remission. Stopped using in early 2000's  . Depression   .  Diabetes mellitus ([place])    Type II  . GERD (gastroesophageal reflux disease)   . Glaucoma   . High cholesterol   . History of blood transfusion   . 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 Hx of chest tube placement right   . Hypertension   . Morbid obesity ([place])   . Myocardial infarction ([place])   . Neuropathy    hands and feet  . Pneumonia   . Postoperative anemia 07/2016  . PTSD (post-traumatic stress disorder)   . Stroke [facility]) 2005   no residual    Patient Active Problem List   Diagnosis Date Noted  . Arm pain, anterior, right 06/17/2017  . OSA on CPAP 05/03/2017  . Morbid obesity due to excess calories ([place]) 05/03/2017  . Vaginal discharge 03/27/2017  . Microscopic hematuria 03/27/2017  . Impingement syndrome of left shoulder 02/19/2017  . Hypersomnia 12/12/2016  . REM behavioral disorder 12/12/2016  . Asthma 12/12/2016  . Flatulence 11/17/2016  . Neck pain 11/17/2016  . CKD (chronic kidney disease), stage III ([place]) 11/02/2016  . At risk for sleep apnea 11/01/2016  . CAD (coronary artery disease), native coronary artery 10/26/2016  . Encounter for post surgical wound check 10/23/2016  . Poor social situation 10/13/2016  . Anemia 08/30/2016  . Leg pain 08/30/2016  . Bilateral low back pain without sciatica 08/30/2016  .  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 Bilateral pulmonary embolism ([place]) 05/17/2016  . Hyperlipidemia 05/17/2016  . Depression 05/17/2016  . Diabetic peripheral neuropathy ([place]) 05/17/2016  . Neuritis of upper extremity, C7 01/15/2015  . DM type 2, uncontrolled, with renal complications ([place]) 09/81/1914  . Hypertension 01/14/2015  . Numbness and tingling of right arm 01/14/2015    Past Surgical History:  Procedure Laterality Date  . COLONOSCOPY W/ POLYPECTOMY    . CORONARY ARTERY BYPASS GRAFTING (CABG)x4 using left internal mammary and endoscopic harvest of right greater saphenous vein N/A 07/28/2016   Performed by Ivin Poot, MD at [facility]  . DENTAL SURGERY     to  removed broken teeth  . Left Heart Cath and Coronary Angiography N/A 07/25/2016   Performed by Martinique, Peter M, MD at [facility]  . LEFT SHOULDER ARTHROSCOPY with debridement andROTATOR CUFF REPAIR Left 02/19/2017   Performed by [name], MD at [place]  . TRANSESOPHAGEAL ECHOCARDIOGRAM (TEE) N/A 07/28/2016   Performed by Ivin Poot, MD at [place]  . TUBAL LIGATION      OB History    Gravida Para Term Preterm AB Living   5 4 4   1 4    SAB TAB Ectopic Multiple Live Births     1             Home Medications    Prior to Admission medications   Medication Sig Start Date End Date Taking? Authorizing Provider  amLODipine (NORVASC) 10 MG tablet Take 1 tablet (10 mg total) by mouth daily. 06/04/17  Yes Carlyle Dolly, MD  aspirin 81 MG EC tablet Take 1 tablet (81 mg total) by mouth daily. 10/26/16  Yes Turner, Eber Hong, MD  atorvastatin (LIPITOR) 80 MG tablet Take 1 tablet (80 mg total) by mouth every evening. 01/18/17 07/28/17 Yes Carlyle Dolly, MD  calcipotriene (DOVONOX) 0.005 % cream Apply 1 application as needed topically. 07/26/17  Yes [provider]  Cholecalciferol (VITAMIN D-1000 MAX ST) 1000 units tablet Take 1,000 Units daily by mouth.   Yes [provider]  citalopram (CELEXA) 20 MG tablet Take 1 tablet (20 mg total) by mouth daily. 06/04/17  Yes Carlyle Dolly, MD  ezetimibe (ZETIA) 10 MG tablet Take 10 mg daily by mouth. 05/02/17  Yes [provider]  ferrous sulfate 325 (65 FE) MG tablet Take 1 tablet (325 mg total) by mouth daily with breakfast. 01/18/17  Yes Gambino, Arlie Solomons, MD  folic acid (FOLVITE) 782 MCG tablet Take 400 mcg by mouth daily.   Yes [provider]  furosemide (LASIX) 40 MG tablet Take 1 tablet (40 mg total) by mouth daily. 05/30/17  Yes Carlyle Dolly, MD  gabapentin (NEURONTIN) 300 MG capsule Take 900 mg by mouth in the morning, 600 mg at lunch, and 900 mg at bedtime 05/30/17  Yes  Carlyle Dolly, MD  hydrochlorothiazide (MICROZIDE) 12.5 MG capsule Take 12.5 mg daily by mouth. 06/26/16  Yes [provider]  insulin aspart (NOVOLOG) 100 UNIT/ML injection Inject 5 Units into the skin 2 (two) times daily before a meal. 02/09/17  Yes Gambino, Arlie Solomons, MD  Insulin Glargine (LANTUS SOLOSTAR) 100 UNIT/ML Solostar Pen Inject 50 Units daily at 10 pm into the skin. 07/24/17  Yes Carlyle Dolly, MD  loperamide (IMODIUM) 2 MG capsule Take 1 capsule (2 mg total) by mouth daily as needed for diarrhea or loose stools. 05/30/17  Yes Gambino,  Arlie Solomons, MD  Melatonin 3 MG TABS Take 6 mg by mouth at bedtime.   Yes [provider]  metoprolol tartrate (LOPRESSOR) 25 MG tablet Take 1 tablet (25 mg total) by mouth 2 (two) times daily. 06/04/17  Yes Carlyle Dolly, MD  Multiple Vitamins-Minerals (MULTIVITAMIN) tablet Take 1 tablet by mouth daily. 01/18/17  Yes Carlyle Dolly, MD  omeprazole (PRILOSEC) 40 MG capsule Take 1 capsule (40 mg total) by mouth daily. 05/30/17  Yes Carlyle Dolly, MD  potassium chloride SA (K-DUR,KLOR-CON) 20 MEQ tablet TAKE 1/2 TABLET BY MOUTH ONCE DAILY Patient taking differently: TAKE 10 meq TABLET BY MOUTH ONCE DAILY 07/06/17  Yes Carlyle Dolly, MD  Probiotic Product (ALIGN) 4 MG CAPS Take 1 capsule (4 mg total) by mouth daily. 01/18/17  Yes Carlyle Dolly, MD  tiZANidine (ZANAFLEX) 4 MG tablet TAKE 1 TABLET BY MOUTH EVERY 8 HOURS AS NEEDED FOR MUSCLE SPASMS 06/27/17  Yes Verner Mould, MD  XARELTO 10 MG TABS tablet Take 1 tablet (10 mg total) by mouth every morning 07/04/17  Yes Gambino, Arlie Solomons, MD  HYDROcodone-acetaminophen (NORCO/VICODIN) 5-325 MG tablet Take 1 tablet by mouth 3 (three) times daily as needed for moderate pain. Patient not taking: Reported on 06/27/2017 04/16/17   Suzan Slick, NP    Family History Family History  Problem Relation Age of Onset  . Pancreatitis Mother   .  Diabetes type II Mother   . Diabetes Mother   . CAD Father   . Heart attack Father   . Diabetes type II Sister   . Diabetes Sister   . Diabetes type II Brother   . Diabetes Brother   . Diabetes Brother   . Diabetes Brother     Social History Social History   Tobacco Use  . Smoking status: Former Smoker    Packs/day: 2.00    Years: 10.00    Pack years: 20.00    Types: Cigarettes    Last attempt to quit: 09/11/2005    Years since quitting: 11.8  . Smokeless tobacco: Never Used  Substance Use Topics  . Alcohol use: No  . Drug use: No     Allergies   Lactose intolerance (gi); Lisinopril; Reglan [metoclopramide]; Wellbutrin [bupropion]; Latex; Metformin and related; Penicillins; Sulfa antibiotics; and Tape   Review of Systems Review of Systems  Constitutional: Negative for chills and fever.  HENT: Negative for congestion, rhinorrhea and sore throat.   Respiratory: Positive for cough. Negative for chest tightness and shortness of breath.   Cardiovascular: Negative for chest pain, palpitations and leg swelling.  Gastrointestinal: Positive for diarrhea. Negative for abdominal pain, blood in stool, constipation, nausea and vomiting.  Genitourinary: Negative for dysuria and flank pain.  Musculoskeletal: Negative for back pain and myalgias.  Skin: Negative for rash.  Neurological: Negative for dizziness, syncope, light-headedness and headaches.      Physical Exam Updated Vital Signs BP (!) 142/73   Pulse (!) 111   Temp 98.1 F (36.7 C) (Oral)   Resp 18   SpO2 97%   Physical Exam  Constitutional: She appears well-developed and well-nourished. No distress.  HENT:  Head: Normocephalic and atraumatic.  Mouth/Throat: Oropharynx is clear and moist.  Eyes: Conjunctivae and EOM are normal. Pupils are equal, round, and reactive to light.  Neck: Normal range of motion. Neck supple.  Cardiovascular: Normal rate, regular rhythm, S1 normal and S2 normal.  No murmur  heard. Pulmonary/Chest: Effort normal. No respiratory distress. She  has no rales.  Coarse lung sounds in bilateral lung bases.  Abdominal: Soft. She exhibits no distension. There is no tenderness. There is no guarding.  Genitourinary:  Genitourinary Comments: Exam performed with nurse chaperone (Perpedue) present.  No external lesions around the rectum.  There is one atrophied prolapsed internal hemorrhoid at the 9 o'clock position.  Normal rectal tone. No thrombosed external hemorrhoids.  There are no masses in rectal vault.  Loose stool present in rectal vault.  Multiple internal hemorrhoids palpated.  Musculoskeletal: Normal range of motion. She exhibits no edema or deformity.  Lymphadenopathy:    She has no cervical adenopathy.  Neurological: She is alert.  Patient was extremities symmetrically and with good coordination.Cranial nerves grossly intact.  Skin: Skin is warm and dry. No rash noted. No erythema.  Psychiatric: She has a normal mood and affect. Her behavior is normal. Judgment and thought content normal.  Nursing note and vitals reviewed.    ED Treatments / Results  Labs (all labs ordered are listed, but only abnormal results are displayed) Labs Reviewed  COMPREHENSIVE METABOLIC PANEL - Abnormal; Notable for the following components:      Result Value   Glucose, Bld 187 (*)    Creatinine, Ser 1.52 (*)    Calcium 8.5 (*)    Albumin 2.9 (*)    Alkaline Phosphatase 149 (*)    GFR calc non Af Amer 38 (*)    GFR calc Af Amer 44 (*)    All other components within normal limits  CBC - Abnormal; Notable for the following components:   Hemoglobin 11.7 (*)    RDW 16.9 (*)    All other components within normal limits  POC OCCULT BLOOD, ED - Abnormal; Notable for the following components:   Fecal Occult Bld POSITIVE (*)    All other components within normal limits  GASTROINTESTINAL PANEL BY PCR, STOOL (REPLACES STOOL CULTURE)  TYPE AND SCREEN    EKG  EKG  Interpretation None       Radiology Dg Chest 2 View  Result Date: 07/28/2017 CLINICAL DATA:  Productive cough for several days EXAM: CHEST  2 VIEW COMPARISON:  06/17/2017 FINDINGS: Cardiac shadow is within normal limits. The lungs are well aerated bilaterally. No effusion or focal infiltrate is seen. Postsurgical changes are noted. No acute bony abnormality is seen. IMPRESSION: No active cardiopulmonary disease. Electronically Signed   By: Inez Catalina M.D.   On: 07/28/2017 20:30    Procedures Procedures (including critical care time)  Medications Ordered in ED Medications - No data to display   Initial Impression / Assessment and Plan / ED Course  I have reviewed the triage vital signs and the nursing notes.  Pertinent labs & imaging results that were available during my care of the patient were reviewed by me and considered in my medical decision making (see chart for details).     Final Clinical Impressions(s) / ED Diagnoses   Final diagnoses:  Diarrhea, unspecified type  Stool guaiac positive   Patient is nontoxic-appearing and in no acute distress.  Patient is not tachycardic and she is normotensive.  Patient has chronic diarrhea, quality and quantity are largely unchanged.  Hemoccult is positive today.  Dark stools may be due to GI bleed versus transit time with iron therapy. Patient demonstrates no laboratory evidence of rapid GI bleed over the three weeks of noting darker stools.  Hemoglobin is consistent with patient's prior baseline.  BUN is not elevated.  Renal function is per  patient's baseline CKD  Patient is deemed stable for GI follow-up for hemocult positive stool at this time.  Patient prescribed Lomotil for symptoms.  Patient also exhibits a nonproductive cough today.  Chest x-ray, reviewed by me was negative for acute infiltrate.  Possible pulmonary edema noted in lung bases, but patient is on appropriate furosemide therapy and is not short of breath. I suspect  this is viral bronchitis.  Patient treated symptomatically with Mucinex, inhaler.  I discussed return precautions such as bright red blood or clots per rectum, dizziness, lightheadedness or presyncope upon standing, chest pain or shortness of breath, or palpitations.  Patient is in understanding and agrees with the plan of care.  Hemoglobin  Date Value Ref Range Status  07/28/2017 11.7 (L) 12.0 - 15.0 g/dL Final  06/18/2017 10.9 (L) 12.0 - 15.0 g/dL Final  06/17/2017 12.5 12.0 - 15.0 g/dL Final  02/19/2017 11.5 (L) 12.0 - 15.0 g/dL Final    This is a shared visit with Dr. Zenovia Jarred. Patient was independently evaluated by this attending physician. Attending physician consulted in evaluation and discharge management.   ED Discharge Orders    None       Tamala Julian 07/29/17 0131    Macarthur Critchley, MD 07/29/17 2245

## 2017-07-28 NOTE — Discharge Instructions (Signed)
Please see the information and instructions below regarding your visit.  Your diagnoses today include:  1. Diarrhea, unspecified type   2. Stool guaiac positive    Your lab work is reassuring today.  Your hemoglobin has remained stable.  Tests performed today include: See side panel of your discharge paperwork for testing performed today. Vital signs are listed at the bottom of these instructions.   Chest x-ray, Hemoccult to check for blood in the stool.  Medications prescribed:    Take any prescribed medications only as prescribed, and any over the counter medications only as directed on the packaging.  Lomotil.  You may take this up to 4 times daily.  You may take 1-2 pills in each administration. Please start with one pill and then increase to 2 if needed.  Home care instructions:  Please follow any educational materials contained in this packet.   Follow-up instructions: Please follow-up with your primary care provider in one week for further evaluation of your symptoms if they are not completely improved.   Please follow up with gastroenterology as soon as possible.  Return instructions:  Please return to the Emergency Department if you experience worsening symptoms.  Please return the emergency department if you have any increasing shortness of breath, chest pain, dizziness or lightheadedness upon standing, palpitations, or you noticed bright red blood from the rectum. Please return if you have any other emergent concerns.  Additional Information:   Your vital signs today were: BP (!) 142/73    Pulse (!) 111    Temp 98.1 F (36.7 C) (Oral)    Resp 18    SpO2 97%  If your blood pressure (BP) was elevated on multiple readings during this visit above 130 for the top number or above 80 for the bottom number, please have this repeated by your primary care provider within one month. --------------  Thank you for allowing [place] to participate in your care today.

## 2017-07-28 NOTE — ED Notes (Signed)
Pt came to desk stating that she was on a "prayer call; this is very important", and that if her name was called, she would be outside

## 2017-07-28 NOTE — ED Notes (Signed)
Pt given PO fluids & bag lunch per EDP verbal order. Tolerating everything well. Showing NAD. RR even and unlabored.

## 2017-07-28 NOTE — ED Triage Notes (Signed)
Pt presents to ED for assessment of three weeks of black diarrhea.  Pt is currently on iron pills.  Pt also on Xarelto.  Pt denies hx of same.  Denies abdominal pain.  Denies chest pain or SOB.  Denies light-headedness or sensitivity to cold.  Pt c/o 4 diarrhea bowel movements since rising today.

## 2017-07-30 ENCOUNTER — Ambulatory Visit (INDEPENDENT_AMBULATORY_CARE_PROVIDER_SITE_OTHER): Payer: Medicare Other | Admitting: Family Medicine

## 2017-07-30 ENCOUNTER — Other Ambulatory Visit: Payer: Self-pay

## 2017-07-30 ENCOUNTER — Ambulatory Visit: Payer: Self-pay | Admitting: Family Medicine

## 2017-07-30 ENCOUNTER — Encounter: Payer: Self-pay | Admitting: Family Medicine

## 2017-07-30 VITALS — BP 130/76 | HR 92 | Temp 98.4°F | Ht 64.0 in | Wt 239.4 lb

## 2017-07-30 DIAGNOSIS — R197 Diarrhea, unspecified: Secondary | ICD-10-CM

## 2017-07-30 DIAGNOSIS — K921 Melena: Secondary | ICD-10-CM | POA: Diagnosis not present

## 2017-07-30 DIAGNOSIS — R195 Other fecal abnormalities: Secondary | ICD-10-CM

## 2017-07-30 MED ORDER — PREPARATION H 50 % EX PADS
1.0000 | MEDICATED_PAD | Freq: Two times a day (BID) | CUTANEOUS | 3 refills | Status: DC
Start: 2017-07-30 — End: 2018-04-25

## 2017-07-30 NOTE — Progress Notes (Addendum)
Subjective:    Patient ID: Tamara Henry , female   DOB: 09-Nov-1960 , 56 y.o..   MRN: [id number]  HPI  Tamara Henry is a 56 yo F with PMH of CAD s/p CABG, PE, HTN, DM, CKD  here for  Chief Complaint  Patient presents with  . Diarrhea  . Hemorrhoids    1. Diarrhea, hemorrhoids, dark stools: Patient states that for the last 3 weeks she has had dark diarrhea  Up to 13 times a day.  She notes that she has been drinking a lot of water and trying to keep hydrated.  She admits to some fecal incontinence as well and has had 2 wear an adult diaper.  She notes that she has had chronic diarrhea for many years but has never been this dark.  She has also never seen a gastroenterologist for this.  She denies taking any Pepto-Bismol, no nausea or vomiting, no new foods, no recent travel, no recent camping.  Admits to increased flatulence.  Denies any dizziness, lightheadedness, abdominal pain, fever.  No sick contacts.  Previously took Imodium for her diarrhea.  Was seen in the ED on November 17 and given a prescription for Lomotil and told to call GI.  Does not know of any gross blood found in her stool. Denies any history of hemorrhoids.  Review of Systems: Per HPI.    Past Medical History: Patient Active Problem List   Diagnosis Date Noted  . Diarrhea 08/04/2017  . Arm pain, anterior, right 06/17/2017  . OSA on CPAP 05/03/2017  . Morbid obesity due to excess calories ([place]) 05/03/2017  . Vaginal discharge 03/27/2017  . Microscopic hematuria 03/27/2017  . Impingement syndrome of left shoulder 02/19/2017  . Hypersomnia 12/12/2016  . REM behavioral disorder 12/12/2016  . Asthma 12/12/2016  . Flatulence 11/17/2016  . Neck pain 11/17/2016  . CKD (chronic kidney disease), stage III ([place]) 11/02/2016  . At risk for sleep apnea 11/01/2016  . CAD (coronary artery disease), native coronary artery 10/26/2016  . Encounter for post surgical wound check 10/23/2016  . Poor social situation 10/13/2016  .  Anemia 08/30/2016  . Leg pain 08/30/2016  . Bilateral low back pain without sciatica 08/30/2016  . 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 Bilateral pulmonary embolism ([place]) 05/17/2016  . Hyperlipidemia 05/17/2016  . Depression 05/17/2016  . Diabetic peripheral neuropathy ([place]) 05/17/2016  . Neuritis of upper extremity, C7 01/15/2015  . DM type 2, uncontrolled, with renal complications ([place]) 00/93/8182  . Hypertension 01/14/2015  . Numbness and tingling of right arm 01/14/2015    Medications: reviewed and updated Current Outpatient Medications  Medication Sig Dispense Refill  . amLODipine (NORVASC) 10 MG tablet Take 1 tablet (10 mg total) by mouth daily. 90 tablet 3  . aspirin 81 MG EC tablet Take 1 tablet (81 mg total) by mouth daily.    Marland Kitchen atorvastatin (LIPITOR) 80 MG tablet Take 1 tablet (80 mg total) by mouth every evening. 90 tablet 3  . calcipotriene (DOVONOX) 0.005 % cream Apply 1 application as needed topically.    . Cholecalciferol (VITAMIN D-1000 MAX ST) 1000 units tablet Take 1,000 Units daily by mouth.    . citalopram (CELEXA) 20 MG tablet Take 1 tablet (20 mg total) by mouth daily. 30 tablet 0  . diphenoxylate-atropine (LOMOTIL) 2.5-0.025 MG tablet Take 1 tablet 4 (four) times daily as needed by mouth for diarrhea or loose stools. Take 1-2 tablets up to 4 times daily. 30 tablet 0  .  ezetimibe (ZETIA) 10 MG tablet Take 10 mg daily by mouth.  1  . ferrous sulfate 325 (65 FE) MG tablet Take 1 tablet (325 mg total) by mouth daily with breakfast. 30 tablet 3  . folic acid (FOLVITE) 109 MCG tablet Take 400 mcg by mouth daily.    . furosemide (LASIX) 40 MG tablet Take 1 tablet (40 mg total) by mouth daily. 30 tablet 3  . gabapentin (NEURONTIN) 300 MG capsule Take 900 mg by mouth in the morning, 600 mg at lunch, and 900 mg at bedtime 240 capsule 3  . guaiFENesin (MUCINEX) 600 MG 12 hr tablet Take 2 tablets (1,200 mg total) 2 (two) times daily by mouth. 14 tablet 0  . hydrochlorothiazide  (MICROZIDE) 12.5 MG capsule Take 12.5 mg daily by mouth.    Marland Kitchen HYDROcodone-acetaminophen (NORCO/VICODIN) 5-325 MG tablet Take 1 tablet by mouth 3 (three) times daily as needed for moderate pain. 30 tablet 0  . insulin aspart (NOVOLOG) 100 UNIT/ML injection Inject 5 Units into the skin 2 (two) times daily before a meal. 10 mL 3  . Insulin Glargine (LANTUS SOLOSTAR) 100 UNIT/ML Solostar Pen Inject 50 Units daily at 10 pm into the skin. 5 pen PRN  . loperamide (IMODIUM) 2 MG capsule Take 1 capsule (2 mg total) by mouth daily as needed for diarrhea or loose stools. 30 capsule 0  . Melatonin 3 MG TABS Take 6 mg by mouth at bedtime.    . metoprolol tartrate (LOPRESSOR) 25 MG tablet Take 1 tablet (25 mg total) by mouth 2 (two) times daily. 60 tablet 3  . Multiple Vitamins-Minerals (MULTIVITAMIN) tablet Take 1 tablet by mouth daily. 30 tablet 5  . omeprazole (PRILOSEC) 40 MG capsule Take 1 capsule (40 mg total) by mouth daily. 30 capsule 3  . potassium chloride SA (K-DUR,KLOR-CON) 20 MEQ tablet TAKE 1/2 TABLET BY MOUTH ONCE DAILY (Patient taking differently: TAKE 10 meq TABLET BY MOUTH ONCE DAILY) 30 tablet 2  . Probiotic Product (ALIGN) 4 MG CAPS Take 1 capsule (4 mg total) by mouth daily. 30 capsule 2  . tiZANidine (ZANAFLEX) 4 MG tablet TAKE 1 TABLET BY MOUTH EVERY 8 HOURS AS NEEDED FOR MUSCLE SPASMS 30 tablet 1  . Witch Hazel (PREPARATION H) 50 % PADS Apply 1 application 2 (two) times daily topically. 10 each 3  . XARELTO 10 MG TABS tablet Take 1 tablet (10 mg total) by mouth every morning 30 tablet 2   No current facility-administered medications for this visit.     Social Hx:  reports that she quit smoking about 11 years ago. Her smoking use included cigarettes. She has a 20.00 pack-year smoking history. she has never used smokeless tobacco.   Objective:   BP 130/76   Pulse 92   Temp 98.4 F (36.9 C) (Oral)   Ht 5\' 4"  (1.626 m)   Wt 239 lb 6.4 oz (108.6 kg)   SpO2 94%   BMI 41.09 kg/m    Physical Exam  Gen: NAD, alert, cooperative with exam, well-appearing HEENT: NCAT, PERRL, clear conjunctiva, oropharynx clear, supple neck Cardiac: Regular rate and rhythm, normal S1/S2, no murmur, no edema, capillary refill brisk  Respiratory: Clear to auscultation bilaterally, no wheezes, non-labored breathing Gastrointestinal: soft, non tender, non distended, bowel sounds present Rectal exam: negative without mass, lesions or tenderness, internal hemorrhoids noted. Skin: no rashes, normal turgor  Neurological: no gross deficits.  Psych: good insight, normal mood and affect  Assessment & Plan:  Diarrhea Patient has a  history of chronic diarrhea for over the last month it has been darker per her report.  Went to the ED on November 17 and was FOBT positive. Hgb normal at 12.1. Rectal exam showing no active bleeding. Patient denies any gross hematochezia.  Has not picked up her prescription for Lomotil to get but continues to try Imodium.  Unclear etiology of diarrhea; differentials include malabsorption issue versus IBD versus IBS versus upper GI bleed.  Less likely infectious process as patient is afebrile and otherwise asymptomatic. - Referral to GI - GI path panel -Lomotil as prescribed -Emphasized that the patient should continue to keep well-hydrated -ED Return precautions discussed  Orders Placed This Encounter  Procedures  . CBC with Differential  . Comprehensive metabolic panel    Order Specific Question:   Has the patient fasted?    Answer:   No  . GI Profile, Stool, PCR  . Ambulatory referral to Gastroenterology    Referral Priority:   Routine    Referral Type:   Consultation    Referral Reason:   Specialty Services Required    Number of Visits Requested:   1   Meds ordered this encounter  Medications  . Witch Hazel (PREPARATION H) 50 % PADS    Sig: Apply 1 application 2 (two) times daily topically.    Dispense:  10 each    Refill:  [place],  MD [place], PGY-3

## 2017-07-30 NOTE — Patient Instructions (Signed)
Thank you for coming in today, it was so nice to see you! Today we talked about:    Dark bloody diarrhea: It is unclear if these are coming from your hemorrhoids or somewhere inside of your colon.  I have placed a referral for a gastroenterologist, someone will call you to schedule this please call us if you have not heard from anybody within the next week  We have ordered basic blood work for you today to make sure that you are not anemic and your electrolytes are normal  We are checking her stool for any infections  Please follow up in 2-3 weeks to follow up on diarrhea and diabetes. You can schedule this appointment at the front desk before you leave or call the clinic.  Bring in all your medications or supplements to each appointment for review.   If we ordered any tests today, you will be notified via telephone of any abnormalities. If everything is normal you will get a letter in the mail.   If you have any questions or concerns, please do not hesitate to call the office at [phone]. You can also message me directly via MyChart.   Sincerely,  Smitty Cords, MD

## 2017-07-31 ENCOUNTER — Ambulatory Visit (INDEPENDENT_AMBULATORY_CARE_PROVIDER_SITE_OTHER): Payer: Medicare Other | Admitting: Podiatry

## 2017-07-31 ENCOUNTER — Encounter: Payer: Self-pay | Admitting: Podiatry

## 2017-07-31 DIAGNOSIS — M79674 Pain in right toe(s): Secondary | ICD-10-CM | POA: Diagnosis not present

## 2017-07-31 DIAGNOSIS — E1149 Type 2 diabetes mellitus with other diabetic neurological complication: Secondary | ICD-10-CM | POA: Diagnosis not present

## 2017-07-31 DIAGNOSIS — M79675 Pain in left toe(s): Secondary | ICD-10-CM | POA: Diagnosis not present

## 2017-07-31 DIAGNOSIS — B351 Tinea unguium: Secondary | ICD-10-CM

## 2017-07-31 LAB — CBC WITH DIFFERENTIAL/PLATELET
Basophils Absolute: 0 10*3/uL (ref 0.0–0.2)
Basos: 0 %
EOS (ABSOLUTE): 0.1 10*3/uL (ref 0.0–0.4)
EOS: 1 %
HEMATOCRIT: 37.7 % (ref 34.0–46.6)
Hemoglobin: 12.1 g/dL (ref 11.1–15.9)
IMMATURE GRANULOCYTES: 0 %
Immature Grans (Abs): 0 10*3/uL (ref 0.0–0.1)
Lymphocytes Absolute: 2.6 10*3/uL (ref 0.7–3.1)
Lymphs: 29 %
MCH: 27.5 pg (ref 26.6–33.0)
MCHC: 32.1 g/dL (ref 31.5–35.7)
MCV: 86 fL (ref 79–97)
MONOS ABS: 0.6 10*3/uL (ref 0.1–0.9)
Monocytes: 7 %
NEUTROS PCT: 63 %
Neutrophils Absolute: 5.7 10*3/uL (ref 1.4–7.0)
PLATELETS: 216 10*3/uL (ref 150–379)
RBC: 4.4 x10E6/uL (ref 3.77–5.28)
RDW: 17.9 % — AB (ref 12.3–15.4)
WBC: 9.1 10*3/uL (ref 3.4–10.8)

## 2017-07-31 LAB — COMPREHENSIVE METABOLIC PANEL
A/G RATIO: 1.2 (ref 1.2–2.2)
ALK PHOS: 142 IU/L — AB (ref 39–117)
ALT: 18 IU/L (ref 0–32)
AST: 17 IU/L (ref 0–40)
Albumin: 3.7 g/dL (ref 3.5–5.5)
BILIRUBIN TOTAL: 0.3 mg/dL (ref 0.0–1.2)
BUN / CREAT RATIO: 15 (ref 9–23)
BUN: 18 mg/dL (ref 6–24)
CALCIUM: 8.7 mg/dL (ref 8.7–10.2)
CHLORIDE: 107 mmol/L — AB (ref 96–106)
CO2: 22 mmol/L (ref 20–29)
Creatinine, Ser: 1.19 mg/dL — ABNORMAL HIGH (ref 0.57–1.00)
GFR calc non Af Amer: 52 mL/min/{1.73_m2} — ABNORMAL LOW (ref >59)
GFR, EST AFRICAN AMERICAN: 60 mL/min/{1.73_m2} (ref >59)
Globulin, Total: 3.2 g/dL (ref 1.5–4.5)
Glucose: 103 mg/dL — ABNORMAL HIGH (ref 65–99)
POTASSIUM: 4.2 mmol/L (ref 3.5–5.2)
SODIUM: 142 mmol/L (ref 134–144)
Total Protein: 6.9 g/dL (ref 6.0–8.5)

## 2017-07-31 NOTE — Progress Notes (Addendum)
Complaint:  Visit Type: Patient returns to my office for continued preventative foot care services. Complaint: Patient states" my nails have grown long and thick and become painful to walk and wear shoes" Patient has been diagnosed with DM with no foot neuropathy. The patient presents for preventative foot care services. No changes to ROS.  Patient says her diabetic shoes feel tight and she has pain under outside ball of left foot.  Podiatric Exam: Vascular: dorsalis pedis and posterior tibial pulses are palpable bilateral. Capillary return is immediate. Temperature gradient is WNL. Skin turgor WNL  Sensorium: Normal Semmes Weinstein monofilament test. Normal tactile sensation bilaterally. Nail Exam: Pt has thick disfigured discolored nails with subungual debris noted bilateral entire nail hallux through fifth toenails Ulcer Exam: There is no evidence of ulcer or pre-ulcerative changes or infection. Orthopedic Exam: Muscle tone and strength are WNL. No limitations in general ROM. No crepitus or effusions noted. Foot type and digits show no abnormalities. Bony prominences are unremarkable. Skin: No Porokeratosis. No infection or ulcers  Diagnosis:  Onychomycosis, , Pain in right toe, pain in left toes  Treatment & Plan Procedures and Treatment: Consent by patient was obtained for treatment procedures.   Debridement of mycotic and hypertrophic toenails, 1  bilateral and clearing of subungual debris. No ulceration, no infection noted. Debride callus on distal aspect hallux  B/L.  Patient needs an appointment with Liliane Channel.  ABN signed for 2018. Return Visit-Office Procedure: Patient instructed to return to the office for a follow up visit 3 months for continued evaluation and treatment.    Gardiner Barefoot DPM

## 2017-08-02 LAB — GI PROFILE, STOOL, PCR
Adenovirus F 40/41: NOT DETECTED
Astrovirus: NOT DETECTED
C DIFFICILE TOXIN A/B: NOT DETECTED
CAMPYLOBACTER: NOT DETECTED
Cryptosporidium: NOT DETECTED
Cyclospora cayetanensis: NOT DETECTED
ENTEROTOXIGENIC E COLI: NOT DETECTED
Entamoeba histolytica: NOT DETECTED
Enteroaggregative E coli: NOT DETECTED
Enteropathogenic E coli: NOT DETECTED
GIARDIA LAMBLIA: NOT DETECTED
NOROVIRUS GI/GII: NOT DETECTED
PLESIOMONAS SHIGELLOIDES: NOT DETECTED
ROTAVIRUS A: NOT DETECTED
SAPOVIRUS: NOT DETECTED
SHIGA-TOXIN-PRODUCING E COLI: NOT DETECTED
SHIGELLA/ENTEROINVASIVE E COLI: NOT DETECTED
Salmonella: NOT DETECTED
Vibrio cholerae: NOT DETECTED
Vibrio: NOT DETECTED
YERSINIA ENTEROCOLITICA: NOT DETECTED

## 2017-08-03 ENCOUNTER — Other Ambulatory Visit: Payer: Self-pay | Admitting: Family Medicine

## 2017-08-04 DIAGNOSIS — R197 Diarrhea, unspecified: Secondary | ICD-10-CM | POA: Insufficient documentation

## 2017-08-04 NOTE — Assessment & Plan Note (Addendum)
Patient has a history of chronic diarrhea for over the last month it has been darker per her report.  Went to the ED on November 17 and was FOBT positive. Hgb normal at 12.1. Rectal exam showing no active bleeding. Patient denies any gross hematochezia.  Has not picked up her prescription for Lomotil to get but continues to try Imodium.  Unclear etiology of diarrhea; differentials include malabsorption issue versus IBD versus IBS versus upper GI bleed.  Less likely infectious process as patient is afebrile and otherwise asymptomatic. - Referral to GI - GI path panel -Lomotil as prescribed -Emphasized that the patient should continue to keep well-hydrated -ED Return precautions discussed

## 2017-08-07 ENCOUNTER — Encounter: Payer: Self-pay | Admitting: Physician Assistant

## 2017-08-08 ENCOUNTER — Ambulatory Visit (INDEPENDENT_AMBULATORY_CARE_PROVIDER_SITE_OTHER): Payer: Medicare Other | Admitting: Licensed Clinical Social Worker

## 2017-08-08 DIAGNOSIS — R4589 Other symptoms and signs involving emotional state: Secondary | ICD-10-CM

## 2017-08-08 DIAGNOSIS — F329 Major depressive disorder, single episode, unspecified: Secondary | ICD-10-CM

## 2017-08-08 NOTE — Progress Notes (Signed)
Type of Service: [place] F/U Total time:30 minutes :  Interpretor:No.    Reason for follow-up: Continue brief intervention to assist patient with managing symptoms of anxiety and depression, as well as family stressore. Patient presented pleasant and engaged in conversation.   Appearance:Neat ; Thought process: Coherent; Affect: Appropriate.  Patient reports overall she is doing well.  She has implemented relaxed breathing , continues going to the gym, going to a new church.     GOALS: Patient will reduce symptoms of: anxiety, depression and stress , and increase  ability TR:RNHAFB skills, self-management skills and stress reduction, Increase healthy adjustment to current life circumstances. Intervention: Motivational Interviewing, Solution-Focused Strategies, Veterinary surgeon and Supportive Counseling, Reflective listening, Behavioral Therapy (Relaxed breathing)   Issues discussed: support system  ; benefits of social support ; will start credit class at [employer], called psychiatry and therapy for ongoing appointment, managing family stressors, personal care, and options for legal counsel.    ASSESSMENT:Patient continues to experience difficulty with managing family related stress. She is doing well with implementing interventions and behavioral activation.  Patient may benefit from, and is in agreement to continue further assessment and brief therapeutic interventions to assist with managing her symptoms until she is connect for ongoing therapy.  PLAN: 1. Patient will F/U with LCSW in two weeks 2. Behavioral recommendations: behavioral activation, and relaxed breathing 3. Referral: Psychiatrist and Counselor, f/u on appointment.  Casimer Lanius, LCSW Licensed Clinical Social Worker [place]   [phone] 4:29 PM

## 2017-08-13 ENCOUNTER — Ambulatory Visit: Payer: Medicare Other | Admitting: Orthotics

## 2017-08-13 DIAGNOSIS — M2041 Other hammer toe(s) (acquired), right foot: Secondary | ICD-10-CM

## 2017-08-13 DIAGNOSIS — M2042 Other hammer toe(s) (acquired), left foot: Principal | ICD-10-CM

## 2017-08-13 NOTE — Progress Notes (Signed)
Patient came in today complaining that her shoes (DBS) and diabetic inserts hurt her feet in toebox, esp big toes bilat.  Upon further inquiry she has ingrown toenail hallux and too much pressure on same.  Told her I could not return shoes because they were worn, but I took 1/8" off the orthotic base to better fit in shoes AND she has an appointment with dr Prudence Davidson to ck out the ingrown toenails.

## 2017-08-14 DIAGNOSIS — R269 Unspecified abnormalities of gait and mobility: Secondary | ICD-10-CM | POA: Diagnosis not present

## 2017-08-14 DIAGNOSIS — G4733 Obstructive sleep apnea (adult) (pediatric): Secondary | ICD-10-CM | POA: Diagnosis not present

## 2017-08-15 ENCOUNTER — Encounter: Payer: Self-pay | Admitting: Podiatry

## 2017-08-15 ENCOUNTER — Telehealth: Payer: Self-pay | Admitting: Emergency Medicine

## 2017-08-15 ENCOUNTER — Ambulatory Visit (INDEPENDENT_AMBULATORY_CARE_PROVIDER_SITE_OTHER): Payer: Medicare Other | Admitting: Physician Assistant

## 2017-08-15 ENCOUNTER — Ambulatory Visit (INDEPENDENT_AMBULATORY_CARE_PROVIDER_SITE_OTHER): Payer: Medicare Other

## 2017-08-15 ENCOUNTER — Ambulatory Visit (INDEPENDENT_AMBULATORY_CARE_PROVIDER_SITE_OTHER): Payer: Medicare Other | Admitting: Podiatry

## 2017-08-15 ENCOUNTER — Encounter: Payer: Self-pay | Admitting: Physician Assistant

## 2017-08-15 VITALS — BP 106/68 | HR 71 | Ht 64.0 in | Wt 242.0 lb

## 2017-08-15 DIAGNOSIS — M205X1 Other deformities of toe(s) (acquired), right foot: Secondary | ICD-10-CM

## 2017-08-15 DIAGNOSIS — R197 Diarrhea, unspecified: Secondary | ICD-10-CM

## 2017-08-15 DIAGNOSIS — R1013 Epigastric pain: Secondary | ICD-10-CM | POA: Diagnosis not present

## 2017-08-15 DIAGNOSIS — K219 Gastro-esophageal reflux disease without esophagitis: Secondary | ICD-10-CM

## 2017-08-15 DIAGNOSIS — M2021 Hallux rigidus, right foot: Secondary | ICD-10-CM

## 2017-08-15 DIAGNOSIS — M79672 Pain in left foot: Secondary | ICD-10-CM

## 2017-08-15 DIAGNOSIS — K921 Melena: Secondary | ICD-10-CM

## 2017-08-15 DIAGNOSIS — M205X2 Other deformities of toe(s) (acquired), left foot: Secondary | ICD-10-CM | POA: Diagnosis not present

## 2017-08-15 DIAGNOSIS — M79671 Pain in right foot: Secondary | ICD-10-CM | POA: Diagnosis not present

## 2017-08-15 MED ORDER — DIPHENOXYLATE-ATROPINE 2.5-0.025 MG PO TABS: 1.0000 | ORAL_TABLET | Freq: Four times a day (QID) | ORAL | 0 refills | Status: DC | PRN

## 2017-08-15 MED ORDER — PANTOPRAZOLE SODIUM 40 MG PO TBEC: 40.0000 mg | DELAYED_RELEASE_TABLET | Freq: Two times a day (BID) | ORAL | 3 refills | Status: DC

## 2017-08-15 NOTE — Progress Notes (Signed)
This patient presents the office stating that she believes she is having pain in both big toes because of her ingrowing toenail.  She has been seen in the office by myself approximately 2 weeks ago for preventative foot care services.  She was also seen between that visit and now by Rickto pick up her diabetic shoes.  When she was picking up her shoes. She said she thought she was experiencing pain from her ingrowing toenails both big toes.  Rectus therefore, told her to make an appointment with me for an evaluation of these ingrowing toenail complaints.  She says these toes are painful and throbbing at night after days activity.  She says she was in a parade and walked a long distance and there was significant pain noted to the big toes especially her right big toe. She says she was nearly in tears from the pain that she was experiencing.  She presents the office today for an evaluation and treatment of this painful foot condition.  General Appearance  Alert, conversant and in no acute stress.  Vascular  Dorsalis pedis and posterior pulses are palpable  bilaterally.  Capillary return is within normal limits  Bilaterally. Temperature is within normal limits  Bilaterally  Neurologic  Senn-Weinstein monofilament wire test within normal limits  bilaterally. Muscle power  Within normal limits bilaterally.  Nails Thick disfigured discolored nails with subungual debride bilaterally from hallux to fifth toes bilaterally. No evidence of bacterial infection or drainage bilaterally.  Orthopedic  No limitations of motion of motion feet bilaterally.  No crepitus or effusions noted.  Palpable pain noted at the level of the first MPJ of the right greater than the left.  There is limited range of motion of the first MPJ of the right foot.  There is palpable dorsal lipping on the first metatarsal of the right foot.  No evidence of any swelling or redness noted.    Skin  normotropic skin with no porokeratosis noted  bilaterally.  No signs of infections or ulcers noted.    Hallux limitus 1st MPJ  Right  greater than left  [place].  X-rays taken do reveal significant narrowing of the first MPJ of the right foot with dorsal lipping noted at the level of the first metatarsal right foot.  The left foot has just started narrowing and no evidence of any dorsal lipping noted.  Patient has been experiencing pain in her big toe joint when she walks and not from her nail which had no evidence of any infection or redness or inflammation. Told this patient. She needs to ambulate with thick soled shoes and limit her activity until the inflammation diminishes.  I also told her to bring her diabetic shoes with the new diabetic insoles to [place] to have a kinetic redness made in the insoles of both diabetic shoes.  Patient has kidney pathology, so no anti-inflammatory medication was recommended.  I did tell her that she could apply  Voltaren gel to her first MPJ of the right foot to help diminish her pain.  RTC 4 weeks   Gardiner Barefoot DPM

## 2017-08-15 NOTE — Progress Notes (Signed)
Chief Complaint: Melena, diarrhea, GERD  HPI: Tamara Henry is a 56 year old African-American female with a past medical history of CAD status post NSTEMI and CABG maintained on Xarelto, as well as others listed below, who was referred to me by Carlyle Dolly, MD for a complaint of melena, diarrhea and GERD.      Patient was recently seen in the ED 11/718 for a complaint of diarrhea and black stools.  At that time, she described this going on for 3 weeks and at least 6 watery stools per day.  She had rectal exam at that time which showed a "atrophied prolapsed internal hemorrhoid" and was otherwise normal.  Hemoccult was positive.  Labs including CBC showed hemoglobin minimally decreased at 11.7, CMP showed glucose of 187 and a creatinine of 1.52.  At times recommend she follow with [place].    Patient also had a GI profile stool study done on 07/30/17 which was negative/normal.    Patient's last colonoscopy was completed by [facility] 11/10/13 and was normal other than mild diverticulosis in the ascending colon.  No specimens were obtained.  (Of note patient had a prior colonoscopy a few months before this and did not have good prep, so this was repeated) Today, the patient describes that she has had diarrhea for "years".  Apparently, the patient has had at least 6 loose watery stools per day which are unexpected and sometimes urgent for at least 10 or more years.  Patient tells me that in her childhood she did not have loose stools, but she cannot remember when this transitioned.  Most recently she has noted a change in color over the past 2 months.  Patient tells me this is more black in color and sometimes sticky.  Patient is on daily iron since her heart attack in November of last year, but tells me that this "just looks different".  Patient does use Imodium and/or Lomotil on an as needed basis.  This does help to stop her stool for at least 1-2 days.  She has been doing Imodium for at least 3 or more  years.    Patient also complains today of a cough.  Patient tells me that she feels like everything started when she started coughing.  She describes coughing up a lot of mucus and tells me that she has uncontrolled reflux symptoms.  It "constantly feels like there is something hanging in the back of my throat".  Patient has tried over-the-counter Nexium but tells me this is too expensive.  Currently she is using Tums.    Patient denies of fever, chills, bright red blood in her stool, weight loss, syncope, dizziness, change in appetite, nausea, vomiting or symptoms that awaken her at night.  Past Medical History:  Diagnosis Date  . Acid reflux   . Anxiety   . Arthritis    back  . Asthma   . Back pain   . Bilateral pulmonary embolism ([place]) 05/2016  . Bipolar disorder ([place])   . CAD in native artery    S/P NSTEMI with cath showing severe 3 vessel ASCAD s/p CABGx4 07/28/16.  . Cataracts, bilateral   . CKD (chronic kidney disease), stage III (HCC)    borderline CKD II-III  . Cocaine abuse ([place])    In remission. Stopped using in early 2000's  . Depression   . Diabetes mellitus ([place])    Type II  . GERD (gastroesophageal reflux disease)   . Glaucoma   . High cholesterol   .  History of blood transfusion   . History of pulmonary embolus (PE)   . 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 Hx of chest tube placement right   . Hypertension   . Morbid obesity ([place])   . Myocardial infarction ([place])   . Neuropathy    hands and feet  . Pneumonia   . Postoperative anemia 07/2016  . PTSD (post-traumatic stress disorder)   . Stroke [facility]) 2005   no residual    Past Surgical History:  Procedure Laterality Date  . CARDIAC CATHETERIZATION N/A 07/25/2016   Procedure: Left Heart Cath and Coronary Angiography;  Surgeon: Peter M Martinique, MD;  Location: [place] [facility];  Service: Cardiovascular;  Laterality: N/A;  . COLONOSCOPY W/ POLYPECTOMY    . CORONARY ARTERY BYPASS GRAFT N/A 07/28/2016   Procedure: CORONARY  ARTERY BYPASS GRAFTING (CABG)x4 using left internal mammary and endoscopic harvest of right greater saphenous vein;  Surgeon: Ivin Poot, MD;  Location: [facility];  Service: Open Heart Surgery;  Laterality: N/A;  . DENTAL SURGERY     to removed broken teeth  . PULMONARY EMBOLISM SURGERY    . SHOULDER ARTHROSCOPY WITH ROTATOR CUFF REPAIR Left 02/19/2017   Procedure: LEFT SHOULDER ARTHROSCOPY with debridement andROTATOR CUFF REPAIR;  Surgeon: [name], MD;  Location: [facility];  Service: Orthopedics;  Laterality: Left;  . TEE WITHOUT CARDIOVERSION N/A 07/28/2016   Procedure: TRANSESOPHAGEAL ECHOCARDIOGRAM (TEE);  Surgeon: Ivin Poot, MD;  Location: [facility];  Service: Open Heart Surgery;  Laterality: N/A;  . TUBAL LIGATION      Current Outpatient Medications  Medication Sig Dispense Refill  . amLODipine (NORVASC) 10 MG tablet Take 1 tablet (10 mg total) by mouth daily. 90 tablet 3  . aspirin 81 MG EC tablet Take 1 tablet (81 mg total) by mouth daily.    . calcipotriene (DOVONOX) 0.005 % cream Apply 1 application as needed topically.    . Cholecalciferol (VITAMIN D-1000 MAX ST) 1000 units tablet Take 1,000 Units daily by mouth.    . citalopram (CELEXA) 20 MG tablet Take 1 tablet (20 mg total) by mouth daily *Morning* 30 tablet 1  . diphenoxylate-atropine (LOMOTIL) 2.5-0.025 MG tablet Take 1 tablet 4 (four) times daily as needed by mouth for diarrhea or loose stools. Take 1-2 tablets up to 4 times daily. 30 tablet 0  . ezetimibe (ZETIA) 10 MG tablet Take 10 mg daily by mouth.  1  . ferrous sulfate 325 (65 FE) MG tablet Take 1 tablet (325 mg total) by mouth daily with breakfast. 30 tablet 3  . folic acid (FOLVITE) 301 MCG tablet Take 400 mcg by mouth daily.    . furosemide (LASIX) 40 MG tablet Take 1 tablet (40 mg total) by mouth daily. 30 tablet 3  . gabapentin (NEURONTIN) 300 MG capsule Take 900 mg by mouth in the morning, 600 mg at lunch, and 900 mg at bedtime 240 capsule 3  . guaiFENesin  (MUCINEX) 600 MG 12 hr tablet Take 2 tablets (1,200 mg total) 2 (two) times daily by mouth. 14 tablet 0  . hydrochlorothiazide (MICROZIDE) 12.5 MG capsule Take 12.5 mg daily by mouth.    Marland Kitchen HYDROcodone-acetaminophen (NORCO/VICODIN) 5-325 MG tablet Take 1 tablet by mouth 3 (three) times daily as needed for moderate pain. 30 tablet 0  . insulin aspart (NOVOLOG) 100 UNIT/ML injection Inject 5 Units into the skin 2 (two) times daily before a meal. 10 mL 3  . Insulin Glargine (LANTUS SOLOSTAR) 100 UNIT/ML Solostar  Pen Inject 50 Units daily at 10 pm into the skin. 5 pen PRN  . Melatonin 3 MG TABS Take 6 mg by mouth at bedtime.    . metoprolol tartrate (LOPRESSOR) 25 MG tablet Take 1 tablet (25 mg total) by mouth 2 (two) times daily. 60 tablet 3  . Multiple Vitamins-Minerals (MULTIVITAMIN) tablet Take 1 tablet by mouth daily. 30 tablet 5  . omeprazole (PRILOSEC) 40 MG capsule Take 1 capsule (40 mg total) by mouth daily. 30 capsule 3  . potassium chloride SA (K-DUR,KLOR-CON) 20 MEQ tablet TAKE 1/2 TABLET BY MOUTH ONCE DAILY (Patient taking differently: TAKE 10 meq TABLET BY MOUTH ONCE DAILY) 30 tablet 2  . Probiotic Product (ALIGN) 4 MG CAPS Take 1 capsule (4 mg total) by mouth daily. 30 capsule 2  . tiZANidine (ZANAFLEX) 4 MG tablet TAKE 1 TABLET BY MOUTH EVERY 8 HOURS AS NEEDED FOR MUSCLE SPASMS 30 tablet 1  . Witch Hazel (PREPARATION H) 50 % PADS Apply 1 application 2 (two) times daily topically. 10 each 3  . XARELTO 10 MG TABS tablet Take 1 tablet (10 mg total) by mouth every morning 30 tablet 2  . atorvastatin (LIPITOR) 80 MG tablet Take 1 tablet (80 mg total) by mouth every evening. 90 tablet 3   No current facility-administered medications for this visit.     Allergies as of 08/15/2017 - Review Complete 08/15/2017  Allergen Reaction Noted  . Lactose intolerance (gi) Diarrhea 07/23/2016  . Lisinopril Other (See Comments) and Cough 10/25/2012  . Reglan [metoclopramide] Other (See Comments)  05/17/2016  . Wellbutrin [bupropion] Nausea And Vomiting and Cough 05/17/2016  . Latex Rash 07/23/2016  . Metformin and related Diarrhea 10/25/2012  . Penicillins Rash 07/23/2016  . Sulfa antibiotics Itching 10/25/2012  . Tape Rash 07/23/2016    Family History  Problem Relation Age of Onset  . Pancreatitis Mother   . Diabetes type II Mother   . Diabetes Mother   . CAD Father   . Heart attack Father   . Diabetes type II Sister   . Diabetes Sister   . Diabetes type II Brother   . Diabetes Brother   . Diabetes Brother   . Diabetes Brother   . Clotting disorder Son        heart attack x 3   . Colon cancer Neg Hx   . Stomach cancer Neg Hx     Social History   Socioeconomic History  . Marital status: Widowed    Spouse name: Not on file  . Number of children: 4  . Years of education: 58  . Highest education level: Not on file  Social Needs  . Financial resource strain: Not on file  . Food insecurity - worry: Not on file  . Food insecurity - inability: Not on file  . Transportation needs - medical: Not on file  . Transportation needs - non-medical: Not on file  Occupational History  . Occupation: disabled  Tobacco Use  . Smoking status: Former Smoker    Packs/day: 2.00    Years: 10.00    Pack years: 20.00    Types: Cigarettes    Last attempt to quit: 09/11/2005    Years since quitting: 11.9  . Smokeless tobacco: Never Used  Substance and Sexual Activity  . Alcohol use: No  . Drug use: No  . Sexual activity: No    Birth control/protection: Surgical  Other Topics Concern  . Not on file  Social History Narrative  Drinks no caffeine       Current Social History 06/26/2017           Patient lives with daughter Arley Phenix and 4 grandchildren in one level home 06/26/2017   Transportation: Patient currently relies on medicaid transportation 06/26/2017   Important Relationships Past[place] in [place] and [place] here 06/26/2017    Pets: None 06/26/2017   Education / Work:   12 th grade/ Disabled 06/26/2017   Interests / Fun: Go to [employer] and church 06/26/2017   Current Stressors: Health and two sons and godson incarcerated 06/26/2017   Religious / Personal Beliefs: Pentacostal 06/26/2017   Other: Need help with mental health 06/26/2017   L. Ducatte, RN, BSN                                                                                                  Review of Systems:    Constitutional: No weight loss, fever or chills Skin: No rash Cardiovascular: No chest pain, chest pressure or palpitations   Respiratory: Positive for cough and shortness of breath Gastrointestinal: See HPI and otherwise negative Genitourinary: No dysuria or change in urinary frequency Neurological: No headache, dizziness or syncope Musculoskeletal: Positive for back pain and arthritis Hematologic: No bleeding or bruising Psychiatric: Positive for anxiety and depression    Physical Exam:  Vital signs: BP 106/68   Pulse 71   Ht 5\' 4"  (1.626 m)   Wt 242 lb (109.8 kg)   BMI 41.54 kg/m    Constitutional:   Pleasant Overweight AA female appears to be in NAD, Well developed, Well nourished, alert and cooperative Head:  Normocephalic and atraumatic. Eyes:   PEERL, EOMI. No icterus. Conjunctiva pink. Ears:  Normal auditory acuity. Neck:  Supple Throat: Oral cavity and pharynx without inflammation, swelling or lesion. Lip droop on right side Respiratory: Respirations even and unlabored. Lungs clear to auscultation bilaterally.   No wheezes, crackles, or rhonchi.  Cardiovascular: Normal S1, S2. No MRG. Regular rate and rhythm. No peripheral edema, cyanosis or pallor.  Gastrointestinal:  Soft, nondistended, moderate epigastric ttp, No rebound or guarding. Normal bowel sounds. No appreciable masses or hepatomegaly. Rectal:  Not performed.  Msk:  Symmetrical without gross deformities. Without edema, no deformity or joint abnormality.  Neurologic:  Alert and  oriented x4;  grossly normal  neurologically.  Skin:   Dry and intact without significant lesions or rashes. Psychiatric: Demonstrates good judgement and reason without abnormal affect or behaviors.  RELEVANT LABS AND IMAGING: CBC    Component Value Date/Time   WBC 9.1 07/30/2017 1602   WBC 7.5 07/28/2017 1630   RBC 4.40 07/30/2017 1602   RBC 4.29 07/28/2017 1630   HGB 12.1 07/30/2017 1602   HCT 37.7 07/30/2017 1602   PLT 216 07/30/2017 1602   MCV 86 07/30/2017 1602   MCH 27.5 07/30/2017 1602   MCH 27.3 07/28/2017 1630   MCHC 32.1 07/30/2017 1602   MCHC 30.6 07/28/2017 1630   RDW 17.9 (H) 07/30/2017 1602   LYMPHSABS 2.6 07/30/2017 1602   MONOABS 1.2 (H) 05/19/2016 0500   EOSABS 0.1 07/30/2017 1602  BASOSABS 0.0 07/30/2017 1602    CMP     Component Value Date/Time   NA 142 07/30/2017 1602   K 4.2 07/30/2017 1602   CL 107 (H) 07/30/2017 1602   CO2 22 07/30/2017 1602   GLUCOSE 103 (H) 07/30/2017 1602   GLUCOSE 187 (H) 07/28/2017 1630   BUN 18 07/30/2017 1602   CREATININE 1.19 (H) 07/30/2017 1602   CREATININE 1.68 (H) 08/17/2016 1118   CALCIUM 8.7 07/30/2017 1602   PROT 6.9 07/30/2017 1602   ALBUMIN 3.7 07/30/2017 1602   AST 17 07/30/2017 1602   ALT 18 07/30/2017 1602   ALKPHOS 142 (H) 07/30/2017 1602   BILITOT 0.3 07/30/2017 1602   GFRNONAA 52 (L) 07/30/2017 1602   GFRAA 60 07/30/2017 1602    Assessment: 1.  Melena: For the past 2 months, hemoglobin is stable, question possible upper GI bleed 2.  Diarrhea: Chronic for the patient for over 20 years, recent stool pathogen panel was negative, Hemoccult positive, question IBS versus pancreatic insufficiency versus other 3.  Epigastric pain: At time of exam today, likely due to below 4.  GERD: Uncontrolled currently, seems to have increased over the past 2 months when the patient started seeing dark stools, no NSAIDs  Plan: 1.  Scheduled patient for EGD in the [facility] [facility] with Dr. Silverio Decamp.  Did discuss risks, benefits, limitations and alternatives and  the patient agrees to proceed. 2.  Patient was advised to hold her Xarelto for 2 days prior to her procedure.  We will communicate with her cardiologist to ensure that holding her Xarelto is acceptable. 3.  Patient was started on Pantoprazole 40 mg, 30-60 minutes for breakfast and dinner #60 with 3 refills 4.  Reviewed antireflux diet and lifestyle modifications 5.  Discussed patient's chronic diarrhea, reviewed stool studies.  It does not appear patient had any biopsies taken for possible microscopic colitis at time of last colonoscopy.  This could be discussed in the future.  Patient may also benefit from cholestyramine?  Patient would likely benefit from further stool studies, can follow-up on this after EGD for acute symptoms of melena. 6.  Refilled patient's Lomotil every 4-6 hours #60 with 3 refills 7.  Patient followed in clinic for recommendations after time of EGD with Dr. Silverio Decamp.  Ellouise Newer, PA-C [facility] 08/15/2017, 9:09 AM  Cc: Carlyle Dolly, MD

## 2017-08-15 NOTE — Telephone Encounter (Signed)
   Tamara Henry 01/27/1961 [id number]   We have scheduled the above named patient for an endoscopy procedure. Our records show that she is on anticoagulation therapy.  Please advise as to whether the patient may come off their therapy of Xarelto 2 days prior to their procedure which is scheduled for 08/21/17.  Please ROUTE your response to Tinnie Gens, CMA.   Sincerely,    [facility]

## 2017-08-15 NOTE — Patient Instructions (Addendum)
We have sent the following medications to your pharmacy for you to pick up at your convenience:  Pantoprazole 40 mg twice a day 30-60 mins before lunch and dinner.   Lomotil every 4-6 hours as needed for diarrhea.   You have been scheduled for an endoscopy. Please follow written instructions given to you at your visit today. If you use inhalers (even only as needed), please bring them with you on the day of your procedure. Your physician has requested that you go to www.startemmi.com and enter the access code given to you at your visit today. This web site gives a general overview about your procedure. However, you should still follow specific instructions given to you by our office regarding your preparation for the procedure.

## 2017-08-17 NOTE — Telephone Encounter (Signed)
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

## 2017-08-17 NOTE — Progress Notes (Signed)
Reviewed and agree with documentation and assessment and plan. K. Veena Nandigam , MD   

## 2017-08-17 NOTE — Telephone Encounter (Signed)
   Chart reviewed as part of pre-operative protocol coverage. Will route to pharm for input.  Charlie Pitter, PA-C 08/17/2017, 10:33 AM

## 2017-08-18 NOTE — Telephone Encounter (Signed)
Please advise if patient can hold Xarelto 2 days before procedure on 08/21/17.

## 2017-08-18 NOTE — Telephone Encounter (Signed)
   Chart reviewed as part of pre-operative protocol coverage.  See pharmacy input below regarding Xarelto.  Will route this bundled recommendation to requesting provider via Epic. Please call with questions.   Charlie Pitter, PA-C 08/18/2017, 2:33 PM

## 2017-08-20 ENCOUNTER — Ambulatory Visit: Payer: Self-pay | Admitting: Family Medicine

## 2017-08-21 ENCOUNTER — Telehealth: Payer: Self-pay | Admitting: Acute Care

## 2017-08-21 ENCOUNTER — Encounter: Payer: Medicare Other | Admitting: Gastroenterology

## 2017-08-21 NOTE — Telephone Encounter (Signed)
Desiree. I have only seen this patient for sleep apnea in the office x 1. This message was routed to me on a Sunday 12/9, the office was closed on Monday 12/10, and today, 12/11 is the patient's procedure date. Please let me know if this is still unresolved. Thank you.

## 2017-08-22 ENCOUNTER — Ambulatory Visit: Payer: Self-pay

## 2017-08-22 NOTE — Telephone Encounter (Signed)
Left message on patients voicemail to call office.   

## 2017-08-22 NOTE — Telephone Encounter (Addendum)
Patient receiving Xarelto from [facility]. She may hold it 2 days prior to endoscopy.   Smitty Cords, MD [place], PGY-3

## 2017-08-22 NOTE — Telephone Encounter (Signed)
Tamara Kaufman,  Ms. Simm's appointment has been rescheduled to 08/30/17. It looks like there is some confusion on who is prescribing her Xarelto. I will try and contact her PCP and get an answer. Thanks for your help!   Desiree

## 2017-08-23 NOTE — Telephone Encounter (Signed)
Patient returned phone call. Best # [phone]

## 2017-08-23 NOTE — Telephone Encounter (Signed)
Spoke to pt and informed her to hold Xarelto 2 days before her procedure. Instructed her to take her last dose on Monday 12/17 and to resume after her procedure as directed at discharge. She agreed with the plan and verbalized understanding.

## 2017-08-27 ENCOUNTER — Other Ambulatory Visit: Payer: Self-pay | Admitting: Family Medicine

## 2017-08-27 ENCOUNTER — Other Ambulatory Visit: Payer: Self-pay | Admitting: Internal Medicine

## 2017-08-27 ENCOUNTER — Ambulatory Visit: Payer: Medicare Other | Admitting: Orthotics

## 2017-08-27 ENCOUNTER — Other Ambulatory Visit: Payer: Medicare Other | Admitting: Orthotics

## 2017-08-27 DIAGNOSIS — M205X1 Other deformities of toe(s) (acquired), right foot: Secondary | ICD-10-CM

## 2017-08-27 DIAGNOSIS — M2021 Hallux rigidus, right foot: Principal | ICD-10-CM

## 2017-08-27 DIAGNOSIS — M2041 Other hammer toe(s) (acquired), right foot: Secondary | ICD-10-CM

## 2017-08-27 DIAGNOSIS — M2042 Other hammer toe(s) (acquired), left foot: Secondary | ICD-10-CM

## 2017-08-27 DIAGNOSIS — E113513 Type 2 diabetes mellitus with proliferative diabetic retinopathy with macular edema, bilateral: Secondary | ICD-10-CM | POA: Diagnosis not present

## 2017-08-27 NOTE — Progress Notes (Signed)
Patient came in complaint of pain distal tip of hallux right greater than left.  Dr Prudence Davidson suggests a kinetic wedge to be added to diabetic insert.  I added a skived KW to R diabetic insert to see what outcome we could achieve.  If good outcome and pain diminshed, I will do the left side as well.

## 2017-08-28 ENCOUNTER — Other Ambulatory Visit: Payer: Self-pay | Admitting: Family Medicine

## 2017-08-28 DIAGNOSIS — K219 Gastro-esophageal reflux disease without esophagitis: Secondary | ICD-10-CM | POA: Insufficient documentation

## 2017-08-28 DIAGNOSIS — H903 Sensorineural hearing loss, bilateral: Secondary | ICD-10-CM | POA: Diagnosis not present

## 2017-08-29 ENCOUNTER — Ambulatory Visit (INDEPENDENT_AMBULATORY_CARE_PROVIDER_SITE_OTHER): Payer: Medicare Other | Admitting: Licensed Clinical Social Worker

## 2017-08-29 DIAGNOSIS — F329 Major depressive disorder, single episode, unspecified: Secondary | ICD-10-CM

## 2017-08-29 DIAGNOSIS — R4589 Other symptoms and signs involving emotional state: Secondary | ICD-10-CM

## 2017-08-29 NOTE — Progress Notes (Signed)
Type of Service: Integrated Behavioral Health 5th F/U Visit Total time:30 minutes :  Interpretor:No.    Reason for follow-up: Continue brief intervention to assist patient with managing symptoms of anxiety and depression, as well as managing family stressors . Reports  minimal  impairment in daily functions. Patient is pleasant and engaged in conversation.   Appearance:Neat ; Thought process: Coherent; Affect: Appropriate.  Patient continues to implement interventions, has gone to consumer credit class and working on personal goals.  Depression screen [facility] 2/9 08/29/2017 07/30/2017 07/25/2017  Decreased Interest 0 0 1  Down, Depressed, Hopeless 0 0 1  PHQ - 2 Score 0 0 2  Altered sleeping 3 - 3  Tired, decreased energy 0 - 1  Change in appetite 0 - 3  Feeling bad or failure about yourself  0 - 3  Trouble concentrating 0 - 1  Moving slowly or fidgety/restless 0 - 0  Suicidal thoughts 0 - 0  PHQ-9 Score 3 - 13  Difficult doing work/chores Not difficult at all - Somewhat difficult  Some recent data might be hidden   GAD 7 : Generalized Anxiety Score 08/29/2017 07/25/2017  Nervous, Anxious, on Edge 0 0  Control/stop worrying 0 3  Worry too much - different things 0 3  Trouble relaxing 0 1  Restless 0 1  Easily annoyed or irritable 0 1  Afraid - awful might happen 0 3  Total GAD 7 Score 0 12  Anxiety Difficulty - Somewhat difficult    PHQ 9=3, indication of: mild depression. GAD-7=0, indication of no anxiety at this time.  Patient is making progress towards goal.  States she is feeling better. This is also evident by PHQ-9/GAD score above.  GOALS: Patient will reduce symptoms of: anxiety, depression and stress , and increase  ability NT:ZGYFVC skills, self-management skills and stress reduction, Increase healthy adjustment to current life circumstances  Intervention: Supportive Counseling, Reflective listening, Producer, television/film/video teaching/coping strategies   Issues  discussed: [facility] [place]  ; review of therapy goal ;  Long-term ongoing therapy ; Healthy eating ; going to gym ; and managing stressors. ASSESSMENT:Patient continues to experience minimal symptoms of depression and no symptoms of anxiety.  She continues to work on managing family stressors. Patient has met her short term therapy goals and ready to move forward with long-term therapy for previous trauma in her life.  Patient will schedule appointment with [facility] the [place]. Will continue with brief interventions with LCSW until connected to ongoing therapy.  PLAN: 1. Patient will F/U with LCSW in 3 weeks 2. Behavioral recommendations: continue self-care action plan, f/u on hardship for financial assistance with [facility]. 3. Referral: Counselor, (Family Services )  Casimer Lanius, [place] Licensed Clinical Social Worker [place]   [phone] 4:39 PM

## 2017-08-30 ENCOUNTER — Other Ambulatory Visit: Payer: Self-pay | Admitting: Family Medicine

## 2017-08-30 ENCOUNTER — Ambulatory Visit (AMBULATORY_SURGERY_CENTER): Payer: Medicare Other | Admitting: Gastroenterology

## 2017-08-30 ENCOUNTER — Other Ambulatory Visit: Payer: Self-pay

## 2017-08-30 ENCOUNTER — Encounter: Payer: Self-pay | Admitting: Gastroenterology

## 2017-08-30 VITALS — BP 145/85 | HR 76 | Temp 97.8°F | Resp 16 | Ht 64.0 in | Wt 242.0 lb

## 2017-08-30 DIAGNOSIS — K921 Melena: Secondary | ICD-10-CM | POA: Diagnosis present

## 2017-08-30 DIAGNOSIS — R1013 Epigastric pain: Secondary | ICD-10-CM | POA: Diagnosis not present

## 2017-08-30 DIAGNOSIS — Z8673 Personal history of transient ischemic attack (TIA), and cerebral infarction without residual deficits: Secondary | ICD-10-CM | POA: Diagnosis not present

## 2017-08-30 MED ORDER — SODIUM CHLORIDE 0.9 % IV SOLN: 500.0000 mL | Freq: Once | INTRAVENOUS | Status: DC

## 2017-08-30 NOTE — Progress Notes (Signed)
Report given to PACU, vss 

## 2017-08-30 NOTE — Patient Instructions (Signed)
Discharge instructions given. Normal exam. Resume previous medications. YOU HAD AN ENDOSCOPIC PROCEDURE TODAY AT THE [facility]:   Refer to the procedure report that was given to you for any specific questions about what was found during the examination.  If the procedure report does not answer your questions, please call your gastroenterologist to clarify.  If you requested that your care partner not be given the details of your procedure findings, then the procedure report has been included in a sealed envelope for you to review at your convenience later.  YOU SHOULD EXPECT: Some feelings of bloating in the abdomen. Passage of more gas than usual.  Walking can help get rid of the air that was put into your GI tract during the procedure and reduce the bloating. If you had a lower endoscopy (such as a colonoscopy or flexible sigmoidoscopy) you may notice spotting of blood in your stool or on the toilet paper. If you underwent a bowel prep for your procedure, you may not have a normal bowel movement for a few days.  Please Note:  You might notice some irritation and congestion in your nose or some drainage.  This is from the oxygen used during your procedure.  There is no need for concern and it should clear up in a day or so.  SYMPTOMS TO REPORT IMMEDIATELY:   Following upper endoscopy (EGD)  Vomiting of blood or coffee ground material  New chest pain or pain under the shoulder blades  Painful or persistently difficult swallowing  New shortness of breath  Fever of 100F or higher  Black, tarry-looking stools  For urgent or emergent issues, a gastroenterologist can be reached at any hour by calling [phone].   DIET:  We do recommend a small meal at first, but then you may proceed to your regular diet.  Drink plenty of fluids but you should avoid alcoholic beverages for 24 hours.  ACTIVITY:  You should plan to take it easy for the rest of today and you should NOT DRIVE or  use heavy machinery until tomorrow (because of the sedation medicines used during the test).    FOLLOW UP: Our staff will call the number listed on your records the next business day following your procedure to check on you and address any questions or concerns that you may have regarding the information given to you following your procedure. If we do not reach you, we will leave a message.  However, if you are feeling well and you are not experiencing any problems, there is no need to return our call.  We will assume that you have returned to your regular daily activities without incident.  If any biopsies were taken you will be contacted by phone or by letter within the next 1-3 weeks.  Please call us at [phone] if you have not heard about the biopsies in 3 weeks.    SIGNATURES/CONFIDENTIALITY: You and/or your care partner have signed paperwork which will be entered into your electronic medical record.  These signatures attest to the fact that that the information above on your After Visit Summary has been reviewed and is understood.  Full responsibility of the confidentiality of this discharge information lies with you and/or your care-partner. 

## 2017-08-30 NOTE — Op Note (Signed)
Tamara Henry Patient Name: Gracen Ringwald Procedure Date: 08/30/2017 2:32 PM MRN: [id number] Endoscopist: Mauri Pole , MD Age: 56 Referring MD:  Date of Birth: Jul 19, 1961 Gender: Female Account #: [id number] Procedure:                Upper GI endoscopy Indications:              Recent gastrointestinal bleeding, Suspected upper                            gastrointestinal bleeding, Heartburn,                            Gastro-esophageal reflux disease Medicines:                Monitored Anesthesia Care Procedure:                Pre-Anesthesia Assessment:                           - Prior to the procedure, a History and Physical                            was performed, and patient medications and                            allergies were reviewed. The patient's tolerance of                            previous anesthesia was also reviewed. The risks                            and benefits of the procedure and the sedation                            options and risks were discussed with the patient.                            All questions were answered, and informed consent                            was obtained. Prior Anticoagulants: The patient has                            taken no previous anticoagulant or antiplatelet                            agents. ASA Grade Assessment: III - A patient with                            severe systemic disease. After reviewing the risks                            and benefits, the patient was deemed in  satisfactory condition to undergo the procedure.                           After obtaining informed consent, the endoscope was                            passed under direct vision. Throughout the                            procedure, the patient's blood pressure, pulse, and                            oxygen saturations were monitored continuously. The                            Endoscope was introduced  through the mouth, and                            advanced to the second part of duodenum. The upper                            GI endoscopy was accomplished without difficulty.                            The patient tolerated the procedure well. Scope In: Scope Out: Findings:                 The esophagus was normal.                           The stomach was normal.                           The examined duodenum was normal. Complications:            No immediate complications. Estimated Blood Loss:     Estimated blood loss: none. Impression:               - Normal esophagus.                           - Normal stomach.                           - Normal examined duodenum.                           - No specimens collected. Recommendation:           - Patient has a contact number available for                            emergencies. The signs and symptoms of potential                            delayed complications were discussed with the  patient. Return to normal activities tomorrow.                            Written discharge instructions were provided to the                            patient.                           - Resume previous diet.                           - Continue present medications. Mauri Pole, MD 08/30/2017 2:55:21 PM This report has been signed electronically.

## 2017-09-06 ENCOUNTER — Other Ambulatory Visit: Payer: Medicare Other | Admitting: Orthotics

## 2017-09-07 ENCOUNTER — Telehealth: Payer: Self-pay | Admitting: *Deleted

## 2017-09-07 NOTE — Telephone Encounter (Signed)
[place] from [facility] called and wanted to clarify if pt was taking prilosec or protonix since they have orders.  Both on on patients med list and last OVN does not mention a change.   Will forward to MD for clarification. Raiya Stainback, [name], CMA'

## 2017-09-12 ENCOUNTER — Other Ambulatory Visit: Payer: Self-pay | Admitting: Podiatry

## 2017-09-12 DIAGNOSIS — M205X1 Other deformities of toe(s) (acquired), right foot: Secondary | ICD-10-CM

## 2017-09-14 DIAGNOSIS — R269 Unspecified abnormalities of gait and mobility: Secondary | ICD-10-CM | POA: Diagnosis not present

## 2017-09-14 DIAGNOSIS — G4733 Obstructive sleep apnea (adult) (pediatric): Secondary | ICD-10-CM | POA: Diagnosis not present

## 2017-09-14 NOTE — Telephone Encounter (Signed)
Pharmacy informed. Dorene Bruni Dawn, CMA  

## 2017-09-14 NOTE — Telephone Encounter (Signed)
She should just be taking Pantoprazole (protonix) and not prilosec.

## 2017-09-20 ENCOUNTER — Other Ambulatory Visit: Payer: Self-pay | Admitting: Family Medicine

## 2017-09-24 ENCOUNTER — Other Ambulatory Visit: Payer: Self-pay | Admitting: Family Medicine

## 2017-09-24 ENCOUNTER — Other Ambulatory Visit: Payer: Self-pay | Admitting: Internal Medicine

## 2017-09-24 ENCOUNTER — Encounter: Payer: Self-pay | Admitting: Family Medicine

## 2017-09-28 DIAGNOSIS — J3489 Other specified disorders of nose and nasal sinuses: Secondary | ICD-10-CM | POA: Diagnosis not present

## 2017-09-28 DIAGNOSIS — R0989 Other specified symptoms and signs involving the circulatory and respiratory systems: Secondary | ICD-10-CM | POA: Diagnosis not present

## 2017-09-28 DIAGNOSIS — J324 Chronic pansinusitis: Secondary | ICD-10-CM | POA: Insufficient documentation

## 2017-10-02 ENCOUNTER — Other Ambulatory Visit: Payer: Self-pay

## 2017-10-02 ENCOUNTER — Other Ambulatory Visit: Payer: Self-pay | Admitting: Family Medicine

## 2017-10-02 ENCOUNTER — Encounter: Payer: Self-pay | Admitting: Family Medicine

## 2017-10-02 ENCOUNTER — Ambulatory Visit (INDEPENDENT_AMBULATORY_CARE_PROVIDER_SITE_OTHER): Payer: Medicare Other | Admitting: Family Medicine

## 2017-10-02 VITALS — BP 112/60 | HR 69 | Temp 98.6°F | Ht 64.0 in | Wt 237.6 lb

## 2017-10-02 DIAGNOSIS — D649 Anemia, unspecified: Secondary | ICD-10-CM | POA: Diagnosis not present

## 2017-10-02 DIAGNOSIS — N939 Abnormal uterine and vaginal bleeding, unspecified: Secondary | ICD-10-CM | POA: Diagnosis not present

## 2017-10-02 DIAGNOSIS — E1165 Type 2 diabetes mellitus with hyperglycemia: Secondary | ICD-10-CM

## 2017-10-02 DIAGNOSIS — E1129 Type 2 diabetes mellitus with other diabetic kidney complication: Secondary | ICD-10-CM | POA: Diagnosis not present

## 2017-10-02 DIAGNOSIS — N950 Postmenopausal bleeding: Secondary | ICD-10-CM

## 2017-10-02 DIAGNOSIS — R05 Cough: Secondary | ICD-10-CM | POA: Diagnosis not present

## 2017-10-02 DIAGNOSIS — R059 Cough, unspecified: Secondary | ICD-10-CM

## 2017-10-02 DIAGNOSIS — IMO0002 Reserved for concepts with insufficient information to code with codable children: Secondary | ICD-10-CM

## 2017-10-02 LAB — POCT GLYCOSYLATED HEMOGLOBIN (HGB A1C): Hemoglobin A1C: 6.8

## 2017-10-02 LAB — POCT HEMOGLOBIN: Hemoglobin: 12.9 g/dL (ref 12.2–16.2)

## 2017-10-02 MED ORDER — PROMETHAZINE-CODEINE 6.25-10 MG/5ML PO SYRP: 5.0000 mL | ORAL_SOLUTION | Freq: Four times a day (QID) | ORAL | 0 refills | Status: AC | PRN

## 2017-10-02 NOTE — Progress Notes (Signed)
Subjective:    Patient ID: Tamara Henry , female   DOB: 09-Apr-1961 , 57 y.o..   MRN: [id number]  HPI  Tamara Henry is a 57 yo F with PMH of CAD s/p CABG, PE, HTN, T2DM, CKD III, OSA on CPAP, Asthma here for  Chief Complaint  Patient presents with  . Menorrhagia    1. Vaginal bleeding: Has been bleeding for about 2 weeks now. She notes that normally her cycle is 4 days. She notes that this flow is heavy. She has had to use 1 pad every 2 hours, completely soaked with blood. LMP was a couple years ago. Admits to some right sided cramping. She is on Xarelto   2. Cough: Intermittent for many years, but currently her cough has been going on for about 1 month. She thinks it getting worse. She has some chest pain when she's coughing. She is having trouble breathing when coughing. Cough is productive of clear liquid. She has had post tussive emesis. Denies any shortness of breath. No runny nose, sore throat, fevers. She does have albuterol she has to use it 2-3 times a day. She doesn't use her CPAP machine because she states "it makes me so sick" because she has itchiness in her nose.   Review of Systems: Per HPI.   Past Medical History: Patient Active Problem List   Diagnosis Date Noted  . Postmenopausal vaginal bleeding 10/04/2017  . Cough 10/04/2017  . Diarrhea 08/04/2017  . Arm pain, anterior, right 06/17/2017  . OSA on CPAP 05/03/2017  . Morbid obesity due to excess calories ([place]) 05/03/2017  . Vaginal discharge 03/27/2017  . Microscopic hematuria 03/27/2017  . Impingement syndrome of left shoulder 02/19/2017  . Hypersomnia 12/12/2016  . REM behavioral disorder 12/12/2016  . Asthma 12/12/2016  . Neck pain 11/17/2016  . CKD (chronic kidney disease), stage III ([place]) 11/02/2016  . CAD (coronary artery disease), native coronary artery 10/26/2016  . Poor social situation 10/13/2016  . Leg pain 08/30/2016  . Bilateral low back pain without sciatica 08/30/2016  . S/P CABG x 4  07/28/2016  . Bilateral pulmonary embolism ([place]) 05/17/2016  . Hyperlipidemia 05/17/2016  . Depression 05/17/2016  . Diabetic peripheral neuropathy ([place]) 05/17/2016  . Neuritis of upper extremity, C7 01/15/2015  . DM type 2, uncontrolled, with renal complications ([place]) 83/15/1761  . Hypertension 01/14/2015  . Numbness and tingling of right arm 01/14/2015    Medications: reviewed and updated Current Outpatient Medications  Medication Sig Dispense Refill  . amLODipine (NORVASC) 10 MG tablet Take 1 TABLET BY MOUTH daily 28 tablet 3  . aspirin 81 MG EC tablet Take 1 tablet (81 mg total) by mouth daily.    Marland Kitchen atorvastatin (LIPITOR) 80 MG tablet Take 1 tablet (80 mg total) by mouth every evening. 90 tablet 3  . calcipotriene (DOVONOX) 0.005 % cream Apply 1 application as needed topically.    . Cholecalciferol (VITAMIN D-1000 MAX ST) 1000 units tablet Take 1,000 Units daily by mouth.    . citalopram (CELEXA) 20 MG tablet Take 1 tablet (20 mg total) by mouth daily *Morning* **emergency fill 08/03/2017** 28 tablet 6  . diphenoxylate-atropine (LOMOTIL) 2.5-0.025 MG tablet Take 1 tablet by mouth 4 (four) times daily as needed for diarrhea or loose stools. Take 1-2 tablets up to 4 times daily. 30 tablet 0  . ezetimibe (ZETIA) 10 MG tablet Take 10 mg daily by mouth.  1  . ferrous sulfate 325 (65 FE) MG tablet Take 1 tablet (325  mg total) by mouth daily with breakfast. 30 tablet 3  . folic acid (FOLVITE) 967 MCG tablet Take 400 mcg by mouth daily.    . furosemide (LASIX) 40 MG tablet Take 1 TABLET BY MOUTH daily 28 tablet 3  . gabapentin (NEURONTIN) 300 MG capsule Take 3 capsules BY MOUTH EVERY MORNING, Take 2 capsules BY MOUTH at lunch, and take 3 capsules BY MOUTH at bedtime 224 capsule 2  . guaiFENesin (MUCINEX) 600 MG 12 hr tablet Take 2 tablets (1,200 mg total) 2 (two) times daily by mouth. 14 tablet 0  . hydrochlorothiazide (MICROZIDE) 12.5 MG capsule Take 12.5 mg daily by mouth.    Marland Kitchen  HYDROcodone-acetaminophen (NORCO/VICODIN) 5-325 MG tablet Take 1 tablet by mouth 3 (three) times daily as needed for moderate pain. 30 tablet 0  . insulin aspart (NOVOLOG) 100 UNIT/ML injection Inject 5 Units into the skin 2 (two) times daily before a meal. 10 mL 3  . Insulin Glargine (LANTUS SOLOSTAR) 100 UNIT/ML Solostar Pen Inject 50 Units daily at 10 pm into the skin. 5 pen PRN  . Melatonin 3 MG TABS Take 6 mg by mouth at bedtime.    . metoprolol tartrate (LOPRESSOR) 25 MG tablet Take 1 TABLET (25mg ) BY MOUTH TWICE DAILY *Morning&Evening* 56 tablet 2  . Multiple Vitamins-Minerals (MULTIVITAMIN) tablet Take 1 tablet by mouth daily. 30 tablet 5  . omeprazole (PRILOSEC) 40 MG capsule Take 1 capsule BY MOUTH daily 28 capsule 2  . pantoprazole (PROTONIX) 40 MG tablet Take 1 tablet (40 mg total) by mouth 2 (two) times daily before a meal. 90 tablet 3  . potassium chloride SA (K-DUR,KLOR-CON) 20 MEQ tablet take half a TABLET BY MOUTH EVERY MORNING 14 tablet 2  . Probiotic Product (ALIGN) 4 MG CAPS Take 1 capsule (4 mg total) by mouth daily. 30 capsule 2  . promethazine-codeine (PHENERGAN WITH CODEINE) 6.25-10 MG/5ML syrup Take 5 mLs by mouth every 6 (six) hours as needed for up to 5 days for cough. 120 mL 0  . tiZANidine (ZANAFLEX) 4 MG capsule TAKE 1 TABLET BY MOUTH EVERY 8 HOURS AS NEEDED FOR MUSCLE SPASMS 30 capsule 1  . Witch Hazel (PREPARATION H) 50 % PADS Apply 1 application 2 (two) times daily topically. 10 each 3  . XARELTO 10 MG TABS tablet Take 1 tablet (10 mg total) by mouth every morning 28 tablet 2   Current Facility-Administered Medications  Medication Dose Route Frequency Provider Last Rate Last Dose  . 0.9 %  sodium chloride infusion  500 mL Intravenous Once Nandigam, Venia Minks, MD        Social Hx:  reports that she quit smoking about 12 years ago. Her smoking use included cigarettes. She has a 20.00 pack-year smoking history. she has never used smokeless tobacco.   Objective:    BP 112/60   Pulse 69   Temp 98.6 F (37 C) (Oral)   Ht 5\' 4"  (1.626 m)   Wt 237 lb 9.6 oz (107.8 kg)   SpO2 99%   BMI 40.78 kg/m  Physical Exam  Gen: NAD, alert, cooperative with exam, well-appearing HEENT: NCAT, PERRL, clear conjunctiva, oropharynx clear, supple neck Cardiac: Regular rate and rhythm, normal S1/S2, no murmur, no edema, capillary refill brisk  Respiratory: Clear to auscultation bilaterally, no wheezes, non-labored breathing Psych: good insight, normal mood and affect  Results for orders placed or performed in visit on 10/02/17  HgB A1c  Result Value Ref Range   Hemoglobin A1C 6.8  Hemoglobin  Result Value Ref Range   Hemoglobin 12.9 12.2 - 16.2 g/dL    Assessment & Plan:  Postmenopausal vaginal bleeding Vaginal bleeding x 2 weeks. Has not had a period in 2 years. Vitals normal here. POC hemoglobin normal at 12.9. Concerned for fibroids versus endometrial carcinoma.  - Urgent pelvic transvaginal U/S - Front office closed as patient was the last patient of the day, asked her to call and schedule an appointment for an endometrial biopsy with the GYN clinic  Cough Acute on chronic cough. Likely multifactorial, hx of asthma, OSA, and recent URI. Is non compliant with CPAP. Lungs are clear to auscultation, O2 99% on room air.  - Discussed wearing CPAP - Discussed using albuterol every 4 hours for the next 24 hours - Phenergan with codeine given for post tussive emesis - Return in 2 weeks  Orders Placed This Encounter  Procedures  . US PELVIC COMPLETE WITH TRANSVAGINAL    Standing Status:   Future    Number of Occurrences:   1    Standing Expiration Date:   12/01/2018    Order Specific Question:   Reason for Exam (SYMPTOM  OR DIAGNOSIS REQUIRED)    Answer:   vaginal bleeding    Order Specific Question:   Preferred imaging location?    Answer:   [facility]  . HgB A1c  . Hemoglobin   Meds ordered this encounter  Medications  . promethazine-codeine  (PHENERGAN WITH CODEINE) 6.25-10 MG/5ML syrup    Sig: Take 5 mLs by mouth every 6 (six) hours as needed for up to 5 days for cough.    Dispense:  120 mL    Refill:  0    Smitty Cords, MD [place], PGY-3

## 2017-10-02 NOTE — Patient Instructions (Signed)
Thank you for coming in today, it was so nice to see you! Today we talked about:    Vaginal bleeding: We will need to get a ultrasound to see what's going on. I would also like for you to get an endometrial biopsy. STOP THE XARELTO. Please call the clinic tomorrow to schedule an appointment with the gynecology clinic for an endometrial biopsy.   I think your asthma is flaring up. For your cough please use the albuterol every 4 hours for the next 24 hours, then as needed. I have given you a prescription for a cough medication  Please follow up in 1 week to make sure you hemoglobin is not dropping and causing anemia.   If you have any questions or concerns, please do not hesitate to call the office at [phone]. You can also message me directly via MyChart.   Sincerely,  Smitty Cords, MD

## 2017-10-03 ENCOUNTER — Telehealth: Payer: Self-pay

## 2017-10-03 ENCOUNTER — Telehealth: Payer: Self-pay | Admitting: Family Medicine

## 2017-10-03 ENCOUNTER — Ambulatory Visit (HOSPITAL_COMMUNITY)
Admission: RE | Admit: 2017-10-03 | Discharge: 2017-10-03 | Disposition: A | Discharge status: Home / Self Care | Payer: Medicare Other | Source: Ambulatory Visit | Attending: Family Medicine | Admitting: Family Medicine

## 2017-10-03 DIAGNOSIS — R9389 Abnormal findings on diagnostic imaging of other specified body structures: Secondary | ICD-10-CM | POA: Insufficient documentation

## 2017-10-03 DIAGNOSIS — N859 Noninflammatory disorder of uterus, unspecified: Secondary | ICD-10-CM | POA: Insufficient documentation

## 2017-10-03 DIAGNOSIS — N939 Abnormal uterine and vaginal bleeding, unspecified: Secondary | ICD-10-CM

## 2017-10-03 NOTE — Telephone Encounter (Signed)
Received call from pharmacy on nurse line. Patient Tizanidine prescribed on 09/24/2017 was for capsules which requires a PA. Please change to tablets which is covered if appropriate.  If there is a reason pt requires capsules a PA will have to be completed at [facility] [phone]. Danley Danker, RN [facility] RN)

## 2017-10-03 NOTE — Telephone Encounter (Signed)
Called patient to discuss ultrasound results. No answer. Left voicemail but did not discuss results.  She will definitely need an endometrial biopsy. I had asked her to call the clinic the schedule this at the GYN clinic, I am not sure if she has done this.   Will try calling back tomorrow.   Smitty Cords, MD [place], PGY-3

## 2017-10-04 DIAGNOSIS — N950 Postmenopausal bleeding: Secondary | ICD-10-CM | POA: Insufficient documentation

## 2017-10-04 DIAGNOSIS — E113513 Type 2 diabetes mellitus with proliferative diabetic retinopathy with macular edema, bilateral: Secondary | ICD-10-CM | POA: Diagnosis not present

## 2017-10-04 NOTE — Assessment & Plan Note (Signed)
Vaginal bleeding x 2 weeks. Has not had a period in 2 years. Vitals normal here. POC hemoglobin normal at 12.9. Concerned for fibroids versus endometrial carcinoma.  - Urgent pelvic transvaginal U/S - Front office closed as patient was the last patient of the day, asked her to call and schedule an appointment for an endometrial biopsy with the GYN clinic

## 2017-10-04 NOTE — Telephone Encounter (Signed)
LMOVM for pt to return call to schedule. Lamija Besse, [name], CMA

## 2017-10-04 NOTE — Assessment & Plan Note (Signed)
Acute on chronic cough. Likely multifactorial, hx of asthma, OSA, and recent URI. Is non compliant with CPAP. Lungs are clear to auscultation, O2 99% on room air.  - Discussed wearing CPAP - Discussed using albuterol every 4 hours for the next 24 hours - Phenergan with codeine given for post tussive emesis - Return in 2 weeks

## 2017-10-05 NOTE — Telephone Encounter (Signed)
Pt returned dr Collene Leyden call.  She would like to talk to dr about her results. appt was scheduled for Monday 10-08-17 for gambino and 10-18-17 for colpo.

## 2017-10-08 ENCOUNTER — Encounter: Payer: Self-pay | Admitting: Family Medicine

## 2017-10-08 ENCOUNTER — Ambulatory Visit: Payer: Self-pay | Admitting: Family Medicine

## 2017-10-08 ENCOUNTER — Other Ambulatory Visit: Payer: Self-pay

## 2017-10-08 ENCOUNTER — Ambulatory Visit (INDEPENDENT_AMBULATORY_CARE_PROVIDER_SITE_OTHER): Payer: Medicare Other | Admitting: Family Medicine

## 2017-10-08 VITALS — BP 118/64 | HR 88 | Temp 98.3°F | Ht 64.0 in | Wt 236.0 lb

## 2017-10-08 DIAGNOSIS — E118 Type 2 diabetes mellitus with unspecified complications: Secondary | ICD-10-CM

## 2017-10-08 DIAGNOSIS — Z794 Long term (current) use of insulin: Secondary | ICD-10-CM | POA: Diagnosis not present

## 2017-10-08 DIAGNOSIS — N950 Postmenopausal bleeding: Secondary | ICD-10-CM

## 2017-10-08 DIAGNOSIS — N939 Abnormal uterine and vaginal bleeding, unspecified: Secondary | ICD-10-CM

## 2017-10-08 LAB — POCT HEMOGLOBIN: Hemoglobin: 12.4 g/dL (ref 12.2–16.2)

## 2017-10-08 MED ORDER — ZOSTER VAC RECOMB ADJUVANTED 50 MCG/0.5ML IM SUSR: 0.5000 mL | Freq: Once | INTRAMUSCULAR | 0 refills | Status: AC

## 2017-10-08 NOTE — Telephone Encounter (Signed)
Pharmacy left another message on nurse line requesting that Tizanidine capsules be re-prescribed as Tizanidine TABLETS which is the preferred alternative. Danley Danker, RN [facility] RN)

## 2017-10-08 NOTE — Progress Notes (Signed)
Subjective:    Patient ID: Tamara Henry , female   DOB: [birth year] , 57 y.o..   MRN: [id number]  HPI  Tamara Henry is 57 yo F with PMH ofCAD s/p CABG, PE, HTN, T2DM, CKD III, OSA on CPAP, Asthma here for  Chief Complaint  Patient presents with  . Discuss Results   1. Vaginal bleeding:  Was seen last on January 22 for 2 weeks of vaginal bleeding.  She noted that her flow was heavier.  Previously she had not had a period in a couple years.  She was advised to stop Xarelto, she stopped taking it on January 23..  A pelvic and transvaginal ultrasound was performed which showed some endometrial thickening and a possible fibroid.  Patient is still bleeding a small amount.  She notes that the bleeding is "trickles".    2. Chronic Diabetes Disease Monitoring  Blood Sugar Ranges: 130-200 in the mornings  Polyuria: no   Visual problems: no   Last hemoglobin A1C:  Lab Results  Component Value Date   HGBA1C 6.8 10/02/2017    Medication Compliance: no  Medication Side Effects  Hypoglycemia: yes: She endorses glucoses in the 60's about twice a week  [place] Exam: Follows with ophthalmologist  Foot Exam: Up-to-date  Diet pattern: Trying to eliminate sugary beverages and sweets  Exercise: Minimal  Review of Systems: Per HPI.    Past Medical History: Patient Active Problem List   Diagnosis Date Noted  . Vaginal bleeding 10/09/2017  . Postmenopausal vaginal bleeding 10/04/2017  . Cough 10/04/2017  . Diarrhea 08/04/2017  . Arm pain, anterior, right 06/17/2017  . OSA on CPAP 05/03/2017  . Morbid obesity due to excess calories ([place]) 05/03/2017  . Microscopic hematuria 03/27/2017  . Impingement syndrome of left shoulder 02/19/2017  . Hypersomnia 12/12/2016  . REM behavioral disorder 12/12/2016  . Asthma 12/12/2016  . Neck pain 11/17/2016  . CKD (chronic kidney disease), stage III ([place]) 11/02/2016  . CAD (coronary artery disease), native coronary artery  10/26/2016  . Poor social situation 10/13/2016  . Leg pain 08/30/2016  . Bilateral low back pain without sciatica 08/30/2016  . 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 Bilateral pulmonary embolism ([place]) 05/17/2016  . Hyperlipidemia 05/17/2016  . Depression 05/17/2016  . Diabetic peripheral neuropathy ([place]) 05/17/2016  . Neuritis of upper extremity, C7 01/15/2015  . Diabetes ([place]) 01/14/2015  . Hypertension 01/14/2015  . Numbness and tingling of right arm 01/14/2015    Medications: reviewed   Social Hx:  reports that she quit smoking about 12 years ago. Her smoking use included cigarettes. She has a 20.00 pack-year smoking history. she has never used smokeless tobacco.   Objective:   BP 118/64   Pulse 88   Temp 98.3 F (36.8 C) (Oral)   Ht 5\' 4"  (1.626 m)   Wt 236 lb (107 kg)   SpO2 97%   BMI 40.51 kg/m  Physical Exam  Gen: NAD, alert, cooperative with exam, well-appearing Psych: good insight, normal mood and affect  Assessment & Plan:  Diabetes ([place]) Controlled per last A1C of 6.8.  Congratulated patient on lowering her A1c.  She has had a few episodes of hypoglycemia into the 60s, this happens about twice a week -Continue Lantus 50 units daily -Decrease NovoLog to 3 units twice daily -If patient is to have any hypoglycemia she will notify us -Continue a low sugar low carbohydrate diet -Patient is going to join the [facility] for exercise -  Follow-up for recheck A1c in 3 months  Vaginal bleeding She recently seen for vaginal bleeding in the setting of being postmenopausal and not having a period in the last few years. POC  hemoglobin today unchanged from previous. A pelvic and transvaginal ultrasound was ordered which showed endometrial thickening and a 2.6 cm mass in lower uterine segment, suspicious for an intracavitary or submucosal fibroid, with endometrial polyp considered less likely.  She has stopped her Xarelto and bleeding is now just a "trickle". -Patient to follow-up with  gynecology clinic next week for endometrial biopsy  Orders Placed This Encounter  Procedures  . Hemoglobin   Meds ordered this encounter  Medications  . Zoster Vaccine Adjuvanted [facility]) injection    Sig: Inject 0.5 mLs into the muscle once for 1 dose.    Dispense:  0.5 mL    Refill:  0    Smitty Cords, MD [place], PGY-3

## 2017-10-08 NOTE — Patient Instructions (Signed)
Thank you for coming in today, it was so nice to see you! Today we talked about:    Vaginal bleeding: I'm glad this has slowed down! Your hemoglobin is still normal which is good. Please follow up with the gynecology clinic for your biopsy  Diabetes: Congratulations on your low A1C!! Very proud of you. You can go down to 3 units of novolog twice a day, go back up to 5 units if your sugar is more than 200  I have given you a prescription for the shingles vaccine  Please follow up in 3-4 weeks. You can schedule this appointment at the front desk before you leave or call the clinic.  Bring in all your medications or supplements to each appointment for review.   If we ordered any tests today, you will be notified via telephone of any abnormalities. If everything is normal you will get a letter in the mail.   If you have any questions or concerns, please do not hesitate to call the office at [phone]. You can also message me directly via MyChart.   Sincerely,  Smitty Cords, MD

## 2017-10-08 NOTE — Assessment & Plan Note (Addendum)
Controlled per last A1C of 6.8.  Congratulated patient on lowering her A1c.  She has had a few episodes of hypoglycemia into the 60s, this happens about twice a week -Continue Lantus 50 units daily -Decrease NovoLog to 3 units twice daily -If patient is to have any hypoglycemia she will notify us -Continue a low sugar low carbohydrate diet -Patient is going to join the [facility] for exercise - Follow-up for recheck A1c in 3 months

## 2017-10-09 ENCOUNTER — Ambulatory Visit (INDEPENDENT_AMBULATORY_CARE_PROVIDER_SITE_OTHER): Payer: Medicare Other | Admitting: Podiatry

## 2017-10-09 ENCOUNTER — Encounter: Payer: Self-pay | Admitting: Podiatry

## 2017-10-09 DIAGNOSIS — N939 Abnormal uterine and vaginal bleeding, unspecified: Secondary | ICD-10-CM | POA: Insufficient documentation

## 2017-10-09 DIAGNOSIS — B351 Tinea unguium: Secondary | ICD-10-CM

## 2017-10-09 DIAGNOSIS — M79674 Pain in right toe(s): Secondary | ICD-10-CM

## 2017-10-09 DIAGNOSIS — E1149 Type 2 diabetes mellitus with other diabetic neurological complication: Secondary | ICD-10-CM

## 2017-10-09 DIAGNOSIS — M205X2 Other deformities of toe(s) (acquired), left foot: Secondary | ICD-10-CM

## 2017-10-09 DIAGNOSIS — M79675 Pain in left toe(s): Secondary | ICD-10-CM | POA: Diagnosis not present

## 2017-10-09 MED ORDER — TIZANIDINE HCL 4 MG PO TABS
4.0000 mg | ORAL_TABLET | Freq: Four times a day (QID) | ORAL | 0 refills | Status: DC | PRN
Start: 2017-10-09 — End: 2017-12-25

## 2017-10-09 NOTE — Progress Notes (Signed)
Complaint:  Visit Type: Patient returns to my office for continued preventative foot care services. Complaint: Patient states" my nails have grown long and thick and become painful to walk and wear shoes" Patient has been diagnosed with DM with  foot neuropathy. The patient presents for preventative foot care services. No changes to ROS.  Patient says the orthoses made by Liliane Channel in her diabetic orthoses have not helped her left foot.  Pain continues.  Podiatric Exam: Vascular: dorsalis pedis and posterior tibial pulses are palpable bilateral. Capillary return is immediate. Temperature gradient is WNL. Skin turgor WNL  Sensorium: Diminished  Semmes Weinstein monofilament test. Normal tactile sensation bilaterally. Nail Exam: Pt has thick disfigured discolored nails with subungual debris noted bilateral entire nail hallux through fifth toenails Ulcer Exam: There is no evidence of ulcer or pre-ulcerative changes or infection. Orthopedic Exam: Muscle tone and strength are WNL. No limitations in general ROM. No crepitus or effusions noted. Functional hallux limitus 1st MPJ  B/L. Skin: No Porokeratosis. No infection or ulcers  Diagnosis:  Onychomycosis, , Pain in right toe, pain in left toes  Treatment & Plan Procedures and Treatment: Consent by patient was obtained for treatment procedures.   Debridement of mycotic and hypertrophic toenails, 1  bilateral and clearing of subungual debris. No ulceration, no infection noted. Debride callus on distal aspect hallux  B/L.  Patient needs an appointment with Liliane Channel.  ABN signed for 2018. Patient had Liliane Channel reevaluate her orthoses in addition to adding cutout right orthoses. Return Visit-Office Procedure: Patient instructed to return to the office for a follow up visit 3 months for continued evaluation and treatment.    Gardiner Barefoot DPM

## 2017-10-09 NOTE — Assessment & Plan Note (Addendum)
She recently seen for vaginal bleeding in the setting of being postmenopausal and not having a period in the last few years. POC  hemoglobin today unchanged from previous. A pelvic and transvaginal ultrasound was ordered which showed endometrial thickening and a 2.6 cm mass in lower uterine segment, suspicious for an intracavitary or submucosal fibroid, with endometrial polyp considered less likely.  She has stopped her Xarelto and bleeding is now just a "trickle". -Patient to follow-up with gynecology clinic next week for endometrial biopsy

## 2017-10-09 NOTE — Telephone Encounter (Signed)
Refilled as tablets

## 2017-10-15 DIAGNOSIS — G4733 Obstructive sleep apnea (adult) (pediatric): Secondary | ICD-10-CM | POA: Diagnosis not present

## 2017-10-16 ENCOUNTER — Encounter: Payer: Self-pay | Admitting: Psychology

## 2017-10-16 DIAGNOSIS — Z659 Problem related to unspecified psychosocial circumstances: Secondary | ICD-10-CM

## 2017-10-16 NOTE — Assessment & Plan Note (Signed)
Despite Tamara Henry' tearfulness, she was naming (unprompted) several positive things.  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  She is also trying to remember to breathe - something she says she learned from [employer].  While she is overwhelmed, her thinking does not seem catastrophic or overly negative in nature.    Goal today was to provide support and reinforce the positive things she is doing.  Talked about "doing hard things" and not looking too far ahead.  She was able to map out her next best step.  She was able to laugh near the end of our visit and left with a plan.  I suspect the stress will push her again to an uncomfortable place.  She does have social support but could use more.  I encouraged her to schedule with Neoma Laming and also access [facility] when in the hospital if she thought that might be helpful to her.

## 2017-10-16 NOTE — Progress Notes (Signed)
Tamara Henry at the front desk requested a [employer].   Presenting Issue:  Tamara Henry was at the front desk - thinking she had an appointment today - and was tearful.    Report of symptoms:  She reports she is overwhelmed with some chronic and acute stressors.  Her 55 y.o. Daughter is in the hospital with "fluid on the brain."  May need a shunt.  She is very worried about her.  She is also taking care of her daughter's five children (ages 52-5) since she is in the hospital.  The children are not behaving well.  Duration of CURRENT symptoms:  Patient's daughter has been struggling with this health issue for some time.  Tamara Henry was at the end of her rope on Sunday with the kids and then her daughter was hospitalized.  Sounds like the stress has been going on for at least weeks.    Age of onset of first mood disturbance:  Did not assess.  Impact on function:  Having trouble concentrating.  Tearful.    Psychiatric History:  Did not assess.  Warmhandoff:    Warm Hand Off Completed.

## 2017-10-17 ENCOUNTER — Ambulatory Visit: Payer: Medicare Other

## 2017-10-18 ENCOUNTER — Other Ambulatory Visit: Payer: Self-pay

## 2017-10-18 ENCOUNTER — Telehealth: Payer: Self-pay | Admitting: Family Medicine

## 2017-10-18 ENCOUNTER — Ambulatory Visit (INDEPENDENT_AMBULATORY_CARE_PROVIDER_SITE_OTHER): Payer: Medicare Other | Admitting: Family Medicine

## 2017-10-18 VITALS — BP 110/63 | HR 70 | Temp 98.2°F | Wt 240.0 lb

## 2017-10-18 DIAGNOSIS — N939 Abnormal uterine and vaginal bleeding, unspecified: Secondary | ICD-10-CM

## 2017-10-18 NOTE — Telephone Encounter (Signed)
Clinical info completed on Transportation form.  Place form in Dr. Thad Ranger box for completion.  Katharina Caper, Tamasha Laplante D, [place]

## 2017-10-18 NOTE — Telephone Encounter (Signed)
Transportation and handicap plackard form dropped off for at front desk for completion.  Verified that patient section of form has been completed.  Last DOS/WCC with PCP was 10/08/17.  Placed form in team folder to be completed by clinical staff.  Crista Luria

## 2017-10-18 NOTE — Progress Notes (Signed)
    CHIEF COMPLAINT / HPI: 57 year old African-American female had been without vaginal bleeding for almost 1 year when she experienced return of vaginal bleeding.  Notably she had been placed on Xarelto 2017  for bilateral pulmonary embolism.  She has since been discontinued on Xarelto.  Her pelvic ultrasound was performed which showed both intramural and submucosal fibroid.  See report below.  Presents today for discussion of endometrial biopsy.  REVIEW OF SYSTEMS: No current bleeding.  PERTINENT  PMH / PSH: I have reviewed the patient's medications, allergies, past medical and surgical history, smoking status and updated in the EMR as appropriate. 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  IMAGING:Measurements: 13.5 x 5.1 x 5.2 cm. Diffusely heterogeneous myometrial echotexture. Probable intramural fibroid in the anterior corpus measuring 2.8 cm. A 2.6 cm hypoechoic mass is also seen in the lower uterine corpus, which involves the endometrial cavity and is suspicious for an intracavitary or submucosal fibroid.  Endometrium  Thickness: 17 mm. Incompletely visualized due to probable fibroid in lower uterine segment  OBJECTIVE: Well-developed female no acute distress  ASSESSMENT / PLAN: Please see problem oriented charting for details

## 2017-10-18 NOTE — Assessment & Plan Note (Addendum)
Review of pelvic ultrasound reveals submucosal and intramural fibroid with a 17 mm estimated endometrial stripe in a patient who was without bleeding for 11 months, thus not totally postmenopausal.  Created by use of Xarelto which has been discontinued.  Discussed options with her today.    I do think she needs an endometrial biopsy.  Also think she might benefit from sonohysterography.  Probably also needs a Pap. I discussed with her today.  I am uncertain that we would be able to get an adequate sample of her endometrium and I think she also needs additional testing so after discussion we decided to refer her to gynecology.  I will ask GYN provider to get her in as quickly as possible.  Greater than 50% of our 25 minute office visit was spent in counseling and education regarding these issues.

## 2017-10-19 ENCOUNTER — Encounter: Payer: Self-pay | Admitting: Obstetrics & Gynecology

## 2017-10-19 ENCOUNTER — Telehealth: Payer: Self-pay | Admitting: Family Medicine

## 2017-10-19 NOTE — Telephone Encounter (Signed)
**  After Hours/ Emergency Line Call*  Received a call from [name]. Called back and no answer.   Carlyle Dolly, MD PGY-3, [facility]

## 2017-10-23 NOTE — Telephone Encounter (Signed)
Form faxed to [place] at [phone]. Patient aware. Form placed to be scanned. Danley Danker, RN [facility] RN)

## 2017-10-23 NOTE — Telephone Encounter (Signed)
Filled form out, placed in RN box.   Called patient to discuss her forms. She was asking for [employer] as well as a handicap sticker for her car. She endorsed difficulty seeing secondary to glaucoma, discussed it would not be safe for her to drive with this. Filled out [employer] and not handicap sticker for her car as it would not be safe for her to drive. She was appreciative of the call.   Smitty Cords, MD [place], PGY-3

## 2017-11-01 ENCOUNTER — Telehealth: Payer: Self-pay | Admitting: Family Medicine

## 2017-11-01 NOTE — Telephone Encounter (Signed)
Pt needs Rx for her test strips sent to [facility].  She uses one touch vario.  Please advise

## 2017-11-02 MED ORDER — GLUCOSE BLOOD VI STRP: ORAL_STRIP | 3 refills | Status: DC

## 2017-11-02 NOTE — Telephone Encounter (Signed)
Refill sent.

## 2017-11-06 NOTE — Telephone Encounter (Signed)
Pt calling saying the pharmacy got everything but the refill for the test strips. They never received a refill for the onetouch vario test strips. Please resend

## 2017-11-07 MED ORDER — GLUCOSE BLOOD VI STRP: ORAL_STRIP | 3 refills | Status: DC

## 2017-11-07 NOTE — Addendum Note (Signed)
Addended by: Carlyle Dolly on: 11/07/2017 12:22 PM   Modules accepted: Orders

## 2017-11-07 NOTE — Telephone Encounter (Signed)
Sent rx again

## 2017-11-12 DIAGNOSIS — G4733 Obstructive sleep apnea (adult) (pediatric): Secondary | ICD-10-CM | POA: Diagnosis not present

## 2017-11-13 ENCOUNTER — Other Ambulatory Visit: Payer: Self-pay

## 2017-11-13 MED ORDER — GABAPENTIN 300 MG PO CAPS: ORAL_CAPSULE | ORAL | 2 refills | Status: DC

## 2017-11-13 NOTE — Telephone Encounter (Signed)
Pt requesting refill on gabapentin be sent to [facility]. Pt call back [phone] Wallace Cullens, RN

## 2017-11-14 ENCOUNTER — Other Ambulatory Visit: Payer: Self-pay | Admitting: Family Medicine

## 2017-11-17 IMAGING — CR DG CHEST 2V
2 series · 2 of 2 positions shown · non-contrast
Comparison: 05/17/2016 chest radiograph.

CLINICAL DATA: 55 y/o F; left-sided chest pain radiating down the
left arm for 2 hours. Shortness of breath.

EXAM:
CHEST  2 VIEW

[chest pa]
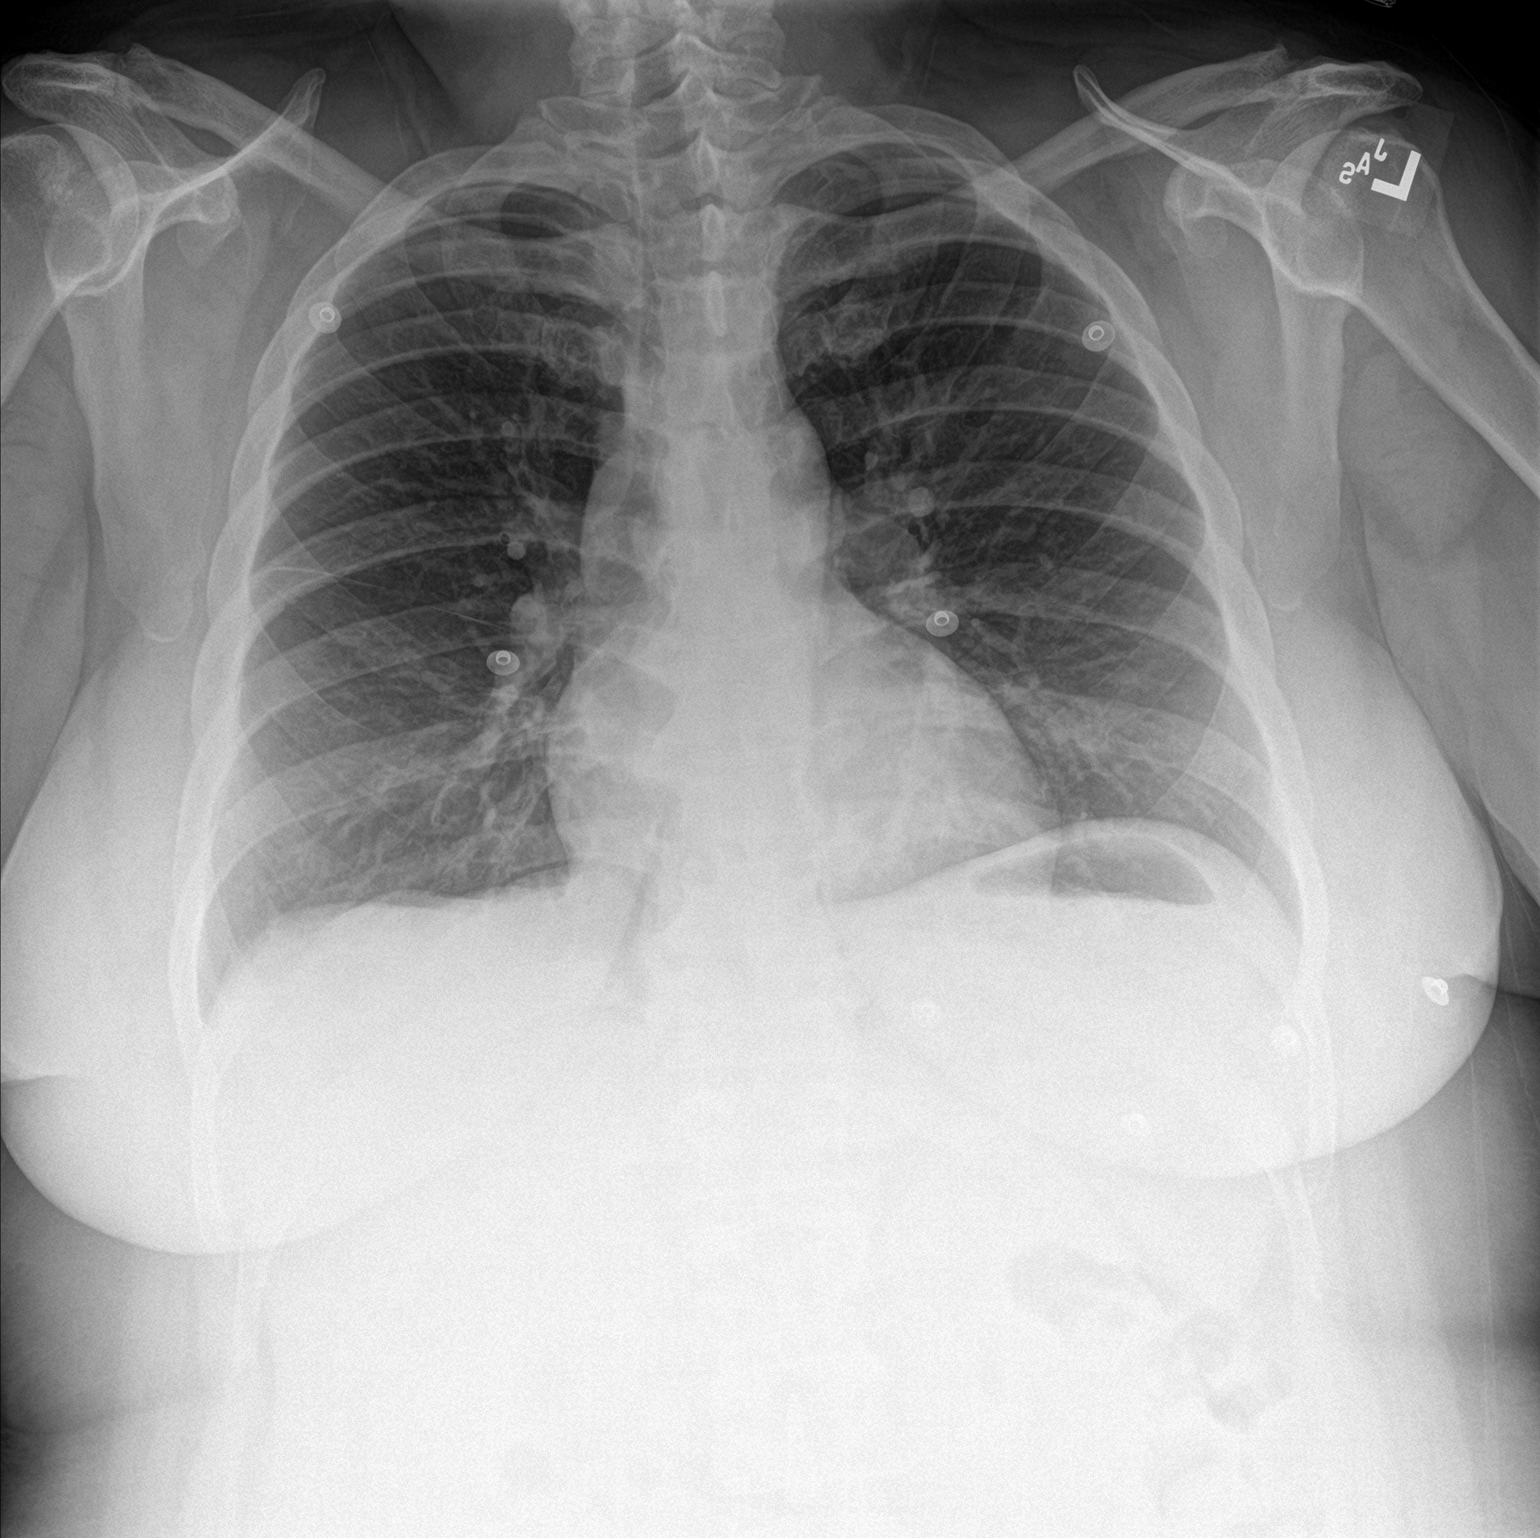

[chest lat]
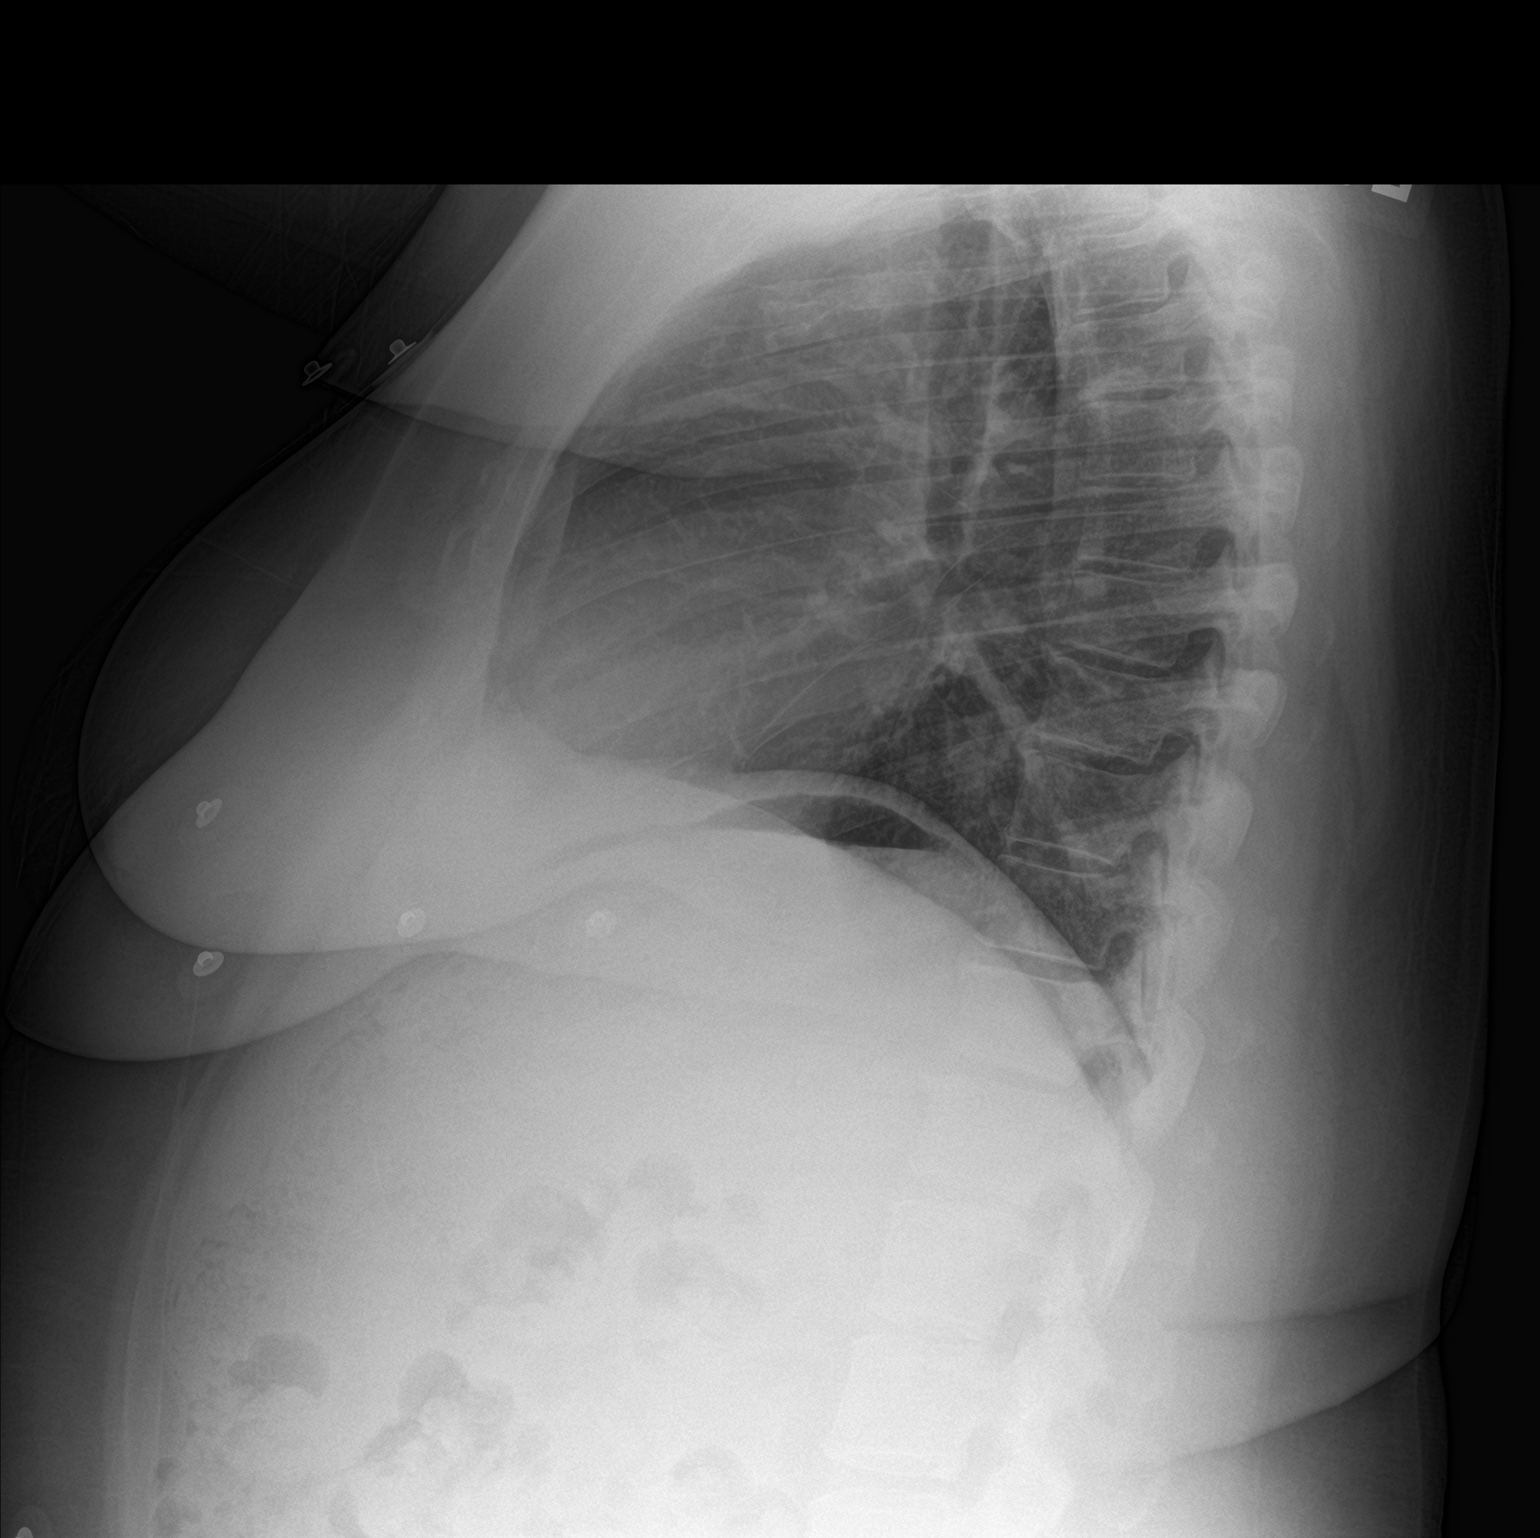

[2 of 2 positions shown; findings below may reference images not displayed]

FINDINGS: The heart size and mediastinal contours are within normal limits and
stable. Both lungs are clear. The visualized skeletal structures are
unremarkable.
IMPRESSION: No active cardiopulmonary disease.

By: Cheikh Fridman M.D.

## 2017-11-19 ENCOUNTER — Encounter: Payer: Self-pay | Admitting: Obstetrics & Gynecology

## 2017-11-19 ENCOUNTER — Other Ambulatory Visit (HOSPITAL_COMMUNITY)
Admission: RE | Admit: 2017-11-19 | Discharge: 2017-11-19 | Disposition: A | Discharge status: Home / Self Care | Payer: Medicare Other | Source: Ambulatory Visit | Attending: Obstetrics & Gynecology | Admitting: Obstetrics & Gynecology

## 2017-11-19 ENCOUNTER — Ambulatory Visit (INDEPENDENT_AMBULATORY_CARE_PROVIDER_SITE_OTHER): Payer: Self-pay | Admitting: Clinical

## 2017-11-19 ENCOUNTER — Ambulatory Visit (INDEPENDENT_AMBULATORY_CARE_PROVIDER_SITE_OTHER): Payer: Medicare Other | Admitting: Obstetrics & Gynecology

## 2017-11-19 VITALS — BP 152/73 | HR 92 | Wt 232.0 lb

## 2017-11-19 DIAGNOSIS — Z01419 Encounter for gynecological examination (general) (routine) without abnormal findings: Secondary | ICD-10-CM

## 2017-11-19 DIAGNOSIS — F419 Anxiety disorder, unspecified: Secondary | ICD-10-CM | POA: Diagnosis not present

## 2017-11-19 DIAGNOSIS — F329 Major depressive disorder, single episode, unspecified: Secondary | ICD-10-CM

## 2017-11-19 DIAGNOSIS — N939 Abnormal uterine and vaginal bleeding, unspecified: Secondary | ICD-10-CM | POA: Diagnosis not present

## 2017-11-19 DIAGNOSIS — F4323 Adjustment disorder with mixed anxiety and depressed mood: Secondary | ICD-10-CM

## 2017-11-19 MED ORDER — MEGESTROL ACETATE 40 MG PO TABS: 40.0000 mg | ORAL_TABLET | Freq: Two times a day (BID) | ORAL | 3 refills | Status: DC

## 2017-11-19 NOTE — Progress Notes (Signed)
Pt reports depression and anxiety r/t illness and ICU hospitalization of 2 children. See IBH flow sheet. She would like to speak with Roselyn Reef. Dr Roselie Awkward aware.

## 2017-11-19 NOTE — BH Specialist Note (Signed)
Integrated Behavioral Health Initial Visit  MRN: [id number] Name: Tamara Henry  Number of [place] Clinician visits:: 1/6 Session Start time: 4:27  Session End time: 5:04 Total time: 30 minutes  Type of Service: [place] Interpretor:No. Interpretor Name and Language: n/a   Warm Hand Off Completed.       SUBJECTIVE: Tamara Henry is a 57 y.o. female accompanied by n/a Patient was referred by Dr Roselie Awkward for anxiety and depression. Patient reports the following symptoms/concerns: Pt states her primary concern today is finding out today that [employer] bus stops one mile away from her home, along with an interest in finding her own housing. Pt also has an increase in worry, stress, depression, after dealing with her own heart attack, as well as caring for her four grandchildren while her daughter was in ICU; her grown son was in ICU at the same time in [place].  Pt is being treated with medication via her PCP, and has received therapy from [facility] in the past. Pt copes best by going to the [facility], and to choir practice at church, but fears the lack of [employer] transportation will be a barrier. Pt self-reports PTSD from years ago, and is thinking about additional treatment options.   Duration of problem: Increase today (with transportation issue); Severity of problem: severe  OBJECTIVE: Mood: Anxious and Depressed and Affect: Tearful Risk of harm to self or others: No plan to harm self or others  LIFE CONTEXT: Family and Social: Pt lives with adult daughter, and 4 grandchildren(5,7,9,14); has three other adult children School/Work: On disability Self-Care: Therapist, music and [employer]  Life Changes: Two adult children in ICU([place] and [place]) in the past month, along with her heart attack, and upcoming surgery. Most recent change was car breakdown, and finding [employer] will be unable to pick her up at her home.   GOALS ADDRESSED: Patient  will: 1. Reduce symptoms of: anxiety, depression and stress 2. Increase knowledge and/or ability of: self-management skills and stress reduction  3. Demonstrate ability to: Increase healthy adjustment to current life circumstances and Increase adequate support systems for patient/family  INTERVENTIONS: Interventions utilized: Mindfulness or Psychologist, educational, Psychoeducation and/or Health Education and Link to [employer]  Standardized Assessments completed: GAD-7 and PHQ 9  ASSESSMENT: Patient currently experiencing Adjustment disorder with mixed anxiety and depressed mood.   Patient may benefit from psychoeducation and brief therapeutic interventions regarding coping with symptoms of anxiety  And depression.  PLAN: 1. Follow up with behavioral health clinician on : Next medical appointment at [facility] or Family Medicine 2. Behavioral recommendations:  -Talk to PCP about any changes to [facility] medication -Consider [facility] [place] or [place] for additional [facility] services -Consider CALM relaxation breathing exercise every morning and evening  -Consider apps for additional self-care -Continue listening to audible books -Consider inquiring at new church about obtaining rides to and from choir practice weekly -Read educational materials regarding coping with symptoms of anxiety and depression  3. Referral(s): Integrated Orthoptist (In Clinic) and [employer]:  Housing and Transportation 4. "From scale of 1-10, how likely are you to follow plan?": -  Garlan Fair, LCSW   Depression screen [facility] 2/9 11/19/2017 10/18/2017 10/08/2017 08/29/2017 07/30/2017  Decreased Interest 3 0 0 0 0  Down, Depressed, Hopeless 3 0 0 0 0  PHQ - 2 Score 6 0 0 0 0  Altered sleeping 3 - - 3 -  Tired, decreased energy 3 - -  0 -  Change in appetite 3 - - 0 -  Feeling bad or failure about yourself  1 - - 0 -  Trouble concentrating 3 - - 0 -  Moving  slowly or fidgety/restless 0 - - 0 -  Suicidal thoughts 0 - - 0 -  PHQ-9 Score 19 - - 3 -  Difficult doing work/chores - - - Not difficult at all -  Some recent data might be hidden   GAD 7 : Generalized Anxiety Score 11/19/2017 08/29/2017 07/25/2017  Nervous, Anxious, on Edge 3 0 0  Control/stop worrying 3 0 3  Worry too much - different things 3 0 3  Trouble relaxing 3 0 1  Restless 3 0 1  Easily annoyed or irritable 3 0 1  Afraid - awful might happen 3 0 3  Total GAD 7 Score 21 0 12  Anxiety Difficulty - - Somewhat difficult

## 2017-11-19 NOTE — Progress Notes (Signed)
Patient ID: Tamara Henry, female   DOB: Oct 29, 1960, 57 y.o.   MRN: [id number]  Chief Complaint  Patient presents with  . Vaginal Bleeding    HPI Tamara Henry is a 57 y.o. female.  [id number] No LMP recorded (lmp unknown). Patient is perimenopausal. She is referred by Dr Nori Riis due to postmenopausal bleeding and an abnormal pelvic US. She had been without vaginal bleeding for almost 1 year when she experienced return of vaginal bleeding.ith irregular heavy menses  Notably she had been placed on Xarelto 2017  for bilateral pulmonary embolism.  She has since been discontinued on Xarelto.  Her pelvic ultrasound was performed which showed both intramural and submucosal fibroid.    HPI  Past Medical History:  Diagnosis Date  . Acid reflux   . Allergy    seasonal  . Anxiety   . Arthritis    back  . Asthma   . Back pain   . Bilateral pulmonary embolism ([place]) 05/2016  . Bipolar disorder ([place])   . CAD in native artery    S/P NSTEMI with cath showing severe 3 vessel ASCAD s/p CABGx4 07/28/16.  . Cataracts, bilateral   . CKD (chronic kidney disease), stage III (HCC)    borderline CKD II-III  . Cocaine abuse ([place])    In remission. Stopped using in early 2000's  . Depression   . Diabetes mellitus ([place])    Type II  . GERD (gastroesophageal reflux disease)   . Glaucoma   . High cholesterol   . History of blood transfusion   . History of pulmonary embolus (PE)   . 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 Hx of chest tube placement right   . Hypertension   . Morbid obesity ([place])   . Myocardial infarction ([place])   . Neuropathy    hands and feet  . Pneumonia   . Postoperative anemia 07/2016  . PTSD (post-traumatic stress disorder)   . Sleep apnea    cpap  . Stroke [facility]) 2005   no residual    Past Surgical History:  Procedure Laterality Date  . CARDIAC CATHETERIZATION N/A 07/25/2016   Procedure: Left Heart Cath and Coronary Angiography;  Surgeon: Peter M Martinique, MD;  Location: [place] CV LAB;   Service: Cardiovascular;  Laterality: N/A;  . COLONOSCOPY    . COLONOSCOPY W/ POLYPECTOMY    . CORONARY ARTERY BYPASS GRAFT N/A 07/28/2016   Procedure: CORONARY ARTERY BYPASS GRAFTING (CABG)x4 using left internal mammary and endoscopic harvest of right greater saphenous vein;  Surgeon: Ivin Poot, MD;  Location: [place];  Service: Open Heart Surgery;  Laterality: N/A;  . DENTAL SURGERY     to removed broken teeth  . POLYPECTOMY    . PULMONARY EMBOLISM SURGERY    . SHOULDER ARTHROSCOPY WITH ROTATOR CUFF REPAIR Left 02/19/2017   Procedure: LEFT SHOULDER ARTHROSCOPY with debridement andROTATOR CUFF REPAIR;  Surgeon: [name], MD;  Location: [place];  Service: Orthopedics;  Laterality: Left;  . TEE WITHOUT CARDIOVERSION N/A 07/28/2016   Procedure: TRANSESOPHAGEAL ECHOCARDIOGRAM (TEE);  Surgeon: Ivin Poot, MD;  Location: [place];  Service: Open Heart Surgery;  Laterality: N/A;  . TUBAL LIGATION      Family History  Problem Relation Age of Onset  . Pancreatitis Mother   . Diabetes type II Mother   . Diabetes Mother   . CAD Father   . Heart attack Father   . Diabetes type II Sister   . Diabetes Sister   .  Diabetes type II Brother   . Diabetes Brother   . Diabetes Brother   . Diabetes Brother   . Clotting disorder Son        heart attack x 3   . Breast cancer Maternal Aunt   . Colon cancer Neg Hx   . Stomach cancer Neg Hx     Social History Social History   Tobacco Use  . Smoking status: Former Smoker    Packs/day: 2.00    Years: 10.00    Pack years: 20.00    Types: Cigarettes    Last attempt to quit: 09/11/2005    Years since quitting: 12.1  . Smokeless tobacco: Never Used  Substance Use Topics  . Alcohol use: No  . Drug use: No    Allergies  Allergen Reactions  . Lactose Intolerance (Gi) Diarrhea  . Lisinopril Other (See Comments) and Cough    Inflammation, coughing  . Reglan [Metoclopramide] Other (See Comments)    REACTION IS SIDE EFFECT Pt stated having  lock jaw as a side effect  . Wellbutrin [Bupropion] Nausea And Vomiting and Cough  . Latex Rash  . Metformin And Related Diarrhea  . Penicillins Rash    [FROM PREVIOUS ENTRY-BEFORE 07/27/16] >>"ALL CILLINS"  PATIENT HAD A PCN REACTION WITH IMMEDIATE RASH, FACIAL/TONGUE/THROAT SWELLING, SOB, OR LIGHTHEADEDNESS WITH HYPOTENSION:  #  #  #  YES  #  #  #   Has patient had a PCN reaction causing severe rash involving mucus membranes or skin necrosis: No Has patient had a PCN reaction that required hospitalization No Has patient had a PCN reaction occurring within the last 10 years:   #  #  #  YES  #  #  #   . Sulfa Antibiotics Itching  . Tape Rash    Current Outpatient Medications  Medication Sig Dispense Refill  . amLODipine (NORVASC) 10 MG tablet Take 1 TABLET BY MOUTH daily 28 tablet 3  . aspirin 81 MG EC tablet Take 1 tablet (81 mg total) by mouth daily.    . calcipotriene (DOVONOX) 0.005 % cream Apply 1 application as needed topically.    . Cholecalciferol (VITAMIN D-1000 MAX ST) 1000 units tablet Take 1,000 Units daily by mouth.    . citalopram (CELEXA) 20 MG tablet Take 1 tablet (20 mg total) by mouth daily *Morning* **emergency fill 08/03/2017** 28 tablet 6  . diphenoxylate-atropine (LOMOTIL) 2.5-0.025 MG tablet Take 1 tablet by mouth 4 (four) times daily as needed for diarrhea or loose stools. Take 1-2 tablets up to 4 times daily. 30 tablet 0  . ezetimibe (ZETIA) 10 MG tablet Take 10 mg daily by mouth.  1  . ferrous sulfate 325 (65 FE) MG tablet Take 1 tablet (325 mg total) by mouth daily with breakfast. 30 tablet 3  . folic acid (FOLVITE) 768 MCG tablet Take 400 mcg by mouth daily.    . furosemide (LASIX) 40 MG tablet Take 1 TABLET BY MOUTH daily 28 tablet 3  . gabapentin (NEURONTIN) 300 MG capsule Take 3 capsules BY MOUTH EVERY MORNING, Take 2 capsules BY MOUTH at lunch, and take 3 capsules BY MOUTH at bedtime 224 capsule 2  . glucose blood (ONETOUCH VERIO) test strip Check blood  sugar 6 x daily 200 each 3  . guaiFENesin (MUCINEX) 600 MG 12 hr tablet Take 2 tablets (1,200 mg total) 2 (two) times daily by mouth. 14 tablet 0  . hydrochlorothiazide (MICROZIDE) 12.5 MG capsule Take 12.5 mg daily by mouth.    Marland Kitchen  HYDROcodone-acetaminophen (NORCO/VICODIN) 5-325 MG tablet Take 1 tablet by mouth 3 (three) times daily as needed for moderate pain. 30 tablet 0  . insulin aspart (NOVOLOG) 100 UNIT/ML injection Inject 5 Units into the skin 2 (two) times daily before a meal. 10 mL 3  . Insulin Glargine (LANTUS SOLOSTAR) 100 UNIT/ML Solostar Pen Inject 50 Units daily at 10 pm into the skin. 5 pen PRN  . Melatonin 3 MG TABS Take 6 mg by mouth at bedtime.    . metoprolol tartrate (LOPRESSOR) 25 MG tablet Take 1 TABLET (25mg ) BY MOUTH TWICE DAILY *Morning&Evening* 56 tablet 2  . Multiple Vitamins-Minerals (MULTIVITAMIN) tablet Take 1 tablet by mouth daily. 30 tablet 5  . omeprazole (PRILOSEC) 40 MG capsule Take 1 capsule BY MOUTH daily 28 capsule 2  . pantoprazole (PROTONIX) 40 MG tablet Take 1 tablet (40 mg total) by mouth 2 (two) times daily before a meal. 90 tablet 3  . potassium chloride SA (K-DUR,KLOR-CON) 20 MEQ tablet take half a TABLET BY MOUTH EVERY MORNING 14 tablet 2  . Probiotic Product (ALIGN) 4 MG CAPS Take 1 capsule (4 mg total) by mouth daily. 30 capsule 2  . tiZANidine (ZANAFLEX) 4 MG tablet Take 1 tablet (4 mg total) by mouth every 6 (six) hours as needed for muscle spasms. 30 tablet 0  . Witch Hazel (PREPARATION H) 50 % PADS Apply 1 application 2 (two) times daily topically. 10 each 3  . atorvastatin (LIPITOR) 80 MG tablet Take 1 tablet (80 mg total) by mouth every evening. 90 tablet 3  . megestrol (MEGACE) 40 MG tablet Take 1 tablet (40 mg total) by mouth 2 (two) times daily. 30 tablet 3   Current Facility-Administered Medications  Medication Dose Route Frequency Provider Last Rate Last Dose  . 0.9 %  sodium chloride infusion  500 mL Intravenous Once Nandigam, Venia Minks,  MD        Review of Systems Review of Systems  Constitutional: Negative.   Respiratory: Negative.   Cardiovascular: Negative.   Gastrointestinal: Negative.   Genitourinary: Positive for menstrual problem. Negative for pelvic pain, vaginal bleeding and vaginal discharge.    Blood pressure (!) 152/73, pulse 92, weight 232 lb (105.2 kg).  Physical Exam Physical Exam  Constitutional:  obese  Pulmonary/Chest: Effort normal.  midsternal scar  Abdominal: Soft. She exhibits no mass. There is no tenderness.  obese  Genitourinary: Vagina normal and uterus normal. No vaginal discharge found.  Genitourinary Comments: Pap done, no lesions  Psychiatric: She has a normal mood and affect. Her behavior is normal.  Vitals reviewed.   Data Reviewed CLINICAL DATA:  Vaginal bleeding for 2 weeks. Postmenopausal female. Morbid obesity.  EXAM: TRANSABDOMINAL AND TRANSVAGINAL ULTRASOUND OF PELVIS  TECHNIQUE: Both transabdominal and transvaginal ultrasound examinations of the pelvis were performed. Transabdominal technique was performed for global imaging of the pelvis including uterus, ovaries, adnexal regions, and pelvic cul-de-sac. It was necessary to proceed with endovaginal exam following the transabdominal exam to visualize the endometrium and ovaries.  COMPARISON:  None  FINDINGS: Technically difficult exam due to patient habitus and inability to tolerate transvaginal sonography well.  Uterus  Measurements: 13.5 x 5.1 x 5.2 cm. Diffusely heterogeneous myometrial echotexture. Probable intramural fibroid in the anterior corpus measuring 2.8 cm. A 2.6 cm hypoechoic mass is also seen in the lower uterine corpus, which involves the endometrial cavity and is suspicious for an intracavitary or submucosal fibroid.  Endometrium  Thickness: 17 mm. Incompletely visualized due to probable fibroid in  lower uterine segment.  Right ovary  Measurements: Not directly visualized,  however no adnexal mass identified.  Left ovary  Measurements: Not directly visualized, however no adnexal mass identified.  Other findings  No abnormal free fluid.  IMPRESSION: Abnormal endometrial thickening measuring 17 mm. In the setting of post-menopausal bleeding, endometrial sampling is indicated to exclude carcinoma.  2.6 cm mass in lower uterine segment, suspicious for an intracavitary or submucosal fibroid, with endometrial polyp considered less likely. Probable small anterior uterine fibroid also noted. Consider pelvic MRI without and with contrast for further evaluation.  Nonvisualization of ovaries, however no adnexal mass identified.   Electronically Signed   By: Earle Gell M.D.   On: 10/03/2017 16:18  Assessment    Postmenopausal bleeding with US showing intracavitary lesion possible fibroid Multiple medical problem Patient Active Problem List   Diagnosis Date Noted  . Vaginal bleeding 10/09/2017  . Postmenopausal vaginal bleeding 10/04/2017  . Cough 10/04/2017  . Diarrhea 08/04/2017  . Arm pain, anterior, right 06/17/2017  . OSA on CPAP 05/03/2017  . Morbid obesity due to excess calories ([place]) 05/03/2017  . Microscopic hematuria 03/27/2017  . Impingement syndrome of left shoulder 02/19/2017  . Hypersomnia 12/12/2016  . REM behavioral disorder 12/12/2016  . Asthma 12/12/2016  . Neck pain 11/17/2016  . CKD (chronic kidney disease), stage III ([place]) 11/02/2016  . CAD (coronary artery disease), native coronary artery 10/26/2016  . Poor social situation 10/13/2016  . Leg pain 08/30/2016  . Bilateral low back pain without sciatica 08/30/2016  . 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 Bilateral pulmonary embolism ([place]) 05/17/2016  . Hyperlipidemia 05/17/2016  . Depression 05/17/2016  . Diabetic peripheral neuropathy ([place]) 05/17/2016  . Neuritis of upper extremity, C7 01/15/2015  . Diabetes ([place]) 01/14/2015  . Hypertension 01/14/2015  . Numbness and  tingling of right arm 01/14/2015       Plan    Rather than performing an endometrial biopsy I recommend diagnostic and possible operative hysteroscopy and D&C to be done in main OR after medical clearance and anesthesia consult.       Emeterio Reeve 11/19/2017, 4:22 PM

## 2017-11-19 NOTE — Patient Instructions (Signed)
Hysteroscopy  Hysteroscopy is a procedure used for looking inside the womb (uterus). It may be done for various reasons, including:  · To evaluate abnormal bleeding, fibroid (benign, noncancerous) tumors, polyps, scar tissue (adhesions), and possibly cancer of the uterus.  · To look for lumps (tumors) and other uterine growths.  · To look for causes of why a woman cannot get pregnant (infertility), causes of recurrent loss of pregnancy (miscarriages), or a lost intrauterine device (IUD).  · To perform a sterilization by blocking the fallopian tubes from inside the uterus.    In this procedure, a thin, flexible tube with a tiny light and camera on the end of it (hysteroscope) is used to look inside the uterus. A hysteroscopy should be done right after a menstrual period to be sure you are not pregnant.  LET YOUR HEALTH CARE PROVIDER KNOW ABOUT:  · Any allergies you have.  · All medicines you are taking, including vitamins, herbs, eye drops, creams, and over-the-counter medicines.  · Previous problems you or members of your family have had with the use of anesthetics.  · Any blood disorders you have.  · Previous surgeries you have had.  · Medical conditions you have.  RISKS AND COMPLICATIONS  Generally, this is a safe procedure. However, as with any procedure, complications can occur. Possible complications include:  · Putting a hole in the uterus.  · Excessive bleeding.  · Infection.  · Damage to the cervix.  · Injury to other organs.  · Allergic reaction to medicines.  · Too much fluid used in the uterus for the procedure.    BEFORE THE PROCEDURE  · Ask your health care provider about changing or stopping any regular medicines.  · Do not take aspirin or blood thinners for 1 week before the procedure, or as directed by your health care provider. These can cause bleeding.  · If you smoke, do not smoke for 2 weeks before the procedure.  · In some cases, a medicine is placed in the cervix the day before the procedure.  This medicine makes the cervix have a larger opening (dilate). This makes it easier for the instrument to be inserted into the uterus during the procedure.  · Do not eat or drink anything for at least 8 hours before the surgery.  · Arrange for someone to take you home after the procedure.  PROCEDURE  · You may be given a medicine to relax you (sedative). You may also be given one of the following:  ? A medicine that numbs the area around the cervix (local anesthetic).  ? A medicine that makes you sleep through the procedure (general anesthetic).  · The hysteroscope is inserted through the vagina into the uterus. The camera on the hysteroscope sends a picture to a TV screen. This gives the surgeon a good view inside the uterus.  · During the procedure, air or a liquid is put into the uterus, which allows the surgeon to see better.  · Sometimes, tissue is gently scraped from inside the uterus. These tissue samples are sent to a lab for testing.  What to expect after the procedure  · If you had a general anesthetic, you may be groggy for a couple hours after the procedure.  · If you had a local anesthetic, you will be able to go home as soon as you are stable and feel ready.  · You may have some cramping. This normally lasts for a couple days.  · You may   have bleeding, which varies from light spotting for a few days to menstrual-like bleeding for 3-7 days. This is normal.  · If your test results are not back during the visit, make an appointment with your health care provider to find out the results.  This information is not intended to replace advice given to you by your health care provider. Make sure you discuss any questions you have with your health care provider.  Document Released: 12/04/2000 Document Revised: 02/03/2016 Document Reviewed: 03/27/2013  Elsevier Interactive Patient Education © 2017 [employer]

## 2017-11-20 ENCOUNTER — Encounter (HOSPITAL_COMMUNITY): Payer: Self-pay

## 2017-11-21 ENCOUNTER — Telehealth: Payer: Self-pay | Admitting: General Practice

## 2017-11-21 LAB — CYTOLOGY - PAP
Bacterial vaginitis: NEGATIVE
CHLAMYDIA, DNA PROBE: NEGATIVE
Candida vaginitis: NEGATIVE
DIAGNOSIS: NEGATIVE
HPV (WINDOPATH): NOT DETECTED
NEISSERIA GONORRHEA: NEGATIVE

## 2017-11-21 IMAGING — NM NM PULMONARY VENT & PERF
16 series · 16 of 16 positions shown · non-contrast
Comparison: 07/27/2016 chest radiograph. 05/17/2016 CT angiogram of
the chest.

CLINICAL DATA: 55 y/o F; chest pain and history of pulmonary
embolus.

EXAM:
NUCLEAR MEDICINE VENTILATION - PERFUSION LUNG SCAN
TECHNIQUE: Ventilation images were obtained in multiple projections using
inhaled aerosol Ac-KKm DTPA. Perfusion images were obtained in
multiple projections after intravenous injection of Ac-KKm MAA.
RADIOPHARMACEUTICALS:  31.9 mCi 4echnetium-QQm DTPA aerosol
inhalation and 4.2 mCi 4echnetium-QQm MAA IV

[Series 1: ant/post vent · 4.14mm/px · 1 of 1 slices shown (1 of 2)]
[im 1/1]
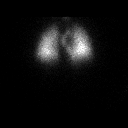

[Series 1: ant/post vent · 4.14mm/px · 1 of 1 slices shown (2 of 2)]
[im 1/1]
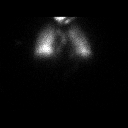

[Series 2: lao/rpo vent · 4.14mm/px · 1 of 1 slices shown (1 of 2)]
[im 1/1]
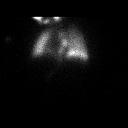

[Series 2: lao/rpo vent · 4.14mm/px · 1 of 1 slices shown (2 of 2)]
[im 1/1]
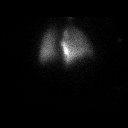

[Series 3: lpo/rao vent · 4.14mm/px · 1 of 1 slices shown (1 of 2)]
[im 1/1]
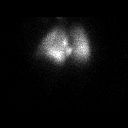

[Series 3: lpo/rao vent · 4.14mm/px · 1 of 1 slices shown (2 of 2)]
[im 1/1]
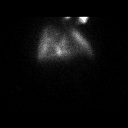

[Series 4: lt lat/rt lat vent · 4.14mm/px · 1 of 1 slices shown (1 of 2)]
[im 1/1]
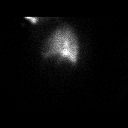

[Series 4: lt lat/rt lat vent · 4.14mm/px · 1 of 1 slices shown (2 of 2)]
[im 1/1]
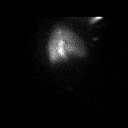

[Series 5: lt lat/rt lat perf · 4.14mm/px · 1 of 1 slices shown (1 of 2)]
[im 1/1]
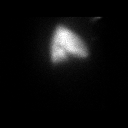

[Series 5: lt lat/rt lat perf · 4.14mm/px · 1 of 1 slices shown (2 of 2)]
[im 1/1]
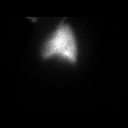

[Series 6: lpo/rao perf · 4.14mm/px · 1 of 1 slices shown (1 of 2)]
[im 1/1]
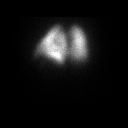

[Series 6: lpo/rao perf · 4.14mm/px · 1 of 1 slices shown (2 of 2)]
[im 1/1]
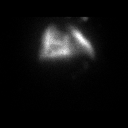

[Series 7: ant/post perf · 4.14mm/px · 1 of 1 slices shown (1 of 2)]
[im 1/1]
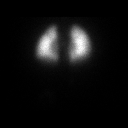

[Series 7: ant/post perf · 4.14mm/px · 1 of 1 slices shown (2 of 2)]
[im 1/1]
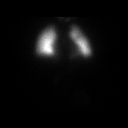

[Series 8: lao/rpo perf · 4.14mm/px · 1 of 1 slices shown (1 of 2)]
[im 1/1]
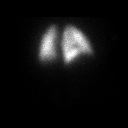

[Series 8: lao/rpo perf · 4.14mm/px · 1 of 1 slices shown (2 of 2)]
[im 1/1]
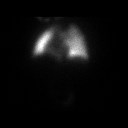

[16 of 16 positions shown; findings below may reference images not displayed]

FINDINGS: Ventilation: Right lower lobe superior segment large and left lower
lobe basilar segment small and moderate ventilation defects.

Perfusion: Perfusion defects in the lower lobes bilaterally match
ventilation defects. No mismatch defect identified.
IMPRESSION: No mismatched ventilation/perfusion defect identified to suggest
acute pulmonary embolus. Several matched ventilation/perfusion
defects in the lower lobes corresponding to emboli on the 05/17/2016
CT angiogram of chest are compatible with chronic pulmonary
embolism.

By: Dignorah Higareda M.D.

## 2017-11-21 NOTE — Telephone Encounter (Signed)
Patient called and left message on nurse line stating she was sent to her cardiologist to get surgical clearance and they are requesting a form for this to be faxed.  Called patient, no answer- left message on voicemail stating we are trying to reach you to return your phone call, please call us back

## 2017-11-23 ENCOUNTER — Other Ambulatory Visit: Payer: Self-pay | Admitting: Family Medicine

## 2017-11-23 NOTE — Patient Instructions (Addendum)
Your procedure is scheduled on: Thursday, May 23  Enter through the Main Entrance of [facility] at: 9:30 am  Pick up the phone at the desk and dial [phone].  Call this number if you have problems the morning of surgery: [phone].  Remember: Do NOT eat or Do NOT drink clear liquids (including water) after midnight Wednesday  Take these medicines the morning of surgery with a SIP OF WATER:  Amlodipine, celexa, gabapentin, metoprolol, protonix.  Zanaflex and nasonex if needed.    Half your Wednesday night dose of Lantus.  Give 25 units.  Do not take any diabetes medication on the day of surgery.  We will check your blood sugar upon arrival and treat if needed.    Bring your albuterol inhaler with you on day of surgery.  Stop herbal medications, vitaminsupplements, ibuprofen 1 week prior to surgery.  Do NOT wear jewelry (body piercing), metal hair clips/bobby pins, make-up, or nail polish. Do NOT wear lotions, powders, or perfumes.  You may wear deoderant. Do NOT shave for 48 hours prior to surgery. Do NOT bring valuables to the hospital. Dentures may not be worn into surgery.  Have a responsible adult drive you home and stay with you for 24 hours after your procedure.  Home with daughter Arley [place] via [employer] if ok by Education officer, museum.

## 2017-11-30 ENCOUNTER — Encounter (HOSPITAL_COMMUNITY)
Admission: RE | Admit: 2017-11-30 | Discharge: 2017-11-30 | Disposition: A | Discharge status: Home / Self Care | Payer: Medicare Other | Source: Ambulatory Visit | Attending: Obstetrics & Gynecology | Admitting: Obstetrics & Gynecology

## 2017-11-30 ENCOUNTER — Ambulatory Visit (INDEPENDENT_AMBULATORY_CARE_PROVIDER_SITE_OTHER): Payer: Medicare Other | Admitting: Family Medicine

## 2017-11-30 ENCOUNTER — Encounter (HOSPITAL_COMMUNITY): Payer: Self-pay | Admitting: *Deleted

## 2017-11-30 ENCOUNTER — Other Ambulatory Visit: Payer: Self-pay

## 2017-11-30 VITALS — BP 142/90 | HR 87 | Temp 98.4°F | Ht 63.5 in | Wt 233.8 lb

## 2017-11-30 DIAGNOSIS — R739 Hyperglycemia, unspecified: Secondary | ICD-10-CM | POA: Diagnosis not present

## 2017-11-30 DIAGNOSIS — Z01810 Encounter for preprocedural cardiovascular examination: Secondary | ICD-10-CM | POA: Diagnosis not present

## 2017-11-30 DIAGNOSIS — Z01812 Encounter for preprocedural laboratory examination: Secondary | ICD-10-CM | POA: Diagnosis not present

## 2017-11-30 HISTORY — DX: Myoneural disorder, unspecified: G70.9

## 2017-11-30 LAB — BASIC METABOLIC PANEL
Anion gap: 10 (ref 5–15)
BUN: 35 mg/dL — AB (ref 6–20)
CO2: 22 mmol/L (ref 22–32)
CREATININE: 1.62 mg/dL — AB (ref 0.44–1.00)
Calcium: 8.4 mg/dL — ABNORMAL LOW (ref 8.9–10.3)
Chloride: 103 mmol/L (ref 101–111)
GFR calc Af Amer: 41 mL/min — ABNORMAL LOW (ref >60)
GFR, EST NON AFRICAN AMERICAN: 35 mL/min — AB (ref >60)
Glucose, Bld: 357 mg/dL — ABNORMAL HIGH (ref 65–99)
Potassium: 4.1 mmol/L (ref 3.5–5.1)
SODIUM: 135 mmol/L (ref 135–145)

## 2017-11-30 LAB — HEPATIC FUNCTION PANEL
ALT: 19 U/L (ref 14–54)
AST: 17 U/L (ref 15–41)
Albumin: 3 g/dL — ABNORMAL LOW (ref 3.5–5.0)
Alkaline Phosphatase: 107 U/L (ref 38–126)
Bilirubin, Direct: <0.1 mg/dL — ABNORMAL LOW (ref 0.1–0.5)
TOTAL PROTEIN: 7.1 g/dL (ref 6.5–8.1)
Total Bilirubin: 0.5 mg/dL (ref 0.3–1.2)

## 2017-11-30 LAB — CBC
HCT: 40.2 % (ref 36.0–46.0)
Hemoglobin: 13.3 g/dL (ref 12.0–15.0)
MCH: 29.7 pg (ref 26.0–34.0)
MCHC: 33.1 g/dL (ref 30.0–36.0)
MCV: 89.7 fL (ref 78.0–100.0)
PLATELETS: 177 10*3/uL (ref 150–400)
RBC: 4.48 MIL/uL (ref 3.87–5.11)
RDW: 13.7 % (ref 11.5–15.5)
WBC: 8.1 10*3/uL (ref 4.0–10.5)

## 2017-11-30 NOTE — Assessment & Plan Note (Signed)
Patient advised to increase her Novolog to 5 units with meals (not just BID).  She was also advised to check her blood sugars every morning before breakfast and before every meal and to bring her meter in for Dr. Juanito Doom to see at her appointment on March 26.  She was also encouraged to avoid breads and desserts during this time.  Patient was agreeable to this plan.

## 2017-11-30 NOTE — Progress Notes (Signed)
Subjective:    Tamara Henry - 57 y.o. female MRN [id number]  Date of birth: 1961/07/26  HPI  Tamara Henry is here for hyperglycemia found during a preoperative visit at [facility] earlier today.  Serum blood glucose was found to be 357 at that time, and she was sent here for follow up of this.  She says that her blood sugars at home have been running in the 200s, although she takes her blood sugars at random times during the day.  She has been under significant stress lately, since both of her children have been in the ICU.  She reports taking her insulin regularly.  She is taking 50 units Lantus QHS and 3 units Novolog BID with meals.  Her Novolog was reduced in January from 5 units BID to 3 units BID after her A1c was found to be 6.8.  She denies polyuria, polydypsia, or polyphagia.    Health Maintenance:  Health Maintenance Due  Topic Date Due  . TETANUS/TDAP  07/07/1982  . COLONOSCOPY  07/07/2013    -  reports that she quit smoking about 12 years ago. Her smoking use included cigarettes. She has a 20.00 pack-year smoking history. She has never used smokeless tobacco. - Review of Systems: Per HPI. - Past Medical History: Patient Active Problem List   Diagnosis Date Noted  . Hyperglycemia 11/30/2017  . Vaginal bleeding 10/09/2017  . Postmenopausal vaginal bleeding 10/04/2017  . Cough 10/04/2017  . Diarrhea 08/04/2017  . Arm pain, anterior, right 06/17/2017  . OSA on CPAP 05/03/2017  . Morbid obesity due to excess calories ([place]) 05/03/2017  . Microscopic hematuria 03/27/2017  . Impingement syndrome of left shoulder 02/19/2017  . Hypersomnia 12/12/2016  . REM behavioral disorder 12/12/2016  . Asthma 12/12/2016  . Neck pain 11/17/2016  . CKD (chronic kidney disease), stage III ([place]) 11/02/2016  . CAD (coronary artery disease), native coronary artery 10/26/2016  . Poor social situation 10/13/2016  . Leg pain 08/30/2016  . Bilateral low back pain without sciatica  08/30/2016  . 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 Bilateral pulmonary embolism ([place]) 05/17/2016  . Hyperlipidemia 05/17/2016  . Depression 05/17/2016  . Diabetic peripheral neuropathy ([place]) 05/17/2016  . Neuritis of upper extremity, C7 01/15/2015  . Diabetes ([place]) 01/14/2015  . Hypertension 01/14/2015  . Numbness and tingling of right arm 01/14/2015   - Medications: reviewed and updated   Objective:   Physical Exam BP (!) 142/90 (BP Location: Right Arm, Patient Position: Sitting, Cuff Size: Large)   Pulse 87   Temp 98.4 F (36.9 C) (Oral)   Ht 5' 3.5" (1.613 m)   Wt 233 lb 12.8 oz (106.1 kg)   LMP  (LMP Unknown)   SpO2 97%   BMI 40.77 kg/m  Gen: NAD, alert, cooperative with exam, well-appearing HEENT: NCAT, clear conjunctiva, supple neck CV: RRR, good S1/S2, no murmur Resp: CTABL, no wheezes, non-labored Abd: SNTND, BS present, no guarding or organomegaly Skin: no rashes, normal turgor  Neuro: no gross deficits.  Psych: good insight, alert and oriented, somewhat tearful during exam        Assessment & Plan:   Hyperglycemia Patient advised to increase her Novolog to 5 units with meals (not just BID).  She was also advised to check her blood sugars every morning before breakfast and before every meal and to bring her meter in for Dr. Juanito Doom to see at her appointment on March 26.  She was also encouraged to avoid breads and desserts  during this time.  Patient was agreeable to this plan.    Maia Breslow, M.D. 11/30/2017, 4:54 PM PGY-1, [place]

## 2017-11-30 NOTE — Pre-Procedure Instructions (Signed)
SDS BB History Log given to lab for patient's history of a previous blood transfusion in 2017.
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
## 2017-11-30 NOTE — Patient Instructions (Signed)
It was nice meeting you today Tamara Henry!  In order to get your blood sugar under better control and get your surgery on April 9, you will need to check your blood sugars in the morning before breakfast and before each meal during the day.  Please keep a log of your sugars so that you can show them to Dr. Juanito Doom at your March 26 appointment.  We are going to increase your Novolog (the short-acting insulin) from 3 units to 5 units, which you will take with each meal.    Please focus on eating fewer breads and desserts during this time as well.    Dr. Juanito Doom will look at your sugars and make any further changes when she sees you on March 26.   If you have any questions or concerns, please feel free to call the clinic.   Be well,  Dr. Shan Levans
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
## 2017-11-30 NOTE — Telephone Encounter (Signed)
It appears patient has been schedule for surgery. She has appointment today for surgery clearance today 11/30/2017.

## 2017-12-03 ENCOUNTER — Other Ambulatory Visit: Payer: Self-pay | Admitting: Obstetrics & Gynecology

## 2017-12-04 ENCOUNTER — Ambulatory Visit (INDEPENDENT_AMBULATORY_CARE_PROVIDER_SITE_OTHER): Payer: Medicare Other | Admitting: Family Medicine

## 2017-12-04 ENCOUNTER — Other Ambulatory Visit: Payer: Self-pay

## 2017-12-04 ENCOUNTER — Encounter: Payer: Self-pay | Admitting: Family Medicine

## 2017-12-04 VITALS — BP 132/68 | HR 85 | Temp 98.3°F | Ht 64.0 in | Wt 229.4 lb

## 2017-12-04 DIAGNOSIS — Z794 Long term (current) use of insulin: Secondary | ICD-10-CM | POA: Diagnosis not present

## 2017-12-04 DIAGNOSIS — E118 Type 2 diabetes mellitus with unspecified complications: Secondary | ICD-10-CM | POA: Diagnosis not present

## 2017-12-04 LAB — GLUCOSE, POCT (MANUAL RESULT ENTRY): POC GLUCOSE: 206 mg/dL — AB (ref 70–99)

## 2017-12-04 NOTE — Patient Instructions (Signed)
Thank you for coming in today, it was so nice to see you! Today we talked about:    Diabetes: Continue taking 50 units of Lantus every night and 3 units of NovoLog before each meal.  Check your glucose before every meal.  Keep a log of your sugars.  Try to get back to how you were eating, last fried and fatty foods, less bread and sweets.    Please follow up in Follow-up in 1 week for diabetes. You can schedule this appointment at the front desk before you leave or call the clinic.  If we ordered any tests today, you will be notified via telephone of any abnormalities. If everything is normal you will get a letter in the mail.   If you have any questions or concerns, please do not hesitate to call the office at [phone]. You can also message me directly via MyChart.   Sincerely,  Smitty Cords, MD

## 2017-12-04 NOTE — Progress Notes (Signed)
s  Subjective:    Patient ID: Tamara Henry , female   DOB: 31-Jan-1961 , 57 y.o..   MRN: [id number]  HPI  Tamara Henry is a F with PMH ofCAD s/p CABG, PE, HTN,T2DM, CKDIII, OSA on CPAP, Asthma here for   1. Chronic Diabetes  Disease Monitoring  Blood Sugar Ranges: average is 220 fasting. The one that they checked at the office before her surgery 350. She notes that the day before she was in a funk. Her glucose was 121 before bed last night, she didn't eat dinner.   Polyuria: no   Visual problems: yes, she was following with the eye doctor. She is due for her appointment now   Last hemoglobin A1C:  Lab Results  Component Value Date   HGBA1C 6.8 10/02/2017   Medication Compliance: yes. She take Novolog 3 units before meals. Takes 50 units of Lantus at night  Medication Side Effects  Hypoglycemia: no   Preventitive Health Care  Diet pattern: She notes for the last couple weeks her glucoses have been high because her kids have been in the hospital; her son had a MI and daughter had a brain mass. She has not been eating as well as she was in the past.  Has been eating more fast foods.  Ex[facility]: [facility]  2. Uterine bleeding: She states she was placed on Megace and she stopped bleeding. She also stopped Xarelto  Review of Systems: Per HPI.   Past Medical History: Patient Active Problem List   Diagnosis Date Noted  . Hyperglycemia 11/30/2017  . Vaginal bleeding 10/09/2017  . Postmenopausal vaginal bleeding 10/04/2017  . Cough 10/04/2017  . Diarrhea 08/04/2017  . Arm pain, anterior, right 06/17/2017  . OSA on CPAP 05/03/2017  . Morbid obesity due to excess calories ([place]) 05/03/2017  . Microscopic hematuria 03/27/2017  . Impingement syndrome of left shoulder 02/19/2017  . Hypersomnia 12/12/2016  . REM behavioral disorder 12/12/2016  . Asthma 12/12/2016  . Neck pain 11/17/2016  . CKD (chronic kidney disease), stage III ([place]) 11/02/2016  . CAD  (coronary artery disease), native coronary artery 10/26/2016  . Poor social situation 10/13/2016  . Leg pain 08/30/2016  . Bilateral low back pain without sciatica 08/30/2016  . 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 Bilateral pulmonary embolism ([place]) 05/17/2016  . Hyperlipidemia 05/17/2016  . Depression 05/17/2016  . Diabetic peripheral neuropathy ([place]) 05/17/2016  . Neuritis of upper extremity, C7 01/15/2015  . Diabetes ([place]) 01/14/2015  . Hypertension 01/14/2015  . Numbness and tingling of right arm 01/14/2015    Medications: reviewed  Social Hx:  reports that she quit smoking about 12 years ago. Her smoking use included cigarettes. She has a 20.00 pack-year smoking history. She has never used smokeless tobacco.   Objective:   BP 132/68   Pulse 85   Temp 98.3 F (36.8 C) (Oral)   Ht 5\' 4"  (1.626 m)   Wt 229 lb 6.4 oz (104.1 kg)   LMP  (LMP Unknown)   SpO2 97%   BMI 39.38 kg/m  Physical Exam  Gen: NAD, alert, cooperative with exam, well-appearing Cardiac: Regular rate and rhythm Respiratory:  non-labored breathing Psych: good insight, normal mood and affect  Results for orders placed or performed in visit on 12/04/17  Glucose (CBG)  Result Value Ref Range   POC Glucose 206 (A) 70 - 99 mg/dl    Assessment & Plan:  Diabetes ([place]) Controlled per last hemoglobin  A1c of 6.8 however she has had some hyperglycemia.  Both of patient's children were recently in the ICU so she was understandably unable to be completely compliant with her diabetic regimen and diabetic diet.  Apparently she was supposed to have her hysterectomy but her glucose was too high.  Point-of-care glucose today 206. -We will continue current insulin regimen (Lantus 50 units at night and NovoLog 3 units before meals) for now as patient's children are now out of the hospital and she can get back to her normal routine - Patient aware of which foods to decrease and stay away from including carbohydrate rich foods and  sugary beverages -Return in 1 week to review blood sugar results and ensure she is not having persistent hyperglycemia  Orders Placed This Encounter  Procedures  . Glucose (CBG)    Smitty Cords, MD [place], PGY-3

## 2017-12-05 NOTE — Assessment & Plan Note (Signed)
Controlled per last hemoglobin A1c of 6.8 however she has had some hyperglycemia.  Both of patient's children were recently in the ICU so she was understandably unable to be completely compliant with her diabetic regimen and diabetic diet.  Apparently she was supposed to have her hysterectomy but her glucose was too high.  Point-of-care glucose today 206. -We will continue current insulin regimen (Lantus 50 units at night and NovoLog 3 units before meals) for now as patient's children are now out of the hospital and she can get back to her normal routine - Patient aware of which foods to decrease and stay away from including carbohydrate rich foods and sugary beverages -Return in 1 week to review blood sugar results and ensure she is not having persistent hyperglycemia

## 2017-12-06 ENCOUNTER — Ambulatory Visit (INDEPENDENT_AMBULATORY_CARE_PROVIDER_SITE_OTHER): Payer: Medicare Other | Admitting: Cardiology

## 2017-12-06 ENCOUNTER — Encounter: Payer: Self-pay | Admitting: Cardiology

## 2017-12-06 VITALS — BP 148/80 | HR 81 | Ht 64.0 in | Wt 228.6 lb

## 2017-12-06 DIAGNOSIS — I2510 Atherosclerotic heart disease of native coronary artery without angina pectoris: Secondary | ICD-10-CM

## 2017-12-06 LAB — HEPATIC FUNCTION PANEL
ALT: 17 IU/L (ref 0–32)
AST: 13 IU/L (ref 0–40)
Albumin: 3.5 g/dL (ref 3.5–5.5)
Alkaline Phosphatase: 121 IU/L — ABNORMAL HIGH (ref 39–117)
BILIRUBIN TOTAL: 0.4 mg/dL (ref 0.0–1.2)
BILIRUBIN, DIRECT: 0.11 mg/dL (ref 0.00–0.40)
TOTAL PROTEIN: 6.6 g/dL (ref 6.0–8.5)

## 2017-12-06 LAB — LIPID PANEL
Chol/HDL Ratio: 6.6 ratio — ABNORMAL HIGH (ref 0.0–4.4)
Cholesterol, Total: 232 mg/dL — ABNORMAL HIGH (ref 100–199)
HDL: 35 mg/dL — ABNORMAL LOW (ref >39)
LDL CALC: 162 mg/dL — AB (ref 0–99)
Triglycerides: 176 mg/dL — ABNORMAL HIGH (ref 0–149)
VLDL CHOLESTEROL CAL: 35 mg/dL (ref 5–40)

## 2017-12-06 MED ORDER — ATORVASTATIN CALCIUM 80 MG PO TABS: 80.0000 mg | ORAL_TABLET | Freq: Every evening | ORAL | 3 refills | Status: DC

## 2017-12-06 NOTE — Patient Instructions (Addendum)
Medication Instructions:  Your physician recommends that you continue on your current medications as directed. Please refer to the Current Medication list given to you today.  Labwork: NONE  Testing/Procedures: Your physician recommends that you have lab work today- fasting lipid and liver panel  Follow-Up: Your physician wants you to follow-up in: 12 months with Dr. Radford Pax. You will receive a reminder letter in the mail two months in advance. If you don't receive a letter, please call our office to schedule the follow-up appointment.   If you need a refill on your cardiac medications before your next appointment, please call your pharmacy.

## 2017-12-06 NOTE — Progress Notes (Signed)
12/06/2017 Tamara Henry   Dec 21, 1960  [id number]  Primary Physician Tamara Dolly, MD Primary Cardiologist: Dr. Radford Henry   Reason for Visit/CC: Preoperative cardiac evaluation  HPI:  Tamara Henry is a 57 y.o. female who is being seen today for operative cardiac evaluation.  She has been followed by Dr. Radford Henry, but has not been seen since early 2018.  She has a history of insulin-dependent diabetes, hyperlipidemia, CAD, chronic kidney disease and history of bilateral PE. In 2017, she presented to the hospital with chest pain and ruled in for NSTEMI and underwent cath showing severe 3 vessel CAD and underwent CABG. She had postoperative atrial fibrillation which was managed with amiodarone (discontinued after 3 months of therapy without recurrence of A. Fib).  She also has a history of bilateral pulmonary emboli that was treated w/ Xarelto.    She is here today for evaluation because she is needing to undergo a gynecologic procedure, D&C/hysteroscopy, given abnormal uterine bleeding.  From a cardiac standpoint, she reports that she has done well since her last office visit.  She denies any anginal symptomatology.  No exertional chest pain or dyspnea.  She is able to ambulate a flight of stairs without symptoms.  She denies lower extremity edema, orthopnea, PND, palpitations, syncope/near syncope.  She reports full medication compliance.  She is no longer on Xarelto.  She reports that her PCP discontinued this. She has done ok off of medication.     Current Meds  Medication Sig  . albuterol (PROVENTIL HFA;VENTOLIN HFA) 108 (90 Base) MCG/ACT inhaler Inhale 1-2 puffs into the lungs every 6 (six) hours as needed for wheezing or shortness of breath.  Marland Kitchen amLODipine (NORVASC) 10 MG tablet Take 1 TABLET BY MOUTH daily  . aspirin 81 MG EC tablet Take 1 tablet (81 mg total) by mouth daily.  Marland Kitchen b complex vitamins tablet Take 1 tablet by mouth daily.  . calcipotriene (DOVONOX) 0.005 % cream Apply 1  application topically as needed (itching).   . citalopram (CELEXA) 20 MG tablet Take 1 tablet (20 mg total) by mouth daily *Morning* **emergency fill 08/03/2017**  . diphenoxylate-atropine (LOMOTIL) 2.5-0.025 MG tablet Take 1 tablet by mouth 4 (four) times daily as needed for diarrhea or loose stools. Take 1-2 tablets up to 4 times daily.  . ferrous sulfate 325 (65 FE) MG tablet Take 1 tablet (325 mg total) by mouth daily with breakfast.  . Flaxseed, Linseed, (FLAX SEED OIL) 1000 MG CAPS Take 1,000 mg by mouth daily.  . folic acid (FOLVITE) 017 MCG tablet Take 400 mcg by mouth daily.  . furosemide (LASIX) 40 MG tablet Take 1 TABLET BY MOUTH daily  . gabapentin (NEURONTIN) 300 MG capsule Take 3 capsules BY MOUTH EVERY MORNING, Take 2 capsules BY MOUTH at lunch, and take 3 capsules BY MOUTH at bedtime  . glucose blood (ONETOUCH VERIO) test strip Check blood sugar 6 x daily  . guaiFENesin (MUCINEX) 600 MG 12 hr tablet Take 2 tablets (1,200 mg total) 2 (two) times daily by mouth.  . insulin aspart (NOVOLOG) 100 UNIT/ML injection Inject 5 Units into the skin 2 (two) times daily before a meal. (Patient taking differently: Inject 3 Units into the skin 2 (two) times daily before a meal. )  . Insulin Glargine (LANTUS SOLOSTAR) 100 UNIT/ML Solostar Pen Inject 50 Units daily at 10 pm into the skin.  Marland Kitchen loperamide (IMODIUM A-D) 2 MG tablet Take 4 mg by mouth as needed for diarrhea or loose stools.  Marland Kitchen  megestrol (MEGACE) 40 MG tablet Take 1 tablet (40 mg total) by mouth 2 (two) times daily.  . Melatonin 5 MG CAPS Take 5 mg by mouth at bedtime.  . metoprolol tartrate (LOPRESSOR) 25 MG tablet Take 1 TABLET (25mg ) BY MOUTH TWICE DAILY *Morning&Evening*  . mometasone (NASONEX) 50 MCG/ACT nasal spray Place 2 sprays into the nose daily as needed for congestion.  . Multiple Vitamins-Minerals (MULTIVITAMIN) tablet Take 1 tablet by mouth daily.  Marland Kitchen omeprazole (PRILOSEC) 40 MG capsule Take 1 capsule BY MOUTH daily  .  pantoprazole (PROTONIX) 40 MG tablet Take 1 tablet (40 mg total) by mouth 2 (two) times daily before a meal.  . Polyvinyl Alcohol (LUBRICANT DROPS OP) Place 1 drop into both eyes daily as needed (dry eyes).  . potassium chloride SA (K-DUR,KLOR-CON) 20 MEQ tablet take half a TABLET BY MOUTH EVERY MORNING  . Probiotic Product (ALIGN) 4 MG CAPS Take 1 capsule (4 mg total) by mouth daily.  . promethazine-codeine (PHENERGAN WITH CODEINE) 6.25-10 MG/5ML syrup Take 5 mLs by mouth every 6 (six) hours as needed for cough.  Marland Kitchen tiZANidine (ZANAFLEX) 4 MG tablet Take 1 tablet (4 mg total) by mouth every 6 (six) hours as needed for muscle spasms.  . vitamin C (ASCORBIC ACID) 500 MG tablet Take 500 mg by mouth daily.  Tamara Henry (PREPARATION H) 50 % PADS Apply 1 application 2 (two) times daily topically. (Patient taking differently: Apply 1 application topically 2 (two) times daily as needed (itching). )   Current Facility-Administered Medications for the 12/06/17 encounter (Office Visit) with Tamara Pandy, PA-C  Medication  . 0.9 %  sodium chloride infusion   Allergies  Allergen Reactions  . Lactose Intolerance (Gi) Diarrhea  . Lisinopril Other (See Comments) and Cough    Inflammation, coughing  . Reglan [Metoclopramide] Other (See Comments)    REACTION IS SIDE EFFECT Pt stated having lock jaw as a side effect  . Wellbutrin [Bupropion] Nausea And Vomiting and Cough  . Latex Rash  . Metformin And Related Diarrhea  . Penicillins Rash    [FROM PREVIOUS ENTRY-BEFORE 07/27/16] >>"ALL CILLINS"  PATIENT HAD A PCN REACTION WITH IMMEDIATE RASH, FACIAL/TONGUE/THROAT SWELLING, SOB, OR LIGHTHEADEDNESS WITH HYPOTENSION:  #  #  #  YES  #  #  #   Has patient had a PCN reaction causing severe rash involving mucus membranes or skin necrosis: No Has patient had a PCN reaction that required hospitalization No Has patient had a PCN reaction occurring within the last 10 years:   #  #  #  YES  #  #  #   . Sulfa  Antibiotics Itching  . Tape Rash    Adhesive tape   Past Medical History:  Diagnosis Date  . Acid reflux   . Allergy    seasonal  . Anxiety   . Arthritis    lower back, feet  . Asthma    albuterol inhaler - rarely uses - only needs when she gets sick  . Back pain   . Bilateral pulmonary embolism ([place]) 05/2016  . Bipolar disorder ([place])   . CAD in native artery    S/P NSTEMI with cath showing severe 3 vessel ASCAD s/p CABGx4 07/28/16.  . Cataracts, bilateral   . CKD (chronic kidney disease), stage III (HCC)    borderline CKD II-III  . Cocaine abuse ([place])    In remission. Stopped using in early 2000's  . Depression   . Diabetes mellitus ([place])  Type II  . GERD (gastroesophageal reflux disease)   . Glaucoma   . High cholesterol   . History of blood transfusion 2017  . History of pulmonary embolus (PE)   . 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 Hx of chest tube placement right   . Hypertension   . Morbid obesity ([place])   . Myocardial infarction ([place]) 2017  . Neuromuscular disorder (HCC)    neuropathy in hands and feet  . Neuropathy    hands and feet  . Pneumonia   . Postoperative anemia 07/2016  . PTSD (post-traumatic stress disorder)   . Sleep apnea    Does not use cpap  . Stroke [facility]) 2005   no residual  . SVD (spontaneous vaginal delivery)    x 4   Family History  Problem Relation Age of Onset  . Pancreatitis Mother   . Diabetes type II Mother   . Diabetes Mother   . CAD Father   . Heart attack Father   . Diabetes type II Sister   . Diabetes Sister   . Diabetes type II Brother   . Diabetes Brother   . Diabetes Brother   . Diabetes Brother   . Clotting disorder Son        heart attack x 3   . Breast cancer Maternal Aunt   . Colon cancer Neg Hx   . Stomach cancer Neg Hx    Past Surgical History:  Procedure Laterality Date  . CARDIAC CATHETERIZATION N/A 07/25/2016   Procedure: Left Heart Cath and Coronary Angiography;  Surgeon: Peter M Martinique, MD;  Location: [place] [facility];  Service: Cardiovascular;  Laterality: N/A;  . COLONOSCOPY    . COLONOSCOPY W/ POLYPECTOMY    . CORONARY ARTERY BYPASS GRAFT N/A 07/28/2016   Procedure: CORONARY ARTERY BYPASS GRAFTING (CABG)x4 using left internal mammary and endoscopic harvest of right greater saphenous vein;  Surgeon: Ivin Poot, MD;  Location: [facility];  Service: Open Heart Surgery;  Laterality: N/A;  . DENTAL SURGERY     to removed broken teeth  . POLYPECTOMY    . PULMONARY EMBOLISM SURGERY  2017   [facility]  . SHOULDER ARTHROSCOPY WITH ROTATOR CUFF REPAIR Left 02/19/2017   Procedure: LEFT SHOULDER ARTHROSCOPY with debridement andROTATOR CUFF REPAIR;  Surgeon: [name], MD;  Location: [place];  Service: Orthopedics;  Laterality: Left;  . TEE WITHOUT CARDIOVERSION N/A 07/28/2016   Procedure: TRANSESOPHAGEAL ECHOCARDIOGRAM (TEE);  Surgeon: Ivin Poot, MD;  Location: [place] [facility];  Service: Open Heart Surgery;  Laterality: N/A;  . TONSILLECTOMY    . TUBAL LIGATION     Social History   Socioeconomic History  . Marital status: Widowed    Spouse name: Not on file  . Number of children: 4  . Years of education: 14  . Highest education level: Not on file  Occupational History  . Occupation: disabled  Social Needs  . Financial resource strain: Not on file  . Food insecurity:    Worry: Not on file    Inability: Not on file  . Transportation needs:    Medical: Not on file    Non-medical: Not on file  Tobacco Use  . Smoking status: Former Smoker    Packs/day: 2.00    Years: 10.00    Pack years: 20.00    Types: Cigarettes    Last attempt to quit: 09/11/2005    Years since quitting: 12.2  . Smokeless tobacco: Never Used  Substance and Sexual  Activity  . Alcohol use: No  . Drug use: Not Currently    Types: Cocaine    Comment: History Cocaine - last use in 2000's  . Sexual activity: Not Currently    Birth control/protection: Surgical  Lifestyle  . Physical activity:    Days per week: Not on file      Minutes per session: Not on file  . Stress: Not on file  Relationships  . Social connections:    Talks on phone: Not on file    Gets together: Not on file    Attends religious service: Not on file    Active member of club or organization: Not on file    Attends meetings of clubs or organizations: Not on file    Relationship status: Not on file  . Intimate partner violence:    Fear of current or ex partner: Not on file    Emotionally abused: Not on file    Physically abused: Not on file    Forced sexual activity: Not on file  Other Topics Concern  . Not on file  Social History Narrative      Drinks no caffeine       Current Social History 06/26/2017           Patient lives with daughter Arley Phenix and 4 grandchildren in one level home 06/26/2017   Transportation: Patient currently relies on medicaid transportation 06/26/2017   Important Relationships Pastor in [place] and [place] here 06/26/2017    Pets: None 06/26/2017   Education / Work:  12 th grade/ Disabled 06/26/2017   Interests / Fun: Go to [employer] and church 06/26/2017   Current Stressors: Health and two sons and godson incarcerated 06/26/2017   Religious / Personal Beliefs: Pentacostal 06/26/2017   Other: Need help with mental health 06/26/2017   L. Ducatte, RN, BSN                                                                                                   Review of Systems: General: negative for chills, fever, night sweats or weight changes.  Cardiovascular: negative for chest pain, dyspnea on exertion, edema, orthopnea, palpitations, paroxysmal nocturnal dyspnea or shortness of breath Dermatological: negative for rash Respiratory: negative for cough or wheezing Urologic: negative for hematuria Abdominal: negative for nausea, vomiting, diarrhea, bright red blood per rectum, melena, or hematemesis Neurologic: negative for visual changes, syncope, or dizziness All other systems reviewed and are otherwise negative  except as noted above.   Physical Exam:  Blood pressure (!) 148/80, pulse 81, height 5\' 4"  (1.626 m), weight 228 lb 9.6 oz (103.7 kg).  General appearance: alert, cooperative and moderately obese Neck: no carotid bruit and no JVD Lungs: clear to auscultation bilaterally Heart: regular rate and rhythm, S1, S2 normal, no murmur, click, rub or gallop Extremities: extremities normal, atraumatic, no cyanosis or edema Pulses: 2+ and symmetric Skin: Skin color, texture, turgor normal. No rashes or lesions Neurologic: Grossly normal  EKG NSR w/ PVCs -- personally reviewed   ASSESSMENT AND PLAN:   1.  CAD: H/o NSTEMI November 2017, treated  with coronary artery bypass grafting.  Stable without chest pain.  She denies exertional symptoms.  Continue medical therapy with aspirin, statin and beta-blocker   2.  Hypertension: Mildly elevated this morning at 148/80 however patient reports that she has not yet taken her morning medications.  She usually takes her medicines around 9:30.  PCP recently checked basic metabolic panel which showed stable renal function and electrolytes.  3.  Hyperlipidemia: Last lipid panel on file from 2018 showed poorly controlled LDL at 114 mg/dL.  We will need to repeat fasting lipid panel and adjust regimen if needed.  She may need addition of Zetia to statin or consideration of PCsK 9 inhibitors if still not under good control.  4.  Insulin-dependent diabetes: Managed by primary care.  Hgb A1c January 2019 was 6.8.  5.  Chronic kidney disease: Recent basic metabolic panel showed GFR 35 mL/min.  Creatinine was 1.62.  6.  History of bilateral PEs: Pt reports that she is no longer on Xarelto.  This was discontinued by her primary care provider.  She reports that she has done well without any chest pain or dyspnea  7. Preoperative cardiac evaluation: She is doing well from a cardiac standpoint.  She denies any anginal symptomatology.  She is able to complete at least 4 METs  of physical activity without exertional chest pain or dyspnea. She is able to ambulate a flight of stairs without symptoms.  Her physical exam is benign.  No murmurs or carotid bruits.  EKG shows normal sinus rhythm with occasional PVCs however she is asymptomatic.  Vital signs are stable.  Based on assessment, patient can undergo her procedure without need for additional cardiac testing.  She is of acceptable risk to proceed.  Recommend continuation of beta-blocker and statin during the perioperative period.  Follow-Up w/ Dr. Radford Henry in 1 year or sooner if needed.   Chanah Tidmore Ladoris Gene, MHS [facility] 12/06/2017 8:34 AM

## 2017-12-07 ENCOUNTER — Telehealth: Payer: Self-pay | Admitting: *Deleted

## 2017-12-07 DIAGNOSIS — Z79899 Other long term (current) drug therapy: Secondary | ICD-10-CM

## 2017-12-07 MED ORDER — EZETIMIBE 10 MG PO TABS: 10.0000 mg | ORAL_TABLET | Freq: Every day | ORAL | 3 refills | Status: DC

## 2017-12-07 NOTE — Telephone Encounter (Signed)
-----   Message from Consuelo Pandy, [place] sent at 12/07/2017  4:12 PM EDT ----- LDL (bad cholesterol) is really high at 162. We recommend keeping level <70 to reduce risk for blockages in heart/ recurrent heart attack. Call pt to make sure she is taking her statin medication every night. If not, improve compliance. Notify us if any side effects, affecting compliance. If she has been taking Lipitor every night, then we should add Zetia 10 mg and recheck FLP and HFTs again in 6-8 weeks.

## 2017-12-11 ENCOUNTER — Other Ambulatory Visit: Payer: Self-pay

## 2017-12-11 ENCOUNTER — Ambulatory Visit (INDEPENDENT_AMBULATORY_CARE_PROVIDER_SITE_OTHER): Payer: Medicare Other | Admitting: Family Medicine

## 2017-12-11 ENCOUNTER — Encounter (HOSPITAL_COMMUNITY): Payer: Self-pay

## 2017-12-11 ENCOUNTER — Encounter: Payer: Self-pay | Admitting: Family Medicine

## 2017-12-11 VITALS — BP 124/78 | HR 84 | Temp 98.0°F | Ht 64.0 in | Wt 229.2 lb

## 2017-12-11 DIAGNOSIS — Z01818 Encounter for other preprocedural examination: Secondary | ICD-10-CM | POA: Diagnosis not present

## 2017-12-11 DIAGNOSIS — Z79899 Other long term (current) drug therapy: Secondary | ICD-10-CM | POA: Diagnosis not present

## 2017-12-11 NOTE — Progress Notes (Signed)
    Subjective:  Tamara Henry is a 57 y.o. female who presents to the [facility] today with a chief complaint of surgery clearance and med question.   Patient here for surgery clearance for sonohysterography [facility] w/ Dr. Roselie Awkward.  She has no new complaints to address today beyond this paperwork and a med question.  She is being stable with her meds and has no new chest pain/SOB/LOC/falls  Patient said zetia was cancelled by someone and cardiology wanted her back on it.  They told her to ask her pcp why and to ask to be back on it.  (IT is on in the medlist now)  Objective:  Physical Exam: BP 124/78   Pulse 84   Temp 98 F (36.7 C) (Oral)   Ht 5\' 4"  (1.626 m)   Wt 229 lb 3.2 oz (104 kg)   LMP  (LMP Unknown)   SpO2 98%   BMI 39.34 kg/m   Gen: NAD, sitting comfortably CV: RRR with no murmurs appreciated (no afib/PVCs  Heard) Pulm: NWOB, CTAB with no crackles, wheezes, or rhonchi GI: Normal bowel sounds present. Soft, Nontender, Nondistended. MSK: no edema, cyanosis, or clubbing noted Skin: warm, dry Neuro: grossly normal, moves all extremities.  Chronic droop to right lip. Psych: Normal affect and thought content  No results found for this or any previous visit (from the past 72 hour(s)).   Assessment/Plan:  Encounter for medication review Question from patient.  Says cardiology wanted her on zetia but it had been stopped lately.  I found a log about it being cancelled from pharmacy but no record as to from who and no record about why.  With cardiology being very specific about restarting it and without verifiable reason to disagree, she is being instructed to continue zetia unless told otherwise  Pre-operative clearance Sonohysterography later this week.  Cardiology recently cleared.  She is ASA class 2/3 from my review of the records and the procedure is medically necessary.  Wrote her a letter saying we agree with cardiology that she is ok for procedure.  It was faxed to surgeon  and hard copy given to patient with med/problem list.   Sherene Sires, [place] - PGY1 12/11/2017 9:47 AM

## 2017-12-11 NOTE — Assessment & Plan Note (Signed)
Question from patient.  Says cardiology wanted her on zetia but it had been stopped lately.  I found a log about it being cancelled from pharmacy but no record as to from who and no record about why.  With cardiology being very specific about restarting it and without verifiable reason to disagree, she is being instructed to continue zetia unless told otherwise

## 2017-12-11 NOTE — Assessment & Plan Note (Signed)
Sonohysterography later this week.  Cardiology recently cleared.  She is ASA class 2/3 from my review of the records and the procedure is medically necessary.  Wrote her a letter saying we agree with cardiology that she is ok for procedure.  It was faxed to surgeon and hard copy given to patient with med/problem list.

## 2017-12-11 NOTE — Patient Instructions (Signed)
It was a pleasure to see you today! Thank you for choosing [facility] for your primary care. Tamara Henry was seen for surgery clearance. Come back to the clinic if you have any new concerns, and go to the emergency room if you have life threatening symptoms.  Today we wrote you a letter agreeing with your cardiologist that you should be ok for your procedure.  We also agreed with your cardiologist that we can't find a reason to not take the zetia in your chart.    If we did any lab work today, and the results require attention, either me or my nurse will get in touch with you. If everything is normal, you will get a letter in mail and a message via . If you don't hear from [place] in two weeks, please give [place] a call. Otherwise, we look forward to seeing you again at your next visit. If you have any questions or concerns before then, please call the clinic at [phone].  Please bring all your medications to every doctors visit  Sign up for My Chart to have easy access to your labs results, and communication with your Primary care physician.    Please check-out at the front desk before leaving the clinic.    Best,  Dr. Sherene Sires FAMILY MEDICINE RESIDENT - PGY1 12/11/2017 9:26 AM

## 2017-12-13 DIAGNOSIS — G4733 Obstructive sleep apnea (adult) (pediatric): Secondary | ICD-10-CM | POA: Diagnosis not present

## 2017-12-17 ENCOUNTER — Encounter (HOSPITAL_COMMUNITY): Payer: Self-pay | Admitting: Anesthesiology

## 2017-12-17 NOTE — Anesthesia Preprocedure Evaluation (Deleted)
Anesthesia Evaluation  Patient identified by MRN, date of birth, ID band Patient awake    Reviewed: Allergy & Precautions, H&P , NPO status , Patient's Chart, lab work & pertinent test results  Airway Mallampati: II  TM Distance: >3 FB Neck ROM: Full    Dental no notable dental hx. (+) Poor Dentition, Dental Advisory Given   Pulmonary asthma , sleep apnea , former smoker,    Pulmonary exam normal breath sounds clear to auscultation       Cardiovascular hypertension, Pt. on medications and Pt. on home beta blockers + CAD   Rhythm:Regular Rate:Normal     Neuro/Psych Anxiety Depression Bipolar Disorder CVA    GI/Hepatic Neg liver ROS, GERD  Medicated and Controlled,  Endo/Other  diabetes, Insulin DependentMorbid obesity  Renal/GU Renal disease  negative genitourinary   Musculoskeletal   Abdominal   Peds  Hematology negative hematology ROS (+)   Anesthesia Other Findings   Reproductive/Obstetrics negative OB ROS                             Anesthesia Physical  Anesthesia Plan  ASA: III  Anesthesia Plan: General   Post-op Pain Management:  Regional for Post-op pain   Induction: Intravenous  PONV Risk Score and Plan: 3 and Ondansetron, Dexamethasone, Propofol, Midazolam and Treatment may vary due to age or medical condition  Airway Management Planned: LMA  Additional Equipment:   Intra-op Plan:   Post-operative Plan: Extubation in OR  Informed Consent: I have reviewed the patients History and Physical, chart, labs and discussed the procedure including the risks, benefits and alternatives for the proposed anesthesia with the patient or authorized representative who has indicated his/her understanding and acceptance.   Dental advisory given  Plan Discussed with: CRNA  Anesthesia Plan Comments:         Anesthesia Quick Evaluation

## 2017-12-18 ENCOUNTER — Ambulatory Visit (HOSPITAL_COMMUNITY)
Admission: AD | Admit: 2017-12-18 | Payer: Medicare Other | Source: Ambulatory Visit | Admitting: Obstetrics & Gynecology

## 2017-12-18 ENCOUNTER — Encounter (HOSPITAL_COMMUNITY): Payer: Self-pay

## 2017-12-18 ENCOUNTER — Encounter (HOSPITAL_COMMUNITY): Admission: AD | Payer: Self-pay | Source: Ambulatory Visit

## 2017-12-18 SURGERY — DILATATION AND CURETTAGE /HYSTEROSCOPY
Anesthesia: Choice

## 2017-12-18 MED ORDER — KETOROLAC TROMETHAMINE 30 MG/ML IJ SOLN
INTRAMUSCULAR | Status: AC
  Filled 2017-12-18: qty 1

## 2017-12-18 MED ORDER — FENTANYL CITRATE (PF) 100 MCG/2ML IJ SOLN
INTRAMUSCULAR | Status: AC
  Filled 2017-12-18: qty 2

## 2017-12-18 MED ORDER — DEXAMETHASONE SODIUM PHOSPHATE 4 MG/ML IJ SOLN
INTRAMUSCULAR | Status: AC
  Filled 2017-12-18: qty 1

## 2017-12-18 MED ORDER — MIDAZOLAM HCL 2 MG/2ML IJ SOLN
INTRAMUSCULAR | Status: AC
  Filled 2017-12-18: qty 2

## 2017-12-18 MED ORDER — ONDANSETRON HCL 4 MG/2ML IJ SOLN
INTRAMUSCULAR | Status: AC
  Filled 2017-12-18: qty 2

## 2017-12-18 MED ORDER — PROPOFOL 10 MG/ML IV BOLUS
INTRAVENOUS | Status: AC
  Filled 2017-12-18: qty 20

## 2017-12-18 NOTE — Addendum Note (Signed)
Encounter addended by: Berneta Sages, RN on: 12/18/2017 9:19 AM  Actions taken: Sign clinical note

## 2017-12-18 NOTE — Addendum Note (Signed)
Encounter addended by: Berneta Sages, RN on: 12/18/2017 9:23 AM  Actions taken: Sign clinical note

## 2017-12-24 DIAGNOSIS — E113513 Type 2 diabetes mellitus with proliferative diabetic retinopathy with macular edema, bilateral: Secondary | ICD-10-CM | POA: Diagnosis not present

## 2017-12-25 ENCOUNTER — Other Ambulatory Visit: Payer: Self-pay

## 2017-12-25 NOTE — Telephone Encounter (Signed)
[facility] is closing down. They were supposed to transfer [facility]'s prescriptions to [facility] [address]. I called [facility] and they stated they have been transferred but [facility] had [facility].   Per pharmacist the simplest thing to do at this point is send new prescriptions which are attached. Danley Danker, RN [facility] RN)

## 2017-12-26 ENCOUNTER — Ambulatory Visit (INDEPENDENT_AMBULATORY_CARE_PROVIDER_SITE_OTHER): Payer: Medicare Other | Admitting: Orthopaedic Surgery

## 2017-12-26 ENCOUNTER — Telehealth: Payer: Self-pay

## 2017-12-26 ENCOUNTER — Other Ambulatory Visit: Payer: Self-pay | Admitting: Cardiology

## 2017-12-26 MED ORDER — METOPROLOL TARTRATE 25 MG PO TABS: ORAL_TABLET | ORAL | 2 refills | Status: DC

## 2017-12-26 MED ORDER — INSULIN ASPART 100 UNIT/ML ~~LOC~~ SOLN: 5.0000 [IU] | Freq: Two times a day (BID) | SUBCUTANEOUS | 3 refills | Status: DC

## 2017-12-26 MED ORDER — INSULIN GLARGINE 100 UNIT/ML SOLOSTAR PEN: 50.0000 [IU] | PEN_INJECTOR | Freq: Every day | SUBCUTANEOUS | 99 refills | Status: DC

## 2017-12-26 MED ORDER — FUROSEMIDE 40 MG PO TABS: 40.0000 mg | ORAL_TABLET | Freq: Every day | ORAL | 3 refills | Status: DC

## 2017-12-26 MED ORDER — EZETIMIBE 10 MG PO TABS: 10.0000 mg | ORAL_TABLET | Freq: Every day | ORAL | 3 refills | Status: DC

## 2017-12-26 MED ORDER — GABAPENTIN 300 MG PO CAPS: ORAL_CAPSULE | ORAL | 2 refills | Status: DC

## 2017-12-26 MED ORDER — AMLODIPINE BESYLATE 10 MG PO TABS: 10.0000 mg | ORAL_TABLET | Freq: Every day | ORAL | 3 refills | Status: DC

## 2017-12-26 MED ORDER — TIZANIDINE HCL 4 MG PO TABS: 4.0000 mg | ORAL_TABLET | Freq: Four times a day (QID) | ORAL | 0 refills | Status: DC | PRN

## 2017-12-26 MED ORDER — POTASSIUM CHLORIDE CRYS ER 20 MEQ PO TBCR: EXTENDED_RELEASE_TABLET | ORAL | 2 refills | Status: DC

## 2017-12-26 MED ORDER — OMEPRAZOLE 40 MG PO CPDR: 40.0000 mg | DELAYED_RELEASE_CAPSULE | Freq: Every day | ORAL | 2 refills | Status: DC

## 2017-12-26 MED ORDER — GLUCOSE BLOOD VI STRP: ORAL_STRIP | 3 refills | Status: DC

## 2017-12-26 NOTE — Telephone Encounter (Signed)
Pt's medication was sent to pt's pharmacy as requested. Confirmation received.  °

## 2017-12-26 NOTE — Telephone Encounter (Signed)
Pt left message on nurse line stating she was wanting to be seen today for concerns she has about her medications. Left call back number [phone] Attempted to call pt back - no answer or vm. Wallace Cullens, RN

## 2018-01-02 ENCOUNTER — Encounter (INDEPENDENT_AMBULATORY_CARE_PROVIDER_SITE_OTHER): Payer: Self-pay | Admitting: Orthopaedic Surgery

## 2018-01-02 ENCOUNTER — Ambulatory Visit (INDEPENDENT_AMBULATORY_CARE_PROVIDER_SITE_OTHER): Payer: Medicare Other | Admitting: Orthopaedic Surgery

## 2018-01-02 VITALS — BP 125/72 | HR 71 | Ht 64.0 in | Wt 229.0 lb

## 2018-01-02 DIAGNOSIS — M542 Cervicalgia: Secondary | ICD-10-CM | POA: Diagnosis not present

## 2018-01-02 DIAGNOSIS — M545 Low back pain: Secondary | ICD-10-CM | POA: Diagnosis not present

## 2018-01-02 DIAGNOSIS — G8929 Other chronic pain: Secondary | ICD-10-CM

## 2018-01-02 NOTE — Progress Notes (Signed)
Office Visit Note   Patient: Tamara Henry           Date of Birth: 06/05/1961           MRN: [id number] Visit Date: 01/02/2018              Requested by: Carlyle Dolly, MD [place], [place] [facility] [place] [address] PCP: Carlyle Dolly, MD   Assessment & Plan: Visit Diagnoses:  1. Neck pain   2. Chronic bilateral low back pain without sciatica     Plan: She will continue walking program weight loss.  She has some mild to moderate knee osteoarthritis which is doing better.  Persistent low back problems with foraminal narrowing and some claudication symptoms.  She is looking into getting a stool that she can put in her kitchen so when she goes she can do some sitting some standing.  She has significant medical history of previous bilateral pulmonary embolisms.  Previous cardiac bypass surgery.  I plan to recheck her in 6 months.  Follow-Up Instructions: Return in about 6 months (around 07/04/2018).   Orders:  No orders of the defined types were placed in this encounter.  No orders of the defined types were placed in this encounter.     Procedures: No procedures performed   Clinical Data: No additional findings.   Subjective: Chief Complaint  Patient presents with  . Left Shoulder - Pain, Follow-up    S/p left shoulder scope/debridement for rotator cuff tear 02/19/17  . Lower Back - Pain, Follow-up    HPI patient returns for follow-up with some continued problems with left shoulder pain.  She had low back pain which she states still bothers her particularly with standing.  She said pain on the right side of her thoracic region.  She denies hand or right upper extremity symptoms.  She is using some tizanidine.  She is lost 16 pounds and is congratulated on her weight loss.  With her low back symptoms she gets relief with sitting.   Review of Systems 14 point review of systems updated unchanged from 06/27/2017 office visit other than as mentioned in HPI.  Previous  CABG procedure.  MRI cervical multilevel spondylosis.   Objective: Vital Signs: BP 125/72 (BP Location: Right Arm, Patient Position: Sitting, Cuff Size: Large)   Pulse 71   Ht 5\' 4"  (1.626 m)   Wt 229 lb (103.9 kg)   LMP  (LMP Unknown)   BMI 39.31 kg/m   Physical Exam  Constitutional: She is oriented to person, place, and time. She appears well-developed.  HENT:  Head: Normocephalic.  Right Ear: External ear normal.  Left Ear: External ear normal.  Eyes: Pupils are equal, round, and reactive to light.  Neck: No tracheal deviation present. No thyromegaly present.  Cardiovascular: Normal rate.  Pulmonary/Chest: Effort normal.  Abdominal: Soft.  Neurological: She is alert and oriented to person, place, and time.  Skin: Skin is warm and dry.  Psychiatric: She has a normal mood and affect. Her behavior is normal.    Ortho Exam patient is able to get her arm to overhead no knee effusion.  She is able ambulate heel toe gait.  Some discomfort with palpation lumbar spine.  No rash over exposed skin.  Specialty Comments:  No specialty comments available.  Imaging: IMPRESSION: L2-3:  Facet and ligamentous hypertrophy.  No compressive stenosis.  L3-4: Bilateral foraminal to extraforaminal protrusions more prominent on the left. Facet and ligamentous hypertrophy. Multifactorial stenosis. Potential for  compression of the exiting L3 nerves, particularly on the left.  L4-5: Bilateral foraminal to extraforaminal protrusions. Facet and ligamentous hypertrophy. Mild canal and lateral recess stenosis. Potential for compression of the exiting L4 nerves.  L5-S1: Left posterolateral prominent disc herniation. Subarticular lateral recess on the left that could compress the left S1 nerve.   Electronically Signed   By: Nelson Chimes M.D.   On: 06/15/2017 14:21   PMFS History: Patient Active Problem List   Diagnosis Date Noted  . Pre-operative clearance 12/11/2017  . Encounter for  medication review 12/11/2017  . Hyperglycemia 11/30/2017  . Vaginal bleeding 10/09/2017  . Postmenopausal vaginal bleeding 10/04/2017  . Cough 10/04/2017  . Diarrhea 08/04/2017  . Arm pain, anterior, right 06/17/2017  . OSA on CPAP 05/03/2017  . Morbid obesity due to excess calories ([place]) 05/03/2017  . Microscopic hematuria 03/27/2017  . Impingement syndrome of left shoulder 02/19/2017  . Hypersomnia 12/12/2016  . REM behavioral disorder 12/12/2016  . Asthma 12/12/2016  . Neck pain 11/17/2016  . CKD (chronic kidney disease), stage III ([place]) 11/02/2016  . CAD (coronary artery disease), native coronary artery 10/26/2016  . Poor social situation 10/13/2016  . Leg pain 08/30/2016  . Bilateral low back pain without sciatica 08/30/2016  . 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 Bilateral pulmonary embolism ([place]) 05/17/2016  . Hyperlipidemia 05/17/2016  . Depression 05/17/2016  . Diabetic peripheral neuropathy ([place]) 05/17/2016  . Neuritis of upper extremity, C7 01/15/2015  . Diabetes ([place]) 01/14/2015  . Hypertension 01/14/2015  . Numbness and tingling of right arm 01/14/2015   Past Medical History:  Diagnosis Date  . Acid reflux   . Allergy    seasonal  . Anxiety   . Arthritis    lower back, feet  . Asthma    albuterol inhaler - rarely uses - only needs when she gets sick  . Back pain   . Bilateral pulmonary embolism ([place]) 05/2016  . Bipolar disorder ([place])   . CAD in native artery    S/P NSTEMI with cath showing severe 3 vessel ASCAD s/p CABGx4 07/28/16.  . Cataracts, bilateral   . CKD (chronic kidney disease), stage III (HCC)    borderline CKD II-III  . Cocaine abuse ([place])    In remission. Stopped using in early 2000's  . Depression   . Diabetes mellitus ([place])    Type II  . GERD (gastroesophageal reflux disease)   . Glaucoma   . High cholesterol   . History of blood transfusion 2017  . History of pulmonary embolus (PE)   . 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 Hx of chest tube placement right    . Hypertension   . Morbid obesity ([place])   . Myocardial infarction ([place]) 2017  . Neuromuscular disorder (HCC)    neuropathy in hands and feet  . Neuropathy    hands and feet  . Pneumonia   . Postoperative anemia 07/2016  . PTSD (post-traumatic stress disorder)   . Sleep apnea    Does not use cpap  . Stroke [facility]) 2005   no residual  . SVD (spontaneous vaginal delivery)    x 4    Family History  Problem Relation Age of Onset  . Pancreatitis Mother   . Diabetes type II Mother   . Diabetes Mother   . CAD Father   . Heart attack Father   . Diabetes type II Sister   . Diabetes Sister   . Diabetes type II Brother   .  Diabetes Brother   . Diabetes Brother   . Diabetes Brother   . Clotting disorder Son        heart attack x 3   . Breast cancer Maternal Aunt   . Colon cancer Neg Hx   . Stomach cancer Neg Hx     Past Surgical History:  Procedure Laterality Date  . CARDIAC CATHETERIZATION N/A 07/25/2016   Procedure: Left Heart Cath and Coronary Angiography;  Surgeon: Peter M Martinique, MD;  Location: [facility] [place] [facility];  Service: Cardiovascular;  Laterality: N/A;  . COLONOSCOPY    . COLONOSCOPY W/ POLYPECTOMY    . CORONARY ARTERY BYPASS GRAFT N/A 07/28/2016   Procedure: CORONARY ARTERY BYPASS GRAFTING (CABG)x4 using left internal mammary and endoscopic harvest of right greater saphenous vein;  Surgeon: Ivin Poot, MD;  Location: [facility];  Service: Open Heart Surgery;  Laterality: N/A;  . DENTAL SURGERY     to removed broken teeth  . POLYPECTOMY    . PULMONARY EMBOLISM SURGERY  2017   [facility]  . SHOULDER ARTHROSCOPY WITH ROTATOR CUFF REPAIR Left 02/19/2017   Procedure: LEFT SHOULDER ARTHROSCOPY with debridement andROTATOR CUFF REPAIR;  Surgeon: [name], MD;  Location: [facility];  Service: Orthopedics;  Laterality: Left;  . TEE WITHOUT CARDIOVERSION N/A 07/28/2016   Procedure: TRANSESOPHAGEAL ECHOCARDIOGRAM (TEE);  Surgeon: Ivin Poot, MD;  Location: [facility];  Service: Open  Heart Surgery;  Laterality: N/A;  . TONSILLECTOMY    . TUBAL LIGATION     Social History   Occupational History  . Occupation: disabled  Tobacco Use  . Smoking status: Former Smoker    Packs/day: 2.00    Years: 10.00    Pack years: 20.00    Types: Cigarettes    Last attempt to quit: 09/11/2005    Years since quitting: 12.3  . Smokeless tobacco: Never Used  Substance and Sexual Activity  . Alcohol use: No  . Drug use: Not Currently    Types: Cocaine    Comment: History Cocaine - last use in 2000's  . Sexual activity: Not Currently    Birth control/protection: Surgical

## 2018-01-04 ENCOUNTER — Telehealth: Payer: Self-pay

## 2018-01-04 NOTE — Telephone Encounter (Signed)
[employer] left message on nurse line that Novolog is not covered. Humalog is the preferred alternative. Please send new Rx for Humalog or let RN Clinic know if you'd like to attempt PA for Novolog.   Danley Danker, RN [facility] RN)

## 2018-01-07 MED ORDER — INSULIN LISPRO 100 UNIT/ML (KWIKPEN): 5.0000 [IU] | PEN_INJECTOR | Freq: Two times a day (BID) | SUBCUTANEOUS | 1 refills | Status: DC

## 2018-01-07 NOTE — Telephone Encounter (Signed)
Rx changed to Humalog. Called patient and let her know.   Smitty Cords, MD [place], PGY-3

## 2018-01-09 ENCOUNTER — Ambulatory Visit: Payer: Medicare Other | Admitting: Licensed Clinical Social Worker

## 2018-01-09 DIAGNOSIS — R4589 Other symptoms and signs involving emotional state: Secondary | ICD-10-CM

## 2018-01-09 DIAGNOSIS — F329 Major depressive disorder, single episode, unspecified: Secondary | ICD-10-CM

## 2018-01-09 NOTE — Progress Notes (Signed)
Type of Service: [place] Visit Total time:35 minutes :  Interpreter:No.    Reason for follow-up: brief intervention to assist patient with managing symptoms of anxiety and depression, as well as family stressors . Reports difficulty in daily functions when she has low point in her life. Patient is pleasant and engaged in conversation.   Appearance:Well Groomed as she is going to an event when she leaves the office; Thought process: Coherent; Affect: Appropriate.   Denies SI today reporting having thoughts several weeks ago during a "low time" did not have a plan and states she would not act on the thoughts due to strong faith. States thoughts were due to many family stressors. ( Dau and son in [place]; patient has health concerns; niece completes suicide.  Patient states she continues to have low points about every 4 to 6 months and needs to talk to PCP about this.  She describe "highs points" as wanting to go to church and to the gym.  When she is low she is sad, it is difficult to do anything and she only wants to stay in bed.     GAD 7 : Generalized Anxiety Score 01/09/2018 11/19/2017 08/29/2017 07/25/2017  Nervous, Anxious, on Edge 3 3 0 0  Control/stop worrying 3 3 0 3  Worry too much - different things 3 3 0 3  Trouble relaxing 3 3 0 1  Restless 0 3 0 1  Easily annoyed or irritable 2 3 0 1  Afraid - awful might happen 3 3 0 3  Total GAD 7 Score 17 21 0 12  Anxiety Difficulty - - - Somewhat difficult   Depression screen [facility] 2/9 01/09/2018 12/11/2017 12/04/2017  Decreased Interest 0 3 1  Down, Depressed, Hopeless 3 3 1   PHQ - 2 Score 3 6 2   Altered sleeping 3 3 -  Tired, decreased energy 2 3 -  Change in appetite 3 - -  Feeling bad or failure about yourself  3 - -  Trouble concentrating 2 - -  Moving slowly or fidgety/restless 0 - -  Suicidal thoughts 2 - -  PHQ-9 Score 18 12 -  Difficult doing work/chores - - -  Some recent data might be hidden   Goals: Patient will  reduce symptoms of: anxiety, depression and stress , and increase  ability NA:TFTDDU skills, self-management skills and stress reduction. Intervention: Behavioral Activation and Supportive Counseling, Reflective listening, Behavioral Therapy (Relaxed breathing), Psychoeducation and Consult MD   Issues discussed: Safety plan  ; support system ;  Community/faith support; things patient can do if she has thoughts again ; reviewed  And provided copy  of "SI feelings, How to help yourself". Assessment:Patient continues to experience symptoms of depression and anxiety.  She takes Celexa daily as prescribed. She is not sure the medication is working. Symptoms also exacerbated by family stressors.  Patient is doing well today and is open to talking with PCP about possible medication adjustment.  Patient may benefit from, and is in agreement to continue further assessment and brief therapeutic interventions to assist with managing her symptoms.  Plan: 1. Patient will F/U with LCSW in 1 week 2. Behavioral recommendations: continue relaxed breathing and behavior activation 3. Referral: F/U with PCP  Casimer Lanius, LCSW Licensed Clinical Social Worker [place]   [phone] 11:26 AM

## 2018-01-16 ENCOUNTER — Encounter: Payer: Self-pay | Admitting: Family Medicine

## 2018-01-16 ENCOUNTER — Ambulatory Visit: Payer: Medicare Other | Admitting: Licensed Clinical Social Worker

## 2018-01-16 ENCOUNTER — Other Ambulatory Visit: Payer: Self-pay

## 2018-01-16 ENCOUNTER — Ambulatory Visit (INDEPENDENT_AMBULATORY_CARE_PROVIDER_SITE_OTHER): Payer: Medicare Other | Admitting: Family Medicine

## 2018-01-16 VITALS — BP 122/64 | HR 78 | Temp 98.5°F | Ht 64.0 in | Wt 226.0 lb

## 2018-01-16 DIAGNOSIS — M549 Dorsalgia, unspecified: Secondary | ICD-10-CM | POA: Diagnosis not present

## 2018-01-16 DIAGNOSIS — E0800 Diabetes mellitus due to underlying condition with hyperosmolarity without nonketotic hyperglycemic-hyperosmolar coma (NKHHC): Secondary | ICD-10-CM

## 2018-01-16 DIAGNOSIS — F331 Major depressive disorder, recurrent, moderate: Secondary | ICD-10-CM | POA: Diagnosis not present

## 2018-01-16 DIAGNOSIS — F439 Reaction to severe stress, unspecified: Secondary | ICD-10-CM

## 2018-01-16 DIAGNOSIS — R6884 Jaw pain: Secondary | ICD-10-CM

## 2018-01-16 LAB — POCT GLYCOSYLATED HEMOGLOBIN (HGB A1C): HEMOGLOBIN A1C: 8.7

## 2018-01-16 MED ORDER — TIZANIDINE HCL 4 MG PO TABS: 4.0000 mg | ORAL_TABLET | Freq: Four times a day (QID) | ORAL | 0 refills | Status: DC | PRN

## 2018-01-16 MED ORDER — CLINDAMYCIN HCL 300 MG PO CAPS: 300.0000 mg | ORAL_CAPSULE | Freq: Three times a day (TID) | ORAL | 0 refills | Status: DC

## 2018-01-16 NOTE — Progress Notes (Signed)
Type of Service: [place] F/U Visit Total time:30 minutes :  Interpreter:No.    Reason for follow-up: Continue brief intervention to assist patient with managing symptoms of anxiety and depression, as well as managing stress at home . Patient is pleasant and engaged in conversation.   Appearance:Neat ; Thought process: Coherent; Affect: Appropriate: No plan to harm self or others   GAD 7 : Generalized Anxiety Score 01/16/2018 01/09/2018 11/19/2017 08/29/2017  Nervous, Anxious, on Edge 1 3 3  0  Control/stop worrying 1 3 3  0  Worry too much - different things 1 3 3  0  Trouble relaxing 1 3 3  0  Restless 1 0 3 0  Easily annoyed or irritable 1 2 3  0  Afraid - awful might happen 1 3 3  0  Total GAD 7 Score 7 17 21  0  Anxiety Difficulty - - - -   Depression screen [facility] 2/9 01/16/2018 01/16/2018 01/09/2018  Decreased Interest 1 0 0  Down, Depressed, Hopeless 1 0 3  PHQ - 2 Score 2 0 3  Altered sleeping 1 - 3  Tired, decreased energy 1 - 2  Change in appetite 1 - 3  Feeling bad or failure about yourself  1 - 3  Trouble concentrating 1 - 2  Moving slowly or fidgety/restless 1 - 0  Suicidal thoughts 0 - 2  PHQ-9 Score 8 - 18  Difficult doing work/chores - - -  Some recent data might be hidden   Patient is making progress towards goal.  States she is feeling better. This is also evident by PHQ-9/GAD score above. Goals: Patient will reduce symptoms of: anxiety, depression, mood instability and stress , and increase  ability BT:DHRCBU skills, self-management skills and stress reduction, . Intervention: Solution-Focused Strategies, Behavioral Activation and Supportive Counseling, Reflective listening,  Data processing manager and Referral to [place] provider   Issues discussed: decrease in GAD and PHQ-9 scores  ; events patient attended last week ;  Getting out of the house; concerns with housing and wanting to move out of her daughters home ; managing her money paying for  transportation; reconnecting to previous mental health provider for going evaluation, medication management and treatment. Assessment:Patient continues to experience symptoms of depression and anxiety.  Symptoms exacerbated by family stressors at home.  Patient also has hx of PTSD which needs to be treated. PCP has referred patient to psychiatry for further mental health evaluation, medication management and going therapy.  Patient is hesitate to go before her scheduled surgery but will call the end of May.  Patient may benefit from, and is in agreement to continue further assessment and brief therapeutic interventions to assist with managing her symptoms, until connected to provided for ongoing therapy.  Plan: 1. Patient will F/U with LCSW in 2 weeks 2. Behavioral recommendations: continue relaxed breathing and behavioral activation. 3. Referral: Community Resources:  Housing, [facility] the [employer], [employer] and Veatrice [place], [place] Licensed Clinical Social Worker [place]   [phone] 10:14 AM

## 2018-01-16 NOTE — Assessment & Plan Note (Signed)
Left mandibular jaw pain for the last month.  No obvious signs of TMJ.  No obvious signs of dental abscess.  Less likely osteonecrosis of the mandible (not on any bisphosphonate). Does have some fullness in the submandibular area.  She may have a tooth infection that we are unable to see at this time. -Advised patient to schedule appointment with her dentist within the next couple weeks - We will start with clindamycin 300 mg 3 times daily until she can see her dentist (allergic to penicillin) -Return precautions discussed - If she continues to have symptoms and dentist does not feel that this is an infectious etiology, may consider referral to ENT

## 2018-01-16 NOTE — Assessment & Plan Note (Addendum)
Currently having pain in the mid right latissimus dorsi, suspect muscle strain based off of exam - Zanaflex 4 mg every 6 hours as needed -Can also use Tylenol as needed -Apply heat to area a few times a day -Stay active -Will follow up in 1 month if no improvement

## 2018-01-16 NOTE — Progress Notes (Signed)
Subjective:    Patient ID: Tamara Henry , female   DOB: March 13, 1961 , 57 y.o..   MRN: [id number]  HPI  Tamara Henry is a 58 yo F with PMH ofCAD s/p CABG, PE, HTN,T2DM, CKDIII, OSA on CPAP, Asthma here for   1. Right sided Back Pain:   Back pain began 1 month ago  Pain is described as stabbing Worse with any quick movements. Patient has tried no. Pain radiates: no. History of trauma or injury: no Patient believes might be causing their pain: unsure  Prior history of similar pain: no History of cancer: no Weak immune system:  no History of IV drug use: no History of steroid use: no  Symptoms Incontinence of bowel or bladder: no Numbness of leg: no Fever: no Rest or Night pain: no Weight Loss:  no Rash: no  2. Left Jaw Pain: Left sided jaw pain that started about 1 month ago. She has partial denture on the top. Worse when opening jaw to chew and laying on her left side. Hurts more when chewing hard foods such as meat. Doesn't think she grinds her teeth at night. She does not wake up with jaw pain.   3. Depression: Notes that she has been having more "low times" than happy times. Her niece committed suicide before Easter and that was hard for her. She notes that she has a history of Bipolar and PTSD. She notes that she was seen by [facility] in the past but has not been in awhile. Currently taking Celexa.  Review of Systems: Per HPI.   Past Medical History: Patient Active Problem List   Diagnosis Date Noted  . Pain in lower jaw 01/16/2018  . Vaginal bleeding 10/09/2017  . Postmenopausal vaginal bleeding 10/04/2017  . Diarrhea 08/04/2017  . Arm pain, anterior, right 06/17/2017  . OSA on CPAP 05/03/2017  . Morbid obesity due to excess calories ([place]) 05/03/2017  . Microscopic hematuria 03/27/2017  . Impingement syndrome of left shoulder 02/19/2017  . Hypersomnia 12/12/2016  . REM behavioral disorder 12/12/2016  . Asthma 12/12/2016  . Neck pain  11/17/2016  . CKD (chronic kidney disease), stage III ([place]) 11/02/2016  . CAD (coronary artery disease), native coronary artery 10/26/2016  . Poor social situation 10/13/2016  . Leg pain 08/30/2016  . Back pain 08/30/2016  . 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 Hyperlipidemia 05/17/2016  . Depression 05/17/2016  . Diabetic peripheral neuropathy ([place]) 05/17/2016  . Neuritis of upper extremity, C7 01/15/2015  . Diabetes ([place]) 01/14/2015  . Hypertension 01/14/2015  . Numbness and tingling of right arm 01/14/2015    Medications: reviewed   Social Hx:  reports that she quit smoking about 12 years ago. Her smoking use included cigarettes. She has a 20.00 pack-year smoking history. She has never used smokeless tobacco.   Objective:   BP 122/64   Pulse 78   Temp 98.5 F (36.9 C) (Oral)   Ht 5\' 4"  (1.626 m)   Wt 226 lb (102.5 kg)   LMP  (LMP Unknown)   SpO2 97%   BMI 38.79 kg/m  Physical Exam  Gen: NAD, alert, cooperative with exam, well-appearing HEENT: NCAT, PERRL, clear conjunctiva, oropharynx clear, supple neck, no signs of gingival erythema or discharge. Mandibular teeth non tender to palpation with tongue depressor.  Jaw: Left-sided mandibular jaw with tenderness to palpation in the submandibular gland area Cardiac: Regular rate and rhythm, normal S1/S2, no murmur, no edema, capillary refill brisk  Psych: good insight,  normal mood and affect Back Exam:  Inspection: Unremarkable  Palpable tenderness: Tender to palpation on right mid latissimus dorsi Range of Motion: Full in all directions however painful when rotating left Leg strength: Quad: 5/5 Hamstring: 5/5 Hip flexor: 5/5 Hip abductors: 5/5  Strength at foot: Plantar-flexion: 5/5 Dorsi-flexion: 5/5 Eversion: 5/5 Inversion: 5/5  Sensory change: Gross sensation intact to all lumbar and sacral dermatomes.  Reflexes Babinski's downgoing.  Gait unremarkable.  Assessment & Plan:  Back pain Currently having pain in the mid right  latissimus dorsi, suspect muscle strain based off of exam - Zanaflex 4 mg every 6 hours as needed -Can also use Tylenol as needed -Apply heat to area a few times a day -Stay active -Will follow up in 1 month if no improvement  Depression Uncontrolled depression per patient's report.  Did not do PHQ 9 today as be mostly discussed her back and jaw pain.  She does have a history of self-reported PTSD and bipolar disorder but is only on Celexa 20 mg daily -Advised patient to return psychiatryt for further medication management given complex psychiatric history -Provided patient a list of psychiatrists in the area that she can call and try to schedule appointment with -Can continue 20 mg daily of Celexa for now, if she does have bipolar would suggest that she be on a mood stabilizer  Pain in lower jaw Left mandibular jaw pain for the last month.  No obvious signs of TMJ.  No obvious signs of dental abscess.  Less likely osteonecrosis of the mandible (not on any bisphosphonate). Does have some fullness in the submandibular area.  She may have a tooth infection that we are unable to see at this time. -Advised patient to schedule appointment with her dentist within the next couple weeks - We will start with clindamycin 300 mg 3 times daily until she can see her dentist (allergic to penicillin) -Return precautions discussed - If she continues to have symptoms and dentist does not feel that this is an infectious etiology, may consider referral to ENT   A1c collected today.  Did not have enough time to thoroughly discussed diabetes as we addressed other issues that patient wanted to discuss, asked her to come back in 1 to 2 weeks to have a thorough diabetes visit. Orders Placed This Encounter  Procedures  . HgB A1c   Meds ordered this encounter  Medications  . tiZANidine (ZANAFLEX) 4 MG tablet    Sig: Take 1 tablet (4 mg total) by mouth every 6 (six) hours as needed for muscle spasms.    Dispense:   30 tablet    Refill:  0  . clindamycin (CLEOCIN) 300 MG capsule    Sig: Take 1 capsule (300 mg total) by mouth 3 (three) times daily.    Dispense:  90 capsule    Refill:  0    Smitty Cords, MD [place], PGY-3

## 2018-01-16 NOTE — Patient Instructions (Signed)
Thank you for coming in today, it was so nice to see you! Today we talked about:    Back pain: I believe your back pain is from a muscle strain.  Please apply heat to this area couple times a day.  You can also try the Zanaflex, I have refilled this for you.  You may also try Tylenol in addition to Zanaflex for additional pain control  Jaw pain; you may have a tooth infection.  We are giving a prescription for clindamycin, take 1 pill 3 times a day until you are able to see your dentist.  We would like for you to see your dentist within the next couple weeks to ensure you do not have an infection.  Please follow up to discuss your diabetes of the next couple weeks. You can schedule this appointment at the front desk before you leave or call the clinic.  If you have any questions or concerns, please do not hesitate to call the office at [phone]. You can also message me directly via MyChart.   Sincerely,  Smitty Cords, MD

## 2018-01-16 NOTE — Assessment & Plan Note (Addendum)
Uncontrolled depression per patient's report.  Did not do PHQ 9 today as be mostly discussed her back and jaw pain.  She does have a history of self-reported PTSD and bipolar disorder but is only on Celexa 20 mg daily -Advised patient to return psychiatryt for further medication management given complex psychiatric history -Provided patient a list of psychiatrists in the area that she can call and try to schedule appointment with -Can continue 20 mg daily of Celexa for now, if she does have bipolar would suggest that she be on a mood stabilizer

## 2018-01-21 DIAGNOSIS — N183 Chronic kidney disease, stage 3 (moderate): Secondary | ICD-10-CM | POA: Diagnosis not present

## 2018-01-21 DIAGNOSIS — N189 Chronic kidney disease, unspecified: Secondary | ICD-10-CM | POA: Diagnosis not present

## 2018-01-21 DIAGNOSIS — N2581 Secondary hyperparathyroidism of renal origin: Secondary | ICD-10-CM | POA: Diagnosis not present

## 2018-01-24 ENCOUNTER — Other Ambulatory Visit: Payer: Self-pay | Admitting: Family Medicine

## 2018-01-24 DIAGNOSIS — Z1231 Encounter for screening mammogram for malignant neoplasm of breast: Secondary | ICD-10-CM

## 2018-01-25 ENCOUNTER — Telehealth: Payer: Self-pay | Admitting: Family Medicine

## 2018-01-25 ENCOUNTER — Other Ambulatory Visit: Payer: Medicare Other

## 2018-01-25 DIAGNOSIS — Z79899 Other long term (current) drug therapy: Secondary | ICD-10-CM | POA: Diagnosis not present

## 2018-01-25 LAB — HEPATIC FUNCTION PANEL
ALK PHOS: 116 IU/L (ref 39–117)
ALT: 20 IU/L (ref 0–32)
AST: 14 IU/L (ref 0–40)
Albumin: 3.3 g/dL — ABNORMAL LOW (ref 3.5–5.5)
BILIRUBIN TOTAL: 0.2 mg/dL (ref 0.0–1.2)
BILIRUBIN, DIRECT: 0.09 mg/dL (ref 0.00–0.40)
Total Protein: 5.9 g/dL — ABNORMAL LOW (ref 6.0–8.5)

## 2018-01-25 LAB — LIPID PANEL
Chol/HDL Ratio: 4.1 ratio (ref 0.0–4.4)
Cholesterol, Total: 139 mg/dL (ref 100–199)
HDL: 34 mg/dL — AB (ref >39)
LDL Calculated: 79 mg/dL (ref 0–99)
TRIGLYCERIDES: 132 mg/dL (ref 0–149)
VLDL Cholesterol Cal: 26 mg/dL (ref 5–40)

## 2018-01-25 NOTE — Telephone Encounter (Signed)
Patient came to office ask to give envelope to pcp. Put in Dr. Thad Ranger mail box.

## 2018-01-29 DIAGNOSIS — D631 Anemia in chronic kidney disease: Secondary | ICD-10-CM | POA: Diagnosis not present

## 2018-01-29 DIAGNOSIS — N183 Chronic kidney disease, stage 3 (moderate): Secondary | ICD-10-CM | POA: Diagnosis not present

## 2018-01-29 DIAGNOSIS — N2581 Secondary hyperparathyroidism of renal origin: Secondary | ICD-10-CM | POA: Diagnosis not present

## 2018-01-29 DIAGNOSIS — I129 Hypertensive chronic kidney disease with stage 1 through stage 4 chronic kidney disease, or unspecified chronic kidney disease: Secondary | ICD-10-CM | POA: Diagnosis not present

## 2018-01-30 ENCOUNTER — Ambulatory Visit (INDEPENDENT_AMBULATORY_CARE_PROVIDER_SITE_OTHER): Payer: Medicare Other | Admitting: Family Medicine

## 2018-01-30 ENCOUNTER — Other Ambulatory Visit: Payer: Self-pay | Admitting: Obstetrics & Gynecology

## 2018-01-30 ENCOUNTER — Other Ambulatory Visit: Payer: Self-pay

## 2018-01-30 ENCOUNTER — Encounter: Payer: Self-pay | Admitting: Family Medicine

## 2018-01-30 VITALS — BP 122/64 | HR 77 | Temp 98.4°F | Ht 64.0 in | Wt 235.2 lb

## 2018-01-30 DIAGNOSIS — N183 Chronic kidney disease, stage 3 unspecified: Secondary | ICD-10-CM

## 2018-01-30 DIAGNOSIS — E118 Type 2 diabetes mellitus with unspecified complications: Secondary | ICD-10-CM

## 2018-01-30 DIAGNOSIS — Z794 Long term (current) use of insulin: Secondary | ICD-10-CM

## 2018-01-30 DIAGNOSIS — M549 Dorsalgia, unspecified: Secondary | ICD-10-CM

## 2018-01-30 MED ORDER — ACETAMINOPHEN ER 650 MG PO TBCR: 650.0000 mg | EXTENDED_RELEASE_TABLET | Freq: Three times a day (TID) | ORAL | 1 refills | Status: DC | PRN

## 2018-01-30 NOTE — Assessment & Plan Note (Signed)
Uncontrolled per last A1c of 8.7.  Patient does endorse a few hypoglycemic episodes over the last month, but this is in the setting of taking her short acting insulin and not having a meal.  She continues to try and decrease sugars and carbohydrates from her diet.  Feel that it is reasonable to continue current insulin regimen for now since her fasting glucoses are in the mid to low 100s and she is doing diet modification and exercise - Continue Lantus 50 units at night and NovoLog 3 units before meals, she will not take 3 units of NovoLog if she is not going to eat a meal - Continue a carb modified diet -Follow-up in 3 months for next A1c check

## 2018-01-30 NOTE — Progress Notes (Signed)
Subjective:    Patient ID: Tamara Henry , female   DOB: 1960/12/20 , 57 y.o..   MRN: [id number]  HPI  Tamara Henry is a 57 yo F with PMH ofCAD s/p CABG, PE, HTN,T2DM, CKDIII, OSA on CPAP, Asthma here for  Chief Complaint  Patient presents with  . Spasms    1. Chronic Diabetes  Disease Monitoring  Blood Sugar Ranges: Usually in the mid 100 in the morning.  She notes that last night she had an episode of hypoglycemia, her blood sugar was 40.  She notes that she took her short acting insulin and did not eat dinner.  She was able to eat some candy and an orange and it went back up.  She notes that she has had about 2 episodes of hypoglycemia in the last month.  Polyuria: no   Visual problems: yes, follows with an ophthalmologist  Last hemoglobin A1C:  Lab Results  Component Value Date   HGBA1C 8.7 01/16/2018   Medication Compliance: yes (Lantus 50 units at night and NovoLog 3 units before meals Medication Side Effects  Hypoglycemia: yes, see above  [place] Exam: yes, she was following with the eye doctor  Foot Exam: Up-to-date  Diet pattern: Patient does not drink sugary beverages, she does drink diet sodas.  She notes that she does eat plenty of carbohydrates such as bread and rice  [facility] due to transportation issues.  2, Back pain and spasms; patient notes that she initially had right lower back pain but now her back pain has moved upwards.  She notes that the back pain is in the middle of the back and worse with movement.  She notes that she has excruciating pain when going from a sitting to standing position.  She is worried that she may have pneumonia because this is how her pneumonia presented before.  She has had no trauma to the area.  She has tried heating pads which did help her.  She has been taking muscle relaxers which have not provided any help, she is tried them about twice  a day.  Denies any falls, numbness tingling, fecal incontinence.  Review of Systems: Per HPI.   Past Medical History: Patient Active Problem List   Diagnosis Date Noted  . Pain in lower jaw 01/16/2018  . Vaginal bleeding 10/09/2017  . Postmenopausal vaginal bleeding 10/04/2017  . Diarrhea 08/04/2017  . Arm pain, anterior, right 06/17/2017  . OSA on CPAP 05/03/2017  . Morbid obesity due to excess calories ([place]) 05/03/2017  . Microscopic hematuria 03/27/2017  . Impingement syndrome of left shoulder 02/19/2017  . Hypersomnia 12/12/2016  . REM behavioral disorder 12/12/2016  . Asthma 12/12/2016  . Neck pain 11/17/2016  . CKD (chronic kidney disease), stage III ([place]) 11/02/2016  . CAD (coronary artery disease), native coronary artery 10/26/2016  . Poor social situation 10/13/2016  . Leg pain 08/30/2016  . Back pain 08/30/2016  . 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 Hyperlipidemia 05/17/2016  . Depression 05/17/2016  . Diabetic peripheral neuropathy ([place]) 05/17/2016  . Neuritis of upper extremity, C7 01/15/2015  . Diabetes ([place]) 01/14/2015  . Hypertension 01/14/2015  . Numbness and tingling of right arm 01/14/2015    Medications: reviewed   Social Hx:  reports that she quit smoking about 12 years ago. Her smoking use included cigarettes. She has a 20.00 pack-year smoking history. She has never used  smokeless tobacco.   Objective:   BP 122/64   Pulse 77   Temp 98.4 F (36.9 C) (Oral)   Ht 5\' 4"  (1.626 m)   Wt 235 lb 3.2 oz (106.7 kg)   LMP  (LMP Unknown)   SpO2 99%   BMI 40.37 kg/m  Physical Exam  Gen: NAD, alert, cooperative with exam, well-appearing Respiratory: non-labored breathing Psych: good insight, normal mood and affect Back Exam:  Inspection: Unremarkable  Palpable tenderness: Tender to palpation on right mid latissimus dorsi Range of Motion: Full in all directions however painful when rotating left Leg strength: Quad: 5/5 Hamstring: 5/5 Hip flexor: 5/5 Hip  abductors: 5/5  Strength at foot: Plantar-flexion: 5/5 Dorsi-flexion: 5/5 Eversion: 5/5 Inversion: 5/5  Sensory change: Gross sensation intact to all lumbar and sacral dermatomes.  Gait unremarkable.   Assessment & Plan:  Diabetes ([place]) Uncontrolled per last A1c of 8.7.  Patient does endorse a few hypoglycemic episodes over the last month, but this is in the setting of taking her short acting insulin and not having a meal.  She continues to try and decrease sugars and carbohydrates from her diet.  Feel that it is reasonable to continue current insulin regimen for now since her fasting glucoses are in the mid to low 100s and she is doing diet modification and exercise - Continue Lantus 50 units at night and NovoLog 3 units before meals, she will not take 3 units of NovoLog if she is not going to eat a meal - Continue a carb modified diet -Follow-up in 3 months for next A1c check  Back pain Continues to have pain in the mid right latissimus dorsi, pain still consistent with muscle strain.  Taking Zanaflex 4 mg about twice a day without relief -Discussed taking 650 mg of Tylenol twice daily -Discussed that she can take the Zanaflex 4 mg every 6 hours as needed - Continue heating pad -Continue to stay active -Referral placed to physical therapy - Follow-up in 1 month  Orders Placed This Encounter  Procedures  . Ambulatory referral to Physical Therapy    Referral Priority:   Routine    Referral Type:   Physical Medicine    Referral Reason:   Specialty Services Required    Requested Specialty:   Physical Therapy    Number of Visits Requested:   1   Meds ordered this encounter  Medications  . acetaminophen (TYLENOL) 650 MG CR tablet    Sig: Take 1 tablet (650 mg total) by mouth every 8 (eight) hours as needed for pain.    Dispense:  90 tablet    Refill:  1    Smitty Cords, MD [place], PGY-3

## 2018-01-30 NOTE — Patient Instructions (Signed)
Thank you for coming in today, it was so nice to see you! Today we talked about:    Back Pain: Continue taking the muscle relaxer as prescribed.  You can take this up to 3 times a day for the knee.  I have refilled your Tylenol.  You can take 1 pill of Tylenol in the morning and 1 at night with your muscle relaxer pill.  I have also placed a referral to physical therapy.  Someone should call you to schedule this  Diabetes: Continue your current medicines.  The next time we will need to check your A1c is in August  Please follow up in 3 months or sooner if needed.   If you have any questions or concerns, please do not hesitate to call the office at [phone]. You can also message me directly via MyChart.   Sincerely,  Smitty Cords, MD

## 2018-01-30 NOTE — Assessment & Plan Note (Addendum)
Continues to have pain in the mid right latissimus dorsi, pain still consistent with muscle strain.  Taking Zanaflex 4 mg about twice a day without relief -Discussed taking 650 mg of Tylenol twice daily -Discussed that she can take the Zanaflex 4 mg every 6 hours as needed -Avoid NSAIDs for now due to CKD - Continue heating pad -Continue to stay active -Referral placed to physical therapy - Follow-up in 1 month

## 2018-01-31 ENCOUNTER — Other Ambulatory Visit: Payer: Self-pay

## 2018-01-31 ENCOUNTER — Ambulatory Visit (HOSPITAL_COMMUNITY): Payer: Medicare Other | Admitting: Anesthesiology

## 2018-01-31 ENCOUNTER — Ambulatory Visit (HOSPITAL_COMMUNITY)
Admission: RE | Admit: 2018-01-31 | Discharge: 2018-01-31 | Disposition: A | Discharge status: Home / Self Care | Payer: Medicare Other | Source: Ambulatory Visit | Attending: Obstetrics & Gynecology | Admitting: Obstetrics & Gynecology

## 2018-01-31 ENCOUNTER — Encounter (HOSPITAL_COMMUNITY)
Admission: RE | Disposition: A | Discharge status: Home / Self Care | Payer: Self-pay | Source: Ambulatory Visit | Attending: Obstetrics & Gynecology

## 2018-01-31 ENCOUNTER — Encounter (HOSPITAL_COMMUNITY): Payer: Self-pay | Admitting: Emergency Medicine

## 2018-01-31 DIAGNOSIS — Z794 Long term (current) use of insulin: Secondary | ICD-10-CM | POA: Insufficient documentation

## 2018-01-31 DIAGNOSIS — S0242XA Fracture of alveolus of maxilla, initial encounter for closed fracture: Secondary | ICD-10-CM

## 2018-01-31 DIAGNOSIS — Z79899 Other long term (current) drug therapy: Secondary | ICD-10-CM | POA: Diagnosis not present

## 2018-01-31 DIAGNOSIS — I2601 Septic pulmonary embolism with acute cor pulmonale: Secondary | ICD-10-CM

## 2018-01-31 DIAGNOSIS — I129 Hypertensive chronic kidney disease with stage 1 through stage 4 chronic kidney disease, or unspecified chronic kidney disease: Secondary | ICD-10-CM | POA: Insufficient documentation

## 2018-01-31 DIAGNOSIS — N183 Chronic kidney disease, stage 3 (moderate): Secondary | ICD-10-CM | POA: Diagnosis not present

## 2018-01-31 DIAGNOSIS — N950 Postmenopausal bleeding: Secondary | ICD-10-CM | POA: Diagnosis not present

## 2018-01-31 DIAGNOSIS — E1122 Type 2 diabetes mellitus with diabetic chronic kidney disease: Secondary | ICD-10-CM | POA: Diagnosis not present

## 2018-01-31 DIAGNOSIS — Z7982 Long term (current) use of aspirin: Secondary | ICD-10-CM | POA: Diagnosis not present

## 2018-01-31 DIAGNOSIS — Z6841 Body Mass Index (BMI) 40.0 and over, adult: Secondary | ICD-10-CM | POA: Diagnosis not present

## 2018-01-31 DIAGNOSIS — K219 Gastro-esophageal reflux disease without esophagitis: Secondary | ICD-10-CM | POA: Diagnosis not present

## 2018-01-31 DIAGNOSIS — N840 Polyp of corpus uteri: Secondary | ICD-10-CM

## 2018-01-31 DIAGNOSIS — E7800 Pure hypercholesterolemia, unspecified: Secondary | ICD-10-CM | POA: Insufficient documentation

## 2018-01-31 DIAGNOSIS — Z87891 Personal history of nicotine dependence: Secondary | ICD-10-CM | POA: Diagnosis not present

## 2018-01-31 DIAGNOSIS — I10 Essential (primary) hypertension: Secondary | ICD-10-CM | POA: Diagnosis not present

## 2018-01-31 DIAGNOSIS — I2510 Atherosclerotic heart disease of native coronary artery without angina pectoris: Secondary | ICD-10-CM | POA: Insufficient documentation

## 2018-01-31 DIAGNOSIS — G4730 Sleep apnea, unspecified: Secondary | ICD-10-CM | POA: Insufficient documentation

## 2018-01-31 DIAGNOSIS — F331 Major depressive disorder, recurrent, moderate: Secondary | ICD-10-CM

## 2018-01-31 DIAGNOSIS — I2782 Chronic pulmonary embolism: Secondary | ICD-10-CM

## 2018-01-31 DIAGNOSIS — E119 Type 2 diabetes mellitus without complications: Secondary | ICD-10-CM | POA: Diagnosis not present

## 2018-01-31 DIAGNOSIS — D259 Leiomyoma of uterus, unspecified: Secondary | ICD-10-CM | POA: Insufficient documentation

## 2018-01-31 DIAGNOSIS — E785 Hyperlipidemia, unspecified: Secondary | ICD-10-CM | POA: Diagnosis not present

## 2018-01-31 HISTORY — PX: HYSTEROSCOPY WITH D & C: SHX1775

## 2018-01-31 LAB — CBC
HCT: 41.2 % (ref 36.0–46.0)
Hemoglobin: 13.5 g/dL (ref 12.0–15.0)
MCH: 30.6 pg (ref 26.0–34.0)
MCHC: 32.8 g/dL (ref 30.0–36.0)
MCV: 93.4 fL (ref 78.0–100.0)
Platelets: 233 10*3/uL (ref 150–400)
RBC: 4.41 MIL/uL (ref 3.87–5.11)
RDW: 13.7 % (ref 11.5–15.5)
WBC: 9.2 10*3/uL (ref 4.0–10.5)

## 2018-01-31 LAB — GLUCOSE, CAPILLARY
Glucose-Capillary: 309 mg/dL — ABNORMAL HIGH (ref 65–99)
Glucose-Capillary: 207 mg/dL — ABNORMAL HIGH (ref 65–99)
Glucose-Capillary: 185 mg/dL — ABNORMAL HIGH (ref 65–99)

## 2018-01-31 LAB — TYPE AND SCREEN
ABO/RH(D): B POS
Antibody Screen: NEGATIVE

## 2018-01-31 LAB — ABO/RH: ABO/RH(D): B POS

## 2018-01-31 SURGERY — DILATATION AND CURETTAGE /HYSTEROSCOPY
Anesthesia: General | Site: Vagina

## 2018-01-31 MED ORDER — ONDANSETRON HCL 4 MG/2ML IJ SOLN
INTRAMUSCULAR | Status: DC | PRN
  Administered 2018-01-31: 4 mg via INTRAVENOUS

## 2018-01-31 MED ORDER — SCOPOLAMINE 1 MG/3DAYS TD PT72
1.0000 | MEDICATED_PATCH | Freq: Once | TRANSDERMAL | Status: DC
  Administered 2018-01-31: 1.5 mg via TRANSDERMAL

## 2018-01-31 MED ORDER — IBUPROFEN 600 MG PO TABS: 600.0000 mg | ORAL_TABLET | Freq: Four times a day (QID) | ORAL | 0 refills | Status: DC | PRN

## 2018-01-31 MED ORDER — FENTANYL CITRATE (PF) 100 MCG/2ML IJ SOLN
25.0000 ug | INTRAMUSCULAR | Status: DC | PRN
  Administered 2018-01-31: 50 ug via INTRAVENOUS

## 2018-01-31 MED ORDER — FENTANYL CITRATE (PF) 100 MCG/2ML IJ SOLN
INTRAMUSCULAR | Status: AC
  Filled 2018-01-31: qty 2

## 2018-01-31 MED ORDER — FENTANYL CITRATE (PF) 100 MCG/2ML IJ SOLN
INTRAMUSCULAR | Status: DC | PRN
  Administered 2018-01-31 (×2): 50 ug via INTRAVENOUS

## 2018-01-31 MED ORDER — PROPOFOL 10 MG/ML IV BOLUS
INTRAVENOUS | Status: DC | PRN
  Administered 2018-01-31: 200 mg via INTRAVENOUS

## 2018-01-31 MED ORDER — LACTATED RINGERS IV SOLN
INTRAVENOUS | Status: DC
  Administered 2018-01-31 (×2): via INTRAVENOUS

## 2018-01-31 MED ORDER — MIDAZOLAM HCL 2 MG/2ML IJ SOLN
INTRAMUSCULAR | Status: AC
  Filled 2018-01-31: qty 2

## 2018-01-31 MED ORDER — MIDAZOLAM HCL 5 MG/5ML IJ SOLN
INTRAMUSCULAR | Status: DC | PRN
  Administered 2018-01-31: 2 mg via INTRAVENOUS

## 2018-01-31 MED ORDER — LIDOCAINE 2% (20 MG/ML) 5 ML SYRINGE
INTRAMUSCULAR | Status: DC | PRN
  Administered 2018-01-31: 40 mg via INTRAVENOUS

## 2018-01-31 MED ORDER — ONDANSETRON HCL 4 MG/2ML IJ SOLN
INTRAMUSCULAR | Status: AC
  Filled 2018-01-31: qty 2

## 2018-01-31 MED ORDER — DIPHENHYDRAMINE HCL 50 MG/ML IJ SOLN
INTRAMUSCULAR | Status: AC
  Filled 2018-01-31: qty 1

## 2018-01-31 MED ORDER — METOPROLOL TARTRATE 25 MG PO TABS
25.0000 mg | ORAL_TABLET | Freq: Once | ORAL | Status: AC
  Administered 2018-01-31: 25 mg via ORAL
  Filled 2018-01-31: qty 1

## 2018-01-31 MED ORDER — ACETAMINOPHEN 500 MG PO TABS
1000.0000 mg | ORAL_TABLET | Freq: Once | ORAL | Status: AC
  Administered 2018-01-31: 1000 mg via ORAL

## 2018-01-31 MED ORDER — KETOROLAC TROMETHAMINE 30 MG/ML IJ SOLN
INTRAMUSCULAR | Status: DC | PRN
  Administered 2018-01-31: 30 mg via INTRAVENOUS

## 2018-01-31 MED ORDER — ONDANSETRON HCL 4 MG/2ML IJ SOLN: 4.0000 mg | Freq: Once | INTRAMUSCULAR | Status: DC | PRN

## 2018-01-31 MED ORDER — ACETAMINOPHEN 500 MG PO TABS
ORAL_TABLET | ORAL | Status: AC
  Filled 2018-01-31: qty 2

## 2018-01-31 MED ORDER — INSULIN REGULAR HUMAN 100 UNIT/ML IJ SOLN
10.0000 [IU] | Freq: Once | INTRAMUSCULAR | Status: AC
  Administered 2018-01-31: 10 [IU] via INTRAVENOUS
  Filled 2018-01-31: qty 0.1

## 2018-01-31 MED ORDER — SCOPOLAMINE 1 MG/3DAYS TD PT72
MEDICATED_PATCH | TRANSDERMAL | Status: AC
  Administered 2018-01-31: 1.5 mg via TRANSDERMAL
  Filled 2018-01-31: qty 1

## 2018-01-31 MED ORDER — FAMOTIDINE 20 MG PO TABS
20.0000 mg | ORAL_TABLET | Freq: Every day | ORAL | Status: DC
  Administered 2018-01-31: 20 mg via ORAL

## 2018-01-31 MED ORDER — SODIUM CHLORIDE 0.9 % IR SOLN
Status: DC | PRN
  Administered 2018-01-31: 3000 mL

## 2018-01-31 MED ORDER — FAMOTIDINE 20 MG PO TABS
ORAL_TABLET | ORAL | Status: AC
  Filled 2018-01-31: qty 1

## 2018-01-31 MED ORDER — DEXAMETHASONE SODIUM PHOSPHATE 10 MG/ML IJ SOLN
INTRAMUSCULAR | Status: AC
  Filled 2018-01-31: qty 1

## 2018-01-31 MED ORDER — DIPHENHYDRAMINE HCL 50 MG/ML IJ SOLN
12.5000 mg | Freq: Once | INTRAMUSCULAR | Status: AC
  Administered 2018-01-31: 12.5 mg via INTRAVENOUS

## 2018-01-31 MED ORDER — PROPOFOL 10 MG/ML IV BOLUS
INTRAVENOUS | Status: AC
  Filled 2018-01-31: qty 20

## 2018-01-31 MED ORDER — KETOROLAC TROMETHAMINE 30 MG/ML IJ SOLN
INTRAMUSCULAR | Status: AC
  Filled 2018-01-31: qty 1

## 2018-01-31 SURGICAL SUPPLY — 18 items
ABLATOR SURESOUND NOVASURE (ABLATOR) IMPLANT
CANISTER SUCT 3000ML PPV (MISCELLANEOUS) ×3 IMPLANT
CATH ROBINSON RED A/P 16FR (CATHETERS) ×3 IMPLANT
ELECT REM PT RETURN 9FT ADLT (ELECTROSURGICAL) ×3
ELECTRODE REM PT RTRN 9FT ADLT (ELECTROSURGICAL) ×1 IMPLANT
GLOVE BIO SURGEON STRL SZ 6.5 (GLOVE) ×2 IMPLANT
GLOVE BIO SURGEONS STRL SZ 6.5 (GLOVE) ×1
GLOVE BIOGEL PI IND STRL 7.0 (GLOVE) ×2 IMPLANT
GLOVE BIOGEL PI INDICATOR 7.0 (GLOVE) ×4
GOWN STRL REUS W/TWL LRG LVL3 (GOWN DISPOSABLE) ×6 IMPLANT
MYOSURE XL FIBROID REM (MISCELLANEOUS) ×3
PACK VAGINAL MINOR WOMEN LF (CUSTOM PROCEDURE TRAY) ×3 IMPLANT
PAD OB MATERNITY 4.3X12.25 (PERSONAL CARE ITEMS) ×3 IMPLANT
SEAL ROD LENS SCOPE MYOSURE (ABLATOR) ×3 IMPLANT
SYSTEM TISS REMOVAL MYSR XL RM (MISCELLANEOUS) ×1 IMPLANT
TOWEL OR 17X24 6PK STRL BLUE (TOWEL DISPOSABLE) ×6 IMPLANT
TUBING AQUILEX INFLOW (TUBING) ×3 IMPLANT
TUBING AQUILEX OUTFLOW (TUBING) ×3 IMPLANT

## 2018-01-31 NOTE — Anesthesia Preprocedure Evaluation (Addendum)
Anesthesia Evaluation  Patient identified by MRN, date of birth, ID band Patient awake    Reviewed: Allergy & Precautions, NPO status , Patient's Chart, lab work & pertinent test results, reviewed documented beta blocker date and time   Airway Mallampati: I  TM Distance: >3 FB Neck ROM: Full    Dental  (+) Teeth Intact, Dental Advisory Given, Missing, Partial Upper,    Pulmonary asthma , sleep apnea , former smoker, PE   Pulmonary exam normal breath sounds clear to auscultation       Cardiovascular hypertension, Pt. on home beta blockers (-) angina+ CAD, + Past MI and + CABG  Normal cardiovascular exam Rhythm:Regular Rate:Normal     Neuro/Psych PSYCHIATRIC DISORDERS Anxiety Depression Bipolar Disorder  Neuromuscular disease CVA, No Residual Symptoms    GI/Hepatic GERD  Medicated and Controlled,(+)     substance abuse (Remote use)  cocaine use,   Endo/Other  diabetes, Poorly Controlled, Type 2, Insulin DependentMorbid obesity  Renal/GU Renal InsufficiencyRenal disease     Musculoskeletal  (+) Arthritis , Osteoarthritis,    Abdominal   Peds  Hematology negative hematology ROS (+)   Anesthesia Other Findings Day of surgery medications reviewed with the patient.  Reproductive/Obstetrics  PMB                            Anesthesia Physical Anesthesia Plan  ASA: III  Anesthesia Plan: General   Post-op Pain Management:    Induction: Intravenous  PONV Risk Score and Plan: 3 and Scopolamine patch - Pre-op, Midazolam and Ondansetron  Airway Management Planned: LMA  Additional Equipment:   Intra-op Plan:   Post-operative Plan: Extubation in OR  Informed Consent: I have reviewed the patients History and Physical, chart, labs and discussed the procedure including the risks, benefits and alternatives for the proposed anesthesia with the patient or authorized representative who has indicated  his/her understanding and acceptance.   Dental advisory given  Plan Discussed with: CRNA  Anesthesia Plan Comments:         Anesthesia Quick Evaluation

## 2018-01-31 NOTE — H&P (Signed)
Chief Complaint  Patient presents with  . Vaginal Bleeding    HPI Tamara Henry is a 57 y.o. female.  [id number] No LMP recorded (lmp unknown). Patient is perimenopausal. She is referred by Dr Nori Riis due to postmenopausal bleeding and an abnormal pelvic US. She had been without vaginal bleeding for almost 1 year when she experienced return of vaginal bleeding.ith irregular heavy menses Notably she had been placed on Xarelto 2053for bilateral pulmonary embolism. She has since been discontinued on Xarelto. Her pelvic ultrasound was performed which showed both intramural and submucosal fibroid.    HPI      Past Medical History:  Diagnosis Date  . Acid reflux   . Allergy    seasonal  . Anxiety   . Arthritis    back  . Asthma   . Back pain   . Bilateral pulmonary embolism ([place]) 05/2016  . Bipolar disorder ([place])   . CAD in native artery    S/P NSTEMI with cath showing severe 3 vessel ASCAD s/p CABGx4 07/28/16.  . Cataracts, bilateral   . CKD (chronic kidney disease), stage III ([facility])    borderline CKD II-III  . Cocaine abuse ([place])    In remission. Stopped using in early 2000's  . Depression   . Diabetes mellitus ([place])    Type II  . GERD (gastroesophageal reflux disease)   . Glaucoma   . High cholesterol   . History of blood transfusion   . History of pulmonary embolus (PE)   . 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 Hx of chest tube placement right   . Hypertension   . Morbid obesity ([place])   . Myocardial infarction ([place])   . Neuropathy    hands and feet  . Pneumonia   . Postoperative anemia 07/2016  . PTSD (post-traumatic stress disorder)   . Sleep apnea    cpap  . Stroke [facility]) 2005   no residual         Past Surgical History:  Procedure Laterality Date  . CARDIAC CATHETERIZATION N/A 07/25/2016   Procedure: Left Heart Cath and Coronary Angiography;  Surgeon: Peter M Martinique, MD;  Location: [facility];  Service: Cardiovascular;   Laterality: N/A;  . COLONOSCOPY    . COLONOSCOPY W/ POLYPECTOMY    . CORONARY ARTERY BYPASS GRAFT N/A 07/28/2016   Procedure: CORONARY ARTERY BYPASS GRAFTING (CABG)x4 using left internal mammary and endoscopic harvest of right greater saphenous vein;  Surgeon: Ivin Poot, MD;  Location: [facility];  Service: Open Heart Surgery;  Laterality: N/A;  . DENTAL SURGERY     to removed broken teeth  . POLYPECTOMY    . PULMONARY EMBOLISM SURGERY    . SHOULDER ARTHROSCOPY WITH ROTATOR CUFF REPAIR Left 02/19/2017   Procedure: LEFT SHOULDER ARTHROSCOPY with debridement andROTATOR CUFF REPAIR;  Surgeon: [name], MD;  Location: [facility];  Service: Orthopedics;  Laterality: Left;  . TEE WITHOUT CARDIOVERSION N/A 07/28/2016   Procedure: TRANSESOPHAGEAL ECHOCARDIOGRAM (TEE);  Surgeon: Ivin Poot, MD;  Location: [facility];  Service: Open Heart Surgery;  Laterality: N/A;  . TUBAL LIGATION           Family History  Problem Relation Age of Onset  . Pancreatitis Mother   . Diabetes type II Mother   . Diabetes Mother   . CAD Father   . Heart attack Father   . Diabetes type II Sister   . Diabetes Sister   . Diabetes type II Brother   .  Diabetes Brother   . Diabetes Brother   . Diabetes Brother   . Clotting disorder Son        heart attack x 3   . Breast cancer Maternal Aunt   . Colon cancer Neg Hx   . Stomach cancer Neg Hx     Social History Social History        Tobacco Use  . Smoking status: Former Smoker    Packs/day: 2.00    Years: 10.00    Pack years: 20.00    Types: Cigarettes    Last attempt to quit: 09/11/2005    Years since quitting: 12.1  . Smokeless tobacco: Never Used  Substance Use Topics  . Alcohol use: No  . Drug use: No         Allergies  Allergen Reactions  . Lactose Intolerance (Gi) Diarrhea  . Lisinopril Other (See Comments) and Cough    Inflammation, coughing  . Reglan [Metoclopramide] Other (See Comments)     REACTION IS SIDE EFFECT Pt stated having lock jaw as a side effect  . Wellbutrin [Bupropion] Nausea And Vomiting and Cough  . Latex Rash  . Metformin And Related Diarrhea  . Penicillins Rash    [FROM PREVIOUS ENTRY-BEFORE 07/27/16] >>"ALL CILLINS"  PATIENT HAD A PCN REACTION WITH IMMEDIATE RASH, FACIAL/TONGUE/THROAT SWELLING, SOB, OR LIGHTHEADEDNESS WITH HYPOTENSION:  #  #  #  YES  #  #  #   Has patient had a PCN reaction causing severe rash involving mucus membranes or skin necrosis: No Has patient had a PCN reaction that required hospitalization No Has patient had a PCN reaction occurring within the last 10 years:   #  #  #  YES  #  #  #   . Sulfa Antibiotics Itching  . Tape Rash          Current Outpatient Medications  Medication Sig Dispense Refill  . amLODipine (NORVASC) 10 MG tablet Take 1 TABLET BY MOUTH daily 28 tablet 3  . aspirin 81 MG EC tablet Take 1 tablet (81 mg total) by mouth daily.    . calcipotriene (DOVONOX) 0.005 % cream Apply 1 application as needed topically.    . Cholecalciferol (VITAMIN D-1000 MAX ST) 1000 units tablet Take 1,000 Units daily by mouth.    . citalopram (CELEXA) 20 MG tablet Take 1 tablet (20 mg total) by mouth daily *Morning* **emergency fill 08/03/2017** 28 tablet 6  . diphenoxylate-atropine (LOMOTIL) 2.5-0.025 MG tablet Take 1 tablet by mouth 4 (four) times daily as needed for diarrhea or loose stools. Take 1-2 tablets up to 4 times daily. 30 tablet 0  . ezetimibe (ZETIA) 10 MG tablet Take 10 mg daily by mouth.  1  . ferrous sulfate 325 (65 FE) MG tablet Take 1 tablet (325 mg total) by mouth daily with breakfast. 30 tablet 3  . folic acid (FOLVITE) 194 MCG tablet Take 400 mcg by mouth daily.    . furosemide (LASIX) 40 MG tablet Take 1 TABLET BY MOUTH daily 28 tablet 3  . gabapentin (NEURONTIN) 300 MG capsule Take 3 capsules BY MOUTH EVERY MORNING, Take 2 capsules BY MOUTH at lunch, and take 3 capsules BY MOUTH at bedtime 224  capsule 2  . glucose blood (ONETOUCH VERIO) test strip Check blood sugar 6 x daily 200 each 3  . guaiFENesin (MUCINEX) 600 MG 12 hr tablet Take 2 tablets (1,200 mg total) 2 (two) times daily by mouth. 14 tablet 0  . hydrochlorothiazide (MICROZIDE)  12.5 MG capsule Take 12.5 mg daily by mouth.    Marland Kitchen HYDROcodone-acetaminophen (NORCO/VICODIN) 5-325 MG tablet Take 1 tablet by mouth 3 (three) times daily as needed for moderate pain. 30 tablet 0  . insulin aspart (NOVOLOG) 100 UNIT/ML injection Inject 5 Units into the skin 2 (two) times daily before a meal. 10 mL 3  . Insulin Glargine (LANTUS SOLOSTAR) 100 UNIT/ML Solostar Pen Inject 50 Units daily at 10 pm into the skin. 5 pen PRN  . Melatonin 3 MG TABS Take 6 mg by mouth at bedtime.    . metoprolol tartrate (LOPRESSOR) 25 MG tablet Take 1 TABLET (25mg ) BY MOUTH TWICE DAILY *Morning&Evening* 56 tablet 2  . Multiple Vitamins-Minerals (MULTIVITAMIN) tablet Take 1 tablet by mouth daily. 30 tablet 5  . omeprazole (PRILOSEC) 40 MG capsule Take 1 capsule BY MOUTH daily 28 capsule 2  . pantoprazole (PROTONIX) 40 MG tablet Take 1 tablet (40 mg total) by mouth 2 (two) times daily before a meal. 90 tablet 3  . potassium chloride SA (K-DUR,KLOR-CON) 20 MEQ tablet take half a TABLET BY MOUTH EVERY MORNING 14 tablet 2  . Probiotic Product (ALIGN) 4 MG CAPS Take 1 capsule (4 mg total) by mouth daily. 30 capsule 2  . tiZANidine (ZANAFLEX) 4 MG tablet Take 1 tablet (4 mg total) by mouth every 6 (six) hours as needed for muscle spasms. 30 tablet 0  . Witch Hazel (PREPARATION H) 50 % PADS Apply 1 application 2 (two) times daily topically. 10 each 3  . atorvastatin (LIPITOR) 80 MG tablet Take 1 tablet (80 mg total) by mouth every evening. 90 tablet 3  . megestrol (MEGACE) 40 MG tablet Take 1 tablet (40 mg total) by mouth 2 (two) times daily. 30 tablet 3            Current Facility-Administered Medications  Medication Dose Route Frequency Provider Last Rate Last Dose   . 0.9 %  sodium chloride infusion  500 mL Intravenous Once Nandigam, Venia Minks, MD        Review of Systems Review of Systems  Constitutional: Negative.   Respiratory: Negative.   Cardiovascular: Negative.   Gastrointestinal: Negative.   Genitourinary: Positive for menstrual problem. Negative for pelvic pain, vaginal bleeding and vaginal discharge.    Blood pressure (!) 160/83, pulse 88, temperature 98.5 F (36.9 C), temperature source Oral, resp. rate 16, height 5\' 4"  (1.626 m), weight 235 lb (106.6 kg), SpO2 96 %.   Physical Exam Physical Exam  Constitutional:  obese  Pulmonary/Chest: Effort normal.  midsternal scar  Abdominal: Soft. She exhibits no mass. There is no tenderness.  obese  Genitourinary: Vagina normal and uterus normal. No vaginal discharge found.  Genitourinary Comments: Pap done, no lesions  Psychiatric: She has a normal mood and affect. Her behavior is normal.  Vitals reviewed.   Data Reviewed CLINICAL DATA: Vaginal bleeding for 2 weeks. Postmenopausal female. Morbid obesity.  EXAM: TRANSABDOMINAL AND TRANSVAGINAL ULTRASOUND OF PELVIS  TECHNIQUE: Both transabdominal and transvaginal ultrasound examinations of the pelvis were performed. Transabdominal technique was performed for global imaging of the pelvis including uterus, ovaries, adnexal regions, and pelvic cul-de-sac. It was necessary to proceed with endovaginal exam following the transabdominal exam to visualize the endometrium and ovaries.  COMPARISON: None  FINDINGS: Technically difficult exam due to patient habitus and inability to tolerate transvaginal sonography well.  Uterus  Measurements: 13.5 x 5.1 x 5.2 cm. Diffusely heterogeneous myometrial echotexture. Probable intramural fibroid in the anterior corpus measuring 2.8 cm.  A 2.6 cm hypoechoic mass is also seen in the lower uterine corpus, which involves the endometrial cavity and is suspicious for an  intracavitary or submucosal fibroid.  Endometrium  Thickness: 17 mm. Incompletely visualized due to probable fibroid in lower uterine segment.  Right ovary  Measurements: Not directly visualized, however no adnexal mass identified.  Left ovary  Measurements: Not directly visualized, however no adnexal mass identified.  Other findings  No abnormal free fluid.  IMPRESSION: Abnormal endometrial thickening measuring 17 mm. In the setting of post-menopausal bleeding, endometrial sampling is indicated to exclude carcinoma.  2.6 cm mass in lower uterine segment, suspicious for an intracavitary or submucosal fibroid, with endometrial polyp considered less likely. Probable small anterior uterine fibroid also noted. Consider pelvic MRI without and with contrast for further evaluation.  Nonvisualization of ovaries, however no adnexal mass identified.   Electronically Signed By: Earle Gell M.D. On: 10/03/2017 16:18  CBG (last 3)  Recent Labs    01/31/18 0751 01/31/18 0856  GLUCAP 309* 207*    Assessment    Postmenopausal bleeding with US showing intracavitary lesion possible fibroid Multiple medical problem     Patient Active Problem List   Diagnosis Date Noted  . Vaginal bleeding 10/09/2017  . Postmenopausal vaginal bleeding 10/04/2017  . Cough 10/04/2017  . Diarrhea 08/04/2017  . Arm pain, anterior, right 06/17/2017  . OSA on CPAP 05/03/2017  . Morbid obesity due to excess calories ([place]) 05/03/2017  . Microscopic hematuria 03/27/2017  . Impingement syndrome of left shoulder 02/19/2017  . Hypersomnia 12/12/2016  . REM behavioral disorder 12/12/2016  . Asthma 12/12/2016  . Neck pain 11/17/2016  . CKD (chronic kidney disease), stage III ([place]) 11/02/2016  . CAD (coronary artery disease), native coronary artery 10/26/2016  . Poor social situation 10/13/2016  . Leg pain 08/30/2016  . Bilateral low back pain without sciatica 08/30/2016  . S/P  CABG x 4 07/28/2016  . Bilateral pulmonary embolism ([place]) 05/17/2016  . Hyperlipidemia 05/17/2016  . Depression 05/17/2016  . Diabetic peripheral neuropathy ([place]) 05/17/2016  . Neuritis of upper extremity, C7 01/15/2015  . Diabetes ([place]) 01/14/2015  . Hypertension 01/14/2015  . Numbness and tingling of right arm 01/14/2015     Plan    Rather than performing an endometrial biopsy I recommend diagnostic and possible operative hysteroscopy and D&C to be done in main OR after medical clearance and anesthesia consult.She has been seen by her PCP and is cleared for surgery and anesthesia approves.  The risks of surgery were discussed in detail with the patient including but not limited to: bleeding which may require transfusion or reoperation; infection which may require prolonged hospitalization or re-hospitalization and antibiotic therapy; injury to bowel, bladder, ureters and major vessels or other surrounding organs; need for additional procedures including laparotomy; thromboembolic phenomenon, incisional problems and other postoperative or anesthesia complications.  Patient was told that the likelihood that her condition and symptoms will be treated effectively with this surgical management was very high; the postoperative expectations were also discussed in detail. The patient also understands the alternative treatment options which were discussed in full. All questions were answered.    Woodroe Mode, MD 01/31/2018

## 2018-01-31 NOTE — Anesthesia Postprocedure Evaluation (Signed)
Anesthesia Post Note  Patient: Tamara Henry  Procedure(s) Performed: DILATATION AND CURETTAGE /HYSTEROSCOPY (N/A Vagina )     Patient location during evaluation: PACU Anesthesia Type: General Level of consciousness: awake and alert Pain management: pain level controlled Vital Signs Assessment: post-procedure vital signs reviewed and stable Respiratory status: spontaneous breathing, nonlabored ventilation and respiratory function stable Cardiovascular status: blood pressure returned to baseline and stable Postop Assessment: no apparent nausea or vomiting Anesthetic complications: no    Last Vitals:  Vitals:   01/31/18 1230 01/31/18 1357  BP: (!) 158/77 (!) 160/67  Pulse: 73 72  Resp: 13 16  Temp: 37.1 C 36.7 C  SpO2: 97% 99%    Last Pain:  Vitals:   01/31/18 1357  TempSrc:   PainSc: 0-No pain   Pain Goal: Patients Stated Pain Goal: 5 (01/31/18 1208)               Catalina Gravel

## 2018-01-31 NOTE — Discharge Instructions (Addendum)
Hysteroscopy, Care After Refer to this sheet in the next few weeks. These instructions provide you with information on caring for yourself after your procedure. Your health care provider may also give you more specific instructions. Your treatment has been planned according to current medical practices, but problems sometimes occur. Call your health care provider if you have any problems or questions after your procedure. What can I expect after the procedure? After your procedure, it is typical to have the following:  You may have some cramping. This normally lasts for a couple days.  You may have bleeding. This can vary from light spotting for a few days to menstrual-like bleeding for 3-7 days.  Follow these instructions at home:  Rest for the first 1-2 days after the procedure.  Only take over-the-counter or prescription medicines as directed by your health care provider. Do not take aspirin. It can increase the chances of bleeding.  Take showers instead of baths for 2 weeks or as directed by your health care provider.  Do not drive for 24 hours or as directed.  Do not drink alcohol while taking pain medicine.  Do not use tampons, douche, or have sexual intercourse for 2 weeks or until your health care provider says it is okay.  Take your temperature twice a day for 4-5 days. Write it down each time.  Follow your health care provider's advice about diet, exercise, and lifting.  If you develop constipation, you may: ? Take a mild laxative if your health care provider approves. ? Add bran foods to your diet. ? Drink enough fluids to keep your urine clear or pale yellow.  Try to have someone with you or available to you for the first 24-48 hours, especially if you were given a general anesthetic.  Follow up with your health care provider as directed. Contact a health care provider if:  You feel dizzy or lightheaded.  You feel sick to your stomach (nauseous).  You have  abnormal vaginal discharge.  You have a rash.  You have pain that is not controlled with medicine. Get help right away if:  You have bleeding that is heavier than a normal menstrual period.  You have a fever.  You have increasing cramps or pain, not controlled with medicine.  You have new belly (abdominal) pain.  You pass out.  You have pain in the tops of your shoulders (shoulder strap areas).  You have shortness of breath. This information is not intended to replace advice given to you by your health care provider. Make sure you discuss any questions you have with your health care provider. Document Released: 06/18/2013 Document Revised: 02/03/2016 Document Reviewed: 03/27/2013 Elsevier Interactive Patient Education  2017 [place] Instructions  No ibuprofen products until 5:15 today  Activity: Get plenty of rest for the remainder of the day. A responsible individual must stay with you for 24 hours following the procedure.  For the next 24 hours, DO NOT: -Drive a car -Paediatric nurse -Drink alcoholic beverages -Take any medication unless instructed by your physician -Make any legal decisions or sign important papers.  Meals: Start with liquid foods such as gelatin or soup. Progress to regular foods as tolerated. Avoid greasy, spicy, heavy foods. If nausea and/or vomiting occur, drink only clear liquids until the nausea and/or vomiting subsides. Call your physician if vomiting continues.  Special Instructions/Symptoms: Your throat may feel dry or sore from the anesthesia or the breathing tube placed in your throat  during surgery. If this causes discomfort, gargle with warm salt water. The discomfort should disappear within 24 hours.  If you had a scopolamine patch placed behind your ear for the management of post- operative nausea and/or vomiting:  1. The medication in the patch is effective for 72 hours, after which it should be  removed.  Wrap patch in a tissue and discard in the trash. Wash hands thoroughly with soap and water. 2. You may remove the patch earlier than 72 hours if you experience unpleasant side effects which may include dry mouth, dizziness or visual disturbances. 3. Avoid touching the patch. Wash your hands with soap and water after contact with the patch.

## 2018-01-31 NOTE — Op Note (Signed)
PREOPERATIVE DIAGNOSIS:  Abnormal uterine bleeding, postmenopausal with intracavitary lesion seen on ultrasound POSTOPERATIVE DIAGNOSIS: Endometrial polyp PROCEDURE: Hysteroscopy, Dilation and Curettage resection of intracavitary mass with MyoSure SURGEON:  Dr. Emeterio Reeve   INDICATIONS: 57 y.o. [id number]  here for scheduled surgery for the aforementioned diagnoses.   Risks of surgery were discussed with the patient including but not limited to: bleeding which may require transfusion; infection which may require antibiotics; injury to uterus or surrounding organs; intrauterine scarring which may impair future fertility; need for additional procedures including laparotomy or laparoscopy; and other postoperative/anesthesia complications. Written informed consent was obtained.    FINDINGS:  A 6 week size uterus.  Normal endometrium with large endometrial polyp.  Normal ostia bilaterally.  ANESTHESIA:   General INTRAVENOUS FLUIDS:  1000 ml of LR FLUID DEFICITS:  274ml of saliine ESTIMATED BLOOD LOSS:  Less than 20 ml SPECIMENS: Endometrial curettings and resected polyp sent to pathology COMPLICATIONS:  None immediate.  PROCEDURE DETAILS:  The patient was then taken to the operating room where general anesthesia was administered and was found to be adequate.  After an adequate timeout was performed, she was placed in the dorsal lithotomy position and examined; then prepped and draped in the sterile manner.   Her bladder was catheterized for an unmeasured amount of clear, yellow urine. A speculum was then placed in the patient's vagina and a single tooth tenaculum was applied to the anterior lip of the cervix. The cervix was sounded to 9 cm and dilated manually with metal dilators to accommodate the 5 mm diagnostic hysteroscope.  Once the cervix was dilated, the hysteroscope was inserted under direct visualization using saline as a suspension medium.  The uterine cavity was carefully examined with the  findings as noted above.   After further careful visualization of the uterine cavity, the hysteroscope was removed under direct visualization. The MyoSure was then used to resect the polyp and complete removal was achieved.  A sharp curettage was then performed to obtain a small amount of endometrial curettings.  The tenaculum was removed from the anterior lip of the cervix and the vaginal speculum was removed after noting good hemostasis.  The patient tolerated the procedure well and was taken to the recovery area awake, extubated and in stable condition.  The patient will be discharged to home as per PACU criteria.  Routine postoperative instructions given.  She was prescribed Percocet, Ibuprofen.  She will follow up in the clinic in 2 weeks  for postoperative evaluation.  Woodroe Mode, MD 01/31/2018 11:39 AM

## 2018-01-31 NOTE — Anesthesia Procedure Notes (Signed)
Procedure Name: LMA Insertion Date/Time: 01/31/2018 10:53 AM Performed by: Talbot Grumbling, CRNA Pre-anesthesia Checklist: Patient identified, Emergency Drugs available, Suction available and Patient being monitored Patient Re-evaluated:Patient Re-evaluated prior to induction Oxygen Delivery Method: Circle system utilized Preoxygenation: Pre-oxygenation with 100% oxygen Induction Type: IV induction Ventilation: Mask ventilation without difficulty LMA: LMA inserted LMA Size: 4.0 Number of attempts: 1 Placement Confirmation: positive ETCO2 and breath sounds checked- equal and bilateral Tube secured with: Tape Dental Injury: Teeth and Oropharynx as per pre-operative assessment

## 2018-01-31 NOTE — Transfer of Care (Signed)
Immediate Anesthesia Transfer of Care Note  Patient: Tamara Henry  Procedure(s) Performed: DILATATION AND CURETTAGE /HYSTEROSCOPY (N/A Vagina )  Patient Location: PACU  Anesthesia Type:General  Level of Consciousness: awake, alert  and oriented  Airway & Oxygen Therapy: Patient Spontanous Breathing and Patient connected to nasal cannula oxygen  Post-op Assessment: Report given to RN and Post -op Vital signs reviewed and stable  Post vital signs: Reviewed and stable  Last Vitals:  Vitals Value Taken Time  BP    Temp    Pulse 81 01/31/2018 11:31 AM  Resp    SpO2 100 % 01/31/2018 11:31 AM  Vitals shown include unvalidated device data.  Last Pain:  Vitals:   01/31/18 0905  TempSrc: Oral      Patients Stated Pain Goal: 5 (44/51/46 0479)  Complications: No apparent anesthesia complications

## 2018-02-01 ENCOUNTER — Encounter (HOSPITAL_COMMUNITY): Payer: Self-pay | Admitting: Obstetrics & Gynecology

## 2018-02-05 DIAGNOSIS — E113513 Type 2 diabetes mellitus with proliferative diabetic retinopathy with macular edema, bilateral: Secondary | ICD-10-CM | POA: Diagnosis not present

## 2018-02-06 ENCOUNTER — Other Ambulatory Visit: Payer: Self-pay | Admitting: Family Medicine

## 2018-02-06 ENCOUNTER — Telehealth: Payer: Self-pay | Admitting: Licensed Clinical Social Worker

## 2018-02-06 MED ORDER — OLMESARTAN MEDOXOMIL 20 MG PO TABS: 20.0000 mg | ORAL_TABLET | Freq: Every day | ORAL | Status: DC

## 2018-02-06 NOTE — Telephone Encounter (Signed)
Pt returned call to [employer]. Call back [phone] Wallace Cullens, RN

## 2018-02-06 NOTE — Progress Notes (Signed)
Type of Service: Clinical Social Work  LCSW return phone call from patient. Left message returning call and to call LCSW if needed. LCSW received return call phone call from patient, states she needs assistance with housing resources.  Patient appreciative of information and assistance.    Intervention: emotional support, Reflective listening, [employer] for housing provided,  Plan: Patient will call back if needed.  Casimer Lanius, LCSW Licensed Clinical Social Worker [place]   [phone] 9:14 AM

## 2018-02-08 ENCOUNTER — Telehealth: Payer: Self-pay

## 2018-02-08 NOTE — Telephone Encounter (Signed)
Pt called nurse line, states she is staying in [place] for the next 30 days, she was evicted from her home. States she is trying to get her "2 insulins" [facility] days per [facility] guide. Pt not exactly clear which 2 insulins she is to be taking.  Pt's call back number [phone] Wallace Cullens, RN

## 2018-02-11 ENCOUNTER — Ambulatory Visit: Payer: Medicare Other

## 2018-02-11 ENCOUNTER — Other Ambulatory Visit: Payer: Self-pay

## 2018-02-11 ENCOUNTER — Other Ambulatory Visit: Payer: Self-pay | Admitting: Family Medicine

## 2018-02-11 MED ORDER — INSULIN GLARGINE 100 UNIT/ML SOLOSTAR PEN: 50.0000 [IU] | PEN_INJECTOR | Freq: Every day | SUBCUTANEOUS | 99 refills | Status: DC

## 2018-02-11 MED ORDER — INSULIN LISPRO 100 UNIT/ML (KWIKPEN): 5.0000 [IU] | PEN_INJECTOR | Freq: Two times a day (BID) | SUBCUTANEOUS | 1 refills | Status: DC

## 2018-02-11 NOTE — Telephone Encounter (Signed)
Insulins sent to [facility]

## 2018-02-11 NOTE — Telephone Encounter (Signed)
[facility], they [facility] in [place]

## 2018-02-12 ENCOUNTER — Telehealth: Payer: Self-pay

## 2018-02-12 NOTE — Telephone Encounter (Signed)
Pt called nurse line stating that we sent her insulin rx- that she got the pens but not her vial of insulin. Called patient back and advised that the [employer] has the insulin in it- that she doesn't need a separate vial of insulin. Pt insists that for the past several years "whichever insulin she takes 5u of" comes in a vial. Pt very frustrated that I was "not understanding her" and requests message be sent to MD. Will also forward to Dr. Valentina Lucks for review. Pt call back [phone] Wallace Cullens, RN

## 2018-02-12 NOTE — Telephone Encounter (Signed)
Patient left two different voicemails expressing concerns about her insulin. Apparently one is supposed to be a vial vs a pen.  Please call to discuss with her.  [place], RN [facility] RN)

## 2018-02-13 NOTE — Telephone Encounter (Signed)
Called patient. No answer. Left voicemail. Medicare has changed which insulins they cover, they are covering Humalog and not Novolog. Rx sent for Humalog to patient' pharmacy.   Smitty Cords, MD [place], PGY-3

## 2018-02-14 ENCOUNTER — Ambulatory Visit: Payer: Self-pay

## 2018-02-18 NOTE — Telephone Encounter (Signed)
LVM to inform pt of below and to see about scheduling her with Dr. Valentina Lucks to discuss her medicines. Katharina Caper, Jahyra Sukup D, [place]

## 2018-02-19 ENCOUNTER — Telehealth: Payer: Self-pay | Admitting: *Deleted

## 2018-02-19 ENCOUNTER — Other Ambulatory Visit: Payer: Self-pay

## 2018-02-19 NOTE — Patient Outreach (Signed)
[place] [facility]) Care Management  02/19/2018  Tamara Henry 1961-03-23 [id number]   Medication Adherence call to Mrs. Tamara Henry left a message for patient to call back patient is due on Atorvastatin 80 mg. Patient is showing past due under [place].    [place] Management Direct Dial [phone]  Fax [phone] [email or web address]

## 2018-02-19 NOTE — Telephone Encounter (Signed)
Patient wants to let Neoma Laming know that she is now homeless.  She would like her to call her when she returns tomorrow. [place], [name], CMA

## 2018-02-20 NOTE — Telephone Encounter (Signed)
Type of Service: Clinical Social Work  LCSW received message to call patient ref. Being homeless.  LCSW returned phone call.  Left message to call LCSW.  Plan: LCSW will wait for return call.  Casimer Lanius, LCSW Licensed Clinical Social Worker [place]   [phone] 9:30 AM

## 2018-02-21 ENCOUNTER — Telehealth: Payer: Self-pay

## 2018-02-21 NOTE — Telephone Encounter (Signed)
Pt calling Dr. Juanito Doom for results and to make sure that her living in [place] would not be a problem for her to be a patient here. I told pt that we will see her as long as she can get here and I would have Dr. Juanito Doom call her with test results. Ottis Stain, CMA

## 2018-02-25 NOTE — Telephone Encounter (Signed)
LMOVM asking to to return call. Medardo Hassing, [name], CMA

## 2018-02-26 ENCOUNTER — Telehealth: Payer: Self-pay | Admitting: *Deleted

## 2018-02-26 NOTE — Telephone Encounter (Signed)
[facility]  Tamara Henry, [name], [name]

## 2018-02-27 NOTE — Telephone Encounter (Signed)
White team please have patient call the doctor's office who did the procedure as we do not have the results here.  Thank you  Smitty Cords, MD [place], PGY-3

## 2018-03-01 DIAGNOSIS — E119 Type 2 diabetes mellitus without complications: Secondary | ICD-10-CM | POA: Diagnosis not present

## 2018-03-01 DIAGNOSIS — K219 Gastro-esophageal reflux disease without esophagitis: Secondary | ICD-10-CM | POA: Diagnosis not present

## 2018-03-01 DIAGNOSIS — R05 Cough: Secondary | ICD-10-CM | POA: Diagnosis not present

## 2018-03-05 NOTE — Telephone Encounter (Signed)
Called pt. Left general voice message on VM to get a return call to the office. If pt calls, please give her the information below. Ottis Stain, CMA

## 2018-03-08 ENCOUNTER — Other Ambulatory Visit: Payer: Self-pay

## 2018-03-08 NOTE — Patient Outreach (Signed)
[facility]) Care Management  03/08/2018  Braeden Dolinski 04-20-1961 [id number]   Medication Adherence call to Mrs. [name] patient said she is moving and does not know what pharmacy will be close to her, she is going to wait until she get situated, she will call me back once she finds out where she is going to be staying that way I can help her call in her medications patient is due on Atorvastatin 80 mg Under [place].    [place] Management Direct Dial [phone]  Fax [phone] [email or web address]

## 2018-03-11 ENCOUNTER — Encounter (HOSPITAL_BASED_OUTPATIENT_CLINIC_OR_DEPARTMENT_OTHER): Payer: Self-pay | Admitting: *Deleted

## 2018-03-11 ENCOUNTER — Emergency Department (HOSPITAL_BASED_OUTPATIENT_CLINIC_OR_DEPARTMENT_OTHER): Payer: Medicare Other

## 2018-03-11 ENCOUNTER — Other Ambulatory Visit: Payer: Self-pay

## 2018-03-11 ENCOUNTER — Emergency Department (HOSPITAL_BASED_OUTPATIENT_CLINIC_OR_DEPARTMENT_OTHER)
Admission: EM | Admit: 2018-03-11 | Discharge: 2018-03-11 | Disposition: A | Discharge status: Home / Self Care | Payer: Medicare Other | Attending: Emergency Medicine | Admitting: Emergency Medicine

## 2018-03-11 DIAGNOSIS — M7989 Other specified soft tissue disorders: Secondary | ICD-10-CM | POA: Diagnosis not present

## 2018-03-11 DIAGNOSIS — Z87891 Personal history of nicotine dependence: Secondary | ICD-10-CM | POA: Insufficient documentation

## 2018-03-11 DIAGNOSIS — Z794 Long term (current) use of insulin: Secondary | ICD-10-CM | POA: Diagnosis not present

## 2018-03-11 DIAGNOSIS — I129 Hypertensive chronic kidney disease with stage 1 through stage 4 chronic kidney disease, or unspecified chronic kidney disease: Secondary | ICD-10-CM | POA: Insufficient documentation

## 2018-03-11 DIAGNOSIS — I2510 Atherosclerotic heart disease of native coronary artery without angina pectoris: Secondary | ICD-10-CM | POA: Insufficient documentation

## 2018-03-11 DIAGNOSIS — N183 Chronic kidney disease, stage 3 (moderate): Secondary | ICD-10-CM | POA: Diagnosis not present

## 2018-03-11 DIAGNOSIS — S93402A Sprain of unspecified ligament of left ankle, initial encounter: Secondary | ICD-10-CM

## 2018-03-11 DIAGNOSIS — M25572 Pain in left ankle and joints of left foot: Secondary | ICD-10-CM | POA: Diagnosis not present

## 2018-03-11 DIAGNOSIS — Y999 Unspecified external cause status: Secondary | ICD-10-CM | POA: Diagnosis not present

## 2018-03-11 DIAGNOSIS — Z951 Presence of aortocoronary bypass graft: Secondary | ICD-10-CM | POA: Insufficient documentation

## 2018-03-11 DIAGNOSIS — Y929 Unspecified place or not applicable: Secondary | ICD-10-CM | POA: Insufficient documentation

## 2018-03-11 DIAGNOSIS — E1122 Type 2 diabetes mellitus with diabetic chronic kidney disease: Secondary | ICD-10-CM | POA: Insufficient documentation

## 2018-03-11 DIAGNOSIS — J45909 Unspecified asthma, uncomplicated: Secondary | ICD-10-CM | POA: Diagnosis not present

## 2018-03-11 DIAGNOSIS — Z79899 Other long term (current) drug therapy: Secondary | ICD-10-CM | POA: Insufficient documentation

## 2018-03-11 DIAGNOSIS — S99912A Unspecified injury of left ankle, initial encounter: Secondary | ICD-10-CM | POA: Diagnosis not present

## 2018-03-11 DIAGNOSIS — Y9389 Activity, other specified: Secondary | ICD-10-CM | POA: Insufficient documentation

## 2018-03-11 DIAGNOSIS — Z91040 Latex allergy status: Secondary | ICD-10-CM | POA: Diagnosis not present

## 2018-03-11 DIAGNOSIS — X509XXA Other and unspecified overexertion or strenuous movements or postures, initial encounter: Secondary | ICD-10-CM | POA: Diagnosis not present

## 2018-03-11 MED ORDER — ACETAMINOPHEN 500 MG PO TABS
1000.0000 mg | ORAL_TABLET | Freq: Once | ORAL | Status: AC
  Administered 2018-03-11: 1000 mg via ORAL
  Filled 2018-03-11: qty 2

## 2018-03-11 NOTE — ED Provider Notes (Signed)
[facility] EMERGENCY DEPARTMENT Provider Note   CSN: [id number] Arrival date & time: 03/11/18  1846     History   Chief Complaint Chief Complaint  Patient presents with  . Ankle Injury    HPI Tamara Henry is a 57 y.o. female.  Tamara Henry is a 57 y.o. Female with a history of hypertension, hyperlipidemia, stroke, diabetes, CAD, CKD, who presents for evaluation of left ankle pain and swelling.  She reports 3 days ago she was coming down the steps when she twisted her ankle since then she has had worsening pain and swelling, she has been able to bear weight but it is very painful.  Able to wiggle her toes without difficulty.  Patient reports pain is constant and throbbing, worse with movement or weightbearing.  She has tried Tylenol for pain has not tried anything else.  Denies previous injury or surgery to the ankle.  No numbness, weakness or tingling.  No pain at the knee.     Past Medical History:  Diagnosis Date  . Acid reflux   . Allergy    seasonal  . Anxiety   . Arthritis    lower back, feet  . Asthma    albuterol inhaler - rarely uses - only needs when she gets sick  . Back pain   . Bilateral pulmonary embolism ([place]) 05/2016  . Bipolar disorder ([place])   . CAD in native artery    S/P NSTEMI with cath showing severe 3 vessel ASCAD s/p CABGx4 07/28/16.  . Cataracts, bilateral   . CKD (chronic kidney disease), stage III (HCC)    borderline CKD II-III  . Cocaine abuse ([place])    In remission. Stopped using in early 2000's  . Depression   . Diabetes mellitus ([place])    Type II  . GERD (gastroesophageal reflux disease)   . Glaucoma   . High cholesterol   . History of blood transfusion 2017  . History of pulmonary embolus (PE)   . 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 Hx of chest tube placement right   . Hypertension   . Morbid obesity ([place])   . Myocardial infarction ([place]) 2017  . Neuromuscular disorder (HCC)    neuropathy in hands and feet  . Neuropathy    hands and  feet  . Pneumonia   . Postoperative anemia 07/2016  . PTSD (post-traumatic stress disorder)   . Sleep apnea    Does not use cpap  . Stroke [facility]) 2005   no residual  . SVD (spontaneous vaginal delivery)    x 4    Patient Active Problem List   Diagnosis Date Noted  . Pain in lower jaw 01/16/2018  . Vaginal bleeding 10/09/2017  . Postmenopausal vaginal bleeding 10/04/2017  . Diarrhea 08/04/2017  . Arm pain, anterior, right 06/17/2017  . OSA on CPAP 05/03/2017  . Morbid obesity due to excess calories ([place]) 05/03/2017  . Microscopic hematuria 03/27/2017  . Impingement syndrome of left shoulder 02/19/2017  . Hypersomnia 12/12/2016  . REM behavioral disorder 12/12/2016  . Asthma 12/12/2016  . Neck pain 11/17/2016  . CKD (chronic kidney disease), stage III ([place]) 11/02/2016  . CAD (coronary artery disease), native coronary artery 10/26/2016  . Poor social situation 10/13/2016  . Leg pain 08/30/2016  . Back pain 08/30/2016  . 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 Hyperlipidemia 05/17/2016  . Depression 05/17/2016  . Diabetic peripheral neuropathy ([place]) 05/17/2016  . Neuritis of upper extremity, C7 01/15/2015  . Diabetes ([place])  01/14/2015  . Hypertension 01/14/2015  . Numbness and tingling of right arm 01/14/2015    Past Surgical History:  Procedure Laterality Date  . CARDIAC CATHETERIZATION N/A 07/25/2016   Procedure: Left Heart Cath and Coronary Angiography;  Surgeon: Peter M Martinique, MD;  Location: [facility] CV LAB;  Service: Cardiovascular;  Laterality: N/A;  . COLONOSCOPY    . COLONOSCOPY W/ POLYPECTOMY    . CORONARY ARTERY BYPASS GRAFT N/A 07/28/2016   Procedure: CORONARY ARTERY BYPASS GRAFTING (CABG)x4 using left internal mammary and endoscopic harvest of right greater saphenous vein;  Surgeon: Ivin Poot, MD;  Location: [facility];  Service: Open Heart Surgery;  Laterality: N/A;  . DENTAL SURGERY     to removed broken teeth  . HYSTEROSCOPY W/D&C N/A 01/31/2018   Procedure:  DILATATION AND CURETTAGE /HYSTEROSCOPY;  Surgeon: Woodroe Mode, MD;  Location: [facility];  Service: Gynecology;  Laterality: N/A;  . POLYPECTOMY    . PULMONARY EMBOLISM SURGERY  2017   [facility]  . SHOULDER ARTHROSCOPY WITH ROTATOR CUFF REPAIR Left 02/19/2017   Procedure: LEFT SHOULDER ARTHROSCOPY with debridement andROTATOR CUFF REPAIR;  Surgeon: [name], MD;  Location: [facility];  Service: Orthopedics;  Laterality: Left;  . TEE WITHOUT CARDIOVERSION N/A 07/28/2016   Procedure: TRANSESOPHAGEAL ECHOCARDIOGRAM (TEE);  Surgeon: Ivin Poot, MD;  Location: [facility] [place];  Service: Open Heart Surgery;  Laterality: N/A;  . TONSILLECTOMY    . TUBAL LIGATION       OB History    Gravida  5   Para  4   Term  4   Preterm      AB  1   Living  4     SAB      TAB  1   Ectopic      Multiple      Live Births               Home Medications    Prior to Admission medications   Medication Sig Start Date End Date Taking? Authorizing Provider  acetaminophen (TYLENOL) 650 MG CR tablet Take 1 tablet (650 mg total) by mouth every 8 (eight) hours as needed for pain. 01/30/18   Carlyle Dolly, MD  albuterol (PROVENTIL HFA;VENTOLIN HFA) 108 (90 Base) MCG/ACT inhaler Inhale 1-2 puffs into the lungs every 6 (six) hours as needed for wheezing or shortness of breath.    [provider]  amLODipine (NORVASC) 10 MG tablet Take 1 tablet (10 mg total) by mouth daily. 12/26/17   Carlyle Dolly, MD  aspirin 81 MG EC tablet Take 1 tablet (81 mg total) by mouth daily. Patient not taking: Reported on 01/24/2018 10/26/16   Sueanne Margarita, MD  atorvastatin (LIPITOR) 80 MG tablet Take 1 tablet (80 mg total) by mouth every evening. 12/06/17   Lyda Jester M, PA-C  b complex vitamins tablet Take 1 tablet by mouth daily.    [provider]  calcipotriene (DOVONOX) 0.005 % cream Apply 1 application topically as needed (itching).  07/26/17   [provider]  citalopram  (CELEXA) 20 MG tablet Take 1 tablet (20 mg total) by mouth daily *Morning* **emergency fill 08/03/2017** 09/24/17   Carlyle Dolly, MD  clindamycin (CLEOCIN) 300 MG capsule Take 1 capsule (300 mg total) by mouth 3 (three) times daily. 01/16/18   Carlyle Dolly, MD  diphenoxylate-atropine (LOMOTIL) 2.5-0.025 MG tablet Take 1 tablet by mouth 4 (four) times daily as needed for diarrhea or loose stools.  Take 1-2 tablets up to 4 times daily. 08/15/17   Levin Erp, PA  ezetimibe (ZETIA) 10 MG tablet Take 1 tablet (10 mg total) by mouth daily. 12/26/17   Consuelo Pandy, PA-C  ferrous sulfate 325 (65 FE) MG tablet Take 1 tablet (325 mg total) by mouth daily with breakfast. Patient taking differently: Take 325 mg by mouth 2 (two) times daily with a meal.  01/18/17   Gambino, Arlie Solomons, MD  Flaxseed, Linseed, (FLAX SEED OIL) 1000 MG CAPS Take 1,000 mg by mouth daily.    [provider]  folic acid (FOLVITE) 419 MCG tablet Take 400 mcg by mouth daily.    [provider]  furosemide (LASIX) 40 MG tablet Take 1 tablet (40 mg total) by mouth daily. 12/26/17   Carlyle Dolly, MD  gabapentin (NEURONTIN) 300 MG capsule Take 3 capsules EVERY MORNING, Take 2 capsules at lunch, and take 3 capsules at bedtime Patient taking differently: Take 600-900 mg by mouth See admin instructions. Take 900 mg by mouth in the morning, take 600 mg by mouth at noon and take 900 mg by mouth at bedtime 12/26/17   Carlyle Dolly, MD  glucose blood (ONETOUCH VERIO) test strip Check blood sugar 6 x daily Patient not taking: Reported on 01/24/2018 12/26/17   Carlyle Dolly, MD  guaiFENesin (MUCINEX) 600 MG 12 hr tablet Take 2 tablets (1,200 mg total) 2 (two) times daily by mouth. Patient taking differently: Take 1,200 mg by mouth 2 (two) times daily as needed for cough or to loosen phlegm.  07/28/17   Langston Masker B, PA-C  ibuprofen (ADVIL,MOTRIN) 600 MG tablet Take 1 tablet (600 mg  total) by mouth every 6 (six) hours as needed. 01/31/18   Woodroe Mode, MD  Insulin Glargine (LANTUS SOLOSTAR) 100 UNIT/ML Solostar Pen Inject 50 Units into the skin daily at 10 pm. 02/11/18   Carlyle Dolly, MD  insulin lispro (HUMALOG KWIKPEN) 100 UNIT/ML KiwkPen Inject 0.05 mLs (5 Units total) into the skin 2 (two) times daily with a meal. 02/11/18   Gambino, Arlie Solomons, MD  loperamide (IMODIUM A-D) 2 MG tablet Take 4 mg by mouth as needed for diarrhea or loose stools.    [provider]  Melatonin 5 MG CAPS Take 5 mg by mouth at bedtime.    [provider]  metoprolol tartrate (LOPRESSOR) 25 MG tablet Take 1 TABLET (25mg ) BY MOUTH TWICE DAILY *Morning&Evening* Patient taking differently: Take 25 mg by mouth 2 (two) times daily.  12/26/17   Carlyle Dolly, MD  mometasone (NASONEX) 50 MCG/ACT nasal spray Place 2 sprays into the nose daily as needed for congestion. 09/28/17   [provider]  Multiple Vitamins-Minerals (MULTIVITAMIN) tablet Take 1 tablet by mouth daily. 01/18/17   Carlyle Dolly, MD  olmesartan (BENICAR) 20 MG tablet Take 1 tablet (20 mg total) by mouth daily. 02/06/18   Carlyle Dolly, MD  omeprazole (PRILOSEC) 40 MG capsule Take 1 capsule (40 mg total) by mouth daily. 12/26/17   Carlyle Dolly, MD  pantoprazole (PROTONIX) 40 MG tablet Take 1 tablet (40 mg total) by mouth 2 (two) times daily before a meal. 08/15/17   Lemmon, Lavone Nian, PA  Polyvinyl Alcohol (LUBRICANT DROPS OP) Place 1 drop into both eyes daily as needed (dry eyes).    [provider]  potassium chloride SA (K-DUR,KLOR-CON) 20 MEQ tablet take half a TABLET BY MOUTH EVERY MORNING Patient taking differently: Take 10  mEq by mouth daily.  12/26/17   Carlyle Dolly, MD  Probiotic Product (ALIGN) 4 MG CAPS Take 1 capsule (4 mg total) by mouth daily. Patient not taking: Reported on 01/24/2018 01/18/17   Carlyle Dolly, MD  tiZANidine (ZANAFLEX) 4 MG  tablet Take 1 tablet (4 mg total) by mouth every 6 (six) hours as needed for muscle spasms. 01/16/18   Carlyle Dolly, MD  vitamin C (ASCORBIC ACID) 500 MG tablet Take 500 mg by mouth daily.    [provider]  Verlee Monte (PREPARATION H) 50 % PADS Apply 1 application 2 (two) times daily topically. Patient taking differently: Apply 1 application topically 2 (two) times daily as needed (itching).  07/30/17   Carlyle Dolly, MD    Family History Family History  Problem Relation Age of Onset  . Pancreatitis Mother   . Diabetes type II Mother   . Diabetes Mother   . CAD Father   . Heart attack Father   . Diabetes type II Sister   . Diabetes Sister   . Diabetes type II Brother   . Diabetes Brother   . Diabetes Brother   . Diabetes Brother   . Clotting disorder Son        heart attack x 3   . Breast cancer Maternal Aunt   . Colon cancer Neg Hx   . Stomach cancer Neg Hx     Social History Social History   Tobacco Use  . Smoking status: Former Smoker    Packs/day: 2.00    Years: 10.00    Pack years: 20.00    Types: Cigarettes    Last attempt to quit: 09/11/2005    Years since quitting: 12.5  . Smokeless tobacco: Never Used  Substance Use Topics  . Alcohol use: No  . Drug use: Not Currently    Types: Cocaine    Comment: History Cocaine - last use in 2000's     Allergies   Lactose intolerance (gi); Lisinopril; Reglan [metoclopramide]; Wellbutrin [bupropion]; Other; Latex; Metformin and related; Penicillins; Sulfa antibiotics; and Tape   Review of Systems Review of Systems  Constitutional: Negative for chills and fever.  Musculoskeletal: Positive for arthralgias and joint swelling.  Skin: Negative for color change, rash and wound.  Neurological: Negative for weakness and numbness.     Physical Exam Updated Vital Signs BP (!) 157/67 (BP Location: Right Arm)   Pulse 83   Temp 98.2 F (36.8 C) (Oral)   Resp 18   Ht 5\' 4"  (1.626 m)   Wt 101.2 kg  (223 lb)   LMP  (LMP Unknown)   SpO2 96%   BMI 38.28 kg/m   Physical Exam  Constitutional: She appears well-developed and well-nourished. No distress.  HENT:  Head: Normocephalic and atraumatic.  Eyes: Right eye exhibits no discharge. Left eye exhibits no discharge.  Pulmonary/Chest: Effort normal. No respiratory distress.  Musculoskeletal:  There is swelling and tenderness over the left lateral malleolus. No overt deformity. No tenderness over the medial aspect of the ankle. The fifth metatarsal is not tender. The ankle joint is intact without excessive opening on stressing.  No tenderness or swelling at the knee  Neurological: She is alert. Coordination normal.  Skin: Skin is warm and dry. She is not diaphoretic.  Psychiatric: She has a normal mood and affect. Her behavior is normal.  Nursing note and vitals reviewed.    ED Treatments / Results  Labs (all labs ordered are listed, but only  abnormal results are displayed) Labs Reviewed - No data to display  EKG None  Radiology Dg Ankle Complete Left  Result Date: 03/11/2018 CLINICAL DATA:  57 year old female status post trip and fall down stairs 3 days ago with continued ankle pain and swelling. EXAM: LEFT ANKLE COMPLETE - 3+ VIEW COMPARISON:  Left foot series 08/15/2017. FINDINGS: Chronic calcified peripheral vascular disease. No ankle joint effusion is evident. Mortise joint alignment is preserved. The talar dome is intact. Calcaneus appears stable and intact. Degenerative appearing spurring of the medial malleolus. No convincing acute fracture of the distal left tibia. The distal fibula appears intact. Other visible bones of the left foot appear intact. IMPRESSION: 1. No acute fracture or dislocation identified about the left ankle. 2. Calcified peripheral vascular disease. Electronically Signed   By: Genevie Ann M.D.   On: 03/11/2018 19:19    Procedures Procedures (including critical care time)  Medications Ordered in  ED Medications  acetaminophen (TYLENOL) tablet 1,000 mg (1,000 mg Oral Given 03/11/18 1948)     Initial Impression / Assessment and Plan / ED Course  I have reviewed the triage vital signs and the nursing notes.  Pertinent labs & imaging results that were available during my care of the patient were reviewed by me and considered in my medical decision making (see chart for details).  Presentation consistent with ankle sprain. Tenderness and swelling over lateral malleolus, pt is neurovascularly intact, and x-ray negative for fracture, and shows ankle mortise is intact. Pain treated in the ED. Pt placed in ASO brace and provided crutches, ambulated without difficulty. Pt stable for discharge home with tylenol for pain. Pt to follow-up with PCP in one week if symptoms not improving. Return precautions discussed, Pt expresses understanding and agrees with plan.   Final Clinical Impressions(s) / ED Diagnoses   Final diagnoses:  Sprain of left ankle, unspecified ligament, initial encounter    ED Discharge Orders    None       Janet Berlin 03/11/18 2337    Virgel Manifold, MD 03/11/18 2350

## 2018-03-11 NOTE — Discharge Instructions (Signed)
X-ray shows no evidence of fractures, pain due to an ankle sprain.  Use ankle brace and crutches, trying ice and elevate the ankle as much as possible, the more you stay off of it the more your pain will improve.  You may as needed after 1 week if symptoms not improving please follow-up with your primary care doctor.  Return for significantly worsening pain, swelling, redness or fevers.

## 2018-03-11 NOTE — ED Triage Notes (Signed)
Left ankle injury. 3 days ago she was coming down steps and tripped.

## 2018-03-11 NOTE — ED Notes (Signed)
Pt states she does not have the balance to use crutches. Pt instructed she can find a cane or knee scooter from the pharmacy if needed.

## 2018-03-12 ENCOUNTER — Other Ambulatory Visit: Payer: Self-pay

## 2018-03-12 ENCOUNTER — Telehealth: Payer: Self-pay | Admitting: Licensed Clinical Social Worker

## 2018-03-12 NOTE — Progress Notes (Signed)
Type of Service: Clinical Social Work  LCSW received phone call from patient, sound like she is in distress, states not feeling well mentally, she is not herself, currently in stressful situation with housing, is renting a room in [place] at [employer].  Patient tearful. The following was discussed:  mental health status and concerns, does not think medication is working, had SI, indicated would not act on thoughts, discussed safety plan, who is currently with patient, reviewed and demonstrated relaxed breathing with patient, reviewed and provided phone numbers for community resources in [place] (walk-in at [place]).  Patient appreciative of information and assistance.  Reassured LCSW she would not act on thoughts and states she is not having them at this moment.  Patient called [facility].  Waiting for them to call her back. [facility] contacted LCSW to confirm patient called.  [facility] unable to reach patient by phone will do a site visit at her home. Intervention: emotional support, Reflective listening, Charity fundraiser , Mindfulness or Relaxation Training Plan:  1. [facility] will F/U with patient in a few hours. 2. Patient will go to [place] today when her ride arrives  3. Patient has an appointment with PCP Monday and LCSW 03/21/18  4. LCSW will F/U with patient tomorrow.  5. Patient will call LCSW if needed or call [facility] back today  Casimer Lanius, LCSW Licensed Clinical Social Worker [place]   [phone] 11:42 AM

## 2018-03-13 ENCOUNTER — Telehealth: Payer: Self-pay | Admitting: Licensed Clinical Social Worker

## 2018-03-13 NOTE — Progress Notes (Signed)
Service : [place] F/U Call   F/U call to patient.  Per patient she is doing much better today, she has a refill on her mental health medications .  State [facility] has police come to her home.  Patient  Sounds better and is calm. Patient appreciative of F/U call from LCSW Plan: Patient will meet with LCSW for [facility] appointment next week. Also has an appointment with medical provider.  Casimer Lanius, LCSW Licensed Clinical Social Worker [place]   [phone] 11:52 AM

## 2018-03-18 ENCOUNTER — Ambulatory Visit: Payer: Medicare Other | Admitting: Family Medicine

## 2018-03-19 DIAGNOSIS — E113513 Type 2 diabetes mellitus with proliferative diabetic retinopathy with macular edema, bilateral: Secondary | ICD-10-CM | POA: Diagnosis not present

## 2018-03-20 ENCOUNTER — Ambulatory Visit: Payer: Self-pay

## 2018-03-24 ENCOUNTER — Encounter: Payer: Self-pay | Admitting: Family Medicine

## 2018-03-24 NOTE — Progress Notes (Deleted)
Subjective:   Patient ID: MRN [id number]  Date of birth: May 06, 1961  HPI Tamara Henry is a 57 y.o. female with past medical history significant for HTN, peripheral neuropathy 2/2 DM1, Ashtma,  presents to clinic with complaints of ankle sprain.   #1: *** Location: *** Quality: *** Quantity: *** Timing: *** Setting: *** Modifying Factors: *** Associated: *** Pt Concerns: ***   #2: *** Location: *** Quality: *** Quantity: *** Timing: *** Setting: *** Modifying Factors: *** Associated: *** Pt Concerns: ***   #3: ***  Location: *** Quality: *** Quantity: *** Timing: *** Setting: *** Modifying Factors: *** Associated: *** Pt Concerns: ***   Review of Symptoms:  14 Systems negative except for those mentioned in HPI.    Medications/Allergies:  Reviewed. No reported changes.   Pertinent Past Medical History:  *** Patient Active Problem List   Diagnosis Date Noted  . Pain in lower jaw 01/16/2018  . Vaginal bleeding 10/09/2017  . Postmenopausal vaginal bleeding 10/04/2017  . Diarrhea 08/04/2017  . Arm pain, anterior, right 06/17/2017  . OSA on CPAP 05/03/2017  . Morbid obesity due to excess calories ([place]) 05/03/2017  . Microscopic hematuria 03/27/2017  . Impingement syndrome of left shoulder 02/19/2017  . Hypersomnia 12/12/2016  . REM behavioral disorder 12/12/2016  . Asthma 12/12/2016  . Neck pain 11/17/2016  . CKD (chronic kidney disease), stage III ([place]) 11/02/2016  . CAD (coronary artery disease), native coronary artery 10/26/2016  . Poor social situation 10/13/2016  . Leg pain 08/30/2016  . Back pain 08/30/2016  . 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 Hyperlipidemia 05/17/2016  . Depression 05/17/2016  . Diabetic peripheral neuropathy ([place]) 05/17/2016  . Neuritis of upper extremity, C7 01/15/2015  . Hypertension 01/14/2015  . Numbness and tingling of right arm 01/14/2015    Psychiatric History:  ***  Health Maintenance:  - *** Health Maintenance Due    Topic Date Due  . TETANUS/TDAP  07/07/1982  . COLONOSCOPY  07/07/2013     Pertinent Family History:  *** Family History  Problem Relation Age of Onset  . Pancreatitis Mother   . Diabetes type II Mother   . Diabetes Mother   . CAD Father   . Heart attack Father   . Diabetes type II Sister   . Diabetes Sister   . Diabetes type II Brother   . Diabetes Brother   . Diabetes Brother   . Diabetes Brother   . Clotting disorder Son        heart attack x 3   . Breast cancer Maternal Aunt   . Colon cancer Neg Hx   . Stomach cancer Neg Hx      Pertinent Social History:  Widowed. Patient lives with daughter Arley [place]ix and 4 grandchildren in one level home 06/26/2017 Transportation: Patient currently relies on medicaid transportation 06/26/2017 Important Relationships [place] in [place] and [place] here 06/26/2017  Pets: None 06/26/2017 Education / Work:  12 th grade/ Disabled 06/26/2017 Interests / Fun: Go to [employer] and church 06/26/2017 Current Stressors: Health and two sons and godson incarcerated 06/26/2017 Religious / Personal Beliefs: Pentacostal 06/26/2017 Other: Need help with mental health 06/26/2017  reports that she quit smoking about 12 years ago. Her smoking use included cigarettes. She has a 20.00 pack-year smoking history. She has never used smokeless tobacco. She reports that she has current or past drug history. Drug: Cocaine. She reports that she does not drink alcohol.    Objective:  Ht 5\' 4"  (1.626 m)  Wt 226 lb (102.5 kg)   LMP  (LMP Unknown)   BMI 38.79 kg/m    Physical Exam:  Gen: NAD, alert, non-toxic, well-nourished, well-appearing, pleasant HEENT: Normocephaic, atraumatic. PERRLA, clear conjuctiva, no scleral icterus and injection. Normal EOM.  Hearing intact. TM pearly grey bilaterally with no fluid.  Neck supple with no LAD, nodules, or gross abnormality.  Nares patent with no discharge.  Maxillary and frontal sinuses nontender to palpation.  Oropharynx  without erythema and lesions.  Tonsils nonswollen and without exudate.   CV: Regular rate and rhythm.  Normal S1-S2.  No murmur, gallops, S3, S4 appreciated.  Normal capillary refill bilaterally.  Radial pulses 2+ bilaterally. No bilateral lower extremity edema. Resp: Clear to auscultation bilaterally.  No wheezing, rales, abnormal lung sounds.  No increased work of breathing appreciated. Abd: Nontender and nondistended on palpation to all 4 quadrants.  Positive bowel sounds. Skin: No obvious rashes, lesions, or trauma.  Normal turgor.  MSK: Normal ROM. Normal strength and tone.  Neuro: Cranial nerves II through VI grossly intact. Gait normal.  Alert and oriented x4.  No obvious abnormal movements. Psych: Good insight, cooperative with exam.  Normal judgment. Genitourinary: deferred.   Pertinent Labs & Imaging:  *** Results Tab***  Labs reviewed.    Assessment & Plan:  There are no diagnoses linked to this encounter. No problem-specific Assessment & Plan notes found for this encounter.    Zettie Cooley, M.D. 03/24/2018, 6:26 PM PGY-1, [facility]

## 2018-03-25 ENCOUNTER — Encounter: Payer: Self-pay | Admitting: Family Medicine

## 2018-03-25 ENCOUNTER — Other Ambulatory Visit: Payer: Self-pay

## 2018-03-25 ENCOUNTER — Ambulatory Visit (INDEPENDENT_AMBULATORY_CARE_PROVIDER_SITE_OTHER): Payer: Medicare Other | Admitting: Family Medicine

## 2018-03-25 DIAGNOSIS — S93402D Sprain of unspecified ligament of left ankle, subsequent encounter: Secondary | ICD-10-CM | POA: Diagnosis not present

## 2018-03-25 DIAGNOSIS — Z659 Problem related to unspecified psychosocial circumstances: Secondary | ICD-10-CM | POA: Diagnosis not present

## 2018-03-25 DIAGNOSIS — S93409A Sprain of unspecified ligament of unspecified ankle, initial encounter: Secondary | ICD-10-CM | POA: Insufficient documentation

## 2018-03-25 MED ORDER — TIZANIDINE HCL 4 MG PO TABS: 4.0000 mg | ORAL_TABLET | Freq: Four times a day (QID) | ORAL | 0 refills | Status: DC | PRN

## 2018-03-25 MED ORDER — DIPHENOXYLATE-ATROPINE 2.5-0.025 MG PO TABS: 1.0000 | ORAL_TABLET | Freq: Four times a day (QID) | ORAL | 0 refills | Status: DC | PRN

## 2018-03-25 MED ORDER — PANTOPRAZOLE SODIUM 40 MG PO TBEC: 40.0000 mg | DELAYED_RELEASE_TABLET | Freq: Two times a day (BID) | ORAL | 3 refills | Status: DC

## 2018-03-25 MED ORDER — ASPIRIN 81 MG PO TBEC
81.0000 mg | DELAYED_RELEASE_TABLET | Freq: Every day | ORAL | 12 refills | Status: DC
Start: 2018-03-25 — End: 2018-04-25

## 2018-03-25 MED ORDER — PILL CASE MISC: 1.0000 [IU] | Freq: Every day | 0 refills | Status: DC

## 2018-03-25 NOTE — Patient Instructions (Addendum)
Dear Tamara Henry,   It was nice to see you today! I am glad you came in for your concerns. This document serves as a "wrap-up" to all that we discussed today and is listed as follows:    Your primary concern today was your sore ankle.  Remember to rest her ankle throughout the day.  I have attached the rice instructions that we discussed during your visit.  We refilled some medications today, and I sent in prescriptions to your pharmacy for daily pill organizers.  I am going to send your information to our clinical social worker, Casimer Lanius, and let her know about some of the social issues that you have been going through.  I think it is important that we address these so that we can get your health needs in order.  I will let her know about your reports of food insecurity, difficulty organizing your pills and taking certain medications because of your poor eyesight.  I will also let her know that your daughter use to help you with your health, but she is not in [place] with you anymore.  No follow-up is necessary. Please request a follow up appointment if you experience any worsening symptoms. If your symptoms are life threatening, please go immediately to the nearest Emergency Department.   Thank you for choosing [facility] for your primary care needs and stay well!   Best,   Dr. Zettie Cooley, PGY-1 Resident Physician [facility] [phone]    Don't forget to sign up for MyChart for instant access to your health profile, labs, orders, upcoming appointments or to contact your provider with questions. Stop at the front desk on the way out for more information about how to sign up!   RICE for Routine Care of Injuries  Many injuries can be cared for using rest, ice, compression, and elevation (RICE therapy). Using RICE therapy can help to lessen pain and swelling. It can help your body to heal. Rest Reduce your normal activities and avoid using the  injured part of your body. You can go back to your normal activities when you feel okay and your doctor says it is okay. Ice Do not put ice on your bare skin.  Put ice in a plastic bag.  Place a towel between your skin and the bag.  Leave the ice on for 20 minutes, 2-3 times a day.  Do this for as long as told by your doctor. Compression Compression means putting pressure on the injured area. This can be done with an elastic bandage. If an elastic bandage has been applied:  Remove and reapply the bandage every 3-4 hours or as told by your doctor.  Make sure the bandage is not wrapped too tight. Wrap the bandage more loosely if part of your body beyond the bandage is blue, swollen, cold, painful, or loses feeling (numb).  See your doctor if the bandage seems to make your problems worse.  Elevation Elevation means keeping the injured area raised. Raise the injured area above your heart or the center of your chest if you can. When should I get help? You should get help if:  You keep having pain and swelling.  Your symptoms get worse.  Get help right away if: You should get help right away if:  You have sudden bad pain at or below the area of your injury.  You have redness or more swelling around your injury.  You have tingling or numbness at or  below the injury that does not go away when you take off the bandage.  This information is not intended to replace advice given to you by your health care provider. Make sure you discuss any questions you have with your health care provider. Document Released: 02/14/2008 Document Revised: 07/25/2016 Document Reviewed: 08/05/2014 Elsevier Interactive Patient Education  2017 [employer].

## 2018-03-25 NOTE — Progress Notes (Signed)
Subjective:   Patient ID: MRN [id number]  Date of birth: 12/26/1960  HPI Lauretta Sallas is a 57 y.o. 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   #1:  Left ankle soreness Ms. Hepp primary complaint today is left ankle pain associated with a fall 3 weeks ago during moving from [place].  She was seen in the emergency department and images were taken.  She was diagnosed with a sprain and discharged with an ankle brace.  She reports being able to walk on the left foot, however, she does notice that pain is worse with longer use.  Associated symptoms include falling 4 times in the last 2 weeks due to her ankle giving out.  She denies any sustained injuries from these falls.   Pt denies weight gain/loss, changes in appetite, headache, dizziness, syncope,  palpitations, chest pain or pressure, shortness of breath, dyspnea on exertion, nausea, vomiting, changes in bowel movement/habits, pain, urinary symptoms.   #2 Social Concerns:   Patient has just moved here to [place] from [place], after being evicted from her last department.  She reports many of her medications being left in [place] and that she is just beginning to get things back together.  She explains that her daughter use to help her with her health maintenance, but daughter is currently living in [place].  It makes it hard for [place] to take her medications, as she has poor vision and poor memory.  She reports an incidence where she took the wrong insulin and felt very sick afterwards.  No other injuries were sustained from this incident.  She also reports having food insecurities, she is currently working to receive government assistance.  She is currently living in board housing where she has just received a flashlight so that she can better read her medications, as her light has gone out in her room.   Review of  Symptoms:  14 Systems negative except for those mentioned in HPI.   Medications/Allergies:  Reviewed. No reported changes.   Pertinent Past Medical History:  IDDM, hypertension, hyperlipidemia, CAD status post CABG x4, peripheral neuropathy secondary to diabetes  Pertinent Social History:  Widowed.  Patient is currently living by herself and board housing after recently moving from [place] 3 weeks ago due to eviction.  Her daughter, who helps her with her health maintenance, now lives in [place]. Transportation: Patient currently relies on medicaid transportation 06/26/2017 Important Relationships [place] in [place] and [place] here 06/26/2017  Pets: None 06/26/2017 Education / Work:  12 th grade/ Disabled 06/26/2017 Interests / Fun: Go to [employer] and church 06/26/2017 Current Stressors: Health and two sons and godson incarcerated 06/26/2017 Religious / Personal Beliefs: Pentacostal 06/26/2017 Other: Need help with mental health 06/26/2017  reports that she quit smoking about 12 years ago. Her smoking use included cigarettes. She has a 20.00 pack-year smoking history. She has never used smokeless tobacco. She reports that she has current or past drug history. Drug: Cocaine. She reports that she does not drink alcohol.    Objective:  Ht 5\' 4"  (1.626 m)   Wt 226 lb (102.5 kg)   LMP  (LMP Unknown)   BMI 38.79 kg/m    Physical Exam:  Gen: NAD, alert, non-toxic, well-nourished, well-appearing, pleasant Skin: No obvious rashes, lesions, or trauma.  Normal turgor.  MSK: Normal range of movement in knees bilaterally.  No obvious swelling, erythema, tenderness  to palpation bilaterally.  Left ankle with normal active and passive range of movement.  Palpation to posterior distal fibula elicits pain.  Area is also mildly swollen as compared to the right side.  Patient is able to bear weight on left leg.  Gait appears normal. Psych: Good insight, cooperative with exam.  Normal  judgment.   Pertinent Labs & Imaging:  Labs reviewed.    Assessment & Plan:  Sprain of ankle Today she reports that her ankle is still painful and swollen.  She is able to bear weight and has normal gait.  On physical exam, she is moderately tender to palpation at the posterior aspects of her distal fibula.  There is some mild tissue swelling in this area.  Otherwise she has normal active and passive range of motion.  Would like patient to keep following rice rules.  Encourage patient to continue using brace throughout the day try not to walk on it is much as possible to avoid increased swelling and pain and injury from overuse during healing process.  Expectant management: Discussed with patient that these injuries can take up to 6 to 8 weeks for full healing.  But healing will be delayed if the injury is aggravated.  Patient is agreeable to plan today.  Poor social situation I have sent a communication to clinical social worker, Casimer Lanius.  In the meantime, I have sent electronically for pill organizers from the pharmacy, as patient reports not being able to afford this resource, and encourage patient to speak with pharmacist about how to take medications and what to take while she is there.  I have also provided an elastic bandage and education for further support of her ankle.      Zettie Cooley, M.D. 03/24/2018, 6:26 PM PGY-1, [place]

## 2018-03-25 NOTE — Assessment & Plan Note (Addendum)
I have sent a communication to clinical social worker, Casimer Lanius.  In the meantime, I have sent electronically for pill organizers from the pharmacy, as patient reports not being able to afford this resource, and encourage patient to speak with pharmacist about how to take medications and what to take while she is there.  I have also provided an elastic bandage and education for further support of her ankle.

## 2018-03-25 NOTE — Assessment & Plan Note (Addendum)
Today she reports that her ankle is still painful and swollen.  She is able to bear weight and has normal gait.  On physical exam, she is moderately tender to palpation at the posterior aspects of her distal fibula.  There is some mild tissue swelling in this area.  Otherwise she has normal active and passive range of motion.  Would like patient to keep following rice rules.  Encourage patient to continue using brace throughout the day try not to walk on it is much as possible to avoid increased swelling and pain and injury from overuse during healing process.  Expectant management: Discussed with patient that these injuries can take up to 6 to 8 weeks for full healing.  But healing will be delayed if the injury is aggravated.  Patient is agreeable to plan today.

## 2018-03-26 ENCOUNTER — Other Ambulatory Visit: Payer: Self-pay | Admitting: Family Medicine

## 2018-03-27 ENCOUNTER — Ambulatory Visit: Payer: Medicare Other | Admitting: Licensed Clinical Social Worker

## 2018-03-27 DIAGNOSIS — F439 Reaction to severe stress, unspecified: Secondary | ICD-10-CM

## 2018-03-27 NOTE — Progress Notes (Signed)
Type of Service: [place] F/U Visit Total time:35 minutes :  Interpreter:No.    Reason for follow-up: Continue brief intervention to assist patient with managing psychosocial stressors . Reports no difficulty in daily functions. Patient is pleasant and engaged in conversation.   Appearance:Neat ; Thought process: Coherent; Affect: Appropriate: No plan to harm self or others  Patient is making progress towards maintaining mental health goal.  States she is feeling better. This is also evident by PHQ-9/GAD score. PHQ 9=0,. GAD-7=0, .  Goals: Patient will  1. Maintain reduced symptoms of: anxiety and depression ,  2. increase ability XY:VOPFYT reduction,  3. Go to [employer]  Intervention: Motivational Interviewing and Solution-Focused Strategies, Reflective listening, ; [employer], Conservator, museum/gallery strategies and Psychoeducation, and Food box provided today.  Issues discussed: food insecurities  ; new housing situtation ; mood medication compliance ; applied for food benefits ; difficulties with landlord, resources for ongoing therapy in [place]. Assessment:Patient is doing great with taking mood medication, she will start going to the Gym and has been approved for food benefits.  She continues to experience social stressors and is doing well with managing them.   Patient may benefit from, and is in agreement to seek ongoing therapeutic in [place] to assist with managing and maintaining her symptoms when needed.  Plan:  1. Patient will contact provided on list to locate therapist in [facility] 2. Behavioral recommendations: continue relaxed breathing, behavior activation 3. Referral: Counselor  4. Patient will contact LCSW if needed.  Casimer Lanius, LCSW Licensed Clinical Social Worker [place]   [phone] 12:48 PM

## 2018-04-03 ENCOUNTER — Ambulatory Visit: Payer: Medicare Other | Admitting: Physical Therapy

## 2018-04-12 ENCOUNTER — Encounter: Payer: Self-pay | Admitting: Podiatry

## 2018-04-12 ENCOUNTER — Ambulatory Visit: Payer: Medicaid Other

## 2018-04-12 ENCOUNTER — Ambulatory Visit (INDEPENDENT_AMBULATORY_CARE_PROVIDER_SITE_OTHER): Payer: Medicare Other | Admitting: Podiatry

## 2018-04-12 DIAGNOSIS — E1149 Type 2 diabetes mellitus with other diabetic neurological complication: Secondary | ICD-10-CM

## 2018-04-12 DIAGNOSIS — T1490XA Injury, unspecified, initial encounter: Secondary | ICD-10-CM

## 2018-04-12 DIAGNOSIS — B351 Tinea unguium: Secondary | ICD-10-CM

## 2018-04-12 DIAGNOSIS — E1142 Type 2 diabetes mellitus with diabetic polyneuropathy: Secondary | ICD-10-CM

## 2018-04-12 DIAGNOSIS — M79674 Pain in right toe(s): Secondary | ICD-10-CM

## 2018-04-12 DIAGNOSIS — M79675 Pain in left toe(s): Secondary | ICD-10-CM | POA: Diagnosis not present

## 2018-04-12 NOTE — Progress Notes (Signed)
Complaint:  Visit Type: Patient returns to my office for continued preventative foot care services. Complaint: Patient states" my nails have grown long and thick and become painful to walk and wear shoes" Patient has been diagnosed with DM with  foot neuropathy. The patient presents for preventative foot care services. No changes to ROS.     Podiatric Exam: Vascular: dorsalis pedis and posterior tibial pulses are palpable bilateral. Capillary return is immediate. Temperature gradient is WNL. Skin turgor WNL  Sensorium: Diminished  Semmes Weinstein monofilament test. Normal tactile sensation bilaterally. Nail Exam: Pt has thick disfigured discolored nails with subungual debris noted bilateral entire nail hallux through fifth toenails Ulcer Exam: There is no evidence of ulcer or pre-ulcerative changes or infection. Orthopedic Exam: Muscle tone and strength are WNL. No limitations in general ROM. No crepitus or effusions noted. Functional hallux limitus 1st MPJ  B/L. Skin: No Porokeratosis. No infection or ulcers  Diagnosis:  Onychomycosis, , Pain in right toe, pain in left toes  Treatment & Plan Procedures and Treatment: Consent by patient was obtained for treatment procedures.   Debridement of mycotic and hypertrophic toenails, 1  bilateral and clearing of subungual debris. No ulceration, no infection noted. Debride callus on distal aspect hallux  B/L.  Patient needs an appointment with Liliane Channel.  ABN signed for 2019.Marland KitchenAnkle sprain suffered 3 weeks ago has weakness and pain along peroneal tendon.  Continue to wear brace. Return Visit-Office Procedure: Patient instructed to return to the office for a follow up visit 3 months for continued evaluation and treatment.    Gardiner Barefoot DPM

## 2018-04-16 ENCOUNTER — Ambulatory Visit
Admission: RE | Admit: 2018-04-16 | Discharge: 2018-04-16 | Disposition: A | Discharge status: Home / Self Care | Payer: Medicare Other | Source: Ambulatory Visit | Attending: Family Medicine | Admitting: Family Medicine

## 2018-04-16 ENCOUNTER — Other Ambulatory Visit: Payer: Self-pay

## 2018-04-16 DIAGNOSIS — Z1231 Encounter for screening mammogram for malignant neoplasm of breast: Secondary | ICD-10-CM | POA: Diagnosis not present

## 2018-04-17 ENCOUNTER — Other Ambulatory Visit: Payer: Self-pay

## 2018-04-17 NOTE — Patient Outreach (Signed)
[facility]) Care Management  04/17/2018  Krissia Schreier March 10, 1961 [id number]   Referral Date:04/16/18 Referral Source: EMMI prevent Referral Reason: Engagement tool   Outreach Attempt:spoke with patient.  She is able to verify HIPAA.  Discussed referral with patient.  She is interested in the program.  Patient reports that she currently lives in a boarding house.  She does have a daughter that lives in [place] that takes her to church weekly. Patient reports that she has problems affording food and sometimes does not have enough food causing her sugar to drop.  Patient uses medicaid transportation to get to appointments.    Patient admits to DM,  HTN, and Hyperlipidemia. Patient reports that she recently sprained her left ankle and is working on that.  She also states that she is working to get her A1c down and last report it was 8.7. Patient agreeable to disease management and support for diabetes.    Patient takes her medications as prescribed but reports having some trouble seeing labels and wants some type of pill pack for her medications.  Patient reports that she did have pill pack medications previously.     Plan: RN CM will refer to health coach for disease management and support around diabetes. RN CM will refer to social worker for housing and food assistance. RN CM will refer to pharmacy to assist patient in obtaining pill packs.       Jone Baseman, RN, MSN [facility] Care Management Care Management Coordinator Direct Line [phone] Toll Free: [phone]  Fax: [phone]

## 2018-04-18 ENCOUNTER — Encounter: Payer: Self-pay | Admitting: Family Medicine

## 2018-04-18 ENCOUNTER — Ambulatory Visit (INDEPENDENT_AMBULATORY_CARE_PROVIDER_SITE_OTHER): Payer: Medicare Other | Admitting: Family Medicine

## 2018-04-18 ENCOUNTER — Other Ambulatory Visit: Payer: Self-pay

## 2018-04-18 ENCOUNTER — Ambulatory Visit: Payer: Medicare Other | Attending: Family Medicine

## 2018-04-18 ENCOUNTER — Ambulatory Visit (INDEPENDENT_AMBULATORY_CARE_PROVIDER_SITE_OTHER): Payer: Medicare Other | Admitting: Licensed Clinical Social Worker

## 2018-04-18 VITALS — BP 160/80 | HR 80 | Temp 98.9°F | Ht 64.0 in | Wt 228.6 lb

## 2018-04-18 DIAGNOSIS — E118 Type 2 diabetes mellitus with unspecified complications: Secondary | ICD-10-CM | POA: Diagnosis not present

## 2018-04-18 DIAGNOSIS — Z5941 Food insecurity: Secondary | ICD-10-CM

## 2018-04-18 DIAGNOSIS — M545 Low back pain, unspecified: Secondary | ICD-10-CM

## 2018-04-18 DIAGNOSIS — M79642 Pain in left hand: Secondary | ICD-10-CM | POA: Diagnosis not present

## 2018-04-18 DIAGNOSIS — G8929 Other chronic pain: Secondary | ICD-10-CM | POA: Insufficient documentation

## 2018-04-18 DIAGNOSIS — Z794 Long term (current) use of insulin: Secondary | ICD-10-CM | POA: Diagnosis not present

## 2018-04-18 DIAGNOSIS — R293 Abnormal posture: Secondary | ICD-10-CM

## 2018-04-18 DIAGNOSIS — Z0000 Encounter for general adult medical examination without abnormal findings: Secondary | ICD-10-CM

## 2018-04-18 DIAGNOSIS — Z594 Lack of adequate food and safe drinking water: Secondary | ICD-10-CM

## 2018-04-18 DIAGNOSIS — M62830 Muscle spasm of back: Secondary | ICD-10-CM | POA: Diagnosis not present

## 2018-04-18 LAB — POCT GLYCOSYLATED HEMOGLOBIN (HGB A1C): HBA1C, POC (CONTROLLED DIABETIC RANGE): 7.8 % — AB (ref 0.0–7.0)

## 2018-04-18 MED ORDER — GABAPENTIN 300 MG PO CAPS: ORAL_CAPSULE | ORAL | 2 refills | Status: DC

## 2018-04-18 MED ORDER — TIZANIDINE HCL 4 MG PO TABS: ORAL_TABLET | ORAL | 0 refills | Status: DC

## 2018-04-18 MED ORDER — OLMESARTAN MEDOXOMIL 20 MG PO TABS: 20.0000 mg | ORAL_TABLET | Freq: Every day | ORAL | Status: DC

## 2018-04-18 MED ORDER — ATORVASTATIN CALCIUM 80 MG PO TABS: 80.0000 mg | ORAL_TABLET | Freq: Every evening | ORAL | 3 refills | Status: DC

## 2018-04-18 MED ORDER — FUROSEMIDE 40 MG PO TABS: 40.0000 mg | ORAL_TABLET | Freq: Every day | ORAL | 3 refills | Status: DC

## 2018-04-18 MED ORDER — AMLODIPINE BESYLATE 10 MG PO TABS: 10.0000 mg | ORAL_TABLET | Freq: Every day | ORAL | 3 refills | Status: DC

## 2018-04-18 MED ORDER — INSULIN LISPRO 100 UNIT/ML (KWIKPEN): 5.0000 [IU] | PEN_INJECTOR | Freq: Two times a day (BID) | SUBCUTANEOUS | 1 refills | Status: DC

## 2018-04-18 MED ORDER — INSULIN GLARGINE 100 UNIT/ML SOLOSTAR PEN: 50.0000 [IU] | PEN_INJECTOR | Freq: Every day | SUBCUTANEOUS | 99 refills | Status: DC

## 2018-04-18 MED ORDER — CITALOPRAM HYDROBROMIDE 40 MG PO TABS: ORAL_TABLET | ORAL | 11 refills | Status: DC

## 2018-04-18 MED ORDER — CARVEDILOL 12.5 MG PO TABS: 12.5000 mg | ORAL_TABLET | Freq: Two times a day (BID) | ORAL | 3 refills | Status: DC

## 2018-04-18 NOTE — Patient Instructions (Signed)
Issued form cabinet PPT 5-10 reps 5-10 sec hold, Quadratus lumborum stretch 2-3 reps 10-20 sec 2-3x/day , sit flexion 2-3 eps 23- x /day 10-20 se c

## 2018-04-18 NOTE — Progress Notes (Signed)
Subjective:    Tamara Henry - 57 y.o. female MRN [id number]  Date of birth: 1961-02-05  HPI  Tamara Henry is here for medication refills, diabetes follow-up, and left hand pain  Medication refills Patient says that she is working on getting her medications more organized through [employer]  She would like to get most of her medications refilled today Reports that she usually takes all of her medications, but she did not take her blood pressure medicine this morning Reports that the Celexa is working well for her, but she would like for it to be stronger  Left hand pain Reports that she cannot make a fist with her left hand Pain is mostly in the fourth metatarsal area on her palm Thinks this is due to osteoarthritis Does not report any exacerbating or alleviating factors  Diabetes follow-up Reports that she has had some low blood sugars because she has not been able to buy much food since she ran out of her food stamps Would like to meet with Neoma Laming today to discuss how to get emergency food stamps Has not been checking blood sugars Says she is continuing to take Lantus and Humalog   Health Maintenance:  Health Maintenance Due  Topic Date Due  . TETANUS/TDAP  07/07/1982  . COLONOSCOPY  07/07/2013  . INFLUENZA VACCINE  04/11/2018    -  reports that she quit smoking about 12 years ago. Her smoking use included cigarettes. She has a 20.00 pack-year smoking history. She has never used smokeless tobacco. - Review of Systems: Per HPI. - Past Medical History: Patient Active Problem List   Diagnosis Date Noted  . Left hand pain 04/18/2018  . Sprain of ankle 03/25/2018  . Pain in lower jaw 01/16/2018  . Healthcare maintenance 12/11/2017  . Vaginal bleeding 10/09/2017  . Postmenopausal vaginal bleeding 10/04/2017  . Cough 09/28/2017  . Chronic pansinusitis 09/28/2017  . Laryngopharyngeal reflux (LPR) 08/28/2017  . Sensorineural hearing loss (SNHL), bilateral  08/28/2017  . Diarrhea 08/04/2017  . Arm pain, anterior, right 06/17/2017  . OSA on CPAP 05/03/2017  . Morbid obesity due to excess calories ([place]) 05/03/2017  . Microscopic hematuria 03/27/2017  . Impingement syndrome of left shoulder 02/19/2017  . Hypersomnia 12/12/2016  . REM behavioral disorder 12/12/2016  . Asthma 12/12/2016  . Neck pain 11/17/2016  . CKD (chronic kidney disease), stage III ([place]) 11/02/2016  . CAD (coronary artery disease), native coronary artery 10/26/2016  . Poor social situation 10/13/2016  . Leg pain 08/30/2016  . Back pain 08/30/2016  . 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 GERD (gastroesophageal reflux disease) 06/09/2016  . Obesity (BMI 30-39.9) 06/09/2016  . Facial swelling 05/21/2016  . Hyperlipidemia 05/17/2016  . Depression 05/17/2016  . Diabetic peripheral neuropathy ([place]) 05/17/2016  . Neuritis of upper extremity, C7 01/15/2015  . Hypertension 01/14/2015  . Numbness and tingling of right arm 01/14/2015  . Proliferative diabetic retinopathy ([place]) 02/24/2014  . Diabetes mellitus ([place]) 12/18/2013  . Nuclear cataract of both eyes 12/18/2013   - Medications: reviewed and updated   Objective:   Physical Exam BP (!) 160/80   Pulse 80   Temp 98.9 F (37.2 C) (Oral)   Ht 5\' 4"  (1.626 m)   Wt 228 lb 9.6 oz (103.7 kg)   LMP  (LMP Unknown)   SpO2 98%   BMI 39.24 kg/m  Gen: NAD, alert, cooperative with exam, well-appearing, obese CV: RRR, good S1/S2, no murmur, no edema Resp: CTABL, no wheezes, non-labored  Abd: SNTND, BS present, no guarding or organomegaly Musculoskeletal: swollen, nontender proximal interphalangeal joints on L hand, prominent fourth metacarpal tendon that is tender to palpation, 5/5 grip strength, unable to completely close L hand into fist Psych: good insight, alert and oriented     Assessment & Plan:   Healthcare maintenance Refilled patient's Lipitor, Lasix, amlodipine, gabapentin, Benicar, Lantus, Humalog, increased dose of  Celexa to 40 mg/day, and substituted Coreg 12.5 mg twice daily for her previously prescribed Lopressor 25 mg twice daily.  Left hand pain Likely osteoarthritis, but fourth metatarsal tendon is prominent, which may signify early development of trigger finger.  Will obtain hand x-ray.  Diabetes mellitus ([place]) A1c is 7.8 today, down from 8.5 in May.  Patient says this may be due to some prolonged periods where she has not been able to eat.  Discussed that our goal is to avoid hypoglycemia as well as hyperglycemia, and we will have Neoma Laming meet with her today.  We will continue her current doses of Lantus and Humalog.  Would like to see her back in 3 months.    Maia Breslow, M.D. 04/18/2018, 3:59 PM PGY-2, [place]

## 2018-04-18 NOTE — Assessment & Plan Note (Signed)
Refilled patient's Lipitor, Lasix, amlodipine, gabapentin, Benicar, Lantus, Humalog, increased dose of Celexa to 40 mg/day, and substituted Coreg 12.5 mg twice daily for her previously prescribed Lopressor 25 mg twice daily.

## 2018-04-18 NOTE — Progress Notes (Signed)
Type of Service: Clinical Social Work  Social work consult Patient wants to talk with CSW ref. Not having food.   LCSW spoke with patient to assess the need. Overall patient states she is doing well.  She has not connected with a provider for ongoing therapy but states this is on her list of things to do. Patient does not want to be referred for food resources due to lack of transportation to pick up the food. States she will get her food benefits in two days.  LCSW provided patient with food from the [facility] food pantry. Patient appreciative of food.    Intervention: emotional support, Reflective listening, food from pantry  Casimer Lanius, [place] Licensed Clinical Social Worker [place]   [phone] 4:07 PM

## 2018-04-18 NOTE — Assessment & Plan Note (Signed)
A1c is 7.8 today, down from 8.5 in May.  Patient says this may be due to some prolonged periods where she has not been able to eat.  Discussed that our goal is to avoid hypoglycemia as well as hyperglycemia, and we will have Neoma Laming meet with her today.  We will continue her current doses of Lantus and Humalog.  Would like to see her back in 3 months.

## 2018-04-18 NOTE — Patient Instructions (Signed)
It was nice meeting you today Tamara Henry!  Today, we ordered an x-ray for your hand at [facility].  We also refilled many medications.  Please call us if you have any questions about your medications.  We also checked your A1c today.  It is lower than it was in May and is 7.8 today.  We will continue working on controlling your diabetes to get your A1c less than 7.0 in the future.    I would like to see you again in three months.  If you have any questions or concerns, please feel free to call the clinic.   Be well,  Dr. Shan Levans

## 2018-04-18 NOTE — Assessment & Plan Note (Addendum)
Likely osteoarthritis, but fourth metatarsal tendon is prominent, which may signify early development of trigger finger.  Will obtain hand x-ray.

## 2018-04-18 NOTE — Therapy (Signed)
[place], [place], [address] Phone: [phone]   Fax:  [phone]  Physical Therapy Evaluation  Patient Details  Name: Tamara Henry MRN: [id number] Date of Birth: 07-Jun-1961 Referring Provider: Howie Ill, MD   Encounter Date: 04/18/2018  PT End of Session - 04/18/18 1333    Visit Number  1    Number of Visits  12    Date for PT Re-Evaluation  05/31/18    PT Start Time  0130    PT Stop Time  0215    PT Time Calculation (min)  45 min    Activity Tolerance  Patient tolerated treatment well;Patient limited by pain    Behavior During Therapy  [facility] for tasks assessed/performed       Past Medical History:  Diagnosis Date  . Acid reflux   . Allergy    seasonal  . Anxiety   . Arthritis    lower back, feet  . Asthma    albuterol inhaler - rarely uses - only needs when she gets sick  . Back pain   . Bilateral pulmonary embolism ([place]) 05/2016  . Bipolar disorder ([place])   . CAD in native artery    S/P NSTEMI with cath showing severe 3 vessel ASCAD s/p CABGx4 07/28/16.  . Cataracts, bilateral   . CKD (chronic kidney disease), stage III (HCC)    borderline CKD II-III  . Cocaine abuse ([place])    In remission. Stopped using in early 2000's  . Depression   . Diabetes mellitus ([place])    Type II  . GERD (gastroesophageal reflux disease)   . Glaucoma   . High cholesterol   . History of blood transfusion 2017  . History of pulmonary embolus (PE)   . 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 Hx of chest tube placement right   . Hypertension   . Morbid obesity ([place])   . Myocardial infarction ([place]) 2017  . Neuromuscular disorder (HCC)    neuropathy in hands and feet  . Neuropathy    hands and feet  . Pneumonia   . Postoperative anemia 07/2016  . PTSD (post-traumatic stress disorder)   . Sleep apnea    Does not use cpap  . Stroke [facility]) 2005   no residual  . SVD (spontaneous vaginal delivery)    x 4    Past  Surgical History:  Procedure Laterality Date  . CARDIAC CATHETERIZATION N/A 07/25/2016   Procedure: Left Heart Cath and Coronary Angiography;  Surgeon: Peter M Martinique, MD;  Location: [facility];  Service: Cardiovascular;  Laterality: N/A;  . COLONOSCOPY    . COLONOSCOPY W/ POLYPECTOMY    . CORONARY ARTERY BYPASS GRAFT N/A 07/28/2016   Procedure: CORONARY ARTERY BYPASS GRAFTING (CABG)x4 using left internal mammary and endoscopic harvest of right greater saphenous vein;  Surgeon: Ivin Poot, MD;  Location: [facility];  Service: Open Heart Surgery;  Laterality: N/A;  . DENTAL SURGERY     to removed broken teeth  . HYSTEROSCOPY W/D&C N/A 01/31/2018   Procedure: DILATATION AND CURETTAGE /HYSTEROSCOPY;  Surgeon: Woodroe Mode, MD;  Location: [facility];  Service: Gynecology;  Laterality: N/A;  . POLYPECTOMY    . PULMONARY EMBOLISM SURGERY  2017   [facility]  . SHOULDER ARTHROSCOPY WITH ROTATOR CUFF REPAIR Left 02/19/2017   Procedure: LEFT SHOULDER ARTHROSCOPY with debridement andROTATOR CUFF REPAIR;  Surgeon: [name], MD;  Location: [facility];  Service: Orthopedics;  Laterality: Left;  .  TEE WITHOUT CARDIOVERSION N/A 07/28/2016   Procedure: TRANSESOPHAGEAL ECHOCARDIOGRAM (TEE);  Surgeon: Ivin Poot, MD;  Location: [facility];  Service: Open Heart Surgery;  Laterality: N/A;  . TONSILLECTOMY    . TUBAL LIGATION      There were no vitals filed for this visit.   Subjective Assessment - 04/18/18 1343    Subjective  She reports fall 2years ago with sacral pain and now  LBP. She has also ahd LT ankle sprain with falls    Limitations  Sitting   getting from sitting, turning in bed   Currently in Pain?  No/denies    Pain Score  8    in past week   Pain Location  Back    Pain Orientation  Lower;Right    Pain Descriptors / Indicators  Sharp;Aching    Pain Type  Chronic pain    Pain Onset  More than a month ago    Pain Frequency  Intermittent    Aggravating Factors   getting out of chair,  rolling in bed    Pain Relieving Factors  rest , muscle relaxers    Multiple Pain Sites  No         [facility] PT Assessment - 04/18/18 0001      Assessment   Medical Diagnosis  Chronic back pain.     Referring Provider  Howie Ill, MD    Onset Date/Surgical Date  --   6 months.    Next MD Visit  Today    Prior Therapy  No      Precautions   Precautions  None      Restrictions   Weight Bearing Restrictions  No      Balance Screen   Has the patient fallen in the past 6 months  Yes    How many times?  4    LT ankle gives out    Has the patient had a decrease in activity level because of a fear of falling?   No    Is the patient reluctant to leave their home because of a fear of falling?   No      Prior Function   Level of Independence  Independent    Vocation  Unemployed      Cognition   Overall Cognitive Status  Within Functional Limits for tasks assessed      ROM / Strength   AROM / PROM / Strength  AROM;Strength;PROM      AROM   AROM Assessment Site  Lumbar    Lumbar Flexion  70    Lumbar Extension  20    Lumbar - Right Side Bend  15    Lumbar - Left Side Bend  20    Lumbar - Right Rotation  --   pain with Lt LEG crossed over RT knee going LT     PROM   Overall PROM Comments  Hip IR 10-15 degrees bilateral      Strength   Overall Strength Comments  LE normal bilateral      Flexibility   Soft Tissue Assessment /Muscle Length  yes    Hamstrings  60 degrees bilateral SLR       Palpation   Palpation comment  stiffness of spine and soft tissue in lower back.                 Objective measurements completed on examination: See above findings.      [facility] Adult PT Treatment/Exercise - 04/18/18 0001  Exercises   Exercises  Lumbar      Lumbar Exercises: Stretches   Other Lumbar Stretch Exercise  quadratus stretc to Lt 10 sec x 2      Lumbar Exercises: Supine   Pelvic Tilt  5 reps;5 seconds             PT Education - 04/18/18  1415    Education Details  HEP , POC    Person(s) Educated  Patient    Methods  Explanation;Demonstration;Verbal cues;Handout;Tactile cues    Comprehension  Verbalized understanding;Returned demonstration       PT Short Term Goals - 04/18/18 1419      PT SHORT TERM GOAL #1   Title  She will be independent with initial HEP    Time  3    Period  Weeks    Status  New      PT SHORT TERM GOAL #2   Title  She will report 30% decr in intensity of episodes of LBP    Time  3    Period  Weeks    Status  New        PT Long Term Goals - 04/18/18 1517      PT LONG TERM GOAL #1   Title  She will be independent with all HEP issued    Time  6    Period  Weeks    Status  New      PT LONG TERM GOAL #2   Title  She will report episodes of LBP dec to once over 2 weeks     Time  6    Period  Weeks    Status  New      PT LONG TERM GOAL #3   Title  When having episode of pain max pain 3/10    Time  6    Period  Weeks    Status  New      PT LONG TERM GOAL #4   Title  She will demo understaning of good standing and sitting psoture    Time  6    Period  Weeks    Status  New             Plan - 04/18/18 1415    Clinical Impression Statement  Tamara Henry presents with chronic intermittant RT side LBP with spasm. No pain today but pain with severe spasms limts activity during that period of time . She demo sway back , poor abdominal tone and stiffness of spine.   She should improve eith skilled PT  and consistent HEp    History and Personal Factors relevant to plan of care:  chronic Lt LBP post fall 2 yeara ago, , obesity , extended posture in standing   sway   Clinical Presentation  Stable    Clinical Decision Making  Low    Rehab Potential  Good    PT Frequency  2x / week    PT Duration  6 weeks    PT Treatment/Interventions  Manual techniques;Taping;Dry needling;Patient/family education;Therapeutic exercise;Therapeutic activities;Moist Heat    PT Next Visit Plan  Review HEp ,  manual for spine and hip stiffness, MHP if needed add to HEP    PT Home Exercise Plan  PPT, quadratus stretch, tennis ball STW    Consulted and Agree with Plan of Care  Patient       Patient will benefit from skilled therapeutic intervention in order to improve the following deficits and impairments:  Pain, Increased muscle spasms, Postural dysfunction, Decreased range of motion, Decreased strength  Visit Diagnosis: Chronic right-sided low back pain without sciatica  Abnormal posture  Muscle spasm of back     Problem List Patient Active Problem List   Diagnosis Date Noted  . Left hand pain 04/18/2018  . Sprain of ankle 03/25/2018  . Pain in lower jaw 01/16/2018  . Healthcare maintenance 12/11/2017  . Vaginal bleeding 10/09/2017  . Postmenopausal vaginal bleeding 10/04/2017  . Cough 09/28/2017  . Chronic pansinusitis 09/28/2017  . Laryngopharyngeal reflux (LPR) 08/28/2017  . Sensorineural hearing loss (SNHL), bilateral 08/28/2017  . Diarrhea 08/04/2017  . Arm pain, anterior, right 06/17/2017  . OSA on CPAP 05/03/2017  . Morbid obesity due to excess calories ([place]) 05/03/2017  . Microscopic hematuria 03/27/2017  . Impingement syndrome of left shoulder 02/19/2017  . Hypersomnia 12/12/2016  . REM behavioral disorder 12/12/2016  . Asthma 12/12/2016  . Neck pain 11/17/2016  . CKD (chronic kidney disease), stage III ([place]) 11/02/2016  . CAD (coronary artery disease), native coronary artery 10/26/2016  . Poor social situation 10/13/2016  . Leg pain 08/30/2016  . Back pain 08/30/2016  . 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 GERD (gastroesophageal reflux disease) 06/09/2016  . Obesity (BMI 30-39.9) 06/09/2016  . Facial swelling 05/21/2016  . Hyperlipidemia 05/17/2016  . Depression 05/17/2016  . Diabetic peripheral neuropathy ([place]) 05/17/2016  . Neuritis of upper extremity, C7 01/15/2015  . Hypertension 01/14/2015  . Numbness and tingling of right arm 01/14/2015  . Proliferative  diabetic retinopathy ([place]) 02/24/2014  . Diabetes mellitus ([place]) 12/18/2013  . Nuclear cataract of both eyes 12/18/2013    Darrel Hoover, PT 04/18/2018, 3:53 PM  [facility] [address] [place], [place], [address] Phone: [phone]   Fax:  [phone]  Name: Tamara Henry MRN: [id number] Date of Birth: 05-22-1961

## 2018-04-19 ENCOUNTER — Telehealth: Payer: Self-pay | Admitting: Pharmacist

## 2018-04-19 ENCOUNTER — Encounter: Payer: Self-pay | Admitting: Licensed Clinical Social Worker

## 2018-04-19 ENCOUNTER — Other Ambulatory Visit: Payer: Self-pay

## 2018-04-19 NOTE — Patient Outreach (Signed)
[facility]) Care Management  04/19/2018  Micky Overturf Dec 08, 1960 [id number]   Patient was called regarding medication management and to investigate pill packing options. Unfortunately, she did not answer the phone.  HIPAA compliant message was left on the patient's voicemail.  Plan: Call patient back in 3-5 business days. Send patient an unsuccessful contact letter   Elayne Guerin, PharmD, [place] Clinical Pharmacist [phone]

## 2018-04-19 NOTE — Patient Outreach (Signed)
[facility]) Care Management  04/19/2018  Tamara Henry Sep 25, 1960 [id number]   Initial outreach regarding social work referral for food and housing resources.  BSW left voicemail message. Will attempt to reach patient again next week.  Ronn Melena, BSW Social Worker [phone]

## 2018-04-22 ENCOUNTER — Other Ambulatory Visit: Payer: Medicare Other | Admitting: Orthotics

## 2018-04-22 ENCOUNTER — Other Ambulatory Visit: Payer: Self-pay | Admitting: Pharmacist

## 2018-04-22 ENCOUNTER — Other Ambulatory Visit: Payer: Self-pay

## 2018-04-22 NOTE — Patient Outreach (Addendum)
[facility]) Care Management  04/22/2018  Tamara Henry [birth year] [id number]   BSW received return call/voicemail message from patient over the weekend.  BSW attempted to call her back today and had to leave a voicemail message.  Will attempt to reach her again within four business days if no return call.   Addendum: BSW was able to connect with [name] regarding social work referral for housing and food resources.  She reported that she currently lives in [place] but the rent is no longer affordable.  She prefers to live in [place] so BSW mailed housing resources for that area.  BSW also mailed her a list of food pantries and kitchens in the [place] area. BSW will follow up next week to ensure receipt of these resources.      Ronn Melena, BSW Social Worker [phone]

## 2018-04-22 NOTE — Patient Outreach (Signed)
[place] [facility]) Care Management  [place]   04/22/2018  Tamara Henry [birth year] [id number]  Subjective: Patient called me back after I called her last week regarding medication adherence and pill packing options.  HIPAA identifiers were obtained. Patient is a 57 year old female with multiple medical conditions including but not limited to: CKD, CAD, type 2 diaetes, depression, CAD, GERD, hyperlipidemia and morbid obesity.  Patient reported managing her medications on her own but that she often gets confused and interested in getting a pill packing system.    Objective:  hGa1c- 7.8 98/19) LDL 79 TG 132 Scr 35 eGFR 35 ml/min  Encounter Medications: Outpatient Encounter Medications as of 04/22/2018  Medication Sig Note  . acetaminophen (TYLENOL) 650 MG CR tablet Take 1 tablet (650 mg total) by mouth every 8 (eight) hours as needed for pain.   Marland Kitchen albuterol (PROVENTIL HFA;VENTOLIN HFA) 108 (90 Base) MCG/ACT inhaler Inhale 1-2 puffs into the lungs every 6 (six) hours as needed for wheezing or shortness of breath.   Marland Kitchen amLODipine (NORVASC) 10 MG tablet Take 1 tablet (10 mg total) by mouth daily.   Marland Kitchen atorvastatin (LIPITOR) 80 MG tablet Take 1 tablet (80 mg total) by mouth every evening.   Marland Kitchen b complex vitamins tablet Take 1 tablet by mouth daily.   . B-D UF III MINI PEN NEEDLES 31G X 5 MM MISC See admin instructions. Use with insulin injections   . Biotin 5 MG TBDP Take 1 tablet by mouth daily.   . calcipotriene (DOVONOX) 0.005 % cream Apply 1 application topically as needed (itching).    . carvedilol (COREG) 12.5 MG tablet Take 1 tablet (12.5 mg total) by mouth 2 (two) times daily with a meal.   . citalopram (CELEXA) 40 MG tablet Take 1 tablet (20 mg total) by mouth daily *Morning* **emergency fill 08/03/2017** (Patient taking differently: 20 mg. Take 1 tablet (20 mg total) by mouth daily *Morning* **emergency fill 08/03/2017**)   . diphenoxylate-atropine (LOMOTIL)  2.5-0.025 MG tablet Take 1 tablet by mouth 4 (four) times daily as needed for diarrhea or loose stools. Take 1-2 tablets up to 4 times daily.   . ferrous sulfate 325 (65 FE) MG tablet Take 1 tablet (325 mg total) by mouth daily with breakfast.   . Flaxseed, Linseed, (FLAX SEED OIL) 1000 MG CAPS Take 1,000 mg by mouth daily.   . folic acid (FOLVITE) 458 MCG tablet Take 400 mcg by mouth daily.   . furosemide (LASIX) 40 MG tablet Take 1 tablet (40 mg total) by mouth daily.   Marland Kitchen gabapentin (NEURONTIN) 300 MG capsule Take 3 capsules EVERY MORNING, Take 2 capsules at lunch, and take 3 capsules at bedtime   . glucose blood (ONETOUCH VERIO) test strip Check blood sugar 6 x daily   . guaiFENesin (MUCINEX) 600 MG 12 hr tablet Take 2 tablets (1,200 mg total) 2 (two) times daily by mouth. (Patient taking differently: Take 1,200 mg by mouth 2 (two) times daily as needed for cough or to loosen phlegm. )   . ibuprofen (ADVIL,MOTRIN) 600 MG tablet Take 1 tablet (600 mg total) by mouth every 6 (six) hours as needed.   . Insulin Glargine (LANTUS SOLOSTAR) 100 UNIT/ML Solostar Pen Inject 50 Units into the skin daily at 10 pm.   . insulin lispro (HUMALOG KWIKPEN) 100 UNIT/ML KiwkPen Inject 0.05 mLs (5 Units total) into the skin 2 (two) times daily with a meal.   . KRILL OIL PO Take 1 capsule  by mouth daily.   Marland Kitchen loperamide (IMODIUM A-D) 2 MG tablet Take 4 mg by mouth as needed for diarrhea or loose stools.   . Melatonin 5 MG CAPS Take 5 mg by mouth at bedtime.   . mometasone (NASONEX) 50 MCG/ACT nasal spray Place 2 sprays into the nose daily as needed for congestion.   . MULTIPLE VITAMIN PO Take 1 tablet by mouth daily.   . Multiple Vitamins-Minerals (MULTIVITAMIN) tablet Take 1 tablet by mouth daily.   Marland Kitchen olmesartan (BENICAR) 20 MG tablet Take 1 tablet (20 mg total) by mouth daily.   Marland Kitchen omeprazole (PRILOSEC) 40 MG capsule Take 1 capsule (40 mg total) by mouth daily.   . Oral Medication Containers (PILL CASE) MISC 1  Units by Does not apply route daily.   . pantoprazole (PROTONIX) 40 MG tablet Take 1 tablet (40 mg total) by mouth 2 (two) times daily before a meal.   . Polyvinyl Alcohol (LUBRICANT DROPS OP) Place 1 drop into both eyes daily as needed (dry eyes).   . Probiotic Product (ALIGN) 4 MG CAPS Take 1 capsule (4 mg total) by mouth daily.   Marland Kitchen tiZANidine (ZANAFLEX) 4 MG tablet TAKE 1 TABLET BY MOUTH EVERY 6 HOURS AS NEEDED FOR MUSCLE SPASM   . vitamin C (ASCORBIC ACID) 500 MG tablet Take 500 mg by mouth daily.   [name] (PREPARATION H) 50 % PADS Apply 1 application 2 (two) times daily topically. (Patient taking differently: Apply 1 application topically 2 (two) times daily as needed (itching). )   . aspirin 81 MG EC tablet Take 1 tablet (81 mg total) by mouth daily. (Patient not taking: Reported on 04/22/2018)   . [DISCONTINUED] clindamycin (CLEOCIN) 300 MG capsule Take 1 capsule (300 mg total) by mouth 3 (three) times daily. 01/24/2018: For 30 days  . [DISCONTINUED] ezetimibe (ZETIA) 10 MG tablet Take 1 tablet (10 mg total) by mouth daily. (Patient not taking: Reported on 04/22/2018)    No facility-administered encounter medications on file as of 04/22/2018.     Functional Status: In your present state of health, do you have any difficulty performing the following activities: 01/31/2018 11/30/2017  Hearing? N N  Vision? N N  Difficulty concentrating or making decisions? N N  Walking or climbing stairs? N N  Dressing or bathing? N N  Doing errands, shopping? - N  Some recent data might be hidden    Fall/Depression Screening: Fall Risk  03/25/2018 01/30/2018 01/16/2018  Falls in the past year? Yes No No  Number falls in past yr: 2 or more - -  Comment - - -  Injury with Fall? Yes - -  Comment - - -  Risk Factor Category  High Fall Risk - -  Risk for fall due to : - - -  Follow up Falls evaluation completed;Education provided;Falls prevention discussed - -   PHQ 2/9 Scores 04/18/2018 04/17/2018  03/27/2018 03/25/2018 01/30/2018 01/16/2018 01/16/2018  PHQ - 2 Score 0 0 0 0 0 2 0  PHQ- 9 Score - - 0 - - 8 -      Assessment: Patients medications were reviewed via telephone.   Drugs sorted by system:  Neurologic/Psychologic: Citalopram, Gabapentin,   Cardiovascular: Amlodipine, Aspirin, Atorvastatin, Carvedilol, Furosemide, Krill Oil, Olmesartan,   Pulmonary/Allergy: Albuterol, Guaifenisin, Mometasone,   Gastrointestinal: Diphenoxylate-atropine, Loperamide, Omeprazole, Pantoprazole, Probiotic, Witch Hazel Pads (Preparation H)  Endocrine: Lantus, Humalog  Topical: Calcipotriene   Pain: Acetaminophen, Ibuprofen, Tizanidine,   Vitamins/Minerals: Ascorbic Acid, B Complex,  Biotin, Ferrous Sulfate, Flaxseed, Folic Acid, Melatonin, Multiple Vitamin,   Miscellaneous: Polyvinyl Alcohol eye drops  Medication Review Findings:  Dose too high:    Patient's eGFR in March was 35 ml/min. She is taking  2400 mg of Gabapentin.  The manufacturer's maximum recommended dose is 200-700 mg twice daily.  Patient may also need a new BMP drawn.  Pantoprazole-patient reported taking pantoprazole 80m twice daily.  Given the patient's decreased renal function, it may be prudent to decrease the dose to 422mdaily unless she has a history of GI bleed or other condition that requires twice daily PPI dosing.  Therapeutic duplication- Omeprazole/pantoprazole-patient does not need two PPIs. A note will be sent to her provider about discontinuing one of the PPIs.  Ibuprofen may need to be discontinued since the patient is also on furosemide and has an elevated Scr to preserve renal function.   Patient appears to have both Medicare and Medicaid--as such she does not qualify for patient assistance programs.  Plan/Interventions  -researched local pharmacies that provide pill packing -Called [facility]-Will reach out to the patient's provider to review her medication list and send all  chronic medications to [facility] up with the patient in 3-5 days.  KaElayne GuerinPharmD, BCDaylinical Pharmacist [phone]

## 2018-04-24 ENCOUNTER — Ambulatory Visit: Payer: Medicare Other | Admitting: Physical Therapy

## 2018-04-24 DIAGNOSIS — R293 Abnormal posture: Secondary | ICD-10-CM | POA: Diagnosis not present

## 2018-04-24 DIAGNOSIS — M545 Low back pain: Principal | ICD-10-CM

## 2018-04-24 DIAGNOSIS — G8929 Other chronic pain: Secondary | ICD-10-CM

## 2018-04-24 DIAGNOSIS — M62830 Muscle spasm of back: Secondary | ICD-10-CM | POA: Diagnosis not present

## 2018-04-24 NOTE — Therapy (Signed)
[place], [place], [address] Phone: [phone]   Fax:  [phone]  Physical Therapy Treatment  Patient Details  Name: Tamara Henry MRN: [id number] Date of Birth: 26-Feb-1961 Referring Provider: Howie Ill, MD   Encounter Date: 04/24/2018  PT End of Session - 04/24/18 0835    Visit Number  2    Number of Visits  12    Date for PT Re-Evaluation  05/31/18    PT Start Time  0800    PT Stop Time  0845   last 15 min on heat   PT Time Calculation (min)  45 min    Activity Tolerance  Patient tolerated treatment well;Patient limited by pain    Behavior During Therapy  [facility] for tasks assessed/performed       Past Medical History:  Diagnosis Date  . Acid reflux   . Allergy    seasonal  . Anxiety   . Arthritis    lower back, feet  . Asthma    albuterol inhaler - rarely uses - only needs when she gets sick  . Back pain   . Bilateral pulmonary embolism ([place]) 05/2016  . Bipolar disorder ([place])   . CAD in native artery    S/P NSTEMI with cath showing severe 3 vessel ASCAD s/p CABGx4 07/28/16.  . Cataracts, bilateral   . CKD (chronic kidney disease), stage III (HCC)    borderline CKD II-III  . Cocaine abuse ([place])    In remission. Stopped using in early 2000's  . Depression   . Diabetes mellitus ([place])    Type II  . GERD (gastroesophageal reflux disease)   . Glaucoma   . High cholesterol   . History of blood transfusion 2017  . History of pulmonary embolus (PE)   . 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 Hx of chest tube placement right   . Hypertension   . Morbid obesity ([place])   . Myocardial infarction ([place]) 2017  . Neuromuscular disorder (HCC)    neuropathy in hands and feet  . Neuropathy    hands and feet  . Pneumonia   . Postoperative anemia 07/2016  . PTSD (post-traumatic stress disorder)   . Sleep apnea    Does not use cpap  . Stroke [facility]) 2005   no residual  . SVD (spontaneous vaginal delivery)     x 4    Past Surgical History:  Procedure Laterality Date  . CARDIAC CATHETERIZATION N/A 07/25/2016   Procedure: Left Heart Cath and Coronary Angiography;  Surgeon: Peter M Martinique, MD;  Location: [place] [facility];  Service: Cardiovascular;  Laterality: N/A;  . COLONOSCOPY    . COLONOSCOPY W/ POLYPECTOMY    . CORONARY ARTERY BYPASS GRAFT N/A 07/28/2016   Procedure: CORONARY ARTERY BYPASS GRAFTING (CABG)x4 using left internal mammary and endoscopic harvest of right greater saphenous vein;  Surgeon: Ivin Poot, MD;  Location: [place];  Service: Open Heart Surgery;  Laterality: N/A;  . DENTAL SURGERY     to removed broken teeth  . HYSTEROSCOPY W/D&C N/A 01/31/2018   Procedure: DILATATION AND CURETTAGE /HYSTEROSCOPY;  Surgeon: Woodroe Mode, MD;  Location: [place] [facility];  Service: Gynecology;  Laterality: N/A;  . POLYPECTOMY    . PULMONARY EMBOLISM SURGERY  2017   [facility]  . SHOULDER ARTHROSCOPY WITH ROTATOR CUFF REPAIR Left 02/19/2017   Procedure: LEFT SHOULDER ARTHROSCOPY with debridement andROTATOR CUFF REPAIR;  Surgeon: [name], MD;  Location: [place];  Service:  Orthopedics;  Laterality: Left;  . TEE WITHOUT CARDIOVERSION N/A 07/28/2016   Procedure: TRANSESOPHAGEAL ECHOCARDIOGRAM (TEE);  Surgeon: Ivin Poot, MD;  Location: [place];  Service: Open Heart Surgery;  Laterality: N/A;  . TONSILLECTOMY    . TUBAL LIGATION      There were no vitals filed for this visit.  Subjective Assessment - 04/24/18 0829    Subjective  Pt relays no pain upon arrival but she did say her back had pain and spasms last night with walking. She relays compliance with HEP    Currently in Pain?  No/denies                       [facility] Adult PT Treatment/Exercise - 04/24/18 0001      Exercises   Exercises  Lumbar      Lumbar Exercises: Stretches   Single Knee to Chest Stretch  2 reps;Right;Left;30 seconds    Lower Trunk Rotation  3 reps;10 seconds    Piriformis Stretch  2  reps;Left;Right;20 seconds    Other Lumbar Stretch Exercise  quadratus stretc to Lt 10 sec x 3    Other Lumbar Stretch Exercise  sitting flexion 10 sec X10      Lumbar Exercises: Supine   Pelvic Tilt  5 seconds;20 reps    Other Supine Lumbar Exercises  TAC with march X15 bilat      Modalities   Modalities  Moist Heat      Moist Heat Therapy   Number Minutes Moist Heat  15 Minutes    Moist Heat Location  Lumbar Spine             PT Education - 04/24/18 0834    Education Details  HEP review and technique/cuing for exercises and stretches    Person(s) Educated  Patient    Methods  Explanation;Demonstration;Verbal cues    Comprehension  Verbalized understanding;Returned demonstration;Need further instruction       PT Short Term Goals - 04/24/18 0839      PT SHORT TERM GOAL #1   Title  She will be independent with initial HEP    Time  3    Period  Weeks    Status  On-going      PT SHORT TERM GOAL #2   Title  She will report 30% decr in intensity of episodes of LBP    Time  3    Period  Weeks    Status  Partially Met        PT Long Term Goals - 04/18/18 1517      PT LONG TERM GOAL #1   Title  She will be independent with all HEP issued    Time  6    Period  Weeks    Status  New      PT LONG TERM GOAL #2   Title  She will report episodes of LBP dec to once over 2 weeks     Time  6    Period  Weeks    Status  New      PT LONG TERM GOAL #3   Title  When having episode of pain max pain 3/10    Time  6    Period  Weeks    Status  New      PT LONG TERM GOAL #4   Title  She will demo understaning of good standing and sitting psoture    Time  6    Period  Weeks    Status  New            Plan - 04/24/18 0836    Clinical Impression Statement  Pt was able to progress her lumbar stretching program and core stabilization program with good tolerance. She did have some pain with low trunk rotations and was instructed to stay in pain free ROM. She wore  dress today so MT was not able to be performed in order to keep patient modesty/privacy. She did recieved MHP at conclusion of session as she relays this really helps her back. She will continue to benefit from PT for stretching and core stabilization.    Rehab Potential  Good    PT Frequency  2x / week    PT Duration  6 weeks    PT Treatment/Interventions  Manual techniques;Taping;Dry needling;Patient/family education;Therapeutic exercise;Therapeutic activities;Moist Heat    PT Next Visit Plan  stetching, core, MT for spine and hip PRN    Consulted and Agree with Plan of Care  Patient       Patient will benefit from skilled therapeutic intervention in order to improve the following deficits and impairments:  Pain, Increased muscle spasms, Postural dysfunction, Decreased range of motion, Decreased strength  Visit Diagnosis: Chronic right-sided low back pain without sciatica  Abnormal posture  Muscle spasm of back     Problem List Patient Active Problem List   Diagnosis Date Noted  . Left hand pain 04/18/2018  . Sprain of ankle 03/25/2018  . Pain in lower jaw 01/16/2018  . Healthcare maintenance 12/11/2017  . Vaginal bleeding 10/09/2017  . Postmenopausal vaginal bleeding 10/04/2017  . Cough 09/28/2017  . Chronic pansinusitis 09/28/2017  . Laryngopharyngeal reflux (LPR) 08/28/2017  . Sensorineural hearing loss (SNHL), bilateral 08/28/2017  . Diarrhea 08/04/2017  . Arm pain, anterior, right 06/17/2017  . OSA on CPAP 05/03/2017  . Morbid obesity due to excess calories ([place]) 05/03/2017  . Microscopic hematuria 03/27/2017  . Impingement syndrome of left shoulder 02/19/2017  . Hypersomnia 12/12/2016  . REM behavioral disorder 12/12/2016  . Asthma 12/12/2016  . Neck pain 11/17/2016  . CKD (chronic kidney disease), stage III ([place]) 11/02/2016  . CAD (coronary artery disease), native coronary artery 10/26/2016  . Poor social situation 10/13/2016  . Leg pain 08/30/2016  . Back  pain 08/30/2016  . 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 GERD (gastroesophageal reflux disease) 06/09/2016  . Obesity (BMI 30-39.9) 06/09/2016  . Facial swelling 05/21/2016  . Hyperlipidemia 05/17/2016  . Depression 05/17/2016  . Diabetic peripheral neuropathy ([place]) 05/17/2016  . Neuritis of upper extremity, C7 01/15/2015  . Hypertension 01/14/2015  . Numbness and tingling of right arm 01/14/2015  . Proliferative diabetic retinopathy ([place]) 02/24/2014  . Diabetes mellitus ([place]) 12/18/2013  . Nuclear cataract of both eyes 12/18/2013    Debbe Odea, PT, DPT 04/24/2018, 8:42 AM  [facility] [address] [place], [place], [address] Phone: [phone]   Fax:  [phone]  Name: Tamara Henry MRN: [id number] Date of Birth: 1960-12-18

## 2018-04-25 ENCOUNTER — Other Ambulatory Visit: Payer: Self-pay | Admitting: Family Medicine

## 2018-04-25 ENCOUNTER — Other Ambulatory Visit: Payer: Self-pay | Admitting: Pharmacist

## 2018-04-25 ENCOUNTER — Ambulatory Visit: Payer: Self-pay

## 2018-04-25 ENCOUNTER — Ambulatory Visit: Payer: Self-pay | Admitting: Pharmacist

## 2018-04-25 ENCOUNTER — Telehealth: Payer: Self-pay

## 2018-04-25 ENCOUNTER — Telehealth: Payer: Self-pay | Admitting: Family Medicine

## 2018-04-25 DIAGNOSIS — M79642 Pain in left hand: Secondary | ICD-10-CM

## 2018-04-25 MED ORDER — ATORVASTATIN CALCIUM 80 MG PO TABS: 80.0000 mg | ORAL_TABLET | Freq: Every evening | ORAL | 3 refills | Status: DC

## 2018-04-25 MED ORDER — CITALOPRAM HYDROBROMIDE 40 MG PO TABS
ORAL_TABLET | ORAL | 3 refills | Status: DC
Start: 2018-04-25 — End: 2019-06-16

## 2018-04-25 MED ORDER — PILL CASE MISC: 1.0000 [IU] | Freq: Every day | 0 refills | Status: DC

## 2018-04-25 MED ORDER — ALIGN 4 MG PO CAPS: 1.0000 | ORAL_CAPSULE | Freq: Every day | ORAL | 2 refills | Status: AC

## 2018-04-25 MED ORDER — IBUPROFEN 600 MG PO TABS: 600.0000 mg | ORAL_TABLET | Freq: Four times a day (QID) | ORAL | 0 refills | Status: DC | PRN

## 2018-04-25 MED ORDER — OMEPRAZOLE 40 MG PO CPDR: 40.0000 mg | DELAYED_RELEASE_CAPSULE | Freq: Every day | ORAL | 2 refills | Status: DC

## 2018-04-25 MED ORDER — THERA VITAL M PO TABS: 1.0000 | ORAL_TABLET | Freq: Every day | ORAL | 5 refills | Status: AC

## 2018-04-25 MED ORDER — TIZANIDINE HCL 4 MG PO TABS: ORAL_TABLET | ORAL | 0 refills | Status: DC

## 2018-04-25 MED ORDER — CARVEDILOL 12.5 MG PO TABS: 12.5000 mg | ORAL_TABLET | Freq: Two times a day (BID) | ORAL | 3 refills | Status: DC

## 2018-04-25 MED ORDER — ASPIRIN 81 MG PO TBEC: 81.0000 mg | DELAYED_RELEASE_TABLET | Freq: Every day | ORAL | 12 refills | Status: AC

## 2018-04-25 MED ORDER — INSULIN GLARGINE 100 UNIT/ML SOLOSTAR PEN: 50.0000 [IU] | PEN_INJECTOR | Freq: Every day | SUBCUTANEOUS | 99 refills | Status: DC

## 2018-04-25 MED ORDER — ALBUTEROL SULFATE HFA 108 (90 BASE) MCG/ACT IN AERS: 1.0000 | INHALATION_SPRAY | Freq: Four times a day (QID) | RESPIRATORY_TRACT | 2 refills | Status: DC | PRN

## 2018-04-25 MED ORDER — CALCIPOTRIENE 0.005 % EX CREA
1.0000 | TOPICAL_CREAM | CUTANEOUS | 0 refills | Status: DC | PRN
Start: 2018-04-25 — End: 2018-06-14

## 2018-04-25 MED ORDER — VITAMIN C 500 MG PO TABS: 500.0000 mg | ORAL_TABLET | Freq: Every day | ORAL | 3 refills | Status: AC

## 2018-04-25 MED ORDER — FERROUS SULFATE 325 (65 FE) MG PO TABS: 325.0000 mg | ORAL_TABLET | Freq: Every day | ORAL | 3 refills | Status: AC

## 2018-04-25 MED ORDER — MELATONIN 5 MG PO CAPS: 5.0000 mg | ORAL_CAPSULE | Freq: Every day | ORAL | 3 refills | Status: AC

## 2018-04-25 MED ORDER — BIOTIN 5 MG PO TBDP: 1.0000 | ORAL_TABLET | Freq: Every day | ORAL | 11 refills | Status: AC

## 2018-04-25 MED ORDER — AMLODIPINE BESYLATE 10 MG PO TABS: 10.0000 mg | ORAL_TABLET | Freq: Every day | ORAL | 3 refills | Status: DC

## 2018-04-25 MED ORDER — GLUCOSE BLOOD VI STRP
ORAL_STRIP | 3 refills | Status: DC
Start: 2018-04-25 — End: 2018-08-20

## 2018-04-25 MED ORDER — PREPARATION H 50 % EX PADS: 1.0000 "application " | MEDICATED_PAD | Freq: Two times a day (BID) | CUTANEOUS | 3 refills | Status: AC | PRN

## 2018-04-25 MED ORDER — OLMESARTAN MEDOXOMIL 20 MG PO TABS: 20.0000 mg | ORAL_TABLET | Freq: Every day | ORAL | Status: DC

## 2018-04-25 MED ORDER — DIPHENOXYLATE-ATROPINE 2.5-0.025 MG PO TABS: 1.0000 | ORAL_TABLET | Freq: Four times a day (QID) | ORAL | 0 refills | Status: DC | PRN

## 2018-04-25 MED ORDER — FUROSEMIDE 40 MG PO TABS: 40.0000 mg | ORAL_TABLET | Freq: Every day | ORAL | 3 refills | Status: DC

## 2018-04-25 MED ORDER — MOMETASONE FUROATE 50 MCG/ACT NA SUSP: 2.0000 | Freq: Every day | NASAL | 0 refills | Status: AC | PRN

## 2018-04-25 MED ORDER — INSULIN LISPRO 100 UNIT/ML (KWIKPEN): 5.0000 [IU] | PEN_INJECTOR | Freq: Two times a day (BID) | SUBCUTANEOUS | 1 refills | Status: AC

## 2018-04-25 MED ORDER — ACETAMINOPHEN ER 650 MG PO TBCR: 650.0000 mg | EXTENDED_RELEASE_TABLET | Freq: Three times a day (TID) | ORAL | 1 refills | Status: DC | PRN

## 2018-04-25 MED ORDER — FOLIC ACID 400 MCG PO TABS: 400.0000 ug | ORAL_TABLET | Freq: Every day | ORAL | 3 refills | Status: AC

## 2018-04-25 MED ORDER — GABAPENTIN 300 MG PO CAPS: ORAL_CAPSULE | ORAL | 2 refills | Status: DC

## 2018-04-25 NOTE — Telephone Encounter (Signed)
Referral placed to hand surgery.

## 2018-04-25 NOTE — Telephone Encounter (Signed)
Denyse Amass, pharmacist with [facility], called on behalf of patient. Patient would like to begin getting medications in pill pack form. Her medications will have to be sent to [facility] to accomplish this. Alwyn Ren has two questions based on patient's medication list:  1. Gabapentin dose seems to be high for patient based on last GFR.  2. Patient states she is taking BOTH Omeprazole and Pantoprazole.  Please see Katina's medication management phone note from 04/22/18 for details.   You can respond to [place] through Epic or her number is [phone].  Once prescriptions are sent to [facility] will work with them to get them into pill packs.  Danley Danker, RN [facility] RN)

## 2018-04-25 NOTE — Telephone Encounter (Signed)
Pt informed of below. Zimmerman Rumple, April D, CMA  

## 2018-04-25 NOTE — Patient Outreach (Signed)
[facility]) Care Management  04/25/2018  Tamara Henry 1961-02-18 [id number]   Dr. Maudie Flakes office was called today to inquire about having the patient's prescriptions switched to [place] and to discuss the following drug therapy problems:  Patient's eGFR in March was 35 ml/min. She is taking  2400 mg of Gabapentin.  The manufacturer's maximum recommended dose is 200-700 mg twice daily.  Patient may also need a new BMP drawn.  Pantoprazole-patient reported taking pantoprazole 45m twice daily.  Given the patient's decreased renal function, it may be prudent to decrease the dose to 423mdaily unless she has a history of GI bleed or other condition that requires twice daily PPI dosing.  Therapeutic duplication- Omeprazole/pantoprazole-patient does not need two PPIs. A note will be sent to her provider about discontinuing one of the PPIs.  Plan: Awaiting a call back or inbasket message.   KaElayne GuerinPharmD, BCWetumpkalinical Pharmacist [phone]

## 2018-04-25 NOTE — Telephone Encounter (Signed)
Is calling and would like a referral to see a hand specialist. She said her hand is not getting any better. Tamara Henry

## 2018-04-26 ENCOUNTER — Other Ambulatory Visit: Payer: Self-pay | Admitting: Pharmacist

## 2018-04-26 NOTE — Patient Outreach (Signed)
[place] [facility]) Care Management  04/26/2018  Tamara Henry 03-24-1961 [id number]   Patient was called to follow up on medication pill packing. HIPAA identifiers were obtained.  Dr. Shan Levans sent a message to clarify the patient's medication list and agreed to send the patient's active medications to [facility].  Dr. Shan Levans also clarified the patient's CrCl as 65 ml/min and will not need to change the gabapentin dose.  [place] was called. The pharmacist reported every medication on the patient's profile was called in with the exception of:  Aspirin, Krill oil, loperamide, b complex, lomotil, vitamin c, and flaxseed.  Patient reported she is able to order OTC medications at no charge through her insurance.    [place] mentioned that the patient recently had her medications filled at [facility] and would need to wait until the end of the month to ger her pill packs done.  Patient confirmed she has enough medication to last her until the end of the month.    Prescription meds that will be in the pill pack: Amlodipine,  Carvedilol Citalopram Furosemide Gabapentin Omeprazole Olemsartan   Olmesartan was not called in--will request Diphenoxylate-atropine-not called in --PRN med  Tizanidine & Ibuprofen- called in but are PRN (will not be put in the pack)      Patient takes several OTC products that she gets from a "catalog": Vitamin C Probiotic Multiple vitamin Melatonin Loperamide Krill oil Biotin Flaxseed Guaifenesin Ferrous Sulfate Acetaminophen  The pharmacist at [facility] said the patient could bring her OTC products to the pharmacy and he will put them in a pack.  It is ultimately the patient's decision. However, outside of Asprin, it may be easier to manage if the OTC products are not put in the pill pack.   The pill packs are scheduled to be put together May 11, 2018.    Plan: I will reach out to the patient and  [place] before the packs are scheduled to be assembled.  A home visit will be scheduled to inspect the pill packs once they are delivered.   Elayne Guerin, PharmD, [place] Clinical Pharmacist [phone]

## 2018-04-29 ENCOUNTER — Other Ambulatory Visit: Payer: Medicare Other | Admitting: Family Medicine

## 2018-04-29 ENCOUNTER — Other Ambulatory Visit: Payer: Self-pay

## 2018-04-29 NOTE — Patient Outreach (Signed)
[place] [facility]) Care Management  04/29/2018  Yashvi Jasinski [birth year] [id number]   Follow up call to Ms. Metzgar to ensure receipt of housing and food resources mailed.  Ms. Slatten confirmed receipt and denied the need for additional resources at this time.  BSW is closing case due to no other social work needs being identified.    Ronn Melena, BSW Social Worker [phone]

## 2018-04-30 ENCOUNTER — Observation Stay (HOSPITAL_COMMUNITY)
Admission: EM | Admit: 2018-04-30 | Discharge: 2018-05-03 | Disposition: A | Discharge status: Home / Self Care | Payer: Medicare Other | Attending: Family Medicine | Admitting: Family Medicine

## 2018-04-30 ENCOUNTER — Ambulatory Visit: Payer: Self-pay

## 2018-04-30 ENCOUNTER — Encounter (HOSPITAL_COMMUNITY): Payer: Self-pay | Admitting: *Deleted

## 2018-04-30 ENCOUNTER — Ambulatory Visit: Payer: Medicare Other | Admitting: Orthotics

## 2018-04-30 ENCOUNTER — Inpatient Hospital Stay (HOSPITAL_COMMUNITY): Payer: Medicare Other

## 2018-04-30 ENCOUNTER — Other Ambulatory Visit: Payer: Self-pay

## 2018-04-30 DIAGNOSIS — Z91040 Latex allergy status: Secondary | ICD-10-CM | POA: Insufficient documentation

## 2018-04-30 DIAGNOSIS — I129 Hypertensive chronic kidney disease with stage 1 through stage 4 chronic kidney disease, or unspecified chronic kidney disease: Secondary | ICD-10-CM | POA: Diagnosis not present

## 2018-04-30 DIAGNOSIS — M19071 Primary osteoarthritis, right ankle and foot: Secondary | ICD-10-CM | POA: Insufficient documentation

## 2018-04-30 DIAGNOSIS — K7689 Other specified diseases of liver: Secondary | ICD-10-CM | POA: Insufficient documentation

## 2018-04-30 DIAGNOSIS — Z951 Presence of aortocoronary bypass graft: Secondary | ICD-10-CM | POA: Insufficient documentation

## 2018-04-30 DIAGNOSIS — M19072 Primary osteoarthritis, left ankle and foot: Secondary | ICD-10-CM | POA: Diagnosis not present

## 2018-04-30 DIAGNOSIS — Z6839 Body mass index (BMI) 39.0-39.9, adult: Secondary | ICD-10-CM | POA: Insufficient documentation

## 2018-04-30 DIAGNOSIS — F329 Major depressive disorder, single episode, unspecified: Secondary | ICD-10-CM | POA: Insufficient documentation

## 2018-04-30 DIAGNOSIS — H04123 Dry eye syndrome of bilateral lacrimal glands: Secondary | ICD-10-CM | POA: Insufficient documentation

## 2018-04-30 DIAGNOSIS — Z794 Long term (current) use of insulin: Secondary | ICD-10-CM | POA: Insufficient documentation

## 2018-04-30 DIAGNOSIS — Z8249 Family history of ischemic heart disease and other diseases of the circulatory system: Secondary | ICD-10-CM | POA: Insufficient documentation

## 2018-04-30 DIAGNOSIS — R000 Tachycardia, unspecified: Secondary | ICD-10-CM | POA: Diagnosis not present

## 2018-04-30 DIAGNOSIS — E739 Lactose intolerance, unspecified: Secondary | ICD-10-CM | POA: Diagnosis not present

## 2018-04-30 DIAGNOSIS — R1011 Right upper quadrant pain: Secondary | ICD-10-CM

## 2018-04-30 DIAGNOSIS — N179 Acute kidney failure, unspecified: Principal | ICD-10-CM | POA: Insufficient documentation

## 2018-04-30 DIAGNOSIS — R059 Cough, unspecified: Secondary | ICD-10-CM

## 2018-04-30 DIAGNOSIS — R740 Nonspecific elevation of levels of transaminase and lactic acid dehydrogenase [LDH]: Secondary | ICD-10-CM | POA: Insufficient documentation

## 2018-04-30 DIAGNOSIS — R7401 Elevation of levels of liver transaminase levels: Secondary | ICD-10-CM | POA: Diagnosis present

## 2018-04-30 DIAGNOSIS — Z86711 Personal history of pulmonary embolism: Secondary | ICD-10-CM | POA: Insufficient documentation

## 2018-04-30 DIAGNOSIS — F1911 Other psychoactive substance abuse, in remission: Secondary | ICD-10-CM | POA: Insufficient documentation

## 2018-04-30 DIAGNOSIS — Z888 Allergy status to other drugs, medicaments and biological substances status: Secondary | ICD-10-CM | POA: Insufficient documentation

## 2018-04-30 DIAGNOSIS — R945 Abnormal results of liver function studies: Secondary | ICD-10-CM

## 2018-04-30 DIAGNOSIS — E785 Hyperlipidemia, unspecified: Secondary | ICD-10-CM | POA: Insufficient documentation

## 2018-04-30 DIAGNOSIS — E113513 Type 2 diabetes mellitus with proliferative diabetic retinopathy with macular edema, bilateral: Secondary | ICD-10-CM | POA: Diagnosis not present

## 2018-04-30 DIAGNOSIS — R1084 Generalized abdominal pain: Secondary | ICD-10-CM | POA: Diagnosis not present

## 2018-04-30 DIAGNOSIS — Z87891 Personal history of nicotine dependence: Secondary | ICD-10-CM | POA: Insufficient documentation

## 2018-04-30 DIAGNOSIS — N189 Chronic kidney disease, unspecified: Secondary | ICD-10-CM | POA: Diagnosis not present

## 2018-04-30 DIAGNOSIS — E1142 Type 2 diabetes mellitus with diabetic polyneuropathy: Secondary | ICD-10-CM

## 2018-04-30 DIAGNOSIS — N183 Chronic kidney disease, stage 3 (moderate): Secondary | ICD-10-CM | POA: Insufficient documentation

## 2018-04-30 DIAGNOSIS — G4733 Obstructive sleep apnea (adult) (pediatric): Secondary | ICD-10-CM | POA: Diagnosis not present

## 2018-04-30 DIAGNOSIS — M205X1 Other deformities of toe(s) (acquired), right foot: Secondary | ICD-10-CM

## 2018-04-30 DIAGNOSIS — Z7982 Long term (current) use of aspirin: Secondary | ICD-10-CM | POA: Insufficient documentation

## 2018-04-30 DIAGNOSIS — K219 Gastro-esophageal reflux disease without esophagitis: Secondary | ICD-10-CM | POA: Diagnosis not present

## 2018-04-30 DIAGNOSIS — R197 Diarrhea, unspecified: Secondary | ICD-10-CM | POA: Diagnosis not present

## 2018-04-30 DIAGNOSIS — J45909 Unspecified asthma, uncomplicated: Secondary | ICD-10-CM | POA: Insufficient documentation

## 2018-04-30 DIAGNOSIS — F4310 Post-traumatic stress disorder, unspecified: Secondary | ICD-10-CM | POA: Insufficient documentation

## 2018-04-30 DIAGNOSIS — R748 Abnormal levels of other serum enzymes: Secondary | ICD-10-CM | POA: Diagnosis not present

## 2018-04-30 DIAGNOSIS — I252 Old myocardial infarction: Secondary | ICD-10-CM | POA: Diagnosis not present

## 2018-04-30 DIAGNOSIS — M2021 Hallux rigidus, right foot: Secondary | ICD-10-CM

## 2018-04-30 DIAGNOSIS — R1013 Epigastric pain: Secondary | ICD-10-CM | POA: Diagnosis not present

## 2018-04-30 DIAGNOSIS — R05 Cough: Secondary | ICD-10-CM

## 2018-04-30 DIAGNOSIS — E1149 Type 2 diabetes mellitus with other diabetic neurological complication: Secondary | ICD-10-CM

## 2018-04-30 DIAGNOSIS — E1122 Type 2 diabetes mellitus with diabetic chronic kidney disease: Secondary | ICD-10-CM | POA: Insufficient documentation

## 2018-04-30 DIAGNOSIS — R7989 Other specified abnormal findings of blood chemistry: Secondary | ICD-10-CM

## 2018-04-30 DIAGNOSIS — M47816 Spondylosis without myelopathy or radiculopathy, lumbar region: Secondary | ICD-10-CM | POA: Diagnosis not present

## 2018-04-30 DIAGNOSIS — Z880 Allergy status to penicillin: Secondary | ICD-10-CM | POA: Insufficient documentation

## 2018-04-30 DIAGNOSIS — I2510 Atherosclerotic heart disease of native coronary artery without angina pectoris: Secondary | ICD-10-CM | POA: Diagnosis not present

## 2018-04-30 DIAGNOSIS — Z882 Allergy status to sulfonamides status: Secondary | ICD-10-CM | POA: Insufficient documentation

## 2018-04-30 DIAGNOSIS — Z79899 Other long term (current) drug therapy: Secondary | ICD-10-CM | POA: Insufficient documentation

## 2018-04-30 DIAGNOSIS — R1010 Upper abdominal pain, unspecified: Secondary | ICD-10-CM | POA: Diagnosis not present

## 2018-04-30 DIAGNOSIS — M205X2 Other deformities of toe(s) (acquired), left foot: Secondary | ICD-10-CM

## 2018-04-30 LAB — COMPREHENSIVE METABOLIC PANEL
ALT: 263 U/L — AB (ref 0–44)
ANION GAP: 9 (ref 5–15)
AST: 300 U/L — ABNORMAL HIGH (ref 15–41)
Albumin: 3.1 g/dL — ABNORMAL LOW (ref 3.5–5.0)
Alkaline Phosphatase: 188 U/L — ABNORMAL HIGH (ref 38–126)
BUN: 29 mg/dL — ABNORMAL HIGH (ref 6–20)
CHLORIDE: 104 mmol/L (ref 98–111)
CO2: 22 mmol/L (ref 22–32)
Calcium: 8.4 mg/dL — ABNORMAL LOW (ref 8.9–10.3)
Creatinine, Ser: 2.21 mg/dL — ABNORMAL HIGH (ref 0.44–1.00)
GFR, EST AFRICAN AMERICAN: 28 mL/min — AB (ref >60)
GFR, EST NON AFRICAN AMERICAN: 24 mL/min — AB (ref >60)
Glucose, Bld: 190 mg/dL — ABNORMAL HIGH (ref 70–99)
POTASSIUM: 4.2 mmol/L (ref 3.5–5.1)
Sodium: 135 mmol/L (ref 135–145)
Total Bilirubin: 1.2 mg/dL (ref 0.3–1.2)
Total Protein: 6.6 g/dL (ref 6.5–8.1)

## 2018-04-30 LAB — PROTIME-INR
INR: 1.01
PROTHROMBIN TIME: 13.2 s (ref 11.4–15.2)

## 2018-04-30 LAB — CBC
HCT: 41.3 % (ref 36.0–46.0)
HEMOGLOBIN: 12.9 g/dL (ref 12.0–15.0)
MCH: 29.9 pg (ref 26.0–34.0)
MCHC: 31.2 g/dL (ref 30.0–36.0)
MCV: 95.6 fL (ref 78.0–100.0)
PLATELETS: 194 10*3/uL (ref 150–400)
RBC: 4.32 MIL/uL (ref 3.87–5.11)
RDW: 12.7 % (ref 11.5–15.5)
WBC: 8.5 10*3/uL (ref 4.0–10.5)

## 2018-04-30 LAB — ACETAMINOPHEN LEVEL

## 2018-04-30 LAB — LIPASE, BLOOD: LIPASE: 31 U/L (ref 11–51)

## 2018-04-30 LAB — I-STAT TROPONIN, ED
TROPONIN I, POC: 0.01 ng/mL (ref 0.00–0.08)
Troponin i, poc: 0 ng/mL (ref 0.00–0.08)

## 2018-04-30 LAB — GLUCOSE, CAPILLARY: GLUCOSE-CAPILLARY: 136 mg/dL — AB (ref 70–99)

## 2018-04-30 MED ORDER — FUROSEMIDE 40 MG PO TABS
40.0000 mg | ORAL_TABLET | Freq: Every day | ORAL | Status: DC
  Administered 2018-05-01: 40 mg via ORAL
  Filled 2018-04-30: qty 1

## 2018-04-30 MED ORDER — IRBESARTAN 75 MG PO TABS
75.0000 mg | ORAL_TABLET | Freq: Every day | ORAL | Status: DC
  Administered 2018-05-01: 75 mg via ORAL
  Filled 2018-04-30: qty 1

## 2018-04-30 MED ORDER — CITALOPRAM HYDROBROMIDE 20 MG PO TABS
40.0000 mg | ORAL_TABLET | Freq: Every day | ORAL | Status: DC
  Administered 2018-05-01: 40 mg via ORAL
  Filled 2018-04-30 (×3): qty 2

## 2018-04-30 MED ORDER — ENOXAPARIN SODIUM 40 MG/0.4ML ~~LOC~~ SOLN
40.0000 mg | SUBCUTANEOUS | Status: DC
  Administered 2018-04-30 – 2018-05-02 (×3): 40 mg via SUBCUTANEOUS
  Filled 2018-04-30 (×3): qty 0.4

## 2018-04-30 MED ORDER — SODIUM CHLORIDE 0.9 % IV SOLN
INTRAVENOUS | Status: DC
  Administered 2018-04-30 – 2018-05-02 (×4): via INTRAVENOUS

## 2018-04-30 MED ORDER — GABAPENTIN 300 MG PO CAPS
300.0000 mg | ORAL_CAPSULE | Freq: Three times a day (TID) | ORAL | Status: DC
  Administered 2018-04-30 – 2018-05-03 (×8): 300 mg via ORAL
  Filled 2018-04-30 (×8): qty 1

## 2018-04-30 MED ORDER — POLYETHYLENE GLYCOL 3350 17 G PO PACK: 17.0000 g | PACK | Freq: Every day | ORAL | Status: DC | PRN

## 2018-04-30 MED ORDER — LACTATED RINGERS IV BOLUS
1000.0000 mL | Freq: Once | INTRAVENOUS | Status: AC
  Administered 2018-04-30: 1000 mL via INTRAVENOUS

## 2018-04-30 MED ORDER — PANTOPRAZOLE SODIUM 40 MG PO TBEC
40.0000 mg | DELAYED_RELEASE_TABLET | Freq: Every day | ORAL | Status: DC
  Administered 2018-04-30 – 2018-05-03 (×4): 40 mg via ORAL
  Filled 2018-04-30 (×4): qty 1

## 2018-04-30 MED ORDER — ACETAMINOPHEN 650 MG RE SUPP: 650.0000 mg | Freq: Four times a day (QID) | RECTAL | Status: DC | PRN

## 2018-04-30 MED ORDER — CARVEDILOL 12.5 MG PO TABS
12.5000 mg | ORAL_TABLET | Freq: Two times a day (BID) | ORAL | Status: DC
  Administered 2018-05-01 – 2018-05-03 (×5): 12.5 mg via ORAL
  Filled 2018-04-30 (×5): qty 1

## 2018-04-30 MED ORDER — ACETAMINOPHEN 325 MG PO TABS
650.0000 mg | ORAL_TABLET | Freq: Four times a day (QID) | ORAL | Status: DC | PRN
  Administered 2018-04-30 – 2018-05-01 (×2): 650 mg via ORAL
  Filled 2018-04-30 (×3): qty 2

## 2018-04-30 MED ORDER — INSULIN ASPART 100 UNIT/ML ~~LOC~~ SOLN
0.0000 [IU] | Freq: Three times a day (TID) | SUBCUTANEOUS | Status: DC
  Administered 2018-05-01: 5 [IU] via SUBCUTANEOUS
  Administered 2018-05-01: 3 [IU] via SUBCUTANEOUS
  Administered 2018-05-02: 8 [IU] via SUBCUTANEOUS
  Administered 2018-05-02: 3 [IU] via SUBCUTANEOUS
  Administered 2018-05-02: 2 [IU] via SUBCUTANEOUS
  Administered 2018-05-03 (×2): 3 [IU] via SUBCUTANEOUS

## 2018-04-30 MED ORDER — ATORVASTATIN CALCIUM 80 MG PO TABS
80.0000 mg | ORAL_TABLET | Freq: Every evening | ORAL | Status: DC
  Filled 2018-04-30: qty 1

## 2018-04-30 MED ORDER — ALBUTEROL SULFATE (2.5 MG/3ML) 0.083% IN NEBU: 2.5000 mg | INHALATION_SOLUTION | Freq: Four times a day (QID) | RESPIRATORY_TRACT | Status: DC | PRN

## 2018-04-30 MED ORDER — ASPIRIN EC 81 MG PO TBEC
81.0000 mg | DELAYED_RELEASE_TABLET | Freq: Every day | ORAL | Status: DC
  Administered 2018-05-01 – 2018-05-03 (×3): 81 mg via ORAL
  Filled 2018-04-30 (×3): qty 1

## 2018-04-30 MED ORDER — ALBUTEROL SULFATE HFA 108 (90 BASE) MCG/ACT IN AERS: 1.0000 | INHALATION_SPRAY | Freq: Four times a day (QID) | RESPIRATORY_TRACT | Status: DC | PRN

## 2018-04-30 MED ORDER — ONDANSETRON HCL 4 MG/2ML IJ SOLN: 4.0000 mg | Freq: Four times a day (QID) | INTRAMUSCULAR | Status: DC | PRN

## 2018-04-30 MED ORDER — AMLODIPINE BESYLATE 10 MG PO TABS
10.0000 mg | ORAL_TABLET | Freq: Every day | ORAL | Status: DC
  Administered 2018-05-01 – 2018-05-03 (×3): 10 mg via ORAL
  Filled 2018-04-30 (×4): qty 1

## 2018-04-30 MED ORDER — ONDANSETRON HCL 4 MG PO TABS: 4.0000 mg | ORAL_TABLET | Freq: Four times a day (QID) | ORAL | Status: DC | PRN

## 2018-04-30 NOTE — Progress Notes (Signed)
Pt arrived to 6n7 from ED. Ambulated to bathroom-gait steady. U/A obtained at this time.

## 2018-04-30 NOTE — Progress Notes (Signed)

## 2018-04-30 NOTE — H&P (Addendum)
[facility] Admission History and Physical Service Pager: [phone]  Patient name: Tamara Henry Medical record number: [id number] Date of birth: 02-07-1961 Age: 57 y.o. Gender: female  Primary Care Provider: Kathrene Alu, MD Consultants: None  Code Status: Full  Chief Complaint: Abdominal pain and diarrhea   Assessment and Plan: Tamara Henry is a 57 y.o. female presenting with abdominal pain, subsequently found to have transaminitis and AKI. PMH is significant for CKD, hypertension, previous drug use, MI s/p 4 vessel CABG, hyperlipidemia, asthma, diabetes mellitus, OSA, and GERD.   Abdominal pain w/ transaminitis: Pt reports a non-radiating, intermittent "bowling ball" like sensation in her epigastrium worsened with movement, associated with over 20 episodes of diarrhea over the past 3 days. Pain is not related to ingestion of food and she states she usually has a good appetite. On arrival, she was afebrile, hemodynamically stable, and in no acute distress. Physical exam notable for tenderness to palpation of epigastrium and RUQ, without abdominal distension or fluid wave. No jaundice noted. AST 300, ALT 263, previously wnl in 01/2018. Bilirubin wnl at 1.2. Alk phos 188. Lipase 31. No leukocytosis. Tylenol level <10. Troponin negative x2. EKG NSR without ischemic changes. S/p 1L LR bolus in the ED. RUQ ultrasound ordered in the ED, pending. Differential could include hepatitis vs medication adverse effect (on Atorvastatin 55DD) vs non-alcoholic liver disease vs biliary abnormality vs dehydration. Could also consider gastroenteritis, but would expect associated emesis. Pancreatitis unlikely given normal lipase, duodenal and gastric ulcer also unlikely as does not fit clinical picture.  Unlikely ACS, given an atypical presentation and troponin negative x2 without ischemic changes on EKG, however will trend troponin.  -Admit to Med-Surg; attending Dr. Gwendlyn Henry  -S/p 1L LR  in the ED; cont mIVF NS 75 ml/hr  -F/u RUQ ultrasound ordered in the ED  -Hepatitis panel pending  -Troponin  -U/A and UDS  -Zofran 79m IV as needed for nausea  -CMP in the am  -Vitals per routine    Acute on chronic kidney disease: Cr 2.21 on admission, baseline around 1.5. Follows with Tamara Henry Likely secondary to dehydration in the setting of recent excessive diarrhea vs hepatorenal syndrome. S/p 1L LR in the ED.  -Monitor CMP in the am   -Avoid nephrotoxic medications  -Cont mIVF NS 75 ml/hr   Diabetes Mellitus with neuropathy: A1c 7.8 in 04/2018. Glucose 190 on admission. Takes gabapentin 301m Lantus 50 units nightly, and Humalog 5 units BID at home.   -modSSI  -Monitor glucose   Hypertension: Normotensive at admission, BP 138/78. Takes Amlodipine 10 mg, Carvedilol 12.5 mg BID, and olmesartan 20 mg at home for control.  -Cont home medications as above    Hyperlipidemia: Lipid panel in 01/2018 wnl. Takes Atorvastatin 80 mg at home.   -Hold home atorvastatin in setting of transaminitis   NSTEMI S/p CABG: In 07/2016. Not currently endorsing any chest pain. Follows with Dr. TuRadford Pax[place] aspirin 81 mg -Cont home Coreg 12.5 mg BID  -Holding home atorvastatin as above   History of drug abuse: States she used to be addicted to "crack" cocaine, reports being clean for 12 years. Denies any other illicit drug use. Also quit smoking cigarettes 12 years ago, with an approximate 20 pack year history. Currently living at a group home.  -UDS   Asthma: Not currently endorsing dyspnea. Lungs clear on exam without increased WOB. Takes Albuterol as needed at home, but has not needed in months.  -Albuterol as  needed   OSA: Continue home CPAP   GERD: Takes omeprazole 74m at home. States this current pain is different from her usual heartburn.  -substitute with pantoprazole 487mdaily    Chronic Depression: Endorses a lot of stressors at her group home, but denies  SI/HI.  -Cont home citalopram    FEN/GI: Heart heathy, IV in place, mIVF NS 7559mr Prophylaxis: Lovenox   Disposition: Admit to med-surg, attending Dr. EniGwendlyn DeutscherHistory of Present Illness:  [name], with a past history significant for CKD, hypertension, previous drug use, and diabetes mellitus, presenting with abdominal pain, HA, and diarrhea for the past 3 days. She states starting on Saturday, she developed a pressure-like bilateral headache with associated malaise. During this time, she also started having non-bloody watery bowel movements, reporting over 20 episodes since Saturday, and a gradual onset of a "bowling ball" sensation centrally in her abdomen. Improvement in diarrhea today, however patient has not had anything to eat. Her non-radiating intermittent abdominal "heaviness" is noted to be worse with standing and physical activity. No association with eating or bowel movements. She has been taking some ibuprofen, but with minimally improvement in her abdominal pain and headaches. Denied any tylenol or illicit drug use. She has been eating normally over the past few days, other than today at which she had a few graham crackers. Denies any nausea, vomiting, fever, dysuria, vaginal discharge, or sick contacts at home. [place] [facility] [place] [place]  Following her bilateral intraocular injection appointment today, she felt lightheaded and subsequently presented to the ED for further evaluation.   Of note, she also reports feeling more "winded" than usual with physical activity over the past few days. She previously had an MI and four vessel CABG in 2017. Her current pain is not similar to her previous MI, but has been coughing lately that she notes was also present during her MI.   On arrival to the ED, she was hemodynamically stable and in no acute distress. CBC and CMP notable for glucose 190, Cr 2.21, Alk phos 188, AST 300, ALT 263, bilirubin  1.2. Lipase 31. WBC 8.5, hgb 12.9. Tylenol level <10. RUQ ultrasound ordered in the ED. She received 1L LR bolus. She was admitted for further evaluation of new onset transaminitis and acute kidney injury.     Review Of Systems: Per HPI with the following additions:   Review of Systems  Constitutional: Positive for malaise/fatigue. Negative for chills and fever.  Eyes: Negative for blurred vision and double vision.  Respiratory: Positive for cough. Negative for hemoptysis and wheezing.   Cardiovascular: Negative for chest pain and palpitations.  Gastrointestinal: Positive for abdominal pain, diarrhea and heartburn. Negative for blood in stool, constipation, nausea and vomiting.  Genitourinary: Negative for dysuria and urgency.    Patient Active Problem List   Diagnosis Date Noted  . Left hand pain 04/18/2018  . Sprain of ankle 03/25/2018  . Pain in lower jaw 01/16/2018  . Healthcare maintenance 12/11/2017  . Vaginal bleeding 10/09/2017  . Postmenopausal vaginal bleeding 10/04/2017  . Cough 09/28/2017  . Chronic pansinusitis 09/28/2017  . Laryngopharyngeal reflux (LPR) 08/28/2017  . Sensorineural hearing loss (SNHL), bilateral 08/28/2017  . Diarrhea 08/04/2017  . Arm pain, anterior, right 06/17/2017  . OSA on CPAP 05/03/2017  . Morbid obesity due to excess calories ([place]8/23/2018  . Microscopic hematuria 03/27/2017  . Impingement syndrome of left shoulder 02/19/2017  .  Hypersomnia 12/12/2016  . REM behavioral disorder 12/12/2016  . Asthma 12/12/2016  . Neck pain 11/17/2016  . CKD (chronic kidney disease), stage III ([place]) 11/02/2016  . CAD (coronary artery disease), native coronary artery 10/26/2016  . Poor social situation 10/13/2016  . Leg pain 08/30/2016  . Back pain 08/30/2016  . 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 GERD (gastroesophageal reflux disease) 06/09/2016  . Obesity (BMI 30-39.9) 06/09/2016  . Facial swelling 05/21/2016  . Hyperlipidemia 05/17/2016  . Depression  05/17/2016  . Diabetic peripheral neuropathy ([place]) 05/17/2016  . Neuritis of upper extremity, C7 01/15/2015  . Hypertension 01/14/2015  . Numbness and tingling of right arm 01/14/2015  . Proliferative diabetic retinopathy ([place]) 02/24/2014  . Diabetes mellitus ([place]) 12/18/2013  . Nuclear cataract of both eyes 12/18/2013    Past Medical History: Past Medical History:  Diagnosis Date  . Acid reflux   . Allergy    seasonal  . Anxiety   . Arthritis    lower back, feet  . Asthma    albuterol inhaler - rarely uses - only needs when she gets sick  . Back pain   . Bilateral pulmonary embolism ([place]) 05/2016  . Bipolar disorder ([place])   . CAD in native artery    S/P NSTEMI with cath showing severe 3 vessel ASCAD s/p CABGx4 07/28/16.  . Cataracts, bilateral   . CKD (chronic kidney disease), stage III (HCC)    borderline CKD II-III  . Cocaine abuse ([place])    In remission. Stopped using in early 2000's  . Depression   . Diabetes mellitus ([place])    Type II  . GERD (gastroesophageal reflux disease)   . Glaucoma   . High cholesterol   . History of blood transfusion 2017  . History of pulmonary embolus (PE)   . 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 Hx of chest tube placement right   . Hypertension   . Morbid obesity ([place])   . Myocardial infarction ([place]) 2017  . Neuromuscular disorder (HCC)    neuropathy in hands and feet  . Neuropathy    hands and feet  . Pneumonia   . Postoperative anemia 07/2016  . PTSD (post-traumatic stress disorder)   . Sleep apnea    Does not use cpap  . Stroke [facility]) 2005   no residual  . SVD (spontaneous vaginal delivery)    x 4    Past Surgical History: Past Surgical History:  Procedure Laterality Date  . CARDIAC CATHETERIZATION N/A 07/25/2016   Procedure: Left Heart Cath and Coronary Angiography;  Surgeon: Peter M Martinique, MD;  Location: [facility];  Service: Cardiovascular;  Laterality: N/A;  . COLONOSCOPY    . COLONOSCOPY W/ POLYPECTOMY    . CORONARY  ARTERY BYPASS GRAFT N/A 07/28/2016   Procedure: CORONARY ARTERY BYPASS GRAFTING (CABG)x4 using left internal mammary and endoscopic harvest of right greater saphenous vein;  Surgeon: Ivin Poot, MD;  Location: [facility];  Service: Open Heart Surgery;  Laterality: N/A;  . DENTAL SURGERY     to removed broken teeth  . HYSTEROSCOPY W/D&C N/A 01/31/2018   Procedure: DILATATION AND CURETTAGE /HYSTEROSCOPY;  Surgeon: Woodroe Mode, MD;  Location: [facility];  Service: Gynecology;  Laterality: N/A;  . POLYPECTOMY    . PULMONARY EMBOLISM SURGERY  2017   [facility]  . SHOULDER ARTHROSCOPY WITH ROTATOR CUFF REPAIR Left 02/19/2017   Procedure: LEFT SHOULDER ARTHROSCOPY with debridement andROTATOR CUFF REPAIR;  Surgeon: [name], MD;  Location:  [facility];  Service: Orthopedics;  Laterality: Left;  . TEE WITHOUT CARDIOVERSION N/A 07/28/2016   Procedure: TRANSESOPHAGEAL ECHOCARDIOGRAM (TEE);  Surgeon: Ivin Poot, MD;  Location: [place];  Service: Open Heart Surgery;  Laterality: N/A;  . TONSILLECTOMY    . TUBAL LIGATION      Social History: Social History   Tobacco Use  . Smoking status: Former Smoker    Packs/day: 2.00    Years: 10.00    Pack years: 20.00    Types: Cigarettes    Last attempt to quit: 09/11/2005    Years since quitting: 12.6  . Smokeless tobacco: Never Used  Substance Use Topics  . Alcohol use: No  . Drug use: Not Currently    Types: Cocaine    Comment: History Cocaine - last use in 2000's   Additional social history: Lives in group home [facility], former tobacco use - quit about 12 years ago. Past history of crack cocaine use (clean x12 years). Social alcohol use.   Please also refer to relevant sections of EMR.  Family History: Family History  Problem Relation Age of Onset  . Pancreatitis Mother   . Diabetes type II Mother   . Diabetes Mother   . CAD Father   . Heart attack Father   . Diabetes type II Sister   . Diabetes Sister   . Diabetes type II Brother   .  Diabetes Brother   . Diabetes Brother   . Diabetes Brother   . Clotting disorder Son        heart attack x 3   . Breast cancer Maternal Aunt   . Colon cancer Neg Hx   . Stomach cancer Neg Hx     Allergies and Medications: Allergies  Allergen Reactions  . Lactose Intolerance (Gi) Diarrhea  . Lisinopril Other (See Comments) and Cough    Inflammation, coughing  . Reglan [Metoclopramide] Other (See Comments)    REACTION IS SIDE EFFECT Pt stated having lock jaw as a side effect  . Wellbutrin [Bupropion] Nausea And Vomiting and Cough  . Other Other (See Comments)    Biguanide - Unknown  . Latex Rash  . Metformin And Related Diarrhea  . Penicillins Rash and Other (See Comments)    [FROM PREVIOUS ENTRY-BEFORE 07/27/16] >>"ALL CILLINS"  PATIENT HAD A PCN REACTION WITH IMMEDIATE RASH, FACIAL/TONGUE/THROAT SWELLING, SOB, OR LIGHTHEADEDNESS WITH HYPOTENSION:  #  #  #  YES  #  #  #   Has patient had a PCN reaction causing severe rash involving mucus membranes or skin necrosis: No Has patient had a PCN reaction that required hospitalization No Has patient had a PCN reaction occurring within the last 10 years:   #  #  #  YES  #  #  #   . Sulfa Antibiotics Itching  . Tape Rash and Other (See Comments)    Adhesive tape   No current facility-administered medications on file prior to encounter.    Current Outpatient Medications on File Prior to Encounter  Medication Sig Dispense Refill  . acetaminophen (TYLENOL) 650 MG CR tablet Take 1 tablet (650 mg total) by mouth every 8 (eight) hours as needed for pain. 90 tablet 1  . albuterol (PROVENTIL HFA;VENTOLIN HFA) 108 (90 Base) MCG/ACT inhaler Inhale 1-2 puffs into the lungs every 6 (six) hours as needed for wheezing or shortness of breath. 18 g 2  . amLODipine (NORVASC) 10 MG tablet Take 1 tablet (10 mg total) by mouth  daily. 28 tablet 3  . aspirin 81 MG EC tablet Take 1 tablet (81 mg total) by mouth daily. 30 tablet 12  . atorvastatin (LIPITOR)  80 MG tablet Take 1 tablet (80 mg total) by mouth every evening. 90 tablet 3  . b complex vitamins tablet Take 1 tablet by mouth daily.    . B-D UF III MINI PEN NEEDLES 31G X 5 MM MISC See admin instructions. Use with insulin injections  0  . Biotin 5 MG TBDP Take 1 tablet (5 mg total) by mouth daily. 30 tablet 11  . calcipotriene (DOVONOX) 0.005 % cream Apply 1 application topically as needed (itching). 60 g 0  . carvedilol (COREG) 12.5 MG tablet Take 1 tablet (12.5 mg total) by mouth 2 (two) times daily with a meal. 60 tablet 3  . citalopram (CELEXA) 40 MG tablet Take 1 pill (40 mg) daily 90 tablet 3  . diphenoxylate-atropine (LOMOTIL) 2.5-0.025 MG tablet Take 1 tablet by mouth 4 (four) times daily as needed for diarrhea or loose stools. Take 1-2 tablets up to 4 times daily. 30 tablet 0  . ferrous sulfate 325 (65 FE) MG tablet Take 1 tablet (325 mg total) by mouth daily with breakfast. 30 tablet 3  . Flaxseed, Linseed, (FLAX SEED OIL) 1000 MG CAPS Take 1,000 mg by mouth daily.    . folic acid (FOLVITE) 086 MCG tablet Take 1 tablet (400 mcg total) by mouth daily. 90 tablet 3  . furosemide (LASIX) 40 MG tablet Take 1 tablet (40 mg total) by mouth daily. 28 tablet 3  . gabapentin (NEURONTIN) 300 MG capsule Take 3 capsules EVERY MORNING, Take 2 capsules at lunch, and take 3 capsules at bedtime 224 capsule 2  . glucose blood (ONETOUCH VERIO) test strip Check blood sugar 6 x daily 200 each 3  . guaiFENesin (MUCINEX) 600 MG 12 hr tablet Take 2 tablets (1,200 mg total) 2 (two) times daily by mouth. (Patient taking differently: Take 1,200 mg by mouth 2 (two) times daily as needed for cough or to loosen phlegm. ) 14 tablet 0  . ibuprofen (ADVIL,MOTRIN) 600 MG tablet Take 1 tablet (600 mg total) by mouth every 6 (six) hours as needed. 30 tablet 0  . Insulin Glargine (LANTUS SOLOSTAR) 100 UNIT/ML Solostar Pen Inject 50 Units into the skin daily at 10 pm. 5 pen PRN  . insulin lispro (HUMALOG KWIKPEN) 100  UNIT/ML KiwkPen Inject 0.05 mLs (5 Units total) into the skin 2 (two) times daily with a meal. 15 mL 1  . KRILL OIL PO Take 1 capsule by mouth daily.    Marland Kitchen loperamide (IMODIUM A-D) 2 MG tablet Take 4 mg by mouth as needed for diarrhea or loose stools.    . Melatonin 5 MG CAPS Take 1 capsule (5 mg total) by mouth at bedtime. 90 capsule 3  . mometasone (NASONEX) 50 MCG/ACT nasal spray Place 2 sprays into the nose daily as needed. 17 g 0  . MULTIPLE VITAMIN PO Take 1 tablet by mouth daily.    . Multiple Vitamins-Minerals (MULTIVITAMIN) tablet Take 1 tablet by mouth daily. 30 tablet 5  . olmesartan (BENICAR) 20 MG tablet Take 1 tablet (20 mg total) by mouth daily.    Marland Kitchen omeprazole (PRILOSEC) 40 MG capsule Take 1 capsule (40 mg total) by mouth daily. 90 capsule 2  . Oral Medication Containers (PILL CASE) MISC 1 Units by Does not apply route daily. 2 each 0  . Polyvinyl Alcohol (LUBRICANT DROPS OP) Place  1 drop into both eyes daily as needed (dry eyes).    . Probiotic Product (ALIGN) 4 MG CAPS Take 1 capsule (4 mg total) by mouth daily. 30 capsule 2  . tiZANidine (ZANAFLEX) 4 MG tablet TAKE 1 TABLET BY MOUTH EVERY 6 HOURS AS NEEDED FOR MUSCLE SPASM 30 tablet 0  . vitamin C (ASCORBIC ACID) 500 MG tablet Take 1 tablet (500 mg total) by mouth daily. 90 tablet 3  . Witch Hazel (PREPARATION H) 50 % PADS Apply 1 application topically 2 (two) times daily as needed (itching). 60 each 3    Objective: BP 138/78   Pulse 76   Temp 98.3 F (36.8 C) (Oral)   Resp 16   LMP  (LMP Unknown)   SpO2 97%    Exam:  General: Alert, NAD  HEENT: NCAT, MM dry, oropharynx nonerythematous, bilateral sclera mildly injected, no jaundice noted.   Cardiac: RRR no m/g/r Lungs: Clear bilaterally, no increased WOB  Abdomen: soft large abdomen, non-distended, normoactive BS. TTP of epigastrium and RUQ. Murphy's sign negative. No fluid shift noted.  Msk: Moves all extremities spontaneously  Ext: Warm, dry, 2+ distal pulses, no  edema  Skin: Healed scars noted on anterior chest and epigastrium from CABG. No rashes or bruising noted.  Psych: appropriate affect    Labs and Imaging: CBC BMET  Recent Labs  Lab 04/30/18 1407  WBC 8.5  HGB 12.9  HCT 41.3  PLT 194   Recent Labs  Lab 04/30/18 1407  NA 135  K 4.2  CL 104  CO2 22  BUN 29*  CREATININE 2.21*  GLUCOSE 190*  CALCIUM 8.4*      Beard, Ralph Leyden, DO  PGY-1, [facility]  I have seen and evaluated the above patient with Dr. Higinio Plan and agree with her documentation.  I have included my edits in blue.   Lovenia Kim MD [place] PGY3

## 2018-04-30 NOTE — ED Provider Notes (Signed)
[facility] EMERGENCY DEPARTMENT Provider Note   CSN: [id number] Arrival date & time: 04/30/18  1352   History   Chief Complaint Chief Complaint  Patient presents with  . Abdominal Pain    HPI Tamara Henry is a 57 y.o. female.  HPI 57 year old female with history of obesity, CAD status post 4V CABG in 2017, CKD, polysubstance abuse, bipolar disorder, hypertension, diabetes, prior PE not on anticoagulation who presents to the emergency department for evaluation of abdominal pain and diarrhea.  Patient reports that since Saturday she has had about 20 episodes of watery diarrhea daily.  She has had no hematochezia or melena.  Reports sometimes her stool is dark which she attributes to taking iron.  States she has been having cramping throughout her abdomen worse in the epigastric region described as a "bowling ball" sensation.  Reports history of tubal ligation and proximal me 20 years ago however no other abdominal surgeries in the past.  No vomiting.  No fevers or chills.  Denies any known sick exposures.  Denies any history of IV drug abuse.  No history of hepatitis that she is aware of.  No known sick exposures or travel though does report she lives in a group home.  Reports taking 2 Tylenol a couple of days ago for headache that has since resolved.  Denies chronic Tylenol or NSAID use.  Reports having intraocular injections bilaterally today done with ophthalmology for chronic glaucoma.  Patient reports that she felt lightheaded today after leaving her doctor's appointment prompting her to come to the emergency department. Diarrhea not better with immodium.    Past Medical History:  Diagnosis Date  . Acid reflux   . Allergy    seasonal  . Anxiety   . Arthritis    lower back, feet  . Asthma    albuterol inhaler - rarely uses - only needs when she gets sick  . Back pain   . Bilateral pulmonary embolism ([place]) 05/2016  . Bipolar disorder ([place])   . CAD in native  artery    S/P NSTEMI with cath showing severe 3 vessel ASCAD s/p CABGx4 07/28/16.  . Cataracts, bilateral   . CKD (chronic kidney disease), stage III (HCC)    borderline CKD II-III  . Cocaine abuse ([place])    In remission. Stopped using in early 2000's  . Depression   . Diabetes mellitus ([place])    Type II  . GERD (gastroesophageal reflux disease)   . Glaucoma   . High cholesterol   . History of blood transfusion 2017  . History of pulmonary embolus (PE)   . 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 Hx of chest tube placement right   . Hypertension   . Morbid obesity ([place])   . Myocardial infarction ([place]) 2017  . Neuromuscular disorder (HCC)    neuropathy in hands and feet  . Neuropathy    hands and feet  . Pneumonia   . Postoperative anemia 07/2016  . PTSD (post-traumatic stress disorder)   . Sleep apnea    Does not use cpap  . Stroke [facility]) 2005   no residual  . SVD (spontaneous vaginal delivery)    x 4    Patient Active Problem List   Diagnosis Date Noted  . Transaminitis 04/30/2018  . Left hand pain 04/18/2018  . Sprain of ankle 03/25/2018  . Pain in lower jaw 01/16/2018  . Healthcare maintenance 12/11/2017  . Vaginal bleeding 10/09/2017  . Postmenopausal vaginal bleeding 10/04/2017  .  Cough 09/28/2017  . Chronic pansinusitis 09/28/2017  . Laryngopharyngeal reflux (LPR) 08/28/2017  . Sensorineural hearing loss (SNHL), bilateral 08/28/2017  . Diarrhea 08/04/2017  . Arm pain, anterior, right 06/17/2017  . OSA on CPAP 05/03/2017  . Morbid obesity due to excess calories ([place]) 05/03/2017  . Microscopic hematuria 03/27/2017  . Impingement syndrome of left shoulder 02/19/2017  . Hypersomnia 12/12/2016  . REM behavioral disorder 12/12/2016  . Asthma 12/12/2016  . Neck pain 11/17/2016  . CKD (chronic kidney disease), stage III ([place]) 11/02/2016  . CAD (coronary artery disease), native coronary artery 10/26/2016  . Poor social situation 10/13/2016  . Leg pain 08/30/2016  . Back pain  08/30/2016  . 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 GERD (gastroesophageal reflux disease) 06/09/2016  . Obesity (BMI 30-39.9) 06/09/2016  . Facial swelling 05/21/2016  . Hyperlipidemia 05/17/2016  . Depression 05/17/2016  . Diabetic peripheral neuropathy ([place]) 05/17/2016  . Neuritis of upper extremity, C7 01/15/2015  . Hypertension 01/14/2015  . Numbness and tingling of right arm 01/14/2015  . Proliferative diabetic retinopathy ([place]) 02/24/2014  . Diabetes mellitus ([place]) 12/18/2013  . Nuclear cataract of both eyes 12/18/2013    Past Surgical History:  Procedure Laterality Date  . CARDIAC CATHETERIZATION N/A 07/25/2016   Procedure: Left Heart Cath and Coronary Angiography;  Surgeon: Peter M Martinique, MD;  Location: [facility];  Service: Cardiovascular;  Laterality: N/A;  . COLONOSCOPY    . COLONOSCOPY W/ POLYPECTOMY    . CORONARY ARTERY BYPASS GRAFT N/A 07/28/2016   Procedure: CORONARY ARTERY BYPASS GRAFTING (CABG)x4 using left internal mammary and endoscopic harvest of right greater saphenous vein;  Surgeon: Ivin Poot, MD;  Location: [facility];  Service: Open Heart Surgery;  Laterality: N/A;  . DENTAL SURGERY     to removed broken teeth  . HYSTEROSCOPY W/D&C N/A 01/31/2018   Procedure: DILATATION AND CURETTAGE /HYSTEROSCOPY;  Surgeon: Woodroe Mode, MD;  Location: [facility];  Service: Gynecology;  Laterality: N/A;  . POLYPECTOMY    . PULMONARY EMBOLISM SURGERY  2017   [facility]  . SHOULDER ARTHROSCOPY WITH ROTATOR CUFF REPAIR Left 02/19/2017   Procedure: LEFT SHOULDER ARTHROSCOPY with debridement andROTATOR CUFF REPAIR;  Surgeon: [name], MD;  Location: [facility];  Service: Orthopedics;  Laterality: Left;  . TEE WITHOUT CARDIOVERSION N/A 07/28/2016   Procedure: TRANSESOPHAGEAL ECHOCARDIOGRAM (TEE);  Surgeon: Ivin Poot, MD;  Location: [facility];  Service: Open Heart Surgery;  Laterality: N/A;  . TONSILLECTOMY    . TUBAL LIGATION       OB History    Gravida  5   Para  4   Term    4   Preterm      AB  1   Living  4     SAB      TAB  1   Ectopic      Multiple      Live Births               Home Medications    Prior to Admission medications   Medication Sig Start Date End Date Taking? Authorizing Provider  acetaminophen (TYLENOL) 650 MG CR tablet Take 1 tablet (650 mg total) by mouth every 8 (eight) hours as needed for pain. 04/25/18   Kathrene Alu, MD  albuterol (PROVENTIL HFA;VENTOLIN HFA) 108 (90 Base) MCG/ACT inhaler Inhale 1-2 puffs into the lungs every 6 (six) hours as needed for wheezing or shortness of breath. 04/25/18   Winfrey,  Alcario Drought, MD  amLODipine (NORVASC) 10 MG tablet Take 1 tablet (10 mg total) by mouth daily. 04/25/18   Kathrene Alu, MD  aspirin 81 MG EC tablet Take 1 tablet (81 mg total) by mouth daily. 04/25/18   Kathrene Alu, MD  atorvastatin (LIPITOR) 80 MG tablet Take 1 tablet (80 mg total) by mouth every evening. 04/25/18   Kathrene Alu, MD  b complex vitamins tablet Take 1 tablet by mouth daily.    [provider]  B-D UF III MINI PEN NEEDLES 31G X 5 MM MISC See admin instructions. Use with insulin injections 02/11/18   [provider]  Biotin 5 MG TBDP Take 1 tablet (5 mg total) by mouth daily. 04/25/18   Kathrene Alu, MD  calcipotriene (DOVONOX) 0.005 % cream Apply 1 application topically as needed (itching). 04/25/18   Kathrene Alu, MD  carvedilol (COREG) 12.5 MG tablet Take 1 tablet (12.5 mg total) by mouth 2 (two) times daily with a meal. 04/25/18   Winfrey, Alcario Drought, MD  citalopram (CELEXA) 40 MG tablet Take 1 pill (40 mg) daily 04/25/18   Winfrey, Alcario Drought, MD  diphenoxylate-atropine (LOMOTIL) 2.5-0.025 MG tablet Take 1 tablet by mouth 4 (four) times daily as needed for diarrhea or loose stools. Take 1-2 tablets up to 4 times daily. 04/25/18   Kathrene Alu, MD  ferrous sulfate 325 (65 FE) MG tablet Take 1 tablet (325 mg total) by mouth daily with breakfast. 04/25/18   Winfrey,  Alcario Drought, MD  Flaxseed, Linseed, (FLAX SEED OIL) 1000 MG CAPS Take 1,000 mg by mouth daily.    [provider]  folic acid (FOLVITE) 694 MCG tablet Take 1 tablet (400 mcg total) by mouth daily. 04/25/18   Kathrene Alu, MD  furosemide (LASIX) 40 MG tablet Take 1 tablet (40 mg total) by mouth daily. 04/25/18   Kathrene Alu, MD  gabapentin (NEURONTIN) 300 MG capsule Take 3 capsules EVERY MORNING, Take 2 capsules at lunch, and take 3 capsules at bedtime 04/25/18   Kathrene Alu, MD  glucose blood (ONETOUCH VERIO) test strip Check blood sugar 6 x daily 04/25/18   Kathrene Alu, MD  guaiFENesin (MUCINEX) 600 MG 12 hr tablet Take 2 tablets (1,200 mg total) 2 (two) times daily by mouth. Patient taking differently: Take 1,200 mg by mouth 2 (two) times daily as needed for cough or to loosen phlegm.  07/28/17   Langston Masker B, PA-C  ibuprofen (ADVIL,MOTRIN) 600 MG tablet Take 1 tablet (600 mg total) by mouth every 6 (six) hours as needed. 04/25/18   Kathrene Alu, MD  Insulin Glargine (LANTUS SOLOSTAR) 100 UNIT/ML Solostar Pen Inject 50 Units into the skin daily at 10 pm. 04/25/18   Winfrey, Alcario Drought, MD  insulin lispro (HUMALOG KWIKPEN) 100 UNIT/ML KiwkPen Inject 0.05 mLs (5 Units total) into the skin 2 (two) times daily with a meal. 04/25/18   Winfrey, Alcario Drought, MD  KRILL OIL PO Take 1 capsule by mouth daily.    [provider]  loperamide (IMODIUM A-D) 2 MG tablet Take 4 mg by mouth as needed for diarrhea or loose stools.    [provider]  Melatonin 5 MG CAPS Take 1 capsule (5 mg total) by mouth at bedtime. 04/25/18   Kathrene Alu, MD  mometasone (NASONEX) 50 MCG/ACT nasal spray Place 2 sprays into the nose daily as needed. 04/25/18   Kathrene Alu, MD  MULTIPLE VITAMIN  PO Take 1 tablet by mouth daily.    [provider]  Multiple Vitamins-Minerals (MULTIVITAMIN) tablet Take 1 tablet by mouth daily. 04/25/18   Kathrene Alu, MD  olmesartan  (BENICAR) 20 MG tablet Take 1 tablet (20 mg total) by mouth daily. 04/25/18   Kathrene Alu, MD  omeprazole (PRILOSEC) 40 MG capsule Take 1 capsule (40 mg total) by mouth daily. 04/25/18   Kathrene Alu, MD  Oral Medication Containers (PILL CASE) MISC 1 Units by Does not apply route daily. 04/25/18   Kathrene Alu, MD  Polyvinyl Alcohol (LUBRICANT DROPS OP) Place 1 drop into both eyes daily as needed (dry eyes).    [provider]  Probiotic Product (ALIGN) 4 MG CAPS Take 1 capsule (4 mg total) by mouth daily. 04/25/18   Kathrene Alu, MD  tiZANidine (ZANAFLEX) 4 MG tablet TAKE 1 TABLET BY MOUTH EVERY 6 HOURS AS NEEDED FOR MUSCLE SPASM 04/25/18   Kathrene Alu, MD  vitamin C (ASCORBIC ACID) 500 MG tablet Take 1 tablet (500 mg total) by mouth daily. 04/25/18   Kathrene Alu, MD  Witch Hazel (PREPARATION H) 50 % PADS Apply 1 application topically 2 (two) times daily as needed (itching). 04/25/18   Kathrene Alu, MD    Family History Family History  Problem Relation Age of Onset  . Pancreatitis Mother   . Diabetes type II Mother   . Diabetes Mother   . CAD Father   . Heart attack Father   . Diabetes type II Sister   . Diabetes Sister   . Diabetes type II Brother   . Diabetes Brother   . Diabetes Brother   . Diabetes Brother   . Clotting disorder Son        heart attack x 3   . Breast cancer Maternal Aunt   . Colon cancer Neg Hx   . Stomach cancer Neg Hx     Social History Social History   Tobacco Use  . Smoking status: Former Smoker    Packs/day: 2.00    Years: 10.00    Pack years: 20.00    Types: Cigarettes    Last attempt to quit: 09/11/2005    Years since quitting: 12.6  . Smokeless tobacco: Never Used  Substance Use Topics  . Alcohol use: No  . Drug use: Not Currently    Types: Cocaine    Comment: History Cocaine - last use in 2000's     Allergies   Lactose intolerance (gi); Lisinopril; Reglan [metoclopramide]; Wellbutrin [bupropion];  Other; Latex; Metformin and related; Penicillins; Sulfa antibiotics; and Tape   Review of Systems Review of Systems  Constitutional: Positive for fatigue. Negative for chills and fever.  HENT: Negative for congestion and sore throat.   Eyes: Negative for visual disturbance.  Respiratory: Negative for cough and shortness of breath.   Cardiovascular: Negative for chest pain and leg swelling.  Gastrointestinal: Positive for abdominal distention, abdominal pain and diarrhea. Negative for blood in stool, nausea and vomiting.  Genitourinary: Negative for dysuria and hematuria.  Musculoskeletal: Negative for back pain and neck pain.  Skin: Negative for color change and rash.  Neurological: Positive for light-headedness. Negative for weakness and headaches.  All other systems reviewed and are negative.    Physical Exam Updated Vital Signs BP (!) 152/69 (BP Location: Left Arm)   Pulse 70   Temp 98 F (36.7 C) (Oral)   Resp 16   Ht 5\' 4"  (1.626 m)  Wt 103.6 kg   LMP  (LMP Unknown)   SpO2 98%   BMI 39.19 kg/m   Physical Exam  Constitutional: She appears well-developed. No distress.  Morbidly obese.  HENT:  Head: Normocephalic and atraumatic.  Dry mucous membranes  Eyes: Conjunctivae are normal.  Neck: Neck supple.  Cardiovascular: Regular rhythm and intact distal pulses.  Pulmonary/Chest: Effort normal and breath sounds normal. No respiratory distress.  Abdominal: Soft. She exhibits no distension. There is no hepatosplenomegaly. There is tenderness (mild, worse in epigastric). There is no CVA tenderness.  Negative Murphy sign.  No right lower quadrant pain  Musculoskeletal: She exhibits no edema.  Neurological: She is alert.  Skin: Skin is warm and dry. Capillary refill takes 2 to 3 seconds. Rash:    Psychiatric: She has a normal mood and affect.  Nursing note and vitals reviewed.    ED Treatments / Results  Labs (all labs ordered are listed, but only abnormal results  are displayed) Labs Reviewed  COMPREHENSIVE METABOLIC PANEL - Abnormal; Notable for the following components:      Result Value   Glucose, Bld 190 (*)    BUN 29 (*)    Creatinine, Ser 2.21 (*)    Calcium 8.4 (*)    Albumin 3.1 (*)    AST 300 (*)    ALT 263 (*)    Alkaline Phosphatase 188 (*)    GFR calc non Af Amer 24 (*)    GFR calc Af Amer 28 (*)    All other components within normal limits  ACETAMINOPHEN LEVEL - Abnormal; Notable for the following components:   Acetaminophen (Tylenol), Serum <10 (*)    All other components within normal limits  LIPASE, BLOOD  CBC  PROTIME-INR  URINALYSIS, ROUTINE W REFLEX MICROSCOPIC  HEPATITIS PANEL, ACUTE  HEMOGLOBIN A1C  COMPREHENSIVE METABOLIC PANEL  CBC  I-STAT TROPONIN, ED  I-STAT TROPONIN, ED    EKG EKG Interpretation  Date/Time:  Tuesday April 30 2018 14:05:37 EDT Ventricular Rate:  75 PR Interval:  150 QRS Duration: 90 QT Interval:  418 QTC Calculation: 466 R Axis:   91 Text Interpretation:  Normal sinus rhythm Rightward axis Cannot rule out Inferior infarct , age undetermined Abnormal ECG since last tracing no significant change Confirmed by Noemi Chapel [phone]) on 04/30/2018 4:09:16 PM   Radiology No results found.  Procedures Procedures (including critical care time)  Medications Ordered in ED Medications  aspirin EC tablet 81 mg (has no administration in time range)  amLODipine (NORVASC) tablet 10 mg (has no administration in time range)  atorvastatin (LIPITOR) tablet 80 mg (has no administration in time range)  carvedilol (COREG) tablet 12.5 mg (has no administration in time range)  furosemide (LASIX) tablet 40 mg (has no administration in time range)  irbesartan (AVAPRO) tablet 75 mg (has no administration in time range)  citalopram (CELEXA) tablet 40 mg (has no administration in time range)  pantoprazole (PROTONIX) EC tablet 40 mg (has no administration in time range)  gabapentin (NEURONTIN) capsule 300  mg (has no administration in time range)  enoxaparin (LOVENOX) injection 40 mg (has no administration in time range)  0.9 %  sodium chloride infusion (has no administration in time range)  acetaminophen (TYLENOL) tablet 650 mg (has no administration in time range)    Or  acetaminophen (TYLENOL) suppository 650 mg (has no administration in time range)  polyethylene glycol (MIRALAX / GLYCOLAX) packet 17 g (has no administration in time range)  ondansetron (ZOFRAN) tablet 4 mg (  has no administration in time range)    Or  ondansetron (ZOFRAN) injection 4 mg (has no administration in time range)  insulin aspart (novoLOG) injection 0-15 Units (has no administration in time range)  albuterol (PROVENTIL) (2.5 MG/3ML) 0.083% nebulizer solution 2.5 mg (has no administration in time range)  lactated ringers bolus 1,000 mL (0 mLs Intravenous Stopped 04/30/18 1900)     Initial Impression / Assessment and Plan / ED Course  I have reviewed the triage vital signs and the nursing notes.  Pertinent labs & imaging results that were available during my care of the patient were reviewed by me and considered in my medical decision making (see chart for details).    57 year old female with history of obesity, CAD status post 4V CABG in 2017, CKD, polysubstance abuse, bipolar disorder, hypertension, diabetes, prior PE not on anticoagulation who presents to the emergency department for evaluation of abdominal pain and diarrhea.    Patient is afebrile and hemodynamically stable in the emergency department.  History exam as above.  Does have tenderness more prominently in the epigastric region.  No significant Murphy sign.  No rebound or guarding or signs of surgical abdomen.  Does appear dry on exam.  Labs from quick look process reviewed including CBC, CMP, lipase, troponin.  These are remarkable for new transaminitis with AST of 300 and ALT of 263.  T bili is within normal limits.  Patient also has AKI on CKD with  creatinine of 2.21 up from baseline approximate 1.5.  She has no right upper quadrant pain to suggest cholecystitis at this time however he does remain on differential given transaminitis.  Will obtain right upper quadrant ultrasound.  Lipase is within normal limits making pancreatitis less likely. No significant electrolyte derangements and CBC unremarkable. Trop wnl. No CP or SOA. Doubt cardiac etiology given very atypical presentation. No signs SBO. Continues to tolerate PO. Infectious etiologies higher on differential given persistent diarrhea. Hepatitis panel and acetaminophen level ordered. Reports history of chronic diarrhea.  No change in diet or sick exposures. IV started and will give IVFs and reeval.  Spoke with family medicine who will admit for further observation and work-up.  Ultrasound and hepatitis panel/acetaminophen pending at time of transfer.  Stable in ED with no acute events.  Case and plan of care discussed with Dr. Sabra Heck.  Final Clinical Impressions(s) / ED Diagnoses   Final diagnoses:  Abdominal pain, RUQ  Acute renal failure superimposed on chronic kidney disease, unspecified CKD stage, unspecified acute renal failure type ([place])  Diarrhea in adult patient  Transaminitis    ED Discharge Orders    None       Corrie Dandy, MD 04/30/18 2011    Noemi Chapel, MD 05/01/18 1050

## 2018-04-30 NOTE — ED Triage Notes (Signed)
Pt in via EMS c/o upper abdominal pain, pain is worse with movement, denies n/v but reports diarrhea, CBG 220, no distress noted

## 2018-04-30 NOTE — ED Provider Notes (Signed)
I saw and evaluated the patient, reviewed the resident's note and I agree with the findings and plan.  Pertinent History: no hx of liver Dx - has had 4 days of voluminous watery diarrhea with abd cramping - she is not vomiting -   Pertinent Exam findings: on exam has mobesity - has mild mid abd ttp and upper abd.  No guarding, no peritoneal signs.    Has multiple abnormal labs - including AKI and LFT's - likely dehdyrated,   I personally interpreted the EKG as well as the resident and agree with the interpretation on the resident's chart.  Final diagnoses:  Abdominal pain, RUQ  Acute renal failure superimposed on chronic kidney disease, unspecified CKD stage, unspecified acute renal failure type ([place])  Diarrhea in adult patient  Transaminitis      Noemi Chapel, MD 05/01/18 1050

## 2018-04-30 NOTE — ED Notes (Signed)
Pt was unable to provide ua sample

## 2018-04-30 NOTE — ED Provider Notes (Signed)
Patient placed in Quick Look pathway, seen and evaluated   Chief Complaint: diarrhea  HPI:   Watery non bloody diarrhea since Saturday up to 20 times daily.  H/o daily diarrhea takes imodium for but not this heavy or frequent. Associated with epigastric abdominal pain feels like "soreness" and "bowling bowl" worse with palpation, sitting up, walking.  No recent travel, antibiotics, h/o c. Diff. Has been eating more dried cranberries.   ROS: negative for fevers, chills, nausea, vomiting, SOB, melena, hematochezia.   Physical Exam:   Gen: No distress  Neuro: Awake and Alert  Skin: Warm    Focused Exam: non toxic. RRR. Lungs CTAB. Moderate epigastric and RUQ tenderness.    Initiation of care has begun. The patient has been counseled on the process, plan, and necessity for staying for the completion/evaluation, and the remainder of the medical screening examination    Tamara Henry 04/30/18 [place], MD 04/30/18 [phone]

## 2018-04-30 NOTE — Progress Notes (Signed)
Family medicine in to see pt.

## 2018-05-01 ENCOUNTER — Ambulatory Visit: Payer: Medicare Other | Admitting: Physical Therapy

## 2018-05-01 ENCOUNTER — Other Ambulatory Visit: Payer: Self-pay | Admitting: *Deleted

## 2018-05-01 DIAGNOSIS — N179 Acute kidney failure, unspecified: Secondary | ICD-10-CM

## 2018-05-01 DIAGNOSIS — R740 Nonspecific elevation of levels of transaminase and lactic acid dehydrogenase [LDH]: Secondary | ICD-10-CM | POA: Diagnosis not present

## 2018-05-01 DIAGNOSIS — R197 Diarrhea, unspecified: Secondary | ICD-10-CM | POA: Diagnosis not present

## 2018-05-01 DIAGNOSIS — N189 Chronic kidney disease, unspecified: Secondary | ICD-10-CM

## 2018-05-01 DIAGNOSIS — R1011 Right upper quadrant pain: Secondary | ICD-10-CM

## 2018-05-01 DIAGNOSIS — R1013 Epigastric pain: Secondary | ICD-10-CM

## 2018-05-01 LAB — COMPREHENSIVE METABOLIC PANEL
ALBUMIN: 2.8 g/dL — AB (ref 3.5–5.0)
ALT: 230 U/L — ABNORMAL HIGH (ref 0–44)
ANION GAP: 7 (ref 5–15)
AST: 221 U/L — AB (ref 15–41)
Alkaline Phosphatase: 191 U/L — ABNORMAL HIGH (ref 38–126)
BILIRUBIN TOTAL: 1 mg/dL (ref 0.3–1.2)
BUN: 25 mg/dL — AB (ref 6–20)
CHLORIDE: 107 mmol/L (ref 98–111)
CO2: 24 mmol/L (ref 22–32)
Calcium: 8.2 mg/dL — ABNORMAL LOW (ref 8.9–10.3)
Creatinine, Ser: 1.82 mg/dL — ABNORMAL HIGH (ref 0.44–1.00)
GFR calc Af Amer: 35 mL/min — ABNORMAL LOW (ref >60)
GFR calc non Af Amer: 30 mL/min — ABNORMAL LOW (ref >60)
GLUCOSE: 214 mg/dL — AB (ref 70–99)
POTASSIUM: 3.7 mmol/L (ref 3.5–5.1)
SODIUM: 138 mmol/L (ref 135–145)
TOTAL PROTEIN: 6.4 g/dL — AB (ref 6.5–8.1)

## 2018-05-01 LAB — CBC
HEMATOCRIT: 39 % (ref 36.0–46.0)
HEMOGLOBIN: 12.2 g/dL (ref 12.0–15.0)
MCH: 29.4 pg (ref 26.0–34.0)
MCHC: 31.3 g/dL (ref 30.0–36.0)
MCV: 94 fL (ref 78.0–100.0)
Platelets: 176 10*3/uL (ref 150–400)
RBC: 4.15 MIL/uL (ref 3.87–5.11)
RDW: 12.8 % (ref 11.5–15.5)
WBC: 9.2 10*3/uL (ref 4.0–10.5)

## 2018-05-01 LAB — C DIFFICILE QUICK SCREEN W PCR REFLEX
C Diff antigen: NEGATIVE
C Diff interpretation: NOT DETECTED
C Diff toxin: NEGATIVE

## 2018-05-01 LAB — GLUCOSE, CAPILLARY
GLUCOSE-CAPILLARY: 190 mg/dL — AB (ref 70–99)
GLUCOSE-CAPILLARY: 116 mg/dL — AB (ref 70–99)
Glucose-Capillary: 223 mg/dL — ABNORMAL HIGH (ref 70–99)
Glucose-Capillary: 204 mg/dL — ABNORMAL HIGH (ref 70–99)

## 2018-05-01 LAB — HEPATITIS PANEL, ACUTE
HCV Ab: <0.1 s/co ratio (ref 0.0–0.9)
HEP B C IGM: NEGATIVE
Hep A IgM: NEGATIVE
Hepatitis B Surface Ag: NEGATIVE

## 2018-05-01 LAB — LACTIC ACID, PLASMA
LACTIC ACID, VENOUS: 1.3 mmol/L (ref 0.5–1.9)
Lactic Acid, Venous: 1.6 mmol/L (ref 0.5–1.9)

## 2018-05-01 LAB — OCCULT BLOOD X 1 CARD TO LAB, STOOL: FECAL OCCULT BLD: NEGATIVE

## 2018-05-01 LAB — TROPONIN I

## 2018-05-01 LAB — GAMMA GT: GGT: 227 U/L — AB (ref 7–50)

## 2018-05-01 LAB — HEMOGLOBIN A1C
HEMOGLOBIN A1C: 7.9 % — AB (ref 4.8–5.6)
MEAN PLASMA GLUCOSE: 180.03 mg/dL

## 2018-05-01 MED ORDER — ACETAMINOPHEN 650 MG RE SUPP: 325.0000 mg | RECTAL | Status: DC | PRN

## 2018-05-01 MED ORDER — ACETAMINOPHEN 500 MG PO TABS
500.0000 mg | ORAL_TABLET | ORAL | Status: DC | PRN
  Administered 2018-05-01: 500 mg via ORAL

## 2018-05-01 NOTE — Progress Notes (Signed)
Family Medicine Teaching Service Daily Progress Note Intern Pager: [phone]  Patient name: Tamara Henry Medical record number: [id number] Date of birth: 11/06/1960 Age: 57 y.o. Gender: female  Primary Care Provider: Kathrene Alu, MD Consultants: None  Code Status: Full  Pt Overview and Major Events to Date:  8/20 admitted  Assessment and Plan: Carmita Boom is a 57 y.o. female presenting with abdominal pain, subsequently found to have transaminitis and AKI. PMH is significant for CKD, hypertension, previous drug use, MI s/p 4 vessel CABG, hyperlipidemia, asthma, diabetes mellitus, OSA, and GERD.   Abdominal pain with acute liver injury:  Patient reports abdominal "bowling ball" sensation is unchanged. Had 5 episodes of watery stools overnight, however more formed than previous. Tolerated dinner well. Remains afebrile and hemodynamically stable. On physical exam, continues to have epigastric tenderness, in additional to now LUQ tenderness. RUQ ultrasound 8/20 wnl. Acute hepatitis panel negative. Troponin negative x3. AST and ALT trending down, 221 and 230 from 300, 263 respectively. Alk phos stable at 191. Bilirubin wnl. GGT elevated. Etiology unknown, consideration for infectious vs medication induced.  -Vitals per routine   -Cont mIVF NS 75 ml/hr -Obtain fecal GI panel  -C Diff antigen  -Obtain HIV and EBV titers  -Monitor CBC and CMP in the am   Acute on chronic kidney disease: Improving, Cr 1.82 today, 2.21 on admission. Baseline around 1.5.  Follows with [facility] Likely secondary to dehydration in the setting of recent excessive diarrhea vs hepatorenal syndrome. Will continue light hydration.  -Monitor CMP in the am   -Avoid nephrotoxic medications  -Cont mIVF NS 75 ml/hr   Diabetes Mellitus with neuropathy: A1c 7.8 in 04/2018. Glucose 130-200 overnight. Takes gabapentin 368m, Lantus 50 units nightly, and Humalog 5 units BID at home.   -SSI  -Monitor glucose    Hypertension: Generally normotensive overnight. Takes Amlodipine 10 mg, Carvedilol 12.557mBID, and olmesartan 20 mg at home for control.  -Cont amlodipine and carvedilol -D/c home olmesartan in setting of AKI   Hyperlipidemia: Lipid panel in 01/2018 wnl. Takes Atorvastatin 80 mg at home.  -Hold home atorvastatin in setting of transaminitis   NSTEMI S/p CABG: In 07/2016. Not currently endorsing any chest pain. [facility] with [facility].  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 Denies any other illicit drug use. Also quit smoking cigarettes 12 years ago, with an approximate 20 pack year history. Currently living at a group home.  -UDS pending   Asthma: Not currently endorsing dyspnea. Lungs clear on exam without increased WOB. Takes Albuterol as needed at home, but has not needed in months.  -Albuterol as needed   OSA: Continue home CPAP   GERD: Takes omeprazole 4026mt home. States this current pain is different from her usual heartburn.  -pantoprazole 26m30mily    Chronic Depression: Endorses a lot of stressors at her group home, but denies SI/HI.  -D/c home citalopram given potential SE of liver injury when associated used with PPI    FEN/GI: Heart heathy, IV in place, mIVF NS 75ml46mProphylaxis: Lovenox   Disposition: Continued inpatient care for AKI and transaminitis   Subjective:  No acute events overnight. Tolerated dinner yesterday well.  States her abdominal pain is unchanged.  Had about 5 episodes of diarrhea last night, however notes it is slightly more formed.  Denies melena or hematochezia.  No nausea or vomiting. She does also endorse coughing with clear sputum production, worse at night.   Objective: Temp:  [97.8 F (36.6 C)-98.7 F (37.1 C)] 97.8 F (36.6 C) (08/21 0520) Pulse Rate:  [70-78]  74 (08/21 0520) Resp:  [16-17] 16 (08/21 0520) BP: (111-152)/(66-78) 139/70 (08/21 0520) SpO2:  [96 %-99 %] 96 % (08/21 0520) Weight:  [103.6 kg] 103.6 kg (08/20 1837) Physical Exam: General: Alert, NAD HEENT: NCAT, MMM, oropharynx nonerythematous, sclera clear today  Cardiac: RRR no m/g/r Lungs: Clear bilaterally, no increased WOB  Abdomen: soft, non-distended, normoactive BS, tender to epigastrium and LUQ. No fluid wave or shifting dullness noted.  Msk: Moves all extremities spontaneously  Ext: Warm, dry, 2+ distal pulses, no edema    Laboratory: Recent Labs  Lab 04/30/18 1407 05/01/18 0022  WBC 8.5 9.2  HGB 12.9 12.2  HCT 41.3 39.0  PLT 194 176   Recent Labs  Lab 04/30/18 1407 05/01/18 0022  NA 135 138  K 4.2 3.7  CL 104 107  CO2 22 24  BUN 29* 25*  CREATININE 2.21* 1.82*  CALCIUM 8.4* 8.2*  PROT 6.6 6.4*  BILITOT 1.2 1.0  ALKPHOS 188* 191*  ALT 263* 230*  AST 300* 221*  GLUCOSE 190* 214*     Imaging/Diagnostic Tests: *US Abdomen Limited Ruq  Result Date: 04/30/2018 CLINICAL DATA:  RIGHT upper quadrant pain for 3 days EXAM: ULTRASOUND ABDOMEN LIMITED RIGHT UPPER QUADRANT COMPARISON:  None FINDINGS: Gallbladder: Normally distended without stones or wall thickening. No pericholecystic fluid or sonographic Murphy sign. Common bile duct: Diameter: Normal caliber 4 mm diameter Liver: Upper normal cortical echogenicity. No discrete hepatic mass or nodularity. Portal vein is patent on color Doppler imaging with normal direction of blood flow towards the liver. No RIGHT upper quadrant free fluid. IMPRESSION: Normal exam. Electronically Signed   By: Lavonia Dana M.D.   On: 04/30/2018 20:24    Patriciaann Clan, DO 05/01/2018, 6:43 AM PGY-1, [place] Intern pager: [phone], text pages welcome

## 2018-05-01 NOTE — Patient Outreach (Signed)
[facility]) Care Management  05/01/2018  Tamara Henry Jul 10, 1961 [id number]   RN Health Coach Inpatient Hospitalization  Referral Date:  04/17/2018 Referral Source:  EMMI Prevent Screening Reason for Referral:  Disease Management Education, Medication compliance packaging Insurance:  [employer]   Outreach Attempt:  Received notification patient admitted to [facility] for diarrhea and acute on chronic renal failure.  RN Health Coach will notify [facility] Liaison of patient's admission to assess for discharge needs.  Plan:  RN Health Coach will await recommendations from [place] for discharge recommendations.   [name] [phone] [email or web address]

## 2018-05-02 ENCOUNTER — Observation Stay (HOSPITAL_COMMUNITY): Payer: Medicare Other

## 2018-05-02 DIAGNOSIS — N189 Chronic kidney disease, unspecified: Secondary | ICD-10-CM

## 2018-05-02 DIAGNOSIS — R740 Nonspecific elevation of levels of transaminase and lactic acid dehydrogenase [LDH]: Secondary | ICD-10-CM

## 2018-05-02 DIAGNOSIS — N179 Acute kidney failure, unspecified: Secondary | ICD-10-CM | POA: Diagnosis not present

## 2018-05-02 DIAGNOSIS — R1011 Right upper quadrant pain: Secondary | ICD-10-CM

## 2018-05-02 DIAGNOSIS — R1013 Epigastric pain: Secondary | ICD-10-CM

## 2018-05-02 DIAGNOSIS — R05 Cough: Secondary | ICD-10-CM | POA: Diagnosis not present

## 2018-05-02 LAB — CBC WITH DIFFERENTIAL/PLATELET
Abs Immature Granulocytes: 0 10*3/uL (ref 0.0–0.1)
BASOS ABS: 0 10*3/uL (ref 0.0–0.1)
Basophils Relative: 0 %
EOS ABS: 0.1 10*3/uL (ref 0.0–0.7)
Eosinophils Relative: 2 %
HEMATOCRIT: 37.2 % (ref 36.0–46.0)
Hemoglobin: 11.7 g/dL — ABNORMAL LOW (ref 12.0–15.0)
IMMATURE GRANULOCYTES: 0 %
LYMPHS ABS: 2.1 10*3/uL (ref 0.7–4.0)
LYMPHS PCT: 38 %
MCH: 29.8 pg (ref 26.0–34.0)
MCHC: 31.5 g/dL (ref 30.0–36.0)
MCV: 94.9 fL (ref 78.0–100.0)
Monocytes Absolute: 0.4 10*3/uL (ref 0.1–1.0)
Monocytes Relative: 8 %
NEUTROS PCT: 52 %
Neutro Abs: 2.9 10*3/uL (ref 1.7–7.7)
Platelets: 169 10*3/uL (ref 150–400)
RBC: 3.92 MIL/uL (ref 3.87–5.11)
RDW: 12.9 % (ref 11.5–15.5)
WBC: 5.6 10*3/uL (ref 4.0–10.5)

## 2018-05-02 LAB — GASTROINTESTINAL PANEL BY PCR, STOOL (REPLACES STOOL CULTURE)

## 2018-05-02 LAB — COMPREHENSIVE METABOLIC PANEL
ALT: 187 U/L — ABNORMAL HIGH (ref 0–44)
AST: 113 U/L — ABNORMAL HIGH (ref 15–41)
Albumin: 2.5 g/dL — ABNORMAL LOW (ref 3.5–5.0)
Alkaline Phosphatase: 219 U/L — ABNORMAL HIGH (ref 38–126)
Anion gap: 4 — ABNORMAL LOW (ref 5–15)
BUN: 19 mg/dL (ref 6–20)
CHLORIDE: 110 mmol/L (ref 98–111)
CO2: 27 mmol/L (ref 22–32)
CREATININE: 1.43 mg/dL — AB (ref 0.44–1.00)
Calcium: 7.8 mg/dL — ABNORMAL LOW (ref 8.9–10.3)
GFR, EST AFRICAN AMERICAN: 47 mL/min — AB (ref >60)
GFR, EST NON AFRICAN AMERICAN: 41 mL/min — AB (ref >60)
Glucose, Bld: 188 mg/dL — ABNORMAL HIGH (ref 70–99)
POTASSIUM: 3.9 mmol/L (ref 3.5–5.1)
Sodium: 141 mmol/L (ref 135–145)
TOTAL PROTEIN: 5.8 g/dL — AB (ref 6.5–8.1)
Total Bilirubin: 0.5 mg/dL (ref 0.3–1.2)

## 2018-05-02 LAB — GLUCOSE, CAPILLARY
GLUCOSE-CAPILLARY: 258 mg/dL — AB (ref 70–99)
GLUCOSE-CAPILLARY: 128 mg/dL — AB (ref 70–99)
Glucose-Capillary: 217 mg/dL — ABNORMAL HIGH (ref 70–99)

## 2018-05-02 LAB — HEPATITIS B SURFACE ANTIBODY, QUANTITATIVE: HEPATITIS B-POST: 7.8 m[IU]/mL — AB

## 2018-05-02 LAB — MONONUCLEOSIS SCREEN: MONO SCREEN: NEGATIVE

## 2018-05-02 LAB — HIV ANTIBODY (ROUTINE TESTING W REFLEX): HIV SCREEN 4TH GENERATION: NONREACTIVE

## 2018-05-02 MED ORDER — ACETAMINOPHEN 650 MG RE SUPP: 325.0000 mg | RECTAL | Status: DC | PRN

## 2018-05-02 MED ORDER — ACETAMINOPHEN 325 MG PO TABS
325.0000 mg | ORAL_TABLET | ORAL | Status: DC | PRN
  Administered 2018-05-02 – 2018-05-03 (×2): 325 mg via ORAL
  Filled 2018-05-02 (×2): qty 1

## 2018-05-02 MED ORDER — INSULIN GLARGINE 100 UNIT/ML ~~LOC~~ SOLN
10.0000 [IU] | Freq: Every day | SUBCUTANEOUS | Status: DC
  Administered 2018-05-02: 10 [IU] via SUBCUTANEOUS
  Filled 2018-05-02 (×2): qty 0.1

## 2018-05-02 NOTE — Care Management Obs Status (Signed)
[place] NOTIFICATION   Patient Details  Name: Tamara Henry MRN: [id number] Date of Birth: [birth year]   Medicare Observation Status Notification Given:  Yes    Royston Bake, RN 05/02/2018, 3:29 PM

## 2018-05-02 NOTE — Care Management Note (Signed)
Case Management Note  Patient Details  Name: Tamara Henry MRN: [id number] Date of Birth: 24-Apr-1961  Subjective/Objective:   Abdominal Pain, AKI                Action/Plan: Patient lives in group homePrimary Care Provider: Kathrene Alu, MD; has private insurance with [facility] with prescription drug coverage; CM will continue to follow for progression of care.  Expected Discharge Date:   possibly 05/07/2018               Expected Discharge Plan:   Group Home  In-House Referral:   SW  Status of Service:   In progress  Sherrilyn Rist [phone] 05/02/2018, 2:02 PM

## 2018-05-02 NOTE — Progress Notes (Signed)
Family Medicine Teaching Service Daily Progress Note Intern Pager: [phone]  Patient name: Tamara Henry Medical record number: [id number] Date of birth: [birth year] Age: 57 y.o. Gender: female  Primary Care Provider: Kathrene Alu, MD Consultants: None  Code Status: Full   Assessment and Plan: Tamara Henry a 57 y.o.femalepresenting with abdominal pain, subsequently found to have transaminitis and AKI. PMH is significant forCKD, hypertension, previous drug use, MI s/p 4 vessel CABG, hyperlipidemia, asthma, diabetes mellitus, OSA, and GERD.  Abdominal pain with acute liver injury:Abdominal pain improved, no longer feels like a "bowling ball" in her epigastrium. Stool more formed, at her baseline. Remains afebrile and hemodynamically stable. Physical exam notable for tenderness to LUQ and epigastrium. RUQ ultrasound 8/20 wnl. AST and ALT trending down, 113 and 187 from 221, 230 respectively. Alk phos stable at 219, slightly elevated from yesterday. Bilirubin wnl. GGT elevated. HIV and C-diff negative, GI stool panel and EBV pending. Etiology uncertain, consideration for infectious vs medication induced, d/c'd atorvastatin and Celexa currently.  -Vitals per routine -Obtain an complete abdomen U/S; with consideration for CT abdomen if necessary  -Continue mIVF normal saline 75 mL/hr -Follow-up fecal GI panel and EBV titers -Monitor CBC and CMP in the a.m.  Productive Cough: Pt continues to have a productive cough with clear sputum, worse at night and after meals, unchanged during her stay. Has had this chronically, but felt it started increasing this past Saturday. Likely related to GERD, however will obtain CXR.  -CXR  Acute on chronic kidney disease: Improved, Cr 1.43 today, 1.82 yesterday. Baseline around 1.5. Follows with [facility]Likely was secondary to dehydration in the setting of recent excessive diarrhea vs hepatorenal syndrome. Will continue light hydration.   -Monitor CMP in the am  -Avoid nephrotoxic medications  -Cont mIVF NS 75 ml/hr  Diabetes Mellitus with neuropathy:A1c 7.8 in 04/2018. Glucose 130-200 overnight, required 8 units of SSI. Takesgabapentin '300mg'$ ,Lantus 50 units nightly,andHumalog 5 units BIDat home. -Moderate SSI  -Add lantus 10 units  -Monitor glucose  Hypertension:Generally normotensive overnight. TakesAmlodipine 10 mg, Carvedilol 12.'5mg'$  BID,andolmesartan 20 mgat home for control.  -Cont amlodipine and carvedilol -Cont holding home olmesartan given normotensive   Hyperlipidemia:Lipid panel in 01/2018 wnl. Takes Atorvastatin 80 mgat home.  -Hold home atorvastatin in setting of transaminitis   NSTEMIS/p CABG:In 07/2016. Not currently endorsing any chest pain. Follows with [facility].  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 Denies any other illicit drug use. Also quit smoking cigarettes 12 years ago, with an approximate 20 pack year history. Currently living at a group home.  -UDSpending   Asthma:Not currently endorsing dyspnea. Lungs clear on exam without increased WOB. TakesAlbuterolas needed at home, but has not needed in months.  -Albuterol as needed  FTD:DUKGURKY homeCPAP   GERD:Takesomeprazole '40mg'$ at home.  -pantoprazole '40mg'$  daily   Chronic Depression:Endorses a lot of stressors at her group home, but denies SI/HI.  -D/c home citalopramgiven potential SE of liver injury when associated used with PPI    FEN/GI:Heart heathy, IV in place, mIVF NS 61m/hr Prophylaxis:Lovenox  Disposition: Further abdomen evaluation   Subjective:  No acute events overnight.  Tolerating her regular diet well.  Had 3 bowel movements overnight, more formed and at her baseline stool.  Abdominal pain still present, but  improving, states she notices it more when she sits up or when  she is walking.  Also continues to have coughing with clear sputum production at night.  Objective: Temp:  [97.9 F (36.6 C)-98.4 F (36.9 C)] 97.9 F (36.6 C) (08/22 0519) Pulse Rate:  [66-73] 66 (08/22 0519) Resp:  [18] 18 (08/22 0519) BP: (123-127)/(61-64) 127/64 (08/22 0519) SpO2:  [98 %-99 %] 99 % (08/22 0519) Physical Exam: General: Alert, NAD HEENT: NCAT, MMM, oropharynx nonerythematous  Cardiac: RRR no m/g/r Lungs: Clear bilaterally, no increased WOB  Abdomen: soft large abdomen, tender to LUQ and epigastrium, non-distended, normoactive BS  Msk: Moves all extremities spontaneously  Ext: Warm, dry, 2+ distal pulses, no edema   Laboratory: Recent Labs  Lab 04/30/18 1407 05/01/18 0022  WBC 8.5 9.2  HGB 12.9 12.2  HCT 41.3 39.0  PLT 194 176   Recent Labs  Lab 04/30/18 1407 05/01/18 0022  NA 135 138  K 4.2 3.7  CL 104 107  CO2 22 24  BUN 29* 25*  CREATININE 2.21* 1.82*  CALCIUM 8.4* 8.2*  PROT 6.6 6.4*  BILITOT 1.2 1.0  ALKPHOS 188* 191*  ALT 263* 230*  AST 300* 221*  GLUCOSE 190* 214*     Imaging/Diagnostic Tests: No results found.  Patriciaann Clan, DO 05/02/2018, 6:20 AM PGY-1, [place] Intern pager: [phone], text pages welcome

## 2018-05-02 NOTE — Care Management CC44 (Signed)
Condition Code 44 Documentation Completed  Patient Details  Name: Tamara Henry MRN: [id number] Date of Birth: November 30, 1960   Condition Code 44 given:  Yes Patient signature on Condition Code 44 notice:  Yes Documentation of 2 MD's agreement:  Yes Code 44 added to claim:  Yes    Royston Bake, RN 05/02/2018, 3:29 PM

## 2018-05-02 NOTE — Social Work (Addendum)
CSW acknowledging consult for pt, aware she is from [place]. Will follow for disposition as pt nears medical stability.   3:33pm- Upon record review pt is not from group home, lives in boarding house in [place] where she currently rents a room.   Alexander Mt, [place] Work [phone]

## 2018-05-03 ENCOUNTER — Ambulatory Visit: Payer: Medicare Other | Admitting: Physical Therapy

## 2018-05-03 ENCOUNTER — Observation Stay (HOSPITAL_COMMUNITY): Payer: Medicare Other

## 2018-05-03 DIAGNOSIS — R945 Abnormal results of liver function studies: Secondary | ICD-10-CM | POA: Diagnosis not present

## 2018-05-03 DIAGNOSIS — R197 Diarrhea, unspecified: Secondary | ICD-10-CM | POA: Diagnosis not present

## 2018-05-03 DIAGNOSIS — N179 Acute kidney failure, unspecified: Secondary | ICD-10-CM | POA: Diagnosis not present

## 2018-05-03 DIAGNOSIS — R740 Nonspecific elevation of levels of transaminase and lactic acid dehydrogenase [LDH]: Secondary | ICD-10-CM | POA: Diagnosis not present

## 2018-05-03 LAB — EPSTEIN-BARR VIRUS VCA ANTIBODY PANEL
EBV NA IgG: 191 U/mL — ABNORMAL HIGH (ref 0.0–17.9)
EBV VCA IGG: 364 U/mL — AB (ref 0.0–17.9)

## 2018-05-03 LAB — CBC WITH DIFFERENTIAL/PLATELET
ABS IMMATURE GRANULOCYTES: 0 10*3/uL (ref 0.0–0.1)
BASOS ABS: 0 10*3/uL (ref 0.0–0.1)
Basophils Relative: 0 %
EOS PCT: 2 %
Eosinophils Absolute: 0.1 10*3/uL (ref 0.0–0.7)
HCT: 35.4 % — ABNORMAL LOW (ref 36.0–46.0)
HEMOGLOBIN: 10.9 g/dL — AB (ref 12.0–15.0)
Immature Granulocytes: 0 %
LYMPHS PCT: 37 %
Lymphs Abs: 2.3 10*3/uL (ref 0.7–4.0)
MCH: 29.5 pg (ref 26.0–34.0)
MCHC: 30.8 g/dL (ref 30.0–36.0)
MCV: 95.7 fL (ref 78.0–100.0)
Monocytes Absolute: 0.4 10*3/uL (ref 0.1–1.0)
Monocytes Relative: 7 %
Neutro Abs: 3.3 10*3/uL (ref 1.7–7.7)
Neutrophils Relative %: 54 %
PLATELETS: 170 10*3/uL (ref 150–400)
RBC: 3.7 MIL/uL — AB (ref 3.87–5.11)
RDW: 12.8 % (ref 11.5–15.5)
WBC: 6.1 10*3/uL (ref 4.0–10.5)

## 2018-05-03 LAB — COMPREHENSIVE METABOLIC PANEL
ALT: 148 U/L — ABNORMAL HIGH (ref 0–44)
ANION GAP: 5 (ref 5–15)
AST: 77 U/L — ABNORMAL HIGH (ref 15–41)
Albumin: 2.4 g/dL — ABNORMAL LOW (ref 3.5–5.0)
Alkaline Phosphatase: 216 U/L — ABNORMAL HIGH (ref 38–126)
BILIRUBIN TOTAL: 0.5 mg/dL (ref 0.3–1.2)
BUN: 15 mg/dL (ref 6–20)
CO2: 25 mmol/L (ref 22–32)
Calcium: 8.1 mg/dL — ABNORMAL LOW (ref 8.9–10.3)
Chloride: 112 mmol/L — ABNORMAL HIGH (ref 98–111)
Creatinine, Ser: 1.24 mg/dL — ABNORMAL HIGH (ref 0.44–1.00)
GFR calc Af Amer: 56 mL/min — ABNORMAL LOW (ref >60)
GFR, EST NON AFRICAN AMERICAN: 48 mL/min — AB (ref >60)
Glucose, Bld: 152 mg/dL — ABNORMAL HIGH (ref 70–99)
POTASSIUM: 4.1 mmol/L (ref 3.5–5.1)
Sodium: 142 mmol/L (ref 135–145)
TOTAL PROTEIN: 5.5 g/dL — AB (ref 6.5–8.1)

## 2018-05-03 LAB — GLUCOSE, CAPILLARY
GLUCOSE-CAPILLARY: 161 mg/dL — AB (ref 70–99)
GLUCOSE-CAPILLARY: 178 mg/dL — AB (ref 70–99)

## 2018-05-03 MED ORDER — OMEPRAZOLE 40 MG PO CPDR: 40.0000 mg | DELAYED_RELEASE_CAPSULE | Freq: Two times a day (BID) | ORAL | 0 refills | Status: DC

## 2018-05-03 NOTE — Consult Note (Signed)
   [facility] Inpatient Consult   05/03/2018  [name] 1960/12/27 [id number]    [facility] Care Management is active with Tamara Henry. She is followed by [place] and [facility] Pharmacist. Please see chart review tab then encounters for patient outreach details.  Met with [name] at bedside to discuss ongoing [place] Management services. She remains agreeable and [facility] Care Management written consent obtained. [facility] folder provided as well.  Will update [facility] Care Management team.   Notification sent to inpatient RNCM to make aware [facility] is active.   Marthenia Rolling, MSN-Ed, RN,BSN [facility] Liaison [phone]

## 2018-05-03 NOTE — Social Work (Signed)
CSW met with pt at bedside, introduced self and reason for visit. Pt states she is unsure when her daughter will be able to pick her up after work. CSW able to provide pt with two buses. Pt grateful, no further concerns discussed. Pt ready for discharge.  CSW signing off. Please consult if any additional needs arise.  Alexander Mt, [place] Work [phone]

## 2018-05-03 NOTE — Discharge Instructions (Signed)
You were admitted to [facility] for increasing abdominal pain and diarrhea.  A complete abdominal ultrasound was completed and normal.  Your liver enzymes and kidney labs slowly improved throughout your stay.  We are going to decrease you taking omeprazole to twice a day, instead of 3 times a day with meals.  Please make sure you follow-up with your primary care provider in the next 1 week or so.  Sit upright after meals and avoid spicy acidic foods.  Please seek medical care if your abdominal pain acutely worsens, have a persistent fever > 100.4, or  intractable nausea and vomiting.  So glad you are feeling better!

## 2018-05-03 NOTE — Progress Notes (Signed)
Family Medicine Teaching Service Daily Progress Note Intern Pager: [phone]  Patient name: Tamara Henry Medical record number: [id number] Date of birth: 06-21-1961 Age: 57 y.o. Gender: female  Primary Care Provider: Kathrene Alu, MD Consultants: None  Code Status: Full   Assessment and Plan: Lashawnda Hancox a 57 y.o.femalepresenting with abdominal pain, subsequently found to have transaminitis and AKI. PMH is significant forCKD, hypertension, previous drug use, MI s/p 4 vessel CABG, hyperlipidemia, asthma, diabetes mellitus, OSA, and GERD.  Abdominal pain with acute liver injury:Improving, pain worse during meals, walking, and with movement. Pt endorses she was taking Protonix TID before meals at home prior to admission for reflux. Hemodynamically stable. Continues to have LUQ and epigastric pain, but improving. Alk phos still elevated at 216 (219 yest). AST and ALT trending down, 77 and 148. GI fecal panel and Monospot negative.Complete abdomen ultrasound normal.  Etiology uncertain, consideration for medication induced given excessive use of prontonix, vs infectious vs PUD.  - DC fluids - Follow-up EBV titers --Taper home protonix use   Productive Cough: Worse this morning, continues to have cough with clear sputum. Lungs are clear. Likely related to GERD. -cont to monitor   Acute on chronic kidney disease:Resolved, Cr at 1.2, baseline of 1.5. Follows with [facility] -Monitor CMP in the am  -Avoid nephrotoxic medications   Diabetes Mellitus with neuropathy:A1c 7.8 in 04/2018.  Glucose ranging 100-200's.Takesgabapentin 362m,Lantus 50 units nightly,andHumalog 5 units BIDat home. -Moderate SSI  -Cont lantus 10 units  -Monitor glucose  Hypertension:Systolic BP ranging around 120-140s. TakesAmlodipine 10 mg, Carvedilol 12.539mBID,andolmesartan 20 mgat home for control.  -Contamlodipine and carvedilol -Cont holding home olmesartan given  normotensive   Hyperlipidemia:Lipid panel in 01/2018 wnl. Takes Atorvastatin 80 mgat home.  -Hold home atorvastatin in setting of transaminitis   NSTEMIS/p CABG:In 07/2016. Not currently endorsing any chest pain. Follows with [facility].  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 Denies any other illicit drug use. Also quit smoking cigarettes 12 years ago, with an approximate 20 pack year history. Currently living at a group home.  -UDSpending  Asthma: TakesAlbuterolas needed at home, but has not needed in months.  -Albuterol as needed  OSKAJ:GOTLXBWIomeCPAP   GERD:Takesomeprazole 4059m home.  -pantoprazole 9m75mily   Chronic Depression:Endorses a lot of stressors at her group home, but denies SI/HI.  -Holding home citalopraml; however will restart at d/c will appropriate f/u    FEN/GI: Heart healthy diet, IV in place  PPx: Lovenox   Disposition: D/c this afternoon with PPI discussion and follow up   Subjective:  Abdominal pain is improving, notes it is worse with meals, movement, or walking.  She states she has been taking Protonix 3 times a day before meals that was helping with her reflux at home.  Denies nausea, vomiting.  Bowel movements at baseline consistency, however having them more frequently.  Objective: Temp:  [97.5 F (36.4 C)-98.1 F (36.7 C)] 98.1 F (36.7 C) (08/22 2118) Pulse Rate:  [66-72] 66 (08/22 2118) Resp:  [14-18] 14 (08/22 2118) BP: (142-143)/(69-75) 143/69 (08/22 2118) SpO2:  [100 %] 100 % (08/22 2118) Physical Exam: General: Alert, NAD HEENT: NCAT, MMM, oropharynx nonerythematous  Cardiac: RRR no m/g/r Lungs: Clear bilaterally, no increased WOB  Abdomen: soft, tender to left upper quadrant and epigastrium, non-distended, normoactive BS Msk: Moves all extremities  spontaneously  Ext: Warm, dry, 2+ distal pulses, no edema    Laboratory: Recent Labs  Lab 04/30/18 1407 05/01/18 0022 05/02/18 0656  WBC 8.5 9.2 5.6  HGB 12.9 12.2 11.7*  HCT 41.3 39.0 37.2  PLT 194 176 169   Recent Labs  Lab 04/30/18 1407 05/01/18 0022 05/02/18 0656  NA 135 138 141  K 4.2 3.7 3.9  CL 104 107 110  CO2 22 24 27   BUN 29* 25* 19  CREATININE 2.21* 1.82* 1.43*  CALCIUM 8.4* 8.2* 7.8*  PROT 6.6 6.4* 5.8*  BILITOT 1.2 1.0 0.5  ALKPHOS 188* 191* 219*  ALT 263* 230* 187*  AST 300* 221* 113*  GLUCOSE 190* 214* 188*    Imaging/Diagnostic Tests: Dg Chest 2 View  Result Date: 05/02/2018 CLINICAL DATA:  Cough and epigastric pain EXAM: CHEST - 2 VIEW COMPARISON:  07/28/2017 FINDINGS: Median sternotomy. Heart size within normal limits. Negative for heart failure. Lungs are clear without infiltrate or effusion. IMPRESSION: No active cardiopulmonary disease. Electronically Signed   By: Franchot Gallo M.D.   On: 05/02/2018 15:09    Patriciaann Clan, DO 05/03/2018, 6:22 AM PGY-1, [place] Intern pager: [phone], text pages welcome

## 2018-05-03 NOTE — Progress Notes (Signed)
[name] to be D/C'd  per MD order. Discussed with the patient and all questions fully answered.  VSS, Skin clean, dry and intact without evidence of skin break down, no evidence of skin tears noted.  IV catheter discontinued intact. Site without signs and symptoms of complications. Dressing and pressure applied.  An After Visit Summary was printed and given to the patient. Patient received prescription.  D/c education completed with patient/family including follow up instructions, medication list, d/c activities limitations if indicated, with other d/c instructions as indicated by MD - patient able to verbalize understanding, all questions fully answered.   Patient instructed to return to ED, call 911, or call MD for any changes in condition.

## 2018-05-04 NOTE — Discharge Summary (Signed)
[facility] Discharge Summary  Patient name: Tamara Henry Medical record number: [id number] Date of birth: [birth year] Age: 57 y.o. Gender: female Date of Admission: 04/30/2018  Date of Discharge: 05/03/2018 Admitting Physician: Kinnie Feil, MD  Primary Care Provider: Kathrene Alu, MD Consultants: None   Indication for Hospitalization: Acute liver and kidney injury   Discharge Diagnoses/Problem List:  CKD  Acute liver injury, improving  Diabetes mellitus with neuropathy Hypertension Hyperlipidemia NSTEMI s/p CABG in 2017  Asthma OSA GERD Depression  Disposition: Home   Discharge Condition: Stable   Discharge Exam:  General: Alert, NAD HEENT: NCAT, MMM, oropharynx nonerythematous  Cardiac: RRR no m/g/r Lungs: Clear bilaterally, no increased WOB  Abdomen: soft, slightly tender to LUQ and epigastrium, non-distended, normoactive BS Msk: Moves all extremities spontaneously  Ext: Warm, dry, 2+ distal pulses, no edema   Brief Hospital Course:  Tamara Henry is a 57 year old female, with a past history significant for NSTEMI s/p CABG in 2017, GERD, and CKD, that presented with non-radiating intermittent "bowling bowl" sensation in her epigastrium, associated with several bouts of diarrhea for a few days, subsequently found to have acute liver and kidney injury. On admission, AST/ALT 300/263 and Cr 2.21. Afebrile, no leukocytosis. RUQ and complete abdomen ultrasound wnl. Acute hepatitis panel, Stool GI panel, C Diff, HIV, and monospot negative. Negative fecal occult. Troponin negative x3 w/o EKG ischemic changes. Atorvastatin and Celexa were held during her stay given transaminitis. Later in her stay, pt endorsed she had been taking protonix (even though omeprazole listed as home med) before every meal to help with GERD. Her AST/ALT trended down to 77/148 and Cr down to 1.2 by discharge after receiving gentle fluid hydration. At discharge, her abdominal  pain was much improved with bowel movements returned to baseline. Discussed starting PPI taper with patient.   She also had a cough productive with clear sputum, mainly at night. CXR without abnormalities. Thought to be associated with GERD and likely increased while admitted due to unknowningly having her on a reduced PPI dosage compared to her baseline.   Issues for Follow Up:  1. Obtain CMP: follow up on liver enzymes and creatinine.  2. Pt previously taking PPI TID before meals, discharged her to take BID. Will need to continue to taper down as tolerated.  3. Celexa can be associated with increasing LFT's with concurrent use of PPIs. Would reevaluate continued use of Celexa after patient is on appropriate amount of PPI if LFT's still elevated without other obvious etiology. We restarted her Celexa on d/c.  4. Could consider GI referral for further evaluation if labs elevate vs EGD if clinical concern for gastric ulcer.    Significant Procedures: None   Results/Tests Pending at Time of Discharge: Noted return of EBV studies after discharge. Positive for IgG antibodies, indicating most likely past infection that would not cause an elevation of LFTs acutely.  Significant Labs and Imaging:  Recent Labs  Lab 05/01/18 0022 05/02/18 0656 05/03/18 0533  WBC 9.2 5.6 6.1  HGB 12.2 11.7* 10.9*  HCT 39.0 37.2 35.4*  PLT 176 169 170   Recent Labs  Lab 04/30/18 1407 05/01/18 0022 05/02/18 0656 05/03/18 0533  NA 135 138 141 142  K 4.2 3.7 3.9 4.1  CL 104 107 110 112*  CO2 22 24 27 25   GLUCOSE 190* 214* 188* 152*  BUN 29* 25* 19 15  CREATININE 2.21* 1.82* 1.43* 1.24*  CALCIUM 8.4* 8.2* 7.8* 8.1*  ALKPHOS  188* 191* 219* 216*  AST 300* 221* 113* 77*  ALT 263* 230* 187* 148*  ALBUMIN 3.1* 2.8* 2.5* 2.4*      Discharge Medications:  Allergies as of 05/03/2018      Reactions   Lactose Intolerance (gi) Diarrhea   Lisinopril Other (See Comments), Cough   Inflammation, coughing   Reglan  [metoclopramide] Other (See Comments)   REACTION IS SIDE EFFECT Pt stated having lock jaw as a side effect   Wellbutrin [bupropion] Nausea And Vomiting, Cough   Other Other (See Comments)   Biguanide - Unknown   Latex Rash   Metformin And Related Diarrhea   Penicillins Rash, Other (See Comments)   [FROM PREVIOUS ENTRY-BEFORE 07/27/16] >>"ALL CILLINS" PATIENT HAD A PCN REACTION WITH IMMEDIATE RASH, FACIAL/TONGUE/THROAT SWELLING, SOB, OR LIGHTHEADEDNESS WITH HYPOTENSION:  #  #  #  YES  #  #  #   Has patient had a PCN reaction causing severe rash involving mucus membranes or skin necrosis: No Has patient had a PCN reaction that required hospitalization No Has patient had a PCN reaction occurring within the last 10 years:   #  #  #  YES  #  #  #    Sulfa Antibiotics Itching   Tape Rash, Other (See Comments)   Adhesive tape      Medication List    STOP taking these medications   atorvastatin 80 MG tablet Commonly known as:  LIPITOR   ibuprofen 600 MG tablet Commonly known as:  ADVIL,MOTRIN   loperamide 2 MG tablet Commonly known as:  IMODIUM A-D     TAKE these medications   acetaminophen 650 MG CR tablet Commonly known as:  TYLENOL Take 1 tablet (650 mg total) by mouth every 8 (eight) hours as needed for pain.   albuterol 108 (90 Base) MCG/ACT inhaler Commonly known as:  PROVENTIL HFA;VENTOLIN HFA Inhale 1-2 puffs into the lungs every 6 (six) hours as needed for wheezing or shortness of breath.   ALIGN 4 MG Caps Take 1 capsule (4 mg total) by mouth daily.   amLODipine 10 MG tablet Commonly known as:  NORVASC Take 1 tablet (10 mg total) by mouth daily.   aspirin 81 MG EC tablet Take 1 tablet (81 mg total) by mouth daily.   B-D UF III MINI PEN NEEDLES 31G X 5 MM Misc Generic drug:  Insulin Pen Needle See admin instructions. Use with insulin injections   Biotin 5 MG Tbdp Take 1 tablet (5 mg total) by mouth daily.   calcipotriene 0.005 % cream Commonly known as:   DOVONOX Apply 1 application topically as needed (itching).   carvedilol 12.5 MG tablet Commonly known as:  COREG Take 1 tablet (12.5 mg total) by mouth 2 (two) times daily with a meal.   citalopram 40 MG tablet Commonly known as:  CELEXA Take 1 pill (40 mg) daily What changed:    how much to take  how to take this  when to take this  additional instructions   diphenoxylate-atropine 2.5-0.025 MG tablet Commonly known as:  LOMOTIL Take 1 tablet by mouth 4 (four) times daily as needed for diarrhea or loose stools. Take 1-2 tablets up to 4 times daily.   ferrous sulfate 325 (65 FE) MG tablet Take 1 tablet (325 mg total) by mouth daily with breakfast.   Flax Seed Oil 1000 MG Caps Take 1,000 mg by mouth daily.   folic acid 098 MCG tablet Commonly known as:  FOLVITE Take 1  tablet (400 mcg total) by mouth daily.   furosemide 40 MG tablet Commonly known as:  LASIX Take 1 tablet (40 mg total) by mouth daily.   gabapentin 300 MG capsule Commonly known as:  NEURONTIN Take 3 capsules EVERY MORNING, Take 2 capsules at lunch, and take 3 capsules at bedtime What changed:    how much to take  how to take this  when to take this   glucose blood test strip Check blood sugar 6 x daily   guaiFENesin 600 MG 12 hr tablet Commonly known as:  MUCINEX Take 2 tablets (1,200 mg total) 2 (two) times daily by mouth. What changed:    when to take this  reasons to take this   Insulin Glargine 100 UNIT/ML Solostar Pen Commonly known as:  LANTUS Inject 50 Units into the skin daily at 10 pm.   insulin lispro 100 UNIT/ML KiwkPen Commonly known as:  HUMALOG Inject 0.05 mLs (5 Units total) into the skin 2 (two) times daily with a meal.   KRILL OIL PO Take 1 capsule by mouth daily.   LUBRICANT DROPS OP Place 1 drop into both eyes daily as needed (dry eyes).   Melatonin 5 MG Caps Take 1 capsule (5 mg total) by mouth at bedtime.   mometasone 50 MCG/ACT nasal spray Commonly known  as:  NASONEX Place 2 sprays into the nose daily as needed.   multivitamin tablet Take 1 tablet by mouth daily.   olmesartan 20 MG tablet Commonly known as:  BENICAR Take 1 tablet (20 mg total) by mouth daily.   omeprazole 40 MG capsule Commonly known as:  PRILOSEC Take 1 capsule (40 mg total) by mouth 2 (two) times daily. What changed:  when to take this   PREPARATION H 50 % Pads Apply 1 application topically 2 (two) times daily as needed (itching).   tiZANidine 4 MG tablet Commonly known as:  ZANAFLEX TAKE 1 TABLET BY MOUTH EVERY 6 HOURS AS NEEDED FOR MUSCLE SPASM What changed:    how much to take  how to take this  when to take this  reasons to take this  additional instructions   vitamin C 500 MG tablet Commonly known as:  ASCORBIC ACID Take 1 tablet (500 mg total) by mouth daily.       Discharge Instructions: Please refer to Patient Instructions section of EMR for full details.  Patient was counseled important signs and symptoms that should prompt return to medical care, changes in medications, dietary instructions, activity restrictions, and follow up appointments.   Follow-Up Appointments:   Patriciaann Clan, DO 05/04/2018, 11:29 PM PGY-1, [place]

## 2018-05-06 ENCOUNTER — Ambulatory Visit: Payer: Self-pay | Admitting: Pharmacist

## 2018-05-06 ENCOUNTER — Other Ambulatory Visit: Payer: Self-pay | Admitting: *Deleted

## 2018-05-06 ENCOUNTER — Other Ambulatory Visit: Payer: Self-pay | Admitting: Pharmacist

## 2018-05-06 NOTE — Patient Outreach (Addendum)
[facility]) Care Management  05/06/2018  Tamara Henry July 07, 1961 [id number]   Patient was called to follow up on medication adherence (pill packs at [facility] and to review her medications post discharge. Unfortunately, she did not answer the phone. HIPAA compliant message was left on her voicemail.  Plan: Call patient back in 1-2 business days. Send patient an unsuccessful Economist.  Elayne Guerin, PharmD, [place] Clinical Pharmacist [phone]

## 2018-05-06 NOTE — Patient Outreach (Signed)
[facility]) Care Management  05/06/2018  Tamara Henry Feb 11, 1961 [id number]   RN Health Coach Introduction Call  Referral Date:  04/17/2018 Referral Source:  EMMI Prevent Screening Reason for Referral:  Disease Management Education, Medication compliance packaging Insurance:  [employer]   Outreach Attempt:  Outreach attempt #1 to patient for introduction call. No answer. RN Health Coach left HIPAA compliant voicemail message along with contact information.  Plan:  RN Health Coach will send unsuccessful outreach letter to patient.  RN Health Coach will make another outreach attempt to patient within 3-4 business days if no return call back from patient.   [place] [phone] [email or web address]

## 2018-05-08 ENCOUNTER — Ambulatory Visit: Payer: Medicare Other | Admitting: Physical Therapy

## 2018-05-08 ENCOUNTER — Telehealth: Payer: Self-pay | Admitting: Physical Therapy

## 2018-05-08 ENCOUNTER — Other Ambulatory Visit: Payer: Self-pay | Admitting: Pharmacist

## 2018-05-08 ENCOUNTER — Ambulatory Visit: Payer: Self-pay | Admitting: Pharmacist

## 2018-05-08 NOTE — Telephone Encounter (Signed)
Attempted to call patient today to discuss cancellation of remaining appointments. Went straight to VM.   LVM on 8/27 requesting patient to call us back. Unsure if patient received VM as we never received a call back. Needed to communicate that we would need medical clearance for today's visit due to recent hospitalization. Pt upset that this was not handled prior to her arriving and cancelled remaining appointments.

## 2018-05-08 NOTE — Patient Outreach (Signed)
[facility]) Care Management  05/08/2018  Brittanni Cariker [birth year] [id number]   Patient was called for post discharge medication review and to follow up on pill packing. Unfortunately, she did not answer the phone. HIPAA compliant message was left on the patient's voice mail.  [place] on the patient's behalf.  They have all the patient's chronic medications ready to put in pill packs.  They were asked to hold off until I am able to speak with the patient to make sure we are still on board.  The pharmacist was made aware of the following medications that were discontinued at discharge:  atorvastatin 80 MG tablet (LIPITOR) ibuprofen 600 MG tablet (ADVIL,MOTRIN) loperamide 2 MG tablet (IMODIUM A-D)  Plan:  Call patient back in 5-7 business days.   Elayne Guerin, PharmD, [place] Clinical Pharmacist [phone]   Call patient back

## 2018-05-09 ENCOUNTER — Encounter: Payer: Self-pay | Admitting: Physical Therapy

## 2018-05-09 ENCOUNTER — Other Ambulatory Visit: Payer: Self-pay | Admitting: *Deleted

## 2018-05-09 ENCOUNTER — Telehealth: Payer: Self-pay | Admitting: Family Medicine

## 2018-05-09 DIAGNOSIS — G8929 Other chronic pain: Secondary | ICD-10-CM

## 2018-05-09 DIAGNOSIS — M545 Low back pain: Principal | ICD-10-CM

## 2018-05-09 NOTE — Patient Outreach (Signed)
[facility]) Care Management  05/09/2018  Orpah Hausner Aug 20, 1961 [id number]   RN Health Coach Introduction Call  Referral Date:04/17/2018 Referral Source:EMMI Prevent Screening Reason for Referral:Disease Management Education, Medication compliance packaging Insurance:[employer] Medicare   Outreach Attempt:  Received telephone call back from patient.  HIPAA verified with patient.  RN Health Coach introduced self and role.  Patient verbally agrees to monthly telephone outreaches.  Discussed with patient that [place] is trying to reach patient and encouraged her to return call to pharmacist to assist with medication pill packaging.  Plan:  RN Health Coach will outreach within the next 10 business days to complete initial telephone assessment.  [place] [phone] [email or web address] [email or web address]

## 2018-05-09 NOTE — Telephone Encounter (Signed)
Left VM with Hinton Dyer giving her clearance for outpatient rehab.

## 2018-05-09 NOTE — Telephone Encounter (Signed)
Tamara Henry is with Outpatient Rehab. Pt was in the hospital for several days. Pt needs clearance from Dr Shan Levans so she can continue at outpt rehab.

## 2018-05-09 NOTE — Patient Outreach (Signed)
[facility]) Care Management  05/09/2018  Tamara Henry 1960-09-29 [id number]   RN Health Coach Introduction Call  Referral Date:04/17/2018 Referral Source:EMMI Prevent Screening Reason for Referral:Disease Management Education, Medication compliance packaging Insurance:[employer] Medicare   Outreach Attempt:  Outreach attempt #2 to patient for introduction call. No answer. RN Health Coach left HIPAA compliant voicemail message along with contact information.  Plan:  RN Health Coach will make another outreach attempt to patient within 3-4 business days if no return call back from patient.  Tamara Henry [phone] [email or web address]

## 2018-05-09 NOTE — Telephone Encounter (Signed)
Ms. Rosemeyer returned my call today and expressed her frustration in [place] not notifying her that she would need a note from MD to return to PT. She said her time was just as valuable as ours. I apologized and shared I had left her a VM the day prior to her appointment and confirmed her time was valuable. She stated that she had cancelled all of her appointments due to her frustration but she still needs rehab. She was concerned that she would have to arrange Medicaid transportation with at least a 3 day lead time to go to MD to get clearance and then arrange transportation for PT. I offered to call MD with her permission and seek clearance. She was agreeable and thanked me. I told her I would follow-up with her my tomorrow afternoon. Will call MD when office reopens at 1:30.

## 2018-05-10 ENCOUNTER — Encounter: Payer: Self-pay | Admitting: Family Medicine

## 2018-05-10 ENCOUNTER — Ambulatory Visit: Payer: Medicare Other | Admitting: Physical Therapy

## 2018-05-10 ENCOUNTER — Ambulatory Visit (INDEPENDENT_AMBULATORY_CARE_PROVIDER_SITE_OTHER): Payer: Medicare Other | Admitting: Family Medicine

## 2018-05-10 ENCOUNTER — Other Ambulatory Visit: Payer: Self-pay

## 2018-05-10 VITALS — BP 132/82 | HR 83 | Temp 98.3°F | Ht 64.0 in | Wt 224.4 lb

## 2018-05-10 DIAGNOSIS — M779 Enthesopathy, unspecified: Secondary | ICD-10-CM | POA: Diagnosis not present

## 2018-05-10 DIAGNOSIS — N183 Chronic kidney disease, stage 3 unspecified: Secondary | ICD-10-CM

## 2018-05-10 DIAGNOSIS — M7752 Other enthesopathy of left foot: Secondary | ICD-10-CM

## 2018-05-10 NOTE — Patient Instructions (Signed)
It was nice seeing you today Ms. Feeny!  For your ankle, rest it as much as possible and alternate ice and heat to reduce inflammation.  It should get better with time.  Also, I have sent a referral to PT for your ankle.  We will check your liver and kidney function today.  I will call you next week if the results are abnormal.  If you have any questions or concerns, please feel free to call the clinic.   Be well,  Dr. Shan Levans

## 2018-05-10 NOTE — Progress Notes (Signed)
Subjective:    Tamara Henry - 57 y.o. female MRN [id number]  Date of birth: 01-30-1961  HPI  Tamara Henry is here for hospital follow-up.  Patient says the abdominal pain that was the reason for her admission has subsided.  Has stopped Protonix after discharge, and is tolerating this discontinuation well.  Reports no problems from GERD.  L Ankle Pain - sprained ankle 2 months ago after a fall down the stairs - had an ankle brace, but is at her daughter's house  - would like referral to PT for this - cannot walk without pain for the last two weeks - sitting makes it feel better  - has been walking more lately and thinks this may be the cause - no known trauma - soaking in warm water helps, ice also helps   Health Maintenance:  Health Maintenance Due  Topic Date Due  . TETANUS/TDAP  07/07/1982  . COLONOSCOPY  07/07/2013  . INFLUENZA VACCINE  04/11/2018    -  reports that she quit smoking about 12 years ago. Her smoking use included cigarettes. She has a 20.00 pack-year smoking history. She has never used smokeless tobacco. - Review of Systems: Per HPI. - Past Medical History: Patient Active Problem List   Diagnosis Date Noted  . Tendonitis of ankle, left 05/14/2018  . Abnormal LFTs (liver function tests)   . Abdominal pain, RUQ   . Acute renal failure superimposed on chronic kidney disease ([place])   . Epigastric pain   . Transaminitis 04/30/2018  . Left hand pain 04/18/2018  . Sprain of ankle 03/25/2018  . Pain in lower jaw 01/16/2018  . Healthcare maintenance 12/11/2017  . Vaginal bleeding 10/09/2017  . Postmenopausal vaginal bleeding 10/04/2017  . Cough 09/28/2017  . Chronic pansinusitis 09/28/2017  . Laryngopharyngeal reflux (LPR) 08/28/2017  . Sensorineural hearing loss (SNHL), bilateral 08/28/2017  . Diarrhea in adult patient 08/04/2017  . Arm pain, anterior, right 06/17/2017  . OSA on CPAP 05/03/2017  . Morbid obesity due to excess calories ([place]) 05/03/2017    . Microscopic hematuria 03/27/2017  . Impingement syndrome of left shoulder 02/19/2017  . Hypersomnia 12/12/2016  . REM behavioral disorder 12/12/2016  . Asthma 12/12/2016  . Neck pain 11/17/2016  . CKD (chronic kidney disease), stage III ([place]) 11/02/2016  . CAD (coronary artery disease), native coronary artery 10/26/2016  . Poor social situation 10/13/2016  . Leg pain 08/30/2016  . Back pain 08/30/2016  . 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 GERD (gastroesophageal reflux disease) 06/09/2016  . Obesity (BMI 30-39.9) 06/09/2016  . Facial swelling 05/21/2016  . Hyperlipidemia 05/17/2016  . Depression 05/17/2016  . Diabetic peripheral neuropathy ([place]) 05/17/2016  . Neuritis of upper extremity, C7 01/15/2015  . Hypertension 01/14/2015  . Numbness and tingling of right arm 01/14/2015  . Proliferative diabetic retinopathy ([place]) 02/24/2014  . Diabetes mellitus ([place]) 12/18/2013  . Nuclear cataract of both eyes 12/18/2013   - Medications: reviewed and updated   Objective:   Physical Exam BP 132/82   Pulse 83   Temp 98.3 F (36.8 C) (Oral)   Ht 5\' 4"  (1.626 m)   Wt 224 lb 6.4 oz (101.8 kg)   LMP  (LMP Unknown)   SpO2 98%   BMI 38.52 kg/m  Gen: NAD, alert, cooperative with exam, well-appearing CV: RRR, good S1/S2, no murmur, no edema Resp: CTABL, no wheezes, non-labored Abd: SNTND, BS present, no guarding or organomegaly Musculoskeletal: Left ankle: Tenderness to palpation of proximal fourth and fifth  metatarsals.  No bruising.  Full range of motion, normal gait.        Assessment & Plan:   CKD (chronic kidney disease), stage III ([place]) Will obtain a CMP today to ensure that patient's liver and kidney function tests have decreased since hospitalization.  Tendonitis of ankle, left Likely sequela of previous sprain.  Lack of recent trauma makes fracture much less likely.  Tenderness to palpation and worsening with activity support diagnosis of tendinitis.  Referred patient to  physical therapy and encouraged her to rest this foot is much as possible and apply ice daily.  Will avoid Tylenol and NSAIDs given patient's recent elevations in LFTs and her CKD.    Maia Breslow, M.D. 05/14/2018, 10:37 AM PGY-2, [place]

## 2018-05-11 LAB — COMPREHENSIVE METABOLIC PANEL
ALT: 37 IU/L — AB (ref 0–32)
AST: 19 IU/L (ref 0–40)
Albumin/Globulin Ratio: 1.2 (ref 1.2–2.2)
Albumin: 3.7 g/dL (ref 3.5–5.5)
Alkaline Phosphatase: 254 IU/L — ABNORMAL HIGH (ref 39–117)
BUN/Creatinine Ratio: 16 (ref 9–23)
BUN: 21 mg/dL (ref 6–24)
Bilirubin Total: 0.3 mg/dL (ref 0.0–1.2)
CO2: 23 mmol/L (ref 20–29)
CREATININE: 1.32 mg/dL — AB (ref 0.57–1.00)
Calcium: 9.4 mg/dL (ref 8.7–10.2)
Chloride: 100 mmol/L (ref 96–106)
GFR, EST AFRICAN AMERICAN: 53 mL/min/{1.73_m2} — AB (ref >59)
GFR, EST NON AFRICAN AMERICAN: 46 mL/min/{1.73_m2} — AB (ref >59)
GLUCOSE: 208 mg/dL — AB (ref 65–99)
Globulin, Total: 3.2 g/dL (ref 1.5–4.5)
Potassium: 4.2 mmol/L (ref 3.5–5.2)
Sodium: 138 mmol/L (ref 134–144)
TOTAL PROTEIN: 6.9 g/dL (ref 6.0–8.5)

## 2018-05-14 ENCOUNTER — Other Ambulatory Visit: Payer: Self-pay

## 2018-05-14 ENCOUNTER — Telehealth: Payer: Self-pay | Admitting: Family Medicine

## 2018-05-14 ENCOUNTER — Other Ambulatory Visit: Payer: Self-pay | Admitting: Pharmacist

## 2018-05-14 DIAGNOSIS — M7752 Other enthesopathy of left foot: Secondary | ICD-10-CM | POA: Insufficient documentation

## 2018-05-14 MED ORDER — DIPHENOXYLATE-ATROPINE 2.5-0.025 MG PO TABS: 1.0000 | ORAL_TABLET | Freq: Four times a day (QID) | ORAL | 0 refills | Status: AC | PRN

## 2018-05-14 MED ORDER — OLMESARTAN MEDOXOMIL 20 MG PO TABS: 20.0000 mg | ORAL_TABLET | Freq: Every day | ORAL | Status: DC

## 2018-05-14 NOTE — Assessment & Plan Note (Addendum)
Will obtain a CMP today to ensure that patient's liver and kidney function tests have decreased since hospitalization.

## 2018-05-14 NOTE — Patient Outreach (Addendum)
[facility]) Care Management  [place]   05/14/2018  Ramsey Midgett 12/08/1960 [id number]  Subjective:  Patient was called regarding medication adherence. HIPAA identifiers were obtained.  Patient called me back after I called her last week regarding medication adherence and pill packing options.  HIPAA identifiers were obtained. Patient is a 57 year old female with multiple medical conditions including but not limited to: CKD, CAD, type 2 diabetes, depression, CAD, GERD, hyperlipidemia and morbid obesity. Patient was discharged from the hospital 05/03/18 for epigastric pain with elevated LFTs.  Objective:   Encounter Medications: Outpatient Encounter Medications as of 05/14/2018  Medication Sig  . acetaminophen (TYLENOL) 650 MG CR tablet Take 1 tablet (650 mg total) by mouth every 8 (eight) hours as needed for pain.  Marland Kitchen albuterol (PROVENTIL HFA;VENTOLIN HFA) 108 (90 Base) MCG/ACT inhaler Inhale 1-2 puffs into the lungs every 6 (six) hours as needed for wheezing or shortness of breath.  Marland Kitchen amLODipine (NORVASC) 10 MG tablet Take 1 tablet (10 mg total) by mouth daily.  Marland Kitchen aspirin 81 MG EC tablet Take 1 tablet (81 mg total) by mouth daily.  . B-D UF III MINI PEN NEEDLES 31G X 5 MM MISC See admin instructions. Use with insulin injections  . Biotin 5 MG TBDP Take 1 tablet (5 mg total) by mouth daily.  . calcipotriene (DOVONOX) 0.005 % cream Apply 1 application topically as needed (itching).  . carvedilol (COREG) 12.5 MG tablet Take 1 tablet (12.5 mg total) by mouth 2 (two) times daily with a meal.  . citalopram (CELEXA) 40 MG tablet Take 1 pill (40 mg) daily (Patient taking differently: Take 40 mg by mouth daily. )  . diphenoxylate-atropine (LOMOTIL) 2.5-0.025 MG tablet Take 1 tablet by mouth 4 (four) times daily as needed for diarrhea or loose stools. Take 1-2 tablets up to 4 times daily.  . ferrous sulfate 325 (65 FE) MG tablet Take 1 tablet (325 mg total) by mouth daily  with breakfast.  . Flaxseed, Linseed, (FLAX SEED OIL) 1000 MG CAPS Take 1,000 mg by mouth daily.  . folic acid (FOLVITE) 628 MCG tablet Take 1 tablet (400 mcg total) by mouth daily.  Marland Kitchen gabapentin (NEURONTIN) 300 MG capsule Take 3 capsules EVERY MORNING, Take 2 capsules at lunch, and take 3 capsules at bedtime (Patient taking differently: Take 600-900 mg by mouth See admin instructions. Take 3 capsules EVERY MORNING, Take 2 capsules at lunch, and take 3 capsules at bedtime)  . glucose blood (ONETOUCH VERIO) test strip Check blood sugar 6 x daily  . guaiFENesin (MUCINEX) 600 MG 12 hr tablet Take 2 tablets (1,200 mg total) 2 (two) times daily by mouth. (Patient taking differently: Take 1,200 mg by mouth 2 (two) times daily as needed for cough or to loosen phlegm. )  . Insulin Glargine (LANTUS SOLOSTAR) 100 UNIT/ML Solostar Pen Inject 50 Units into the skin daily at 10 pm.  . insulin lispro (HUMALOG KWIKPEN) 100 UNIT/ML KiwkPen Inject 0.05 mLs (5 Units total) into the skin 2 (two) times daily with a meal.  . KRILL OIL PO Take 1 capsule by mouth daily.  . Melatonin 5 MG CAPS Take 1 capsule (5 mg total) by mouth at bedtime.  . mometasone (NASONEX) 50 MCG/ACT nasal spray Place 2 sprays into the nose daily as needed.  . Multiple Vitamins-Minerals (MULTIVITAMIN) tablet Take 1 tablet by mouth daily.  Marland Kitchen olmesartan (BENICAR) 20 MG tablet Take 1 tablet (20 mg total) by mouth daily.  Marland Kitchen omeprazole (  PRILOSEC) 40 MG capsule Take 1 capsule (40 mg total) by mouth 2 (two) times daily.  . Polyvinyl Alcohol (LUBRICANT DROPS OP) Place 1 drop into both eyes daily as needed (dry eyes).  . Probiotic Product (ALIGN) 4 MG CAPS Take 1 capsule (4 mg total) by mouth daily.  Marland Kitchen tiZANidine (ZANAFLEX) 4 MG tablet TAKE 1 TABLET BY MOUTH EVERY 6 HOURS AS NEEDED FOR MUSCLE SPASM (Patient taking differently: Take 4 mg by mouth every 6 (six) hours as needed for muscle spasms. )  . vitamin C (ASCORBIC ACID) 500 MG tablet Take 1 tablet (500  mg total) by mouth daily.  [name] (PREPARATION H) 50 % PADS Apply 1 application topically 2 (two) times daily as needed (itching).  . furosemide (LASIX) 40 MG tablet Take 1 tablet (40 mg total) by mouth daily.   No facility-administered encounter medications on file as of 05/14/2018.     Functional Status: In your present state of health, do you have any difficulty performing the following activities: 05/01/2018 04/30/2018  Hearing? - N  Vision? - N  Difficulty concentrating or making decisions? - N  Walking or climbing stairs? - N  Dressing or bathing? - N  Doing errands, shopping? N N  Some recent data might be hidden    Fall/Depression Screening: Fall Risk  05/10/2018 03/25/2018 01/30/2018  Falls in the past year? Yes Yes No  Number falls in past yr: 2 or more 2 or more -  Comment - - -  Injury with Fall? Yes Yes -  Comment sprained ankle/bumps bruises - -  Risk Factor Category  - High Fall Risk -  Risk for fall due to : - - -  Follow up - Falls evaluation completed;Education provided;Falls prevention discussed -   PHQ 2/9 Scores 05/10/2018 04/18/2018 04/17/2018 03/27/2018 03/25/2018 01/30/2018 01/16/2018  PHQ - 2 Score 0 0 0 0 0 0 2  PHQ- 9 Score - - - 0 - - 8      Assessment: ASSESSMENT: Date Discharged from Hospital: 05/03/18 Date Medication Reconciliation Performed: 05/14/2018  Medications Discontinued at Discharge:   Atorvastatin  Ibuprofen  Loperamide  No new medications were prescribed at discharge.  Patient was recently discharged from hospital and all medications have been reviewed   Drugs sorted by system:  Neurologic/Psychologic: Citalopram, Gabapentin,   Cardiovascular: Amlodipine, Aspirin, Carvedilol, Furosemide, Krill Oil, Olmesartan,   Pulmonary/Allergy: Albuterol, Guaifenesin, Mometasone,   Gastrointestinal: Diphenoxylate-atropine,  Omeprazole, Probiotic, Witch Hazel Pads (Preparation H)  Endocrine: Lantus,  Humalog  Topical: Calcipotriene   Pain: Acetaminophen, Ibuprofen, Tizanidine,   Vitamins/Minerals: Ascorbic Acid, B Complex, Biotin, Ferrous Sulfate, Flaxseed, Folic Acid, Melatonin, Multiple Vitamin,   Miscellaneous: Polyvinyl Alcohol eye drops   Pill Packing Update:  [facility] was called to follow up on the patient's pill packs.  The following medications will be put in pill packs and will be delivered to the patient tomorrow:  Amlodipine Carvedilol Citalopram Omeprazole Furosemide Olmesartan (has to be transferred from [facility]) Gabapentin  Medications to be filled but will not be in the pill pack: Lantus Humalog Mometasone Diphenoxylate-Atropine (called [facility] to request a refill)  Patient gets all of her OTC products from [facility] at no cost.  PLAN: Follow up with patient and pharmacy tomorrow.   Elayne Guerin, PharmD, [place] Clinical Pharmacist [phone]

## 2018-05-14 NOTE — Assessment & Plan Note (Signed)
Likely sequela of previous sprain.  Lack of recent trauma makes fracture much less likely.  Tenderness to palpation and worsening with activity support diagnosis of tendinitis.  Referred patient to physical therapy and encouraged her to rest this foot is much as possible and apply ice daily.  Will avoid Tylenol and NSAIDs given patient's recent elevations in LFTs and her CKD.

## 2018-05-14 NOTE — Telephone Encounter (Signed)
Left VM telling patient that her kidney function is at baseline, her LFTs have decreased, and her alkaline phosphatase remains elevated, which is less concerning given her negative work up in the hospital and her improvement in symptoms.  We may need to change her Celexa to a different SSRI in the future if her alk phos remains high.

## 2018-05-15 ENCOUNTER — Other Ambulatory Visit: Payer: Self-pay | Admitting: Pharmacist

## 2018-05-15 ENCOUNTER — Other Ambulatory Visit: Payer: Self-pay

## 2018-05-15 MED ORDER — OLMESARTAN MEDOXOMIL 20 MG PO TABS: 20.0000 mg | ORAL_TABLET | Freq: Every day | ORAL | 3 refills | Status: AC

## 2018-05-15 NOTE — Patient Outreach (Signed)
[facility]) Care Management  05/15/2018  Tamara Henry 06-09-1961 [id number]   Pharmacist at [facility] called and left a message stating that he was still waiting an olmesartan prescription.  Review of the patient's chart reveals Olmesartan was prescribed but it does not look like it fully processed to the pharmacy as the order class is "no print" versus "normal".  A message was left at [facility] triage line requesting the prescription be resent.  The prescription for Olmesartan was sent to [facility]. The pharmacist said the patient should get her packs tomorrow.   Plan: Call patient tomorrow to follow up then schedule a home visit to check the packs.   Elayne Guerin, PharmD, [place] Clinical Pharmacist [phone]

## 2018-05-15 NOTE — Telephone Encounter (Signed)
Olmesartan re-sent to pharmacy. Was set to no print.  Danley Danker, RN [facility] RN)

## 2018-05-16 ENCOUNTER — Ambulatory Visit: Payer: Medicare Other | Attending: Family Medicine | Admitting: Physical Therapy

## 2018-05-16 ENCOUNTER — Other Ambulatory Visit: Payer: Self-pay | Admitting: Pharmacist

## 2018-05-16 ENCOUNTER — Other Ambulatory Visit: Payer: Self-pay | Admitting: *Deleted

## 2018-05-16 ENCOUNTER — Encounter: Payer: Self-pay | Admitting: Physical Therapy

## 2018-05-16 DIAGNOSIS — M545 Low back pain: Secondary | ICD-10-CM | POA: Diagnosis not present

## 2018-05-16 DIAGNOSIS — G8929 Other chronic pain: Secondary | ICD-10-CM

## 2018-05-16 DIAGNOSIS — R293 Abnormal posture: Secondary | ICD-10-CM | POA: Diagnosis not present

## 2018-05-16 DIAGNOSIS — M25572 Pain in left ankle and joints of left foot: Secondary | ICD-10-CM | POA: Diagnosis not present

## 2018-05-16 DIAGNOSIS — M62830 Muscle spasm of back: Secondary | ICD-10-CM | POA: Diagnosis not present

## 2018-05-16 NOTE — Therapy (Signed)
[place], [place], [address] Phone: [phone]   Fax:  [phone]  Physical Therapy RE-Evaluation  Patient Details  Name: Tamara Henry MRN: [id number] Date of Birth: 03/18/1961 Referring Provider: now has PT script for ankle from Maia Breslow MD   Encounter Date: 05/16/2018  PT End of Session - 05/16/18 2249    Visit Number  3    Number of Visits  12    Date for PT Re-Evaluation  06/13/18    Authorization Type  MCR MCD    PT Start Time  1330    PT Stop Time  1415    PT Time Calculation (min)  45 min    Activity Tolerance  Patient tolerated treatment well;Patient limited by pain    Behavior During Therapy  [facility] for tasks assessed/performed       Past Medical History:  Diagnosis Date  . Acid reflux   . Allergy    seasonal  . Anxiety   . Arthritis    lower back, feet  . Asthma    albuterol inhaler - rarely uses - only needs when she gets sick  . Back pain   . Bilateral pulmonary embolism ([place]) 05/2016  . Bipolar disorder ([place])   . CAD in native artery    S/P NSTEMI with cath showing severe 3 vessel ASCAD s/p CABGx4 07/28/16.  . Cataracts, bilateral   . CKD (chronic kidney disease), stage III (HCC)    borderline CKD II-III  . Cocaine abuse ([place])    In remission. Stopped using in early 2000's  . Depression   . Diabetes mellitus ([place])    Type II  . GERD (gastroesophageal reflux disease)   . Glaucoma   . High cholesterol   . History of blood transfusion 2017  . History of pulmonary embolus (PE)   . 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 Hx of chest tube placement right   . Hypertension   . Morbid obesity ([place])   . Myocardial infarction ([place]) 2017  . Neuromuscular disorder (HCC)    neuropathy in hands and feet  . Neuropathy    hands and feet  . Pneumonia   . Postoperative anemia 07/2016  . PTSD (post-traumatic stress disorder)   . Sleep apnea    Does not use cpap  . Stroke [facility]) 2005   no  residual  . SVD (spontaneous vaginal delivery)    x 4    Past Surgical History:  Procedure Laterality Date  . CARDIAC CATHETERIZATION N/A 07/25/2016   Procedure: Left Heart Cath and Coronary Angiography;  Surgeon: Peter M Martinique, MD;  Location: [facility];  Service: Cardiovascular;  Laterality: N/A;  . COLONOSCOPY    . COLONOSCOPY W/ POLYPECTOMY    . CORONARY ARTERY BYPASS GRAFT N/A 07/28/2016   Procedure: CORONARY ARTERY BYPASS GRAFTING (CABG)x4 using left internal mammary and endoscopic harvest of right greater saphenous vein;  Surgeon: Ivin Poot, MD;  Location: [facility];  Service: Open Heart Surgery;  Laterality: N/A;  . DENTAL SURGERY     to removed broken teeth  . HYSTEROSCOPY W/D&C N/A 01/31/2018   Procedure: DILATATION AND CURETTAGE /HYSTEROSCOPY;  Surgeon: Woodroe Mode, MD;  Location: [facility];  Service: Gynecology;  Laterality: N/A;  . POLYPECTOMY    . PULMONARY EMBOLISM SURGERY  2017   [facility]  . SHOULDER ARTHROSCOPY WITH ROTATOR CUFF REPAIR Left 02/19/2017   Procedure: LEFT SHOULDER ARTHROSCOPY with debridement andROTATOR CUFF REPAIR;  Surgeon: Lorin Mercy,  Thana Farr, MD;  Location: [facility];  Service: Orthopedics;  Laterality: Left;  . TEE WITHOUT CARDIOVERSION N/A 07/28/2016   Procedure: TRANSESOPHAGEAL ECHOCARDIOGRAM (TEE);  Surgeon: Ivin Poot, MD;  Location: [facility];  Service: Open Heart Surgery;  Laterality: N/A;  . TONSILLECTOMY    . TUBAL LIGATION      There were no vitals filed for this visit.  Subjective Assessment - 05/16/18 1404    Subjective  Pt returns to PT for first time since 04/24/18. She relays she was disoriented and had to go to hospital for acute renal failure. She now has new PT presription for Lt ankle.    How long can you stand comfortably?  5 min    How long can you walk comfortably?  cant walk around grocery store now and has to use cart    Diagnostic tests  Lt ankle x-ray shows no acute fracture or dislocation but does show calcified PVD.     Patient Stated Goals  walk better and feel better    Currently in Pain?  Yes         [facility] PT Assessment - 05/16/18 0001      Assessment   Medical Diagnosis  chroinic LBP and Lt ankle tendonitis    Referring Provider  now has PT script for ankle from Maia Breslow MD      Precautions   Precautions  None      Restrictions   Weight Bearing Restrictions  No      ROM / Strength   AROM / PROM / Strength  AROM;Strength      AROM   AROM Assessment Site  Lumbar;Ankle    Right/Left Ankle  Left    Left Ankle Dorsiflexion  5    Left Ankle Plantar Flexion  --   WFL   Left Ankle Inversion  --   [facility]   Left Ankle Eversion  10    Lumbar Flexion  72    Lumbar Extension  25    Lumbar - Right Side Bend  20 pulling pain on Rt    Lumbar - Left Side Bend  25    Lumbar - Right Rotation  75%, slight pain      Strength   Overall Strength Comments  LE normal but LT ankle EV 4/5 and INV 4+/5      Flexibility   Hamstrings  60 deg Lt, 70 deg Rt                   [facility] Adult PT Treatment/Exercise - 05/16/18 0001      Exercises   Exercises  Lumbar;Ankle      Lumbar Exercises: Stretches   Single Knee to Chest Stretch  2 reps;Right;Left;30 seconds    Lower Trunk Rotation  3 reps;10 seconds    Piriformis Stretch  2 reps;Left;Right;20 seconds      Modalities   Modalities  Moist Heat      Moist Heat Therapy   Number Minutes Moist Heat  15 Minutes    Moist Heat Location  Lumbar Spine;Ankle      Ankle Exercises: Stretches   Gastroc Stretch  2 reps;30 seconds      Ankle Exercises: Supine   T-Band  Red 4 way X 15 ea             PT Education - 05/16/18 2248    Education Details  new HEP for ankle    Person(s) Educated  Patient    Methods  Explanation;Demonstration;Handout    Comprehension  Verbalized understanding;Need further instruction       PT Short Term Goals - 05/16/18 2254      PT SHORT TERM GOAL #1   Title  She will be independent with initial HEP     Baseline  new HEP for ankle    Status  On-going      PT SHORT TERM GOAL #2   Title  She will report 30% decr in intensity of episodes of LBP    Time  3    Period  Weeks    Status  Achieved        PT Long Term Goals - 05/16/18 2254      PT LONG TERM GOAL #1   Title  She will be independent with all HEP issued    Time  6    Period  Weeks    Status  Partially Met      PT LONG TERM GOAL #2   Title  She will report episodes of LBP and LT ankle to dec to once over 2 weeks     Time  4    Period  Weeks    Status  Revised      PT LONG TERM GOAL #3   Title  When having episode of pain max pain 3/10    Time  6    Period  Weeks    Status  On-going      PT LONG TERM GOAL #4   Title  She will be able to ambulate at least 300 feet for community distance.     Time  4    Period  Weeks    Status  New            Plan - 05/16/18 2250    Clinical Impression Statement  RE performed today to add Lt ankle into POC for tendonitis. She had negative x-ray for ankle but has does have lateral ankle pain, decreased ROM, decreased strength, decreased neuromuscular control and increased pain with WB activites. She has made some small improvements in lumbar ROM but pain levels continue to be high and she has not had good attendance with PT. She will benefit from continued PT for back and PT will now focus on Lt ankle as this is her biggest complaint now.    Rehab Potential  Fair    PT Frequency  2x / week    PT Duration  4 weeks    PT Treatment/Interventions  Manual techniques;Taping;Dry needling;Patient/family education;Therapeutic exercise;Therapeutic activities;Moist Heat    PT Next Visit Plan  stretching and strengthenig for lumbar and ankle, modalties and MT PRN    Consulted and Agree with Plan of Care  Patient       Patient will benefit from skilled therapeutic intervention in order to improve the following deficits and impairments:     Visit Diagnosis: Pain in left ankle and joints  of left foot  Chronic right-sided low back pain without sciatica  Muscle spasm of back     Problem List Patient Active Problem List   Diagnosis Date Noted  . Tendonitis of ankle, left 05/14/2018  . Abnormal LFTs (liver function tests)   . Abdominal pain, RUQ   . Acute renal failure superimposed on chronic kidney disease ([place])   . Epigastric pain   . Transaminitis 04/30/2018  . Left hand pain 04/18/2018  . Sprain of ankle 03/25/2018  . Pain in lower jaw 01/16/2018  . Healthcare maintenance 12/11/2017  .  Vaginal bleeding 10/09/2017  . Postmenopausal vaginal bleeding 10/04/2017  . Cough 09/28/2017  . Chronic pansinusitis 09/28/2017  . Laryngopharyngeal reflux (LPR) 08/28/2017  . Sensorineural hearing loss (SNHL), bilateral 08/28/2017  . Diarrhea in adult patient 08/04/2017  . Arm pain, anterior, right 06/17/2017  . OSA on CPAP 05/03/2017  . Morbid obesity due to excess calories ([place]) 05/03/2017  . Microscopic hematuria 03/27/2017  . Impingement syndrome of left shoulder 02/19/2017  . Hypersomnia 12/12/2016  . REM behavioral disorder 12/12/2016  . Asthma 12/12/2016  . Neck pain 11/17/2016  . CKD (chronic kidney disease), stage III ([place]) 11/02/2016  . CAD (coronary artery disease), native coronary artery 10/26/2016  . Poor social situation 10/13/2016  . Leg pain 08/30/2016  . Back pain 08/30/2016  . 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 GERD (gastroesophageal reflux disease) 06/09/2016  . Obesity (BMI 30-39.9) 06/09/2016  . Facial swelling 05/21/2016  . Hyperlipidemia 05/17/2016  . Depression 05/17/2016  . Diabetic peripheral neuropathy ([place]) 05/17/2016  . Neuritis of upper extremity, C7 01/15/2015  . Hypertension 01/14/2015  . Numbness and tingling of right arm 01/14/2015  . Proliferative diabetic retinopathy ([place]) 02/24/2014  . Diabetes mellitus ([place]) 12/18/2013  . Nuclear cataract of both eyes 12/18/2013    Debbe Odea, PT, DPT 05/16/2018, 10:57 PM  [facility] [address] [place], [place], [address] Phone: [phone]   Fax:  [phone]  Name: Tamara Henry MRN: [id number] Date of Birth: 08/07/1961

## 2018-05-16 NOTE — Patient Outreach (Signed)
[facility]) Care Management  05/16/2018  Tamara Henry [birth year] [id number]   RN Health Coach Initial Assessment  Referral Date:04/17/2018 Referral Source:EMMI Prevent Screening Reason for Referral:Disease Management Education, Medication compliance packaging Insurance:[employer] Medicare   Outreach Attempt:  Outreach attempt #1 to patient for initial telephone assessment.  Patient answered and verified HIPAA.  Stated she was unable to complete initial assessment due to being at the doctor's office; requesting a call back another day.   Plan:  RN Health Coach will make another outreach attempt to patient within 10 business days per patient request.   [name] RN [place] [phone] [email or web address]

## 2018-05-16 NOTE — Patient Outreach (Signed)
[facility]) Care Management  05/16/2018  Tamara Henry [birth year] [id number]   Patient called and wondered if her insulin was going to be delivered today. HIPAA identifiers were obtained.  Patient was given the number to [facility] for her records.    [employer] was called on the patient's behalf and the patient's pill packs and insulin (Lantus and Humalog) will be delivered to her today.  Called patient back and left her a message.  Plan: Await a call back. Call patient to schedule a home visit to inspect her pill packs.  Elayne Guerin, PharmD, [place] Clinical Pharmacist [phone]

## 2018-05-20 DIAGNOSIS — R229 Localized swelling, mass and lump, unspecified: Secondary | ICD-10-CM

## 2018-05-20 DIAGNOSIS — M65342 Trigger finger, left ring finger: Secondary | ICD-10-CM | POA: Insufficient documentation

## 2018-05-20 DIAGNOSIS — R609 Edema, unspecified: Secondary | ICD-10-CM | POA: Diagnosis not present

## 2018-05-20 DIAGNOSIS — R2232 Localized swelling, mass and lump, left upper limb: Secondary | ICD-10-CM | POA: Diagnosis not present

## 2018-05-20 DIAGNOSIS — IMO0002 Reserved for concepts with insufficient information to code with codable children: Secondary | ICD-10-CM | POA: Insufficient documentation

## 2018-05-20 DIAGNOSIS — M65341 Trigger finger, right ring finger: Secondary | ICD-10-CM | POA: Diagnosis not present

## 2018-05-22 ENCOUNTER — Ambulatory Visit: Payer: Medicare Other | Admitting: Physical Therapy

## 2018-05-24 ENCOUNTER — Encounter: Payer: Self-pay | Admitting: *Deleted

## 2018-05-24 ENCOUNTER — Telehealth: Payer: Self-pay

## 2018-05-24 ENCOUNTER — Other Ambulatory Visit: Payer: Self-pay | Admitting: Family Medicine

## 2018-05-24 ENCOUNTER — Other Ambulatory Visit: Payer: Self-pay | Admitting: *Deleted

## 2018-05-24 ENCOUNTER — Telehealth: Payer: Self-pay | Admitting: Pharmacist

## 2018-05-24 MED ORDER — ONETOUCH ULTRASOFT LANCETS MISC: 12 refills | Status: AC

## 2018-05-24 NOTE — Progress Notes (Signed)
Prescription sent for onetouch lancets to [facility]

## 2018-05-24 NOTE — Patient Outreach (Signed)
[facility]) Care Management  [place]  05/24/2018   Tamara Henry 08-Oct-1960 [id number]   RN Health Coach Initial Assessment   Referral Date:  04/17/2018 Referral Source:  EMMI Prevent Screening Reason for Referral:  Disease Management Education, Medication compliance packaging Insurance:  [employer]   Outreach Attempt:  Successful telephone outreach to patient for initial telephone assessment.  HIPAA verified with patient.  Patient completed initial telephone assessment.  Social: Patient lives at home alone in boarding house.  Verbalizing the she is very intimidated by her land lord and doesn't like living in her current environment.  [facility] Social Worker has mailed patient list of housing resources and patient is stating she is in th process of reviewing those options.  Ambulates independently and reports 4 falls in the last year with the last one 2 months ago resulting in a sprained left ankle.  Patient stating she is undergoing outpatient physical therapy once a week for her ankle sprain.  Independent with ADLs and IADLs.  Uses Medicaid transportation to get to her medical appointments.   DME in the home include:  CBG meter, blood pressure cuff, scale, eyeglasses, upper dentures, and CPAP (she no longer uses).  Conditions:   Per chart review and discussion with patient, PMH include but not limited to:  Diabetes, chronic kidney disease, chronic pansinuitis, laryngopharyngeal reflux, sensorineural hearing loss, obstructive sleep apnea, asthma, coronary artery disease, chronic back pain, coronary artery bypass grafting, GERD, hyperlipidemia, depression, diabetic peripheral neuropathy, hypertension, diabetic retinopathy, and bilateral cataracts.  Recent hospitalization for acute on chronic renal failure related to dehydration caused by diarrhea.  Reports monitoring her blood sugars about 2 times a day.  Stating she did not receive lancets and test strips  from the pharmacy so she has not been able to check her blood sugar the last few days.  Encouraged patient to contact the pharmacy and request lancets and test strips.  RN Health Coach sent in box message to [place] to assist with patient retrieving test strips and lancets. Last Hgb A1C was 7.9 on 04/30/2018.  Medications:  Patient reports taking about 20 medications.  [facility] has arranged patient to receive prepackaged pill packs.  Patient reporting she has received pill packs and is taking medications.  Denies any trouble affording medications at this time.  Encounter Medications:  Outpatient Encounter Medications as of 05/24/2018  Medication Sig  . acetaminophen (TYLENOL) 650 MG CR tablet Take 1 tablet (650 mg total) by mouth every 8 (eight) hours as needed for pain.  Marland Kitchen albuterol (PROVENTIL HFA;VENTOLIN HFA) 108 (90 Base) MCG/ACT inhaler Inhale 1-2 puffs into the lungs every 6 (six) hours as needed for wheezing or shortness of breath.  Marland Kitchen amLODipine (NORVASC) 10 MG tablet Take 1 tablet (10 mg total) by mouth daily.  Marland Kitchen aspirin 81 MG EC tablet Take 1 tablet (81 mg total) by mouth daily.  . B-D UF III MINI PEN NEEDLES 31G X 5 MM MISC See admin instructions. Use with insulin injections  . Biotin 5 MG TBDP Take 1 tablet (5 mg total) by mouth daily.  . calcipotriene (DOVONOX) 0.005 % cream Apply 1 application topically as needed (itching).  . carvedilol (COREG) 12.5 MG tablet Take 1 tablet (12.5 mg total) by mouth 2 (two) times daily with a meal.  . citalopram (CELEXA) 40 MG tablet Take 1 pill (40 mg) daily (Patient taking differently: Take 40 mg by mouth daily. )  . diphenoxylate-atropine (LOMOTIL) 2.5-0.025 MG tablet  Take 1 tablet by mouth 4 (four) times daily as needed for diarrhea or loose stools. Take 1-2 tablets up to 4 times daily.  . ferrous sulfate 325 (65 FE) MG tablet Take 1 tablet (325 mg total) by mouth daily with breakfast.  . Flaxseed, Linseed, (FLAX SEED OIL) 1000 MG  CAPS Take 1,000 mg by mouth daily.  . folic acid (FOLVITE) 086 MCG tablet Take 1 tablet (400 mcg total) by mouth daily.  . furosemide (LASIX) 40 MG tablet Take 1 tablet (40 mg total) by mouth daily.  Marland Kitchen gabapentin (NEURONTIN) 300 MG capsule Take 3 capsules EVERY MORNING, Take 2 capsules at lunch, and take 3 capsules at bedtime (Patient taking differently: Take 600-900 mg by mouth See admin instructions. Take 3 capsules EVERY MORNING, Take 2 capsules at lunch, and take 3 capsules at bedtime)  . glucose blood (ONETOUCH VERIO) test strip Check blood sugar 6 x daily  . guaiFENesin (MUCINEX) 600 MG 12 hr tablet Take 2 tablets (1,200 mg total) 2 (two) times daily by mouth. (Patient taking differently: Take 1,200 mg by mouth 2 (two) times daily as needed for cough or to loosen phlegm. )  . Insulin Glargine (LANTUS SOLOSTAR) 100 UNIT/ML Solostar Pen Inject 50 Units into the skin daily at 10 pm.  . insulin lispro (HUMALOG KWIKPEN) 100 UNIT/ML KiwkPen Inject 0.05 mLs (5 Units total) into the skin 2 (two) times daily with a meal.  . KRILL OIL PO Take 1 capsule by mouth daily.  . Melatonin 5 MG CAPS Take 1 capsule (5 mg total) by mouth at bedtime.  . mometasone (NASONEX) 50 MCG/ACT nasal spray Place 2 sprays into the nose daily as needed.  . Multiple Vitamins-Minerals (MULTIVITAMIN) tablet Take 1 tablet by mouth daily.  Marland Kitchen olmesartan (BENICAR) 20 MG tablet Take 1 tablet (20 mg total) by mouth daily.  Marland Kitchen omeprazole (PRILOSEC) 40 MG capsule Take 1 capsule (40 mg total) by mouth 2 (two) times daily.  . Polyvinyl Alcohol (LUBRICANT DROPS OP) Place 1 drop into both eyes daily as needed (dry eyes).  . Probiotic Product (ALIGN) 4 MG CAPS Take 1 capsule (4 mg total) by mouth daily.  Marland Kitchen tiZANidine (ZANAFLEX) 4 MG tablet TAKE 1 TABLET BY MOUTH EVERY 6 HOURS AS NEEDED FOR MUSCLE SPASM (Patient taking differently: Take 4 mg by mouth every 6 (six) hours as needed for muscle spasms. )  . vitamin C (ASCORBIC ACID) 500 MG tablet  Take 1 tablet (500 mg total) by mouth daily.  [name] (PREPARATION H) 50 % PADS Apply 1 application topically 2 (two) times daily as needed (itching).   No facility-administered encounter medications on file as of 05/24/2018.     Functional Status:  In your present state of health, do you have any difficulty performing the following activities: 05/24/2018 05/01/2018  Hearing? N -  Vision? N -  Difficulty concentrating or making decisions? Y -  Comment forgetful -  Walking or climbing stairs? Y -  Comment due to spranged ankle, in Outpatient therapy currently -  Dressing or bathing? N -  Doing errands, shopping? N N  Preparing Food and eating ? N -  Using the Toilet? N -  In the past six months, have you accidently leaked urine? Y -  Comment wears pull ups -  Do you have problems with loss of bowel control? N -  Managing your Medications? N -  Managing your Finances? Y -  Housekeeping or managing your Housekeeping? N -  Some  recent data might be hidden    Fall/Depression Screening: Fall Risk  05/24/2018 05/10/2018 03/25/2018  Falls in the past year? Yes Yes Yes  Number falls in past yr: 2 or more 2 or more 2 or more  Comment 4 falls in the last year - -  Injury with Fall? Yes Yes Yes  Comment sprained her left ankle sprained ankle/bumps bruises -  Risk Factor Category  High Fall Risk - High Fall Risk  Risk for fall due to : History of fall(s);Impaired balance/gait;Impaired mobility;Impaired vision;Medication side effect - -  Follow up Education provided;Falls prevention discussed - Falls evaluation completed;Education provided;Falls prevention discussed   PHQ 2/9 Scores 05/24/2018 05/10/2018 04/18/2018 04/17/2018 03/27/2018 03/25/2018 01/30/2018  PHQ - 2 Score 0 0 0 0 0 0 0  PHQ- 9 Score - - - - 0 - -    THN CM Care Plan Problem One     Most Recent Value  Care Plan Problem One  Knowledge deficiet related to self care management of diabetes.  Role Documenting the Problem One   [place] for Problem One  Active  [facility] Long Term Goal   Patient will report no hospitalizations in the next 90 days.  THN Long Term Goal Start Date  05/24/18  Interventions for Problem One Long Term Goal  Current care plan and goals reviewed and discussed with patient, encouraged to keep and attend medical appointments, reviewed medications and indications, encouraged medication compliance, reviewed current Hgb A1C results and ways to reduce, encouraged healthier meal options and drink options, sending Living Well with Diabetes Educational Packet, reviewed signs and symptoms of hypo and hyperglycemia  THN CM Short Term Goal #1   Patient will obtain lancets and test strips in the next 30 days.  THN CM Short Term Goal #1 Start Date  05/24/18  Interventions for Short Term Goal #1  Encouraged patient to contact pharmacy and verify if test strips and lancets should have come with her pill packs if not then to request for strips and lancetes to be filled and pick up from pharmacy, message sent [facility] Pharmacist to verify if patient should have recieved test strips and lancets, encouraged patient to contact [facility] staff if she is having difficulties obtaining strips and lancets from pharmacy  [facility] CM Short Term Goal #2   Patient will report monitoring blood sugars at least twice a day in the next 30 days.  THN CM Short Term Goal #2 Start Date  05/24/18  Interventions for Short Term Goal #2  Discussed with patient importance of monitoring blood sugars especially while on insulin, encouraged patient to obtain lancets and test strips for CBG meter to monitor blood sugars, encouraged patient to notify physician for sustained blood sugar elevations     Appointments:  Patient attended last medical appointment on 05/10/2018 and needs to schedule next follow up appointment.  Patient encouraged to schedule follow up appointment for 3 month follow up per physician instructions.  Advanced Directives:  Denies  having Advance Directive in place and does not wish to create one.   Consent:  [facility] services reviewed and discussed.  Patient verbally agrees to Disease Management outreaches.  Plan: RN Health Coach will send primary MD barriers letter. RN Health Coach will route initial telephone assessment note to primary MD. [place] will send patient [place]. RN Health Coach will send patient Living Well with Diabetes Booklet. RN Health Coach sent message to [facility] Pharmacist to assist with obtaining  test strips and lancets. RN Health Coach will make next telephone outreach to patient in the month of October.  [place] [phone] [email or web address]

## 2018-05-24 NOTE — Patient Outreach (Addendum)
[facility]) Care Management  05/24/2018  Tamara Henry May 27, 1961 [id number]   Received a message from [name], [name] saying the patient did not receive her test strips and lancets when she received her pill packs.  [place] was called. The pharmacist confirmed the patient's test strips were filled on 05/11/2018 and were delivered on 05/16/2018.  They did not have a lancet prescription on file. The pharmacist was asked to reach to [facility] for [facility] transfer.  Patient's PCP office was called and asked to send in a new prescription for one touch verio test strips.   Patient was called and informed the pharmacy said they delivered the strips on 05/16/18.  Patient said she has been "back and forth" and may have over looked the strips as she has not been staying at the shipping address recently.  She was asked to look for the strips and let me know.   Patient reported her pill packs are working well and she understands how to use them.  Plan: Follow up with patient in 2 weeks just before time for her new packs.   Elayne Guerin, PharmD, [place] Clinical Pharmacist [phone]

## 2018-05-24 NOTE — Telephone Encounter (Signed)
Tamara Henry with [facility] called for a Rx for One Touch lancets to be sent to [facility].  Tamara Henry's call back is [phone]  Danley Danker, RN [facility] Clinic RN)

## 2018-05-24 NOTE — Telephone Encounter (Signed)
-----   Message from Leona Singleton, RN sent at 05/24/2018 12:54 PM EDT ----- Regarding: Question about Pill Packs Hi Alwyn Ren,  I am speaking with Ms. Alig currently and she says she has received her pill packs but did not get lancets or test strips.  Would they come monthly with the pill packs or does she need to contact the pharmacy for them.  She has not spoken with the pharmacy to ask and I am requesting she do so.  But she wants me to check with you first.  Thanks,   [name] RN [place] [phone] [email or web address]

## 2018-05-29 ENCOUNTER — Ambulatory Visit (INDEPENDENT_AMBULATORY_CARE_PROVIDER_SITE_OTHER): Payer: Medicare Other | Admitting: Orthotics

## 2018-05-29 ENCOUNTER — Ambulatory Visit: Payer: Medicare Other | Admitting: Physical Therapy

## 2018-05-29 ENCOUNTER — Encounter: Payer: Self-pay | Admitting: Physical Therapy

## 2018-05-29 ENCOUNTER — Telehealth: Payer: Self-pay | Admitting: Physical Therapy

## 2018-05-29 DIAGNOSIS — R293 Abnormal posture: Secondary | ICD-10-CM | POA: Diagnosis not present

## 2018-05-29 DIAGNOSIS — M205X2 Other deformities of toe(s) (acquired), left foot: Secondary | ICD-10-CM

## 2018-05-29 DIAGNOSIS — M25572 Pain in left ankle and joints of left foot: Secondary | ICD-10-CM

## 2018-05-29 DIAGNOSIS — E1142 Type 2 diabetes mellitus with diabetic polyneuropathy: Secondary | ICD-10-CM | POA: Diagnosis not present

## 2018-05-29 DIAGNOSIS — E1149 Type 2 diabetes mellitus with other diabetic neurological complication: Secondary | ICD-10-CM | POA: Diagnosis not present

## 2018-05-29 DIAGNOSIS — M62830 Muscle spasm of back: Secondary | ICD-10-CM

## 2018-05-29 DIAGNOSIS — G8929 Other chronic pain: Secondary | ICD-10-CM

## 2018-05-29 DIAGNOSIS — M2021 Hallux rigidus, right foot: Secondary | ICD-10-CM

## 2018-05-29 DIAGNOSIS — M545 Low back pain: Secondary | ICD-10-CM | POA: Diagnosis not present

## 2018-05-29 DIAGNOSIS — M205X1 Other deformities of toe(s) (acquired), right foot: Secondary | ICD-10-CM

## 2018-05-29 NOTE — Therapy (Signed)
[place], [place], [address] Phone: [phone]   Fax:  [phone]  Physical Therapy Treatment  Patient Details  Name: Tamara Henry MRN: [id number] Date of Birth: 01/07/1961 Referring Provider: now has PT script for ankle from Maia Breslow MD   Encounter Date: 05/29/2018  PT End of Session - 05/29/18 1503    Visit Number  4    Number of Visits  12    Date for PT Re-Evaluation  06/13/18    PT Start Time  1400    PT Stop Time  1500    PT Time Calculation (min)  60 min    Activity Tolerance  Patient tolerated treatment well    Behavior During Therapy  [facility] for tasks assessed/performed       Past Medical History:  Diagnosis Date  . Acid reflux   . Allergy    seasonal  . Anxiety   . Arthritis    lower back, feet  . Asthma    albuterol inhaler - rarely uses - only needs when she gets sick  . Back pain   . Bilateral pulmonary embolism ([place]) 05/2016  . Bipolar disorder ([place])   . CAD in native artery    S/P NSTEMI with cath showing severe 3 vessel ASCAD s/p CABGx4 07/28/16.  . Cataracts, bilateral   . CKD (chronic kidney disease), stage III (HCC)    borderline CKD II-III  . Cocaine abuse ([place])    In remission. Stopped using in early 2000's  . Depression   . Diabetes mellitus ([place])    Type II  . GERD (gastroesophageal reflux disease)   . Glaucoma   . High cholesterol   . History of blood transfusion 2017  . History of pulmonary embolus (PE)   . 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 Hx of chest tube placement right   . Hypertension   . Morbid obesity ([place])   . Myocardial infarction ([place]) 2017  . Neuromuscular disorder (HCC)    neuropathy in hands and feet  . Neuropathy    hands and feet  . Pneumonia   . Postoperative anemia 07/2016  . PTSD (post-traumatic stress disorder)   . Sleep apnea    Does not use cpap  . Stroke [facility]) 2005   no residual  . SVD (spontaneous vaginal delivery)    x 4     Past Surgical History:  Procedure Laterality Date  . CARDIAC CATHETERIZATION N/A 07/25/2016   Procedure: Left Heart Cath and Coronary Angiography;  Surgeon: Peter M Martinique, MD;  Location: [place] CV LAB;  Service: Cardiovascular;  Laterality: N/A;  . COLONOSCOPY    . COLONOSCOPY W/ POLYPECTOMY    . CORONARY ARTERY BYPASS GRAFT N/A 07/28/2016   Procedure: CORONARY ARTERY BYPASS GRAFTING (CABG)x4 using left internal mammary and endoscopic harvest of right greater saphenous vein;  Surgeon: Ivin Poot, MD;  Location: [place];  Service: Open Heart Surgery;  Laterality: N/A;  . DENTAL SURGERY     to removed broken teeth  . HYSTEROSCOPY W/D&C N/A 01/31/2018   Procedure: DILATATION AND CURETTAGE /HYSTEROSCOPY;  Surgeon: Woodroe Mode, MD;  Location: [place] [facility];  Service: Gynecology;  Laterality: N/A;  . POLYPECTOMY    . PULMONARY EMBOLISM SURGERY  2017   [facility]  . SHOULDER ARTHROSCOPY WITH ROTATOR CUFF REPAIR Left 02/19/2017   Procedure: LEFT SHOULDER ARTHROSCOPY with debridement andROTATOR CUFF REPAIR;  Surgeon: [name], MD;  Location: [place];  Service: Orthopedics;  Laterality: Left;  . TEE WITHOUT CARDIOVERSION N/A 07/28/2016   Procedure: TRANSESOPHAGEAL ECHOCARDIOGRAM (TEE);  Surgeon: Ivin Poot, MD;  Location: [place];  Service: Open Heart Surgery;  Laterality: N/A;  . TONSILLECTOMY    . TUBAL LIGATION      There were no vitals filed for this visit.  Subjective Assessment - 05/29/18 1401    Currently in Pain?  Yes    Pain Location  Back    Pain Orientation  Lower    Pain Descriptors / Indicators  Sharp;Aching    Aggravating Factors   walking,   sitting too long  in car,   getting out of the chair    Pain Relieving Factors  heat,  ice    Effect of Pain on Daily Activities  walking limited    Multiple Pain Sites  Yes    Pain Location  Ankle    Pain Orientation  Left;Posterior;Lateral    Pain Descriptors / Indicators  Tender;Sore;Cramping    Pain Frequency  Intermittent     Aggravating Factors   walking    Pain Relieving Factors  ace ,  good shoes,  heating pad                       [facility] Adult PT Treatment/Exercise - 05/29/18 0001      Self-Care   Self-Care  --   Anatomy ankle/  back     Lumbar Exercises: Stretches   Passive Hamstring Stretch  2 reps;30 seconds    Passive Hamstring Stretch Limitations  LT ROM limited 50 %    Lower Trunk Rotation  5 reps    Pelvic Tilt Limitations  5 X and single tilt with 5 breaths.     Other Lumbar Stretch Exercise  quadratus stretc to Lt 10 sec x 3   Sitting and on sideover pillow     Lumbar Exercises: Supine   Bent Knee Raise  10 reps    Bent Knee Raise Limitations  cued      Moist Heat Therapy   Number Minutes Moist Heat  10 Minutes    Moist Heat Location  Ankle      Ankle Exercises: Stretches   Gastroc Stretch  --   added to HEP   Other Stretch  pro stretch 2 minutes   both     Ankle Exercises: Seated   Other Seated Ankle Exercises  manual resistance IV 10 X    Other Seated Ankle Exercises  Manual EV resistance 10 X    Added IV isometrics to HEP ( WeakestIV vs  EV)            PT Education - 05/29/18 1502    Education Details  HEP anatomy    Person(s) Educated  Patient    Methods  Demonstration;Explanation;Tactile cues;Verbal cues;Handout    Comprehension  Returned demonstration;Verbalized understanding       PT Short Term Goals - 05/16/18 2254      PT SHORT TERM GOAL #1   Title  She will be independent with initial HEP    Baseline  new HEP for ankle    Status  On-going      PT SHORT TERM GOAL #2   Title  She will report 30% decr in intensity of episodes of LBP    Time  3    Period  Weeks    Status  Achieved        PT Long Term Goals - 05/16/18 2254  PT LONG TERM GOAL #1   Title  She will be independent with all HEP issued    Time  6    Period  Weeks    Status  Partially Met      PT LONG TERM GOAL #2   Title  She will report episodes of LBP and LT  ankle to dec to once over 2 weeks     Time  4    Period  Weeks    Status  Revised      PT LONG TERM GOAL #3   Title  When having episode of pain max pain 3/10    Time  6    Period  Weeks    Status  On-going      PT LONG TERM GOAL #4   Title  She will be able to ambulate at least 300 feet for community distance.     Time  4    Period  Weeks    Status  New            Plan - 05/29/18 1452    Clinical Impression Statement  Patient able to progress HEP today.  Mild increases in pain with some exercises addressed with cues for exercise modification to decrease pain.  Antalgic gait today.  Working toward [employer] goal.    PT Next Visit Plan  stretching and strengthenig for lumbar and ankle, modalties and MT PRNreview HEP    PT Home Exercise Plan  PPT, quadratus stretch, tennis ball STW, ankle IV isometric, calf stretch,  hip flexor stretch.    Consulted and Agree with Plan of Care  Patient       Patient will benefit from skilled therapeutic intervention in order to improve the following deficits and impairments:     Visit Diagnosis: Pain in left ankle and joints of left foot  Chronic right-sided low back pain without sciatica  Muscle spasm of back  Abnormal posture     Problem List Patient Active Problem List   Diagnosis Date Noted  . Tendonitis of ankle, left 05/14/2018  . Abnormal LFTs (liver function tests)   . Abdominal pain, RUQ   . Acute renal failure superimposed on chronic kidney disease ([place])   . Epigastric pain   . Transaminitis 04/30/2018  . Left hand pain 04/18/2018  . Sprain of ankle 03/25/2018  . Pain in lower jaw 01/16/2018  . Healthcare maintenance 12/11/2017  . Vaginal bleeding 10/09/2017  . Postmenopausal vaginal bleeding 10/04/2017  . Cough 09/28/2017  . Chronic pansinusitis 09/28/2017  . Laryngopharyngeal reflux (LPR) 08/28/2017  . Sensorineural hearing loss (SNHL), bilateral 08/28/2017  . Diarrhea in adult patient 08/04/2017  . Arm pain,  anterior, right 06/17/2017  . OSA on CPAP 05/03/2017  . Morbid obesity due to excess calories ([place]) 05/03/2017  . Microscopic hematuria 03/27/2017  . Impingement syndrome of left shoulder 02/19/2017  . Hypersomnia 12/12/2016  . REM behavioral disorder 12/12/2016  . Asthma 12/12/2016  . Neck pain 11/17/2016  . CKD (chronic kidney disease), stage III ([place]) 11/02/2016  . CAD (coronary artery disease), native coronary artery 10/26/2016  . Poor social situation 10/13/2016  . Leg pain 08/30/2016  . Back pain 08/30/2016  . 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 GERD (gastroesophageal reflux disease) 06/09/2016  . Obesity (BMI 30-39.9) 06/09/2016  . Facial swelling 05/21/2016  . Hyperlipidemia 05/17/2016  . Depression 05/17/2016  . Diabetic peripheral neuropathy ([place]) 05/17/2016  . Neuritis of upper extremity, C7 01/15/2015  . Hypertension 01/14/2015  .  Numbness and tingling of right arm 01/14/2015  . Proliferative diabetic retinopathy ([place]) 02/24/2014  . Diabetes mellitus ([place]) 12/18/2013  . Nuclear cataract of both eyes 12/18/2013    HARRIS,KAREN PTA 05/29/2018, 3:05 PM  [place], [place], [address] Phone: [phone]   Fax:  [phone]  Name: Tamara Henry MRN: [id number] Date of Birth: 1961-08-11

## 2018-05-29 NOTE — Patient Instructions (Addendum)
Hip Flexor Stretch    Lying on back near edge of bed, bend one leg, foot flat. Hang other leg over edge, relaxed, thigh resting entirely on bed for 1____ minutes. Repeat _3___ times. Do _1-2___ sessions per day. Advanced Exercise: Bend knee back keeping thigh in contact with bed.  http://gt2.exer.us/347   Copyright  VHI. All rights reserved.  Inversion: Isometric    Press inner borders of feet into ball or rolled pillow between feet. Hold __3-5__ seconds. Relax. Repeat _5-10___ times per set. Do _1___ sets per session. Do _1-2Calf Stretch    Place one leg forward, bent, other leg behind and straight. Lean forward keeping back heel flat. Hold _30___ seconds while counting out loud. Repeat with other leg forward. Repeat __3__ times. Do _1___ sessions per day.  http://gt2.exer.us/478   Copyright  VHI. All rights reserved.  ___ sessions per day.  http://orth.exer.us/7   Copyright  VHI. All rights reserved.

## 2018-05-29 NOTE — Telephone Encounter (Signed)
Pt no show for PT appointment today. They where contacted and someone picked up phone but did not answer back.   Elsie Ra, PT, DPT 05/29/18 10:51 AM

## 2018-05-29 NOTE — Progress Notes (Signed)

## 2018-06-05 ENCOUNTER — Ambulatory Visit: Payer: Medicare Other | Admitting: Physical Therapy

## 2018-06-05 DIAGNOSIS — G8929 Other chronic pain: Secondary | ICD-10-CM

## 2018-06-05 DIAGNOSIS — M25572 Pain in left ankle and joints of left foot: Secondary | ICD-10-CM | POA: Diagnosis not present

## 2018-06-05 DIAGNOSIS — M545 Low back pain: Secondary | ICD-10-CM

## 2018-06-05 DIAGNOSIS — R293 Abnormal posture: Secondary | ICD-10-CM | POA: Diagnosis not present

## 2018-06-05 DIAGNOSIS — M62830 Muscle spasm of back: Secondary | ICD-10-CM | POA: Diagnosis not present
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
## 2018-06-07 ENCOUNTER — Other Ambulatory Visit: Payer: Self-pay | Admitting: Pharmacist

## 2018-06-07 ENCOUNTER — Ambulatory Visit: Payer: Self-pay | Admitting: Pharmacist

## 2018-06-07 NOTE — Patient Outreach (Signed)
[facility]) Care Management  06/07/2018  Tamara Henry [birth year] [id number]   Patient was called to follow up on pill pack reorder through [facility].  Unfortunately, she did not answer her phone. HIPAA compliant message was left on her voicemail.  Plan: Await a call back from the patient Due to Audit next week, will recall the patient in 7-10 business days.   Elayne Guerin, PharmD, [place] Clinical Pharmacist [phone]

## 2018-06-11 DIAGNOSIS — H2513 Age-related nuclear cataract, bilateral: Secondary | ICD-10-CM | POA: Diagnosis not present

## 2018-06-11 DIAGNOSIS — E113513 Type 2 diabetes mellitus with proliferative diabetic retinopathy with macular edema, bilateral: Secondary | ICD-10-CM | POA: Diagnosis not present

## 2018-06-14 ENCOUNTER — Other Ambulatory Visit: Payer: Self-pay | Admitting: Family Medicine

## 2018-06-18 ENCOUNTER — Ambulatory Visit: Payer: Medicare Other | Attending: Family Medicine | Admitting: Physical Therapy

## 2018-06-18 ENCOUNTER — Other Ambulatory Visit: Payer: Self-pay

## 2018-06-18 DIAGNOSIS — R293 Abnormal posture: Secondary | ICD-10-CM | POA: Diagnosis not present

## 2018-06-18 DIAGNOSIS — M62830 Muscle spasm of back: Secondary | ICD-10-CM

## 2018-06-18 DIAGNOSIS — M545 Low back pain: Secondary | ICD-10-CM | POA: Insufficient documentation

## 2018-06-18 DIAGNOSIS — G8929 Other chronic pain: Secondary | ICD-10-CM

## 2018-06-18 DIAGNOSIS — M25572 Pain in left ankle and joints of left foot: Secondary | ICD-10-CM | POA: Diagnosis not present

## 2018-06-18 NOTE — Therapy (Signed)
[place], [place], [address] Phone: [phone]   Fax:  [phone]  Physical Therapy Treatment Progress Note Reporting Period 06/18/18 to 07/16/18  See note below for Objective Data and Assessment of Progress/Goals.       Patient Details  Name: Tamara Henry MRN: [id number] Date of Birth: 11/01/1960 Referring Provider (PT): Winfrey, A., Gambino, C.   Encounter Date: 06/18/2018  PT End of Session - 06/18/18 1517    Visit Number  6    Number of Visits  14    Date for PT Re-Evaluation  07/30/18    Authorization Type  MCR MCD    PT Start Time  1345    PT Stop Time  1447    PT Time Calculation (min)  62 min    Activity Tolerance  Patient tolerated treatment well    Behavior During Therapy  WFL for tasks assessed/performed       Past Medical History:  Diagnosis Date  . Acid reflux   . Allergy    seasonal  . Anxiety   . Arthritis    lower back, feet  . Asthma    albuterol inhaler - rarely uses - only needs when she gets sick  . Back pain   . Bilateral pulmonary embolism ([place]) 05/2016  . Bipolar disorder ([place])   . CAD in native artery    S/P NSTEMI with cath showing severe 3 vessel ASCAD s/p CABGx4 07/28/16.  . Cataracts, bilateral   . CKD (chronic kidney disease), stage III (HCC)    borderline CKD II-III  . Cocaine abuse ([place])    In remission. Stopped using in early 2000's  . Depression   . Diabetes mellitus ([place])    Type II  . GERD (gastroesophageal reflux disease)   . Glaucoma   . High cholesterol   . History of blood transfusion 2017  . History of pulmonary embolus (PE)   . 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 Hx of chest tube placement right   . Hypertension   . Morbid obesity ([place])   . Myocardial infarction ([place]) 2017  . Neuromuscular disorder (HCC)    neuropathy in hands and feet  . Neuropathy    hands and feet  . Pneumonia   . Postoperative anemia 07/2016  . PTSD (post-traumatic  stress disorder)   . Sleep apnea    Does not use cpap  . Stroke [facility]) 2005   no residual  . SVD (spontaneous vaginal delivery)    x 4    Past Surgical History:  Procedure Laterality Date  . CARDIAC CATHETERIZATION N/A 07/25/2016   Procedure: Left Heart Cath and Coronary Angiography;  Surgeon: Peter M Martinique, MD;  Location: [place] [facility];  Service: Cardiovascular;  Laterality: N/A;  . COLONOSCOPY    . COLONOSCOPY W/ POLYPECTOMY    . CORONARY ARTERY BYPASS GRAFT N/A 07/28/2016   Procedure: CORONARY ARTERY BYPASS GRAFTING (CABG)x4 using left internal mammary and endoscopic harvest of right greater saphenous vein;  Surgeon: Ivin Poot, MD;  Location: [place];  Service: Open Heart Surgery;  Laterality: N/A;  . DENTAL SURGERY     to removed broken teeth  . HYSTEROSCOPY W/D&C N/A 01/31/2018   Procedure: DILATATION AND CURETTAGE /HYSTEROSCOPY;  Surgeon: Woodroe Mode, MD;  Location: [place] [facility];  Service: Gynecology;  Laterality: N/A;  . POLYPECTOMY    . PULMONARY EMBOLISM SURGERY  2017   [facility]  . SHOULDER ARTHROSCOPY WITH ROTATOR CUFF REPAIR Left  02/19/2017   Procedure: LEFT SHOULDER ARTHROSCOPY with debridement andROTATOR CUFF REPAIR;  Surgeon: [name], MD;  Location: [place];  Service: Orthopedics;  Laterality: Left;  . TEE WITHOUT CARDIOVERSION N/A 07/28/2016   Procedure: TRANSESOPHAGEAL ECHOCARDIOGRAM (TEE);  Surgeon: Ivin Poot, MD;  Location: [place];  Service: Open Heart Surgery;  Laterality: N/A;  . TONSILLECTOMY    . TUBAL LIGATION      There were no vitals filed for this visit.  Subjective Assessment - 06/18/18 1453    Subjective  Pt. returns for 6th therapy visit for lumbar as well as new left ankle diagnosis. She reports back pain had been improving but noting some recent exacerbation from standing at the bus stop as well as going grocery shopping with pain local to right lumbar region. Pt. continues with left peroneal tendon region ankle pain but notes some  improvement with walking tolerance on ramps/slopes from previous status.    Limitations  Standing;Walking    How long can you stand comfortably?  variable    How long can you walk comfortably?  walks some at grocery store but with limitation due to LBP and ankle pain    Diagnostic tests  Lt ankle x-ray shows no acute fracture or dislocation but does show calcified PVD.    Patient Stated Goals  walk better and feel better    Currently in Pain?  Yes    Pain Score  6     Pain Location  Ankle    Pain Orientation  Left    Pain Descriptors / Indicators  Sharp    Pain Type  Chronic pain    Pain Onset  More than a month ago    Pain Frequency  Intermittent    Aggravating Factors   standing and walking    Pain Relieving Factors  ice, heat    Effect of Pain on Daily Activities  limited walking tolerance for activities such as grocery shopping, limited standing tolerance for IADLs    Multiple Pain Sites  Yes    Pain Location  Back    Pain Orientation  Right    Pain Descriptors / Indicators  Sore    Pain Frequency  Intermittent    Aggravating Factors   standing and walking    Pain Relieving Factors  heat         [facility] PT Assessment - 06/18/18 0001      Assessment   Medical Diagnosis  LBP, left ankle tendonitis    Referring Provider (PT)  Winfrey, A., Gambino, C.      Precautions   Precautions  None      Restrictions   Weight Bearing Restrictions  No      ROM / Strength   AROM / PROM / Strength  Strength;AROM      AROM   AROM Assessment Site  Lumbar;Ankle    Right/Left Ankle  Left    Left Ankle Plantar Flexion  55    Left Ankle Inversion  40    Left Ankle Eversion  30    Lumbar Flexion  75    Lumbar Extension  35    Lumbar - Right Side Bend  20    Lumbar - Left Side Bend  20    Lumbar - Right Rotation  75%    Lumbar - Left Rotation  75%      Strength   Overall Strength Comments  Left ankle EV 4+/5 otherwise ankle 5/5, LE otherwise normal  Flexibility   Hamstrings   60 deg Lt, 70 deg Rt                   [facility] Adult PT Treatment/Exercise - 06/18/18 0001      Lumbar Exercises: Stretches   Passive Hamstring Stretch  Left;Right;2 reps;30 seconds    Single Knee to Chest Stretch  --   DKTC with 65 cm P-ball x 20   Lower Trunk Rotation  --   15 reps   Piriformis Stretch  Right;3 reps;30 seconds    Other Lumbar Stretch Exercise  right manual QL stretch 3x30 sec    Other Lumbar Stretch Exercise  pelvic tilt x 20 reps      Modalities   Modalities  Cryotherapy      Cryotherapy   Number Minutes Cryotherapy  10 Minutes    Cryotherapy Location  Ankle   left   Type of Cryotherapy  Ice pack      Manual Therapy   Manual Therapy  Soft tissue mobilization    Soft tissue mobilization  STM right lumbar region, posterolateral hip, left peroneals, left gastrocemius      Ankle Exercises: Supine   Isometrics  left ankle EV x 15 reps    T-Band  Left ankle Theraband 4-way yellow 2x10 ea.      Ankle Exercises: Stretches   Gastroc Stretch  30 seconds;3 reps             PT Education - 06/18/18 1516    Education Details  POC, brief review ankle Theraband exercises for HEP    Person(s) Educated  Patient    Methods  Explanation;Demonstration;Tactile cues;Verbal cues    Comprehension  Verbal cues required;Tactile cues required;Returned demonstration;Verbalized understanding       PT Short Term Goals - 06/18/18 1527      PT SHORT TERM GOAL #1   Title  Pt. will be independent with initial HEP    Time  3    Period  Weeks    Status  On-going      PT SHORT TERM GOAL #2   Title  She will report 30% decr in intensity of episodes of LBP    Time  3    Period  Weeks    Status  Achieved        PT Long Term Goals - 06/18/18 1528      PT LONG TERM GOAL #2   Title  She will report episodes of LBP and LT ankle to dec to once over 2 weeks     Time  4    Period  Weeks    Status  On-going      PT LONG TERM GOAL #3   Title  Perform  ADLs/IADLs such as chores at home, waiting at bus stop, brief trips to grocery store with back and ankle pain 3/10 or less    Time  4    Period  Weeks    Status  Revised      PT LONG TERM GOAL #4   Title  Pt. to ambulate for short trips to grocery store at least 20 min without needing to use motorized cart.    Baseline  intermittently uses cart due to pain    Time  4    Period  Weeks    Status  New      PT LONG TERM GOAL #5   Title  Left ankle strength grossly 5/5 to improve stability for walking outdoors  over uneven surfaces    Time  4    Period  Weeks    Status  New            Plan - 06/18/18 1518    Clinical Impression Statement  Back previously improving but some recent exacerbation from standing and walking for bus stop, shopping. Symptoms primarily axial with suspect myofascial component with underlying core weakness and degenerative changes. Mild gains with left ankle pain and improved strength/associated functional gains for walking tolerance-symptoms consistent with peroneal tendonitis. Status complicated by multiple treatment areas and limited number sessions to date with setback from hospitalization as well as factors including multiple comorbidities. Pt. would benefit from continued PT for further progress to address current functional limitations.    History and Personal Factors relevant to plan of care:  chronic LBP history, comorbidities    Clinical Presentation  Stable    Clinical Decision Making  Low    Rehab Potential  Fair    Clinical Impairments Affecting Rehab Potential  chronic LBP history, comorbidities    PT Frequency  2x / week    PT Duration  4 weeks    PT Treatment/Interventions  Manual techniques;Taping;Dry needling;Patient/family education;Therapeutic exercise;Therapeutic activities;Moist Heat;Neuromuscular re-education;Cryotherapy;Ultrasound;Iontophoresis 4mg /ml Dexamethasone;Gait training;Functional mobility training    PT Next Visit Plan  Check  response manual techniques to lumbar region and continue as needed, continue ankle exercises as tolerated, more exercise focus with lumbar region    PT Home Exercise Plan  PPT, quadratus stretch, tennis ball STW, ankle isometrics and theraband 4-way, calf stretch,  hip flexor stretch.    Consulted and Agree with Plan of Care  Patient       Patient will benefit from skilled therapeutic intervention in order to improve the following deficits and impairments:  Pain, Increased muscle spasms, Postural dysfunction, Decreased range of motion, Decreased strength, Abnormal gait, Decreased activity tolerance, Difficulty walking, Obesity  Visit Diagnosis: Pain in left ankle and joints of left foot  Chronic right-sided low back pain without sciatica  Muscle spasm of back  Abnormal posture     Problem List Patient Active Problem List   Diagnosis Date Noted  . Tendonitis of ankle, left 05/14/2018  . Abnormal LFTs (liver function tests)   . Abdominal pain, RUQ   . Acute renal failure superimposed on chronic kidney disease ([place])   . Epigastric pain   . Transaminitis 04/30/2018  . Left hand pain 04/18/2018  . Sprain of ankle 03/25/2018  . Pain in lower jaw 01/16/2018  . Healthcare maintenance 12/11/2017  . Vaginal bleeding 10/09/2017  . Postmenopausal vaginal bleeding 10/04/2017  . Cough 09/28/2017  . Chronic pansinusitis 09/28/2017  . Laryngopharyngeal reflux (LPR) 08/28/2017  . Sensorineural hearing loss (SNHL), bilateral 08/28/2017  . Diarrhea in adult patient 08/04/2017  . Arm pain, anterior, right 06/17/2017  . OSA on CPAP 05/03/2017  . Morbid obesity due to excess calories ([place]) 05/03/2017  . Microscopic hematuria 03/27/2017  . Impingement syndrome of left shoulder 02/19/2017  . Hypersomnia 12/12/2016  . REM behavioral disorder 12/12/2016  . Asthma 12/12/2016  . Neck pain 11/17/2016  . CKD (chronic kidney disease), stage III ([place]) 11/02/2016  . CAD (coronary artery disease),  native coronary artery 10/26/2016  . Poor social situation 10/13/2016  . Leg pain 08/30/2016  . Back pain 08/30/2016  . 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 GERD (gastroesophageal reflux disease) 06/09/2016  . Obesity (BMI 30-39.9) 06/09/2016  . Facial swelling 05/21/2016  . Hyperlipidemia 05/17/2016  . Depression  05/17/2016  . Diabetic peripheral neuropathy ([place]) 05/17/2016  . Neuritis of upper extremity, C7 01/15/2015  . Hypertension 01/14/2015  . Numbness and tingling of right arm 01/14/2015  . Proliferative diabetic retinopathy ([place]) 02/24/2014  . Diabetes mellitus ([place]) 12/18/2013  . Nuclear cataract of both eyes 12/18/2013   Beaulah Dinning, PT, DPT 06/18/18 3:35 PM  [facility] [address] [place], [place], [address] Phone: [phone]   Fax:  [phone]  Name: Tamara Henry MRN: [id number] Date of Birth: Mar 06, 1961

## 2018-06-19 ENCOUNTER — Ambulatory Visit: Payer: Medicare Other | Admitting: Physical Therapy

## 2018-06-19 ENCOUNTER — Telehealth: Payer: Self-pay | Admitting: Physical Therapy

## 2018-06-19 NOTE — Telephone Encounter (Signed)
Called patient and left message re: no show for 1:30 PM appointment today.

## 2018-06-20 ENCOUNTER — Other Ambulatory Visit: Payer: Self-pay | Admitting: Pharmacist

## 2018-06-20 ENCOUNTER — Ambulatory Visit: Payer: Self-pay | Admitting: Pharmacist

## 2018-06-20 NOTE — Patient Outreach (Signed)
[facility]) Care Management  06/20/2018  Tamara Henry March 18, 1961 [id number]   Patient was called to follow up on her medication pill packs. HIPAA identifiers were obtained.  Patient confirmed she is receiving all of her medications from [facility] as prescribed.    Plan: Close pharmacy case as patient has received all of her medications and understands how to use her pill packs.  Patient communicated understanding that she can call me back at any time with medication related questions or concerns. I will notify patient's [facility] Social Worker and Nurse as they are still involved with her care.   Elayne Guerin, PharmD, [place] Clinical Pharmacist [phone]

## 2018-06-21 ENCOUNTER — Other Ambulatory Visit: Payer: Self-pay | Admitting: *Deleted

## 2018-06-21 NOTE — Patient Outreach (Signed)
[facility]) Care Management  06/21/2018  Tamara Henry 1961-08-24 [id number]   RN Health Coach Monthly Outreach  Referral Date:04/17/2018 Referral Source:EMMI Prevent Screening Reason for Referral:Disease Management Education, Medication compliance packaging Insurance:[employer] Medicare   Outreach Attempt:  Outreach attempt #1 to patient for monthly follow up. No answer. RN Health Coach left HIPAA compliant voicemail message along with contact information.  Plan:  RN Health Coach will make another outreach attempt within the month of October.   [place] [phone] [email or web address] [email or web address]

## 2018-06-25 ENCOUNTER — Telehealth: Payer: Self-pay | Admitting: Licensed Clinical Social Worker

## 2018-06-25 ENCOUNTER — Ambulatory Visit: Payer: Medicare Other | Admitting: Physical Therapy
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
## 2018-06-27 ENCOUNTER — Encounter: Payer: Self-pay | Admitting: Physical Therapy

## 2018-06-27 ENCOUNTER — Ambulatory Visit: Payer: Medicare Other | Admitting: Physical Therapy

## 2018-06-27 DIAGNOSIS — G8929 Other chronic pain: Secondary | ICD-10-CM

## 2018-06-27 DIAGNOSIS — M62830 Muscle spasm of back: Secondary | ICD-10-CM

## 2018-06-27 DIAGNOSIS — M25572 Pain in left ankle and joints of left foot: Secondary | ICD-10-CM

## 2018-06-27 DIAGNOSIS — M545 Low back pain: Secondary | ICD-10-CM | POA: Diagnosis not present

## 2018-06-27 DIAGNOSIS — R293 Abnormal posture: Secondary | ICD-10-CM | POA: Diagnosis not present

## 2018-06-27 NOTE — Therapy (Signed)
[facility] [place], [place], [address] Phone: [phone]   Fax:  [phone]  Physical Therapy Treatment  Patient Details  Name: Tamara Henry MRN: [id number] Date of Birth: Nov 20, 1960 Referring Provider (PT): Winfrey, A., Gambino, C.   Encounter Date: 06/27/2018  PT End of Session - 06/27/18 1736    Visit Number  7    Number of Visits  14    Date for PT Re-Evaluation  07/30/18    Authorization Type  MCR MCD    PT Start Time  1409    PT Stop Time  1502    PT Time Calculation (min)  53 min    Activity Tolerance  Patient tolerated treatment well    Behavior During Therapy  WFL for tasks assessed/performed       Past Medical History:  Diagnosis Date  . Acid reflux   . Allergy    seasonal  . Anxiety   . Arthritis    lower back, feet  . Asthma    albuterol inhaler - rarely uses - only needs when she gets sick  . Back pain   . Bilateral pulmonary embolism ([place]) 05/2016  . Bipolar disorder ([place])   . CAD in native artery    S/P NSTEMI with cath showing severe 3 vessel ASCAD s/p CABGx4 07/28/16.  . Cataracts, bilateral   . CKD (chronic kidney disease), stage III (HCC)    borderline CKD II-III  . Cocaine abuse ([place])    In remission. Stopped using in early 2000's  . Depression   . Diabetes mellitus ([place])    Type II  . GERD (gastroesophageal reflux disease)   . Glaucoma   . High cholesterol   . History of blood transfusion 2017  . History of pulmonary embolus (PE)   . 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 Hx of chest tube placement right   . Hypertension   . Morbid obesity ([place])   . Myocardial infarction ([place]) 2017  . Neuromuscular disorder (HCC)    neuropathy in hands and feet  . Neuropathy    hands and feet  . Pneumonia   . Postoperative anemia 07/2016  . PTSD (post-traumatic stress disorder)   . Sleep apnea    Does not use cpap  . Stroke [facility]) 2005   no residual  . SVD (spontaneous vaginal delivery)    x  4    Past Surgical History:  Procedure Laterality Date  . CARDIAC CATHETERIZATION N/A 07/25/2016   Procedure: Left Heart Cath and Coronary Angiography;  Surgeon: Peter M Martinique, MD;  Location: [facility];  Service: Cardiovascular;  Laterality: N/A;  . COLONOSCOPY    . COLONOSCOPY W/ POLYPECTOMY    . CORONARY ARTERY BYPASS GRAFT N/A 07/28/2016   Procedure: CORONARY ARTERY BYPASS GRAFTING (CABG)x4 using left internal mammary and endoscopic harvest of right greater saphenous vein;  Surgeon: Ivin Poot, MD;  Location: [facility];  Service: Open Heart Surgery;  Laterality: N/A;  . DENTAL SURGERY     to removed broken teeth  . HYSTEROSCOPY W/D&C N/A 01/31/2018   Procedure: DILATATION AND CURETTAGE /HYSTEROSCOPY;  Surgeon: Woodroe Mode, MD;  Location: [facility];  Service: Gynecology;  Laterality: N/A;  . POLYPECTOMY    . PULMONARY EMBOLISM SURGERY  2017   [facility]  . SHOULDER ARTHROSCOPY WITH ROTATOR CUFF REPAIR Left 02/19/2017   Procedure: LEFT SHOULDER ARTHROSCOPY with debridement andROTATOR CUFF REPAIR;  Surgeon: [name], MD;  Location: [facility];  Service: Orthopedics;  Laterality: Left;  . TEE WITHOUT CARDIOVERSION N/A 07/28/2016   Procedure: TRANSESOPHAGEAL ECHOCARDIOGRAM (TEE);  Surgeon: Ivin Poot, MD;  Location: [place] [facility];  Service: Open Heart Surgery;  Laterality: N/A;  . TONSILLECTOMY    . TUBAL LIGATION      There were no vitals filed for this visit.  Subjective Assessment - 06/27/18 1411    Subjective  Primary complaint today is left plantar heel pain. No back pain pre-tx. but notes LBP if excessive standing and walking.    Currently in Pain?  Yes    Pain Score  6     Pain Location  Heel    Pain Orientation  Left   plantar aspect   Pain Descriptors / Indicators  Sharp    Pain Type  Acute pain    Pain Onset  --   previous ankle pain onset >1 month ago, referring more into heel in the past several days   Pain Frequency  Intermittent    Aggravating Factors   standing  and walking    Pain Relieving Factors  ice    Effect of Pain on Daily Activities  Limits walking tolerance for ADLs/IADLs    Multiple Pain Sites  No                       [facility] Adult PT Treatment/Exercise - 06/27/18 0001      Modalities   Modalities  Ultrasound      Moist Heat Therapy   Number Minutes Moist Heat  10 Minutes    Moist Heat Location  Lumbar Spine   per pt. request post-tx.     Ultrasound   Ultrasound Location  Left plantar aspect of heel and distal peroneal tendon region    Ultrasound Parameters  3 MHZ 100% 1.0 W/cm2    Ultrasound Goals  Pain      Manual Therapy   Manual Therapy  Joint mobilization;Soft tissue mobilization    Joint Mobilization  talocrural mobilization grade I-III AP and distraction    Soft tissue mobilization  STM with focus trigger point STM left medial gastroc, also STM left plantar fascia      Ankle Exercises: Stretches   Plantar Fascia Stretch  3 reps;30 seconds   supine with assist   Soleus Stretch  30 seconds;3 reps   supine with assist   Gastroc Stretch  30 seconds;3 reps   supine manual stretch   Other Stretch  standing left gastroc stretch with hands on counter 3x30 sec             PT Education - 06/27/18 1735    Education Details  HEP update-see pt. instructions, etiology ankle tendinitis vs. plantar fascia pain    Person(s) Educated  Patient    Methods  Explanation;Handout    Comprehension  Verbalized understanding;Returned demonstration;Verbal cues required       PT Short Term Goals - 06/18/18 1527      PT SHORT TERM GOAL #1   Title  Pt. will be independent with initial HEP    Time  3    Period  Weeks    Status  On-going      PT SHORT TERM GOAL #2   Title  She will report 30% decr in intensity of episodes of LBP    Time  3    Period  Weeks    Status  Achieved        PT Long Term Goals - 06/18/18  [place] #2   Title  She will report episodes of LBP and LT ankle to dec  to once over 2 weeks     Time  4    Period  Weeks    Status  On-going      PT LONG TERM GOAL #3   Title  Perform ADLs/IADLs such as chores at home, waiting at bus stop, brief trips to grocery store with back and ankle pain 3/10 or less    Time  4    Period  Weeks    Status  Revised      PT LONG TERM GOAL #4   Title  Pt. to ambulate for short trips to grocery store at least 20 min without needing to use motorized cart.    Baseline  intermittently uses cart due to pain    Time  4    Period  Weeks    Status  New      PT LONG TERM GOAL #5   Title  Left ankle strength grossly 5/5 to improve stability for walking outdoors over uneven surfaces    Time  4    Period  Weeks    Status  New            Plan - 06/27/18 1738    Clinical Impression Statement  Back improving and previous peroneal tendon region pain improving. Suspect plantar heel pain could be consistent with plantar fasciitis with contributing pes plan. Good tx. response today from manual work and modalities, stretching to ease pain post-tx.    Rehab Potential  Fair    Clinical Impairments Affecting Rehab Potential  chronic LBP history, comorbidities    PT Frequency  2x / week    PT Duration  4 weeks    PT Treatment/Interventions  Manual techniques;Taping;Dry needling;Patient/family education;Therapeutic exercise;Therapeutic activities;Moist Heat;Neuromuscular re-education;Cryotherapy;Ultrasound;Iontophoresis 4mg /ml Dexamethasone;Gait training;Functional mobility training    PT Next Visit Plan  Tx. focus left foot/ankle vs. back pending area of most pain, continue gastroc and plantar fascia stretching, STM left gastroc and foot, ankle strengthening, foot instrinsic ROM and strengthening, if needed for back resume/continue previous mat-based exercises and STM    PT Home Exercise Plan  PPT, quadratus stretch, tennis ball STW, ankle isometrics and theraband 4-way, calf stretch,  hip flexor stretch.-updated to include standing  gastroc stretch     Consulted and Agree with Plan of Care  Patient       Patient will benefit from skilled therapeutic intervention in order to improve the following deficits and impairments:  Pain, Increased muscle spasms, Postural dysfunction, Decreased range of motion, Decreased strength, Abnormal gait, Decreased activity tolerance, Difficulty walking, Obesity  Visit Diagnosis: Pain in left ankle and joints of left foot  Chronic right-sided low back pain without sciatica  Muscle spasm of back  Abnormal posture     Problem List Patient Active Problem List   Diagnosis Date Noted  . Tendonitis of ankle, left 05/14/2018  . Abnormal LFTs (liver function tests)   . Abdominal pain, RUQ   . Acute renal failure superimposed on chronic kidney disease ([place])   . Epigastric pain   . Transaminitis 04/30/2018  . Left hand pain 04/18/2018  . Sprain of ankle 03/25/2018  . Pain in lower jaw 01/16/2018  . Healthcare maintenance 12/11/2017  . Vaginal bleeding 10/09/2017  . Postmenopausal vaginal bleeding 10/04/2017  . Cough 09/28/2017  . Chronic pansinusitis 09/28/2017  . Laryngopharyngeal reflux (LPR) 08/28/2017  .  Sensorineural hearing loss (SNHL), bilateral 08/28/2017  . Diarrhea in adult patient 08/04/2017  . Arm pain, anterior, right 06/17/2017  . OSA on CPAP 05/03/2017  . Morbid obesity due to excess calories ([place]) 05/03/2017  . Microscopic hematuria 03/27/2017  . Impingement syndrome of left shoulder 02/19/2017  . Hypersomnia 12/12/2016  . REM behavioral disorder 12/12/2016  . Asthma 12/12/2016  . Neck pain 11/17/2016  . CKD (chronic kidney disease), stage III ([place]) 11/02/2016  . CAD (coronary artery disease), native coronary artery 10/26/2016  . Poor social situation 10/13/2016  . Leg pain 08/30/2016  . Back pain 08/30/2016  . 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 GERD (gastroesophageal reflux disease) 06/09/2016  . Obesity (BMI 30-39.9) 06/09/2016  . Facial swelling 05/21/2016   . Hyperlipidemia 05/17/2016  . Depression 05/17/2016  . Diabetic peripheral neuropathy ([place]) 05/17/2016  . Neuritis of upper extremity, C7 01/15/2015  . Hypertension 01/14/2015  . Numbness and tingling of right arm 01/14/2015  . Proliferative diabetic retinopathy ([place]) 02/24/2014  . Diabetes mellitus ([place]) 12/18/2013  . Nuclear cataract of both eyes 12/18/2013    Beaulah Dinning, PT, DPT 06/27/18 5:43 PM  [facility] [address] [place], [place], [address] Phone: [phone]   Fax:  [phone]  Name: Tamara Henry MRN: [id number] Date of Birth: 12-27-1960

## 2018-06-27 NOTE — Patient Instructions (Signed)
Standing Gastroc Stretch reps: 2-3 sets: 1 daily: 1 weekly: 7   Exercise image step 1   Exercise image step 2 left leg back, keep left knee straight, can try at kitchen counter  Setup  Begin in a standing position with your feet in a staggered stance, holding onto a stable surface for support. Movement  Keeping your back knee straight, push your hips forward. You will feel a stretch in the back of your lower leg. Tip  Make sure to keep both feet pointed straight forward and flat on the ground during the stretch.

## 2018-07-01 ENCOUNTER — Ambulatory Visit: Payer: Medicare Other | Admitting: Physical Therapy

## 2018-07-03 ENCOUNTER — Ambulatory Visit (INDEPENDENT_AMBULATORY_CARE_PROVIDER_SITE_OTHER): Payer: Medicare Other | Admitting: Orthopaedic Surgery

## 2018-07-03 ENCOUNTER — Encounter (INDEPENDENT_AMBULATORY_CARE_PROVIDER_SITE_OTHER): Payer: Self-pay | Admitting: Orthopaedic Surgery

## 2018-07-03 VITALS — BP 121/73 | HR 73 | Ht 64.0 in | Wt 233.0 lb

## 2018-07-03 DIAGNOSIS — E669 Obesity, unspecified: Secondary | ICD-10-CM | POA: Diagnosis not present

## 2018-07-03 DIAGNOSIS — M5126 Other intervertebral disc displacement, lumbar region: Secondary | ICD-10-CM | POA: Diagnosis not present

## 2018-07-03 DIAGNOSIS — Z6838 Body mass index (BMI) 38.0-38.9, adult: Secondary | ICD-10-CM | POA: Diagnosis not present

## 2018-07-03 NOTE — Progress Notes (Addendum)
Office Visit Note   Patient: Tamara Henry           Date of Birth: Jan 09, 1961           MRN: [id number] Visit Date: 07/03/2018              Requested by: Carlyle Dolly, MD [place], [place] [address] PCP: Kathrene Alu, MD   Assessment & Plan: Visit Diagnoses:  1. Lumbar disc herniation   2. Body mass index 38.0-38.9, adult   3. Obesity (BMI 30-39.9)             With complications , DM,HTN,   Plan: We will refer her to Dr. Ernestina Patches for epidural injection likely at left L5-S1.  She got relief for many months when she had one previously.  She left a watch her diabetes carefully and we discussed increasing her insulin to handle hypoglycemia.  I will recheck her in 6 weeks.  We discussed and encouraged working on her weight loss to decrease problems with her comorbidities including diabetes, hypertension, hyperlipidemia, coronary artery disease with previous CABG.  Follow-Up Instructions: Return in about 6 weeks (around 08/14/2018).   Orders:  No orders of the defined types were placed in this encounter.  No orders of the defined types were placed in this encounter.     Procedures: No procedures performed   Clinical Data: No additional findings.   Subjective: Chief Complaint  Patient presents with  . Lower Back - Pain, Follow-up  . Left Shoulder - Pain, Follow-up    HPI 57 year old female returns she states her shoulder is doing well after debridement rotator cuff repair.  Her knees been doing better work where she has some right knee osteoarthritis.  She continues to have significant problems with her back and left leg pain states she is having trouble going to the store has problems when she is cooking and standing has to sit down to get relief.  She does better when she leans over a grocery cart to some extent.  She denies any radicular symptoms in her right leg.  Previous MRI 06/15/2017 showed some mild stenosis at L3-4 and L4-5.  Left posterior  lateral disc herniation with some compression on the left.  Pain radiates down her left to the lateral calf and stops above the ankle.  She gets relief with supine position.  Try to work on some weight loss and her BMI today is 39.99.  She has diabetes takes 50 of Lantus at night and then as a sliding scale with meals.  Patient states her back pain in the last month has become "horrible".  She is been trying to walk and exercise and states she is unable to do due to back and left leg pain.  Review of Systems 14 pt  reviewed updated unchanged from 06/27/2017.  Of note is kidney disease, diabetes, history of pulmonary embolism, hypertension, CABG x4, rotator cuff repair, low back pain   Objective: Vital Signs: BP 121/73   Pulse 73   Ht 5\' 4"  (1.626 m)   Wt 233 lb (105.7 kg)   LMP  (LMP Unknown)   BMI 39.99 kg/m   Physical Exam  Constitutional: She is oriented to person, place, and time. She appears well-developed.  HENT:  Head: Normocephalic.  Right Ear: External ear normal.  Left Ear: External ear normal.  Eyes: Pupils are equal, round, and reactive to light.  Neck: No tracheal deviation present. No thyromegaly present.  Cardiovascular: Normal  rate.  Healed median sternotomy scar  Pulmonary/Chest: Effort normal.  Abdominal: Soft.  Neurological: She is alert and oriented to person, place, and time.  Skin: Skin is warm and dry.  Psychiatric: She has a normal mood and affect. Her behavior is normal.    Ortho Exam patient can get her arm up overhead negative drop arm test.  Good cervical range of motion.  Some pain with straight leg raising on the left positive sciatic notch tenderness some lumbar tenderness.  No knee effusion on the right knee.  Pain with hip range of motion.  Anterior tib gastrocsoleus EHL is strong.  Specialty Comments:  No specialty comments available.  Imaging: No results found.   PMFS History: Patient Active Problem List   Diagnosis Date Noted  .  Tendonitis of ankle, left 05/14/2018  . Abnormal LFTs (liver function tests)   . Abdominal pain, RUQ   . Acute renal failure superimposed on chronic kidney disease ([place])   . Epigastric pain   . Transaminitis 04/30/2018  . Left hand pain 04/18/2018  . Sprain of ankle 03/25/2018  . Pain in lower jaw 01/16/2018  . Healthcare maintenance 12/11/2017  . Vaginal bleeding 10/09/2017  . Postmenopausal vaginal bleeding 10/04/2017  . Cough 09/28/2017  . Chronic pansinusitis 09/28/2017  . Laryngopharyngeal reflux (LPR) 08/28/2017  . Sensorineural hearing loss (SNHL), bilateral 08/28/2017  . Diarrhea in adult patient 08/04/2017  . Arm pain, anterior, right 06/17/2017  . OSA on CPAP 05/03/2017  . Morbid obesity due to excess calories ([place]) 05/03/2017  . Microscopic hematuria 03/27/2017  . Impingement syndrome of left shoulder 02/19/2017  . Hypersomnia 12/12/2016  . REM behavioral disorder 12/12/2016  . Asthma 12/12/2016  . Neck pain 11/17/2016  . CKD (chronic kidney disease), stage III ([place]) 11/02/2016  . CAD (coronary artery disease), native coronary artery 10/26/2016  . Poor social situation 10/13/2016  . Leg pain 08/30/2016  . Back pain 08/30/2016  . 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 GERD (gastroesophageal reflux disease) 06/09/2016  . Obesity (BMI 30-39.9) 06/09/2016  . Facial swelling 05/21/2016  . Hyperlipidemia 05/17/2016  . Depression 05/17/2016  . Diabetic peripheral neuropathy ([place]) 05/17/2016  . Neuritis of upper extremity, C7 01/15/2015  . Hypertension 01/14/2015  . Numbness and tingling of right arm 01/14/2015  . Proliferative diabetic retinopathy ([place]) 02/24/2014  . Diabetes mellitus ([place]) 12/18/2013  . Nuclear cataract of both eyes 12/18/2013   Past Medical History:  Diagnosis Date  . Acid reflux   . Allergy    seasonal  . Anxiety   . Arthritis    lower back, feet  . Asthma    albuterol inhaler - rarely uses - only needs when she gets sick  . Back pain   . Bilateral  pulmonary embolism ([place]) 05/2016  . Bipolar disorder ([place])   . CAD in native artery    S/P NSTEMI with cath showing severe 3 vessel ASCAD s/p CABGx4 07/28/16.  . Cataracts, bilateral   . CKD (chronic kidney disease), stage III (HCC)    borderline CKD II-III  . Cocaine abuse ([place])    In remission. Stopped using in early 2000's  . Depression   . Diabetes mellitus ([place])    Type II  . GERD (gastroesophageal reflux disease)   . Glaucoma   . High cholesterol   . History of blood transfusion 2017  . History of pulmonary embolus (PE)   . 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 Hx of chest tube placement right   .  Hypertension   . Morbid obesity ([place])   . Myocardial infarction ([place]) 2017  . Neuromuscular disorder (HCC)    neuropathy in hands and feet  . Neuropathy    hands and feet  . Pneumonia   . Postoperative anemia 07/2016  . PTSD (post-traumatic stress disorder)   . Sleep apnea    Does not use cpap  . Stroke [facility]) 2005   no residual  . SVD (spontaneous vaginal delivery)    x 4    Family History  Problem Relation Age of Onset  . Pancreatitis Mother   . Diabetes type II Mother   . Diabetes Mother   . CAD Father   . Heart attack Father   . Diabetes type II Sister   . Diabetes Sister   . Diabetes type II Brother   . Diabetes Brother   . Diabetes Brother   . Diabetes Brother   . Clotting disorder Son        heart attack x 3   . Breast cancer Maternal Aunt   . Colon cancer Neg Hx   . Stomach cancer Neg Hx     Past Surgical History:  Procedure Laterality Date  . CARDIAC CATHETERIZATION N/A 07/25/2016   Procedure: Left Heart Cath and Coronary Angiography;  Surgeon: Peter M Martinique, MD;  Location: [facility];  Service: Cardiovascular;  Laterality: N/A;  . COLONOSCOPY    . COLONOSCOPY W/ POLYPECTOMY    . CORONARY ARTERY BYPASS GRAFT N/A 07/28/2016   Procedure: CORONARY ARTERY BYPASS GRAFTING (CABG)x4 using left internal mammary and endoscopic harvest of right greater saphenous  vein;  Surgeon: Ivin Poot, MD;  Location: [facility];  Service: Open Heart Surgery;  Laterality: N/A;  . DENTAL SURGERY     to removed broken teeth  . HYSTEROSCOPY W/D&C N/A 01/31/2018   Procedure: DILATATION AND CURETTAGE /HYSTEROSCOPY;  Surgeon: Woodroe Mode, MD;  Location: [facility];  Service: Gynecology;  Laterality: N/A;  . POLYPECTOMY    . PULMONARY EMBOLISM SURGERY  2017   [facility]  . SHOULDER ARTHROSCOPY WITH ROTATOR CUFF REPAIR Left 02/19/2017   Procedure: LEFT SHOULDER ARTHROSCOPY with debridement andROTATOR CUFF REPAIR;  Surgeon: [name], MD;  Location: [facility];  Service: Orthopedics;  Laterality: Left;  . TEE WITHOUT CARDIOVERSION N/A 07/28/2016   Procedure: TRANSESOPHAGEAL ECHOCARDIOGRAM (TEE);  Surgeon: Ivin Poot, MD;  Location: [facility];  Service: Open Heart Surgery;  Laterality: N/A;  . TONSILLECTOMY    . TUBAL LIGATION     Social History   Occupational History  . Occupation: disabled  Tobacco Use  . Smoking status: Former Smoker    Packs/day: 2.00    Years: 10.00    Pack years: 20.00    Types: Cigarettes    Last attempt to quit: 09/11/2005    Years since quitting: 12.8  . Smokeless tobacco: Never Used  Substance and Sexual Activity  . Alcohol use: No  . Drug use: Not Currently    Types: Cocaine    Comment: History Cocaine - last use in 2000's  . Sexual activity: Not Currently    Birth control/protection: Surgical

## 2018-07-03 NOTE — Addendum Note (Signed)
Addended by: Meyer Cory on: 07/03/2018 10:41 AM   Modules accepted: Orders

## 2018-07-08 ENCOUNTER — Other Ambulatory Visit: Payer: Self-pay | Admitting: *Deleted

## 2018-07-08 NOTE — Patient Outreach (Signed)
[facility]) Care Management  07/08/2018  Tamara Henry [birth year] [id number]   RN Health Coach Monthly Outreach  Referral Date:04/17/2018 Referral Source:EMMI Prevent Screening Reason for Referral:Disease Management Education, Medication compliance packaging Insurance:[employer] Medicare   Outreach Attempt:  Unsuccessful telephone outreach to patient for monthly follow up.  Line went straight to voicemail.  Unable to leave voice message due to mailbox being full.  Plan:  RN Health Coach will send Unsuccessful Outreach Letter to patient.  RN Health Coach will attempt another outreach to patient in the month of November.  [place] [phone] [name]

## 2018-07-11 ENCOUNTER — Other Ambulatory Visit: Payer: Self-pay | Admitting: Family Medicine

## 2018-07-11 ENCOUNTER — Ambulatory Visit: Payer: Medicare Other | Admitting: Physical Therapy

## 2018-07-12 ENCOUNTER — Ambulatory Visit: Payer: Medicare Other | Admitting: Podiatry

## 2018-07-12 ENCOUNTER — Ambulatory Visit (INDEPENDENT_AMBULATORY_CARE_PROVIDER_SITE_OTHER): Payer: Medicare Other | Admitting: Family Medicine

## 2018-07-12 ENCOUNTER — Encounter: Payer: Self-pay | Admitting: Licensed Clinical Social Worker

## 2018-07-12 ENCOUNTER — Other Ambulatory Visit: Payer: Self-pay

## 2018-07-12 ENCOUNTER — Ambulatory Visit: Payer: Medicare Other | Admitting: Licensed Clinical Social Worker

## 2018-07-12 VITALS — BP 118/60 | HR 71 | Temp 97.6°F | Ht 64.0 in | Wt 233.0 lb

## 2018-07-12 DIAGNOSIS — Z23 Encounter for immunization: Secondary | ICD-10-CM

## 2018-07-12 DIAGNOSIS — R05 Cough: Secondary | ICD-10-CM

## 2018-07-12 DIAGNOSIS — R197 Diarrhea, unspecified: Secondary | ICD-10-CM

## 2018-07-12 DIAGNOSIS — R059 Cough, unspecified: Secondary | ICD-10-CM

## 2018-07-12 DIAGNOSIS — Z139 Encounter for screening, unspecified: Secondary | ICD-10-CM

## 2018-07-12 MED ORDER — FLUTICASONE PROPIONATE HFA 44 MCG/ACT IN AERO: 2.0000 | INHALATION_SPRAY | Freq: Every day | RESPIRATORY_TRACT | 12 refills | Status: AC

## 2018-07-12 MED ORDER — ALBUTEROL SULFATE (2.5 MG/3ML) 0.083% IN NEBU
2.5000 mg | INHALATION_SOLUTION | Freq: Once | RESPIRATORY_TRACT | Status: AC
  Administered 2018-07-12: 2.5 mg via RESPIRATORY_TRACT

## 2018-07-12 MED ORDER — ZINC OXIDE 40 % EX OINT: 1.0000 "application " | TOPICAL_OINTMENT | CUTANEOUS | 0 refills | Status: AC | PRN

## 2018-07-12 NOTE — Patient Instructions (Addendum)
Thank you for coming to see me today. It was a pleasure! Today we talked about:   Simethicone is the off brand for Gas-ex or bean-o which may help with your bloating. You may continue the immodium to see if that helps as well. Please try Desitin barrier cream for your rash. I have sent this to your pharmacy, but it may be cheaper over the counter.   I sent Flovent to your pharmacy, please use this everyday and rinse with water afterwards, and this should prevent you from needing your albuterol. Please use the albuterol if you are feeling short of breath or wheezing. Please return if you are short of breath.   Please follow-up with Dr. Shan Levans on 11/14 at 4:00pm for follow up.  If you have any questions or concerns, please do not hesitate to call the office at [phone].  Take Care,   [place] Eliazer Hemphill, DO
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
## 2018-07-12 NOTE — Progress Notes (Signed)
Subjective:    Patient ID: Tamara Henry, female    DOB: 08-09-1961, 57 y.o.   MRN: [id number]   CC: gas pains and bloating  HPI: Gas pains and bloating: Patient reports that she is having 3 days of cramping in the middle of her stomach.  Patient reports that she is having a lot of gas, this is been more than she normally has.  Patient reports that she was having diarrhea, however she always has diarrhea.  Patient denies any constipation.  Patient reports that she has a rash on her bottom given that she is having so much diarrhea.  Patient denies any sick contacts. Patient had colonoscopy 1 year ago that was normal but am unable to find this report in her chart. ROS: Per above.  Denies fevers, blood in stool  Cough: Patient reports that she has been using her albuterol 2-3 times per day.  She reports that she has had a cough that has been productive.  Patient reports some intermittent shortness of breath and wheezing.  Patient does have a history of asthma and is currently not on any controller medications.  Smoking status reviewed  ROS: 10 point ROS is otherwise negative, except as mentioned in HPI  Patient Active Problem List   Diagnosis Date Noted  . Tendonitis of ankle, left 05/14/2018  . Abnormal LFTs (liver function tests)   . Abdominal pain, RUQ   . Acute renal failure superimposed on chronic kidney disease ([place])   . Epigastric pain   . Transaminitis 04/30/2018  . Left hand pain 04/18/2018  . Sprain of ankle 03/25/2018  . Pain in lower jaw 01/16/2018  . Healthcare maintenance 12/11/2017  . Vaginal bleeding 10/09/2017  . Postmenopausal vaginal bleeding 10/04/2017  . Cough 09/28/2017  . Chronic pansinusitis 09/28/2017  . Laryngopharyngeal reflux (LPR) 08/28/2017  . Sensorineural hearing loss (SNHL), bilateral 08/28/2017  . Diarrhea in adult patient 08/04/2017  . Arm pain, anterior, right 06/17/2017  . OSA on CPAP 05/03/2017  . Morbid obesity due to excess calories ([place])  05/03/2017  . Microscopic hematuria 03/27/2017  . Impingement syndrome of left shoulder 02/19/2017  . Hypersomnia 12/12/2016  . REM behavioral disorder 12/12/2016  . Asthma 12/12/2016  . Neck pain 11/17/2016  . CKD (chronic kidney disease), stage III ([place]) 11/02/2016  . CAD (coronary artery disease), native coronary artery 10/26/2016  . Poor social situation 10/13/2016  . Leg pain 08/30/2016  . Back pain 08/30/2016  . 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 GERD (gastroesophageal reflux disease) 06/09/2016  . Obesity (BMI 30-39.9) 06/09/2016  . Facial swelling 05/21/2016  . Hyperlipidemia 05/17/2016  . Depression 05/17/2016  . Diabetic peripheral neuropathy ([place]) 05/17/2016  . Neuritis of upper extremity, C7 01/15/2015  . Hypertension 01/14/2015  . Numbness and tingling of right arm 01/14/2015  . Proliferative diabetic retinopathy ([place]) 02/24/2014  . Diabetes mellitus ([place]) 12/18/2013  . Nuclear cataract of both eyes 12/18/2013     Objective:  BP 118/60   Pulse 71   Temp 97.6 F (36.4 C) (Oral)   Ht 5\' 4"  (1.626 m)   Wt 233 lb (105.7 kg)   LMP  (LMP Unknown)   SpO2 95%   BMI 39.99 kg/m  Vitals and nursing note reviewed  General: NAD, pleasant Cardiac: RRR, normal heart sounds, no murmurs Respiratory: CTAB, normal effort Abdomen: soft, nontender, nondistended, no masses or hernias felt Extremities: no edema or cyanosis. WWP. Skin: warm and dry, no rashes noted Neuro: alert and oriented, no focal deficits  Psych: normal affect  Assessment & Plan:    Diarrhea in adult patient Patient with history of chronic diarrhea.  However now patient having increasing gas and cramping, no blood in stool or weight loss.  No red flag symptoms.  Patient has not tried anything for this.  Suggested that patient try simethicone over-the-counter as needed for her gas.  Patient reports normal colonoscopy 1 year ago however unable to find this on chart review.  Patient previously seen for this diarrhea  and gas with differentials including malabsorption issue versus IBD versus IBS.  Patient unlikely to have upper GI bleed given that she has normal EGD documented in chart. -Previously referred to GI but unclear if she was seen by them recently. -Scheduled to follow-up appointment with PCP to further discuss this chronic problem. -Patient given strict return precautions. -Patient tried Desitin on her rash in order to help.  Cough Patient with history of asthma currently on albuterol which she has had to be using 2-3 times per day with recent change in season.  Will start patient on Flovent to use daily.  Patient encouraged to place his next to her sink in order to use it daily and then rinse her mouth out with water afterwards.  [place] Keilen Kahl, DO Family Medicine Resident PGY-2

## 2018-07-16 ENCOUNTER — Encounter: Payer: Self-pay | Admitting: Physical Therapy

## 2018-07-16 ENCOUNTER — Ambulatory Visit: Payer: Medicare Other | Attending: Family Medicine | Admitting: Physical Therapy

## 2018-07-16 ENCOUNTER — Telehealth: Payer: Self-pay | Admitting: Family Medicine

## 2018-07-16 DIAGNOSIS — M545 Low back pain: Secondary | ICD-10-CM | POA: Diagnosis not present

## 2018-07-16 DIAGNOSIS — M62830 Muscle spasm of back: Secondary | ICD-10-CM | POA: Diagnosis not present

## 2018-07-16 DIAGNOSIS — G8929 Other chronic pain: Secondary | ICD-10-CM | POA: Diagnosis not present

## 2018-07-16 DIAGNOSIS — R293 Abnormal posture: Secondary | ICD-10-CM | POA: Diagnosis not present

## 2018-07-16 DIAGNOSIS — M25572 Pain in left ankle and joints of left foot: Secondary | ICD-10-CM | POA: Insufficient documentation

## 2018-07-16 NOTE — Telephone Encounter (Signed)
[employer] form dropped off for transportation at front desk for completion.  Verified that patient section of form has been completed.  Last DOS/WCC with PCP was 05/10/2018.  Placed form in team folder to be completed by clinical staff.  Tamara Henry

## 2018-07-16 NOTE — Therapy (Signed)
[facility] [place], [place], [address] Phone: [phone]   Fax:  [phone]  Physical Therapy Treatment  Patient Details  Name: Tamara Henry MRN: [id number] Date of Birth: Feb 14, 1961 Referring Provider (PT): Winfrey, A., Gambino, C.   Encounter Date: 07/16/2018  PT End of Session - 07/16/18 1929    Visit Number  8    Number of Visits  14    Date for PT Re-Evaluation  07/30/18    Authorization Type  MCR MCD    PT Start Time  1343    PT Stop Time  1425    PT Time Calculation (min)  42 min    Activity Tolerance  Patient tolerated treatment well    Behavior During Therapy  WFL for tasks assessed/performed       Past Medical History:  Diagnosis Date  . Acid reflux   . Allergy    seasonal  . Anxiety   . Arthritis    lower back, feet  . Asthma    albuterol inhaler - rarely uses - only needs when she gets sick  . Back pain   . Bilateral pulmonary embolism ([place]) 05/2016  . Bipolar disorder ([place])   . CAD in native artery    S/P NSTEMI with cath showing severe 3 vessel ASCAD s/p CABGx4 07/28/16.  . Cataracts, bilateral   . CKD (chronic kidney disease), stage III (H[facility])    borderline CKD II-III  . Cocaine abuse ([place])    In remission. Stopped using in early 2000's  . Depression   . Diabetes mellitus ([place])    Type II  . GERD (gastroesophageal reflux disease)   . Glaucoma   . High cholesterol   . History of blood transfusion 2017  . History of pulmonary embolus (PE)   . 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 Hx of chest tube placement right   . Hypertension   . Morbid obesity ([place])   . Myocardial infarction ([place]) 2017  . Neuromuscular disorder (H[facility])    neuropathy in hands and feet  . Neuropathy    hands and feet  . Pneumonia   . Postoperative anemia 07/2016  . PTSD (post-traumatic stress disorder)   . Sleep apnea    Does not use cpap  . Stroke [facility]) 2005   no residual  . SVD (spontaneous vaginal delivery)    x  4    Past Surgical History:  Procedure Laterality Date  . CARDIAC CATHETERIZATION N/A 07/25/2016   Procedure: Left Heart Cath and Coronary Angiography;  Surgeon: Peter M Martinique, MD;  Location: [facility];  Service: Cardiovascular;  Laterality: N/A;  . COLONOSCOPY    . COLONOSCOPY W/ POLYPECTOMY    . CORONARY ARTERY BYPASS GRAFT N/A 07/28/2016   Procedure: CORONARY ARTERY BYPASS GRAFTING (CABG)x4 using left internal mammary and endoscopic harvest of right greater saphenous vein;  Surgeon: Ivin Poot, MD;  Location: [facility];  Service: Open Heart Surgery;  Laterality: N/A;  . DENTAL SURGERY     to removed broken teeth  . HYSTEROSCOPY W/D&C N/A 01/31/2018   Procedure: DILATATION AND CURETTAGE /HYSTEROSCOPY;  Surgeon: Woodroe Mode, MD;  Location: [name];  Service: Gynecology;  Laterality: N/A;  . POLYPECTOMY    . PULMONARY EMBOLISM SURGERY  2017   [facility]  . SHOULDER ARTHROSCOPY WITH ROTATOR CUFF REPAIR Left 02/19/2017   Procedure: LEFT SHOULDER ARTHROSCOPY with debridement andROTATOR CUFF REPAIR;  Surgeon: [name], MD;  Location: [facility];  Service: Orthopedics;  Laterality: Left;  . TEE WITHOUT CARDIOVERSION N/A 07/28/2016   Procedure: TRANSESOPHAGEAL ECHOCARDIOGRAM (TEE);  Surgeon: Ivin Poot, MD;  Location: [facility];  Service: Open Heart Surgery;  Laterality: N/A;  . TONSILLECTOMY    . TUBAL LIGATION      There were no vitals filed for this visit.  Subjective Assessment - 07/16/18 1924    Subjective  Pt. arrived to clinic at 1:40/transportation droppd off. She reports thought she had 1:30 appointment-nothing on schedule noted but due to cancellation able to see for abbreviated session. Primary complaint today is left peroneal tendon region pain.    Currently in Pain?  Yes    Pain Score  6     Pain Location  Ankle    Pain Orientation  Left;Posterior;Lateral    Pain Descriptors / Indicators  Sharp    Pain Type  Acute pain    Pain Onset  More than a month ago    Pain  Frequency  Intermittent    Aggravating Factors   standing and walking    Pain Relieving Factors  ice    Effect of Pain on Daily Activities  limited tolerance ambulation    Multiple Pain Sites  No                       [facility] Adult PT Treatment/Exercise - 07/16/18 0001      Cryotherapy   Number Minutes Cryotherapy  10 Minutes    Cryotherapy Location  Ankle    Type of Cryotherapy  Ice pack      Ultrasound   Ultrasound Location  Left peroneal tendon    Ultrasound Parameters  3 MHZ 100% 1.0 W/cm2    Ultrasound Goals  Pain      Manual Therapy   Manual Therapy  Joint mobilization;Soft tissue mobilization    Joint Mobilization  talocrural mobilization AP grade I-III    Soft tissue mobilization  STM left peroneals      Ankle Exercises: Stretches   Plantar Fascia Stretch  3 reps;30 seconds    Soleus Stretch  3 reps;30 seconds    Gastroc Stretch  3 reps;30 seconds             PT Education - 07/16/18 1929    Education Details  HEP-gastroc/peroneal stretches, eccentrics    Person(s) Educated  Patient    Methods  Explanation;Demonstration    Comprehension  Verbalized understanding       PT Short Term Goals - 06/18/18 1527      PT SHORT TERM GOAL #1   Title  Pt. will be independent with initial HEP    Time  3    Period  Weeks    Status  On-going      PT SHORT TERM GOAL #2   Title  She will report 30% decr in intensity of episodes of LBP    Time  3    Period  Weeks    Status  Achieved        PT Long Term Goals - 06/18/18 1528      PT LONG TERM GOAL #2   Title  She will report episodes of LBP and LT ankle to dec to once over 2 weeks     Time  4    Period  Weeks    Status  On-going      PT LONG TERM GOAL #3   Title  Perform ADLs/IADLs such as chores at home, waiting  at bus stop, brief trips to grocery store with back and ankle pain 3/10 or less    Time  4    Period  Weeks    Status  Revised      PT LONG TERM GOAL #4   Title  Pt. to ambulate  for short trips to grocery store at least 20 min without needing to use motorized cart.    Baseline  intermittently uses cart due to pain    Time  4    Period  Weeks    Status  New      PT LONG TERM GOAL #5   Title  Left ankle strength grossly 5/5 to improve stability for walking outdoors over uneven surfaces    Time  4    Period  Weeks    Status  New            Plan - 07/16/18 1930    Clinical Impression Statement  Symptoms consistent with ongoing intermitent peroneal tendonitis. Previous plantar pain at last session resolved today. Expect continued progress will take ongoing HEP along with formal PT sessions-pt. would benefit from contind therapy for further progress to address mobility limitations.    Rehab Potential  Fair    Clinical Impairments Affecting Rehab Potential  chronic LBP history, comorbidities    PT Frequency  2x / week    PT Duration  4 weeks    PT Treatment/Interventions  Manual techniques;Taping;Dry needling;Patient/family education;Therapeutic exercise;Therapeutic activities;Moist Heat;Neuromuscular re-education;Cryotherapy;Ultrasound;Iontophoresis 4mg /ml Dexamethasone;Gait training;Functional mobility training    PT Next Visit Plan  Tx. focus left foot/ankle vs. back pending area of most pain, continue gastroc and peroneal stretching, STM left gastroc and foot, ankle strengthening, foot instrinsic ROM and strengthening, if needed for back resume/continue previous mat-based exercises and STM    PT Home Exercise Plan  PPT, quadratus stretch, tennis ball STW, ankle isometrics and theraband 4-way, calf stretch,  hip flexor stretch.-updated to include standing gastroc stretch     Consulted and Agree with Plan of Care  Patient       Patient will benefit from skilled therapeutic intervention in order to improve the following deficits and impairments:  Pain, Increased muscle spasms, Postural dysfunction, Decreased range of motion, Decreased strength, Abnormal gait,  Decreased activity tolerance, Difficulty walking, Obesity  Visit Diagnosis: Pain in left ankle and joints of left foot  Chronic right-sided low back pain without sciatica  Muscle spasm of back  Abnormal posture     Problem List Patient Active Problem List   Diagnosis Date Noted  . Tendonitis of ankle, left 05/14/2018  . Abnormal LFTs (liver function tests)   . Abdominal pain, RUQ   . Acute renal failure superimposed on chronic kidney disease ([place])   . Epigastric pain   . Transaminitis 04/30/2018  . Left hand pain 04/18/2018  . Sprain of ankle 03/25/2018  . Pain in lower jaw 01/16/2018  . Healthcare maintenance 12/11/2017  . Vaginal bleeding 10/09/2017  . Postmenopausal vaginal bleeding 10/04/2017  . Cough 09/28/2017  . Chronic pansinusitis 09/28/2017  . Laryngopharyngeal reflux (LPR) 08/28/2017  . Sensorineural hearing loss (SNHL), bilateral 08/28/2017  . Diarrhea in adult patient 08/04/2017  . Arm pain, anterior, right 06/17/2017  . OSA on CPAP 05/03/2017  . Morbid obesity due to excess calories ([place]) 05/03/2017  . Microscopic hematuria 03/27/2017  . Impingement syndrome of left shoulder 02/19/2017  . Hypersomnia 12/12/2016  . REM behavioral disorder 12/12/2016  . Asthma 12/12/2016  . Neck pain 11/17/2016  .  CKD (chronic kidney disease), stage III ([place]) 11/02/2016  . CAD (coronary artery disease), native coronary artery 10/26/2016  . Poor social situation 10/13/2016  . Leg pain 08/30/2016  . Back pain 08/30/2016  . 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 GERD (gastroesophageal reflux disease) 06/09/2016  . Obesity (BMI 30-39.9) 06/09/2016  . Facial swelling 05/21/2016  . Hyperlipidemia 05/17/2016  . Depression 05/17/2016  . Diabetic peripheral neuropathy ([place]) 05/17/2016  . Neuritis of upper extremity, C7 01/15/2015  . Hypertension 01/14/2015  . Numbness and tingling of right arm 01/14/2015  . Proliferative diabetic retinopathy ([place]) 02/24/2014  . Diabetes mellitus  ([place]) 12/18/2013  . Nuclear cataract of both eyes 12/18/2013    Beaulah Dinning, PT, DPT 07/16/18 7:34 PM  [facility] [address] [place], [place], [address] Phone: [phone]   Fax:  [phone]  Name: Tamara Henry MRN: [id number] Date of Birth: 1961-02-21

## 2018-07-17 NOTE — Telephone Encounter (Signed)
Clinical info completed on Sc[employer] form.  Place form in Dr. Maudie Flakes box for completion.  Katharina Caper, April D, [place]

## 2018-07-18 ENCOUNTER — Emergency Department (HOSPITAL_COMMUNITY)
Admission: EM | Admit: 2018-07-18 | Discharge: 2018-07-19 | Disposition: A | Discharge status: Home / Self Care | Payer: Medicare Other | Attending: Emergency Medicine | Admitting: Emergency Medicine

## 2018-07-18 ENCOUNTER — Other Ambulatory Visit: Payer: Self-pay

## 2018-07-18 ENCOUNTER — Encounter (HOSPITAL_COMMUNITY): Payer: Self-pay | Admitting: Emergency Medicine

## 2018-07-18 DIAGNOSIS — Z91040 Latex allergy status: Secondary | ICD-10-CM | POA: Insufficient documentation

## 2018-07-18 DIAGNOSIS — R1110 Vomiting, unspecified: Secondary | ICD-10-CM | POA: Insufficient documentation

## 2018-07-18 DIAGNOSIS — Z87891 Personal history of nicotine dependence: Secondary | ICD-10-CM | POA: Insufficient documentation

## 2018-07-18 DIAGNOSIS — E1122 Type 2 diabetes mellitus with diabetic chronic kidney disease: Secondary | ICD-10-CM | POA: Insufficient documentation

## 2018-07-18 DIAGNOSIS — Z79899 Other long term (current) drug therapy: Secondary | ICD-10-CM | POA: Insufficient documentation

## 2018-07-18 DIAGNOSIS — Z7982 Long term (current) use of aspirin: Secondary | ICD-10-CM | POA: Insufficient documentation

## 2018-07-18 DIAGNOSIS — R197 Diarrhea, unspecified: Secondary | ICD-10-CM | POA: Insufficient documentation

## 2018-07-18 DIAGNOSIS — J45909 Unspecified asthma, uncomplicated: Secondary | ICD-10-CM | POA: Diagnosis not present

## 2018-07-18 DIAGNOSIS — I2510 Atherosclerotic heart disease of native coronary artery without angina pectoris: Secondary | ICD-10-CM | POA: Diagnosis not present

## 2018-07-18 DIAGNOSIS — I959 Hypotension, unspecified: Secondary | ICD-10-CM | POA: Insufficient documentation

## 2018-07-18 DIAGNOSIS — Z951 Presence of aortocoronary bypass graft: Secondary | ICD-10-CM | POA: Diagnosis not present

## 2018-07-18 DIAGNOSIS — I129 Hypertensive chronic kidney disease with stage 1 through stage 4 chronic kidney disease, or unspecified chronic kidney disease: Secondary | ICD-10-CM | POA: Insufficient documentation

## 2018-07-18 DIAGNOSIS — R55 Syncope and collapse: Secondary | ICD-10-CM | POA: Diagnosis not present

## 2018-07-18 DIAGNOSIS — N183 Chronic kidney disease, stage 3 (moderate): Secondary | ICD-10-CM | POA: Diagnosis not present

## 2018-07-18 DIAGNOSIS — Z794 Long term (current) use of insulin: Secondary | ICD-10-CM | POA: Diagnosis not present

## 2018-07-18 LAB — BASIC METABOLIC PANEL
ANION GAP: 8 (ref 5–15)
BUN: 51 mg/dL — ABNORMAL HIGH (ref 6–20)
CHLORIDE: 110 mmol/L (ref 98–111)
CO2: 19 mmol/L — AB (ref 22–32)
Calcium: 7.9 mg/dL — ABNORMAL LOW (ref 8.9–10.3)
Creatinine, Ser: 2.11 mg/dL — ABNORMAL HIGH (ref 0.44–1.00)
GFR calc non Af Amer: 25 mL/min — ABNORMAL LOW (ref >60)
GFR, EST AFRICAN AMERICAN: 29 mL/min — AB (ref >60)
Glucose, Bld: 118 mg/dL — ABNORMAL HIGH (ref 70–99)
Potassium: 3.5 mmol/L (ref 3.5–5.1)
Sodium: 137 mmol/L (ref 135–145)

## 2018-07-18 LAB — CBC
HEMATOCRIT: 38.4 % (ref 36.0–46.0)
Hemoglobin: 11.2 g/dL — ABNORMAL LOW (ref 12.0–15.0)
MCH: 28 pg (ref 26.0–34.0)
MCHC: 29.2 g/dL — ABNORMAL LOW (ref 30.0–36.0)
MCV: 96 fL (ref 80.0–100.0)
NRBC: 0 % (ref 0.0–0.2)
PLATELETS: 178 10*3/uL (ref 150–400)
RBC: 4 MIL/uL (ref 3.87–5.11)
RDW: 13.4 % (ref 11.5–15.5)
WBC: 8.4 10*3/uL (ref 4.0–10.5)

## 2018-07-18 LAB — CBG MONITORING, ED: GLUCOSE-CAPILLARY: 108 mg/dL — AB (ref 70–99)

## 2018-07-18 LAB — I-STAT BETA HCG BLOOD, ED (MC, WL, AP ONLY): I-stat hCG, quantitative: <5 m[IU]/mL (ref <5)

## 2018-07-18 MED ORDER — ONDANSETRON HCL 4 MG/2ML IJ SOLN
4.0000 mg | Freq: Once | INTRAMUSCULAR | Status: AC
  Administered 2018-07-18: 4 mg via INTRAVENOUS
  Filled 2018-07-18: qty 2

## 2018-07-18 MED ORDER — SODIUM CHLORIDE 0.9 % IV BOLUS
1000.0000 mL | Freq: Once | INTRAVENOUS | Status: AC
  Administered 2018-07-19: 1000 mL via INTRAVENOUS

## 2018-07-18 NOTE — ED Notes (Signed)
BIB EMS from home. Per family pt had syncopal episode while sitting in a chair. Family states her sugar may have been low but they did not check it, they just fed her instead with the assumption. CBG 179 upon EMS arrival. Pt was also initially hypotensive, 88/60. Given approx 543ml NS, last pressure 834'P systolic. Pt is A/OX4.

## 2018-07-18 NOTE — Assessment & Plan Note (Addendum)
Patient with history of chronic diarrhea.  However now patient having increasing gas and cramping, no blood in stool or weight loss.  No red flag symptoms.  Patient has not tried anything for this.  Suggested that patient try simethicone over-the-counter as needed for her gas.  Patient reports normal colonoscopy 1 year ago however unable to find this on chart review.  Patient previously seen for this diarrhea and gas with differentials including malabsorption issue versus IBD versus IBS.  Patient unlikely to have upper GI bleed given that she has normal EGD documented in chart. -Previously referred to GI but unclear if she was seen by them recently. -Scheduled to follow-up appointment with PCP to further discuss this chronic problem. -Patient given strict return precautions. -Patient tried Desitin on her rash in order to help.

## 2018-07-18 NOTE — Assessment & Plan Note (Signed)
Patient with history of asthma currently on albuterol which she has had to be using 2-3 times per day with recent change in season.  Will start patient on Flovent to use daily.  Patient encouraged to place his next to her sink in order to use it daily and then rinse her mouth out with water afterwards.

## 2018-07-19 DIAGNOSIS — R55 Syncope and collapse: Secondary | ICD-10-CM | POA: Diagnosis not present

## 2018-07-19 LAB — URINALYSIS, ROUTINE W REFLEX MICROSCOPIC
Bilirubin Urine: NEGATIVE
GLUCOSE, UA: NEGATIVE mg/dL
KETONES UR: NEGATIVE mg/dL
NITRITE: NEGATIVE
PROTEIN: NEGATIVE mg/dL
Specific Gravity, Urine: 1.009 (ref 1.005–1.030)
pH: 5 (ref 5.0–8.0)

## 2018-07-19 LAB — CBG MONITORING, ED: GLUCOSE-CAPILLARY: 174 mg/dL — AB (ref 70–99)

## 2018-07-19 NOTE — ED Provider Notes (Signed)
[facility] EMERGENCY DEPARTMENT Provider Note   CSN: [id number] Arrival date & time: 07/18/18  2059     History   Chief Complaint Chief Complaint  Patient presents with  . Loss of Consciousness    HPI Tamara Henry is a 57 y.o. female.  HPI Patient presents to the emergency department with a loss of consciousness while sitting in a chair.  The patient states that she used her insulin but did not eat following this.  The patient states that she felt weak and dizzy following this.  The patient states that she is been having increased diarrhea from her baseline over the last 3 days.  The patient is also had several episodes of vomiting.  The patient states she has had no pain.  The patient denies chest pain, shortness of breath, headache,blurred vision, neck pain, fever, cough, weakness, numbness, dizziness, anorexia, edema, abdominal pain, nausea,  rash, back pain, dysuria, hematemesis, bloody stool, near syncope, or syncope. Past Medical History:  Diagnosis Date  . Acid reflux   . Allergy    seasonal  . Anxiety   . Arthritis    lower back, feet  . Asthma    albuterol inhaler - rarely uses - only needs when she gets sick  . Back pain   . Bilateral pulmonary embolism ([place]) 05/2016  . Bipolar disorder ([place])   . CAD in native artery    S/P NSTEMI with cath showing severe 3 vessel ASCAD s/p CABGx4 07/28/16.  . Cataracts, bilateral   . CKD (chronic kidney disease), stage III (HCC)    borderline CKD II-III  . Cocaine abuse ([place])    In remission. Stopped using in early 2000's  . Depression   . Diabetes mellitus ([place])    Type II  . GERD (gastroesophageal reflux disease)   . Glaucoma   . High cholesterol   . History of blood transfusion 2017  . History of pulmonary embolus (PE)   . 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 Hx of chest tube placement right   . Hypertension   . Morbid obesity ([place])   . Myocardial infarction ([place]) 2017  . Neuromuscular disorder (HCC)    neuropathy in hands and feet  . Neuropathy    hands and feet  . Pneumonia   . Postoperative anemia 07/2016  . PTSD (post-traumatic stress disorder)   . Sleep apnea    Does not use cpap  . Stroke [facility]) 2005   no residual  . SVD (spontaneous vaginal delivery)    x 4    Patient Active Problem List   Diagnosis Date Noted  . Tendonitis of ankle, left 05/14/2018  . Abnormal LFTs (liver function tests)   . Abdominal pain, RUQ   . Acute renal failure superimposed on chronic kidney disease ([place])   . Epigastric pain   . Transaminitis 04/30/2018  . Left hand pain 04/18/2018  . Sprain of ankle 03/25/2018  . Pain in lower jaw 01/16/2018  . Healthcare maintenance 12/11/2017  . Vaginal bleeding 10/09/2017  . Postmenopausal vaginal bleeding 10/04/2017  . Cough 09/28/2017  . Chronic pansinusitis 09/28/2017  . Laryngopharyngeal reflux (LPR) 08/28/2017  . Sensorineural hearing loss (SNHL), bilateral 08/28/2017  . Diarrhea in adult patient 08/04/2017  . Arm pain, anterior, right 06/17/2017  . OSA on CPAP 05/03/2017  . Morbid obesity due to excess calories ([place]) 05/03/2017  . Microscopic hematuria 03/27/2017  . Impingement syndrome of left shoulder 02/19/2017  . Hypersomnia 12/12/2016  . REM behavioral disorder  12/12/2016  . Asthma 12/12/2016  . Neck pain 11/17/2016  . CKD (chronic kidney disease), stage III ([place]) 11/02/2016  . CAD (coronary artery disease), native coronary artery 10/26/2016  . Poor social situation 10/13/2016  . Leg pain 08/30/2016  . Back pain 08/30/2016  . 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 GERD (gastroesophageal reflux disease) 06/09/2016  . Obesity (BMI 30-39.9) 06/09/2016  . Facial swelling 05/21/2016  . Hyperlipidemia 05/17/2016  . Depression 05/17/2016  . Diabetic peripheral neuropathy ([place]) 05/17/2016  . Neuritis of upper extremity, C7 01/15/2015  . Hypertension 01/14/2015  . Numbness and tingling of right arm 01/14/2015  . Proliferative diabetic retinopathy  ([place]) 02/24/2014  . Diabetes mellitus ([place]) 12/18/2013  . Nuclear cataract of both eyes 12/18/2013    Past Surgical History:  Procedure Laterality Date  . CARDIAC CATHETERIZATION N/A 07/25/2016   Procedure: Left Heart Cath and Coronary Angiography;  Surgeon: Peter M Martinique, MD;  Location: [facility];  Service: Cardiovascular;  Laterality: N/A;  . COLONOSCOPY    . COLONOSCOPY W/ POLYPECTOMY    . CORONARY ARTERY BYPASS GRAFT N/A 07/28/2016   Procedure: CORONARY ARTERY BYPASS GRAFTING (CABG)x4 using left internal mammary and endoscopic harvest of right greater saphenous vein;  Surgeon: Ivin Poot, MD;  Location: [facility];  Service: Open Heart Surgery;  Laterality: N/A;  . DENTAL SURGERY     to removed broken teeth  . HYSTEROSCOPY W/D&C N/A 01/31/2018   Procedure: DILATATION AND CURETTAGE /HYSTEROSCOPY;  Surgeon: Woodroe Mode, MD;  Location: [facility];  Service: Gynecology;  Laterality: N/A;  . POLYPECTOMY    . PULMONARY EMBOLISM SURGERY  2017   [facility]  . SHOULDER ARTHROSCOPY WITH ROTATOR CUFF REPAIR Left 02/19/2017   Procedure: LEFT SHOULDER ARTHROSCOPY with debridement andROTATOR CUFF REPAIR;  Surgeon: [name], MD;  Location: [facility];  Service: Orthopedics;  Laterality: Left;  . TEE WITHOUT CARDIOVERSION N/A 07/28/2016   Procedure: TRANSESOPHAGEAL ECHOCARDIOGRAM (TEE);  Surgeon: Ivin Poot, MD;  Location: [facility];  Service: Open Heart Surgery;  Laterality: N/A;  . TONSILLECTOMY    . TUBAL LIGATION       OB History    Gravida  5   Para  4   Term  4   Preterm      AB  1   Living  4     SAB      TAB  1   Ectopic      Multiple      Live Births               Home Medications    Prior to Admission medications   Medication Sig Start Date End Date Taking? Authorizing Provider  acetaminophen (TYLENOL) 650 MG CR tablet Take 1 tablet (650 mg total) by mouth every 8 (eight) hours as needed for pain. 04/25/18  Yes Winfrey, Alcario Drought, MD  albuterol  (PROVENTIL HFA;VENTOLIN HFA) 108 (90 Base) MCG/ACT inhaler Inhale 1-2 puffs into the lungs every 6 (six) hours as needed for wheezing or shortness of breath. 04/25/18  Yes Winfrey, Alcario Drought, MD  amLODipine (NORVASC) 10 MG tablet Take 1 tablet (10 mg total) by mouth daily. 04/25/18  Yes Winfrey, Alcario Drought, MD  aspirin 81 MG EC tablet Take 1 tablet (81 mg total) by mouth daily. 04/25/18  Yes Winfrey, Alcario Drought, MD  Biotin 5 MG TBDP Take 1 tablet (5 mg total) by mouth daily. 04/25/18  Yes Winfrey, Alcario Drought, MD  calcipotriene (DOVONOX)  0.005 % cream APPLY 1 APPLICATION TOPICALLY AS NEEDED (ITCHING). 06/14/18  Yes Winfrey, Alcario Drought, MD  carvedilol (COREG) 12.5 MG tablet Take 1 tablet (12.5 mg total) by mouth 2 (two) times daily with a meal. 04/25/18  Yes Winfrey, Alcario Drought, MD  citalopram (CELEXA) 40 MG tablet Take 1 pill (40 mg) daily Patient taking differently: Take 40 mg by mouth daily.  04/25/18  Yes Winfrey, Alcario Drought, MD  diphenoxylate-atropine (LOMOTIL) 2.5-0.025 MG tablet Take 1 tablet by mouth 4 (four) times daily as needed for diarrhea or loose stools. Take 1-2 tablets up to 4 times daily. 05/14/18  Yes Winfrey, Alcario Drought, MD  ferrous sulfate 325 (65 FE) MG tablet Take 1 tablet (325 mg total) by mouth daily with breakfast. 04/25/18  Yes Winfrey, Alcario Drought, MD  fluticasone (FLOVENT HFA) 44 MCG/ACT inhaler Inhale 2 puffs into the lungs daily. Rinse your mouth with water after use 07/12/18  Yes Enid Derry, Martinique, DO  folic acid (FOLVITE) 638 MCG tablet Take 1 tablet (400 mcg total) by mouth daily. 04/25/18  Yes Winfrey, Alcario Drought, MD  furosemide (LASIX) 40 MG tablet Take 1 tablet (40 mg total) by mouth daily. 04/25/18  Yes Winfrey, Alcario Drought, MD  gabapentin (NEURONTIN) 300 MG capsule Take 3 capsules EVERY MORNING, Take 2 capsules at lunch, and take 3 capsules at bedtime Patient taking differently: Take 600-900 mg by mouth See admin instructions. Take 3 capsules EVERY MORNING, Take 2 capsules at lunch, and take 3 capsules  at bedtime 04/25/18  Yes Winfrey, Alcario Drought, MD  Insulin Glargine (LANTUS SOLOSTAR) 100 UNIT/ML Solostar Pen Inject 50 Units into the skin daily at 10 pm. Patient taking differently: Inject 50 Units into the skin daily.  04/25/18  Yes Winfrey, Alcario Drought, MD  insulin lispro (HUMALOG KWIKPEN) 100 UNIT/ML KiwkPen Inject 0.05 mLs (5 Units total) into the skin 2 (two) times daily with a meal. 04/25/18  Yes Winfrey, Alcario Drought, MD  liver oil-zinc oxide (DESITIN) 40 % ointment Apply 1 application topically as needed for irritation. 07/12/18  Yes Shirley, Martinique, DO  Melatonin 5 MG CAPS Take 1 capsule (5 mg total) by mouth at bedtime. 04/25/18  Yes Winfrey, Alcario Drought, MD  mometasone (NASONEX) 50 MCG/ACT nasal spray Place 2 sprays into the nose daily as needed. 04/25/18  Yes Winfrey, Alcario Drought, MD  Multiple Vitamins-Minerals (MULTIVITAMIN) tablet Take 1 tablet by mouth daily. 04/25/18  Yes Winfrey, Alcario Drought, MD  olmesartan (BENICAR) 20 MG tablet Take 1 tablet (20 mg total) by mouth daily. 05/15/18  Yes Hensel, Jamal Collin, MD  omeprazole (PRILOSEC) 40 MG capsule Take 1 capsule (40 mg total) by mouth 2 (two) times daily. 05/03/18 07/18/18 Yes Beard, Ralph Leyden, DO  Polyvinyl Alcohol (LUBRICANT DROPS OP) Place 1 drop into both eyes daily as needed (dry eyes).   Yes [provider]  Probiotic Product (ALIGN) 4 MG CAPS Take 1 capsule (4 mg total) by mouth daily. 04/25/18  Yes Winfrey, Alcario Drought, MD  tiZANidine (ZANAFLEX) 4 MG tablet TAKE 1 TABLET BY MOUTH EVERY 6 HOURS AS NEEDED FOR MUSCLE SPASM 07/11/18  Yes Winfrey, Alcario Drought, MD  vitamin C (ASCORBIC ACID) 500 MG tablet Take 1 tablet (500 mg total) by mouth daily. 04/25/18  Yes Winfrey, Alcario Drought, MD  Witch Hazel (PREPARATION H) 50 % PADS Apply 1 application topically 2 (two) times daily as needed (itching). 04/25/18  Yes Winfrey, Alcario Drought, MD  B-D UF III MINI PEN NEEDLES 31G X 5 MM MISC  See admin instructions. Use with insulin injections 02/11/18   [provider]    glucose blood (ONETOUCH VERIO) test strip Check blood sugar 6 x daily 04/25/18   Kathrene Alu, MD  guaiFENesin (MUCINEX) 600 MG 12 hr tablet Take 2 tablets (1,200 mg total) 2 (two) times daily by mouth. Patient not taking: Reported on 07/18/2018 07/28/17   Langston Masker B, PA-C  Lancets [facility] ULTRASOFT) lancets Use as instructed 05/24/18   Nuala Alpha, DO    Family History Family History  Problem Relation Age of Onset  . Pancreatitis Mother   . Diabetes type II Mother   . Diabetes Mother   . CAD Father   . Heart attack Father   . Diabetes type II Sister   . Diabetes Sister   . Diabetes type II Brother   . Diabetes Brother   . Diabetes Brother   . Diabetes Brother   . Clotting disorder Son        heart attack x 3   . Breast cancer Maternal Aunt   . Colon cancer Neg Hx   . Stomach cancer Neg Hx     Social History Social History   Tobacco Use  . Smoking status: Former Smoker    Packs/day: 2.00    Years: 10.00    Pack years: 20.00    Types: Cigarettes    Last attempt to quit: 09/11/2005    Years since quitting: 12.8  . Smokeless tobacco: Never Used  Substance Use Topics  . Alcohol use: No  . Drug use: Not Currently    Types: Cocaine    Comment: History Cocaine - last use in 2000's     Allergies   Lactose intolerance (gi); Lisinopril; Reglan [metoclopramide]; Wellbutrin [bupropion]; Other; Latex; Metformin and related; Penicillins; Sulfa antibiotics; and Tape   Review of Systems Review of Systems All other systems negative except as documented in the HPI. All pertinent positives and negatives as reviewed in the HPI.  Physical Exam Updated Vital Signs BP 95/64   Pulse 70   Resp 18   Ht 5\' 4"  (1.626 m)   Wt 105.7 kg   LMP  (LMP Unknown)   SpO2 96%   BMI 39.99 kg/m   Physical Exam  Constitutional: She is oriented to person, place, and time. She appears well-developed and well-nourished. No distress.  HENT:  Head: Normocephalic and atraumatic.   Mouth/Throat: Oropharynx is clear and moist.  Eyes: Pupils are equal, round, and reactive to light.  Neck: Normal range of motion. Neck supple.  Cardiovascular: Normal rate, regular rhythm and normal heart sounds. Exam reveals no gallop and no friction rub.  No murmur heard. Pulmonary/Chest: Effort normal and breath sounds normal. No respiratory distress. She has no wheezes.  Abdominal: Soft. Bowel sounds are normal. She exhibits no distension. There is no tenderness.  Musculoskeletal: She exhibits no edema or deformity.  Neurological: She is alert and oriented to person, place, and time. She exhibits normal muscle tone. Coordination normal.  Skin: Skin is warm and dry. Capillary refill takes less than 2 seconds. No rash noted. No erythema.  Psychiatric: She has a normal mood and affect. Her behavior is normal.  Nursing note and vitals reviewed.    ED Treatments / Results  Labs (all labs ordered are listed, but only abnormal results are displayed) Labs Reviewed  BASIC METABOLIC PANEL - Abnormal; Notable for the following components:      Result Value   CO2 19 (*)    Glucose,  Bld 118 (*)    BUN 51 (*)    Creatinine, Ser 2.11 (*)    Calcium 7.9 (*)    GFR calc non Af Amer 25 (*)    GFR calc Af Amer 29 (*)    All other components within normal limits  CBC - Abnormal; Notable for the following components:   Hemoglobin 11.2 (*)    MCHC 29.2 (*)    All other components within normal limits  CBG MONITORING, ED - Abnormal; Notable for the following components:   Glucose-Capillary 108 (*)    All other components within normal limits  CBG MONITORING, ED - Abnormal; Notable for the following components:   Glucose-Capillary 174 (*)    All other components within normal limits  URINALYSIS, ROUTINE W REFLEX MICROSCOPIC  I-STAT BETA HCG BLOOD, ED ([facility], [facility], [facility] ONLY)    EKG None  Radiology No results found.  Procedures Procedures (including critical care time)  Medications Ordered  in ED Medications  sodium chloride 0.9 % bolus 1,000 mL (has no administration in time range)  ondansetron (ZOFRAN) injection 4 mg (4 mg Intravenous Given 07/18/18 2300)     Initial Impression / Assessment and Plan / ED Course  I have reviewed the triage vital signs and the nursing notes.  Pertinent labs & imaging results that were available during my care of the patient were reviewed by me and considered in my medical decision making (see chart for details).     Patient states she could like to use out back.  Patient has received some fluid but will give a fluid bolus.  I feel that she is somewhat dehydrated based on the fact that she has been having more diarrhea than normal.  Patient will need further assessment once this is complete.  She may need admission to the hospital for dehydration and hypotension. Final Clinical Impressions(s) / ED Diagnoses   Final diagnoses:  None    ED Discharge Orders    None       Dalia Heading, PA-C 07/19/18 0009    Duffy Bruce, MD 07/19/18 [phone]

## 2018-07-19 NOTE — Discharge Instructions (Signed)
Follow-up with your PCP.

## 2018-07-19 NOTE — ED Provider Notes (Signed)
57 yo F with a cc of syncopal event.  Had a event where she lost consciousness after she took her insulin but did not eat.  Patient is having some low blood pressures here in the emergency department.  I received the patient in signout from [employer], PA-C.  Plan is to reassess post IV fluids.  Patient continues to be borderline hypotensive likely needs to be admitted for continued hydration.  Patient continues to feel well.  She is able to ambulate to the bathroom without difficulty.  Her blood pressure has come up with IV fluids.  Will discharge home.   Deno Etienne, DO 07/19/18 [phone]

## 2018-07-20 ENCOUNTER — Other Ambulatory Visit: Payer: Self-pay | Admitting: Family Medicine

## 2018-07-23 ENCOUNTER — Ambulatory Visit (INDEPENDENT_AMBULATORY_CARE_PROVIDER_SITE_OTHER): Payer: Medicare Other | Admitting: Podiatry

## 2018-07-23 ENCOUNTER — Other Ambulatory Visit: Payer: Self-pay | Admitting: *Deleted

## 2018-07-23 DIAGNOSIS — M79675 Pain in left toe(s): Secondary | ICD-10-CM

## 2018-07-23 DIAGNOSIS — B351 Tinea unguium: Secondary | ICD-10-CM

## 2018-07-23 DIAGNOSIS — M79674 Pain in right toe(s): Secondary | ICD-10-CM

## 2018-07-23 NOTE — Patient Instructions (Signed)
Diabetes and Foot Care Diabetes may cause you to have problems because of poor blood supply (circulation) to your feet and legs. This may cause the skin on your feet to become thinner, break easier, and heal more slowly. Your skin may become dry, and the skin may peel and crack. You may also have nerve damage in your legs and feet causing decreased feeling in them. You may not notice minor injuries to your feet that could lead to infections or more serious problems. Taking care of your feet is one of the most important things you can do for yourself. Follow these instructions at home:  Wear shoes at all times, even in the house. Do not go barefoot. Bare feet are easily injured.  Check your feet daily for blisters, cuts, and redness. If you cannot see the bottom of your feet, use a mirror or ask someone for help.  Wash your feet with warm water (do not use hot water) and mild soap. Then pat your feet and the areas between your toes until they are completely dry. Do not soak your feet as this can dry your skin.  Apply a moisturizing lotion or petroleum jelly (that does not contain alcohol and is unscented) to the skin on your feet and to dry, brittle toenails. Do not apply lotion between your toes.  Trim your toenails straight across. Do not dig under them or around the cuticle. File the edges of your nails with an emery board or nail file.  Do not cut corns or calluses or try to remove them with medicine.  Wear clean socks or stockings every day. Make sure they are not too tight. Do not wear knee-high stockings since they may decrease blood flow to your legs.  Wear shoes that fit properly and have enough cushioning. To break in new shoes, wear them for just a few hours a day. This prevents you from injuring your feet. Always look in your shoes before you put them on to be sure there are no objects inside.  Do not cross your legs. This may decrease the blood flow to your feet.  If you find a  minor scrape, cut, or break in the skin on your feet, keep it and the skin around it clean and dry. These areas may be cleansed with mild soap and water. Do not cleanse the area with peroxide, alcohol, or iodine.  When you remove an adhesive bandage, be sure not to damage the skin around it.  If you have a wound, look at it several times a day to make sure it is healing.  Do not use heating pads or hot water bottles. They may burn your skin. If you have lost feeling in your feet or legs, you may not know it is happening until it is too late.  Make sure your health care provider performs a complete foot exam at least annually or more often if you have foot problems. Report any cuts, sores, or bruises to your health care provider immediately. Contact a health care provider if:  You have an injury that is not healing.  You have cuts or breaks in the skin.  You have an ingrown nail.  You notice redness on your legs or feet.  You feel burning or tingling in your legs or feet.  You have pain or cramps in your legs and feet.  Your legs or feet are numb.  Your feet always feel cold. Get help right away if:  There is increasing   redness, swelling, or pain in or around a wound.  There is a red line that goes up your leg.  Pus is coming from a wound.  You develop a fever or as directed by your health care provider.  You notice a bad smell coming from an ulcer or wound. This information is not intended to replace advice given to you by your health care provider. Make sure you discuss any questions you have with your health care provider. Document Released: 08/25/2000 Document Revised: 02/03/2016 Document Reviewed: 02/04/2013 Elsevier Interactive Patient Education  2017 [employer]  Diabetic Neuropathy Diabetic neuropathy is a nerve disease or nerve damage that is caused by diabetes mellitus. About half of all people with diabetes mellitus have some form of nerve damage. Nerve damage  is more common in those who have had diabetes mellitus for many years and who generally have not had good control of their blood sugar (glucose) level. Diabetic neuropathy is a common complication of diabetes mellitus. There are three common types of diabetic neuropathy and a fourth type that is less common and less understood:  Peripheral neuropathy-This is the most common type of diabetic neuropathy. It causes damage to the nerves of the feet and legs first and then eventually the hands and arms. The damage affects the ability to sense touch.  Autonomic neuropathy-This type causes damage to the autonomic nervous system, which controls the following functions: ? Heartbeat. ? Body temperature. ? Blood pressure. ? Urination. ? Digestion. ? Sweating. ? Sexual function.  Focal neuropathy-Focal neuropathy can be painful and unpredictable and occurs most often in older adults with diabetes mellitus. It involves a specific nerve or one area and often comes on suddenly. It usually does not cause long-term problems.  Radiculoplexus neuropathy- Sometimes called lumbosacral radiculoplexus neuropathy, radiculoplexus neuropathy affects the nerves of the thighs, hips, buttocks, or legs. It is more common in people with type 2 diabetes mellitus and in older men. It is characterized by debilitating pain, weakness, and atrophy, usually in the thigh muscles.  What are the causes? The cause of peripheral, autonomic, and focal neuropathies is diabetes mellitus that is uncontrolled and high glucose levels. The cause of radiculoplexus neuropathy is unknown. However, it is thought to be caused by inflammation related to uncontrolled glucose levels. What are the signs or symptoms? Peripheral Neuropathy Peripheral neuropathy develops slowly over time. When the nerves of the feet and legs no longer work there may be:  Burning, stabbing, or aching pain in the legs or feet.  Inability to feel pressure or pain in your  feet. This can lead to: ? Thick calluses over pressure areas. ? Pressure sores. ? Ulcers.  Foot deformities.  Reduced ability to feel temperature changes.  Muscle weakness.  Autonomic Neuropathy The symptoms of autonomic neuropathy vary depending on which nerves are affected. Symptoms may include:  Problems with digestion, such as: ? Feeling sick to your stomach (nausea). ? Vomiting. ? Bloating. ? Constipation. ? Diarrhea. ? Abdominal pain.  Difficulty with urination. This occurs if you lose your ability to sense when your bladder is full. Problems include: ? Urine leakage (incontinence). ? Inability to empty your bladder completely (retention).  Rapid or irregular heartbeat (palpitations).  Blood pressure drops when you stand up (orthostatic hypotension). When you stand up you may feel: ? Dizzy. ? Weak. ? Faint.  In men, inability to attain and maintain an erection.  In women, vaginal dryness and problems with decreased sexual desire and arousal.  Problems with body temperature   regulation.  Increased or decreased sweating.  Focal Neuropathy  Abnormal eye movements or abnormal alignment of both eyes.  Weakness in the wrist.  Foot drop. This results in an inability to lift the foot properly and abnormal walking or foot movement.  Paralysis on one side of your face (Bell palsy).  Chest or abdominal pain. Radiculoplexus Neuropathy  Sudden, severe pain in your hip, thigh, or buttocks.  Weakness and wasting of thigh muscles.  Difficulty rising from a seated position.  Abdominal swelling.  Unexplained weight loss (usually more than 10 lb [4.5 kg]). How is this diagnosed? Peripheral Neuropathy Your senses may be tested. Sensory function testing can be done with:  A light touch using a monofilament.  A vibration with tuning fork.  A sharp sensation with a pin prick.  Other tests that can help diagnose neuropathy are:  Nerve conduction velocity. This  test checks the transmission of an electrical current through a nerve.  Electromyography. This shows how muscles respond to electrical signals transmitted by nearby nerves.  Quantitative sensory testing. This is used to assess how your nerves respond to vibrations and changes in temperature.  Autonomic Neuropathy Diagnosis is often based on reported symptoms. Tell your health care provider if you experience:  Dizziness.  Constipation.  Diarrhea.  Inappropriate urination or inability to urinate.  Inability to get or maintain an erection.  Tests that may be done include:  Electrocardiography or Holter monitor. These are tests that can help show problems with the heart rate or heart rhythm.  An X-ray exam may be done.  Focal Neuropathy Diagnosis is made based on your symptoms and what your health care provider finds during your exam. Other tests may be done. They may include:  Nerve conduction velocities. This checks the transmission of electrical current through a nerve.  Electromyography. This shows how muscles respond to electrical signals transmitted by nearby nerves.  Quantitative sensory testing. This test is used to assess how your nerves respond to vibration and changes in temperature.  Radiculoplexus Neuropathy  Often the first thing is to eliminate any other issue or problems that might be the cause, as there is no standard test for diagnosis.  X-ray exam of your spine and lumbar region.  Spinal tap to rule out cancer.  MRI to rule out other lesions. How is this treated? Once nerve damage occurs, it cannot be reversed. The goal of treatment is to keep the disease or nerve damage from getting worse and affecting more nerve fibers. Controlling your blood glucose level is the key. Most people with radiculoplexus neuropathy see at least a partial improvement over time. You will need to keep your blood glucose and HbA1c levels in the target range determined by your  health care provider. Things that help control blood glucose levels include:  Blood glucose monitoring.  Meal planning.  Physical activity.  Diabetes medicine.  Over time, maintaining lower blood glucose levels helps lessen symptoms. Sometimes, prescription pain medicine is needed. Follow these instructions at home:  Do not smoke.  Keep your blood glucose level in the range that you and your health care provider have determined acceptable for you.  Keep your blood pressure level in the range that you and your health care provider have determined acceptable for you.  Eat a well-balanced diet.  Be physically active every day. Include strength training and balance exercises.  Protect your feet. ? Check your feet every day for sores, cuts, blisters, or signs of infection. ? Wear padded socks   and supportive shoes. Use orthotic inserts, if necessary. ? Regularly check the insides of your shoes for worn spots. Make sure there are no rocks or other items inside your shoes before you put them on. Contact a health care provider if:  You have burning, stabbing, or aching pain in the legs or feet.  You are unable to feel pressure or pain in your feet.  You develop problems with digestion such as: ? Nausea. ? Vomiting. ? Bloating. ? Constipation. ? Diarrhea. ? Abdominal pain.  You have difficulty with urination, such as: ? Incontinence. ? Retention.  You have palpitations.  You develop orthostatic hypotension. When you stand up you may feel: ? Dizzy. ? Weak. ? Faint.  You cannot attain and maintain an erection (in men).  You have vaginal dryness and problems with decreased sexual desire and arousal (in women).  You have severe pain in your thighs, legs, or buttocks.  You have unexplained weight loss. This information is not intended to replace advice given to you by your health care provider. Make sure you discuss any questions you have with your health care  provider. Document Released: 11/06/2001 Document Revised: 02/03/2016 Document Reviewed: 02/06/2013 Elsevier Interactive Patient Education  2017 [employer]  

## 2018-07-23 NOTE — Telephone Encounter (Signed)
I've filled out her [employer] forms and placed them in the nurse folder.  Thanks!

## 2018-07-23 NOTE — Patient Outreach (Signed)
[facility]) Care Management  07/23/2018  Mahari Vankirk 1961/09/08 [id number]   RN Health Coach Monthly Outreach  Referral Date:04/17/2018 Referral Source:EMMI Prevent Screening Reason for Referral:Disease Management Education, Medication compliance packaging Insurance:[employer]   Outreach Attempt:  Outreach attempt #3 to patient for monthly follow up. Telephone went straight to voicemail. RN Health Coach left HIPAA compliant voicemail message along with contact information.  Plan:  RN Health Coach will send unsuccessful outreach letter to patient.  RN Health Coach will make final outreach attempt to patient within the month of December if no return call back from patient.  [place] [phone] [email or web address]

## 2018-07-24 NOTE — Telephone Encounter (Signed)
Forms faxed to [employer] at [phone]. Originals in envelope at front desk for patient, copy made for batch scanning. Left message for patient to inform.  Danley Danker, RN [facility] RN)

## 2018-07-26 ENCOUNTER — Encounter: Payer: Self-pay | Admitting: Physical Therapy

## 2018-07-26 ENCOUNTER — Ambulatory Visit: Payer: Medicare Other | Admitting: Physical Therapy

## 2018-07-26 VITALS — BP 142/80 | HR 77

## 2018-07-26 DIAGNOSIS — M545 Low back pain, unspecified: Secondary | ICD-10-CM

## 2018-07-26 DIAGNOSIS — M62830 Muscle spasm of back: Secondary | ICD-10-CM

## 2018-07-26 DIAGNOSIS — M25572 Pain in left ankle and joints of left foot: Secondary | ICD-10-CM | POA: Diagnosis not present

## 2018-07-26 DIAGNOSIS — G8929 Other chronic pain: Secondary | ICD-10-CM | POA: Diagnosis not present

## 2018-07-26 DIAGNOSIS — R293 Abnormal posture: Secondary | ICD-10-CM | POA: Diagnosis not present

## 2018-07-26 NOTE — Therapy (Signed)
[place], [place], [address] Phone: [phone]   Fax:  [phone]  Physical Therapy Treatment Progress Note Reporting Period 07/26/2018 to 08/23/2018  See note below for Objective Data and Assessment of Progress/Goals.       Patient Details  Name: Tamara Henry MRN: [id number] Date of Birth: 1961-02-16 Referring Provider (PT): Winfrey, A., Gambino, C.   Encounter Date: 07/26/2018  PT End of Session - 07/26/18 0905    Visit Number  9    Number of Visits  17    Date for PT Re-Evaluation  08/23/18    Authorization Type  MCR MCD, KX at 15 visits    PT Start Time  0901    PT Stop Time  0958    PT Time Calculation (min)  57 min    Activity Tolerance  Patient tolerated treatment well    Behavior During Therapy  [facility] for tasks assessed/performed       Past Medical History:  Diagnosis Date  . Acid reflux   . Allergy    seasonal  . Anxiety   . Arthritis    lower back, feet  . Asthma    albuterol inhaler - rarely uses - only needs when she gets sick  . Back pain   . Bilateral pulmonary embolism ([place]) 05/2016  . Bipolar disorder ([place])   . CAD in native artery    S/P NSTEMI with cath showing severe 3 vessel ASCAD s/p CABGx4 07/28/16.  . Cataracts, bilateral   . CKD (chronic kidney disease), stage III (HCC)    borderline CKD II-III  . Cocaine abuse ([place])    In remission. Stopped using in early 2000's  . Depression   . Diabetes mellitus ([place])    Type II  . GERD (gastroesophageal reflux disease)   . Glaucoma   . High cholesterol   . History of blood transfusion 2017  . History of pulmonary embolus (PE)   . 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 Hx of chest tube placement right   . Hypertension   . Morbid obesity ([place])   . Myocardial infarction ([place]) 2017  . Neuromuscular disorder (HCC)    neuropathy in hands and feet  . Neuropathy    hands and feet  . Pneumonia   . Postoperative anemia 07/2016  .  PTSD (post-traumatic stress disorder)   . Sleep apnea    Does not use cpap  . Stroke [facility]) 2005   no residual  . SVD (spontaneous vaginal delivery)    x 4    Past Surgical History:  Procedure Laterality Date  . CARDIAC CATHETERIZATION N/A 07/25/2016   Procedure: Left Heart Cath and Coronary Angiography;  Surgeon: Peter M Martinique, MD;  Location: [place] [facility];  Service: Cardiovascular;  Laterality: N/A;  . COLONOSCOPY    . COLONOSCOPY W/ POLYPECTOMY    . CORONARY ARTERY BYPASS GRAFT N/A 07/28/2016   Procedure: CORONARY ARTERY BYPASS GRAFTING (CABG)x4 using left internal mammary and endoscopic harvest of right greater saphenous vein;  Surgeon: Ivin Poot, MD;  Location: [place];  Service: Open Heart Surgery;  Laterality: N/A;  . DENTAL SURGERY     to removed broken teeth  . HYSTEROSCOPY W/D&C N/A 01/31/2018   Procedure: DILATATION AND CURETTAGE /HYSTEROSCOPY;  Surgeon: Woodroe Mode, MD;  Location: [place] [facility];  Service: Gynecology;  Laterality: N/A;  . POLYPECTOMY    . PULMONARY EMBOLISM SURGERY  2017   [facility]  . SHOULDER ARTHROSCOPY WITH  ROTATOR CUFF REPAIR Left 02/19/2017   Procedure: LEFT SHOULDER ARTHROSCOPY with debridement andROTATOR CUFF REPAIR;  Surgeon: [name], MD;  Location: [place];  Service: Orthopedics;  Laterality: Left;  . TEE WITHOUT CARDIOVERSION N/A 07/28/2016   Procedure: TRANSESOPHAGEAL ECHOCARDIOGRAM (TEE);  Surgeon: Ivin Poot, MD;  Location: [place];  Service: Open Heart Surgery;  Laterality: N/A;  . TONSILLECTOMY    . TUBAL LIGATION      Vitals:   07/26/18 0928  BP: (!) 142/80  Pulse: 77  SpO2: 97%    Subjective Assessment - 07/26/18 0928    Subjective  Primary complaint today is right-sided LBP. Still having some intermittent left ankle pain but no ankle symptoms today. Of note pt. went to ED 07/18/18 due to an episode of syncope that was medication related. No hospital admission required and pt. reports symptoms resolved.    Limitations   Standing;Walking    How long can you sit comfortably?  30 minutes    How long can you stand comfortably?  3-4 minutes limited from back pain    How long can you walk comfortably?  3-4 minutes    Diagnostic tests  Lt ankle x-ray shows no acute fracture or dislocation but does show calcified PVD.    Patient Stated Goals  walk better and feel better    Currently in Pain?  Yes    Pain Score  6     Pain Location  Back    Pain Orientation  Right;Lower    Pain Descriptors / Indicators  Tightness    Pain Type  Chronic pain    Pain Onset  More than a month ago    Pain Frequency  Intermittent    Aggravating Factors   standing and walking    Pain Relieving Factors  sitting, rest    Effect of Pain on Daily Activities  decreased tolerance standing and ambulation    Multiple Pain Sites  No         [facility] PT Assessment - 07/26/18 0001      Observation/Other Assessments   Focus on Therapeutic Outcomes (FOTO)   47/53% limited      AROM   Right/Left Ankle  Left    Left Ankle Dorsiflexion  2    Left Ankle Plantar Flexion  58    Left Ankle Inversion  40    Left Ankle Eversion  30    Lumbar Flexion  80    Lumbar Extension  25    Lumbar - Right Side Bend  30    Lumbar - Left Side Bend  20    Lumbar - Right Rotation  75%    Lumbar - Left Rotation  75%      Strength   Overall Strength Comments  Left ankle EV 4+/5 otherwise left ankle 5/5   LE otherwise WNL     Flexibility   Hamstrings  70 deg bilat.                   [place] Adult PT Treatment/Exercise - 07/26/18 0001      Exercises   Exercises  Lumbar;Knee/Hip      Lumbar Exercises: Stretches   Lower Trunk Rotation  --   Left LTR for right lumbar stretch 5-10 sec x 10 reps   Other Lumbar Stretch Exercise  Right manual QL stretch in left sidelying 30 sec x 3      Lumbar Exercises: Supine   Pelvic Tilt  20 reps  Bent Knee Raise  15 reps    Other Supine Lumbar Exercises  DKTC with 65 cm ball 2x10      Knee/Hip  Exercises: Stretches   Passive Hamstring Stretch  Both;3 reps;30 seconds    Piriformis Stretch  Right;3 reps;30 seconds      Moist Heat Therapy   Number Minutes Moist Heat  10 Minutes    Moist Heat Location  Lumbar Spine      Manual Therapy   Manual Therapy  Soft tissue mobilization;Myofascial release    Soft tissue mobilization  STM right lumbar paraspinals and QL in left sidelying    Myofascial Release  Right lumbar paraspinals and QL in left sidelying             PT Education - 07/26/18 0928    Education Details  POC    Person(s) Educated  Patient    Methods  Explanation    Comprehension  Verbalized understanding       PT Short Term Goals - 07/26/18 0938      PT SHORT TERM GOAL #1   Title  --    Baseline  --    Time  --    Period  --    Status  --      PT SHORT TERM GOAL #2   Title  --    Time  --    Period  --    Status  --        PT Long Term Goals - 07/26/18 2353      PT LONG TERM GOAL #1   Title  She will be independent with all HEP issued    Baseline  updates ongoing    Time  4    Period  Weeks    Status  On-going    Target Date  08/23/18      PT LONG TERM GOAL #2   Title  She will report episodes of LBP and LT ankle to dec to once over 2 weeks     Baseline  improving but not met    Time  4    Period  Weeks    Status  On-going    Target Date  08/23/18      PT LONG TERM GOAL #3   Title  Perform ADLs/IADLs such as chores at home, waiting at bus stop, brief trips to grocery store with back and ankle pain 3/10 or less    Baseline  back pain 6/10, ankle pain variable    Time  4    Period  Weeks    Status  On-going      PT LONG TERM GOAL #4   Title  Pt. to ambulate for short trips to grocery store at least 20 min without needing to use motorized cart.    Baseline  intermittently uses cart due to pain    Time  4    Period  Weeks    Status  On-going      PT LONG TERM GOAL #5   Title  Left ankle strength grossly 5/5 to improve stability  for walking outdoors over uneven surfaces    Baseline  EV 4+/5    Time  4    Period  Weeks    Status  On-going    Target Date  08/23/18            Plan - 07/26/18 0954    Clinical Impression Statement  Fair progress with therapy impacted by limited  attendance with today as 3rd visit since last progress note-pt. has had some transportation and other medical issues that have limited ability to attend. Ankle in particular has improved though still with intermittent symptoms. Standing and walking tolerance still limited by LBP. Pt. has made functional improvements as evidenced by 12% gain in FOTO outcome measure score since last report. Pt. would benefit from continued PT for further progress to address remaining functional limitations.    History and Personal Factors relevant to plan of care:  chronic LBP history, medical comorbidities, attendance issues    Clinical Presentation  Stable    Rehab Potential  Fair    Clinical Impairments Affecting Rehab Potential  chronic LBP history, comorbidities    PT Frequency  2x / week    PT Duration  4 weeks    PT Treatment/Interventions  Manual techniques;Taping;Dry needling;Patient/family education;Therapeutic exercise;Therapeutic activities;Moist Heat;Neuromuscular re-education;Cryotherapy;Ultrasound;Iontophoresis 33m/ml Dexamethasone;Gait training;Functional mobility training;ADLs/Self Care Home Management    PT Next Visit Plan  Tx. focus left foot/ankle vs. back pending area of most pain, continue gastroc and peroneal stretching, STM left gastroc and foot, ankle strengthening, foot instrinsic ROM and strengthening, for back continue ROM, stretching, core strengthening and STM    PT Home Exercise Plan  PPT, quadratus stretch, tennis ball STW, ankle isometrics and theraband 4-way, calf stretch,  hip flexor stretch, standing gastroc stretch     Consulted and Agree with Plan of Care  Patient       Patient will benefit from skilled therapeutic  intervention in order to improve the following deficits and impairments:  Pain, Increased muscle spasms, Postural dysfunction, Decreased range of motion, Decreased strength, Abnormal gait, Decreased activity tolerance, Difficulty walking, Obesity  Visit Diagnosis: Pain in left ankle and joints of left foot  Chronic right-sided low back pain without sciatica  Muscle spasm of back  Abnormal posture     Problem List Patient Active Problem List   Diagnosis Date Noted  . Mass 05/20/2018  . Swelling 05/20/2018  . Trigger ring finger of left hand 05/20/2018  . Tendonitis of ankle, left 05/14/2018  . Abnormal LFTs (liver function tests)   . Abdominal pain, RUQ   . Acute renal failure superimposed on chronic kidney disease ([place]   . Epigastric pain   . Transaminitis 04/30/2018  . Left hand pain 04/18/2018  . Sprain of ankle 03/25/2018  . Pain in lower jaw 01/16/2018  . Healthcare maintenance 12/11/2017  . Vaginal bleeding 10/09/2017  . Postmenopausal vaginal bleeding 10/04/2017  . Cough 09/28/2017  . Chronic pansinusitis 09/28/2017  . Laryngopharyngeal reflux (LPR) 08/28/2017  . Sensorineural hearing loss (SNHL), bilateral 08/28/2017  . Diarrhea in adult patient 08/04/2017  . Arm pain, anterior, right 06/17/2017  . OSA on CPAP 05/03/2017  . Morbid obesity due to excess calories ([place] 05/03/2017  . Microscopic hematuria 03/27/2017  . Impingement syndrome of left shoulder 02/19/2017  . Hypersomnia 12/12/2016  . REM behavioral disorder 12/12/2016  . Asthma 12/12/2016  . Neck pain 11/17/2016  . CKD (chronic kidney disease), stage III ([place] 11/02/2016  . CAD (coronary artery disease), native coronary artery 10/26/2016  . Poor social situation 10/13/2016  . Leg pain 08/30/2016  . Back pain 08/30/2016  . 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 GERD (gastroesophageal reflux disease) 06/09/2016  . Obesity (BMI 30-39.9) 06/09/2016  . Facial swelling 05/21/2016  . Hyperlipidemia 05/17/2016  .  Depression 05/17/2016  . Diabetic peripheral neuropathy ([place] 05/17/2016  . Neuritis of upper extremity, C7 01/15/2015  . Hypertension  01/14/2015  . Numbness and tingling of right arm 01/14/2015  . Proliferative diabetic retinopathy ([place]) 02/24/2014  . Diabetes mellitus ([place]) 12/18/2013  . Nuclear cataract of both eyes 12/18/2013    Beaulah Dinning, PT, DPT 07/26/18 10:04 AM  [place] [address] [place], [place], [address] Phone: [phone]   Fax:  [phone]  Name: Tamara Henry MRN: [id number] Date of Birth: 05-Jan-1961

## 2018-07-29 ENCOUNTER — Encounter: Payer: Self-pay | Admitting: Physical Therapy

## 2018-07-29 ENCOUNTER — Ambulatory Visit: Payer: Medicare Other | Admitting: Physical Therapy

## 2018-07-29 ENCOUNTER — Ambulatory Visit: Payer: Medicare Other | Admitting: Family Medicine

## 2018-07-29 DIAGNOSIS — M62830 Muscle spasm of back: Secondary | ICD-10-CM

## 2018-07-29 DIAGNOSIS — G8929 Other chronic pain: Secondary | ICD-10-CM | POA: Diagnosis not present

## 2018-07-29 DIAGNOSIS — R293 Abnormal posture: Secondary | ICD-10-CM

## 2018-07-29 DIAGNOSIS — M545 Low back pain, unspecified: Secondary | ICD-10-CM

## 2018-07-29 DIAGNOSIS — M25572 Pain in left ankle and joints of left foot: Secondary | ICD-10-CM | POA: Diagnosis not present

## 2018-07-29 NOTE — Therapy (Signed)
[place], [place], [address] Phone: [phone]   Fax:  [phone]  Physical Therapy Treatment  Patient Details  Name: Tamara Henry MRN: [id number] Date of Birth: [birth year] Referring Provider (PT): Winfrey, A., Gambino, C.   Encounter Date: 07/29/2018  PT End of Session - 07/29/18 1449    Visit Number  10    Number of Visits  17    Date for PT Re-Evaluation  08/23/18    Authorization Type  MCR MCD, KX at 15 visits    PT Start Time  1410    PT Stop Time  1502    PT Time Calculation (min)  52 min    Activity Tolerance  Patient tolerated treatment well    Behavior During Therapy  [facility] for tasks assessed/performed       Past Medical History:  Diagnosis Date  . Acid reflux   . Allergy    seasonal  . Anxiety   . Arthritis    lower back, feet  . Asthma    albuterol inhaler - rarely uses - only needs when she gets sick  . Back pain   . Bilateral pulmonary embolism ([place]) 05/2016  . Bipolar disorder ([place])   . CAD in native artery    S/P NSTEMI with cath showing severe 3 vessel ASCAD s/p CABGx4 07/28/16.  . Cataracts, bilateral   . CKD (chronic kidney disease), stage III (HCC)    borderline CKD II-III  . Cocaine abuse ([place])    In remission. Stopped using in early 2000's  . Depression   . Diabetes mellitus ([place])    Type II  . GERD (gastroesophageal reflux disease)   . Glaucoma   . High cholesterol   . History of blood transfusion 2017  . History of pulmonary embolus (PE)   . 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 Hx of chest tube placement right   . Hypertension   . Morbid obesity ([place])   . Myocardial infarction ([place]) 2017  . Neuromuscular disorder (HCC)    neuropathy in hands and feet  . Neuropathy    hands and feet  . Pneumonia   . Postoperative anemia 07/2016  . PTSD (post-traumatic stress disorder)   . Sleep apnea    Does not use cpap  . Stroke [facility]) 2005   no residual  . SVD (spontaneous  vaginal delivery)    x 4    Past Surgical History:  Procedure Laterality Date  . CARDIAC CATHETERIZATION N/A 07/25/2016   Procedure: Left Heart Cath and Coronary Angiography;  Surgeon: Peter M Martinique, MD;  Location: [place] [facility];  Service: Cardiovascular;  Laterality: N/A;  . COLONOSCOPY    . COLONOSCOPY W/ POLYPECTOMY    . CORONARY ARTERY BYPASS GRAFT N/A 07/28/2016   Procedure: CORONARY ARTERY BYPASS GRAFTING (CABG)x4 using left internal mammary and endoscopic harvest of right greater saphenous vein;  Surgeon: Ivin Poot, MD;  Location: [place];  Service: Open Heart Surgery;  Laterality: N/A;  . DENTAL SURGERY     to removed broken teeth  . HYSTEROSCOPY W/D&C N/A 01/31/2018   Procedure: DILATATION AND CURETTAGE /HYSTEROSCOPY;  Surgeon: Woodroe Mode, MD;  Location: [place] [facility];  Service: Gynecology;  Laterality: N/A;  . POLYPECTOMY    . PULMONARY EMBOLISM SURGERY  2017   [facility]  . SHOULDER ARTHROSCOPY WITH ROTATOR CUFF REPAIR Left 02/19/2017   Procedure: LEFT SHOULDER ARTHROSCOPY with debridement andROTATOR CUFF REPAIR;  Surgeon: [name], MD;  Location: [place];  Service: Orthopedics;  Laterality: Left;  . TEE WITHOUT CARDIOVERSION N/A 07/28/2016   Procedure: TRANSESOPHAGEAL ECHOCARDIOGRAM (TEE);  Surgeon: Ivin Poot, MD;  Location: [place];  Service: Open Heart Surgery;  Laterality: N/A;  . TONSILLECTOMY    . TUBAL LIGATION      There were no vitals filed for this visit.  Subjective Assessment - 07/29/18 1438    Subjective  Pt. reports last session was helpful for LBP but symptoms exacerbated doing chores over the weekend.    Currently in Pain?  Yes    Pain Score  8     Pain Location  Back    Pain Orientation  Right;Lower    Pain Descriptors / Indicators  Sharp    Pain Type  Chronic pain    Pain Onset  More than a month ago    Aggravating Factors   standing and walking    Pain Relieving Factors  sitting, rest    Effect of Pain on Daily Activities  decreased  tolerance standing and ambulation    Multiple Pain Sites  No                       [facility] Adult PT Treatment/Exercise - 07/29/18 0001      Exercises   Exercises  Lumbar;Knee/Hip      Lumbar Exercises: Stretches   Passive Hamstring Stretch  Right;Left;3 reps;30 seconds    Double Knee to Chest Stretch  --   2x10 with 65 cm ball   Lower Trunk Rotation Limitations  x 10 body weight and x 10 with 65 cm ball to left for right lumbar stretch    Piriformis Stretch  Right;3 reps;20 seconds      Lumbar Exercises: Seated   Other Seated Lumbar Exercises  Row 2x10 with Green Theraband      Lumbar Exercises: Supine   Clam  15 reps    Clam Limitations  Green Theraband    Bent Knee Raise  15 reps    Bridge  15 reps    Isometric Hip Flexion  10 reps   bilat.   Other Supine Lumbar Exercises  hip add. isometrics with ball x 15      Moist Heat Therapy   Number Minutes Moist Heat  10 Minutes    Moist Heat Location  Lumbar Spine      Manual Therapy   Manual Therapy  Soft tissue mobilization;Myofascial release    Soft tissue mobilization  STM and MFR right lumbar paraspinals in QL in left sidelying             PT Education - 07/29/18 1448    Education Details  POC, HEP    Person(s) Educated  Patient    Methods  Explanation    Comprehension  Verbalized understanding       PT Short Term Goals - 07/26/18 0938      PT SHORT TERM GOAL #1   Title  --    Baseline  --    Time  --    Period  --    Status  --      PT SHORT TERM GOAL #2   Title  --    Time  --    Period  --    Status  --        PT Long Term Goals - 07/26/18 7564      PT LONG TERM GOAL #1   Title  She  will be independent with all HEP issued    Baseline  updates ongoing    Time  4    Period  Weeks    Status  On-going    Target Date  08/23/18      PT LONG TERM GOAL #2   Title  She will report episodes of LBP and LT ankle to dec to once over 2 weeks     Baseline  improving but not met     Time  4    Period  Weeks    Status  On-going    Target Date  08/23/18      PT LONG TERM GOAL #3   Title  Perform ADLs/IADLs such as chores at home, waiting at bus stop, brief trips to grocery store with back and ankle pain 3/10 or less    Baseline  back pain 6/10, ankle pain variable    Time  4    Period  Weeks    Status  On-going      PT LONG TERM GOAL #4   Title  Pt. to ambulate for short trips to grocery store at least 20 min without needing to use motorized cart.    Baseline  intermittently uses cart due to pain    Time  4    Period  Weeks    Status  On-going      PT LONG TERM GOAL #5   Title  Left ankle strength grossly 5/5 to improve stability for walking outdoors over uneven surfaces    Baseline  EV 4+/5    Time  4    Period  Weeks    Status  On-going    Target Date  08/23/18            Plan - 07/29/18 1452    Clinical Impression Statement  Responds well to tx. but some setback over the weekend as noted in subjective. Still no radicular symptoms and primary pain right upper lumbar region-suspect combination of myofascial etiology with underlying degenerative changes. Pt. would benefit from continued PT for further ongoing progress to address activity tolerance limitations.    PT Frequency  2x / week    PT Duration  4 weeks    PT Treatment/Interventions  Manual techniques;Taping;Dry needling;Patient/family education;Therapeutic exercise;Therapeutic activities;Moist Heat;Neuromuscular re-education;Cryotherapy;Ultrasound;Iontophoresis 67m/ml Dexamethasone;Gait training;Functional mobility training;ADLs/Self Care Home Management    PT Next Visit Plan  Tx. focus left foot/ankle vs. back pending area of most pain, continue gastroc and peroneal stretching, STM left gastroc and foot, ankle strengthening, foot instrinsic ROM and strengthening, for back continue ROM, stretching, core strengthening and STM    PT Home Exercise Plan  PPT, quadratus stretch, tennis ball STW, ankle  isometrics and theraband 4-way, calf stretch,  hip flexor stretch, standing gastroc stretch     Consulted and Agree with Plan of Care  Patient       Patient will benefit from skilled therapeutic intervention in order to improve the following deficits and impairments:  Pain, Increased muscle spasms, Postural dysfunction, Decreased range of motion, Decreased strength, Abnormal gait, Decreased activity tolerance, Difficulty walking, Obesity  Visit Diagnosis: Pain in left ankle and joints of left foot  Chronic right-sided low back pain without sciatica  Muscle spasm of back  Abnormal posture     Problem List Patient Active Problem List   Diagnosis Date Noted  . Mass 05/20/2018  . Swelling 05/20/2018  . Trigger ring finger of left hand 05/20/2018  . Tendonitis of ankle, left 05/14/2018  .  Abnormal LFTs (liver function tests)   . Abdominal pain, RUQ   . Acute renal failure superimposed on chronic kidney disease ([place])   . Epigastric pain   . Transaminitis 04/30/2018  . Left hand pain 04/18/2018  . Sprain of ankle 03/25/2018  . Pain in lower jaw 01/16/2018  . Healthcare maintenance 12/11/2017  . Vaginal bleeding 10/09/2017  . Postmenopausal vaginal bleeding 10/04/2017  . Cough 09/28/2017  . Chronic pansinusitis 09/28/2017  . Laryngopharyngeal reflux (LPR) 08/28/2017  . Sensorineural hearing loss (SNHL), bilateral 08/28/2017  . Diarrhea in adult patient 08/04/2017  . Arm pain, anterior, right 06/17/2017  . OSA on CPAP 05/03/2017  . Morbid obesity due to excess calories ([place]) 05/03/2017  . Microscopic hematuria 03/27/2017  . Impingement syndrome of left shoulder 02/19/2017  . Hypersomnia 12/12/2016  . REM behavioral disorder 12/12/2016  . Asthma 12/12/2016  . Neck pain 11/17/2016  . CKD (chronic kidney disease), stage III ([place]) 11/02/2016  . CAD (coronary artery disease), native coronary artery 10/26/2016  . Poor social situation 10/13/2016  . Leg pain 08/30/2016  . Back  pain 08/30/2016  . 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 GERD (gastroesophageal reflux disease) 06/09/2016  . Obesity (BMI 30-39.9) 06/09/2016  . Facial swelling 05/21/2016  . Hyperlipidemia 05/17/2016  . Depression 05/17/2016  . Diabetic peripheral neuropathy ([place]) 05/17/2016  . Neuritis of upper extremity, C7 01/15/2015  . Hypertension 01/14/2015  . Numbness and tingling of right arm 01/14/2015  . Proliferative diabetic retinopathy ([place]) 02/24/2014  . Diabetes mellitus ([place]) 12/18/2013  . Nuclear cataract of both eyes 12/18/2013   Beaulah Dinning, PT, DPT 07/29/18 2:55 PM  [facility] [address] [place], [place], [address] Phone: [phone]   Fax:  [phone]  Name: Maurisa Tesmer MRN: [id number] Date of Birth: December 01, 1960

## 2018-07-31 ENCOUNTER — Other Ambulatory Visit: Payer: Self-pay | Admitting: Family Medicine

## 2018-07-31 DIAGNOSIS — E113513 Type 2 diabetes mellitus with proliferative diabetic retinopathy with macular edema, bilateral: Secondary | ICD-10-CM | POA: Diagnosis not present

## 2018-08-05 ENCOUNTER — Ambulatory Visit (INDEPENDENT_AMBULATORY_CARE_PROVIDER_SITE_OTHER): Payer: Medicare Other | Admitting: Family Medicine

## 2018-08-05 ENCOUNTER — Encounter: Payer: Self-pay | Admitting: Family Medicine

## 2018-08-05 VITALS — BP 118/70 | HR 107 | Temp 98.0°F | Wt 234.0 lb

## 2018-08-05 DIAGNOSIS — N183 Chronic kidney disease, stage 3 unspecified: Secondary | ICD-10-CM

## 2018-08-05 DIAGNOSIS — J45909 Unspecified asthma, uncomplicated: Secondary | ICD-10-CM

## 2018-08-05 DIAGNOSIS — G44209 Tension-type headache, unspecified, not intractable: Secondary | ICD-10-CM

## 2018-08-05 MED ORDER — ACETAMINOPHEN ER 650 MG PO TBCR: 650.0000 mg | EXTENDED_RELEASE_TABLET | Freq: Three times a day (TID) | ORAL | 1 refills | Status: DC | PRN

## 2018-08-05 NOTE — Progress Notes (Signed)
   [facility] Phone: [phone]   cc: f/u of syncopal episode, headache, asthma  Subjective:  Had fallen, passed out at the dinner table on the 7th of November.  When she came to she could hear them talking to 911 on the phone.  No seizures, did not hit her head.  She has diabetes and uses insulin.  The EMT said her blood pressure was really low, but sugars were normal.  Had given herself insuliln right before that.  5 U at 8am and 8pm and 50 U at night. She was confused right before passing out.  Takes blood pressuer medication as well.  Has not had syncopal episode since.  Continues to take her insulin.    She has had a headache on the right side and back of neck.  Has sinus headaches, tension and migraines. This feels more like tension in the back of the neck and sinus up front.  Had runny nose yesterday but that's it.  Has not been taking medications for it.  Lasts all day.  Nothing makes it worse or better.  6/10 pain, constant, non fluctuating.      Asthma. - has not been taking it daily.  Only used it twice.  Didn't know it was a daily medication.    ROS: See HPI for pertinent positives and negatives  Past Medical History  Family history reviewed for today's visit. No changes.   Objective: BP 118/70 (BP Location: Left Arm, Patient Position: Sitting, Cuff Size: Large)   Pulse (!) 107   Temp 98 F (36.7 C) (Oral)   Wt 234 lb (106.1 kg)   LMP  (LMP Unknown)   SpO2 95%   BMI 40.17 kg/m  Gen: NAD, alert and oriented, cooperative with exam HEENT: NCAT, EOMI, MMM CV: normal rate, regular rhythm. No murmurs, no rubs.  Resp: LCTAB, no wheezes, crackles. normal work of breathing Neuro: CN II-XII grossly intact. no focal neural deficits.  Strength equal bilaterally upper and lower extremities.   Skin: No rashes, no lesions Psych: Appropriate behavior  Assessment/Plan: CKD (chronic kidney disease), stage III (HCC) Creatinine was elevated above baseline  during hospital admission. Possibly due to hypovolemia as patient had hypotension and was given fluids in ED.  Ordered BMP.  Asthma Patient was not aware that she is supposed to be taking her flovent daily.  Patient states she will start taking it daily now.  Has only used it twice since she received it.   Acute non intractable tension-type headache Patient has had right sided headache since her syncopal episode.  She states she has had sinus, tension, and migraine headaches before and this feels like a tension headache in the back and her neck and a sinus headache up front.  Her neurological exam is normal. No focal neural deficits.  Patient was told to take tylenol daily for her headache.If headache persists she can discuss other options on her next visit.     Clemetine Marker, MD PGY-1

## 2018-08-05 NOTE — Patient Instructions (Addendum)
It was nice to meet you today,   Your blood pressure looks good.  I would like to check your kidney function again to make sure its normal again.    I am not concerned about your headaches being anything more serious than a headache.  You can take tylenol to help with the pain.   You should take your flovent inhaler once a day every day for it to work effectively.    I would like you to make an appointment with Dr. Shan Levans to discuss your diabetes and other chronic medical issues.  You can discuss your headaches with her at that appointment if you still have problems with them.    Have a great day,   Clemetine Marker, MD

## 2018-08-06 ENCOUNTER — Other Ambulatory Visit: Payer: Self-pay | Admitting: Family Medicine

## 2018-08-06 DIAGNOSIS — G44209 Tension-type headache, unspecified, not intractable: Secondary | ICD-10-CM | POA: Insufficient documentation

## 2018-08-06 LAB — BASIC METABOLIC PANEL
BUN/Creatinine Ratio: 29 — ABNORMAL HIGH (ref 9–23)
BUN: 49 mg/dL — ABNORMAL HIGH (ref 6–24)
CALCIUM: 9.1 mg/dL (ref 8.7–10.2)
CHLORIDE: 104 mmol/L (ref 96–106)
CO2: 20 mmol/L (ref 20–29)
Creatinine, Ser: 1.68 mg/dL — ABNORMAL HIGH (ref 0.57–1.00)
GFR calc non Af Amer: 34 mL/min/{1.73_m2} — ABNORMAL LOW (ref >59)
GFR, EST AFRICAN AMERICAN: 39 mL/min/{1.73_m2} — AB (ref >59)
GLUCOSE: 202 mg/dL — AB (ref 65–99)
Potassium: 4.6 mmol/L (ref 3.5–5.2)
Sodium: 141 mmol/L (ref 134–144)

## 2018-08-06 NOTE — Assessment & Plan Note (Signed)
Creatinine was elevated above baseline during hospital admission. Possibly due to hypovolemia as patient had hypotension and was given fluids in ED.  Ordered BMP.

## 2018-08-06 NOTE — Assessment & Plan Note (Signed)
Patient has had right sided headache since her syncopal episode.  She states she has had sinus, tension, and migraine headaches before and this feels like a tension headache in the back and her neck and a sinus headache up front.  Her neurological exam is normal. No focal neural deficits.  Patient was told to take tylenol daily for her headache.If headache persists she can discuss other options on her next visit.

## 2018-08-06 NOTE — Assessment & Plan Note (Signed)
Patient was not aware that she is supposed to be taking her flovent daily.  Patient states she will start taking it daily now.  Has only used it twice since she received it.

## 2018-08-11 ENCOUNTER — Encounter: Payer: Self-pay | Admitting: Podiatry

## 2018-08-11 NOTE — Progress Notes (Signed)
Subjective: Tamara Henry presents today with painful, thick toenails 1-5 b/l that she cannot cut and which interfere with daily activities.  Pain is aggravated when wearing enclosed shoe gear.  She has h/o diabetic neuropathy for which she takes gabapentin.  Tamara Henry voices no new problems on today's visit.  Objective: Vascular Examination: Capillary refill time immediate x 10 digits Dorsalis pedis and Posterior tibial pulses palpable b/l Digital hair x 10 digits absent b/l Skin temperature gradient WNL b/l  Dermatological Examination: Skin with normal turgor, texture and tone b/l  Toenails 1-5 b/l discolored, thick, dystrophic with subungual debris and pain with palpation to nailbeds due to thickness of nails.  Musculoskeletal: Muscle strength 5/5 to all LE muscle groups  Neurological: Protective sensation diminished with 10 gram monofilament.  Assessment: Painful onychomycosis toenails 1-5 b/l    Plan: 1. Toenails 1-5 b/l were debrided in length and girth without iatrogenic bleeding. 2. Patient to continue soft, supportive shoe gear 3. Avoid self trimming due to diabetes, neuropathy and use of long term blood thinner 4. Patient to report any pedal injuries to medical professional immediately. 5. Follow up 3 months. Patient/POA to call should there be a concern in the interim.

## 2018-08-13 ENCOUNTER — Ambulatory Visit: Payer: Medicare Other | Attending: Family Medicine | Admitting: Physical Therapy

## 2018-08-13 ENCOUNTER — Encounter: Payer: Self-pay | Admitting: Physical Therapy

## 2018-08-13 DIAGNOSIS — R293 Abnormal posture: Secondary | ICD-10-CM | POA: Insufficient documentation

## 2018-08-13 DIAGNOSIS — G8929 Other chronic pain: Secondary | ICD-10-CM

## 2018-08-13 DIAGNOSIS — M62830 Muscle spasm of back: Secondary | ICD-10-CM | POA: Diagnosis not present

## 2018-08-13 DIAGNOSIS — M545 Low back pain: Secondary | ICD-10-CM | POA: Insufficient documentation

## 2018-08-13 DIAGNOSIS — M25572 Pain in left ankle and joints of left foot: Secondary | ICD-10-CM

## 2018-08-13 NOTE — Therapy (Signed)
[place], [place], [address] Phone: [phone]   Fax:  [phone]  Physical Therapy Treatment  Patient Details  Name: Tamara Henry MRN: [id number] Date of Birth: 05/14/1961 Referring Provider (PT): Winfrey, A., Gambino, C.   Encounter Date: 08/13/2018  PT End of Session - 08/13/18 1507    Visit Number  11    Number of Visits  17    Date for PT Re-Evaluation  08/23/18    Authorization Type  [id number]    PT Start Time  1420    PT Stop Time  1500    PT Time Calculation (min)  40 min    Activity Tolerance  Patient tolerated treatment well    Behavior During Therapy  WFL for tasks assessed/performed       Past Medical History:  Diagnosis Date  . Acid reflux   . Allergy    seasonal  . Anxiety   . Arthritis    lower back, feet  . Asthma    albuterol inhaler - rarely uses - only needs when she gets sick  . Back pain   . Bilateral pulmonary embolism ([place]) 05/2016  . Bipolar disorder ([place])   . CAD in native artery    S/P NSTEMI with cath showing severe 3 vessel ASCAD s/p CABGx4 07/28/16.  . Cataracts, bilateral   . CKD (chronic kidney disease), stage III (HCC)    borderline CKD II-III  . Cocaine abuse ([place])    In remission. Stopped using in early 2000's  . Depression   . Diabetes mellitus ([place])    Type II  . GERD (gastroesophageal reflux disease)   . Glaucoma   . High cholesterol   . History of blood transfusion 2017  . History of pulmonary embolus (PE)   . 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 Hx of chest tube placement right   . Hypertension   . Morbid obesity ([place])   . Myocardial infarction ([place]) 2017  . Neuromuscular disorder (HCC)    neuropathy in hands and feet  . Neuropathy    hands and feet  . Pneumonia   . Postoperative anemia 07/2016  . PTSD (post-traumatic stress disorder)   . Sleep apnea    Does not use cpap  . Stroke [facility]) 2005   no residual  . SVD (spontaneous vaginal delivery)    x 4     Past Surgical History:  Procedure Laterality Date  . CARDIAC CATHETERIZATION N/A 07/25/2016   Procedure: Left Heart Cath and Coronary Angiography;  Surgeon: Peter M Martinique, MD;  Location: [facility];  Service: Cardiovascular;  Laterality: N/A;  . COLONOSCOPY    . COLONOSCOPY W/ POLYPECTOMY    . CORONARY ARTERY BYPASS GRAFT N/A 07/28/2016   Procedure: CORONARY ARTERY BYPASS GRAFTING (CABG)x4 using left internal mammary and endoscopic harvest of right greater saphenous vein;  Surgeon: Ivin Poot, MD;  Location: [facility];  Service: Open Heart Surgery;  Laterality: N/A;  . DENTAL SURGERY     to removed broken teeth  . HYSTEROSCOPY W/D&C N/A 01/31/2018   Procedure: DILATATION AND CURETTAGE /HYSTEROSCOPY;  Surgeon: Woodroe Mode, MD;  Location: [facility];  Service: Gynecology;  Laterality: N/A;  . POLYPECTOMY    . PULMONARY EMBOLISM SURGERY  2017   [facility]  . SHOULDER ARTHROSCOPY WITH ROTATOR CUFF REPAIR Left 02/19/2017   Procedure: LEFT SHOULDER ARTHROSCOPY with debridement andROTATOR CUFF REPAIR;  Surgeon: [name], MD;  Location: [facility];  Service:  Orthopedics;  Laterality: Left;  . TEE WITHOUT CARDIOVERSION N/A 07/28/2016   Procedure: TRANSESOPHAGEAL ECHOCARDIOGRAM (TEE);  Surgeon: Ivin Poot, MD;  Location: [facility];  Service: Open Heart Surgery;  Laterality: N/A;  . TONSILLECTOMY    . TUBAL LIGATION      There were no vitals filed for this visit.  Subjective Assessment - 08/13/18 1423    Subjective  She is doing exercises.  She is getting epidural soon.      Currently in Pain?  Yes    Pain Score  0-No pain   up to 10 /10   Pain Location  Back    Pain Orientation  Right;Lower;Left    Pain Descriptors / Indicators  Sharp;Nagging    Pain Type  Chronic pain    Aggravating Factors   longer standing  and walking    Pain Relieving Factors  sitting,, rest ,  change position. heat.    Effect of Pain on Daily Activities  needs scooter to shop.   leans on basket if no  scooter.       Multiple Pain Sites  No   antalgic gait  after 200 feet   Pain Location  Ankle    Pain Orientation  Left    Pain Descriptors / Indicators  Aching    Aggravating Factors   weightbearing    Pain Relieving Factors  heat         [facility] PT Assessment - 08/13/18 0001      AROM   Left Ankle Dorsiflexion  11    Left Ankle Plantar Flexion  42    Left Ankle Inversion  39    Left Ankle Eversion  18   no pain AROM                  [facility] Adult PT Treatment/Exercise - 08/13/18 0001      Ankle Exercises: Supine   Other Supine Ankle Exercises  manually resister 4 way ROM 2 sets 10 X each no pain.      Ankle Exercises: Seated   Other Seated Ankle Exercises  to YOGA  foot intrensics.  10 C  toes down with lifter great toe difficult     Other Seated Ankle Exercises  sit to stand 10 X.  knee fatigue,  no pain      Ankle Exercises: Standing   SLS  13    Rocker Board  2 minutes   feels good heels down,  SBA   Heel Raises Limitations  10   holds chair lightly.  Moves forward vs up   Other Standing Ankle Exercises  various walking exercises softer walking with increased trunk rotation cues worked best.              PT Education - 08/13/18 1507    Education Details  exercise form    Person(s) Educated  Patient    Methods  Explanation;Demonstration;Tactile cues;Verbal cues    Comprehension  Returned demonstration;Verbalized understanding       PT Short Term Goals - 08/13/18 1514      PT SHORT TERM GOAL #1   Title  Pt. will be independent with initial HEP    Baseline  independent with HEP    Time  3    Period  Weeks    Status  Achieved      PT SHORT TERM GOAL #2   Title  She will report 30% decr in intensity of episodes of LBP    Time  3    Period  Weeks    Status  Achieved        PT Long Term Goals - 08/13/18 1515      PT LONG TERM GOAL #1   Title  She will be independent with all HEP issued    Baseline  updates ongoing    Time  4     Period  Weeks    Status  On-going      PT LONG TERM GOAL #2   Title  She will report episodes of LBP and LT ankle to dec to once over 2 weeks     Time  4    Period  Weeks    Status  Unable to assess      PT LONG TERM GOAL #3   Title  Perform ADLs/IADLs such as chores at home, waiting at bus stop, brief trips to grocery store with back and ankle pain 3/10 or less    Baseline  pain variable with longer standing/ walking    Time  4    Period  Weeks    Status  On-going      PT LONG TERM GOAL #4   Title  Pt. to ambulate for short trips to grocery store at least 20 min without needing to use motorized cart.    Baseline  uses leaning on shopping cart when motorized cart not available.  Short duration on feet.    Time  4    Period  Weeks    Status  On-going      PT LONG TERM GOAL #5   Title  Left ankle strength grossly 5/5 to improve stability for walking outdoors over uneven surfaces    Time  4    Period  Weeks    Status  Unable to assess            Plan - 08/13/18 1508    Clinical Impression Statement  11 degrees AROM DF left ( greatly improved,  no edema evident ankle,  mild edema achilles.  Walking continues to be limites to about 200 feet prior to pain back and foot both limiting.    Focus on ankle/ foot today.  no pain increased at end of session.  She can walk with leaning on shopping cart in small stores for short periods of time.     PT Next Visit Plan  POC through 08/23/2018.  MD visit after next session.   Address back or foot as needed.     PT Home Exercise Plan  PPT, quadratus stretch, tennis ball STW, ankle isometrics and theraband 4-way, calf stretch,  hip flexor stretch, standing gastroc stretch     Consulted and Agree with Plan of Care  Patient       Patient will benefit from skilled therapeutic intervention in order to improve the following deficits and impairments:     Visit Diagnosis: Pain in left ankle and joints of left foot  Chronic right-sided low back  pain without sciatica  Muscle spasm of back  Abnormal posture     Problem List Patient Active Problem List   Diagnosis Date Noted  . Acute non intractable tension-type headache 08/06/2018  . Mass 05/20/2018  . Swelling 05/20/2018  . Trigger ring finger of left hand 05/20/2018  . Tendonitis of ankle, left 05/14/2018  . Abnormal LFTs (liver function tests)   . Abdominal pain, RUQ   . Acute renal failure superimposed on chronic kidney disease ([place])   . Epigastric  pain   . Transaminitis 04/30/2018  . Left hand pain 04/18/2018  . Sprain of ankle 03/25/2018  . Pain in lower jaw 01/16/2018  . Healthcare maintenance 12/11/2017  . Vaginal bleeding 10/09/2017  . Postmenopausal vaginal bleeding 10/04/2017  . Cough 09/28/2017  . Chronic pansinusitis 09/28/2017  . Laryngopharyngeal reflux (LPR) 08/28/2017  . Sensorineural hearing loss (SNHL), bilateral 08/28/2017  . Diarrhea in adult patient 08/04/2017  . Arm pain, anterior, right 06/17/2017  . OSA on CPAP 05/03/2017  . Morbid obesity due to excess calories ([place]) 05/03/2017  . Microscopic hematuria 03/27/2017  . Impingement syndrome of left shoulder 02/19/2017  . Hypersomnia 12/12/2016  . REM behavioral disorder 12/12/2016  . Asthma 12/12/2016  . Neck pain 11/17/2016  . CKD (chronic kidney disease), stage III ([place]) 11/02/2016  . CAD (coronary artery disease), native coronary artery 10/26/2016  . Poor social situation 10/13/2016  . Leg pain 08/30/2016  . Back pain 08/30/2016  . 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 GERD (gastroesophageal reflux disease) 06/09/2016  . Obesity (BMI 30-39.9) 06/09/2016  . Facial swelling 05/21/2016  . Hyperlipidemia 05/17/2016  . Depression 05/17/2016  . Diabetic peripheral neuropathy ([place]) 05/17/2016  . Neuritis of upper extremity, C7 01/15/2015  . Hypertension 01/14/2015  . Numbness and tingling of right arm 01/14/2015  . Proliferative diabetic retinopathy ([place]) 02/24/2014  . Diabetes mellitus ([place])  12/18/2013  . Nuclear cataract of both eyes 12/18/2013    Carys Malina PTA 08/13/2018, 3:20 PM  [place], [place], [address] Phone: [phone]   Fax:  [phone]  Name: Tamara Henry MRN: [id number] Date of Birth: 08-Aug-1961

## 2018-08-14 ENCOUNTER — Ambulatory Visit (INDEPENDENT_AMBULATORY_CARE_PROVIDER_SITE_OTHER): Payer: Medicare Other | Admitting: Orthopaedic Surgery

## 2018-08-15 ENCOUNTER — Ambulatory Visit (INDEPENDENT_AMBULATORY_CARE_PROVIDER_SITE_OTHER): Payer: Medicare Other | Admitting: Physical Medicine and Rehabilitation

## 2018-08-15 ENCOUNTER — Encounter: Payer: Self-pay | Admitting: Physical Therapy

## 2018-08-15 ENCOUNTER — Encounter (INDEPENDENT_AMBULATORY_CARE_PROVIDER_SITE_OTHER): Payer: Self-pay | Admitting: Physical Medicine and Rehabilitation

## 2018-08-15 ENCOUNTER — Ambulatory Visit (INDEPENDENT_AMBULATORY_CARE_PROVIDER_SITE_OTHER): Payer: Self-pay

## 2018-08-15 VITALS — BP 125/71 | HR 81 | Temp 98.0°F

## 2018-08-15 DIAGNOSIS — M5116 Intervertebral disc disorders with radiculopathy, lumbar region: Secondary | ICD-10-CM

## 2018-08-15 DIAGNOSIS — M5416 Radiculopathy, lumbar region: Secondary | ICD-10-CM

## 2018-08-15 MED ORDER — METHYLPREDNISOLONE ACETATE 80 MG/ML IJ SUSP
80.0000 mg | Freq: Once | INTRAMUSCULAR | Status: AC
  Administered 2018-08-15: 80 mg

## 2018-08-15 NOTE — Patient Instructions (Signed)

## 2018-08-15 NOTE — Progress Notes (Signed)
   Numeric Pain Rating Scale and Functional Assessment Average Pain 10   In the last MONTH (on 0-10 scale) has pain interfered with the following?  1. General activity like being  able to carry out your everyday physical activities such as walking, climbing stairs, carrying groceries, or moving a chair?  Rating(8)   +Driver, -BT, -Dye Allergies.  

## 2018-08-19 ENCOUNTER — Other Ambulatory Visit: Payer: Self-pay | Admitting: Family Medicine

## 2018-08-20 ENCOUNTER — Ambulatory Visit: Payer: Medicare Other | Admitting: Physical Therapy

## 2018-08-20 ENCOUNTER — Encounter: Payer: Self-pay | Admitting: Physical Therapy

## 2018-08-20 DIAGNOSIS — G8929 Other chronic pain: Secondary | ICD-10-CM | POA: Diagnosis not present

## 2018-08-20 DIAGNOSIS — R293 Abnormal posture: Secondary | ICD-10-CM

## 2018-08-20 DIAGNOSIS — M62830 Muscle spasm of back: Secondary | ICD-10-CM | POA: Diagnosis not present

## 2018-08-20 DIAGNOSIS — M545 Low back pain, unspecified: Secondary | ICD-10-CM

## 2018-08-20 DIAGNOSIS — M25572 Pain in left ankle and joints of left foot: Secondary | ICD-10-CM

## 2018-08-20 NOTE — Therapy (Signed)
[place], [place], [address] Phone: [phone]   Fax:  [phone]  Physical Therapy Treatment/Discharge  Patient Details  Name: Tamara Henry MRN: [id number] Date of Birth: 1960-10-20 Referring Provider (PT): Winfrey, A., Gambino, C.   Encounter Date: 08/20/2018  PT End of Session - 08/20/18 1502    Visit Number  12    Number of Visits  17    Date for PT Re-Evaluation  08/23/18    PT Start Time  6144    PT Stop Time  1452    PT Time Calculation (min)  50 min    Activity Tolerance  Patient tolerated treatment well    Behavior During Therapy  [facility] for tasks assessed/performed       Past Medical History:  Diagnosis Date  . Acid reflux   . Allergy    seasonal  . Anxiety   . Arthritis    lower back, feet  . Asthma    albuterol inhaler - rarely uses - only needs when she gets sick  . Back pain   . Bilateral pulmonary embolism ([place]) 05/2016  . Bipolar disorder ([place])   . CAD in native artery    S/P NSTEMI with cath showing severe 3 vessel ASCAD s/p CABGx4 07/28/16.  . Cataracts, bilateral   . CKD (chronic kidney disease), stage III (HCC)    borderline CKD II-III  . Cocaine abuse ([place])    In remission. Stopped using in early 2000's  . Depression   . Diabetes mellitus ([place])    Type II  . GERD (gastroesophageal reflux disease)   . Glaucoma   . High cholesterol   . History of blood transfusion 2017  . History of pulmonary embolus (PE)   . 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 Hx of chest tube placement right   . Hypertension   . Morbid obesity ([place])   . Myocardial infarction ([place]) 2017  . Neuromuscular disorder (HCC)    neuropathy in hands and feet  . Neuropathy    hands and feet  . Pneumonia   . Postoperative anemia 07/2016  . PTSD (post-traumatic stress disorder)   . Sleep apnea    Does not use cpap  . Stroke [facility]) 2005   no residual  . SVD (spontaneous vaginal delivery)    x 4    Past  Surgical History:  Procedure Laterality Date  . CARDIAC CATHETERIZATION N/A 07/25/2016   Procedure: Left Heart Cath and Coronary Angiography;  Surgeon: Peter M Martinique, MD;  Location: [place] [facility];  Service: Cardiovascular;  Laterality: N/A;  . COLONOSCOPY    . COLONOSCOPY W/ POLYPECTOMY    . CORONARY ARTERY BYPASS GRAFT N/A 07/28/2016   Procedure: CORONARY ARTERY BYPASS GRAFTING (CABG)x4 using left internal mammary and endoscopic harvest of right greater saphenous vein;  Surgeon: Ivin Poot, MD;  Location: [place];  Service: Open Heart Surgery;  Laterality: N/A;  . DENTAL SURGERY     to removed broken teeth  . HYSTEROSCOPY W/D&C N/A 01/31/2018   Procedure: DILATATION AND CURETTAGE /HYSTEROSCOPY;  Surgeon: Woodroe Mode, MD;  Location: [place] [facility];  Service: Gynecology;  Laterality: N/A;  . POLYPECTOMY    . PULMONARY EMBOLISM SURGERY  2017   [facility]  . SHOULDER ARTHROSCOPY WITH ROTATOR CUFF REPAIR Left 02/19/2017   Procedure: LEFT SHOULDER ARTHROSCOPY with debridement andROTATOR CUFF REPAIR;  Surgeon: [name], MD;  Location: [place];  Service: Orthopedics;  Laterality: Left;  . TEE  WITHOUT CARDIOVERSION N/A 07/28/2016   Procedure: TRANSESOPHAGEAL ECHOCARDIOGRAM (TEE);  Surgeon: Ivin Poot, MD;  Location: [place];  Service: Open Heart Surgery;  Laterality: N/A;  . TONSILLECTOMY    . TUBAL LIGATION      There were no vitals filed for this visit.  Subjective Assessment - 08/20/18 1455    Subjective  Pt. had epidural for neck last week. She reports left ankle doing well. No low back pain pre-tx. but pt. reports continued LBP particularly with prolonged standing at the bus stop, No LE radiating/radicular symptoms as noted. Pt. now has membership at local [employer]-discusses overall status and pt. in agreement with plans to d/c to HEP, will continue with home and gym exercises.    Currently in Pain?  No/denies    Pain Score  0-No pain         [facility] PT Assessment - 08/20/18 0001       Observation/Other Assessments   Focus on Therapeutic Outcomes (FOTO)   58% limited      AROM   Left Ankle Dorsiflexion  7    Left Ankle Plantar Flexion  60    Left Ankle Inversion  40    Left Ankle Eversion  20    Lumbar Flexion  85    Lumbar Extension  25    Lumbar - Right Side Bend  32    Lumbar - Left Side Bend  25    Lumbar - Right Rotation  75%    Lumbar - Left Rotation  50%      Strength   Overall Strength Comments  Left ankle EV 4+/5 otherwise left foot/ankle 5/5, LE otherwise grossly WNL                   [facility] Adult PT Treatment/Exercise - 08/20/18 0001      Lumbar Exercises: Stretches   Passive Hamstring Stretch  Right;Left;3 reps;30 seconds    Single Knee to Chest Stretch  Right;Left;5 reps   5-10 sec holds     Lumbar Exercises: Supine   Pelvic Tilt  15 reps    Clam  15 reps    Clam Limitations  --   Green Theraband   Bridge  15 reps    Isometric Hip Flexion  10 reps    Other Supine Lumbar Exercises  dead bugs x 15 ea. bilat.      Moist Heat Therapy   Number Minutes Moist Heat  10 Minutes    Moist Heat Location  Lumbar Spine      Manual Therapy   Manual Therapy  Soft tissue mobilization    Soft tissue mobilization  STM bilat. lumbar paraspinals in right sidelying             PT Education - 08/20/18 1501    Education Details  HEP updates, gym exercises after d/c    Person(s) Educated  Patient    Methods  Explanation;Demonstration;Tactile cues;Verbal cues;Handout    Comprehension  Verbal cues required;Tactile cues required;Returned demonstration;Verbalized understanding       PT Short Term Goals - 08/20/18 1505      PT SHORT TERM GOAL #1   Title  Pt. will be independent with initial HEP    Baseline  independent with HEP    Time  3    Period  Weeks    Status  Achieved      PT SHORT TERM GOAL #2   Title  She will report 30% decr in intensity of episodes  of LBP    Time  3    Period  Weeks    Status  Achieved        PT Long  Term Goals - 08/20/18 1506      PT LONG TERM GOAL #1   Title  She will be independent with all HEP issued    Baseline  met    Time  4    Period  Weeks    Status  Achieved      PT LONG TERM GOAL #2   Title  She will report episodes of LBP and LT ankle to dec to once over 2 weeks     Baseline  met for ankle but not for back    Time  4    Period  Weeks    Status  Partially Met      PT LONG TERM GOAL #3   Title  Perform ADLs/IADLs such as chores at home, waiting at bus stop, brief trips to grocery store with back and ankle pain 3/10 or less    Baseline  met for ankle but not for back    Time  4    Period  Weeks    Status  Partially Met      PT LONG TERM GOAL #4   Title  Pt. to ambulate for short trips to grocery store at least 20 min without needing to use motorized cart.    Baseline  uses leaning on shopping cart when motorized cart not available.  Short duration on feet.    Time  4    Period  Weeks            Plan - 08/20/18 1502    Clinical Impression Statement  Pt. has attended 12 therapy visits since early September for lumbar and left ankle regions. Ankle pain improved. Still with limitations for standing tolerance due to LBP but pt. is independent with HEP and hopeful at this point that she can continue progress with this along with exercise routine at [facility] for continued core and lumbar strengthening, stretches. D/c to HEP today-no further formal therapy planned at this time. Pt. to follow up with MD with any changes in status.    Clinical Impairments Affecting Rehab Potential  chronic LBP history, comorbidities    PT Frequency  --   NA-d/c today   PT Duration  --   NA-d/c today   PT Treatment/Interventions  Manual techniques;Taping;Dry needling;Patient/family education;Therapeutic exercise;Therapeutic activities;Moist Heat;Neuromuscular re-education;Cryotherapy;Ultrasound;Iontophoresis 1m/ml Dexamethasone;Gait training;Functional mobility training;ADLs/Self Care Home  Management    PT Next Visit Plan  NA    PT Home Exercise Plan  see pt. instructions    Consulted and Agree with Plan of Care  Patient       Patient will benefit from skilled therapeutic intervention in order to improve the following deficits and impairments:  Pain, Increased muscle spasms, Postural dysfunction, Decreased range of motion, Decreased strength, Abnormal gait, Decreased activity tolerance, Difficulty walking, Obesity  Visit Diagnosis: Pain in left ankle and joints of left foot  Chronic right-sided low back pain without sciatica  Muscle spasm of back  Abnormal posture     Problem List Patient Active Problem List   Diagnosis Date Noted  . Acute non intractable tension-type headache 08/06/2018  . Mass 05/20/2018  . Swelling 05/20/2018  . Trigger ring finger of left hand 05/20/2018  . Tendonitis of ankle, left 05/14/2018  . Abnormal LFTs (liver function tests)   . Abdominal pain, RUQ   .  Acute renal failure superimposed on chronic kidney disease ([place])   . Epigastric pain   . Transaminitis 04/30/2018  . Left hand pain 04/18/2018  . Sprain of ankle 03/25/2018  . Pain in lower jaw 01/16/2018  . Healthcare maintenance 12/11/2017  . Vaginal bleeding 10/09/2017  . Postmenopausal vaginal bleeding 10/04/2017  . Cough 09/28/2017  . Chronic pansinusitis 09/28/2017  . Laryngopharyngeal reflux (LPR) 08/28/2017  . Sensorineural hearing loss (SNHL), bilateral 08/28/2017  . Diarrhea in adult patient 08/04/2017  . Arm pain, anterior, right 06/17/2017  . OSA on CPAP 05/03/2017  . Morbid obesity due to excess calories ([place]) 05/03/2017  . Microscopic hematuria 03/27/2017  . Impingement syndrome of left shoulder 02/19/2017  . Hypersomnia 12/12/2016  . REM behavioral disorder 12/12/2016  . Asthma 12/12/2016  . Neck pain 11/17/2016  . CKD (chronic kidney disease), stage III ([place]) 11/02/2016  . CAD (coronary artery disease), native coronary artery 10/26/2016  . Poor social  situation 10/13/2016  . Leg pain 08/30/2016  . Back pain 08/30/2016  . 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 GERD (gastroesophageal reflux disease) 06/09/2016  . Obesity (BMI 30-39.9) 06/09/2016  . Facial swelling 05/21/2016  . Hyperlipidemia 05/17/2016  . Depression 05/17/2016  . Diabetic peripheral neuropathy ([place]) 05/17/2016  . Neuritis of upper extremity, C7 01/15/2015  . Hypertension 01/14/2015  . Numbness and tingling of right arm 01/14/2015  . Proliferative diabetic retinopathy ([place]) 02/24/2014  . Diabetes mellitus ([place]) 12/18/2013  . Nuclear cataract of both eyes 12/18/2013        PHYSICAL THERAPY DISCHARGE SUMMARY  Visits from Start of Care: 12  Current functional level related to goals / functional outcomes: Ankle improved. Still with limited standing tolerance from LBP. D/c to HEP-Patient will continue with home exercises along with exercise at local [employer] to continue progress.  Remaining deficits: Limited standing tolerance due to LBP, mild left ankle weakness with tendonitis   Education / Equipment: HEP, issues Blue Theraband for HEP Plan: Patient agrees to discharge.  Patient goals were partially met. Patient is being discharged due to                                                     ?????           Beaulah Dinning, PT, DPT 08/20/18 3:10 PM        [facility] [address] [place], [place], [address] Phone: [phone]   Fax:  [phone]  Name: Eleshia Wooley MRN: [id number] Date of Birth: 03-Sep-1961

## 2018-08-21 ENCOUNTER — Ambulatory Visit (INDEPENDENT_AMBULATORY_CARE_PROVIDER_SITE_OTHER): Payer: Medicare Other | Admitting: Orthopaedic Surgery

## 2018-08-21 ENCOUNTER — Ambulatory Visit (INDEPENDENT_AMBULATORY_CARE_PROVIDER_SITE_OTHER): Payer: Self-pay

## 2018-08-21 ENCOUNTER — Encounter (INDEPENDENT_AMBULATORY_CARE_PROVIDER_SITE_OTHER): Payer: Self-pay | Admitting: Orthopaedic Surgery

## 2018-08-21 VITALS — BP 137/81 | HR 87

## 2018-08-21 DIAGNOSIS — M720 Palmar fascial fibromatosis [Dupuytren]: Secondary | ICD-10-CM

## 2018-08-21 DIAGNOSIS — M79642 Pain in left hand: Secondary | ICD-10-CM | POA: Diagnosis not present

## 2018-08-21 NOTE — Progress Notes (Signed)
Office Visit Note   Patient: Tamara Henry           Date of Birth: 09/02/1961           MRN: [id number] Visit Date: 08/21/2018              Requested by: Kathrene Alu, MD [address], [place] [address] PCP: Kathrene Alu, MD   Assessment & Plan: Visit Diagnoses:  1. Pain in left hand   2. Dupuytren's contracture of hand   3.    Left webspace mass.  Plan: Patient has painful Dupuytren's in her left hand without finger contracture.  It bothers her with gripping and squeezing.  She also has the mass in her webspace raised and pedunculated.  It bothers her when she uses handles doors kitchen equipment.  She is tried using different gloves on her hands but still has problems with both areas.  She like to have the Dupuytren's excised from her palm as well as removal of the mass in the thumb index webspace.  This could be done as an outpatient under single procedure.  Follow-Up Instructions: No follow-ups on file.   Orders:  Orders Placed This Encounter  Procedures  . XR Hand Complete Left   No orders of the defined types were placed in this encounter.     Procedures: No procedures performed   Clinical Data: No additional findings.   Subjective: Chief Complaint  Patient presents with  . Lower Back - Pain, Follow-up  . Left Hand - Pain    HPI patient returns post lumbar epidural 08/15/2018 and states she is gotten improvement in her pain and she is moving better.  She still taking muscle relaxants occasionally.  She is had increased problems with Dupuytren's worse on the left than right hand involving the palm without contracture of the long or ring finger.  She has some callus overlying it and it bothers her with daily activities.  She is noticed over the last 2 months gradual enlargement of a mass in the index thumb web space and the skin which is pedunculated and 5 to 6 mm.  She does not recall any history of trauma to the hand or webspace.  Left  shoulder arthroscopy rotator cuff repair from 2018 is doing well.  Review of Systems previous CABG x4.  Patient has some stage III kidney disease.  Previous rotator cuff repair.  Diabetic neuropathy depression, asthma.  [place] retinopathy and obesity.  Otherwise negative is a pertains HPI.  No current chest pain or shortness of breath.  No exertional angina.   Objective: Vital Signs: BP 137/81   Pulse 87   LMP  (LMP Unknown)   Physical Exam Constitutional:      Appearance: She is well-developed.  HENT:     Head: Normocephalic.     Right Ear: External ear normal.     Left Ear: External ear normal.  Eyes:     Pupils: Pupils are equal, round, and reactive to light.  Neck:     Thyroid: No thyromegaly.     Trachea: No tracheal deviation.  Cardiovascular:     Rate and Rhythm: Normal rate.     Comments: Healed median sternotomy incision Pulmonary:     Effort: Pulmonary effort is normal.     Breath sounds: No wheezing or rhonchi.  Abdominal:     Palpations: Abdomen is soft.  Skin:    General: Skin is warm and dry.  Neurological:  Mental Status: She is alert and oriented to person, place, and time.  Psychiatric:        Behavior: Behavior normal.     Ortho Exam patient has bilateral Dupuytren's in the palm more symptomatic on the left hand than right hand with the cord overlying midportion of the palm without flexion tendon contracture.  MCP PIP long and ring finger both reach full extension without contracture.  Carpal tunnel exam is negative.  Patient has a raised pedunculated mass in the index thumb web space with small central tip.  Mass is 5 to 6 mm with 1 mm central tip slightly darkened.  She has tenderness with gripping in the webspace.  Two-point sensation in the fingertips is intact.  Good capillary refill.  Good cervical range of motion no brachial plexus tenderness.  Specialty Comments:  No specialty comments available.  Imaging: No results found.   PMFS  History: Patient Active Problem List   Diagnosis Date Noted  . Acute non intractable tension-type headache 08/06/2018  . Mass 05/20/2018  . Swelling 05/20/2018  . Trigger ring finger of left hand 05/20/2018  . Tendonitis of ankle, left 05/14/2018  . Abnormal LFTs (liver function tests)   . Abdominal pain, RUQ   . Acute renal failure superimposed on chronic kidney disease ([place])   . Epigastric pain   . Transaminitis 04/30/2018  . Left hand pain 04/18/2018  . Sprain of ankle 03/25/2018  . Pain in lower jaw 01/16/2018  . Healthcare maintenance 12/11/2017  . Vaginal bleeding 10/09/2017  . Postmenopausal vaginal bleeding 10/04/2017  . Cough 09/28/2017  . Chronic pansinusitis 09/28/2017  . Laryngopharyngeal reflux (LPR) 08/28/2017  . Sensorineural hearing loss (SNHL), bilateral 08/28/2017  . Diarrhea in adult patient 08/04/2017  . Arm pain, anterior, right 06/17/2017  . OSA on CPAP 05/03/2017  . Morbid obesity due to excess calories ([place]) 05/03/2017  . Microscopic hematuria 03/27/2017  . Impingement syndrome of left shoulder 02/19/2017  . Hypersomnia 12/12/2016  . REM behavioral disorder 12/12/2016  . Asthma 12/12/2016  . Neck pain 11/17/2016  . CKD (chronic kidney disease), stage III ([place]) 11/02/2016  . CAD (coronary artery disease), native coronary artery 10/26/2016  . Poor social situation 10/13/2016  . Leg pain 08/30/2016  . Back pain 08/30/2016  . 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 GERD (gastroesophageal reflux disease) 06/09/2016  . Obesity (BMI 30-39.9) 06/09/2016  . Facial swelling 05/21/2016  . Hyperlipidemia 05/17/2016  . Depression 05/17/2016  . Diabetic peripheral neuropathy ([place]) 05/17/2016  . Neuritis of upper extremity, C7 01/15/2015  . Hypertension 01/14/2015  . Numbness and tingling of right arm 01/14/2015  . Proliferative diabetic retinopathy ([place]) 02/24/2014  . Diabetes mellitus ([place]) 12/18/2013  . Nuclear cataract of both eyes 12/18/2013   Past Medical  History:  Diagnosis Date  . Acid reflux   . Allergy    seasonal  . Anxiety   . Arthritis    lower back, feet  . Asthma    albuterol inhaler - rarely uses - only needs when she gets sick  . Back pain   . Bilateral pulmonary embolism ([place]) 05/2016  . Bipolar disorder ([place])   . CAD in native artery    S/P NSTEMI with cath showing severe 3 vessel ASCAD s/p CABGx4 07/28/16.  . Cataracts, bilateral   . CKD (chronic kidney disease), stage III (HCC)    borderline CKD II-III  . Cocaine abuse ([place])    In remission. Stopped using in early 2000's  . Depression   .  Diabetes mellitus ([place])    Type II  . GERD (gastroesophageal reflux disease)   . Glaucoma   . High cholesterol   . History of blood transfusion 2017  . History of pulmonary embolus (PE)   . 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 Hx of chest tube placement right   . Hypertension   . Morbid obesity ([place])   . Myocardial infarction ([place]) 2017  . Neuromuscular disorder (HCC)    neuropathy in hands and feet  . Neuropathy    hands and feet  . Pneumonia   . Postoperative anemia 07/2016  . PTSD (post-traumatic stress disorder)   . Sleep apnea    Does not use cpap  . Stroke [facility]) 2005   no residual  . SVD (spontaneous vaginal delivery)    x 4    Family History  Problem Relation Age of Onset  . Pancreatitis Mother   . Diabetes type II Mother   . Diabetes Mother   . CAD Father   . Heart attack Father   . Diabetes type II Sister   . Diabetes Sister   . Diabetes type II Brother   . Diabetes Brother   . Diabetes Brother   . Diabetes Brother   . Clotting disorder Son        heart attack x 3   . Breast cancer Maternal Aunt   . Colon cancer Neg Hx   . Stomach cancer Neg Hx     Past Surgical History:  Procedure Laterality Date  . CARDIAC CATHETERIZATION N/A 07/25/2016   Procedure: Left Heart Cath and Coronary Angiography;  Surgeon: Peter M Martinique, MD;  Location: [place] [facility];  Service: Cardiovascular;  Laterality: N/A;  .  COLONOSCOPY    . COLONOSCOPY W/ POLYPECTOMY    . CORONARY ARTERY BYPASS GRAFT N/A 07/28/2016   Procedure: CORONARY ARTERY BYPASS GRAFTING (CABG)x4 using left internal mammary and endoscopic harvest of right greater saphenous vein;  Surgeon: Ivin Poot, MD;  Location: [place];  Service: Open Heart Surgery;  Laterality: N/A;  . DENTAL SURGERY     to removed broken teeth  . HYSTEROSCOPY W/D&C N/A 01/31/2018   Procedure: DILATATION AND CURETTAGE /HYSTEROSCOPY;  Surgeon: Woodroe Mode, MD;  Location: [facility];  Service: Gynecology;  Laterality: N/A;  . POLYPECTOMY    . PULMONARY EMBOLISM SURGERY  2017   [facility]  . SHOULDER ARTHROSCOPY WITH ROTATOR CUFF REPAIR Left 02/19/2017   Procedure: LEFT SHOULDER ARTHROSCOPY with debridement andROTATOR CUFF REPAIR;  Surgeon: [name], MD;  Location: [place];  Service: Orthopedics;  Laterality: Left;  . TEE WITHOUT CARDIOVERSION N/A 07/28/2016   Procedure: TRANSESOPHAGEAL ECHOCARDIOGRAM (TEE);  Surgeon: Ivin Poot, MD;  Location: [place];  Service: Open Heart Surgery;  Laterality: N/A;  . TONSILLECTOMY    . TUBAL LIGATION     Social History   Occupational History  . Occupation: disabled  Tobacco Use  . Smoking status: Former Smoker    Packs/day: 2.00    Years: 10.00    Pack years: 20.00    Types: Cigarettes    Last attempt to quit: 09/11/2005    Years since quitting: 12.9  . Smokeless tobacco: Never Used  Substance and Sexual Activity  . Alcohol use: No  . Drug use: Not Currently    Types: Cocaine    Comment: History Cocaine - last use in 2000's  . Sexual activity: Not Currently    Birth control/protection: Surgical

## 2018-08-22 ENCOUNTER — Encounter: Payer: Self-pay | Admitting: Physical Therapy

## 2018-08-30 ENCOUNTER — Other Ambulatory Visit: Payer: Self-pay | Admitting: *Deleted

## 2018-08-30 NOTE — Patient Outreach (Addendum)
[facility]) Care Management  08/30/2018  Monay Houlton November 20, 1960 [id number]   RN Health Coach Monthly Outreach  Referral Date:04/17/2018 Referral Source:EMMI Prevent Screening Reason for Referral:Disease Management Education, Medication compliance packaging Insurance:[employer] Medicare   Outreach Attempt:  Multiple attempts to establish contact with patient without success. No response from letter mailed to patient. Case is being closed at this time.   Plan:  RN Health Coach will close case at this time due to inability to maintain contact with patient.  RN Health Coach will send primary care provider case closure letter.  RN Health Coach will send patient case closure letter.  [place] [phone] [email or web address]

## 2018-09-06 ENCOUNTER — Ambulatory Visit (INDEPENDENT_AMBULATORY_CARE_PROVIDER_SITE_OTHER): Payer: Medicare Other | Admitting: Family Medicine

## 2018-09-06 ENCOUNTER — Other Ambulatory Visit: Payer: Self-pay

## 2018-09-06 ENCOUNTER — Encounter: Payer: Self-pay | Admitting: Family Medicine

## 2018-09-06 VITALS — BP 132/74 | HR 80 | Temp 97.9°F | Ht 64.0 in | Wt 237.4 lb

## 2018-09-06 DIAGNOSIS — E1122 Type 2 diabetes mellitus with diabetic chronic kidney disease: Secondary | ICD-10-CM | POA: Diagnosis not present

## 2018-09-06 DIAGNOSIS — M79671 Pain in right foot: Secondary | ICD-10-CM | POA: Insufficient documentation

## 2018-09-06 DIAGNOSIS — N183 Chronic kidney disease, stage 3 (moderate): Secondary | ICD-10-CM

## 2018-09-06 DIAGNOSIS — R42 Dizziness and giddiness: Secondary | ICD-10-CM | POA: Diagnosis not present

## 2018-09-06 DIAGNOSIS — Z794 Long term (current) use of insulin: Secondary | ICD-10-CM

## 2018-09-06 DIAGNOSIS — M79672 Pain in left foot: Secondary | ICD-10-CM

## 2018-09-06 LAB — POCT GLYCOSYLATED HEMOGLOBIN (HGB A1C): HBA1C, POC (CONTROLLED DIABETIC RANGE): 7.7 % — AB (ref 0.0–7.0)

## 2018-09-06 MED ORDER — DULOXETINE HCL 20 MG PO CPEP: 20.0000 mg | ORAL_CAPSULE | Freq: Every day | ORAL | 3 refills | Status: DC

## 2018-09-06 MED ORDER — SEMAGLUTIDE(0.25 OR 0.5MG/DOS) 2 MG/1.5ML ~~LOC~~ SOPN: 0.5000 mg | PEN_INJECTOR | SUBCUTANEOUS | 1 refills | Status: DC

## 2018-09-06 NOTE — Assessment & Plan Note (Signed)
Since several of patient's symptoms, such as lightheadedness, feeling the room spinning, and orthostasis, belong to different etiologies of dizziness, will have patient record these events in a journal and reexamine this at a future visit.  Recent CBC had a hemoglobin of 11.7, which is not low enough to cause the symptoms.

## 2018-09-06 NOTE — Assessment & Plan Note (Signed)
Given patient's normal foot exam on monofilament testing and history of arthritis as well as achy type pain, suspect this could be more due to osteoarthritis than neuropathy.  Since gabapentin at the highest dose is no longer working for this patient, we will stop this medication and try Cymbalta instead.  Also encouraged patient to reach out to her foot doctor to see if she may benefit from orthotics, since she has used these in the past.

## 2018-09-06 NOTE — Progress Notes (Signed)
Subjective:    Tamara Henry - 57 y.o. female MRN [id number]  Date of birth: May 18, 1961  CC:  Tamara Henry is here for follow up of diabetes.  She would also like to discuss dizziness and foot pain.  HPI: Dizziness - started about three weeks ago - has to stop everything, feels like she doesn't know what she's saying if this occurs while she's talking - sometimes occurs when she stands up, sometimes when she looks up - no nausea - feels like the room is spinning and feels lightheaded - has checked blood sugar during these spells, and it has been normal - no changes in hearing  Foot pain - involves the tips of her toes on both feet - describes pain as an "ache" - not sure if it is from her arthritis or neuropathy - has been going on for about two years and has been getting worse - not positional - currently taking gabapentin, does not think it's working - does not wear high heels  Diabetes - had blood glucoses in the 400's due to steroids for one week - otherwise have ranged from the 120's-130's - during her week with the steroids, she increased her lantus by 10 units - is taking Lantus 50 units nightly and Humalog 5 units twice daily - had hypoglycemic event in early December and November, with blood glucose of 39 and 40  Health Maintenance:  Health Maintenance Due  Topic Date Due  . TETANUS/TDAP  07/07/1982  . COLONOSCOPY  07/07/2013  . OPHTHALMOLOGY EXAM  06/06/2018    -  reports that she quit smoking about 12 years ago. Her smoking use included cigarettes. She has a 20.00 pack-year smoking history. She has never used smokeless tobacco. - Review of Systems: Per HPI. - Past Medical History: Patient Active Problem List   Diagnosis Date Noted  . Dizziness 09/06/2018  . Foot pain, bilateral 09/06/2018  . Acute non intractable tension-type headache 08/06/2018  . Mass 05/20/2018  . Trigger ring finger of left hand 05/20/2018  . Tendonitis of ankle, left 05/14/2018    . Abdominal pain, RUQ   . Acute renal failure superimposed on chronic kidney disease ([place])   . Epigastric pain   . Transaminitis 04/30/2018  . Left hand pain 04/18/2018  . Sprain of ankle 03/25/2018  . Vaginal bleeding 10/09/2017  . Postmenopausal vaginal bleeding 10/04/2017  . Chronic pansinusitis 09/28/2017  . Laryngopharyngeal reflux (LPR) 08/28/2017  . Sensorineural hearing loss (SNHL), bilateral 08/28/2017  . Diarrhea in adult patient 08/04/2017  . Arm pain, anterior, right 06/17/2017  . OSA on CPAP 05/03/2017  . Morbid obesity due to excess calories ([place]) 05/03/2017  . Microscopic hematuria 03/27/2017  . Impingement syndrome of left shoulder 02/19/2017  . Hypersomnia 12/12/2016  . REM behavioral disorder 12/12/2016  . Asthma 12/12/2016  . Neck pain 11/17/2016  . CKD (chronic kidney disease), stage III ([place]) 11/02/2016  . CAD (coronary artery disease), native coronary artery 10/26/2016  . Poor social situation 10/13/2016  . Back pain 08/30/2016  . 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 GERD (gastroesophageal reflux disease) 06/09/2016  . Obesity (BMI 30-39.9) 06/09/2016  . Facial swelling 05/21/2016  . Hyperlipidemia 05/17/2016  . Depression 05/17/2016  . Diabetic peripheral neuropathy ([place]) 05/17/2016  . Neuritis of upper extremity, C7 01/15/2015  . Hypertension 01/14/2015  . Numbness and tingling of right arm 01/14/2015  . Proliferative diabetic retinopathy ([place]) 02/24/2014  . Diabetes mellitus ([place]) 12/18/2013  . Nuclear cataract of both eyes  12/18/2013   - Medications: reviewed and updated   Objective:   Physical Exam BP 132/74   Pulse 80   Temp 97.9 F (36.6 C) (Oral)   Ht 5\' 4"  (1.626 m)   Wt 237 lb 6.4 oz (107.7 kg)   LMP  (LMP Unknown)   SpO2 98%   BMI 40.75 kg/m  Gen: NAD, alert, cooperative with exam, well-appearing, obese CV: RRR, good S1/S2, no murmur Resp: CTABL, no wheezes, non-labored Psych: good insight, alert and oriented  Diabetic Foot Exam -  Simple   Simple Foot Form Visual Inspection No deformities, no ulcerations, no other skin breakdown bilaterally:  Yes Sensation Testing Intact to touch and monofilament testing bilaterally:  Yes Pulse Check Posterior Tibialis and Dorsalis pulse intact bilaterally:  Yes Comments         Assessment & Plan:   Diabetes mellitus ([place]) Patient's A1c today is 7.7, consistent with her last measurement of 7.9 four months ago.  Patient is taking Lantus 50 units and Humalog 5 units twice daily, so her regimen is weighed heavily on the long-acting insulin.  Ideally, we would like to decrease her Lantus and increase her Humalog so that they would be more equal, but given patient's hypoglycemia into the low 40s during the day, I am hesitant to increase her Humalog.  Instead, we will start Ozempic 0.25 mg weekly.  Patient would also benefit from a statin given her history of heart attack, stroke, and diabetes, but her transaminitis in the past has likely precluded this.  We will need to check a CMP and try restarting a statin in the future if possible.  Dizziness Since several of patient's symptoms, such as lightheadedness, feeling the room spinning, and orthostasis, belong to different etiologies of dizziness, will have patient record these events in a journal and reexamine this at a future visit.  Recent CBC had a hemoglobin of 11.7, which is not low enough to cause the symptoms.    Foot pain, bilateral Given patient's normal foot exam on monofilament testing and history of arthritis as well as achy type pain, suspect this could be more due to osteoarthritis than neuropathy.  Since gabapentin at the highest dose is no longer working for this patient, we will stop this medication and try Cymbalta instead.  Also encouraged patient to reach out to her foot doctor to see if she may benefit from orthotics, since she has used these in the past.    Maia Breslow, M.D. 09/06/2018, 3:39 PM PGY-2, [facility]

## 2018-09-06 NOTE — Patient Instructions (Addendum)
It was nice seeing you today Ms. Trainer!  For your dizziness, please make a journal and write out what is happening when your spells occur.  We will talk about this further at your next visit.  For your foot pain, please ask your foot doctor about if they think you would need an orthotic.  Please take Tylenol and Cymbalta for pain.  We can increase your dose of Cymbalta in the future if necessary.  For your diabetes, your A1c was 7.7, which is an improvement from last time.  We will continue your insulin like you have been doing and add Ozempic, a once per week injectable medication.  Please ask your pharmacist to demonstrate how to use this.  We will also need to discuss whether you should start a cholesterol medicine at your next appointment.  If you have any questions or concerns, please feel free to call the clinic.   Be well,  Dr. Shan Levans

## 2018-09-06 NOTE — Assessment & Plan Note (Signed)
Patient's A1c today is 7.7, consistent with her last measurement of 7.9 four months ago.  Patient is taking Lantus 50 units and Humalog 5 units twice daily, so her regimen is weighed heavily on the long-acting insulin.  Ideally, we would like to decrease her Lantus and increase her Humalog so that they would be more equal, but given patient's hypoglycemia into the low 40s during the day, I am hesitant to increase her Humalog.  Instead, we will start Ozempic 0.25 mg weekly.  Patient would also benefit from a statin given her history of heart attack, stroke, and diabetes, but her transaminitis in the past has likely precluded this.  We will need to check a CMP and try restarting a statin in the future if possible.

## 2018-09-13 ENCOUNTER — Other Ambulatory Visit: Payer: Self-pay | Admitting: Family Medicine

## 2018-09-13 DIAGNOSIS — E113513 Type 2 diabetes mellitus with proliferative diabetic retinopathy with macular edema, bilateral: Secondary | ICD-10-CM | POA: Diagnosis not present

## 2018-09-16 ENCOUNTER — Other Ambulatory Visit: Payer: Self-pay

## 2018-09-16 MED ORDER — ALBUTEROL SULFATE HFA 108 (90 BASE) MCG/ACT IN AERS: INHALATION_SPRAY | RESPIRATORY_TRACT | 1 refills | Status: AC

## 2018-09-28 DIAGNOSIS — M1380 Other specified arthritis, unspecified site: Secondary | ICD-10-CM | POA: Diagnosis not present

## 2018-09-28 DIAGNOSIS — E119 Type 2 diabetes mellitus without complications: Secondary | ICD-10-CM | POA: Diagnosis not present

## 2018-09-28 DIAGNOSIS — H409 Unspecified glaucoma: Secondary | ICD-10-CM | POA: Diagnosis not present

## 2018-09-28 DIAGNOSIS — I6930 Unspecified sequelae of cerebral infarction: Secondary | ICD-10-CM | POA: Diagnosis not present

## 2018-09-28 DIAGNOSIS — N182 Chronic kidney disease, stage 2 (mild): Secondary | ICD-10-CM | POA: Diagnosis not present

## 2018-09-30 NOTE — Procedures (Signed)
Lumbar Epidural Steroid Injection - Interlaminar Approach with Fluoroscopic Guidance  Patient: Tamara Henry      Date of Birth: 03-15-1961 MRN: [id number] PCP: Kathrene Alu, MD      Visit Date: 08/15/2018   Universal Protocol:     Consent Given By: the patient  Position: PRONE  Additional Comments: Vital signs were monitored before and after the procedure. Patient was prepped and draped in the usual sterile fashion. The correct patient, procedure, and site was verified.   Injection Procedure Details:  Procedure Site One Meds Administered:  Meds ordered this encounter  Medications  . methylPREDNISolone acetate (DEPO-MEDROL) injection 80 mg     Laterality: Left  Location/Site:  L4-L5  Needle size: 20 G  Needle type: Tuohy  Needle Placement: Paramedian epidural  Findings:   -Comments: Excellent flow of contrast into the epidural space.  Procedure Details: Using a paramedian approach from the side mentioned above, the region overlying the inferior lamina was localized under fluoroscopic visualization and the soft tissues overlying this structure were infiltrated with 4 ml. of 1% Lidocaine without Epinephrine. The Tuohy needle was inserted into the epidural space using a paramedian approach.   The epidural space was localized using loss of resistance along with lateral and bi-planar fluoroscopic views.  After negative aspirate for air, blood, and CSF, a 2 ml. volume of Isovue-250 was injected into the epidural space and the flow of contrast was observed. Radiographs were obtained for documentation purposes.    The injectate was administered into the level noted above.   Additional Comments:  The patient tolerated the procedure well Dressing: Band-Aid    Post-procedure details: Patient was observed during the procedure. Post-procedure instructions were reviewed.  Patient left the clinic in stable condition.

## 2018-09-30 NOTE — Progress Notes (Signed)
Tamara Henry - 58 y.o. female MRN [id number]  Date of birth: December 11, 1960  Office Visit Note: Visit Date: 08/15/2018 PCP: Kathrene Alu, MD Referred by: Kathrene Alu, MD  Subjective: Chief Complaint  Patient presents with  . Lower Back - Pain  . Left Leg - Pain   HPI:  Tamara Henry is a 58 y.o. female who comes in today At the request of Dr. Rodell Perna for left interlaminar epidural steroid injection at L5-S1.  Patient has disc problem with left radicular pain.  She is failed conservative care otherwise.  ROS Otherwise per HPI.  Assessment & Plan: Visit Diagnoses:  1. Lumbar radiculopathy   2. Radiculopathy due to lumbar intervertebral disc disorder     Plan: No additional findings.   Meds & Orders:  Meds ordered this encounter  Medications  . methylPREDNISolone acetate (DEPO-MEDROL) injection 80 mg    Orders Placed This Encounter  Procedures  . XR C-ARM NO REPORT  . Epidural Steroid injection    Follow-up: Return for Rodell Perna, MD.   Procedures: No procedures performed  Lumbar Epidural Steroid Injection - Interlaminar Approach with Fluoroscopic Guidance  Patient: Tamara Henry      Date of Birth: [birth year] MRN: [id number] PCP: Kathrene Alu, MD      Visit Date: 08/15/2018   Universal Protocol:     Consent Given By: the patient  Position: PRONE  Additional Comments: Vital signs were monitored before and after the procedure. Patient was prepped and draped in the usual sterile fashion. The correct patient, procedure, and site was verified.   Injection Procedure Details:  Procedure Site One Meds Administered:  Meds ordered this encounter  Medications  . methylPREDNISolone acetate (DEPO-MEDROL) injection 80 mg     Laterality: Left  Location/Site:  L4-L5  Needle size: 20 G  Needle type: Tuohy  Needle Placement: Paramedian epidural  Findings:   -Comments: Excellent flow of contrast into the epidural space.  Procedure  Details: Using a paramedian approach from the side mentioned above, the region overlying the inferior lamina was localized under fluoroscopic visualization and the soft tissues overlying this structure were infiltrated with 4 ml. of 1% Lidocaine without Epinephrine. The Tuohy needle was inserted into the epidural space using a paramedian approach.   The epidural space was localized using loss of resistance along with lateral and bi-planar fluoroscopic views.  After negative aspirate for air, blood, and CSF, a 2 ml. volume of Isovue-250 was injected into the epidural space and the flow of contrast was observed. Radiographs were obtained for documentation purposes.    The injectate was administered into the level noted above.   Additional Comments:  The patient tolerated the procedure well Dressing: Band-Aid    Post-procedure details: Patient was observed during the procedure. Post-procedure instructions were reviewed.  Patient left the clinic in stable condition.   Clinical History: MRI LUMBAR SPINE WITHOUT CONTRAST  TECHNIQUE: Multiplanar, multisequence MR imaging of the lumbar spine was performed. No intravenous contrast was administered.  COMPARISON:  Radiography 06/06/2017.  MRI 01/13/2015.  FINDINGS: Segmentation:  5 lumbar type vertebral bodies.  Alignment:  Normal  Vertebrae:  Normal  Conus medullaris: Extends to the L1 level and appears normal.  Paraspinal and other soft tissues: Negative  Disc levels:  No abnormality at T12-L1 or L1-2.  L2-3: No disc abnormality. Bilateral facet and ligamentous hypertrophy. No compressive stenosis.  L3-4: Disc degeneration with bilateral foraminal to extraforaminal protrusions. Facet and ligamentous hypertrophy. Mild central canal stenosis. Foraminal encroachment  that would have some potential to affect either or both L3 nerves. This is more pronounced on the left.  L4-5: Disc degeneration with bilateral  foraminal to extraforaminal protrusions. Facet and ligamentous hypertrophy. Mild stenosis of the central canal and lateral recesses. Foraminal to extraforaminal encroachment would have some potential to affect either or both L4 nerves.  L5-S1: Broad-based disc herniation more prominent towards the left. Facet and ligamentous hypertrophy. Narrowing of the subarticular lateral recesses left more than right. Left S1 nerve root compression could occur.  Findings appear similar to the study of May 2016.  IMPRESSION: L2-3:  Facet and ligamentous hypertrophy.  No compressive stenosis.  L3-4: Bilateral foraminal to extraforaminal protrusions more prominent on the left. Facet and ligamentous hypertrophy. Multifactorial stenosis. Potential for compression of the exiting L3 nerves, particularly on the left.  L4-5: Bilateral foraminal to extraforaminal protrusions. Facet and ligamentous hypertrophy. Mild canal and lateral recess stenosis. Potential for compression of the exiting L4 nerves.  L5-S1: Left posterolateral prominent disc herniation. Subarticular lateral recess on the left that could compress the left S1 nerve.   Electronically Signed   By: Nelson Chimes M.D.   On: 06/15/2017 14:21   MRI CERVICAL SPINE WITHOUT CONTRAST 12/07/2016   TECHNIQUE: Multiplanar, multisequence MR imaging of the cervical spine was performed. No intravenous contrast was administered.  COMPARISON: 01/14/2015  FINDINGS: Alignment: Straightening without subluxation  Vertebrae: No fracture, evidence of discitis, or bone lesion.  Cord: Normal signal and morphology.  Posterior Fossa, vertebral arteries, paraspinal tissues: Enlarged nodular thyroid evaluated by sonography 03/25/2015.  Disc levels:  C2-3: Stable and negative for impingement  C3-4: Chronic small central disc protrusion contacting the ventral cord without compression. Patent foramina. Negative facets  C4-5:  Small central disc protrusion contacting the ventral cord. Negative facets. Patent foramina  C5-6: Small central disc protrusion without cord impingement. Negative facets. Patent foramina  C6-7: Mild disc narrowing with broad disc bulging. Mild facet spurring. Patent foramina  C7-T1:Unremarkable.  T2-3: Small disc protrusion without cord impingement.  IMPRESSION: 1. Mild spinal stenosis from C3-4 to C6-7 due to disc bulges and small protrusions. 2. Diffusely patent foramina with mild narrowing on the left at C6-7. 3. T2-3 small disc protrusion without cord impingement.     Objective:  VS:  HT:    WT:   BMI:     BP:125/71  HR:81bpm  TEMP:98 F (36.7 C)(Oral)  RESP:  Physical Exam  Ortho Exam Imaging: No results found.

## 2018-10-01 DIAGNOSIS — I6930 Unspecified sequelae of cerebral infarction: Secondary | ICD-10-CM | POA: Diagnosis not present

## 2018-10-01 DIAGNOSIS — I10 Essential (primary) hypertension: Secondary | ICD-10-CM | POA: Diagnosis not present

## 2018-10-01 DIAGNOSIS — N182 Chronic kidney disease, stage 2 (mild): Secondary | ICD-10-CM | POA: Diagnosis not present

## 2018-10-01 DIAGNOSIS — D509 Iron deficiency anemia, unspecified: Secondary | ICD-10-CM | POA: Diagnosis not present

## 2018-10-01 DIAGNOSIS — R809 Proteinuria, unspecified: Secondary | ICD-10-CM | POA: Diagnosis not present

## 2018-10-01 DIAGNOSIS — E119 Type 2 diabetes mellitus without complications: Secondary | ICD-10-CM | POA: Diagnosis not present

## 2018-10-01 DIAGNOSIS — E782 Mixed hyperlipidemia: Secondary | ICD-10-CM | POA: Diagnosis not present

## 2018-10-01 DIAGNOSIS — E559 Vitamin D deficiency, unspecified: Secondary | ICD-10-CM | POA: Diagnosis not present

## 2018-10-05 DIAGNOSIS — K219 Gastro-esophageal reflux disease without esophagitis: Secondary | ICD-10-CM | POA: Diagnosis not present

## 2018-10-05 DIAGNOSIS — R252 Cramp and spasm: Secondary | ICD-10-CM | POA: Diagnosis not present

## 2018-10-05 DIAGNOSIS — E1165 Type 2 diabetes mellitus with hyperglycemia: Secondary | ICD-10-CM | POA: Diagnosis not present

## 2018-10-05 DIAGNOSIS — I6930 Unspecified sequelae of cerebral infarction: Secondary | ICD-10-CM | POA: Diagnosis not present

## 2018-10-05 DIAGNOSIS — R600 Localized edema: Secondary | ICD-10-CM | POA: Diagnosis not present

## 2018-10-09 ENCOUNTER — Telehealth (INDEPENDENT_AMBULATORY_CARE_PROVIDER_SITE_OTHER): Payer: Self-pay | Admitting: Orthopaedic Surgery

## 2018-10-09 NOTE — Telephone Encounter (Signed)
Left message on patient's voicemail providing her with name and direct number for scheduling surgery for her left hand with Dr. Lorin Mercy.

## 2018-10-10 NOTE — Telephone Encounter (Signed)
noted 

## 2018-10-22 ENCOUNTER — Ambulatory Visit: Payer: Medicare Other | Admitting: Podiatry

## 2018-11-04 ENCOUNTER — Other Ambulatory Visit: Payer: Self-pay | Admitting: Family Medicine

## 2018-12-03 ENCOUNTER — Other Ambulatory Visit: Payer: Self-pay | Admitting: Family Medicine

## 2018-12-03 NOTE — Telephone Encounter (Signed)
Rx came in and there is no PCP listed for this pt.  Dr. Shan Levans was previous PCP.  Rx request is coming from [place]. Will route to Dr. Shan Levans to see if she is still this pts PCP. If not then we can contact pt and let her know that she will need someone else to fill her medication. April Zimmerman Rumple, CMA

## 2018-12-07 ENCOUNTER — Other Ambulatory Visit: Payer: Self-pay | Admitting: Family Medicine

## 2019-01-01 ENCOUNTER — Other Ambulatory Visit: Payer: Self-pay | Admitting: Family Medicine

## 2019-01-30 ENCOUNTER — Other Ambulatory Visit: Payer: Self-pay | Admitting: Family Medicine

## 2019-02-05 ENCOUNTER — Telehealth: Payer: Self-pay | Admitting: *Deleted

## 2019-02-05 NOTE — Telephone Encounter (Signed)
Received request for PA on Ozempic.  Per covermymeds had to call and do PA online.  Once I called they advised that the medication should be process thru medicare first.   Called pharmacy and advised.  They will run thru medicare and let us know if PA is needed.  Christen Bame, CMA

## 2019-02-08 ENCOUNTER — Other Ambulatory Visit: Payer: Self-pay | Admitting: Family Medicine

## 2019-03-20 ENCOUNTER — Other Ambulatory Visit: Payer: Self-pay | Admitting: Family Medicine

## 2019-04-14 ENCOUNTER — Other Ambulatory Visit: Payer: Self-pay | Admitting: Family Medicine

## 2019-04-29 ENCOUNTER — Telehealth: Payer: Self-pay

## 2019-04-29 NOTE — Telephone Encounter (Signed)
Needs follow up visit, no answer when called.

## 2019-05-02 ENCOUNTER — Other Ambulatory Visit: Payer: Self-pay | Admitting: Family Medicine

## 2019-05-08 ENCOUNTER — Other Ambulatory Visit: Payer: Self-pay | Admitting: Family Medicine

## 2019-06-05 ENCOUNTER — Other Ambulatory Visit: Payer: Self-pay | Admitting: Family Medicine

## 2019-06-06 ENCOUNTER — Other Ambulatory Visit: Payer: Self-pay | Admitting: Family Medicine

## 2019-06-14 ENCOUNTER — Other Ambulatory Visit: Payer: Self-pay | Admitting: Family Medicine

## 2019-09-29 ENCOUNTER — Other Ambulatory Visit: Payer: Self-pay | Admitting: Family Medicine

## 2019-12-16 ENCOUNTER — Other Ambulatory Visit: Payer: Self-pay | Admitting: Family Medicine

## 2020-01-14 ENCOUNTER — Other Ambulatory Visit: Payer: Self-pay | Admitting: Family Medicine

## 2020-01-15 NOTE — Telephone Encounter (Signed)
Received another fax from pharmacy requesting a PA on Ozempic. I called pharmacy to clarify. The medication has been approved and a PA is not needed. Advised to stop sending PA requests.

## 2020-01-16 ENCOUNTER — Telehealth: Payer: Self-pay | Admitting: *Deleted

## 2020-01-16 NOTE — Telephone Encounter (Signed)
It looks like Tamara Henry has seen a different office for her diabetes care in 2020.  The last visit she has had with me occurred in December 2019.  Therefore, I will not prescribe any diabetes medications at this time, and we do not need to pursue a PA on Lantus.  I have left a voicemail with her to please call and let us know if she would like to see me regarding her diabetes.

## 2020-01-16 NOTE — Telephone Encounter (Signed)
Received fax from pharmacy, PA needed on Lantus.  See message below and let RN team know if you will change medication or would like to pursue PA:    Cover My Meds info: Key: [id number]  Christen Bame, CMA

## 2020-02-12 ENCOUNTER — Other Ambulatory Visit: Payer: Self-pay | Admitting: Family Medicine

## 2020-02-13 ENCOUNTER — Telehealth: Payer: Self-pay | Admitting: Family Medicine

## 2020-02-13 NOTE — Telephone Encounter (Signed)
Contacted pt to inform her of below and she said that she has a new doctor in [place], I told her to be sure that she informs her pharmacy that Dr. Shan Levans is no longer her doctor. Also, I removed Dr. Shan Levans as her PCP.  Will route to Dr. Shan Levans as an Juluis Rainier.  Cheralyn Oliver Zimmerman Rumple, CMA

## 2020-02-13 NOTE — Telephone Encounter (Signed)
Thank you April!

## 2020-02-13 NOTE — Telephone Encounter (Signed)
Would you call Tamara Henry and schedule an appointment for her to see me in June or her new provider in July?  She has not been seen for well over 1 year.  I will refill a portion of her Lantus to give her time to make this appointment.  Thank you.

## 2020-03-15 ENCOUNTER — Other Ambulatory Visit: Payer: Self-pay | Admitting: Family Medicine

## 2020-04-14 ENCOUNTER — Other Ambulatory Visit: Payer: Self-pay

## 2020-05-11 ENCOUNTER — Telehealth: Payer: Self-pay | Admitting: *Deleted

## 2020-05-11 NOTE — Telephone Encounter (Signed)
LVM to call office back to confirm that pt is still living in [place] and that she is no longer a pt here at our office.  She was removed back in June or July 2021 but somehow she has been placed back with one of our doctors as a PCP. If she is not please remove our PCP and contact pharmacy in [place] and inform them of this.Marland KitchenApril Zimmerman Rumple, CMA

## 2020-05-28 ENCOUNTER — Other Ambulatory Visit: Payer: Self-pay

## 2022-06-06 ENCOUNTER — Other Ambulatory Visit (INDEPENDENT_AMBULATORY_CARE_PROVIDER_SITE_OTHER): Payer: Self-pay | Admitting: Foot & Ankle Surgery

## 2022-06-06 DIAGNOSIS — E119 Type 2 diabetes mellitus without complications: Secondary | ICD-10-CM

## 2022-07-17 ENCOUNTER — Ambulatory Visit (INDEPENDENT_AMBULATORY_CARE_PROVIDER_SITE_OTHER): Payer: Self-pay | Admitting: Foot & Ankle Surgery

## 2022-07-24 ENCOUNTER — Other Ambulatory Visit (INDEPENDENT_AMBULATORY_CARE_PROVIDER_SITE_OTHER): Payer: Self-pay

## 2022-07-24 ENCOUNTER — Other Ambulatory Visit (HOSPITAL_COMMUNITY): Payer: Self-pay

## 2022-07-24 DIAGNOSIS — E042 Nontoxic multinodular goiter: Secondary | ICD-10-CM

## 2022-07-24 DIAGNOSIS — Z95810 Presence of automatic (implantable) cardiac defibrillator: Secondary | ICD-10-CM

## 2022-07-24 DIAGNOSIS — E119 Type 2 diabetes mellitus without complications: Secondary | ICD-10-CM

## 2022-07-24 DIAGNOSIS — Z7689 Persons encountering health services in other specified circumstances: Secondary | ICD-10-CM

## 2022-07-24 DIAGNOSIS — I252 Old myocardial infarction: Secondary | ICD-10-CM

## 2022-08-09 ENCOUNTER — Ambulatory Visit (INDEPENDENT_AMBULATORY_CARE_PROVIDER_SITE_OTHER): Payer: Self-pay | Admitting: Physician Assistant

## 2022-08-10 ENCOUNTER — Ambulatory Visit (INDEPENDENT_AMBULATORY_CARE_PROVIDER_SITE_OTHER): Payer: Medicare PPO | Admitting: Physician Assistant

## 2022-08-11 ENCOUNTER — Other Ambulatory Visit (HOSPITAL_BASED_OUTPATIENT_CLINIC_OR_DEPARTMENT_OTHER): Payer: Medicare PPO

## 2022-08-11 ENCOUNTER — Ambulatory Visit
Payer: Medicare PPO | Attending: Student in an Organized Health Care Education/Training Program | Admitting: Student in an Organized Health Care Education/Training Program

## 2022-08-11 ENCOUNTER — Other Ambulatory Visit: Payer: Self-pay

## 2022-08-11 ENCOUNTER — Encounter (HOSPITAL_BASED_OUTPATIENT_CLINIC_OR_DEPARTMENT_OTHER): Payer: Self-pay | Admitting: Student in an Organized Health Care Education/Training Program

## 2022-08-11 VITALS — BP 128/78 | HR 111 | Ht 64.0 in | Wt 203.0 lb

## 2022-08-11 DIAGNOSIS — E872 Acidosis, unspecified: Secondary | ICD-10-CM | POA: Insufficient documentation

## 2022-08-11 DIAGNOSIS — M899 Disorder of bone, unspecified: Secondary | ICD-10-CM | POA: Insufficient documentation

## 2022-08-11 DIAGNOSIS — N1832 Chronic kidney disease, stage 3b (CMS HCC): Secondary | ICD-10-CM

## 2022-08-11 DIAGNOSIS — E1122 Type 2 diabetes mellitus with diabetic chronic kidney disease: Secondary | ICD-10-CM | POA: Insufficient documentation

## 2022-08-11 DIAGNOSIS — E669 Obesity, unspecified: Secondary | ICD-10-CM | POA: Insufficient documentation

## 2022-08-11 DIAGNOSIS — N2581 Secondary hyperparathyroidism of renal origin: Secondary | ICD-10-CM | POA: Insufficient documentation

## 2022-08-11 DIAGNOSIS — E11319 Type 2 diabetes mellitus with unspecified diabetic retinopathy without macular edema: Secondary | ICD-10-CM

## 2022-08-11 DIAGNOSIS — D631 Anemia in chronic kidney disease: Secondary | ICD-10-CM | POA: Insufficient documentation

## 2022-08-11 DIAGNOSIS — E1142 Type 2 diabetes mellitus with diabetic polyneuropathy: Secondary | ICD-10-CM

## 2022-08-11 DIAGNOSIS — R809 Proteinuria, unspecified: Secondary | ICD-10-CM

## 2022-08-11 DIAGNOSIS — N189 Chronic kidney disease, unspecified: Secondary | ICD-10-CM

## 2022-08-11 DIAGNOSIS — E8770 Fluid overload, unspecified: Secondary | ICD-10-CM | POA: Insufficient documentation

## 2022-08-11 DIAGNOSIS — I129 Hypertensive chronic kidney disease with stage 1 through stage 4 chronic kidney disease, or unspecified chronic kidney disease: Secondary | ICD-10-CM | POA: Insufficient documentation

## 2022-08-11 LAB — PROTEIN/CREATININE RATIO, URINE, RANDOM
CREATININE RANDOM URINE: 99 mg/dL (ref 50–100)
PROTEIN RANDOM URINE: 57 mg/dL
PROTEIN/CREATININE RATIO RANDOM URINE: 576 mg/g — ABNORMAL HIGH (ref 10–105)

## 2022-08-11 LAB — RENAL FUNCTION PANEL
ALBUMIN: 3.5 g/dL (ref 3.5–5.0)
ANION GAP: 8 mmol/L (ref 4–13)
BUN/CREA RATIO: 12 (ref 6–22)
BUN: 24 mg/dL (ref 8–25)
CALCIUM: 9.9 mg/dL (ref 8.6–10.2)
CHLORIDE: 107 mmol/L (ref 96–111)
CO2 TOTAL: 26 mmol/L (ref 22–30)
CREATININE: 1.95 mg/dL — ABNORMAL HIGH (ref 0.60–1.05)
ESTIMATED GFR - FEMALE: 29 mL/min/BSA — ABNORMAL LOW (ref >=60)
GLUCOSE: 138 mg/dL — ABNORMAL HIGH (ref 65–125)
PHOSPHORUS: 3.7 mg/dL (ref 2.4–4.7)
POTASSIUM: 4.5 mmol/L (ref 3.5–5.1)
SODIUM: 141 mmol/L (ref 136–145)

## 2022-08-11 LAB — CBC WITH DIFF
BASOPHIL #: <0.1 10*3/uL (ref <=0.20)
BASOPHIL %: 0 %
EOSINOPHIL #: <0.1 10*3/uL (ref <=0.50)
EOSINOPHIL %: 1 %
HCT: 47.6 % — ABNORMAL HIGH (ref 34.8–46.0)
HGB: 14.8 g/dL (ref 11.5–16.0)
IMMATURE GRANULOCYTE #: <0.1 10*3/uL (ref <0.10)
IMMATURE GRANULOCYTE %: 0 % (ref 0–1)
LYMPHOCYTE #: 2.41 10*3/uL (ref 1.00–4.80)
LYMPHOCYTE %: 32 %
MCH: 28.5 pg (ref 26.0–32.0)
MCHC: 31.1 g/dL (ref 31.0–35.5)
MCV: 91.7 fL (ref 78.0–100.0)
MONOCYTE #: 0.47 10*3/uL (ref 0.20–1.10)
MONOCYTE %: 6 %
MPV: 13 fL — ABNORMAL HIGH (ref 8.7–12.5)
NEUTROPHIL #: 4.62 10*3/uL (ref 1.50–7.70)
NEUTROPHIL %: 61 %
PLATELETS: 179 10*3/uL (ref 150–400)
RBC: 5.19 10*6/uL (ref 3.85–5.22)
RDW-CV: 13.4 % (ref 11.5–15.5)
WBC: 7.6 10*3/uL (ref 3.7–11.0)

## 2022-08-11 LAB — URINALYSIS, MACROSCOPIC
BILIRUBIN: NEGATIVE mg/dL
BLOOD: NEGATIVE mg/dL
COLOR: NORMAL
GLUCOSE: >=1000 mg/dL — AB
KETONES: NEGATIVE mg/dL
LEUKOCYTES: NEGATIVE WBCs/uL
NITRITE: NEGATIVE
PH: 5.5 (ref 5.0–8.0)
PROTEIN: 50 mg/dL — AB
SPECIFIC GRAVITY: 1.013 (ref 1.005–1.030)
UROBILINOGEN: NEGATIVE mg/dL

## 2022-08-11 LAB — MAGNESIUM: MAGNESIUM: 2.1 mg/dL (ref 1.8–2.6)

## 2022-08-11 LAB — MICROALBUMIN/CREATININE RATIO, URINE, RANDOM
CREATININE RANDOM URINE: 99 mg/dL (ref 50–100)
MICROALBUMIN RANDOM URINE: 39.3 mg/dL
MICROALBUMIN/CREATININE RATIO RANDOM URINE: 397 mg/g — ABNORMAL HIGH (ref <30.0)

## 2022-08-11 LAB — URINALYSIS, MICROSCOPIC
RBCS: <1 /hpf (ref <6.0)
WBCS: 2 /hpf (ref <11.0)

## 2022-08-11 LAB — PARATHYROID HORMONE (PTH): PTH: 322.2 pg/mL — ABNORMAL HIGH (ref 8.5–77.0)

## 2022-08-11 LAB — POC BLOOD GLUCOSE (RESULTS): GLUCOSE, POC: 146 mg/dl — ABNORMAL HIGH (ref 70–105)

## 2022-08-11 NOTE — Progress Notes (Signed)
[facility]   Nephrology Initial Office Visit Note    Amber, Larsen T 61 y.o. female  Date of Birth:  Dec 06, 1960  Date of service: 08/11/2022      REASON FOR CONSULT: CKD    REFERRING PHYSICIAN: Jarrett Soho, PA-C      HPI:   Amber Larsen is a 61 y.o. female who is referred to [place] for evaluation and management of CKD.     Patient was initially living in [place], then lives in [place] before recently moving to [facility]. She was following up with nephrology in [facility] and in [place]. Last office visit with Dr. Jomarie Larsen was in 4/23.   Patient has evidence of CKD since at least 2020 but could have been longer (we don't have older records on file). Serum Cr has been stable between 1.5 - 1.8 mg/dl with peak Cr of 2.2 at one time in 4/21 when she had a syncopal event from hypotension. Last available Cr from 4/23 is 1.7 which is at her baseline.     Of note, patient does have a long standing history of poorly controlled DM  c/b neuropathy, retinopathy, (A1c previously used to be 17%, improved now and down to 7-8%). She did not have access to healthcare for several years until recently (few years ago). She also has a long standing h/o HTN, and vascular disease (CAD s/p quadruple bypass in 2017. Recently she presented to the ER after passing out and was found to be hypotensive. Coreg and amlodipine were discontinued. She ws briefly on midodrine but this has been discontinued. She has lost about 30 lbs in the past year, on Ozempic. She is tolerating it well so far.     Today, she denies any new complaints. She feels well. She does not have a way to monitor BP at home, she is in the process of being set up with telehealth for BP monitoring. Today she denies any chest pain, SOB, leg swelling, N/V. She has no history of kidney stones. She denies NSAIDs use.       MEDICAL HISTORY:   Type 2 DM since at least 40 years , poorly controlled for several years (recently A1c has been in 7-8%) c/b retinopathy, neuropathy.   HTN since at  least 30 years, poorly controlled for several years   Multivessel CAD s/p 4-vessel CABG in 2017, f/b failed grafts subsequently placed cardiac defibrillator in 2020   Bilateral cataracts s/p extraction   Glaucoma   Generalized anxiety disorder   HLD   Obesity   Non toxic multinodular goiter   Major depression   GERD on chronic PPI   Pulmonary embolism (in 2017 and in 2022)- unclear reason       SURGICAL HISTORY:   Tubal ligation   4-vessel CABG in 2017   Rotator cuff repair       ALLERGIES:  Allergies   Allergen Reactions    Metformin Mental Status Effect    Sulfa (Sulfonamides)     Latex Itching         MEDICATIONS:     Current Outpatient Medications:     amitriptyline (ELAVIL) 10 mg Oral Tablet, Take 1 Tablet (10 mg total) by mouth Every night, Disp: , Rfl:     aspirin (ECOTRIN) 81 mg Oral Tablet, Delayed Release (E.C.), Take 1 Tablet (81 mg total) by mouth, Disp: , Rfl:     atorvastatin (LIPITOR) 20 mg Oral Tablet, Take 1 Tablet (20 mg total) by mouth Twice daily, Disp: ,  Rfl:     Bifidobacterium Infantis (ALIGN) 4 mg Oral Capsule, Take 1 Capsule (4 mg total) by mouth, Disp: , Rfl:     bumetanide (BUMEX) 1 mg Oral Tablet, Take 1 Tablet (1 mg total) by mouth Twice daily, Disp: , Rfl:     cholecalciferol, vitamin D3, 25 mcg (1,000 unit) Oral Tablet, Take 1 Tablet (1,000 Units total) by mouth, Disp: , Rfl:     citalopram (CELEXA) 40 mg Oral Tablet, , Disp: , Rfl:     clobetasoL (TEMOVATE) 0.05 % Cream, , Disp: , Rfl:     DULoxetine (CYMBALTA DR) 20 mg Oral Capsule, Delayed Release(E.C.), Take 1 Capsule (20 mg total) by mouth, Disp: , Rfl:     ergocalciferol, vitamin D2, (DRISDOL) 1,250 mcg (50,000 unit) Oral Capsule, Take 1 Capsule (50,000 Units total) by mouth, Disp: , Rfl:     ezetimibe (ZETIA) 10 mg Oral Tablet, Take 1 Tablet (10 mg total) by mouth Once a day, Disp: , Rfl:     famotidine (PEPCID) 40 mg Oral Tablet, , Disp: , Rfl:     isosorbide mononitrate (IMDUR) 30 mg Oral Tablet Sustained Release 24 hr, ,  Disp: , Rfl:     JARDIANCE 25 mg Oral Tablet, , Disp: , Rfl:     olmesartan (BENICAR) 40 mg Oral Tablet, Take 1 Tablet (40 mg total) by mouth Once a day, Disp: , Rfl:     omeprazole (PRILOSEC) 40 mg Oral Capsule, Delayed Release(E.C.), Take 1 Capsule (40 mg total) by mouth, Disp: , Rfl:     OZEMPIC 0.25 mg or 0.5 mg (2 mg/3 mL) Subcutaneous Pen Injector, , Disp: , Rfl:     prasugreL (EFFIENT) 10 mg Oral Tablet, , Disp: , Rfl:     sodium bicarbonate 650 mg Oral Tablet, Take by mouth Every 12 hours, Disp: , Rfl:     VASCEPA 1 gram Oral Capsule, Take 2 Capsules (2 g total) by mouth, Disp: , Rfl:       SOCIAL HISTORY:   Lives by herself. Independent in ADLs.   Occupation: currently unemployed.   Alcohol use: none   Tobacco use: none  Illicit drug use: none       FAMILY HISTORY:   Brother has ESRD on dialysis, likely from diabetes.   Patient's mother and father had DM and cardiovascular disease.       REVIEW OF SYSTEMS:   All other systems are negative      PHYSICAL EXAM  Vitals:    08/11/22 1111   BP: 128/78   Pulse: (!) 111   SpO2: 93%   Weight: 92.1 kg (203 lb 0.7 oz)   Height: 1.626 m (5\' 4" )   BMI: 34.93       GENERAL: A very pleasant woman in no apparent distress  HEENT: Sclerae anicteric. Mucus membranes pink and moist. Oropharynx clear.   HEART: Regular rhythm and rate, S1 S2 normal, no murmurs, rub or gallop.   LUNGS: clear to auscultation bilaterally  ABDOMEN: Soft, non tender, non distended, bowel sounds normal. No hepatosplenomegaly or masses. No abdominal bruits.   EXTREMITIES: No edema   SKIN: No rash   NEURO: Alert and oriented x 3. No focal motor deficits.   PSYCH: Pleasant, cooperative, in no apparent distress  MSK: No joint swelling or tenderness.       LABS  I have reviewed the following labs.    Labs from care everywhere: (last nephrology note from Dr. Jomarie LongsJoseph, 4/23)  IMAGING:     CT abd pelvis: 03/18/22:             ASSESSMENT AND PLAN:    Chronic kidney disease, stage 3, likely secondary to  long standing poorly controlled DM:  Evidence of CKD since at least 2020 but likely has been longer (we don't have previous records).   B/l Cr 1.5 -1.8. Most recent serum Cr was 1.7 in 4/23.   Etiology of CKD is most likely long standing poorly controlled DM (given that she does have proteinuria), long standing HTN. Other risk factors and Ddx include obesity associated CKD and possibly FSGS.   We discussed importance of risk factor management in order to delay progression of kidney disease.   We will repeat labs today. We will monitor renal function.   Obtain renal US.     2. Hypertension:   BP at goal in office today. She is in the process of being set up with telehealth for BP monitoring. Given recent episode of syncope from hypotension, amlodipine and coreg (12.5 BID) were discontinued.   Currently she is on Olmesartan 40 mg daily.   We will not make any changes today.     3. Sub-nephrotic range Proteinuria likely secondary to diabetes:   She has evidence of proteinuria since at least 2020 but could have been longer. UPC has mostly ranged between 0.2 - 0.5 gm/gm Cr.   Most likely 2/2 DM   We will repeat UPC, ACR today. She is on SGLT2i. We will not make any changes today.     4. Anemia of chronic kidney disease:   Most recent available Hb (on records) is 12, WNL from 8/22.   We will repeat CBC today and if indicated will obtain iron studies.     5. Secondary hyperparathyroidism:   Vit D was 38 in 3/22 and iPTH was 66 in 8/22.   We will obtain labs today and repeat iPTH and vit D today. She is on calcium and vit D supplementation. We will not make any changes today.     6. Volume overload/Edema:   She appears euvolemic today. She will continue bumex 1 mg BID.     7. Metabolic acidosis:   Most recent serum bicarb level was 17 in 4/23. She is currently on PO bicarb TID, unclear of the dose. Based on last nephrology note, PO bicarb was increased to 1300 TID.   We will repeat labs today and reassess.          DISPOSITION: The patient will RTC in 4 months  or earlier if indicated. In the interim, we will communicate with her over the phone regarding the results of above mentioned tests. She lives in [place], we will try to arrange future visits at [facility] CKD clinic (after her first FU appointment).     The patient was seen independently.       Orders Placed This Encounter    US KIDNEY                      PARATHYROID HORMONE (PTH)    VITAMIN D 25 TOTAL    CBC/DIFF    URINALYSIS, MACROSCOPIC AND MICROSCOPIC NO CULTURE REFLEX    MICROALBUMIN/CREATININE RATIO, URINE, RANDOM    PROTEIN/CREATININE RATIO, URINE, RANDOM    RENAL FUNCTION PANEL    MAGNESIUM    RENAL FUNCTION PANEL    CBC/DIFF    URINALYSIS, MACROSCOPIC AND MICROSCOPIC W/CULTURE REFLEX    PROTEIN/CREATININE RATIO, URINE,  RANDOM    MICROALBUMIN/CREATININE RATIO, URINE, RANDOM        Pennie Banter, MD   Assistant Professor  Department of Medicine, Section of Nephrology   [facility]

## 2022-08-11 NOTE — Patient Instructions (Signed)
Alternate Pepcid with omeprazole and eventually come off omeprazole   Monitor home blood pressures   Labs today   Follow up in 4 months, labs prior to visit. Come early for next appointment

## 2022-08-11 NOTE — Nursing Note (Signed)
I took patient's blood sugar because patient stated she felt dizzy and didn't eat anything. Patient's blood sugar was 146. Moss Mc, MA

## 2022-08-14 ENCOUNTER — Ambulatory Visit (HOSPITAL_BASED_OUTPATIENT_CLINIC_OR_DEPARTMENT_OTHER): Payer: Self-pay | Admitting: Student in an Organized Health Care Education/Training Program

## 2022-08-14 LAB — VITAMIN D 25 TOTAL: VITAMIN D, 25OH: 45 ng/mL (ref 30–100)

## 2022-08-14 NOTE — Telephone Encounter (Signed)
Regarding: lab orders needing faxed  ----- Message from Lenice Llamas sent at 08/11/2022  4:29 PM EST -----  Dr Carlus Pavlov,    Pt calling stating she can have labs done at her PCP which is very close to her. The office is Dr Brock Bad at the [facility]. Phone # [phone]. Pt does not have fax number. Thank you!

## 2022-08-14 NOTE — Telephone Encounter (Signed)
Lab orders from Dr. Carlus Pavlov printed and faxed to patient's PCP per request  Elmon Kirschner, RN

## 2022-08-15 ENCOUNTER — Ambulatory Visit (HOSPITAL_BASED_OUTPATIENT_CLINIC_OR_DEPARTMENT_OTHER): Payer: Self-pay | Admitting: Student in an Organized Health Care Education/Training Program

## 2022-08-15 NOTE — Telephone Encounter (Signed)
Regarding: order  ----- Message from Mearl Latin sent at 08/15/2022  3:17 PM EST -----   [name], MD    Pt is asking for a order to be sent for a kidney ultra sound . Please send to [facility].

## 2022-08-16 ENCOUNTER — Encounter (INDEPENDENT_AMBULATORY_CARE_PROVIDER_SITE_OTHER): Payer: Self-pay

## 2022-08-16 NOTE — Telephone Encounter (Signed)
Hickman, Evon Slack, Stark Jock, RN  Caller: Unspecified (Yesterday,  3:16 PM)  Yes, I spoke with pt yesterday and faxed everything to them.  I refaxed again this morning, just in case something happen with them.  Bev        Elmon Kirschner, RN

## 2022-08-18 ENCOUNTER — Ambulatory Visit (INDEPENDENT_AMBULATORY_CARE_PROVIDER_SITE_OTHER): Payer: Medicare PPO | Admitting: Physician Assistant

## 2022-08-21 NOTE — Result Encounter Note (Signed)
Attempted to call patient to discuss results of labs. Unable to reach her.     Elmyra Ricks,   Can you please let her know that renal function is stable. Minimal protein in urine. Labs overall look stable, no new changes.     Thanks   [name], MD

## 2022-10-17 NOTE — H&P (Addendum)
Patient: Margaurite Salido Date: 10/17/2022  DOB: 27-May-1961 Admission Date: 10/17/2022  MRN: [id number] Attending: Matthias Hughs, MD  CODE STATUS: Full Code  PRIMARY CARE MD: Heloise Purpura, MD    Chief Complaint:   Chief Complaint   Patient presents with   . Syncope     Pt initally called EMS for hyperglycemia. Pt has a dexcom and it was dead and EMS observed pt giving 20 units of insulin and ate a snack. Pt then called ems for a sycnopal episode. Dexcom reading 171 and trending down. Per family hx of htn and her pcp took her off as she was becoming hypotensive and having syncopal episodes. Pt unsure if she took her BP medicine in a mixup or not.        History Gathered From: patient and relative(s)    History of Present Illness    Donda Friedli is a 62 y.o. female with a PMHx of  type 2 diabetes mellitus, stage III CKD, CAD status post 2 stents in past on dual antiplatelets, CHF with last known EF 45% in July 2023 presented to emergency room today for hyperglycemia and syncope, as per patient stated that she passed out while checking her sugar today, denies any chest pain shortness for breath prior or after loss of consciousness, further workup revealed acute kidney injury, low blood pressure and ER referred for admission for further workup and management.  Per reports, EMS noted sugar of 590 f/b 20 U insulin pt took her self and sugar improved, in ER sugar 79.   Patient and patient's family stating that patient had syncope in past due to low blood pressure as well and recently she was taken off of blood pressure medications however today patient might have mixed up and may have taken blood pressure medication as per patient.    Past Medical History:   Diagnosis Date   . Arthritis    . Asthma    . Bursitis of shoulder    . Cataracts, bilateral    . Chronic kidney disease    . Clotting disorder (CMS-HCC)    . Coronary artery disease    . CTS (carpal tunnel syndrome)    . Diabetes mellitus (CMS-HCC)    .  Glaucoma    . Hypertension    . Neuropathy    . NSTEMI (non-ST elevated myocardial infarction) (CMS-HCC) 03/04/2019   . Pulmonary embolism (CMS-HCC)    . Stage 2 chronic kidney disease 03/04/2019       Past Surgical History:   Procedure Laterality Date   . ANTERIOR CRUCIATE LIGAMENT REPAIR     . ROTATOR CUFF REPAIR         (Not in a hospital admission)      Family History   Problem Relation Age of Onset   . Diabetes Brother         MyBrothe mother son granddauvte       Social History     Tobacco Use   . Smoking status: Former     Current packs/day: 0.00     Average packs/day: 2.0 packs/day for 20.0 years (40.0 ttl pk-yrs)     Types: Cigarettes     Start date: 60     Quit date: 2001     Years since quitting: 23.1   . Smokeless tobacco: Never   Vaping Use   . Vaping status: Never Used   Substance Use Topics   . Alcohol use: Not Currently   . Drug use:  Not Currently     Types: "Crack" cocaine       REVIEW OF SYSTEMS    Review of Systems   Constitutional: Negative for diaphoresis and fever.   HENT: Negative for congestion, ear pain, rhinorrhea and trouble swallowing.    Eyes: Negative for pain and discharge.   Respiratory: Negative for apnea, choking and shortness of breath.    Cardiovascular: Negative for chest pain and palpitations.  Positive for syncope.  Gastrointestinal: Negative for abdominal distention, abdominal pain, diarrhea, nausea and vomiting.      Neurological: Negative for dizziness, syncope and facial asymmetry.   Hematological: Does not bruise/bleed easily.   Psychiatric/Behavioral: Negative for agitation and hallucinations.               Vital Signs (most recent): Visit Vitals  BP 98/64 (BP Location: Right upper arm, Patient Position: Lying)   Pulse 80   Temp 98.9 F (37.2 C) (Oral)   Resp 16   Ht 5\' 4"  (1.626 m)   Wt 210 lb (95.3 kg)   SpO2 98%   BMI 36.05 kg/m   OB Status Postmenopausal   Smoking Status Former   BSA 2.07 m        PHYSICAL EXAM    Constitutional: oriented to person, place, and  time. No distress.   HENT:   Head: Normocephalic and atraumatic.   Eyes: Pupils are equal, round, and reactive to light.   Neck: Neck supple.   Cardiovascular: Normal rate, regular rhythm and normal heart sounds.     Pulmonary/Chest: Effort normal and breath sounds normal. No stridor.    Abdominal: Soft. Bowel sounds are normal. There is no tenderness.   Musculoskeletal: Normal range of motion.  no edema.   Neurological: alert and oriented to person, place, and time.  Normal power all 4 extremities.     Psychiatric:  normal mood and affect.                 LABS & IMAGING    Recent Results (from the past 24 hour(s))   ECG 12 lead    Collection Time: 10/17/22  6:23 PM   Result Value Ref Range    PR Interval 151 ms    QRSD Interval 113 ms    QT Interval 462 ms    QTcB 544 ms    QTcF 515 ms    QRS Horizontal Axis -47 deg    QRS Axis 12 deg   CBC auto differential    Collection Time: 10/17/22  6:32 PM   Result Value Ref Range    WBC 8.42 4.00 - 11.00 K/uL    RBC 4.48 3.80 - 5.00 M/uL    Hemoglobin 13.3 11.0 - 15.0 g/dL    Hematocrit 41 35 - 45 %    MCV 92 81 - 99 fL    MCH 30 28 - 32 pg    MCHC 32 32 - 36 g/dL    Platelets 865180 784130 - 400 K/uL    RDW-SD 48.0 (H) 35.0 - 46.0 fL    RDW-CV 14.2 11.0 - 15.0 %    MPV      nRBC 0 <=0 /100 WBCs    Differential Type Manual     Morphology Review PERFORMED    Comprehensive metabolic panel    Collection Time: 10/17/22  6:32 PM   Result Value Ref Range    Glucose 79 65 - 105 mg/dL    BUN 32 (H) 7 - 22 mg/dL  Creatinine 2.0 (H) 0.7 - 1.2 mg/dL    Sodium 585 929 - 244 mmol/L    Potassium 3.1 (L) 3.5 - 5.3 mmol/L    Chloride 106 98 - 107 mmol/L    CO2 25 22 - 30 mmol/L    Calcium 8.6 8.4 - 10.2 mg/dL    Total Protein 6.3 6.3 - 8.2 g/dL    Albumin 3.3 (L) 3.5 - 5.0 g/dL    AST 20 14 - 36 U/L    Alkaline Phosphatase 137 (H) 38 - 126 U/L    ALT (SGPT) 15 0 - 34 U/L    Total Bilirubin 0.5 0.2 - 1.3 mg/dL    eGFR 28 (L) >62 MM/NOT/7.71H*6    Anion Gap (K+ excluded) 6 3 - 12 mmol/L    Troponin I    Collection Time: 10/17/22  6:32 PM   Result Value Ref Range    Troponin I 0.035 (H) 0.000 - 0.033 ng/mL   Magnesium    Collection Time: 10/17/22  6:32 PM   Result Value Ref Range    Magnesium 1.8 1.6 - 2.3 mg/dL   SARS COV-2 + Influenza A/B Antigen    Collection Time: 10/17/22  6:32 PM    Specimen: Nasal Swab   Result Value Ref Range    Flu A Antigen Ag, FIA Negative Negative    Flu B Antigen Ag, FIA Negative Negative    SARs CoV2 Antigen, FIA Presumptive Negative Presumptive Negative, Invalid   N-Terminal Probnp    Collection Time: 10/17/22  6:32 PM   Result Value Ref Range    NT-proBNP 3,140 (H) 0 - 900 pg/mL   MANUAL DIFF AND RBC MORPHOLOGY    Collection Time: 10/17/22  6:32 PM   Result Value Ref Range    Neutrophils % 58 46 - 78 %    Lymphocytes % 33 15 - 45 %    Monocytes % 5 3 - 10 %    Eosinophils % 1 0 - 6 %    Basophils % 1 0 - 3 %    Atypical Lymphocytes % 2 %    Absolute Neutrophil Count 4.88 1.60 - 7.50 K/uL    Lymphocytes Absolute 2.95 0.60 - 4.50 K/uL    Monocytes Absolute 0.42 0.10 - 1.20 K/uL    Eosinophils Absolute 0.08 0.00 - 0.60 K/uL    Basophils Absolute 0.08 0.00 - 0.20 K/uL    Vacuolated Neutrophils PRESENT     RBC Morphology PERFORMED      Anisocytosis 1+     Macrocytes 1+     Large Platelets PRESENT     Differential Type Manual     Morphology Review PERFORMED          IMAGING:    Upon my review: X-ray Chest 1 View  Narrative: CLINICAL INDICATION: Shortness of breath..    TECHNIQUE:  AP chest radiograph.    FINDINGS:    The heart size is within normal limits for an AP radiograph.  The mediastinal contours are unremarkable.  The lungs are clear, and the costophrenic angles are sharp.  The osseous structures of the thorax are intact.    Left subclavian cardiac pacer lead directed right ventricle. There is evidence of prior median sternotomy and CABG.  Impression: No radiographic evidence of acute cardiopulmonary disease.    Dictated: Belenda Cruise, MD On: 10/17/2022 7:18  PM  Electronically signed by: Belenda Cruise, MD On: 10/17/2022 7:19 PM  Transcribed:  CT Head wo IV Contrast  Narrative:  Clinical indication: Syncope.    Technique:  Noncontrast axial CT images are acquired through the brain.    Findings:    There are no abnormal densities within the cerebral hemispheres, brainstem, or cerebellum.  There are no intra- or extra-axial masses or fluid collections.  The ventricles, sulci, and cisterns are normal in size, shape, and position for age.    The calvarium is unremarkable. The visualized paranasal sinuses are well pneumatized. The mastoid air cells and middle ear cavities are well pneumatized.    There are atherosclerotic calcifications of the intracranial segments of the right vertebral artery and the bilateral internal carotid arteries.    There is been no significant change compared with 10/31/2021 head CT.  Impression: 1. No acute intracranial hemorrhage, midline shift, or mass effect.  2. No CT evidence of acute intracranial abnormality.    Dictated: Belenda CruiseJames Schuster, MD On: 10/17/2022 6:56 PM  Electronically signed by: Belenda CruiseJames Schuster, MD On: 10/17/2022 7:03 PM  Transcribed:      CARDIAC:  Normal sinus rhythm.    EKG Interpretation (upon my review):   Encounter Date: 10/17/22   ECG 12 lead   Result Value    PR Interval 151    QRSD Interval 113    QT Interval 462    QTcB 544    QTcF 515    QRS Horizontal Axis -47    QRS Axis 12    Impression    Sinus rhythm  normal P axis, V-rate 50-99  Probable left atrial enlargement  P >7250mS, <-0.9810mV V1  Borderline intraventricular conduction delay  QRSd >1212mS  Abnormal T, consider ischemia, diffuse leads  T <-0.6720mV, ant/lat/inf  Prolonged QT interval  QTc >55310mS       ASSESSMENT & PLAN    Abbe Amsterdamngela Tanita Antigua is a 62 y.o. female admitted under   with Syncope, unspecified syncope type.    Patient Active Problem List   Diagnosis   . Diarrhea with dehydration   . Acute kidney injury superimposed on CKD (CMS-HCC)    . Type 2 diabetes mellitus  without complications (CMS-HCC)   . Syncope   . Injury of head, initial encounter   . Syncope, unspecified syncope type     1. Syncope:  Hypotension, arrhythmia versus other etiology, blood pressure now improved with IV fluid, continue for now, telemetry monitoring, Cardiology consult.  2. History of CAD status post stent:  No chest pain, initial troponin not significant, telemetry monitoring, will trend troponins.  Continue dual antiplatelet and statin.      3.  History of systolic CHF: At this time no sign of exacerbation, I think patient may benefit from IV fluid more at this time.    Monitor respiratory status closely currently on room air.    4.  Hypotension: Hold blood pressure medication accept Coreg for now, blood pressure appears improving already.  No evidence of infection.    5. Type 2 diabetes mellitus: will get A1C, unclear details about today events, last sugar in 70s, will cover w sliding scale for now, adjust as appropriate.  6.AKI on stage 3CKD: ? Low BP, IVF<monitor Cr.  7.HLD:statin.    8.NSVT: noted in ER, started on amio drip for now given her cardiac history and has AICD in place as well, AICD interrogation in AM.  Monitor K,Mg replace,monitor, tele.  DVT/VTE Prophylaxis: as ordered.        Mehulkumar Ree Kidaoopareliya, MD, MD   Hospitalist  Internal Medicine    10/17/2022 8:18 PM

## 2022-10-18 NOTE — Consults (Signed)
-------------------------------------------------------------------------------  Attestation signed by Amber Polka, MD at 10/18/2022 11:21 AM  Patient was seen by [facility] providers. I was directly involved in patient's care today. Plan of care was formulated during clinical rounds. I concur with the history, assessment and plan outlined in the advanced practice provider's note.  Agree with ICD check and medical therapy   -------------------------------------------------------------------------------        CARDIOLOGY NOTE     10/18/2022  Amber Larsen  [birth year]  [id number]    PRIMARY CARDIOLOGIST:  Amber Larsen    PROBLEM LIST/ASSESSMENT:   Syncope (R55) - in the setting of hypotension, on multiple agents for ICM/GDMT, need adjustments.  She reports some of her medications were discontinued due to hypotension but she has been taking all by mistake. Also dehydration evident on labs.    NSTEMI (I21.4) - likely demand ischemia in the setting of above, no chest pain.  Recent stress test 03/2022 abnormal at [facility] as noted below.    CAD with prior history of 4 vessel CABG in 2017 followed by PCI to OM branch in 02/2019 and 80% stenosis lad to diagonal branch, recommended medical treatment. - on baby aspirin, Effient, carvedilol, Imdur, olmesartan and statin    Ischemic cardiomyopathy with most recent EF 45-50%- s/p ICD in 2021 - on heart failure symptoms. On proper GDMT    Hypertension (I10) - hypotension with low normal BP this am    AKI on CKD with baseline creatinine 1.4- 1.6 previous records, Cr 2.0    PLAN:  Device interrogation (Biotronik) rep coming at 2 pm  Continue baby aspirin, Effient 10 mg p.o. daily, atorvastatin.  Decrease carvedilol to low-dose 3.125 mg p.o. twice daily, discontinue Imdur  When blood pressure improved, resume olmesartan 20 mg p.o. daily (Home dose 40 mg )  Further recommendations pending interrogation  Adequate oral hydration discussed, patient has not been eating well or drinking fluids  due to recent stress (Son died at [facility] post heart transplant, has been taking care of him for 6 months) Lives in [place] now and follows there.      CARDIAC DIAGNOSTIC TESTING     EKG:  Sinus rhythm with diffuse ST-T changes    TELEMETRY:  Sinus rhythm    2D ECHO: 7//05/2022  Interpretation Summary   Technically suboptimal study quality may adversely affect interpretation.   Left ventricular systolic function is mildly reduced.   LV ejection fraction = 45-50%.   The inferior wall is hypokinetic.   The septal wall is hypokinetic.   The right ventricular systolic function is mild to moderately reduced.   There is mild mitral regurgitation.   There is mild tricuspid regurgitation.     STRESS TESTING: 03/20/2022  IMPRESSION / COMMENT:  Abnormal regadenoson stress MPI. Findings are consistent with moderate areas of myocardial ischemia in the anterior and lateral areas. Normal left ventricular size with moderately depressed systolic function.     CARDIAC CATHETERIZATION: 02/2019  Conclusion:  1.  SVG to OM, SVG to PDA, SVG to diagonal branch totally occluded.  Known from previous angiogram.  2.  LIMA to LAD is patent known from previous angiogram  3.  80% stenosis LAD to diagonal branch.  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  4.  100% occluded stent in OM1, the stent was implanted within the last 30 days.  Not very sure is is because of aggressive native disease or she did thrombose her stents.  5.  Balloon angioplasty and dilation of the OM1 stent establishing TIMI 3 flow in the entire vessel.  6.  At this time would recommend medical management, I am not very sure putting more stents will be beneficial in her case or not because the stents appear to be occluding very rapidly.    EP PROCEDURES: 12/2019   Implantation of a single-chamber implantable cardioverter-defibrillator with defibrillation threshold testing.              CHIEF COMPLAINT:  Chief Complaint   Patient  presents with   . Syncope     Pt initally called EMS for hyperglycemia. Pt has a dexcom and it was dead and EMS observed pt giving 20 units of insulin and ate a snack. Pt then called ems for a sycnopal episode. Dexcom reading 171 and trending down. Per family hx of htn and her pcp took her off as she was becoming hypotensive and having syncopal episodes. Pt unsure if she took her BP medicine in a mixup or not.        REASON FOR REFERRAL:  Syncope  REFERRING PHYSICIAN:  Dr Amber Larsen    HPI:   Amber Larsen is a 62 y.o. female with past medical history of 4 vessel CABG in 2017 with AICD placement in 2020, HTN, HLD, DM and CKD who presented to [facility] yesterday for evaluation of syncopal episode.  Patient reports recently in significant stress due to her son died and his funeral is on 2023-02-02.  She states has been taking care of her son for the past 6 month he was in [facility] for heart transplant which was successful then multiple admission due to rejection followed by decompensation and death.  She reports she has lost weight as well on Ozempic and her medications were cut down, but she has been taking all the medications without thinking and yesterday she passed out for few minutes and brought to the hospital for further evaluation.  She also reports has not been drinking and eating well for the past few days.  Denies any cardiac symptoms at the time of exam.  No chest pain or evidence of volume overload on exam.    Past Medical History:   Diagnosis Date   . Arthritis    . Asthma    . Bursitis of shoulder    . Cataracts, bilateral    . Chronic kidney disease    . Clotting disorder (CMS-HCC)    . Coronary artery disease    . CTS (carpal tunnel syndrome)    . Diabetes mellitus (CMS-HCC)    . Glaucoma    . Hypertension    . Neuropathy    . NSTEMI (non-ST elevated myocardial infarction) (CMS-HCC) 03/04/2019   . Pulmonary embolism (CMS-HCC)    . Stage 2 chronic kidney disease 03/04/2019     Past Surgical  History:   Procedure Laterality Date   . ANTERIOR CRUCIATE LIGAMENT REPAIR     . ROTATOR CUFF REPAIR       Patient Active Problem List    Diagnosis Date Noted   . Syncope, unspecified syncope type 10/17/2022   . Injury of head, initial encounter 10/31/2021   . Syncope 05/14/2020   . Acute kidney injury superimposed on CKD (CMS-HCC)  07/23/2019   . Type 2 diabetes mellitus without complications (CMS-HCC) 07/23/2019   . Diarrhea with dehydration 03/03/2019       HOME MEDS:  Medications Prior to Admission   Medication Sig Dispense Refill  Last Dose   . acetaminophen (Tylenol) 325 mg tablet Take 2 tablets (650 mg total) by mouth every 8 (eight) hours if needed for mild pain or fever.      Marland Kitchen. albuterol HFA (Proventil HFA) 90 mcg/actuation inhaler Inhale 1 puff 1 (one) time each day.      Marland Kitchen. aspirin 81 mg chewable tablet Chew 81 mg 1 (one) time each day.      Marland Kitchen. atorvastatin (Lipitor) 20 mg tablet Take 1 tablet (20 mg total) by mouth every night. 30 tablet 0    . carvediloL (Coreg) 12.5 mg tablet Take 12.5 mg by mouth 1 (one) time each day.      . cyclobenzaprine (Flexeril) 10 mg tablet Take 1 tablet (10 mg total) by mouth 2 (two) times a day if needed for muscle spasms. 30 tablet 0    . ergocalciferol (Vitamin D-2) 1,250 mcg (50,000 unit) capsule Take 50,000 Units by mouth 1 (one) time per week. TAKES EVERY MONDAY      . ezetimibe (Zetia) 10 mg tablet Take 10 mg by mouth 1 (one) time each day.      . fluticasone HFA (Flovent HFA) 110 mcg/actuation inhaler Inhale 1 puff 1 (one) time each day. Rinse mouth with water after use to reduce aftertaste and incidence of candidiasis. Do not swallow.      Marland Kitchen. icosapent ethyL (Vascepa) 1 gram capsule Take 2 g by mouth 2 (two) times a day.      . insulin glargine (Lantus) 100 unit/mL injection Inject 50 Units under the skin every night.      . insulin lispro (HumaLOG) 100 unit/mL injection Inject 5 Units under the skin 2 (two) times a day before meals.      . isosorbide mononitrate (Imdur)  30 mg 24 hr tablet Take 1 tablet (30 mg total) by mouth 1 (one) time each day. Do not crush or chew. 30 tablet 0    . nitroGLYCERIN (Nitrostat) 0.4 mg SL tablet Place 0.4 mg under the tongue every 5 (five) minutes if needed for chest pain.      Marland Kitchen. olmesartan (Benicar) 40 mg tablet Take 1 tablet by mouth 1 (one) time each day.      Marland Kitchen. omeprazole (PriLOSEC) 40 mg DR capsule Take 40 mg by mouth 2 (two) times a day. Do not crush or chew.      . prasugreL (Effient) 10 mg tablet       . semaglutide (Ozempic) 0.25 mg or 0.5 mg(2 mg/1.5 mL) pen injector Inject 0.25 mg under the skin every 7 (seven) days. TAKEN EVERY MONDAY      . sodium bicarbonate 650 mg tablet Take by mouth every 12 (twelve) hours.          HOSPITAL MEDS:  aspirin, 81 mg, Daily  atorvastatin, 20 mg, Nightly  carvediloL, 6.25 mg, BID with meals  enoxaparin, 40 mg, Daily  ezetimibe, 10 mg, Daily  insulin lispro, 1-9 Units, TID AC  prasugreL, 10 mg, Daily  sodium bicarbonate, 650 mg, BID  sodium chloride 0.9 %, 3 mL, BID        SOCIAL HISTORY:   reports that she quit smoking about 23 years ago. Her smoking use included cigarettes. She started smoking about 43 years ago. She has a 40.0 pack-year smoking history. She has never used smokeless tobacco. She reports that she does not currently use alcohol. She reports that she does not currently use drugs after having used the following drugs: "Crack" cocaine.  FAMILY HISTORY:  Family History   Problem Relation Age of Onset   . Diabetes Brother         MyBrothe mother son granddauvte       ALLERGIES:  Allergies   Allergen Reactions   . Metformin Diarrhea   . Sulfa (Sulfonamide Antibiotics) Itching   . Latex Itching   . Penicillins Itching       ROS:   Review of Systems   Neurological:  Positive for dizziness and syncope.   All other systems reviewed and are negative.      PHYSICAL EXAM:   Visit Vitals  BP 116/80 (BP Location: Right upper arm, Patient Position: Sitting)   Pulse 93   Temp 98.2 F (36.8 C) (Oral)    Resp 16       Intake/Output Summary (Last 24 hours) at 10/18/2022 0820  Last data filed at 10/18/2022 0518  Gross per 24 hour   Intake 1828.22 ml   Output 750 ml   Net 1078.22 ml     Body mass index is 35.91 kg/m.   GENERAL: No acute distress  HEENT: NC/AT Head.  Conjunctiva normal without discharge. Nose externally grossly normal. Ears externally grossly normal.  MMM.  Neck supple.  CARDIAC: Regular S1 S2.  No S3.  No S4.  No murmurs. No rub. No heave. No click.  No JVD.  LUNGS: CTA B/L.  No wheeze.  No crackles.  No rhonchi.  Good effort.  ABDOMEN:  Soft. Nontender.  Nondistended.  Normal bowel sounds.   EXTREMITIES: No clubbing. No cyanosis. No edema.    SKIN:  No Rash.  Normal color.  Normal temperature.  M/S: FROM. No gross defect.  Ambulatory.  NEURO: A&O x 3.  Normal gross sensory and motor function.  PSYCH: Normal mood.  Normal affect.  Normal judgement.  VASCULAR: No carotid bruit.    Radial pulses 2+ b/l.  Pedal pulses 2+ b/l    LABS:  Results from last 3 days   Lab Units 10/18/22  0535 10/17/22  1832   SODIUM mmol/L  --  137   POTASSIUM mmol/L 4.9 3.1*   CHLORIDE mmol/L  --  106   CO2 mmol/L  --  25   BUN mg/dL  --  32*   CREATININE mg/dL  --  2.0*   GLUCOSE mg/dL  --  79   CALCIUM mg/dL  --  8.6   ,  Results from last 3 days   Lab Units 10/18/22  0535 10/18/22  0023 10/17/22  1832   TROPONIN I ng/mL 0.034* 0.036* 0.035*   ,   ,   Results from last 3 days   Lab Units 10/17/22  1832   ALK PHOS U/L 137*   BILIRUBIN TOTAL mg/dL 0.5   TOTAL PROTEIN g/dL 6.3   ALBUMIN g/dL 3.3*   ALT U/L 15   AST U/L 20   ,   Results from last 3 days   Lab Units 10/17/22  1832   WBC AUTO K/uL 8.42   HEMOGLOBIN g/dL 16.113.3   HEMATOCRIT % 41   PLATELETS AUTO K/uL 180       X-ray Chest 1 View  Narrative: CLINICAL INDICATION: Shortness of breath..    TECHNIQUE:  AP chest radiograph.    FINDINGS:    The heart size is within normal limits for an AP radiograph.  The mediastinal contours are unremarkable.  The lungs are clear, and the  costophrenic angles are sharp.  The osseous structures  of the thorax are intact.    Left subclavian cardiac pacer lead directed right ventricle. There is evidence of prior median sternotomy and CABG.  Impression: No radiographic evidence of acute cardiopulmonary disease.    Dictated: Belenda Cruise, MD On: 10/17/2022 7:18 PM  Electronically signed by: Belenda Cruise, MD On: 10/17/2022 7:19 PM  Transcribed:  CT Head wo IV Contrast  Narrative: Clinical indication: Syncope.    Technique:  Noncontrast axial CT images are acquired through the brain.    Findings:    There are no abnormal densities within the cerebral hemispheres, brainstem, or cerebellum.  There are no intra- or extra-axial masses or fluid collections.  The ventricles, sulci, and cisterns are normal in size, shape, and position for age.    The calvarium is unremarkable. The visualized paranasal sinuses are well pneumatized. The mastoid air cells and middle ear cavities are well pneumatized.    There are atherosclerotic calcifications of the intracranial segments of the right vertebral artery and the bilateral internal carotid arteries.    There is been no significant change compared with 10/31/2021 head CT.  Impression: 1. No acute intracranial hemorrhage, midline shift, or mass effect.  2. No CT evidence of acute intracranial abnormality.    Dictated: Belenda Cruise, MD On: 10/17/2022 6:56 PM  Electronically signed by: Belenda Cruise, MD On: 10/17/2022 7:03 PM  Transcribed:      DIAGNOSTIC CARDIOLOGY:   Encounter Date: 10/17/22   ECG 12 lead   Result Value    PR Interval 151    QRSD Interval 113    QT Interval 462    QTcB 544    QTcF 515    QRS Horizontal Axis -47    QRS Axis 12    Impression    Sinus rhythm  normal P axis, V-rate 50-99  Probable left atrial enlargement  P >9mS, <-0.65mV V1  Borderline intraventricular conduction delay  QRSd >17mS  Abnormal T, consider ischemia, diffuse leads  T <-0.33mV, ant/lat/inf  Prolonged QT interval  QTc >529mS        Rajwant K Dhillon, ACNPC-AG

## 2022-10-18 NOTE — Nursing Note (Signed)
Patient received discharge instructions and prescriptions. Patient states understanding of follow up care and side effects of medications. Patient has all belongings, IV removed, tele leads removed and vital signs stable. Patients daughter Amber Larsen is transport home and is said to arrive at 1700. Patient has no further questions or concerns at this time.

## 2022-10-18 NOTE — Discharge Summary (Signed)
Patient Name: Amber Larsen  Date of Birth: Apr 18, 1961  MRN: [id number]  [facility]ospital[facility]  Admission Date: 10/17/2022  Discharge Date: 10/18/2022  Inpatient Length of Stay: 1 days  Prior Hospitalization Within 30 Days? No      General Medicine Discharge Summary    Admitting Provider: Matthias HughsMehulkumar Roopareliya, MD  Discharge Provider: Glenford PeersSarah Nazir, MD    Primary Care Physician at Discharge: Heloise PurpuraJoseph Ferguson, MD  PCP Phone Number: [phone]     Discharge Disposition:  Home or Self Care  Discharge Planning       Discharge Planning Flowsheet                      Living Arrangements Alone   Do you have animals or pets at home? No    In the last 12 months, was there a time when you were not able to pay the mortgage or rent on time? N   Home or Post Acute Services None    In the last 12 months, was there a time when you did not have a steady place to sleep or slept in a shelter (including now)? N   Patient expects to be discharged to: home    Support Systems Children   In the past 12 months, has lack of transportation kept you from medical appointments or from getting medications? no    Assistance Needed no   In the past 12 months, has lack of transportation kept you from meetings, work, or from getting things needed for daily living? No    Type of Residence Private residence   Does the patient need discharge transport arranged? No                  Code Status at Discharge: Full Code    Discharge Diagnoses:  1. PRIMARY DISCHARGE DIAGNOSIS:  Hypotension from medications  2. SECONDARY DIAGNOSIS:  Diabetes mellitus type 2  Active Issues Requiring Follow-up:      Oxygen Requirement:      Active Central Lines:     Urethral Catheter:       Follow-Up Providers:  Follow Up with: Primary Care Provider  Earlean PolkaGregory John Kauffman, MD  [address]  [address]  [place] [id number]  [phone]    Schedule an appointment as soon as possible for a visit in 1 week(s)      Scheduled Referrals &  Follow-Ups:  No future appointments.      Hospital Course:      62 year old African American female with past medical history of diabetes mellitus type 2 CKD, CAD status post stents on antiplatelet CHF with EF of 45% presented to the ER today for hypoglycemia and syncope   As per patient she was taken off blood pressure medications but accidentally took them, while she was trying to check her blood sugar she felt lightheaded and passed out.  EMS was called who checked her blood sugars and it was in 500s range  Patient was brought to the ED  Of note patient recently lost her son as going through grief.    Patient was also found to be hypotensive was started on IV fluids, Cardiology was consulted   Patient had her ICD interrogated which did not show any acute events   Patient was briefly put on amiodarone drip for a regular rhythm which was taken off by Cardiology    Patient to be discharged on low-dose beta-blocker held all the blood pressure  medications   Patient follow with Cardiology and PCP outpatient                                      Last Labs:  Results from last 3 days   Lab Units 10/17/22  1832   WBC AUTO K/uL 8.42   HEMOGLOBIN g/dL 52.813.3   HEMATOCRIT % 41   MCV fL 92   PLATELETS AUTO K/uL 180     Results from last 3 days   Lab Units 10/18/22  0535   SODIUM mmol/L 134*   POTASSIUM mmol/L 4.9  4.9   CHLORIDE mmol/L 111*   CO2 mmol/L 19*   BUN mg/dL 36*   CREATININE mg/dL 2.0*   EGFR UX/LKG/4.01U*2mL/min/1.49m*2 28*   GLUCOSE mg/dL 725222*   CALCIUM mg/dL 8.6     Imaging / Other Studies:    All imaging and other studies were reviewed.    Test Results Pending at Discharge:         Medication List      CHANGE how you take these medications    . carvediloL 3.125 mg tablet; Commonly known as: Coreg; Take 1 tablet   (3.125 mg total) by mouth 2 (two) times a day with meals.; What changed:   medication strength, how much to take, when to take this     CONTINUE taking these medications    . acetaminophen 325 mg tablet; Commonly known  as: Tylenol; Take 2 tablets   (650 mg total) by mouth every 8 (eight) hours if needed for mild pain or   fever.  Marland Kitchen. albuterol HFA 90 mcg/actuation inhaler; Commonly known as: Proventil HFA  . aspirin 81 mg chewable tablet  . atorvastatin 20 mg tablet; Commonly known as: Lipitor; Take 1 tablet (20   mg total) by mouth every night.  . cyclobenzaprine 10 mg tablet; Commonly known as: Flexeril; Take 1 tablet   (10 mg total) by mouth 2 (two) times a day if needed for muscle spasms.  . ergocalciferol 1,250 mcg (50,000 unit) capsule; Commonly known as:   Vitamin D-2  . ezetimibe 10 mg tablet; Commonly known as: Zetia  . fluticasone HFA 110 mcg/actuation inhaler; Commonly known as: Flovent   HFA  . insulin glargine 100 unit/mL injection; Commonly known as: Lantus  . insulin lispro 100 unit/mL injection; Commonly known as: HumaLOG  . nitroGLYCERIN 0.4 mg SL tablet; Commonly known as: Nitrostat  . omeprazole 40 mg DR capsule; Commonly known as: PriLOSEC  . Ozempic 0.25 mg or 0.5 mg(2 mg/1.5 mL) injection; Generic drug:   semaglutide  . prasugreL 10 mg tablet; Commonly known as: Effient  . sodium bicarbonate 650 mg tablet  . Vascepa 1 gram capsule; Generic drug: icosapent ethyL     STOP taking these medications    . isosorbide mononitrate 30 mg 24 hr tablet; Commonly known as: Imdur  . olmesartan 40 mg tablet; Commonly known as: Benicar     Refer to the AVS for the most up-to-date medication list and instructions.    DETAILS OF HOSPITAL STAY    Presenting Problem/History of Present Illness:  Hypokalemia [E87.6]  Ventricular tachycardia (CMS-HCC) [I47.20]  Elevated troponin [R79.89]  AKI (acute kidney injury) (CMS-HCC) [N17.9]  Elevated brain natriuretic peptide (BNP) level [R79.89]  Hypotension, unspecified hypotension type [I95.9]  Syncope, unspecified syncope type [R55]    Operative Procedures Performed:    Consults:   IP CONSULT  TO CARDIOLOGY    Physical Exam at Discharge:  Vital Signs:  Pulse: 86  Resp: 16  BP:  123/79  Temp: 98.1 F (36.7 C)  Weight: 209 lb 3.2 oz (94.9 kg)  Constitutional: general appearance is grossly normal, NAD  Neck: soft,supple  Heart:  Rate Controlled Lungs: CTABL, no conversational dyspnea  Abdomen: soft, non tender.   Extremities: no edema  Neurologic: AAOX3, no focal deficits        Discharge Condition: stable    Patient had a more rapid recovery than expected.    I personally spent over 30 minutes in the discharge process.          SIGNATURE:  Glenford Peers, MD  DATE:  October 18, 2022  TIME:  3:39 PM

## 2022-10-24 ENCOUNTER — Ambulatory Visit (INDEPENDENT_AMBULATORY_CARE_PROVIDER_SITE_OTHER): Payer: Medicare PPO | Admitting: Internal Medicine

## 2022-12-20 ENCOUNTER — Other Ambulatory Visit: Payer: Self-pay

## 2022-12-20 ENCOUNTER — Ambulatory Visit
Payer: Medicare PPO | Attending: Student in an Organized Health Care Education/Training Program | Admitting: Student in an Organized Health Care Education/Training Program

## 2022-12-20 ENCOUNTER — Other Ambulatory Visit (HOSPITAL_BASED_OUTPATIENT_CLINIC_OR_DEPARTMENT_OTHER): Payer: Medicare PPO

## 2022-12-20 VITALS — BP 130/80 | HR 120 | Ht 64.0 in | Wt 214.5 lb

## 2022-12-20 DIAGNOSIS — M899 Disorder of bone, unspecified: Secondary | ICD-10-CM | POA: Insufficient documentation

## 2022-12-20 DIAGNOSIS — N179 Acute kidney failure, unspecified: Secondary | ICD-10-CM | POA: Insufficient documentation

## 2022-12-20 DIAGNOSIS — N1832 Chronic kidney disease, stage 3b (CMS HCC): Secondary | ICD-10-CM

## 2022-12-20 DIAGNOSIS — N189 Chronic kidney disease, unspecified: Secondary | ICD-10-CM | POA: Insufficient documentation

## 2022-12-20 DIAGNOSIS — R809 Proteinuria, unspecified: Secondary | ICD-10-CM | POA: Insufficient documentation

## 2022-12-20 DIAGNOSIS — E669 Obesity, unspecified: Secondary | ICD-10-CM | POA: Insufficient documentation

## 2022-12-20 DIAGNOSIS — E1122 Type 2 diabetes mellitus with diabetic chronic kidney disease: Secondary | ICD-10-CM

## 2022-12-20 DIAGNOSIS — I129 Hypertensive chronic kidney disease with stage 1 through stage 4 chronic kidney disease, or unspecified chronic kidney disease: Secondary | ICD-10-CM

## 2022-12-20 LAB — URINALYSIS, MACROSCOPIC
BILIRUBIN: NEGATIVE mg/dL
BLOOD: NEGATIVE mg/dL
COLOR: NORMAL
GLUCOSE: >=1000 mg/dL — AB
KETONES: NEGATIVE mg/dL
LEUKOCYTES: NEGATIVE WBCs/uL
NITRITE: NEGATIVE
PH: 5 (ref 5.0–8.0)
PROTEIN: 70 mg/dL — AB
SPECIFIC GRAVITY: 1.015 (ref 1.005–1.030)
UROBILINOGEN: NEGATIVE mg/dL

## 2022-12-20 LAB — RENAL FUNCTION PANEL
ALBUMIN: 3 g/dL — ABNORMAL LOW (ref 3.5–5.0)
ANION GAP: 6 mmol/L (ref 4–13)
BUN/CREA RATIO: 15 (ref 6–22)
BUN: 35 mg/dL — ABNORMAL HIGH (ref 8–25)
CALCIUM: 9.1 mg/dL (ref 8.6–10.2)
CHLORIDE: 109 mmol/L (ref 96–111)
CO2 TOTAL: 24 mmol/L (ref 22–30)
CREATININE: 2.3 mg/dL — ABNORMAL HIGH (ref 0.60–1.05)
ESTIMATED GFR - FEMALE: 24 mL/min/BSA — ABNORMAL LOW (ref >=60)
GLUCOSE: 187 mg/dL — ABNORMAL HIGH (ref 65–125)
PHOSPHORUS: 3.3 mg/dL (ref 2.4–4.7)
POTASSIUM: 4.2 mmol/L (ref 3.5–5.1)
SODIUM: 139 mmol/L (ref 136–145)

## 2022-12-20 LAB — URINALYSIS, MICROSCOPIC
HYALINE CASTS: 2 /lpf (ref <4.0)
RBCS: 1 /hpf (ref <6.0)
WBCS: 2 /hpf (ref <11.0)

## 2022-12-20 LAB — PROTEIN/CREATININE RATIO, URINE, RANDOM
CREATININE RANDOM URINE: 69 mg/dL
PROTEIN RANDOM URINE: 75 mg/dL
PROTEIN/CREATININE RATIO RANDOM URINE: 1087 mg/g — ABNORMAL HIGH (ref 10–105)

## 2022-12-20 LAB — CBC WITH DIFF
BASOPHIL #: <0.1 10*3/uL (ref <=0.20)
BASOPHIL %: 0.2 %
EOSINOPHIL #: <0.1 10*3/uL (ref <=0.50)
EOSINOPHIL %: 0.4 %
HCT: 42.6 % (ref 34.8–46.0)
HGB: 13 g/dL (ref 11.5–16.0)
IMMATURE GRANULOCYTE #: <0.1 10*3/uL (ref <0.10)
IMMATURE GRANULOCYTE %: 0.3 % (ref 0.0–1.0)
LYMPHOCYTE #: 2.53 10*3/uL (ref 1.00–4.80)
LYMPHOCYTE %: 19.3 %
MCH: 27.6 pg (ref 26.0–32.0)
MCHC: 30.5 g/dL — ABNORMAL LOW (ref 31.0–35.5)
MCV: 90.4 fL (ref 78.0–100.0)
MONOCYTE #: 1.03 10*3/uL (ref 0.20–1.10)
MONOCYTE %: 7.9 %
NEUTROPHIL #: 9.41 10*3/uL — ABNORMAL HIGH (ref 1.50–7.70)
NEUTROPHIL %: 71.9 %
PLATELETS: 227 10*3/uL (ref 150–400)
RBC: 4.71 10*6/uL (ref 3.85–5.22)
RDW-CV: 15.4 % (ref 11.5–15.5)
WBC: 13.1 10*3/uL — ABNORMAL HIGH (ref 3.7–11.0)

## 2022-12-20 LAB — MICROALBUMIN/CREATININE RATIO, URINE, RANDOM
CREATININE RANDOM URINE: 69 mg/dL
MICROALBUMIN RANDOM URINE: 54.1 mg/dL
MICROALBUMIN/CREATININE RATIO RANDOM URINE: 784.1 mg/g — ABNORMAL HIGH (ref <30.0)

## 2022-12-20 NOTE — Patient Instructions (Signed)
I will call you with results of your labs from today   If you are not able to get in to see someone today for your shortness of breath and cough, I would recommend to go to the ER. You may need an ultrasound of the heart to evaluate your heart.   Follow up in 4 months. Labs prior to visit.

## 2022-12-20 NOTE — Progress Notes (Signed)
[facility]   Nephrology Subsequent Office Visit Note    [name], [name] 62 y.o. female  Date of Birth:  [birth year]  Date of service: 12/21/2022      REASON FOR VISIT: Follow up for CKD      PROBLEM LIST:  Type 2 DM since at least 40 years , poorly controlled for several years (recently A1c has been in 7-8%) c/b retinopathy, neuropathy.   HTN since at least 30 years, poorly controlled for several years   Multivessel CAD s/p 4-vessel CABG in 2017, f/b failed grafts subsequently placed cardiac defibrillator in 2021  Ischemic cardiomyopathy with HFrEF (EF 45-50% on TTE from 03/20/2022) on GDMT  Bilateral cataracts s/p extraction   Glaucoma   Asthma   Generalized anxiety disorder   HLD   Obesity   Non toxic multinodular goiter   Major depression   GERD on chronic PPI   Pulmonary embolism (in 2017 and in 2022)- unclear reason   Tubal ligation   Rotator cuff repair       HPI obtained from initial consult note on 08/11/22:   Amber Larsen is a 62 y.o. female who is here for follow up of CKD. She was initially seen in our clinic on 08/11/2022.     Patient was initially living in [place], then lived in [place] before recently moving to [place]. She was following up with nephrology in [facility] and in [place]. Last office visit with Dr. Jomarie Longs was in 4/23.   Patient has evidence of CKD since at least 2020 but could have been longer (we don't have older records on file). Serum Cr has been stable between 1.5 - 1.8 mg/dl with peak Cr of 2.2 at one time in 4/21 when she had a syncopal event from hypotension. Cr in 4/23 was 1.7 which was baseline.     Of note, patient does have a long standing history of poorly controlled DM  c/b neuropathy, retinopathy, (A1c previously used to be 17%, improved now and down to 7-8%). She did not have access to healthcare for several years until recently (few years ago). She also has a long standing h/o HTN, and vascular disease (CAD s/p quadruple bypass in 2017. Early in 2023, she presented to the ER after  passing out and was found to be hypotensive. Coreg and amlodipine were discontinued. She ws briefly on midodrine but this has been discontinued. She has lost about 30 lbs in the past year, on Ozempic. She has tolerated it well so far.       INTERVAL HISTORY:   She was last seen in renal clinic on 08/11/22. In the interim, she was admitted at [facility] from 10/17/22- 10/18/22 with syncope. She was taken off of anti hypertensive medications but accidentally took them and felt lightheaded and passed out. Upon EMS arrival, blood sugars were in 500's. She was hypotensive on admission (BP 98/69) and received IVFs.  ICD was interrogated by cardiology which did not show acute events. She was briefly on amio gtt but was subsequently taken off of it. Labs remarkable for serum Cr of 2.0 (on 10/18/22), bicarb 19, K 4.9, serum Na 134, glucose 222. Hb 13. She was discharged with low dose coreg 3.125 mg BID. Imdur 30 mg  was discontinued and Olmesartan 40 mg was held until renal function improved. Patient had poor PO intake due to recent demise of her son (passed away at [facility] post heart transplant)     Today, she feels unwell. She  is coughing and appears sick. She tells me that she has had a cough and progressively worsening DOE for about a month now. She has visited a walk in clinic twice, she was initially treated with tessalon pearls which did not help. Recently she again visited to the walk in clinic because of ongoing symptoms. She says she had  CXR done and was told she had "fluid in her lungs". She was given a 10 day course of prednisone taper (started on 12/08/22). Flu and COVID test were negative (per patient).  ROS positive for orthopnea+ and negative for fever, chills, leg swelling, N/V/D, weight gain. No sick contacts. She has a PCP but does not believe she can get in to see her soon, but has also not contacted her PCP regarding this. Home BP ranges in 140's/90's. She does not recollect her home medications. She  also does not recollect being discharged with coreg and losartan being held. Unable to complete a med rec because she does not know her home meds.       ALLERGIES:  Allergies   Allergen Reactions    Metformin Mental Status Effect    Sulfa (Sulfonamides)     Latex Itching         MEDICATIONS:     Current Outpatient Medications:     amitriptyline (ELAVIL) 10 mg Oral Tablet, Take 1 Tablet (10 mg total) by mouth Every night, Disp: , Rfl:     aspirin (ECOTRIN) 81 mg Oral Tablet, Delayed Release (E.C.), Take 1 Tablet (81 mg total) by mouth, Disp: , Rfl:     atorvastatin (LIPITOR) 20 mg Oral Tablet, Take 1 Tablet (20 mg total) by mouth Twice daily, Disp: , Rfl:     Bifidobacterium Infantis (ALIGN) 4 mg Oral Capsule, Take 1 Capsule (4 mg total) by mouth, Disp: , Rfl:     bumetanide (BUMEX) 1 mg Oral Tablet, Take 1 Tablet (1 mg total) by mouth Twice daily, Disp: , Rfl:     citalopram (CELEXA) 40 mg Oral Tablet, , Disp: , Rfl:     clobetasoL (TEMOVATE) 0.05 % Cream, , Disp: , Rfl:     DULoxetine (CYMBALTA DR) 20 mg Oral Capsule, Delayed Release(E.C.), Take 1 Capsule (20 mg total) by mouth, Disp: , Rfl:     ergocalciferol, vitamin D2, (DRISDOL) 1,250 mcg (50,000 unit) Oral Capsule, Take 1 Capsule (50,000 Units total) by mouth, Disp: , Rfl:     ezetimibe (ZETIA) 10 mg Oral Tablet, Take 1 Tablet (10 mg total) by mouth Once a day, Disp: , Rfl:     famotidine (PEPCID) 40 mg Oral Tablet, , Disp: , Rfl:     JARDIANCE 25 mg Oral Tablet, , Disp: , Rfl:     omeprazole (PRILOSEC) 40 mg Oral Capsule, Delayed Release(E.C.), Take 1 Capsule (40 mg total) by mouth, Disp: , Rfl:     OZEMPIC 0.25 mg or 0.5 mg (2 mg/3 mL) Subcutaneous Pen Injector, , Disp: , Rfl:     prasugreL (EFFIENT) 10 mg Oral Tablet, , Disp: , Rfl:     sodium bicarbonate 650 mg Oral Tablet, Take by mouth Every 12 hours, Disp: , Rfl:     VASCEPA 1 gram Oral Capsule, Take 2 Capsules (2 g total) by mouth, Disp: , Rfl:         REVIEW OF SYSTEMS:   All other systems are  negative      PHYSICAL EXAM  Vitals:    12/20/22 1246   BP: 130/80   Pulse: Marland Kitchen(!)  120   SpO2: 95%   Weight: 97.3 kg (214 lb 8.1 oz)   Height: 1.626 m (5\' 4" )   BMI: 36.9          GENERAL: A very pleasant woman in mild distress. Frequently coughing. Conversational dyspnea+  HEENT: Sclerae anicteric. Mucus membranes pink and moist. Oropharynx clear.   HEART: Regular rhythm and rate, S1 S2 normal, no rub or gallop. +systolic murmur loudest on left 2 nd ICS. No JVD.   LUNGS: no crackles. +coarse breath sounds b/l   ABDOMEN: Soft, non tender, non distended, bowel sounds normal. No hepatosplenomegaly or masses. No abdominal bruits.   EXTREMITIES: No edema.   SKIN: No rash   NEURO: Alert and oriented x 3. No focal motor deficits.   PSYCH: Pleasant, cooperative, in no apparent distress  MSK: No joint swelling or tenderness.         LABS  I have reviewed the following labs.  Recent Labs     08/11/22  1237 12/20/22  1336   SODIUM 141 139   POTASSIUM 4.5 4.2   CHLORIDE 107 109   CO2 26 24   BUN 24 35*   CREATININE 1.95* 2.30*   GFR 29* 24*   ANIONGAP 8 6     Recent Labs     08/11/22  1237 12/20/22  1336   CALCIUM 9.9 9.1   ALBUMIN 3.5 3.0*   MAGNESIUM 2.1  --    PHOSPHORUS 3.7 3.3     Recent Labs     08/11/22  1237 12/20/22  1336   WBC 7.6 13.1*   HGB 14.8 13.0   HCT 47.6* 42.6   PLTCNT 179 227       Urine Dip  LEUKOCYTES   Date Value Ref Range Status   12/20/2022 Negative Negative WBCs/uL Final     NITRITE   Date Value Ref Range Status   12/20/2022 Negative Negative Final     UROBILINOGEN   Date Value Ref Range Status   12/20/2022 Negative Negative mg/dL Final     PROTEIN   Date Value Ref Range Status   12/20/2022 70 (A) Negative mg/dL Final     BILIRUBIN   Date Value Ref Range Status   12/20/2022 Negative Negative mg/dL Final     GLUCOSE   Date Value Ref Range Status   12/20/2022 187 (H) 65 - 125 mg/dL Final   60/45/4098 >=1191 (A) Negative mg/dL Final         Lab Results   Component Value Date/Time    CREATURRAN 69 12/20/2022  01:36 PM    CREATURRAN 69 12/20/2022 01:36 PM     Lab Results   Component Value Date/Time    PROTEINUR 75 12/20/2022 01:36 PM        PTH   Date Value Ref Range Status   08/11/2022 322.2 (H) 8.5 - 77.0 pg/mL Final     No results found for: "25HYDVITD2", "25HYDVITD3", "25HYVITD2D3"      Labs from care everywhere: (last nephrology note from Dr. Jomarie Longs, 4/23)        Recent labs from 10/18/2022 at [facility]:   Cr 2.0, bicarb 19, Na 134, K 4.9, glucose 222, BUN 36, calcium 8.6     IMAGING:     CT abd pelvis: 03/18/22:           ASSESSMENT AND PLAN:    Chronic kidney disease, stage 3, likely secondary to long standing poorly controlled DM and HTN:  Evidence of  CKD since at least 2020 but likely has been longer (we don't have previous records).   B/l Cr 1.5 -1.8. Cr was 1.7 in 4/23 and 1.9 in 12/23. Most recent serum Cr from recent hospitalization in 2/24 was 2.0 mg/dl.   Etiology of CKD is most likely long standing poorly controlled DM (given that she does have proteinuria), long standing HTN. Other risk factors and Ddx include obesity associated CKD and possibly FSGS.   We discussed importance of risk factor management in order to delay progression of kidney disease. She did not get repeat labs prior to visit today. We will repeat labs today. We will monitor renal function.   Obtain renal US (she has not scheduled it yet).     2. Hypertension:   BP is slightly above goal in office today. She is in the process of being set up with telehealth for BP monitoring. Early in 2023, she had episode of syncope from hypotension, amlodipine and coreg (12.5 BID) were discontinued at that time. She is not aware of her home medications. On recent admission in 2/24, coreg was decreased to 3.125 and olmesartan was held on discharge, Imdur was d/ced but patient seems unclear about this.   We will not make any changes today. Will FU on labs from today. She will need complete med rec and will need to bring her pill bottles each  time during FU visits.     3. Sub-nephrotic range Proteinuria likely secondary to diabetes:   She has evidence of proteinuria since at least 2020 but could have been longer. UPC has mostly ranged between 0.2 - 0.5 gm/gm Cr including most recent UPC from 08/11/22 which was 0.5 g/g Cr and ACR was 0.3 g/g Cr   Most likely 2/2 DM   We will repeat UPC, ACR today. She is on SGLT2i (not sure if she is taking it). We will not make any changes today.     4. Anemia of chronic kidney disease:   Hb is WNL.   We will repeat CBC today and if indicated will obtain iron studies.     5. Secondary hyperparathyroidism:   Vit D was 38 in 3/22 and iPTH was 66 in 8/22. Most recent iPTH from 08/11/22 was 322, higher than expected for the degree of CKD.   She is on calcium and vit D supplementation. We will not make any changes today. We will monitor iPTH every 6 months.     6. Volume overload/Edema:   She appears euvolemic today.  She is on bumex 1 mg BID (unclear if she is taking it)     7. Metabolic acidosis:   Most recent serum bicarb level was 19 in 2/24. She is on PO bicarb tabs.   We will repeat labs today and reassess.     8. SOB, Cough:   She appears in mild respiratory distress. Prednisone taper has not helped her symptoms. Does not appears to be an asthma exacerbation. Less likely bacterial PNA. COVID-19 and Flu were neg. She also c/o orthopnea. Given h/o CHF, and murmur on exam, advised that she follows up with PCP as soon as possible. Patient says she will reach out to PCP or might go back to the walk in clinic. I advised her that if she does not feel better by tomorrow or if she is not able to get evaluated in time, that she should go to the ED for evaluation. May need TTE to reevaluate cardiac function and for any VHD, and  CXR.       DISPOSITION: The patient will RTC in 4 months  or earlier if indicated. In the interim, we will communicate with her over the phone regarding the results of above mentioned tests.     The patient  was seen independently.       Orders Placed This Encounter    RENAL FUNCTION PANEL    URINALYSIS, MACROSCOPIC AND MICROSCOPIC    PROTEIN/CREATININE RATIO, URINE, RANDOM    Microalbumin/Creatinine Ratio, Urine, Random    CBC/DIFF    RENAL FUNCTION PANEL    URINALYSIS, MACROSCOPIC AND MICROSCOPIC    PROTEIN/CREATININE RATIO, URINE, RANDOM    MICROALBUMIN/CREATININE RATIO, URINE, RANDOM        Pennie Banter, MD   Assistant Professor  Department of Medicine, Section of Nephrology   [facility]

## 2022-12-21 ENCOUNTER — Other Ambulatory Visit (HOSPITAL_BASED_OUTPATIENT_CLINIC_OR_DEPARTMENT_OTHER): Payer: Self-pay | Admitting: Student in an Organized Health Care Education/Training Program

## 2022-12-21 DIAGNOSIS — N179 Acute kidney failure, unspecified: Secondary | ICD-10-CM

## 2022-12-21 NOTE — Progress Notes (Incomplete)
[facility]   Nephrology Subsequent Office Visit Note    Amber Larsen, Amber Larsen 62 y.o. female  Date of Birth:  [birth year]  Date of service: 12/21/2022      REASON FOR VISIT: Follow up for CKD      PROBLEM LIST:  Type 2 DM since at least 40 years , poorly controlled for several years (recently A1c has been in 7-8%) c/b retinopathy, neuropathy.   HTN since at least 30 years, poorly controlled for several years   Multivessel CAD s/p 4-vessel CABG in 2017, f/b failed grafts subsequently placed cardiac defibrillator in 2021  Ischemic cardiomyopathy with HFrEF (EF 45-50% on TTE from 03/20/2022) on GDMT  Bilateral cataracts s/p extraction   Glaucoma   Asthma   Generalized anxiety disorder   HLD   Obesity   Non toxic multinodular goiter   Major depression   GERD on chronic PPI   Pulmonary embolism (in 2017 and in 2022)- unclear reason   Tubal ligation   Rotator cuff repair       HPI obtained from initial consult note on 08/11/22:   Amber Larsen is a 62 y.o. female who is here for follow up of CKD. She was initially seen in our clinic on 08/11/2022.     Patient was initially living in [place], then lived in [place] before recently moving to [place]. She was following up with nephrology in [facility] and in [place]. Last office visit with Dr. Jomarie Longs was in 4/23.   Patient has evidence of CKD since at least 2020 but could have been longer (we [place] have older records on file). Serum Cr has been stable between 1.5 - 1.8 mg/dl with peak Cr of 2.2 at one time in 4/21 when she had a syncopal event from hypotension. Cr in 4/23 was 1.7 which was baseline.     Of note, patient does have a long standing history of poorly controlled DM  c/b neuropathy, retinopathy, (A1c previously used to be 17%, improved now and down to 7-8%). She did not have access to healthcare for several years until recently (few years ago). She also has a long standing h/o HTN, and vascular disease (CAD s/p quadruple bypass in 2017. Early in 2023, she presented to the ER after  passing out and was found to be hypotensive. Coreg and amlodipine were discontinued. She ws briefly on midodrine but this has been discontinued. She has lost about 30 lbs in the past year, on Ozempic. She has tolerated it well so far.       INTERVAL HISTORY:   She was last seen in renal clinic on 08/11/22. In the interim, she was admitted at [facility] from 10/17/22- 10/18/22 with syncope. She was taken off of anti hypertensive medications but accidentally took them and felt lightheaded and passed out. Upon EMS arrival, blood sugars were in 500's. She was hypotensive on admission (BP 98/69) and received IVFs.  ICD was interrogated by cardiology which did not show acute events. She was briefly on amio gtt but was subsequently taken off of it. Labs remarkable for serum Cr of 2.0 (on 10/18/22), bicarb 19, K 4.9, serum Na 134, glucose 222. Hb 13. She was discharged with low dose coreg 3.125 mg BID. Imdur 30 mg  was discontinued and Olmesartan 40 mg was held until renal function improved. Patient had poor PO intake due to recent demise of her son (passed away at [facility] post heart transplant)     Today, she feels unwell. She  is coughing and appears sick. She tells me that she has had a cough and progressively worsening DOE for about a month now. She has visited a walk in clinic twice, she was initially treated with tessalon pearls which did not help. Recently she again visited to the walk in clinic because of ongoing symptoms. She says she had  CXR done and was told she had "fluid in her lungs". She was given a 10 day course of prednisone taper (started on 12/08/22). Flu and COVID test were negative (per patient).  ROS positive for orthopnea+ and negative for fever, chills, leg swelling, N/V/D, weight gain. No sick contacts. She has a PCP but does not believe she can get in to see her soon, but has also not contacted her PCP regarding this. Home BP ranges in 140's/90's. She does not recollect her home medications. She  also does not recollect being discharged with coreg and losartan being held. Unable to complete a med rec because she does not know her home meds.       ALLERGIES:  Allergies   Allergen Reactions   . Metformin Mental Status Effect   . Sulfa (Sulfonamides)    . Latex Itching         MEDICATIONS:     Current Outpatient Medications:   .  amitriptyline (ELAVIL) 10 mg Oral Tablet, Take 1 Tablet (10 mg total) by mouth Every night, Disp: , Rfl:   .  aspirin (ECOTRIN) 81 mg Oral Tablet, Delayed Release (E.C.), Take 1 Tablet (81 mg total) by mouth, Disp: , Rfl:   .  atorvastatin (LIPITOR) 20 mg Oral Tablet, Take 1 Tablet (20 mg total) by mouth Twice daily, Disp: , Rfl:   .  Bifidobacterium Infantis (ALIGN) 4 mg Oral Capsule, Take 1 Capsule (4 mg total) by mouth, Disp: , Rfl:   .  bumetanide (BUMEX) 1 mg Oral Tablet, Take 1 Tablet (1 mg total) by mouth Twice daily, Disp: , Rfl:   .  citalopram (CELEXA) 40 mg Oral Tablet, , Disp: , Rfl:   .  clobetasoL (TEMOVATE) 0.05 % Cream, , Disp: , Rfl:   .  DULoxetine (CYMBALTA DR) 20 mg Oral Capsule, Delayed Release(E.C.), Take 1 Capsule (20 mg total) by mouth, Disp: , Rfl:   .  ergocalciferol, vitamin D2, (DRISDOL) 1,250 mcg (50,000 unit) Oral Capsule, Take 1 Capsule (50,000 Units total) by mouth, Disp: , Rfl:   .  ezetimibe (ZETIA) 10 mg Oral Tablet, Take 1 Tablet (10 mg total) by mouth Once a day, Disp: , Rfl:   .  famotidine (PEPCID) 40 mg Oral Tablet, , Disp: , Rfl:   .  JARDIANCE 25 mg Oral Tablet, , Disp: , Rfl:   .  omeprazole (PRILOSEC) 40 mg Oral Capsule, Delayed Release(E.C.), Take 1 Capsule (40 mg total) by mouth, Disp: , Rfl:   .  OZEMPIC 0.25 mg or 0.5 mg (2 mg/3 mL) Subcutaneous Pen Injector, , Disp: , Rfl:   .  prasugreL (EFFIENT) 10 mg Oral Tablet, , Disp: , Rfl:   .  sodium bicarbonate 650 mg Oral Tablet, Take by mouth Every 12 hours, Disp: , Rfl:   .  VASCEPA 1 gram Oral Capsule, Take 2 Capsules (2 g total) by mouth, Disp: , Rfl:         REVIEW OF SYSTEMS:   All other  systems are negative      PHYSICAL EXAM  Vitals:    12/20/22 1246   BP: 130/80   Pulse: Marland Kitchen)  120   SpO2: 95%   Weight: 97.3 kg (214 lb 8.1 oz)   Height: 1.626 m (5\' 4" )   BMI: 36.9          GENERAL: A very pleasant woman in mild distress. Frequently coughing. Conversational dyspnea+  HEENT: Sclerae anicteric. Mucus membranes pink and moist. Oropharynx clear.   HEART: Regular rhythm and rate, S1 S2 normal, no rub or gallop. +systolic murmur loudest on left 2 nd ICS. No JVD.   LUNGS: no crackles. +coarse breath sounds b/l   ABDOMEN: Soft, non tender, non distended, bowel sounds normal. No hepatosplenomegaly or masses. No abdominal bruits.   EXTREMITIES: No edema.   SKIN: No rash   NEURO: Alert and oriented x 3. No focal motor deficits.   PSYCH: Pleasant, cooperative, in no apparent distress  MSK: No joint swelling or tenderness.         LABS  I have reviewed the following labs.  Recent Labs     08/11/22  1237 12/20/22  1336   SODIUM 141 139   POTASSIUM 4.5 4.2   CHLORIDE 107 109   CO2 26 24   BUN 24 35*   CREATININE 1.95* 2.30*   GFR 29* 24*   ANIONGAP 8 6     Recent Labs     08/11/22  1237 12/20/22  1336   CALCIUM 9.9 9.1   ALBUMIN 3.5 3.0*   MAGNESIUM 2.1  --    PHOSPHORUS 3.7 3.3     Recent Labs     08/11/22  1237 12/20/22  1336   WBC 7.6 13.1*   HGB 14.8 13.0   HCT 47.6* 42.6   PLTCNT 179 227       Urine Dip  LEUKOCYTES   Date Value Ref Range Status   12/20/2022 Negative Negative WBCs/uL Final     NITRITE   Date Value Ref Range Status   12/20/2022 Negative Negative Final     UROBILINOGEN   Date Value Ref Range Status   12/20/2022 Negative Negative mg/dL Final     PROTEIN   Date Value Ref Range Status   12/20/2022 70 (A) Negative mg/dL Final     BILIRUBIN   Date Value Ref Range Status   12/20/2022 Negative Negative mg/dL Final     GLUCOSE   Date Value Ref Range Status   12/20/2022 187 (H) 65 - 125 mg/dL Final   74/25/9563 >=8756 (A) Negative mg/dL Final         Lab Results   Component Value Date/Time    CREATURRAN  69 12/20/2022 01:36 PM    CREATURRAN 69 12/20/2022 01:36 PM     Lab Results   Component Value Date/Time    PROTEINUR 75 12/20/2022 01:36 PM        PTH   Date Value Ref Range Status   08/11/2022 322.2 (H) 8.5 - 77.0 pg/mL Final     No results found for: "25HYDVITD2", "25HYDVITD3", "25HYVITD2D3"      Labs from care everywhere: (last nephrology note from Dr. Jomarie Longs, 4/23)        Recent labs from 10/18/20[facility] at [facility]:   Cr 2.0, bicarb 19, Na 134, K 4.9, glucose 222, BUN 36, calcium 8.6     IMAGING:     CT abd pelvis: 03/18/22:           ASSESSMENT AND PLAN:    Chronic kidney disease, stage 3, likely secondary to long standing poorly controlled DM and HTN:  Evidence of  CKD since at least 2020 but likely has been longer (we don'[place] have previous records).   B/l Cr 1.5 -1.8. Cr was 1.7 in 4/23 and 1.9 in 12/23. Most recent serum Cr from recent hospitalization in 2/24 was 2.0 mg/dl.   Etiology of CKD is most likely long standing poorly controlled DM (given that she does have proteinuria), long standing HTN. Other risk factors and Ddx include obesity associated CKD and possibly FSGS.   We discussed importance of risk factor management in order to delay progression of kidney disease. She did not get repeat labs prior to visit today. We will repeat labs today. We will monitor renal function.   Obtain renal US (she has not got    2. Hypertension:   BP at goal in office today. She is in the process of being set up with telehealth for BP monitoring. Given recent episode of syncope from hypotension, amlodipine and coreg (12.5 BID) were discontinued.   Currently she is on Olmesartan 40 mg daily.   We will not make any changes today.     3. Sub-nephrotic range Proteinuria likely secondary to diabetes:   She has evidence of proteinuria since at least 2020 but could have been longer. UPC has mostly ranged between 0.2 - 0.5 gm/gm Cr.   Most likely 2/2 DM   We will repeat UPC, ACR today. She is on SGLT2i. We will not make  any changes today.     4. Anemia of chronic kidney disease:   Most recent available Hb (on records) is 12, WNL from 8/22.   We will repeat CBC today and if indicated will obtain iron studies.     5. Secondary hyperparathyroidism:   Vit D was 38 in 3/22 and iPTH was 66 in 8/22.   We will obtain labs today and repeat iPTH and vit D today. She is on calcium and vit D supplementation. We will not make any changes today.     6. Volume overload/Edema:   She appears euvolemic today. She will continue bumex 1 mg BID.     7. Metabolic acidosis:   Most recent serum bicarb level was 17 in 4/23. She is currently on PO bicarb TID, unclear of the dose. Based on last nephrology note, PO bicarb was increased to 1300 TID.   We will repeat labs today and reassess.       DISPOSITION: The patient will RTC in ***  or earlier if indicated. In the interim, we will communicate with *** over the phone regarding the results of above mentioned tests.     The patient was seen independently.       Orders Placed This Encounter   . RENAL FUNCTION PANEL   . URINALYSIS, MACROSCOPIC AND MICROSCOPIC   . PROTEIN/CREATININE RATIO, URINE, RANDOM   . Microalbumin/Creatinine Ratio, Urine, Random   . CBC/DIFF   . RENAL FUNCTION PANEL   . URINALYSIS, MACROSCOPIC AND MICROSCOPIC   . PROTEIN/CREATININE RATIO, URINE, RANDOM   . MICROALBUMIN/CREATININE RATIO, URINE, RANDOM        Pennie Banterjaswi Adham Johnson, MD   Assistant Professor  Department of Medicine, Section of Nephrology   [facility] [employer]         LABS  I have reviewed the following labs.      DISPOSITION: The patient will RTC in 4 months  or earlier if indicated. In the interim, we will communicate with her over the phone regarding the results of above mentioned tests. She lives in  [place], we will try to arrange future visits at [facility] CKD clinic (after her first FU appointment).     The patient was seen independently.       Orders Placed This Encounter   . RENAL FUNCTION PANEL   .  URINALYSIS, MACROSCOPIC AND MICROSCOPIC   . PROTEIN/CREATININE RATIO, URINE, RANDOM   . Microalbumin/Creatinine Ratio, Urine, Random   . CBC/DIFF   . RENAL FUNCTION PANEL   . URINALYSIS, MACROSCOPIC AND MICROSCOPIC   . PROTEIN/CREATININE RATIO, URINE, RANDOM   . MICROALBUMIN/CREATININE RATIO, URINE, RANDOM        Pennie Banter, MD   Assistant Professor  Department of Medicine, Section of Nephrology   [facility]

## 2022-12-21 NOTE — Result Encounter Note (Signed)
Called patient to discuss lab results.  She did not answer.  Left a voicemail to call us back.  Renal function has worsened, creatinine up to 2.3 from baseline of 1.8.  She was unsure of what medications she was on.    Joni Reining,  Continue follow-up with her tomorrow and ask her what medications she is taking at home (Please go over her pill bottles).  If she is taking losartan or lisinopril, please have her stop taking it for now and repeat renal function panel on Monday.    Thanks   Pennie Banter, MD  Nephrology

## 2023-01-11 ENCOUNTER — Ambulatory Visit (INDEPENDENT_AMBULATORY_CARE_PROVIDER_SITE_OTHER): Payer: Medicare PPO | Admitting: Internal Medicine

## 2023-02-06 ENCOUNTER — Ambulatory Visit (INDEPENDENT_AMBULATORY_CARE_PROVIDER_SITE_OTHER): Payer: Self-pay | Admitting: Cardiovascular Disease

## 2023-04-18 ENCOUNTER — Ambulatory Visit (HOSPITAL_BASED_OUTPATIENT_CLINIC_OR_DEPARTMENT_OTHER): Payer: Self-pay | Admitting: Student in an Organized Health Care Education/Training Program

## 2023-05-08 ENCOUNTER — Ambulatory Visit (INDEPENDENT_AMBULATORY_CARE_PROVIDER_SITE_OTHER): Payer: Self-pay | Admitting: Physician Assistant

## 2023-05-15 ENCOUNTER — Encounter (INDEPENDENT_AMBULATORY_CARE_PROVIDER_SITE_OTHER): Payer: Self-pay

## 2024-04-08 NOTE — Telephone Encounter (Signed)
 SABRA
